# Patient Record
Sex: Male | Born: 1950 | Race: Black or African American | Hispanic: No | Marital: Married | Smoking: Former smoker
Health system: Southern US, Community
[De-identification: ages and names within clinical notes are randomized; demographics above are authoritative.]

## PROBLEM LIST (undated history)

## (undated) DIAGNOSIS — D649 Anemia, unspecified: Secondary | ICD-10-CM

## (undated) DIAGNOSIS — R9431 Abnormal electrocardiogram [ECG] [EKG]: Secondary | ICD-10-CM

## (undated) DIAGNOSIS — K5730 Diverticulosis of large intestine without perforation or abscess without bleeding: Secondary | ICD-10-CM

## (undated) DIAGNOSIS — M722 Plantar fascial fibromatosis: Secondary | ICD-10-CM

## (undated) DIAGNOSIS — F528 Other sexual dysfunction not due to a substance or known physiological condition: Secondary | ICD-10-CM

## (undated) DIAGNOSIS — J189 Pneumonia, unspecified organism: Secondary | ICD-10-CM

## (undated) DIAGNOSIS — J852 Abscess of lung without pneumonia: Secondary | ICD-10-CM

## (undated) DIAGNOSIS — J159 Unspecified bacterial pneumonia: Secondary | ICD-10-CM

## (undated) DIAGNOSIS — R972 Elevated prostate specific antigen [PSA]: Secondary | ICD-10-CM

## (undated) DIAGNOSIS — R079 Chest pain, unspecified: Secondary | ICD-10-CM

## (undated) DIAGNOSIS — I639 Cerebral infarction, unspecified: Secondary | ICD-10-CM

## (undated) DIAGNOSIS — N400 Enlarged prostate without lower urinary tract symptoms: Secondary | ICD-10-CM

## (undated) DIAGNOSIS — I2699 Other pulmonary embolism without acute cor pulmonale: Secondary | ICD-10-CM

## (undated) DIAGNOSIS — K649 Unspecified hemorrhoids: Secondary | ICD-10-CM

## (undated) DIAGNOSIS — J439 Emphysema, unspecified: Secondary | ICD-10-CM

## (undated) DIAGNOSIS — J984 Other disorders of lung: Secondary | ICD-10-CM

## (undated) DIAGNOSIS — I10 Essential (primary) hypertension: Secondary | ICD-10-CM

## (undated) DIAGNOSIS — R109 Unspecified abdominal pain: Secondary | ICD-10-CM

## (undated) DIAGNOSIS — I739 Peripheral vascular disease, unspecified: Secondary | ICD-10-CM

## (undated) DIAGNOSIS — R350 Frequency of micturition: Secondary | ICD-10-CM

## (undated) DIAGNOSIS — R252 Cramp and spasm: Secondary | ICD-10-CM

## (undated) DIAGNOSIS — I2510 Atherosclerotic heart disease of native coronary artery without angina pectoris: Secondary | ICD-10-CM

## (undated) DIAGNOSIS — E785 Hyperlipidemia, unspecified: Secondary | ICD-10-CM

## (undated) DIAGNOSIS — I82409 Acute embolism and thrombosis of unspecified deep veins of unspecified lower extremity: Secondary | ICD-10-CM

## (undated) HISTORY — DX: Atherosclerotic heart disease of native coronary artery without angina pectoris: I25.10

## (undated) HISTORY — DX: Abnormal electrocardiogram (ECG) (EKG): R94.31

## (undated) HISTORY — DX: Elevated prostate specific antigen (PSA): R97.20

## (undated) HISTORY — DX: Frequency of micturition: R35.0

## (undated) HISTORY — DX: Unspecified hemorrhoids: K64.9

## (undated) HISTORY — DX: Hyperlipidemia, unspecified: E78.5

## (undated) HISTORY — DX: Other disorders of lung: J98.4

## (undated) HISTORY — PX: INGUINAL HERNIA REPAIR: SUR1180

## (undated) HISTORY — DX: Peripheral vascular disease, unspecified: I73.9

## (undated) HISTORY — DX: Unspecified bacterial pneumonia: J15.9

## (undated) HISTORY — DX: Chest pain, unspecified: R07.9

## (undated) HISTORY — DX: Cerebral infarction, unspecified: I63.9

## (undated) HISTORY — DX: Benign prostatic hyperplasia without lower urinary tract symptoms: N40.0

## (undated) HISTORY — DX: Anemia, unspecified: D64.9

## (undated) HISTORY — DX: Other sexual dysfunction not due to a substance or known physiological condition: F52.8

## (undated) HISTORY — DX: Diverticulosis of large intestine without perforation or abscess without bleeding: K57.30

## (undated) HISTORY — PX: HEMORRHOID SURGERY: SHX153

## (undated) HISTORY — DX: Unspecified abdominal pain: R10.9

## (undated) HISTORY — DX: Essential (primary) hypertension: I10

## (undated) HISTORY — DX: Abscess of lung without pneumonia: J85.2

## (undated) HISTORY — DX: Cramp and spasm: R25.2

## (undated) HISTORY — PX: COLONOSCOPY: SHX174

## (undated) HISTORY — DX: Plantar fascial fibromatosis: M72.2

---

## 2000-02-10 ENCOUNTER — Ambulatory Visit (HOSPITAL_COMMUNITY): Admission: RE | Admit: 2000-02-10 | Discharge: 2000-02-10 | Payer: Self-pay | Admitting: *Deleted

## 2001-02-18 ENCOUNTER — Ambulatory Visit (HOSPITAL_COMMUNITY): Admission: RE | Admit: 2001-02-18 | Discharge: 2001-02-18 | Payer: Self-pay | Admitting: Internal Medicine

## 2001-02-18 ENCOUNTER — Encounter: Payer: Self-pay | Admitting: Internal Medicine

## 2002-10-06 ENCOUNTER — Encounter: Payer: Self-pay | Admitting: *Deleted

## 2002-10-06 ENCOUNTER — Ambulatory Visit (HOSPITAL_COMMUNITY): Admission: RE | Admit: 2002-10-06 | Discharge: 2002-10-06 | Payer: Self-pay | Admitting: *Deleted

## 2004-01-21 ENCOUNTER — Ambulatory Visit: Payer: Self-pay | Admitting: Cardiology

## 2004-02-05 ENCOUNTER — Ambulatory Visit: Payer: Self-pay | Admitting: Internal Medicine

## 2004-02-24 ENCOUNTER — Encounter
Admission: RE | Admit: 2004-02-24 | Discharge: 2004-05-24 | Payer: Self-pay | Admitting: Physical Medicine & Rehabilitation

## 2004-02-26 ENCOUNTER — Ambulatory Visit: Payer: Self-pay | Admitting: Physical Medicine & Rehabilitation

## 2004-03-21 ENCOUNTER — Emergency Department (HOSPITAL_COMMUNITY): Admission: EM | Admit: 2004-03-21 | Discharge: 2004-03-21 | Payer: Self-pay | Admitting: Emergency Medicine

## 2004-04-05 ENCOUNTER — Ambulatory Visit: Payer: Self-pay | Admitting: Internal Medicine

## 2004-05-03 ENCOUNTER — Ambulatory Visit: Payer: Self-pay | Admitting: Internal Medicine

## 2004-05-10 ENCOUNTER — Ambulatory Visit: Payer: Self-pay | Admitting: Internal Medicine

## 2004-05-31 ENCOUNTER — Ambulatory Visit: Payer: Self-pay | Admitting: Cardiology

## 2004-09-16 ENCOUNTER — Ambulatory Visit: Payer: Self-pay | Admitting: Internal Medicine

## 2004-10-18 ENCOUNTER — Ambulatory Visit: Payer: Self-pay | Admitting: Cardiology

## 2004-11-04 ENCOUNTER — Ambulatory Visit: Payer: Self-pay | Admitting: Gastroenterology

## 2004-11-16 ENCOUNTER — Ambulatory Visit: Payer: Self-pay | Admitting: Gastroenterology

## 2004-11-16 LAB — HM COLONOSCOPY

## 2004-11-23 ENCOUNTER — Ambulatory Visit: Payer: Self-pay | Admitting: Internal Medicine

## 2004-12-20 ENCOUNTER — Ambulatory Visit: Payer: Self-pay | Admitting: Internal Medicine

## 2005-04-19 ENCOUNTER — Ambulatory Visit: Payer: Self-pay | Admitting: Internal Medicine

## 2005-11-08 ENCOUNTER — Ambulatory Visit: Payer: Self-pay | Admitting: Internal Medicine

## 2005-11-14 ENCOUNTER — Ambulatory Visit: Payer: Self-pay | Admitting: Internal Medicine

## 2006-04-17 ENCOUNTER — Ambulatory Visit: Payer: Self-pay | Admitting: Internal Medicine

## 2006-10-23 ENCOUNTER — Encounter: Payer: Self-pay | Admitting: Internal Medicine

## 2006-10-23 DIAGNOSIS — I739 Peripheral vascular disease, unspecified: Secondary | ICD-10-CM

## 2006-10-23 DIAGNOSIS — J852 Abscess of lung without pneumonia: Secondary | ICD-10-CM | POA: Insufficient documentation

## 2006-10-23 DIAGNOSIS — K219 Gastro-esophageal reflux disease without esophagitis: Secondary | ICD-10-CM

## 2006-10-23 DIAGNOSIS — E785 Hyperlipidemia, unspecified: Secondary | ICD-10-CM

## 2006-10-23 DIAGNOSIS — F528 Other sexual dysfunction not due to a substance or known physiological condition: Secondary | ICD-10-CM

## 2006-10-23 DIAGNOSIS — I10 Essential (primary) hypertension: Secondary | ICD-10-CM | POA: Insufficient documentation

## 2006-10-23 DIAGNOSIS — K649 Unspecified hemorrhoids: Secondary | ICD-10-CM

## 2006-10-23 HISTORY — DX: Abscess of lung without pneumonia: J85.2

## 2006-10-23 HISTORY — DX: Unspecified hemorrhoids: K64.9

## 2006-10-23 HISTORY — DX: Hyperlipidemia, unspecified: E78.5

## 2006-10-23 HISTORY — DX: Essential (primary) hypertension: I10

## 2006-10-23 HISTORY — DX: Other sexual dysfunction not due to a substance or known physiological condition: F52.8

## 2006-10-23 HISTORY — DX: Peripheral vascular disease, unspecified: I73.9

## 2006-11-12 ENCOUNTER — Ambulatory Visit: Payer: Self-pay | Admitting: Internal Medicine

## 2006-11-12 LAB — CONVERTED CEMR LAB
ALT: 22 units/L (ref 0–53)
AST: 26 units/L (ref 0–37)
Albumin: 3.4 g/dL — ABNORMAL LOW (ref 3.5–5.2)
Alkaline Phosphatase: 41 units/L (ref 39–117)
BUN: 10 mg/dL (ref 6–23)
Bacteria, UA: NEGATIVE
Basophils Relative: 0.7 % (ref 0.0–1.0)
Chloride: 112 meq/L (ref 96–112)
Creatinine, Ser: 1 mg/dL (ref 0.4–1.5)
Crystals: NEGATIVE
Eosinophils Relative: 2.5 % (ref 0.0–5.0)
HCT: 40.5 % (ref 39.0–52.0)
HDL: 48.5 mg/dL (ref >39.0)
Hemoglobin: 13.9 g/dL (ref 13.0–17.0)
LDL Cholesterol: 121 mg/dL — ABNORMAL HIGH (ref 0–99)
Lymphocytes Relative: 31.6 % (ref 12.0–46.0)
Monocytes Absolute: 0.7 10*3/uL (ref 0.2–0.7)
Monocytes Relative: 9.2 % (ref 3.0–11.0)
Mucus, UA: NEGATIVE
Nitrite: NEGATIVE
RDW: 12.2 % (ref 11.5–14.6)
Specific Gravity, Urine: 1.015 (ref 1.000–1.03)
Total Bilirubin: 1.1 mg/dL (ref 0.3–1.2)
VLDL: 19 mg/dL (ref 0–40)
WBC, UA: NONE SEEN cells/hpf
WBC: 7.2 10*3/uL (ref 4.5–10.5)

## 2006-11-19 ENCOUNTER — Ambulatory Visit: Payer: Self-pay | Admitting: Internal Medicine

## 2007-03-07 ENCOUNTER — Ambulatory Visit: Payer: Self-pay | Admitting: Internal Medicine

## 2007-03-07 DIAGNOSIS — K5730 Diverticulosis of large intestine without perforation or abscess without bleeding: Secondary | ICD-10-CM | POA: Insufficient documentation

## 2007-03-07 HISTORY — DX: Diverticulosis of large intestine without perforation or abscess without bleeding: K57.30

## 2007-07-19 ENCOUNTER — Ambulatory Visit: Payer: Self-pay | Admitting: Internal Medicine

## 2007-07-19 DIAGNOSIS — R252 Cramp and spasm: Secondary | ICD-10-CM

## 2007-07-19 HISTORY — DX: Cramp and spasm: R25.2

## 2007-11-15 ENCOUNTER — Ambulatory Visit: Payer: Self-pay | Admitting: Internal Medicine

## 2007-11-20 ENCOUNTER — Ambulatory Visit: Payer: Self-pay | Admitting: Internal Medicine

## 2007-11-20 DIAGNOSIS — R972 Elevated prostate specific antigen [PSA]: Secondary | ICD-10-CM | POA: Insufficient documentation

## 2007-11-20 HISTORY — DX: Elevated prostate specific antigen (PSA): R97.20

## 2007-11-20 LAB — CONVERTED CEMR LAB
AST: 39 units/L — ABNORMAL HIGH (ref 0–37)
Alkaline Phosphatase: 41 units/L (ref 39–117)
Basophils Absolute: 0 10*3/uL (ref 0.0–0.1)
Bilirubin, Direct: 0.2 mg/dL (ref 0.0–0.3)
Chloride: 110 meq/L (ref 96–112)
Eosinophils Absolute: 0.2 10*3/uL (ref 0.0–0.7)
Eosinophils Relative: 2.3 % (ref 0.0–5.0)
GFR calc non Af Amer: 61 mL/min
HDL: 57.3 mg/dL (ref >39.0)
Hemoglobin, Urine: NEGATIVE
MCV: 101.1 fL — ABNORMAL HIGH (ref 78.0–100.0)
Mucus, UA: NEGATIVE
Neutrophils Relative %: 47.7 % (ref 43.0–77.0)
Nitrite: NEGATIVE
PSA: 2.94 ng/mL (ref 0.10–4.00)
Platelets: 175 10*3/uL (ref 150–400)
Potassium: 4.6 meq/L (ref 3.5–5.1)
TSH: 1.16 microintl units/mL (ref 0.35–5.50)
Total Bilirubin: 1 mg/dL (ref 0.3–1.2)
Total CHOL/HDL Ratio: 2.9
Urine Glucose: NEGATIVE mg/dL
Urobilinogen, UA: 0.2 (ref 0.0–1.0)
VLDL: 47 mg/dL — ABNORMAL HIGH (ref 0–40)
WBC: 7.1 10*3/uL (ref 4.5–10.5)

## 2007-11-28 ENCOUNTER — Ambulatory Visit: Payer: Self-pay | Admitting: Internal Medicine

## 2007-12-11 ENCOUNTER — Encounter: Payer: Self-pay | Admitting: Internal Medicine

## 2007-12-23 ENCOUNTER — Ambulatory Visit: Payer: Self-pay | Admitting: Internal Medicine

## 2007-12-24 ENCOUNTER — Encounter: Payer: Self-pay | Admitting: Internal Medicine

## 2007-12-27 ENCOUNTER — Ambulatory Visit: Payer: Self-pay | Admitting: Internal Medicine

## 2008-04-21 ENCOUNTER — Encounter: Payer: Self-pay | Admitting: Internal Medicine

## 2008-04-21 ENCOUNTER — Ambulatory Visit: Payer: Self-pay | Admitting: Internal Medicine

## 2008-04-27 ENCOUNTER — Ambulatory Visit: Payer: Self-pay | Admitting: Internal Medicine

## 2008-04-27 LAB — CONVERTED CEMR LAB
BUN: 14 mg/dL (ref 6–23)
Creatinine, Ser: 1.3 mg/dL (ref 0.4–1.5)

## 2008-04-29 ENCOUNTER — Ambulatory Visit: Payer: Self-pay | Admitting: Cardiology

## 2008-05-14 ENCOUNTER — Ambulatory Visit: Payer: Self-pay | Admitting: Pulmonary Disease

## 2008-05-14 DIAGNOSIS — R911 Solitary pulmonary nodule: Secondary | ICD-10-CM | POA: Insufficient documentation

## 2008-05-14 DIAGNOSIS — J984 Other disorders of lung: Secondary | ICD-10-CM

## 2008-05-14 HISTORY — DX: Other disorders of lung: J98.4

## 2008-06-05 ENCOUNTER — Ambulatory Visit: Payer: Self-pay | Admitting: Cardiology

## 2008-06-08 ENCOUNTER — Telehealth (INDEPENDENT_AMBULATORY_CARE_PROVIDER_SITE_OTHER): Payer: Self-pay | Admitting: *Deleted

## 2008-06-11 ENCOUNTER — Ambulatory Visit (HOSPITAL_COMMUNITY): Admission: RE | Admit: 2008-06-11 | Discharge: 2008-06-11 | Payer: Self-pay | Admitting: Pulmonary Disease

## 2008-06-12 ENCOUNTER — Ambulatory Visit: Payer: Self-pay | Admitting: Pulmonary Disease

## 2008-10-09 ENCOUNTER — Ambulatory Visit: Payer: Self-pay | Admitting: Cardiovascular Disease

## 2008-10-15 ENCOUNTER — Ambulatory Visit: Payer: Self-pay | Admitting: Pulmonary Disease

## 2008-10-28 ENCOUNTER — Ambulatory Visit: Payer: Self-pay | Admitting: Internal Medicine

## 2008-10-28 LAB — CONVERTED CEMR LAB
ALT: 19 units/L (ref 0–53)
BUN: 15 mg/dL (ref 6–23)
Basophils Relative: 0.3 % (ref 0.0–3.0)
Bilirubin Urine: NEGATIVE
Calcium: 8.8 mg/dL (ref 8.4–10.5)
Eosinophils Relative: 1.6 % (ref 0.0–5.0)
GFR calc non Af Amer: 89 mL/min (ref >60)
Glucose, Bld: 100 mg/dL — ABNORMAL HIGH (ref 70–99)
HCT: 39.5 % (ref 39.0–52.0)
Ketones, ur: NEGATIVE mg/dL
Leukocytes, UA: NEGATIVE
Lymphs Abs: 1.9 10*3/uL (ref 0.7–4.0)
MCV: 100.5 fL — ABNORMAL HIGH (ref 78.0–100.0)
Monocytes Absolute: 0.7 10*3/uL (ref 0.1–1.0)
Neutro Abs: 4 10*3/uL (ref 1.4–7.7)
PSA: 1.08 ng/mL (ref 0.10–4.00)
RBC: 3.93 M/uL — ABNORMAL LOW (ref 4.22–5.81)
Sodium: 139 meq/L (ref 135–145)
TSH: 1.64 microintl units/mL (ref 0.35–5.50)
Total Protein: 6.9 g/dL (ref 6.0–8.3)
Urobilinogen, UA: 1 (ref 0.0–1.0)
WBC: 6.7 10*3/uL (ref 4.5–10.5)
pH: 6 (ref 5.0–8.0)

## 2008-11-02 ENCOUNTER — Ambulatory Visit: Payer: Self-pay | Admitting: Internal Medicine

## 2008-11-02 DIAGNOSIS — R9431 Abnormal electrocardiogram [ECG] [EKG]: Secondary | ICD-10-CM

## 2008-11-02 DIAGNOSIS — M722 Plantar fascial fibromatosis: Secondary | ICD-10-CM | POA: Insufficient documentation

## 2008-11-02 HISTORY — DX: Abnormal electrocardiogram (ECG) (EKG): R94.31

## 2008-11-02 HISTORY — DX: Plantar fascial fibromatosis: M72.2

## 2008-11-13 ENCOUNTER — Encounter: Payer: Self-pay | Admitting: Internal Medicine

## 2008-11-13 ENCOUNTER — Ambulatory Visit: Payer: Self-pay

## 2008-11-19 ENCOUNTER — Telehealth: Payer: Self-pay | Admitting: Internal Medicine

## 2008-11-19 ENCOUNTER — Encounter: Payer: Self-pay | Admitting: Internal Medicine

## 2008-11-19 ENCOUNTER — Ambulatory Visit: Payer: Self-pay | Admitting: Internal Medicine

## 2009-04-11 ENCOUNTER — Encounter: Payer: Self-pay | Admitting: Internal Medicine

## 2009-04-19 ENCOUNTER — Encounter: Payer: Self-pay | Admitting: Pulmonary Disease

## 2009-04-19 ENCOUNTER — Telehealth: Payer: Self-pay | Admitting: Pulmonary Disease

## 2009-05-04 ENCOUNTER — Encounter: Admission: RE | Admit: 2009-05-04 | Discharge: 2009-05-04 | Payer: Self-pay | Admitting: Pulmonary Disease

## 2009-05-12 ENCOUNTER — Ambulatory Visit: Payer: Self-pay | Admitting: Pulmonary Disease

## 2009-05-12 DIAGNOSIS — J449 Chronic obstructive pulmonary disease, unspecified: Secondary | ICD-10-CM

## 2009-06-07 ENCOUNTER — Ambulatory Visit: Payer: Self-pay | Admitting: Internal Medicine

## 2009-11-02 ENCOUNTER — Ambulatory Visit: Payer: Self-pay | Admitting: Internal Medicine

## 2009-11-02 LAB — CONVERTED CEMR LAB
ALT: 15 units/L (ref 0–53)
Basophils Absolute: 0 10*3/uL (ref 0.0–0.1)
Bilirubin Urine: NEGATIVE
Bilirubin, Direct: 0.1 mg/dL (ref 0.0–0.3)
Calcium: 8.5 mg/dL (ref 8.4–10.5)
Chloride: 110 meq/L (ref 96–112)
Cholesterol: 165 mg/dL (ref 0–200)
Creatinine, Ser: 1.1 mg/dL (ref 0.4–1.5)
Eosinophils Absolute: 0.1 10*3/uL (ref 0.0–0.7)
GFR calc non Af Amer: 89.61 mL/min (ref >60)
HDL: 53.7 mg/dL (ref >39.00)
Hemoglobin: 13.3 g/dL (ref 13.0–17.0)
LDL Cholesterol: 83 mg/dL (ref 0–99)
Lymphocytes Relative: 38.7 % (ref 12.0–46.0)
MCHC: 34.2 g/dL (ref 30.0–36.0)
Monocytes Absolute: 0.6 10*3/uL (ref 0.1–1.0)
Neutro Abs: 2.9 10*3/uL (ref 1.4–7.7)
RDW: 13.2 % (ref 11.5–14.6)
Total Bilirubin: 0.7 mg/dL (ref 0.3–1.2)
Total Protein, Urine: NEGATIVE mg/dL
Triglycerides: 141 mg/dL (ref 0.0–149.0)
Urine Glucose: NEGATIVE mg/dL
VLDL: 28.2 mg/dL (ref 0.0–40.0)
pH: 6 (ref 5.0–8.0)

## 2009-11-08 ENCOUNTER — Encounter: Payer: Self-pay | Admitting: Internal Medicine

## 2009-11-08 ENCOUNTER — Ambulatory Visit: Payer: Self-pay | Admitting: Internal Medicine

## 2009-11-18 ENCOUNTER — Encounter: Payer: Self-pay | Admitting: Internal Medicine

## 2009-11-19 ENCOUNTER — Ambulatory Visit: Payer: Self-pay

## 2009-11-19 ENCOUNTER — Encounter: Payer: Self-pay | Admitting: Internal Medicine

## 2009-11-23 ENCOUNTER — Encounter: Payer: Self-pay | Admitting: Internal Medicine

## 2009-12-09 ENCOUNTER — Encounter: Payer: Self-pay | Admitting: Internal Medicine

## 2009-12-09 ENCOUNTER — Ambulatory Visit: Payer: Self-pay | Admitting: Vascular Surgery

## 2009-12-24 ENCOUNTER — Encounter: Payer: Self-pay | Admitting: Internal Medicine

## 2009-12-24 ENCOUNTER — Ambulatory Visit: Payer: Self-pay | Admitting: Internal Medicine

## 2009-12-24 DIAGNOSIS — R6883 Chills (without fever): Secondary | ICD-10-CM | POA: Insufficient documentation

## 2009-12-24 DIAGNOSIS — R079 Chest pain, unspecified: Secondary | ICD-10-CM

## 2009-12-24 DIAGNOSIS — R350 Frequency of micturition: Secondary | ICD-10-CM

## 2009-12-24 HISTORY — DX: Frequency of micturition: R35.0

## 2009-12-24 HISTORY — DX: Chest pain, unspecified: R07.9

## 2009-12-24 LAB — CONVERTED CEMR LAB
ALT: 17 units/L (ref 0–53)
AST: 24 units/L (ref 0–37)
Alkaline Phosphatase: 46 units/L (ref 39–117)
BUN: 20 mg/dL (ref 6–23)
Basophils Relative: 0.8 % (ref 0.0–3.0)
Bilirubin, Direct: 0.2 mg/dL (ref 0.0–0.3)
Chloride: 105 meq/L (ref 96–112)
Eosinophils Absolute: 0.1 10*3/uL (ref 0.0–0.7)
GFR calc non Af Amer: 90.53 mL/min (ref >60)
Ketones, ur: NEGATIVE mg/dL
Leukocytes, UA: NEGATIVE
Lymphocytes Relative: 19.4 % (ref 12.0–46.0)
MCHC: 34.7 g/dL (ref 30.0–36.0)
MCV: 98.7 fL (ref 78.0–100.0)
Monocytes Absolute: 1 10*3/uL (ref 0.1–1.0)
Neutrophils Relative %: 69 % (ref 43.0–77.0)
Platelets: 128 10*3/uL — ABNORMAL LOW (ref 150.0–400.0)
Potassium: 3.8 meq/L (ref 3.5–5.1)
RBC: 3.72 M/uL — ABNORMAL LOW (ref 4.22–5.81)
Sodium: 139 meq/L (ref 135–145)
Specific Gravity, Urine: 1.015 (ref 1.000–1.030)
Total Bilirubin: 0.7 mg/dL (ref 0.3–1.2)
Urine Glucose: NEGATIVE mg/dL
WBC: 9.8 10*3/uL (ref 4.5–10.5)
pH: 6 (ref 5.0–8.0)

## 2009-12-28 ENCOUNTER — Ambulatory Visit: Payer: Self-pay | Admitting: Internal Medicine

## 2009-12-28 ENCOUNTER — Telehealth: Payer: Self-pay | Admitting: Internal Medicine

## 2009-12-28 DIAGNOSIS — J159 Unspecified bacterial pneumonia: Secondary | ICD-10-CM | POA: Insufficient documentation

## 2009-12-28 HISTORY — DX: Unspecified bacterial pneumonia: J15.9

## 2010-01-03 ENCOUNTER — Ambulatory Visit: Payer: Self-pay | Admitting: Internal Medicine

## 2010-01-10 ENCOUNTER — Ambulatory Visit: Payer: Self-pay | Admitting: Internal Medicine

## 2010-01-17 ENCOUNTER — Encounter: Payer: Self-pay | Admitting: Internal Medicine

## 2010-01-17 ENCOUNTER — Ambulatory Visit: Payer: Self-pay | Admitting: Internal Medicine

## 2010-02-09 ENCOUNTER — Ambulatory Visit: Payer: Self-pay | Admitting: Pulmonary Disease

## 2010-02-25 ENCOUNTER — Ambulatory Visit: Admit: 2010-02-25 | Payer: Self-pay | Admitting: Pulmonary Disease

## 2010-02-25 ENCOUNTER — Encounter: Payer: Self-pay | Admitting: Internal Medicine

## 2010-02-25 ENCOUNTER — Ambulatory Visit
Admission: RE | Admit: 2010-02-25 | Discharge: 2010-02-25 | Payer: Self-pay | Source: Home / Self Care | Attending: Internal Medicine | Admitting: Internal Medicine

## 2010-02-25 ENCOUNTER — Other Ambulatory Visit: Payer: Self-pay | Admitting: Internal Medicine

## 2010-02-25 DIAGNOSIS — R109 Unspecified abdominal pain: Secondary | ICD-10-CM | POA: Insufficient documentation

## 2010-02-25 HISTORY — DX: Unspecified abdominal pain: R10.9

## 2010-02-25 LAB — URINALYSIS, ROUTINE W REFLEX MICROSCOPIC
Bilirubin Urine: NEGATIVE
Ketones, ur: NEGATIVE
Leukocytes, UA: NEGATIVE
Nitrite: NEGATIVE
Specific Gravity, Urine: 1.01 (ref 1.000–1.030)
Total Protein, Urine: NEGATIVE
Urine Glucose: NEGATIVE
Urobilinogen, UA: 0.2 (ref 0.0–1.0)
pH: 6.5 (ref 5.0–8.0)

## 2010-03-22 NOTE — Miscellaneous (Signed)
Summary: Orders Update  Clinical Lists Changes  Orders: Added new Test order of Arterial Duplex Lower Extremity (Arterial Duplex Low) - Signed 

## 2010-03-22 NOTE — Consult Note (Signed)
Summary: [facility]   Imported By: Lennie Odor 12/29/2009 12:02:15  _____________________________________________________________________  External Attachment:    Type:   Image     Comment:   External Document

## 2010-03-22 NOTE — Assessment & Plan Note (Signed)
Summary: LEG CRAMPS/NWS   #   Vital Signs:  Patient profile:   60 year old male Height:      76 inches Weight:      166.50 pounds BMI:     20.34 O2 Sat:      99 % on Room air Temp:     97.8 degrees F oral Pulse rate:   64 / minute BP sitting:   112 / 74  (left arm) Cuff size:   regular  Vitals Entered ByZella Ball Ewing (June 07, 2009 10:18 AM)  O2 Flow:  Room air CC: Cramps in both legs for 5 days/RE   Primary Care Provider:  Dr. Jonny Ruiz  CC:  Cramps in both legs for 5 days/RE.  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  Pt denies CP, sob, doe, wheezing, orthopnea, pnd, worsening LE edema, palps, dizziness or syncope   Pt denies new neuro symptoms such as headache, facial or extremity weakness  Had recent CT with coronary calcifications  but asympt, also asympt slight emphysema.   no recent new meds, and has been on the crestor for a long time.   Admits to some dietary noncomplaince on occasion with cholesterol.   Problems Prior to Update: 1)  C O P D  (ICD-496) 2)  Plantar Fasciitis, Left  (ICD-728.71) 3)  Abnormal Electrocardiogram  (ICD-794.31) 4)  Preventive Health Care  (ICD-V70.0) 5)  Pulmonary Nodule  (ICD-518.89) 6)  Swelling, Mass, or Lump in Chest  (ICD-786.6) 7)  Chest Pain  (ICD-786.50) 8)  Psa, Increased  (ICD-790.93) 9)  Preventive Health Care  (ICD-V70.0) 10)  Cramp of Limb  (ICD-729.82) 11)  Diverticulosis,  Colon  (ICD-562.10) 12)  Hemorrhoids  (ICD-455.6) 13)  Erectile Dysfunction  (ICD-302.72) 14)  Abscess, Lung  (ICD-513.0) 15)  Peripheral Vascular Disease  (ICD-443.9) 16)  Hypertension  (ICD-401.9) 17)  Hyperlipidemia  (ICD-272.4) 18)  Gerd  (ICD-530.81)  Medications Prior to Update: 1)  Zetia 10 Mg  Tabs (Ezetimibe) .Marland Kitchen.. 1 By Mouth Once Daily 2)  Lisinopril 20 Mg Tabs (Lisinopril) .... 2 By Mouth Q Am 3)  Ecotrin 325 Mg  Tbec (Aspirin) .Marland Kitchen.. 1 By Mouth Qd 4)  Crestor 40 Mg Tabs (Rosuvastatin Calcium) .... 1/2 Po Once Daily 5)  Viagra 100 Mg Tabs (Sildenafil Citrate) .Marland Kitchen.. 1 By Mouth Daily As Needed 6)  Amlodipine Besylate 10 Mg Tabs (Amlodipine Besylate) .Marland Kitchen.. 1 By Mouth Once Daily  Current Medications (verified): 1)  Zetia 10 Mg  Tabs (Ezetimibe) .Marland Kitchen.. 1 By Mouth Once Daily 2)  Lisinopril 20 Mg Tabs (Lisinopril) .... 2 By Mouth Q Am 3)  Ecotrin 325 Mg  Tbec (Aspirin) .Marland Kitchen.. 1 By Mouth Qd 4)  Crestor 40 Mg Tabs (Rosuvastatin Calcium) .... 1/2 Po Once Daily 5)  Viagra 100  Mg Tabs (Sildenafil Citrate) .Marland Kitchen.. 1 By Mouth Daily As Needed 6)  Amlodipine Besylate 10 Mg Tabs (Amlodipine Besylate) .Marland Kitchen.. 1 By Mouth Once Daily 7)  Flexeril 5 Mg Tabs (Cyclobenzaprine Hcl) .Marland Kitchen.. 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 hx of lung abscess cardiomyopathy secondary to HTN coronary calcifications by CT march 2011 diastolic dysfunction by echo sept 2010 Emphysema by CT /COPD  Past Surgical History: Reviewed history from 03/07/2007 and no changes required. Hemorrhoidectomy Inguinal  herniorrhaphy  Review of Systems       all otherwise negative per pt -    Physical Exam  General:  alert and well-developed., thin appearing for height Head:  normocephalic and atraumatic.   Eyes:  vision grossly intact, pupils equal, and pupils round.   Ears:  R ear normal and L ear normal.   Nose:  no external deformity and no nasal discharge.   Mouth:  no gingival abnormalities and pharynx pink and moist.   Neck:  supple and no masses.   Lungs:  normal respiratory effort and normal breath sounds.   Heart:  normal rate and regular rhythm.   Abdomen:  soft, non-tender, and normal bowel sounds.   Msk:  no joint tenderness and no joint swelling.   Pulses:  trace to 1+ bilat dorsalis pedis Extremities:  no edema, no erythema    Impression & Recommendations:  Problem # 1:  MUSCLE CRAMPS (ICD-729.82) most likely due to work exertion it seems, to try not to overexert, try b complex vitamin, consider change amlod to diltiazem, also for muscle relaxer as needed   Problem # 2:  PERIPHERAL VASCULAR DISEASE (ICD-443.9) with stable claudication; Continue all previous medications as before this visit   Problem # 3:  HYPERTENSION (ICD-401.9)  His updated medication list for this problem includes:    Lisinopril 20 Mg Tabs (Lisinopril) .Marland Kitchen... 2 by mouth q am    Amlodipine Besylate 10 Mg Tabs (Amlodipine besylate) .Marland Kitchen... 1 by mouth once daily  BP today: 112/74 Prior BP: 144/86 (05/12/2009)  Labs Reviewed: K+: 4.9 (10/28/2008) Creat: : 1.1 (10/28/2008)   Chol: 194 (10/28/2008)   HDL: 54.10 (10/28/2008)   LDL: 116 (10/28/2008)   TG: 118.0 (10/28/2008) stable overall by hx and exam, ok to continue meds/tx as is   Problem # 4:  HYPERLIPIDEMIA (ICD-272.4)  His updated medication list for this problem includes:    Zetia 10 Mg Tabs (Ezetimibe) .Marland Kitchen... 1 by mouth once daily    Crestor 40 Mg Tabs (Rosuvastatin calcium) .Marland Kitchen... 1/2 po once daily overall good  compliance with meds; to cont meds as  is for now, consider increase in light of the PVD and cor ca+2 but declines if there is any chance of making the cramps worse; to work on better diet for now;  goal ldl < 70  Complete Medication List: 1)  Zetia 10 Mg Tabs (Ezetimibe) .Marland Kitchen.. 1 by mouth once daily 2)  Lisinopril 20 Mg Tabs (Lisinopril) .... 2 by mouth q am 3)  Ecotrin 325 Mg Tbec (Aspirin) .Marland Kitchen.. 1 by mouth qd 4)  Crestor 40 Mg Tabs (Rosuvastatin calcium) .... 1/2 po once daily 5)  Viagra 100 Mg Tabs (Sildenafil  citrate) .Marland Kitchen.. 1 by mouth daily as needed 6)  Amlodipine Besylate 10 Mg Tabs (Amlodipine besylate) .Marland Kitchen.. 1 by mouth once daily 7)  Flexeril 5 Mg Tabs (Cyclobenzaprine hcl) .Marland Kitchen.. 1po three times a day as needed  Patient Instructions: 1)  start the b complex vitamin 2)  Please take all new medications as prescribed  - the muscle relaxer 3)  if the cramps persist, we can consider changing the amloidipine to diltiazem 4)  Continue all previous medications as before this visit  5)  please return if your leg pain with walking gets worse, for the leg circulation test 6)  please follow better lower cholesterol diet 7)  Please schedule a follow-up appointment in Sept 2011 with [facility] labs Prescriptions: FLEXERIL 5 MG TABS (CYCLOBENZAPRINE HCL) 1po three times a day as needed  #60 x 0   Entered and Authorized by:   Corwin Levins MD   Signed by:   Corwin Levins MD on 06/07/2009   Method used:   Print then Give to Patient   RxID:   [id number]

## 2010-03-22 NOTE — Assessment & Plan Note (Signed)
Summary: [facility] / # Natale Milch   Vital Signs:  Patient profile:   60 year old male Height:      76 inches Weight:      168.13 pounds BMI:     20.54 O2 Sat:      98 % on Room air Temp:     97.8 degrees F oral Pulse rate:   77 / minute BP sitting:   100 / 58  (left arm) Cuff size:   regular  Vitals Entered By: Zella Ball Ewing CMA Duncan Dull) (November 08, 2009 8:37 AM)  O2 Flow:  Room air  CC: Adult Physical/RE   Primary Care Provider:  Dr. Jonny Ruiz  CC:  Adult Physical/RE.  History of Present Illness: overall doing well;  Pt denies CP, worsening sob, doe, wheezing, orthopnea, pnd, worsening LE edema, palps, dizziness or syncope  Pt denies new neuro symptoms such as headache, facial or extremity weakness  .Pt denies polydipsia, polyuria.  Overall good compliance with meds, trying to follow low chol, DM diet, wt stable, little excercise however.  Has stable bilat leg aching iwth known PVD . - overall stable - still walking 300 yrds before has to sit 30sec to let the flow come back.  Last doppelrs approx 4 yrs - is willing to repeat.    Problems Prior to Update: 1)  Muscle Cramps  (ICD-729.82) 2)  COPD  (ICD-496) 3)  C O P D  (ICD-496) 4)  Plantar Fasciitis, Left  (ICD-728.71) 5)  Abnormal Electrocardiogram  (ICD-794.31) 6)  Preventive Health Care  (ICD-V70.0) 7)  Pulmonary Nodule  (ICD-518.89) 8)  Swelling, Mass, or Lump in Chest  (ICD-786.6) 9)  Chest Pain  (ICD-786.50) 10)  Psa, Increased  (ICD-790.93) 11)  Preventive Health Care  (ICD-V70.0) 12)  Cramp of Limb  (ICD-729.82) 13)  Diverticulosis, Colon  (ICD-562.10) 14)  Hemorrhoids  (ICD-455.6) 15)  Erectile Dysfunction  (ICD-302.72) 16)  Abscess, Lung  (ICD-513.0) 17)  Peripheral Vascular Disease  (ICD-443.9) 18)  Hypertension  (ICD-401.9) 19)  Hyperlipidemia  (ICD-272.4) 20)  Gerd  (ICD-530.81)  Medications Prior to Update: 1)  Zetia 10 Mg  Tabs (Ezetimibe) .Marland Kitchen.. 1 By Mouth Once Daily 2)  Lisinopril 20 Mg Tabs (Lisinopril) .... 2 By  Mouth Q Am 3)  Ecotrin 325 Mg  Tbec (Aspirin) .Marland Kitchen.. 1 By Mouth Qd 4)  Crestor 40 Mg Tabs (Rosuvastatin Calcium) .... 1/2 Po Once Daily 5)  Viagra 100 Mg Tabs (Sildenafil Citrate) .Marland Kitchen.. 1 By Mouth Daily As Needed 6)  Amlodipine Besylate 10 Mg Tabs (Amlodipine Besylate) .Marland Kitchen.. 1 By Mouth Once Daily 7)  Flexeril 5 Mg Tabs (Cyclobenzaprine Hcl) .Marland Kitchen.. 1po Three Times A Day As Needed  Current Medications (verified): 1)  Zetia 10 Mg  Tabs (Ezetimibe) .Marland Kitchen.. 1 By Mouth Once Daily 2)  Lisinopril 20 Mg Tabs (Lisinopril) .... 2 By Mouth Q Am 3)  Ecotrin 325 Mg  Tbec (Aspirin) .Marland Kitchen.. 1 By Mouth Qd 4)  Crestor 20 Mg Tabs (Rosuvastatin Calcium) .Marland Kitchen.. 1 By Mouth Once Daily 5)  Viagra 100 Mg Tabs (Sildenafil Citrate) .Marland Kitchen.. 1 By Mouth Daily As Needed 6)  Amlodipine Besylate 10 Mg Tabs (Amlodipine Besylate) .Marland Kitchen.. 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 hx of lung abscess cardiomyopathy secondary to HTN coronary calcifications by CT march 2011 diastolic dysfunction by echo sept 2010 Emphysema by CT /COPD  Family History: Reviewed history from 03/07/2007 and no changes required. sister with renal transplant  Social History: Reviewed history from 03/07/2007 and no changes required. 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         all otherwise negative per pt -    Physical Exam  General:  alert and well-developed.   Head:  normocephalic and atraumatic.   Eyes:  vision grossly intact, pupils equal, and pupils round.   Ears:  R ear normal and L ear normal.   Nose:  no external deformity and no nasal discharge.   Mouth:  no gingival abnormalities and pharynx pink and moist.   Neck:  supple and no masses.   Lungs:  normal respiratory effort, R decreased breath sounds, and L decreased breath sounds.   Heart:  normal rate and regular rhythm.   Abdomen:  soft, non-tender, and normal bowel sounds.   Msk:  no joint tenderness and no joint swelling.   Pulses:  trace dorsalis pedis bilat at best Extremities:  no edema, no erythema  Neurologic:  cranial nerves II-XII intact and strength normal in all extremities.   Skin:  color normal and no rashes.   Psych:  not anxious appearing and not depressed appearing.     Impression & Recommendations:  Problem # 1:  Preventive Health Care (ICD-V70.0) Overall doing well, age appropriate education and counseling updated and referral for appropriate preventive services done unless declined, immunizations up to date or declined, diet counseling done if overweight, urged to quit smoking if smokes , most recent labs reviewed and current ordered if appropriate, ecg reviewed or declined (interpretation per ECG scanned in the EMR if done); information regarding Medicare Prevention requirements given if appropriate; speciality referrals updated as appropriate ;  declines flu shot today  Problem # 2:  CLAUDICATION (ICD-443.9)  due for repeat LE dopplers  Orders: Radiology Referral (Radiology)  Problem # 3:  HYPERTENSION (ICD-401.9)  His updated medication list for this problem includes:    Lisinopril 20 Mg Tabs (Lisinopril) .Marland Kitchen... 2 by mouth q am    Amlodipine Besylate 10 Mg Tabs (Amlodipine besylate) .Marland Kitchen... 1 by mouth once daily  BP today: 100/58 Prior BP: 112/74 (06/07/2009)  Labs Reviewed: K+: 4.8 (11/02/2009) Creat: : 1.1 (11/02/2009)   Chol: 165 (11/02/2009)   HDL: 53.70 (11/02/2009)   LDL: 83 (11/02/2009)   TG: 141.0 (11/02/2009) stable overall by hx and exam, ok to continue meds/tx as is   Problem # 4:  HYPERLIPIDEMIA (ICD-272.4)  His updated medication list for this problem includes:    Zetia 10 Mg Tabs (Ezetimibe) .Marland Kitchen... 1 by mouth once daily    Crestor 20 Mg Tabs (Rosuvastatin calcium) .Marland Kitchen... 1 by mouth once daily  Labs Reviewed: SGOT: 18 (11/02/2009)   SGPT: 15 (11/02/2009)   HDL:53.70 (11/02/2009),  54.10 (10/28/2008)  LDL:83 (11/02/2009), 116 (25/95/6387)  Chol:165 (11/02/2009), 194 (10/28/2008)  Trig:141.0 (11/02/2009), 118.0 (10/28/2008) improved from previous with better diet and cont'd good med compliacne - Continue all previous medications as before this visit   Complete Medication List: 1)  Zetia 10 Mg Tabs (Ezetimibe) .Marland Kitchen.. 1 by mouth once daily 2)  Lisinopril 20 Mg Tabs (Lisinopril) .... 2 by mouth q am 3)  Ecotrin 325 Mg Tbec (Aspirin) .Marland Kitchen.. 1 by mouth qd 4)  Crestor 20 Mg Tabs (Rosuvastatin calcium) .Marland Kitchen.. 1 by mouth once daily 5)  Viagra 100 Mg Tabs (Sildenafil citrate) .Marland Kitchen.. 1 by mouth daily as needed 6)  Amlodipine Besylate 10 Mg Tabs (Amlodipine besylate) .Marland Kitchen.. 1 by mouth once daily  Patient Instructions: 1)  You will be contacted about the referral(s) to: leg dopplers  2)  please remember to followup with Dr Vassie Loll in march 2012 for the abnormal CT scan 3)  Continue all previous medications as before this visit  4)   Please schedule a follow-up appointment in 1 year or sooner if needed Prescriptions: AMLODIPINE BESYLATE 10 MG TABS (AMLODIPINE BESYLATE) 1 by mouth once daily  #90 x 3   Entered and Authorized by:   Corwin Levins MD   Signed by:   Corwin Levins MD on 11/08/2009   Method used:   Electronically to        [facility]  [address] [phone]* (retail)       [address]       [place], [place]  [id number]       Ph: [phone] or [phone]       Fax: [phone]   RxID:   [id number] VIAGRA 100 MG TABS (SILDENAFIL CITRATE) 1 by mouth daily as needed  #6 x 11   Entered and Authorized by:   Corwin Levins MD   Signed by:   Corwin Levins MD on 11/08/2009   Method used:   Electronically to        [facility] [phone]* (retail)       [address]       [place], [place]  [address]       Ph: [phone] or [phone]       Fax: [phone]   RxID:   [id number] CRESTOR 20 MG TABS (ROSUVASTATIN CALCIUM) 1 by mouth once daily  #90 x 3   Entered and Authorized by:   Corwin Levins MD   Signed by:   Corwin Levins MD on 11/08/2009   Method used:   [facility] to        [facility] [phone]* (retail)       [address]       [place], [place]  [address]       Ph: [phone] or [phone]       Fax: [phone]   RxID:   [id number] LISINOPRIL 20 MG TABS (LISINOPRIL) 2 by mouth q am  #180 x 3   Entered and Authorized by:   Corwin Levins MD   Signed by:   Corwin Levins MD on 11/08/2009   Method used:   [facility]ically to        [facility] [phone]* (retail)       [address]  [place], [place]  [address]       Ph: [phone] or [phone]       Fax: [phone]   RxID:   [id number] ZETIA 10 MG  TABS (EZETIMIBE) 1 by mouth once daily  #90 x 3   Entered and Authorized by:   Corwin Levins MD   Signed by:   Corwin Levins MD on  11/08/2009   Method used:   [facility] to        [facility] [phone]* (retail)       [address]       [place], [place]  [address]       Ph: [phone] or [phone]       Fax: [phone]   RxID:   [id number]

## 2010-03-22 NOTE — Letter (Signed)
Summary: [facility] Insurance Authorization Notification  [facility] Insurance Authorization Notification   Imported By: Roderic Ovens 05/19/2009 12:44:38  _____________________________________________________________________  External Attachment:    Type:   Image     Comment:   External Document

## 2010-03-22 NOTE — Progress Notes (Signed)
Summary: Rx re-sent  Phone Note Refill Request Message from:  Patient on December 28, 2009 1:15 PM  Refills Requested: Medication #1:  ZETIA 10 MG  TABS 1 by mouth once daily   Dosage confirmed as above?Dosage Confirmed   Supply Requested: 1 year  Medication #2:  AMLODIPINE BESYLATE 10 MG TABS 1 by mouth once daily   Dosage confirmed as above?Dosage Confirmed   Supply Requested: 1 year Pt called stating that pharmacy did not recieve RXs sent at [facility]   Method Requested: Electronic Initial call taken by: Margaret Pyle, CMA,  December 28, 2009 1:16 PM    Prescriptions: AMLODIPINE BESYLATE 10 MG TABS (AMLODIPINE BESYLATE) 1 by mouth once daily  #90 x 3   Entered by:   Margaret Pyle, CMA   Authorized by:   Corwin Levins MD   Signed by:   Margaret Pyle, CMA on 12/28/2009   Method used:   [facility]ronically to        [facility]  [address] [phone]* (retail)       [address]       [place], [place]  [id number]       Ph: [phone] or [phone]       Fax: [phone]   RxID:   [id number] ZETIA 10 MG  TABS (EZETIMIBE) 1 by mouth once daily  #90 x 3   Entered by:   Margaret Pyle, CMA   Authorized by:   Corwin Levins MD   Signed by:   Margaret Pyle, CMA on 12/28/2009   Method used:   [facility]ronically to        [facility] [phone]* (retail)       [address]       [place], [place]  [address]       Ph: [phone] or [phone]       Fax: [phone]   RxID:   [id number]

## 2010-03-22 NOTE — Assessment & Plan Note (Signed)
Summary: 1 wk f/u #/cd   Vital Signs:  Patient profile:   60 year old male Height:      76 inches Weight:      166.38 pounds BMI:     20.33 O2 Sat:      97 % on Room air Temp:     98 degrees F oral Pulse rate:   89 / minute BP sitting:   118 / 62  (left arm) Cuff size:   regular  Vitals Entered By: Zella Ball Ewing CMA Duncan Dull) (January 03, 2010 10:27 AM)  O2 Flow:  Room air CC: 1 week followup/RE   Primary Care Provider:  Dr. Jonny Ruiz  CC:  1 week followup/RE.  History of Present Illness: here to f/u recnet pna, finished antibx and overall much improved except for mild fatigue and mild non prod cough;  denies headache, ST, fever;  still has mild left lateral chest discomfort.  No fever, wt loss, night sweats, loss of appetite or other constitutional symptoms Pt denies  worsening sob, doe, wheezing, orthopnea, pnd, worsening LE edema, palps, dizziness or syncope Pt denies new neuro symptoms such as headache, facial or extremity weakness  Denies polydipsia, polyuria.  No new complaints.    Problems Prior to Update: 1)  Bacterial Pneumonia  (ICD-482.9) 2)  Frequency, Urinary  (ICD-788.41) 3)  Chest Pain  (ICD-786.50) 4)  Chills Without Fever  (ICD-780.64) 5)  Claudication  (ICD-443.9) 6)  Muscle Cramps  (ICD-729.82) 7)  COPD  (ICD-496) 8)  C O P D  (ICD-496) 9)  Plantar Fasciitis, Left  (ICD-728.71) 10)  Abnormal Electrocardiogram  (ICD-794.31) 11)  Preventive Health Care  (ICD-V70.0) 12)  Pulmonary Nodule  (ICD-518.89) 13)  Swelling, Mass, or Lump in Chest  (ICD-786.6) 14)  Chest Pain  (ICD-786.50) 15)  Psa, Increased  (ICD-790.93) 16)  Preventive Health Care  (ICD-V70.0) 17)  Cramp of Limb  (ICD-729.82) 18)  Diverticulosis, Colon  (ICD-562.10) 19)  Hemorrhoids  (ICD-455.6) 20)  Erectile Dysfunction  (ICD-302.72) 21)  Abscess, Lung  (ICD-513.0) 22)  Peripheral Vascular Disease  (ICD-443.9) 23)  Hypertension  (ICD-401.9) 24)  Hyperlipidemia  (ICD-272.4) 25)  Gerd   (ICD-530.81)  Medications Prior to Update: 1)  Zetia 10 Mg  Tabs (Ezetimibe) .Marland Kitchen.. 1 By Mouth Once Daily 2)  Lisinopril 20 Mg Tabs (Lisinopril) .... 2 By Mouth Q Am 3)  Ecotrin 325 Mg  Tbec (Aspirin) .Marland Kitchen.. 1 By Mouth Qd 4)  Crestor 20 Mg Tabs (Rosuvastatin Calcium) .Marland Kitchen.. 1 By Mouth Once Daily 5)  Viagra 100 Mg Tabs (Sildenafil Citrate) .Marland Kitchen.. 1 By Mouth Daily As Needed 6)  Amlodipine Besylate 10 Mg Tabs (Amlodipine Besylate) .Marland Kitchen.. 1 By Mouth Once Daily 7)  Levofloxacin 500 Mg Tabs (Levofloxacin) .Marland Kitchen.. 1po Once Daily  Current Medications (verified): 1)  Zetia 10 Mg  Tabs (Ezetimibe) .Marland Kitchen.. 1 By Mouth Once Daily 2)  Lisinopril 20 Mg Tabs (Lisinopril) .... 2 By Mouth Q Am 3)  Ecotrin 325 Mg  Tbec (Aspirin) .Marland Kitchen.. 1 By Mouth Qd 4)  Crestor 20 Mg Tabs (Rosuvastatin Calcium) .Marland Kitchen.. 1 By Mouth Once Daily 5)  Viagra 100 Mg Tabs (Sildenafil Citrate) .Marland Kitchen.. 1 By Mouth Daily As Needed 6)  Amlodipine Besylate 10 Mg Tabs (Amlodipine Besylate) .Marland Kitchen.. 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  , much less ill appearing Head:  normocephalic and atraumatic.   Eyes:  vision grossly  intact, pupils equal, and pupils round.   Ears:  R ear normal and L ear normal.   Nose:  no external deformity and no nasal discharge.   Mouth:  no gingival abnormalities and pharynx pink and moist.   Neck:  supple and no masses.   Lungs:  normal respiratory effort and normal breath sounds.   Heart:  regular rhythm and no murmur.   Extremities:  no edema, no erythema    Impression & Recommendations:  Problem # 1:  BACTERIAL PNEUMONIA (ICD-482.9)  The following medications were removed from the medication list:    Levofloxacin 500 Mg Tabs (Levofloxacin) .Marland Kitchen... 1po once daily  Orders: T-2 View CXR, Same Day (71020.5TC)  for delsym otc for cough, overall improved, for cxr f/u  Problem # 2:  COPD (ICD-496) o/w stable overall by hx and exam, ok to continue meds/tx as is   Problem # 3:  HYPERTENSION (ICD-401.9)  His updated medication list for this problem includes:    Lisinopril 20 Mg Tabs (Lisinopril) .Marland Kitchen... 2 by mouth q am    Amlodipine Besylate 10 Mg Tabs (Amlodipine besylate) .Marland Kitchen... 1 by mouth once daily  BP today: 118/62 Prior BP: 110/68 (12/24/2009)  Labs Reviewed: K+: 3.8 (12/24/2009) Creat: : 1.1 (12/24/2009)   Chol: 165 (11/02/2009)   HDL: 53.70 (11/02/2009)   LDL: 83 (11/02/2009)   TG: 141.0 (11/02/2009) stable overall by hx and exam, ok to continue meds/tx as is   Complete Medication List: 1)  Zetia 10 Mg Tabs (Ezetimibe) .Marland Kitchen.. 1 by mouth once daily 2)  Lisinopril 20 Mg Tabs (Lisinopril) .... 2 by mouth q am 3)  Ecotrin 325 Mg Tbec (Aspirin) .Marland Kitchen.. 1 by mouth qd 4)  Crestor 20 Mg Tabs (Rosuvastatin calcium) .Marland Kitchen.. 1 by mouth once daily 5)  Viagra 100 Mg Tabs (Sildenafil citrate) .Marland Kitchen.. 1 by mouth daily as needed 6)  Amlodipine Besylate 10 Mg Tabs (Amlodipine besylate) .Marland Kitchen.. 1 by mouth once daily  Patient Instructions: 1)  Continue all previous medications as before this visit 2)  Please go to Radiology in the basement level for your X-Ray today  3)  You can also use  Delsym OTC or it's generic for cough 4)  Please schedule a follow-up appointment as needed.   Orders Added: 1)  T-2 View CXR, Same Day [71020.5TC] 2)  Est. Patient Level IV [[address]]

## 2010-03-22 NOTE — Assessment & Plan Note (Signed)
Summary: DISCOMFORT IN UPPER BACK WITH DEEP BREATHES-LB   Vital Signs:  Patient profile:   60 year old male Height:      76 inches Weight:      168.50 pounds BMI:     20.58 O2 Sat:      98 % on Room air Temp:     97.2 degrees F oral Pulse rate:   75 / minute BP sitting:   110 / 68  (left arm) Cuff size:   regular  Vitals Entered By: Zella Ball Ewing CMA Duncan Dull) (December 24, 2009 2:42 PM)  O2 Flow:  Room air CC: Discomfort  on left side of back when breathing and coughing, urinating more frequently/RE   Primary Care Provider:  Dr. Jonny Ruiz  CC:  Discomfort  on left side of back when breathing and coughing and urinating more frequently/RE.  History of Present Illness: here with acute illness  - 3-4  days mild to mod grad worsening ? low grade fever , but also chills, general weakness and malaise, cough nonprod but also left lateral CP, midl  to mod, pleuritic, nonexeritional, assoc with feeling cold of and on, occas nausea, and sob/mild doe; no diaphoresis, dizziness, palp or syncope.  Pt denies other CP,  wheezing, orthopnea, pnd, worsening LE edema.  Trying to drink more fluids, urinating upt o 7 times at night , no hx of DM, denies dysuria, urgency, hematuria, flank pain, abd pain , change in bowels or blood.  Denies worsening depressive symptoms, suicidal ideation, or panic.  Has hx of lung abscess.    Preventive Screening-Counseling & Management      Drug Use:  no.    Problems Prior to Update: 1)  Frequency, Urinary  (ICD-788.41) 2)  Chest Pain  (ICD-786.50) 3)  Chills Without Fever  (ICD-780.64) 4)  Claudication  (ICD-443.9) 5)  Muscle Cramps  (ICD-729.82) 6)  COPD  (ICD-496) 7)  C O P D  (ICD-496) 8)  Plantar Fasciitis, Left  (ICD-728.71) 9)  Abnormal Electrocardiogram  (ICD-794.31) 10)  Preventive Health Care  (ICD-V70.0) 11)  Pulmonary Nodule  (ICD-518.89) 12)  Swelling, Mass, or Lump in Chest  (ICD-786.6) 13)  Chest Pain  (ICD-786.50) 14)  Psa, Increased   (ICD-790.93) 15)  Preventive Health Care  (ICD-V70.0) 16)  Cramp of Limb  (ICD-729.82) 17)  Diverticulosis, Colon  (ICD-562.10) 18)  Hemorrhoids  (ICD-455.6) 19)  Erectile Dysfunction  (ICD-302.72) 20)  Abscess, Lung  (ICD-513.0) 21)  Peripheral Vascular Disease  (ICD-443.9) 22)  Hypertension  (ICD-401.9) 23)  Hyperlipidemia  (ICD-272.4) 24)  Gerd  (ICD-530.81)  Medications Prior to Update: 1)  Zetia 10 Mg  Tabs (Ezetimibe) .Marland Kitchen.. 1 By Mouth Once Daily 2)  Lisinopril 20 Mg Tabs (Lisinopril) .... 2 By Mouth Q Am 3)  Ecotrin 325 Mg  Tbec (Aspirin) .Marland Kitchen.. 1 By Mouth Qd 4)  Crestor 20 Mg Tabs (Rosuvastatin Calcium) .Marland Kitchen.. 1 By Mouth Once Daily 5)  Viagra 100 Mg Tabs (Sildenafil Citrate) .Marland Kitchen.. 1 By Mouth Daily As Needed 6)  Amlodipine Besylate 10 Mg Tabs (Amlodipine Besylate) .Marland Kitchen.. 1 By Mouth Once Daily  Current Medications (verified): 1)  Zetia 10 Mg  Tabs (Ezetimibe) .Marland Kitchen.. 1 By Mouth Once Daily 2)  Lisinopril 20 Mg Tabs (Lisinopril) .... 2 By Mouth Q Am 3)  Ecotrin 325 Mg  Tbec (Aspirin) .Marland Kitchen.. 1 By Mouth Qd 4)  Crestor 20 Mg Tabs (Rosuvastatin Calcium) .Marland Kitchen.. 1 By Mouth Once Daily 5)  Viagra 100 Mg Tabs (Sildenafil Citrate) .Marland Kitchen.. 1 By  Mouth Daily As Needed 6)  Amlodipine Besylate 10 Mg Tabs (Amlodipine Besylate) .Marland Kitchen.. 1 By Mouth Once Daily 7)  Levofloxacin 500 Mg Tabs (Levofloxacin) .Marland Kitchen.. 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 at [facility] Drug use-no  Risk Factors: Alcohol Use: 2-3 (05/14/2008)  Risk Factors: Smoking Status: quit (05/14/2008)  Past Medical History: GERD Hyperlipidemia Hypertension Peripheral vascular disease Erectile Dysfunction Hemorrhoids Diverticulosis, colon cluster headache hx of lung abscess cardiomyopathy  secondary to HTN coronary calcifications by CT march 2011 diastolic dysfunction by echo sept 2010 Emphysema by CT /COPD  Social History: Former Smoker 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  , mild to mod ill appearing Head:  normocephalic and atraumatic.   Eyes:  vision grossly intact, pupils equal, and pupils round.   Ears:  R ear normal and L ear normal.   Nose:  no external deformity and no nasal discharge.   Mouth:  no gingival abnormalities and pharynx pink and moist.   Neck:  supple and no masses.   Lungs:  normal respiratory effort, R decreased breath sounds, and L decreased breath sounds.  m no wheeze but with few LLL rales Heart:  regular rhythm and no murmur.   Abdomen:  soft, non-tender, and normal bowel sounds.   Rectal:  deferred per pt Msk:  spine nontender, no paravertebral tender or swelling , no rash Extremities:  no edema, no erythema  Neurologic:  strength normal in all extremities.     Impression & Recommendations:  Problem # 1:  CHEST PAIN (ICD-786.50)  pleuritc, left lateral chest approx t8, level, spine nontender, no rash - for ecg, cxr - ? atypical PNA with underlying copd vs pleurisy/viral;  with his chills and urinary freq below will add empiric levaquin   Orders: EKG w/ Interpretation (93000) T-2 View CXR, Same Day (71020.5TC)  Problem # 2:  CHILLS WITHOUT FEVER (ICD-780.64)  feels warm on exam but afeb by exam, for blood cx, cbc  Orders: T-Culture, Blood Routine ([address]) TLB-CBC Platelet - w/Differential (85025-CBCD) TLB-BMP (Basic Metabolic Panel-BMET) (80048-METABOL) TLB-Hepatic/Liver Function Pnl (80076-HEPATIC)  Problem # 3:  FREQUENCY, URINARY (ICD-788.41)  ? clinical significance, but did go hourly last night (every time he awoke with the left chest  pain on turning over) and 5 times already today  - for urine studies, levaquin above and f/u blood and urine cx  Orders: T-Culture, Urine ([id number]) TLB-Udip w/ Micro (81001-URINE)  cbg in the office 98  Problem # 4:  HYPERTENSION (ICD-401.9)  His updated medication list for this problem includes:    Lisinopril 20 Mg Tabs (Lisinopril) .Marland Kitchen... 2 by mouth q am    Amlodipine Besylate 10 Mg Tabs (Amlodipine besylate) .Marland Kitchen... 1 by mouth once daily  BP today: 110/68 Prior BP: 100/58 (11/08/2009)  Labs Reviewed: K+: 4.8 (11/02/2009) Creat: : 1.1 (11/02/2009)   Chol: 165 (11/02/2009)   HDL: 53.70 (11/02/2009)   LDL: 83 (11/02/2009)   TG: 141.0 (11/02/2009) stable overall by hx and exam, ok to continue meds/tx as is   Complete Medication List: 1)  Zetia 10 Mg Tabs (Ezetimibe) .Marland KitchenMarland KitchenMarland Kitchen  1 by mouth once daily 2)  Lisinopril 20 Mg Tabs (Lisinopril) .... 2 by mouth q am 3)  Ecotrin 325 Mg Tbec (Aspirin) .Marland Kitchen.. 1 by mouth qd 4)  Crestor 20 Mg Tabs (Rosuvastatin calcium) .Marland Kitchen.. 1 by mouth once daily 5)  Viagra 100 Mg Tabs (Sildenafil citrate) .Marland Kitchen.. 1 by mouth daily as needed 6)  Amlodipine Besylate 10 Mg Tabs (Amlodipine besylate) .Marland Kitchen.. 1 by mouth once daily 7)  Levofloxacin 500 Mg Tabs (Levofloxacin) .Marland Kitchen.. 1po once daily  Patient Instructions: 1)  your blood sugar in the office is 98 today(normal) 2)  Please go to the Lab in the basement for your blood and/or urine tests today 3)  Please go to Radiology in the basement level for your X-Ray today  4)  Please call the number on the [facility] Card for results of your testing  5)  Please take all new medications as prescribed  - the levaquin antibiotic (generic) 6)  Continue all previous medications as before this visit  7)  Please schedule a follow-up appointment as needed. 8)  Your FMLA was filled out Prescriptions: LEVOFLOXACIN 500 MG TABS (LEVOFLOXACIN) 1po once daily  #10 x 0   Entered and Authorized by:   Corwin Levins MD   Signed by:   Corwin Levins MD  on 12/24/2009   Method used:   Print then Give to Patient   RxID:   [id number]    Orders Added: 1)  EKG w/ Interpretation [93000] 2)  T-Culture, Urine [[id number]] 3)  T-Culture, Blood Routine [87040-70240] 4)  TLB-Udip w/ Micro [81001-URINE] 5)  TLB-CBC Platelet - w/Differential [85025-CBCD] 6)  TLB-BMP (Basic Metabolic Panel-BMET) [80048-METABOL] 7)  TLB-Hepatic/Liver Function Pnl [80076-HEPATIC] 8)  T-2 View CXR, Same Day [71020.5TC] 9)  Est. Patient Level IV [41324]

## 2010-03-22 NOTE — Miscellaneous (Signed)
Summary: Orders Update   Clinical Lists Changes  Problems: Added new problem of NONSPC ABN FINDNG RAD&OTH EXAM OTH INTRTHOR ORGN (ICD-793.2) Orders: Added new Referral order of Pulmonary Referral (Pulmonary) - Signed

## 2010-03-22 NOTE — Miscellaneous (Signed)
Summary: Orders Update   Clinical Lists Changes  Problems: Added new problem of BACTERIAL PNEUMONIA (ICD-482.9) Orders: Added new Test order of T-2 View CXR, Same Day (71020.5TC) - Signed

## 2010-03-22 NOTE — Letter (Signed)
Summary: Out of Work  [facility]  [address] [place], [place] [address]   Phone: [phone]  Fax: [phone]    December 24, 2009   Employee:  CHRISTOS MIXSON    To Whom It May Concern:   For Medical reasons, please excuse the above named employee from work for the following dates:  Start:   Dec 24, 2009  End:   Dec 24, 2009  If you need additional information, please feel free to contact our office.         Sincerely,    Corwin Levins MD

## 2010-03-22 NOTE — Letter (Signed)
SummaryScience writer Pulmonary Care Appointment Letter  [facility]  [address]   [place], [place] [address]   Phone: [phone]  Fax: [phone]    04/19/2009 MRN: [id number]  TALBERT TREMBATH [address] [place], [place]  [address]  Dear Mr. LYFORD,   Our office is attempting to contact you about an appointment.  Please call our office at [phone] to schedule this appointment with Dr.________________.  Our registration staff is prepared to assist you with any questions you may have.    Thank you,   Nature conservation officer Pulmonary Division

## 2010-03-22 NOTE — Miscellaneous (Signed)
Summary: Orders Update   Clinical Lists Changes  Problems: Added new problem of OTHER DISEASES OF LUNG NOT ELSEWHERE CLASSIFIED (ZOX-096.04) - Signed Orders: Added new Referral order of Radiology Referral (Radiology) - Signed

## 2010-03-22 NOTE — Progress Notes (Signed)
Summary: CT Chest scheduled in Feb  Phone Note Outgoing Call   Call placed by: Bjorn Loser Call placed to: Dr Vassie Loll Summary of Call: Pt had a CT Chest without contast scheduled at [facility]. for 04/12/09. When I called to check if pre-cert was needed, I found out that with pt's insurance [facility] is considered out of network. This appt was cancelled and pt was going to check with his insurance and call me back to re-schedule this CT Chest. I advised pt that according to pt's insurance company that [facility] was in network and I could re-schedule CT Chest there. Pt declined and stated that he would call the insurance company himself and then return my call.  Pt has not returned my call and I have called pt several times and I leave a message on pt's answering machine every time. I have called pt on 04/13/09 left message, called again on 04/15/09 and left message and then again today 04/19/09 [facility] that we need to get this CT R/S. Just wanted to let you know. I will keep trying, but I'm not sure why he is not calling me back. Initial call taken by: Alfonso Ramus,  April 19, 2009 3:22 PM  Follow-up for Phone Call        send him a letter stating that it is his responsibility to fu Follow-up by: Comer Locket. Vassie Loll MD,  April 19, 2009 3:40 PM  Additional Follow-up for Phone Call Additional follow up Details #1::        Didn't have a letter in system, so I made one. I will have this scanned into emr so we will have a record of this. Do you want me to send certified mail or regular mail? Alfonso Ramus  April 19, 2009 4:29 PM  certified  Additional Follow-up by: Comer Locket. Vassie Loll MD,  April 19, 2009 5:48 PM    Additional Follow-up for Phone Call Additional follow up Details #2::    ok will send out today. Thanks Rhonda Cobb  April 20, 2009 10:23 AM

## 2010-03-22 NOTE — Assessment & Plan Note (Signed)
Summary: 6 MONTH F/U PULMONARY NODULE/RJC   Visit Type:  Follow-up Primary Provider/Referring Provider:  Dr. Jonny Ruiz  CC:  Pt here for 6 month follow up. Marland Kitchen  History of Present Illness: 60 y.o 48 PY ex- smoker, likely COPD with stable pulmonary nodules since 3/10 Developed rt chest pain & had CXR which showed RLL rounded density. CT chest clarified heterogenous consolidation in subpleurla RLL & irregular 10 mmnodule in post segment of RUL. No medaistinal Lns or effusions. He describes blood tinged sputum early mornings. he has a recent dental visit for cleaning his teeth. He describes occasional night sweats , no fevers. He is a heavy drinker & wife reports 2-3 mixed drinks/ day. He has passed out on one occasion (? reaction to viagra!), but denies drunken stupor.  4/23>> Reviewed CT & PET scans, hemoptysis has stopped, cough resolved, has stopped drinking & is very thankful. Smokes weed, no cigs. No fevers, night sweats, dyspnea  May 12, 2009 4:30 PM  Doing well, smokes weed, no cigs. Drinks, never had withdrawal symptoms. Weight OK. No dyspnea.  Current Medications (verified): 1)  Zetia 10 Mg  Tabs (Ezetimibe) .Marland Kitchen.. 1 By Mouth Once Daily 2)  Lisinopril 20 Mg Tabs (Lisinopril) .... 2 By Mouth Q Am 3)  Ecotrin 325 Mg  Tbec (Aspirin) .Marland Kitchen.. 1 By Mouth Qd 4)  Crestor 40 Mg Tabs (Rosuvastatin Calcium) .... 1/2 Po Once Daily 5)  Viagra 100 Mg Tabs (Sildenafil Citrate) .Marland Kitchen.. 1 By Mouth Daily As Needed 6)  Amlodipine Besylate 10 Mg Tabs (Amlodipine Besylate) .Marland Kitchen.. 1 By Mouth Once Daily  Allergies (verified): No Known Drug Allergies  Past History:  Past Medical History: Last updated: 03/07/2007 GERD Hyperlipidemia Hypertension Peripheral vascular disease Erectile Dysfunction Hemorrhoids Diverticulosis, colon cluster headache E.D. 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    Vital Signs:  Patient profile:   60 year old male Height:      76 inches Weight:      172 pounds O2 Sat:      99 % on Room air Temp:     98.5 degrees F oral Pulse rate:   66 / minute BP sitting:   144 / 86  (left arm) Cuff size:   regular  Vitals Entered By: Zackery Barefoot CMA (May 12, 2009 4:13 PM)  O2 Flow:  Room air CC: Pt here for 6 month follow up.  Comments Medications reviewed with patient Verified contact number and pharmacy with patient Zackery Barefoot CMA  May 12, 2009 4:15 PM    Physical Exam  Additional Exam:  Gen. Pleasant, in no distress, tall thin ENT - no lesions, no post nasal drip Neck: No JVD, no thyromegaly, no carotid bruits Lungs: no use of accessory muscles, no dullness to percussion, clear without rales or rhonchi , 2 cm mobile liomatous mass in rt medial scapular region Cardiovascular: Rhythm regular, heart sounds  normal, no murmurs or gallops, no peripheral edema Musculoskeletal: No deformities, no cyanosis or clubbing      CT of Chest  Procedure date:  05/04/2009  Findings:      IMPRESSION:   1.  The small irregular opacity noted previously in the right upper lobe is stable to slightly decreased in size, most consistent with a focus of scarring.  No new parenchymal abnormality is seen. Recommend continued follow-up with CT chest in 1 year in view of the patient's history of smoking. 2.  Bullous changes particularly in the apices right greater  than left. 3.  Faint coronary artery calcification.  Impression & Recommendations:  Problem # 1:  PULMONARY NODULE (ICD-518.89) stable since 3/10, in fact CT seems to represent scarring rather than malignancy. FU in 1 yr Orders: Est. Patient Level III ([address]) Radiology Referral (Radiology)  Problem # 2:  C O P D (ICD-496)  Pursue PFTs only if symptomatic.  Patient Instructions: 1)  Please schedule a follow-up appointment in 1 year. 2)  A Chest CT WITHOUT Contrast has been recommended. Your imaging study may require preauthorization.

## 2010-03-22 NOTE — Miscellaneous (Signed)
Summary: Orders Update  Clinical Lists Changes  Orders: Added new Referral order of Vascular Clinic (Vascular) - Signed 

## 2010-03-22 NOTE — Miscellaneous (Signed)
Summary: Orders Update   Clinical Lists Changes  Orders: Added new Test order of T-2 View CXR, Same Day (71020.5TC) - Signed  

## 2010-03-24 NOTE — Assessment & Plan Note (Signed)
Summary: cough/cd   Vital Signs:  Patient profile:   60 year old male Height:      76 inches Weight:      176.50 pounds BMI:     21.56 O2 Sat:      97 % on Room air Temp:     97.8 degrees F oral Pulse rate:   71 / minute BP sitting:   132 / 80  (left arm) Cuff size:   regular  Vitals Entered By: Zella Ball Ewing CMA Duncan Dull) (February 25, 2010 3:36 PM)  O2 Flow:  Room air CC: Left side pain/RE   Primary Care Provider:  Dr. Jonny Ruiz  CC:  Left side pain/RE.  History of Present Illness: here with c/o left flank pain worse x 3 days, dull with sharp flares, moderate but not severe, worse to move about, ? somewhat plerutiic and mild tender to touch, but no redness, swelling or erythema;  denies spinal pain, and No fever, wt loss, night sweats, loss of appetite or other constitutional symptoms  Pt denies  worsening cough, sob, doe, wheezing, orthopnea, pnd, worsening LE edema, palps, dizziness or syncope , though did have recent ?pna near the same area recently.  Pt denies new neuro symptoms such as headache, facial or extremity weakness  Pt denies polydipsia, polyuria.  Denies bowel or bladder changes, such as dysuria, freq, urgency or blood.  No rash or other sweling,  Alleve OTC helped last night for the pain.  Problems Prior to Update: 1)  Flank Pain, Left  (ICD-789.09) 2)  Bacterial Pneumonia  (ICD-482.9) 3)  Frequency, Urinary  (ICD-788.41) 4)  Chest Pain  (ICD-786.50) 5)  Chills Without Fever  (ICD-780.64) 6)  Claudication  (ICD-443.9) 7)  Muscle Cramps  (ICD-729.82) 8)  C O P D  (ICD-496) 9)  Plantar Fasciitis, Left  (ICD-728.71) 10)  Abnormal Electrocardiogram  (ICD-794.31) 11)  Preventive Health Care  (ICD-V70.0) 12)  Pulmonary Nodule  (ICD-518.89) 13)  Psa, Increased  (ICD-790.93) 14)  Preventive Health Care  (ICD-V70.0) 15)  Cramp of Limb  (ICD-729.82) 16)  Diverticulosis, Colon  (ICD-562.10) 17)  Hemorrhoids  (ICD-455.6) 18)  Erectile Dysfunction  (ICD-302.72) 19)  Abscess,  Lung  (ICD-513.0) 20)  Peripheral Vascular Disease  (ICD-443.9) 21)  Hypertension  (ICD-401.9) 22)  Hyperlipidemia  (ICD-272.4) 23)  Gerd  (ICD-530.81)  Medications Prior to Update: 1)  Zetia 10 Mg  Tabs (Ezetimibe) .Marland Kitchen.. 1 By Mouth Once Daily 2)  Lisinopril 20 Mg Tabs (Lisinopril) .... 2 By Mouth Q Am 3)  Ecotrin 325 Mg  Tbec (Aspirin) .Marland Kitchen.. 1 By Mouth Qd 4)  Crestor 20 Mg Tabs (Rosuvastatin Calcium) .Marland Kitchen.. 1 By Mouth Once Daily 5)  Viagra 100 Mg Tabs (Sildenafil Citrate) .Marland Kitchen.. 1 By Mouth Daily As Needed 6)  Amlodipine Besylate 10 Mg Tabs (Amlodipine Besylate) .Marland Kitchen.. 1 By Mouth Once Daily  Current Medications (verified): 1)  Zetia 10 Mg  Tabs (Ezetimibe) .Marland Kitchen.. 1 By Mouth Once Daily 2)  Lisinopril 20 Mg Tabs (Lisinopril) .... 2 By Mouth Q Am 3)  Ecotrin 325 Mg  Tbec (Aspirin) .Marland Kitchen.. 1 By Mouth Qd 4)  Crestor 20 Mg Tabs (Rosuvastatin Calcium) .Marland Kitchen.. 1 By Mouth Once Daily 5)  Viagra 100 Mg Tabs (Sildenafil Citrate) .Marland Kitchen.. 1 By Mouth Daily As Needed 6)  Amlodipine Besylate 10 Mg Tabs (Amlodipine Besylate) .Marland Kitchen.. 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   Eyes:  vision grossly intact, pupils equal, and pupils round.   Ears:  R ear normal and L ear normal.   Nose:  no external deformity and no nasal discharge.   Mouth:  no gingival abnormalities and pharynx pink and moist.   Neck:  supple and no masses.   Lungs:  normal respiratory effort and normal breath sounds.   Heart:  regular rhythm and no murmur.   Abdomen:  soft, non-tender, and normal bowel sounds.   Msk:  no joint tenderness and no joint swelling.  , but midl tender to left flank/side area  Extremities:  no edema, no erythema  Skin:  color normal and no rashes.   Psych:  not depressed appearing and slightly anxious.     Impression & Recommendations:  Problem # 1:  CHEST PAIN (ICD-786.50)  left lower back/flank pain - ? msk vs other, with hx of recent ? pna slow healing - for cxr, and UA today, for OTC alleve as needed   Orders: T-2 View CXR, Same Day (71020.5TC)  Problem # 2:  FLANK PAIN, LEFT (ICD-789.09)  His updated medication list for this problem includes:    Ecotrin 325 Mg Tbec (Aspirin) .Marland Kitchen... 1 by mouth qd  Orders: TLB-Udip w/ Micro (81001-URINE) as above - treat as above, f/u any worsening signs or symptoms   Problem # 3:  BACTERIAL PNEUMONIA (ICD-482.9) no evidence for recurrence other than symptoms above - Continue all previous medications as before this visit   Problem # 4:  HYPERTENSION (ICD-401.9)  His updated medication list for this problem includes:    Lisinopril 20 Mg Tabs (Lisinopril) .Marland Kitchen... 2 by mouth q am    Amlodipine Besylate 10 Mg Tabs (Amlodipine besylate) .Marland Kitchen... 1 by mouth once daily  BP today: 132/80 Prior BP: 140/90 (02/09/2010)  Labs Reviewed: K+: 3.8 (12/24/2009) Creat: : 1.1 (12/24/2009)   Chol: 165 (11/02/2009)   HDL: 53.70 (11/02/2009)   LDL: 83 (11/02/2009)   TG: 141.0 (11/02/2009) stable overall by hx and exam, ok to continue meds/tx as is   Complete Medication List: 1)  Zetia 10 Mg Tabs (Ezetimibe) .Marland Kitchen.. 1 by  mouth once daily 2)  Lisinopril 20 Mg Tabs (Lisinopril) .... 2 by mouth q am 3)  Ecotrin 325 Mg Tbec (Aspirin) .Marland Kitchen.. 1 by mouth qd 4)  Crestor 20 Mg Tabs (Rosuvastatin calcium) .Marland Kitchen.. 1 by mouth once daily 5)  Viagra 100 Mg Tabs (Sildenafil citrate) .Marland Kitchen.. 1 by mouth daily as needed 6)  Amlodipine Besylate 10 Mg Tabs (Amlodipine besylate) .Marland Kitchen.. 1 by mouth once daily  Patient Instructions: 1)  Please go to Radiology in the basement level for your X-Ray today  2)  Please go to the Lab in the basement for your blood and/or urine tests today  3)  Please call the number on the [facility] for  results of your testing  4)  Please schedule a follow-up appointment in Sept 2012 for CPX with labs 5)  Your FMLA form will be filled and you should be called to pick up next wk 6)  Continue all previous medications as before this visit    Orders Added: 1)  TLB-Udip w/ Micro [81001-URINE] 2)  T-2 View CXR, Same Day [71020.5TC] 3)  Est. Patient Level IV [60454]

## 2010-03-24 NOTE — Letter (Signed)
Summary: Out of Work  [facility]  [address] [place], [place] [address]   Phone: [phone]  Fax: [phone]    February 25, 2010   Employee:  DAUNTAE DERUSHA    To Whom It May Concern:   For Medical reasons, please excuse the above named employee from work for the following dates:  Start:   Feb 25, 2010  End:   Feb 25, 2010  If you need additional information, please feel free to contact our office.         Sincerely,    Corwin Levins MD

## 2010-03-24 NOTE — Letter (Signed)
Summary: [employer]  [address]   [place], [place] [address]   Phone: [phone]  Fax: [phone]    Today's Date: February 09, 2010  Name of Patient: Charles [name]  The above named patient had a medical visit today at:  3pm.  Please take this into consideration when reviewing the time away from work.    Special Instructions:  [ x ] None  [  ] To be off the remainder of today, returning to the normal work / school schedule tomorrow.  [  ] To be off until the next scheduled appointment on ______________________.  [  ] Other ________________________________________________________________ ________________________________________________________________________   Sincerely yours,   Zackery Barefoot CMA

## 2010-03-24 NOTE — Letter (Signed)
Summary: FMLA forms  FMLA forms   Imported By: Sherian Rein 03/03/2010 08:35:28  _____________________________________________________________________  External Attachment:    Type:   Image     Comment:   External Document

## 2010-03-24 NOTE — Assessment & Plan Note (Signed)
Summary: new LLL ct apparent can't r/o malignancy/jd   Visit Type:  Follow-up Primary Provider/Referring Provider:  Dr. Jonny Ruiz  CC:  Abnormal CT .  History of Present Illness: 60 y.o 48 PY ex- smoker, likely COPD with stable pulmonary nodules since 3/10 Presented 3/10 with rt chest pain & hemoptysis. CT chest clarified heterogenous consolidation in subpleurla RLL & irregular 10 mmnodule in post segment of RUL. FDG uptake was 1.7 on PET..This was clarified on serial CT, last march'11 to be scarring. He is a heavy drinker & wife reports 2-3 mixed drinks/ day. He has passed out on one occasion (? reaction to viagra!), but denies drunken stupor.  February 09, 2010 3:52 PM  Developed LLL pneumonia in nov,  CT repeated >> RUL nodule stable since 3/10, Subpleural consolidation in the left lower lobe is seen with mild volume loss.  When compared with CXRs  dating back to 12/24/2009, there is slight gradual decrease in prominence, without complete resolution. He is back to baseline, continues to drink, smokes weed, no hemoptysis this time.  Preventive Screening-Counseling & Management  Alcohol-Tobacco     Alcohol drinks/day: 3     Alcohol type: spirits     Smoking Status: quit     Year Quit: 1990  Allergies: No Known Drug Allergies  Past History:  Past Medical History: Last updated: 12/24/2009 GERD Hyperlipidemia Hypertension Peripheral vascular disease Erectile Dysfunction Hemorrhoids Diverticulosis, colon cluster headache hx of lung abscess cardiomyopathy secondary to HTN coronary calcifications by CT march 2011 diastolic dysfunction by echo sept 2010 Emphysema by CT /COPD  Social History: Last updated: 12/24/2009 Former Smoker 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    Vital Signs:  Patient profile:   60 year old male Height:      76 inches Weight:      175 pounds BMI:     21.38 O2 Sat:      98 % on Room air Temp:     98.3 degrees F oral Pulse rate:   68 / minute BP sitting:   140 / 90  (right arm) Cuff size:   regular  Vitals Entered By: Zackery Barefoot CMA (February 09, 2010 3:12 PM)  O2 Flow:  Room air CC: Abnormal CT  Comments Medications reviewed with patient Verified contact number and pharmacy with patient Zackery Barefoot CMA  February 09, 2010 3:12 PM    Physical Exam  Additional Exam:  Gen. Pleasant, in no distress, tall thin ENT - no lesions, no post nasal drip Neck: No JVD, no thyromegaly, no carotid bruits Lungs: no use of accessory muscles, no dullness to percussion, clear without rales or rhonchi , 2 cm mobile liomatous mass in rt medial scapular region Cardiovascular: Rhythm regular, heart sounds  normal, no murmurs or gallops, no peripheral edema Musculoskeletal: No deformities, no cyanosis or clubbing      Impression & Recommendations:  Problem # 1:  PULMONARY NODULE (ICD-518.89) Improvement on CT compared to CXR s/o infectious/ inflammatory process FU CT in 6 mnts should suffice. This will also complete 2 yr FU of  RUL nodule. Radiology Referral (Radiology) Est. Patient Level IV (86578)  Problem # 2:  C O P D (ICD-496)   Pursue PFTs only if symptomatic.  Orders: Est. Patient Level IV (46962)  Patient Instructions: 1)  Copy sent to: Dr Jonny Ruiz 2)  Please schedule a follow-up appointment in 6 months after CT 3)  A Chest CT WITHOUT Contrast has been  recommended. Your imaging study may require preauthorization.

## 2010-06-01 LAB — GLUCOSE, CAPILLARY: Glucose-Capillary: 94 mg/dL (ref 70–99)

## 2010-06-09 ENCOUNTER — Ambulatory Visit: Payer: Self-pay | Admitting: Vascular Surgery

## 2010-07-05 NOTE — Assessment & Plan Note (Signed)
OFFICE VISIT   SHEIKH, LEVERICH  DOB:  Jul 09, 1950                                       12/09/2009  [id number]#:[id number]   CONTINUATION:   MEDICATIONS:  Zetia 10 mg once a day, lisinopril 20 mg 2 in the morning,  aspirin 325 mg once a day, Crestor 20 mg once a day, amlodipine 10 mg  once a day, Viagra 100 mg p.r.n.   ALLERGIES:  He has no known drug allergies.   PHYSICAL EXAMINATION:  Blood pressure is 163/83 in the left arm, heart  rate 67 and regular, oxygen saturation is 97% on room air.  HEENT:  Extraocular movements intact.  Conjunctivae normal.  Mucous membranes  moist.  Neck:  2+ carotid pulses without bruit.  Chest:  Clear to  auscultation.  Cardiac:  Regular rate and rhythm without murmur.  Abdomen:  Soft, nontender, nondistended.  No masses.  Extremities:  He  has no lower extremity edema.  He has no major joint deformities.  Neurologic:  Symmetric upper extremity and lower extremity motor  strength which is 5/5.  Extremity pulse exam shows 2+ radial, 2+ femoral  pulses bilaterally.  She has absent popliteal and pedal pulses.  Skin:  Has no open ulcers or rashes.   He had bilateral ABIs performed today which were 0.65 on the right, 0.59  on the left.  He had a possible occlusion of the mid right SFA in the  distal left superficial femoral artery.   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  He currently does not have what he feels is lifestyle-  limiting claudication-type symptoms.  I informed today that he is at  very low risk of limb loss with the amount of perfusion that his legs  are currently receiving as long as he does not develop diabetes or  resume smoking long-term.  His risk of limb loss lifetime should be less  than 5%.  In light of this, he has opted to not undergo any procedures  for his lower extremities at this point.   I have offered him possible  arteriography, angioplasty and stenting at some point in the future if  he feels that his symptoms are more disabling.  Otherwise he will follow  up with [place] in 6 months' time for repeat ABIs and further evaluation.     Janetta Hora. Fields, MD   CEF/MEDQ  D:  12/09/2009  T:  12/10/2009  Job:  [id number]

## 2010-07-05 NOTE — Assessment & Plan Note (Signed)
OFFICE VISIT   Charles, [name]  DOB:  [birth year]                                       12/09/2009  EAVWU#:[id number]   CHIEF COMPLAINT:  Leg pain.   HISTORY OF PRESENT ILLNESS:  The patient is a 60 year old male referred  by Dr. Jonny Ruiz for evaluation of lower extremity claudication type  symptoms.  The patient states that he has had known blockages in his  legs for approximately 5 years.  He states he can walk about 100 yards  before he has to stop.  He states that he thinks his left leg is  slightly worse than the right.  He denies any rest pain.  He states,  however, that he does occasionally has some numbness and tingling in the  toes in the left foot.   Chronic medical problems include hypertension, elevated cholesterol.  These are both currently followed by Dr. Jonny Ruiz and controlled.  Other  atherosclerotic risk factors include prior history of smoking although  he quit 20 years ago.  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   PAST MEDICAL HISTORY:  Is as listed above.   PAST SURGICAL HISTORY:  Hemorrhoidectomy and inguinal hernia repair.   SOCIAL HISTORY:  He is a Environmental manager.  He is married.  He has  two children.  Smoking history is as listed above.  He drinks  approximately one case of beer per week.   FAMILY HISTORY:  He denies any members of his family with vascular  disease at age less than 23.   REVIEW OF SYSTEMS:  Full 12 point review of systems was performed with  the patient today, please see intake referral form for details regarding  this.   MEDICATIONS:  Zetia 10 mg once a day, lisinopril 20 mg 2 in the morning,  aspirin 325 mg once a day, Crestor 20 mg once a day, amlodipine 10 mg  once a day, Viagra 100 mg p.r.n.   ALLERGIES:  He has no known drug allergies.   PHYSICAL  EXAMINATION:  Blood pressure is 163/83 in the left arm, heart  rate 67 and regular, oxygen saturation is 97% on room air.  HEENT:  Extraocular movements intact.  Conjunctivae normal.  Mucous membranes  moist.  Neck:  2+ carotid pulses without bruit.  Chest:  Clear to  auscultation.  Cardiac:  Regular rate and rhythm without murmur.  Abdomen:  Soft, nontender, nondistended.  No masses.  Extremities:  He  has no lower extremity edema.  He has no major joint deformities.  Neurologic:  Symmetric upper extremity and lower extremity motor  strength which is 5/5.  Extremity pulse exam shows 2+ radial, 2+ femoral  pulses bilaterally.  She has absent popliteal and pedal pulses.  Skin:  Has no open ulcers or rashes.   He had bilateral ABIs performed today which were 0.65 on the right, 0.59  on the left.  He had a possible occlusion of the mid right SFA in the  distal left superficial femoral artery.   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  He currently  does not have what he feels is lifestyle-  limiting claudication-type symptoms.  I informed today that he is at  very low risk of limb loss with the amount of perfusion that his legs  are currently receiving as long as he does not develop diabetes or  resume smoking long-term.  His risk of limb loss lifetime should be less  than 5%.  In light of this, he has opted to not undergo any procedures  for his lower extremities at this point.  I have offered him possible  arteriography, angioplasty and stenting at some point in the future if  he feels that his symptoms are more disabling.  Otherwise he will follow  up with [place] in 6 months' time for repeat ABIs and further evaluation.     Janetta Hora. Fields, MD  Electronically Signed   CEF/MEDQ  D:  12/09/2009  T:  12/10/2009  Job:  [id number]   cc:   Corwin Levins, MD

## 2010-07-05 NOTE — Procedures (Signed)
LOWER EXTREMITY ARTERIAL DUPLEX   INDICATION:  Claudication.   HISTORY:  Diabetes:  No.  Cardiac:  No.  Hypertension:  Yes.  Smoking:  Previous.  Previous Surgery:   SINGLE LEVEL ARTERIAL EXAM                          RIGHT                LEFT  Brachial:               148                  146  Anterior tibial:        96                   86  Posterior tibial:       93                   87  Peroneal:  Ankle/Brachial Index:   0.65                 0.59   LOWER EXTREMITY ARTERIAL DUPLEX EXAM   DUPLEX:  1. Biphasic Doppler waveforms noted in the bilateral common femoral      and proximal profunda femoral arteries.  2. Monophasic waveforms noted throughout the bilateral superficial      femoral and popliteal arteries with possible occlusions noted at      the right mid superficial femoral artery, left proximal popliteal      artery, and left tibial trunk arteries.  There is also an increased      velocity of 212 cm/s noted in the left distal superficial femoral      artery.   IMPRESSION:  1. Possible total occlusions of bilateral lower extremity arteries, as      described above.  2. Moderately decreased bilateral ankle brachial indices.   ___________________________________________  Janetta Hora Fields, MD   CH/MEDQ  D:  12/10/2009  T:  12/10/2009  Job:  [id number]

## 2010-07-11 ENCOUNTER — Other Ambulatory Visit: Payer: Self-pay | Admitting: Pulmonary Disease

## 2010-07-11 DIAGNOSIS — R9389 Abnormal findings on diagnostic imaging of other specified body structures: Secondary | ICD-10-CM

## 2010-08-01 ENCOUNTER — Ambulatory Visit (INDEPENDENT_AMBULATORY_CARE_PROVIDER_SITE_OTHER)
Admission: RE | Admit: 2010-08-01 | Discharge: 2010-08-01 | Disposition: A | Discharge status: Home / Self Care | Payer: Self-pay | Source: Ambulatory Visit | Attending: Pulmonary Disease | Admitting: Pulmonary Disease

## 2010-08-01 DIAGNOSIS — R9389 Abnormal findings on diagnostic imaging of other specified body structures: Secondary | ICD-10-CM

## 2010-08-26 ENCOUNTER — Encounter: Payer: Self-pay | Admitting: Internal Medicine

## 2010-08-28 ENCOUNTER — Encounter: Payer: Self-pay | Admitting: Internal Medicine

## 2010-08-28 DIAGNOSIS — Z0000 Encounter for general adult medical examination without abnormal findings: Secondary | ICD-10-CM | POA: Insufficient documentation

## 2010-08-29 ENCOUNTER — Other Ambulatory Visit (INDEPENDENT_AMBULATORY_CARE_PROVIDER_SITE_OTHER): Payer: No Typology Code available for payment source

## 2010-08-29 ENCOUNTER — Encounter: Payer: Self-pay | Admitting: Internal Medicine

## 2010-08-29 ENCOUNTER — Ambulatory Visit (INDEPENDENT_AMBULATORY_CARE_PROVIDER_SITE_OTHER): Payer: No Typology Code available for payment source | Admitting: Internal Medicine

## 2010-08-29 VITALS — BP 94/62 | HR 96 | Temp 98.5°F | Ht 76.0 in | Wt 158.2 lb

## 2010-08-29 DIAGNOSIS — R351 Nocturia: Secondary | ICD-10-CM

## 2010-08-29 DIAGNOSIS — Z0000 Encounter for general adult medical examination without abnormal findings: Secondary | ICD-10-CM

## 2010-08-29 DIAGNOSIS — R252 Cramp and spasm: Secondary | ICD-10-CM

## 2010-08-29 DIAGNOSIS — I10 Essential (primary) hypertension: Secondary | ICD-10-CM

## 2010-08-29 LAB — URINALYSIS, ROUTINE W REFLEX MICROSCOPIC
Nitrite: NEGATIVE
Specific Gravity, Urine: 1.025 (ref 1.000–1.030)
Total Protein, Urine: NEGATIVE
Urine Glucose: NEGATIVE

## 2010-08-29 LAB — BASIC METABOLIC PANEL
BUN: 19 mg/dL (ref 6–23)
CO2: 24 mEq/L (ref 19–32)
Chloride: 100 mEq/L (ref 96–112)
Creatinine, Ser: 1.3 mg/dL (ref 0.4–1.5)
GFR: 75.59 mL/min (ref >60.00)

## 2010-08-29 LAB — MAGNESIUM: Magnesium: 2.4 mg/dL (ref 1.5–2.5)

## 2010-08-29 MED ORDER — TAMSULOSIN HCL 0.4 MG PO CAPS: 0.4000 mg | ORAL_CAPSULE | Freq: Every day | ORAL | Status: DC

## 2010-08-29 MED ORDER — EZETIMIBE 10 MG PO TABS: 10.0000 mg | ORAL_TABLET | Freq: Every day | ORAL | Status: DC

## 2010-08-29 NOTE — Assessment & Plan Note (Signed)
?   Etiology = to also check Mg today,  to f/u any worsening symptoms or concerns

## 2010-08-29 NOTE — Assessment & Plan Note (Signed)
Suspect due to BPH, but will check UA and glc, renal fxn, try to reduce "extra" po fluids during the day, and trial flomax;  to f/u any worsening symptoms or concerns, consider urology if not improved

## 2010-08-29 NOTE — Patient Instructions (Addendum)
Your prostate was enlarged on exam today Take all new medications as prescribed Continue all other medications as before Please go to LAB in the Basement for the blood and/or urine tests to be done today Please call the phone number [phone] (the [employer]Tree System) for results of testing in 2-3 days;  When calling, simply dial the number, and when prompted enter the MRN number above (the Medical Record Number) and the # key, then the message should start. Please try to reduce the amount of fluids you drink during the day, if you can do without it Please call in 1-2 wks if not improved, for urology referral Please return in 3 mo with Lab testing done 3-5 days before Your zetia was sent to the pharmacy You can also a B complex Multivitamin to see if this will help for the cramps

## 2010-08-29 NOTE — Progress Notes (Signed)
Subjective:    Patient ID: Charles [name], male    DOB: 01-08-1951, 60 y.o.   MRN: [id number]  HPI  Here to f/u - c/o nocturia 5-6 times per night, for about 2-3 wks, seems like large amounts each time. No difficulty with getting started, no sense of retention, but sometimes the flow is slower; has never seen urology in the past;  No pain or urgency or hematuria,  Pt denies fever, night sweats, loss of appetite, or other constitutional symptoms, but has lost about 10 lbs due to increased marijuana use in the past 2 mo.  Does drink a lot of fluids during the day.  LSAt PSA .68 in sept 2011.  No hx of BPH, or prostatitis.  Past Medical History  Diagnosis Date  . ABNORMAL ELECTROCARDIOGRAM 11/02/2008  . ABSCESS, LUNG 10/23/2006  . BACTERIAL PNEUMONIA 12/28/2009  . C O P D 05/12/2009  . CHEST PAIN 12/24/2009  . Cramp of limb 07/19/2007  . DIVERTICULOSIS, COLON 03/07/2007  . 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/23/2006  . FLANK PAIN, LEFT 02/25/2010  . FREQUENCY, URINARY 12/24/2009  . GERD 10/23/2006  . HEMORRHOIDS 10/23/2006  . HYPERLIPIDEMIA 10/23/2006  . HYPERTENSION 10/23/2006  . PLANTAR FASCIITIS, LEFT 11/02/2008  . PSA, INCREASED 11/20/2007  . PULMONARY NODULE 05/14/2008  . Unspecified Peripheral Vascular Disease 10/23/2006   Past Surgical History  Procedure Date  . Hernia repair   . Hemorrhoid surgery     reports that he has quit smoking. He does not have any smokeless tobacco history on file. He reports that he does not drink alcohol or use illicit drugs. family history includes Kidney disease in his sister. No Known Allergies Current Outpatient Prescriptions on File Prior to Visit  Medication Sig Dispense Refill  . amLODipine (NORVASC) 10 MG tablet Take 10 mg by mouth daily.        Marland Kitchen aspirin 325 MG EC tablet Take 325 mg by mouth daily.        Marland Kitchen ezetimibe (ZETIA) 10 MG tablet Take 10 mg by mouth daily.        Marland Kitchen lisinopril (PRINIVIL,ZESTRIL) 20 MG tablet Take 20 mg by mouth daily. Take 2 pills every morning         . rosuvastatin (CRESTOR) 20 MG tablet Take 20 mg by mouth daily.        . sildenafil (VIAGRA) 100 MG tablet Take 100 mg by mouth daily as needed.         Review of Systems Review of Systems  Constitutional: Negative for diaphoresis and unexpected weight change.  HENT: Negative for drooling and tinnitus.   Eyes: Negative for photophobia and visual disturbance.  Respiratory: Negative for choking and stridor.   Gastrointestinal: Negative for vomiting and blood in stool.  Genitourinary: Negative for hematuria and decreased urine volume.  Musculoskeletal: Negative for gait problem.  Skin: Negative for color change and wound.  Neurological: Negative for tremors and numbness.  Psychiatric/Behavioral: Negative for decreased concentration. The patient is not hyperactive.       Objective:   Physical Exam BP 94/62  Pulse 96  Temp(Src) 98.5 F (36.9 C) (Oral)  Ht 6\' 4"  (1.93 m)  Wt 158 lb 4 oz (71.782 kg)  BMI 19.26 kg/m2  SpO2 96% Physical Exam  VS noted Constitutional: Pt appears well-developed and well-nourished.  HENT: Head: Normocephalic.  Right Ear: External ear normal.  Left Ear: External ear normal.  Eyes: Conjunctivae and EOM are normal. Pupils are equal, round, and reactive to light.  Neck:  Normal range of motion. Neck supple.  Cardiovascular: Normal rate and regular rhythm.   Pulmonary/Chest: Effort normal and breath sounds normal.  Abd:  Soft, NT, non-distended, + BS Neurological: Pt is alert. No cranial nerve deficit.  Skin: Skin is warm. No erythema.  Psychiatric: Pt behavior is normal. Thought content normal.  DRE:  Prostate 1-2+, nontender, nonboggy, on mass, heme neg       Assessment & Plan:

## 2010-08-29 NOTE — Assessment & Plan Note (Signed)
stable overall by hx and exam, most recent data reviewed with pt, and pt to continue medical treatment as before ble  BP Readings from Last 3 Encounters:  08/29/10 94/62  02/25/10 132/80  02/09/10 140/90

## 2010-08-30 ENCOUNTER — Telehealth: Payer: Self-pay

## 2010-08-30 NOTE — Telephone Encounter (Signed)
Called patient; left message to call back.

## 2010-08-30 NOTE — Telephone Encounter (Signed)
Ok to ask pt to draw

## 2010-08-30 NOTE — Telephone Encounter (Signed)
Sent addon for [id number] this morning. They just called and said the patient will need to come in as can;t addon with what was drawn from yesterday.

## 2010-08-30 NOTE — Telephone Encounter (Signed)
Called patient informed of results and the need to return to the lab. The patient agreed to do so and scheduled the patient for labs 08/31/2010.

## 2010-08-31 ENCOUNTER — Other Ambulatory Visit (INDEPENDENT_AMBULATORY_CARE_PROVIDER_SITE_OTHER): Payer: No Typology Code available for payment source

## 2010-08-31 ENCOUNTER — Telehealth: Payer: Self-pay

## 2010-08-31 DIAGNOSIS — R7309 Other abnormal glucose: Secondary | ICD-10-CM

## 2010-08-31 LAB — HEMOGLOBIN A1C: Hgb A1c MFr Bld: 4.9 % (ref 4.6–6.5)

## 2010-08-31 MED ORDER — CYCLOBENZAPRINE HCL 5 MG PO TABS: 5.0000 mg | ORAL_TABLET | Freq: Three times a day (TID) | ORAL | Status: DC | PRN

## 2010-08-31 NOTE — Telephone Encounter (Signed)
Pt called stating that B complex is not helping with muscle cramps. Pt is requesting advisement from MD.

## 2010-08-31 NOTE — Progress Notes (Signed)
Quick Note:  Voice message left on [employer] system - lab is negative, normal or otherwise stable, pt to continue same tx ______ 

## 2010-08-31 NOTE — Telephone Encounter (Signed)
Ok to try flexeril prn - done per emr

## 2010-09-01 NOTE — Telephone Encounter (Signed)
Pt advised of Rx/pharmacy via VM. Told to call back with any further questions or concerns

## 2010-09-16 ENCOUNTER — Encounter: Payer: Self-pay | Admitting: Internal Medicine

## 2010-09-16 ENCOUNTER — Ambulatory Visit (INDEPENDENT_AMBULATORY_CARE_PROVIDER_SITE_OTHER): Payer: No Typology Code available for payment source | Admitting: Internal Medicine

## 2010-09-16 VITALS — BP 120/82 | HR 73 | Temp 98.0°F | Ht 76.0 in | Wt 168.5 lb

## 2010-09-16 DIAGNOSIS — J449 Chronic obstructive pulmonary disease, unspecified: Secondary | ICD-10-CM

## 2010-09-16 DIAGNOSIS — K4090 Unilateral inguinal hernia, without obstruction or gangrene, not specified as recurrent: Secondary | ICD-10-CM | POA: Insufficient documentation

## 2010-09-16 DIAGNOSIS — I10 Essential (primary) hypertension: Secondary | ICD-10-CM

## 2010-09-16 NOTE — Assessment & Plan Note (Signed)
stable overall by hx and exam, most recent data reviewed with pt, and pt to continue medical treatment as before  SpO2 Readings from Last 3 Encounters:  09/16/10 98%  08/29/10 96%  02/25/10 97%

## 2010-09-16 NOTE — Progress Notes (Signed)
Subjective:    Patient ID: Charles [name], male    DOB: 12/06/1950, 60 y.o.   MRN: [id number]  HPI  Here to f/u; overall doing ok except at work today suffered a new onset swelling and discomfort to the right inguinal area, similar to an episode of LIH in the past.  Pt denies chest pain, increased sob or doe, wheezing, orthopnea, PND, increased LE swelling, palpitations, dizziness or syncope.  Pt denies new neurological symptoms such as new headache, or facial or extremity weakness or numbness   Pt denies polydipsia.  No other abd pain, n/v, bowel or bladder change or trauma. Past Medical History  Diagnosis Date  . ABNORMAL ELECTROCARDIOGRAM 11/02/2008  . ABSCESS, LUNG 10/23/2006  . BACTERIAL PNEUMONIA 12/28/2009  . C O P D 05/12/2009  . CHEST PAIN 12/24/2009  . Cramp of limb 07/19/2007  . DIVERTICULOSIS, COLON 03/07/2007  . 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/23/2006  . FLANK PAIN, LEFT 02/25/2010  . FREQUENCY, URINARY 12/24/2009  . GERD 10/23/2006  . HEMORRHOIDS 10/23/2006  . HYPERLIPIDEMIA 10/23/2006  . HYPERTENSION 10/23/2006  . PLANTAR FASCIITIS, LEFT 11/02/2008  . PSA, INCREASED 11/20/2007  . PULMONARY NODULE 05/14/2008  . Unspecified Peripheral Vascular Disease 10/23/2006   Past Surgical History  Procedure Date  . Hernia repair   . Hemorrhoid surgery     reports that he has quit smoking. He does not have any smokeless tobacco history on file. He reports that he does not drink alcohol or use illicit drugs. family history includes Kidney disease in his sister. No Known Allergies Current Outpatient Prescriptions on File Prior to Visit  Medication Sig Dispense Refill  . amLODipine (NORVASC) 10 MG tablet Take 10 mg by mouth daily.        Marland Kitchen aspirin 325 MG EC tablet Take 325 mg by mouth daily.        Marland Kitchen ezetimibe (ZETIA) 10 MG tablet Take 1 tablet (10 mg total) by mouth daily.  90 tablet  3  . lisinopril (PRINIVIL,ZESTRIL) 20 MG tablet Take 20 mg by mouth daily. Take 2 pills every morning       . rosuvastatin  (CRESTOR) 20 MG tablet Take 20 mg by mouth daily.        . sildenafil (VIAGRA) 100 MG tablet Take 100 mg by mouth daily as needed.        . Tamsulosin HCl (FLOMAX) 0.4 MG CAPS Take 1 capsule (0.4 mg total) by mouth daily.  30 capsule  11  . cyclobenzaprine (FLEXERIL) 5 MG tablet Take 1 tablet (5 mg total) by mouth every 8 (eight) hours as needed for muscle spasms.  30 tablet  1   Review of Systems Review of Systems  Constitutional: Negative for diaphoresis and unexpected weight change.  HENT: Negative for drooling and tinnitus.   Eyes: Negative for photophobia and visual disturbance.  Respiratory: Negative for choking and stridor.   Gastrointestinal: Negative for vomiting and blood in stool.  Genitourinary: Negative for hematuria and decreased urine volume.       Objective:   Physical Exam BP 120/82  Pulse 73  Temp(Src) 98 F (36.7 C) (Oral)  Ht 6\' 4"  (1.93 m)  Wt 168 lb 8 oz (76.431 kg)  BMI 20.51 kg/m2  SpO2 98% Physical Exam  VS noted Constitutional: Pt appears well-developed and well-nourished.  HENT: Head: Normocephalic.  Right Ear: External ear normal.  Left Ear: External ear normal.  Eyes: Conjunctivae and EOM are normal. Pupils are equal, round, and reactive to light.  Neck: Normal range of motion. Neck supple.  Cardiovascular: Normal rate and regular rhythm.   Pulmonary/Chest: Effort normal and breath sounds normal.  Abd:  Soft, NT, non-distended, + BS Mod sized RIH noted, mild tender, reducible Neurological: Pt is alert. No cranial nerve deficit.  Skin: Skin is warm. No erythema.  Psychiatric: Pt behavior is normal. Thought content normal.         Assessment & Plan:   Right inguinal hernia New onset, will need out of work as far as push/pull/lift/bend (form filled out); out of work note given, for gen surgury referral,  to f/u any worsening symptoms or concerns, OK for surgury if needed   HYPERTENSION stable overall by hx and exam, most recent data reviewed  with pt, and pt to continue medical treatment as before  BP Readings from Last 3 Encounters:  09/16/10 120/82  08/29/10 94/62  02/25/10 132/80     C O P D stable overall by hx and exam, most recent data reviewed with pt, and pt to continue medical treatment as before  SpO2 Readings from Last 3 Encounters:  09/16/10 98%  08/29/10 96%  02/25/10 97%

## 2010-09-16 NOTE — Assessment & Plan Note (Signed)
stable overall by hx and exam, most recent data reviewed with pt, and pt to continue medical treatment as before  BP Readings from Last 3 Encounters:  09/16/10 120/82  08/29/10 94/62  02/25/10 132/80

## 2010-09-16 NOTE — Assessment & Plan Note (Addendum)
New onset, will need out of work as far as push/pull/lift/bend (form filled out); out of work note given, for gen surgury referral,  to f/u any worsening symptoms or concerns, OK for surgury if needed

## 2010-09-16 NOTE — Patient Instructions (Addendum)
The form was filled out today You are given the work note You will be contacted regarding the referral for: General Surgury

## 2010-09-27 ENCOUNTER — Encounter (INDEPENDENT_AMBULATORY_CARE_PROVIDER_SITE_OTHER): Payer: Self-pay | Admitting: Surgery

## 2010-09-27 ENCOUNTER — Ambulatory Visit (INDEPENDENT_AMBULATORY_CARE_PROVIDER_SITE_OTHER): Payer: No Typology Code available for payment source | Admitting: Surgery

## 2010-09-27 ENCOUNTER — Telehealth: Payer: Self-pay

## 2010-09-27 DIAGNOSIS — K4090 Unilateral inguinal hernia, without obstruction or gangrene, not specified as recurrent: Secondary | ICD-10-CM

## 2010-09-27 NOTE — Telephone Encounter (Signed)
Patient had appointment at [facility] today for his hernia. He informed they do not accept workman's comp. He will need a referral elsewhere. Patient would like a call back [phone] or [phone].

## 2010-09-27 NOTE — Telephone Encounter (Signed)
To forward to [facility]

## 2010-09-27 NOTE — Progress Notes (Signed)
Charles [name] is a 60 y.o. male.    Chief Complaint  Patient presents with  . Other    Eval of rih    HPI HPI This patient comes over from his primary care physician with a right inguinal hernia. The patient notes that he developed a bulge in the right inguinal area while moving some heavy materials at work. It felt very much like a left inguinal hernia that he had many years ago. He doesn't recall which certainly appeared it, but it was someone in our office. We don't have that chart currently available.  The patient continues to have discomfort in the inguinal area and the bulge remains out when he is up but does reduce when he lies down. He has some tenderness as well.  Past Medical History  Diagnosis Date  . ABNORMAL ELECTROCARDIOGRAM 11/02/2008  . ABSCESS, LUNG 10/23/2006  . BACTERIAL PNEUMONIA 12/28/2009  . C O P D 05/12/2009  . CHEST PAIN 12/24/2009  . Cramp of limb 07/19/2007  . DIVERTICULOSIS, COLON 03/07/2007  . 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/23/2006  . FLANK PAIN, LEFT 02/25/2010  . FREQUENCY, URINARY 12/24/2009  . GERD 10/23/2006  . HEMORRHOIDS 10/23/2006  . HYPERLIPIDEMIA 10/23/2006  . HYPERTENSION 10/23/2006  . PLANTAR FASCIITIS, LEFT 11/02/2008  . PSA, INCREASED 11/20/2007  . PULMONARY NODULE 05/14/2008  . Unspecified Peripheral Vascular Disease 10/23/2006    Past Surgical History  Procedure Date  . Hernia repair   . Hemorrhoid surgery     Family History  Problem Relation Age of Onset  . Kidney disease Sister     Renal transplant  . Heart disease Mother   . Heart disease Father     Social History History  Substance Use Topics  . Smoking status: Former Games developer  . Smokeless tobacco: Not on file  . Alcohol Use: No    No Known Allergies  Current Outpatient Prescriptions  Medication Sig Dispense Refill  . amLODipine (NORVASC) 10 MG tablet Take 10 mg by mouth daily.        Marland Kitchen aspirin 325 MG EC tablet Take 325 mg by mouth daily.        Marland Kitchen ezetimibe (ZETIA) 10 MG tablet Take 1  tablet (10 mg total) by mouth daily.  90 tablet  3  . lisinopril (PRINIVIL,ZESTRIL) 20 MG tablet Take 20 mg by mouth daily. Take 2 pills every morning       . rosuvastatin (CRESTOR) 20 MG tablet Take 20 mg by mouth daily.        . sildenafil (VIAGRA) 100 MG tablet Take 100 mg by mouth daily as needed.        . Tamsulosin HCl (FLOMAX) 0.4 MG CAPS Take 1 capsule (0.4 mg total) by mouth daily.  30 capsule  11    Review of Systems ROS His 15 point ROS form has been reviewed. Most of the pertinent positives are included in the past medical history above. Physical Exam Physical Exam  GENERAL: The patient is alert, oriented, and generally healthy-appearing, NAD. Mood and affect are normal.  HEENT: The head is normocephalic, the eyes nonicteric, the pupils were round regular and equal. EOMs are normal. Pharynx normal. Dentition good.  NECK: The neck is supple and there are no masses or thyromegaly.  LUNGS: Normal respirations and clear to auscultation.  HEART: Regular rhythm, with no murmurs rubs or gallops. Pulses are intact carotid, but I don't feel PT or DP. Mild varicosities are noted.  ABDOMEN: Soft, flat, and nontender. No masses  or organomegaly is noted. No hernias are noted. Bowel sounds are normal.  ZO:XWRUEA male penis and testes. He has a right inguinal hernia which is reducible. There is no left inguinal hernia.  EXTREMITIES: Good range of motion, no edema.  Blood pressure 128/82, pulse 80, temperature 98.3 F (36.8 C), temperature source Temporal, height 6\' 4"  (1.93 m), weight 179 lb 3.2 oz (81.285 kg).  Data reviewed: I have looked over the nodes sent over from his primary care physician as well as reviewed in detail his Epic chart.  Assessment/Plan Impression: Reducible right inguinal hernia  Plan: Repair as an outpatient with mesh. I discussed the surgery risks and complications including infection, recurrence et Karie Soda. I think all questions have been answered. He would  like to schedule surgery.  Emmry Hinsch J 09/27/2010, 12:15 PM

## 2010-09-27 NOTE — Patient Instructions (Signed)
Stop your aspirin 5 days before surgery. 

## 2010-09-29 ENCOUNTER — Telehealth: Payer: Self-pay

## 2010-09-29 NOTE — Telephone Encounter (Signed)
As I already have, I will refer again to [facility]

## 2010-09-29 NOTE — Telephone Encounter (Signed)
Pt called stating the Surgeon her was referred to does not take Worker's Comp. Pt is requesting a new referral to a participating provider.

## 2010-10-04 ENCOUNTER — Telehealth: Payer: Self-pay

## 2010-10-04 NOTE — Telephone Encounter (Signed)
Patient called and is requesting an extension on his work note until his situation gets resolved.

## 2010-10-04 NOTE — Telephone Encounter (Signed)
The patient saw a MD [facility] on Friday. They have not yet scheduled his surgury. The MD said he should check with PCP if thinks he should continue to be out of work as he was going to send him back until surgery scheduled.

## 2010-10-04 NOTE — Telephone Encounter (Signed)
What does the pt suggest as reasonable?  Does he have the inguinal hernia surgury scheduled?  He CAN work if he does not have to lift or bend

## 2010-10-04 NOTE — Telephone Encounter (Signed)
Ok for note to go back to work for "light duty" -  No lifting over 20 lbs, bending or squatting

## 2010-10-05 NOTE — Telephone Encounter (Signed)
Called the patient on his cell [phone] to inform letter is completed and ready for pickup. Left message to call back.

## 2010-10-05 NOTE — Telephone Encounter (Signed)
Called the patient informed letter ready. Patient informed he went to work this morning and they sent him home until his surgery. He does not need a letter.

## 2010-10-31 ENCOUNTER — Telehealth: Payer: Self-pay | Admitting: Pulmonary Disease

## 2010-10-31 NOTE — Telephone Encounter (Signed)
Pt is requesting ct results from 07/11/10. Pt states he was never called with these results. Please advise Dr. Vassie Loll. Thanks  Carver Fila, CMA

## 2010-10-31 NOTE — Telephone Encounter (Signed)
RUL nodule was unchanged from 2010 - stable x 2 years

## 2010-11-01 NOTE — Telephone Encounter (Signed)
[email or web address]  

## 2010-11-02 ENCOUNTER — Telehealth: Payer: Self-pay | Admitting: Pulmonary Disease

## 2010-11-02 NOTE — Telephone Encounter (Signed)
Pt returning call can be reached at [phone].Charles [name]

## 2010-11-02 NOTE — Telephone Encounter (Signed)
[email or web address]  

## 2010-11-02 NOTE — Telephone Encounter (Signed)
Error.Charles [name] ° °

## 2010-11-02 NOTE — Telephone Encounter (Signed)
I spoke with patient about results and he verbalized understanding and had no questions 

## 2010-11-05 ENCOUNTER — Other Ambulatory Visit: Payer: Self-pay | Admitting: Internal Medicine

## 2010-11-05 DIAGNOSIS — Z0000 Encounter for general adult medical examination without abnormal findings: Secondary | ICD-10-CM

## 2010-11-05 DIAGNOSIS — Z1289 Encounter for screening for malignant neoplasm of other sites: Secondary | ICD-10-CM

## 2010-11-07 ENCOUNTER — Other Ambulatory Visit: Payer: Self-pay

## 2010-11-07 ENCOUNTER — Telehealth: Payer: Self-pay

## 2010-11-07 DIAGNOSIS — Z0000 Encounter for general adult medical examination without abnormal findings: Secondary | ICD-10-CM

## 2010-11-07 DIAGNOSIS — Z1289 Encounter for screening for malignant neoplasm of other sites: Secondary | ICD-10-CM

## 2010-11-07 NOTE — Telephone Encounter (Signed)
Put order in for physical labs. 

## 2010-11-09 ENCOUNTER — Other Ambulatory Visit (INDEPENDENT_AMBULATORY_CARE_PROVIDER_SITE_OTHER): Payer: No Typology Code available for payment source

## 2010-11-09 ENCOUNTER — Other Ambulatory Visit: Payer: Self-pay | Admitting: Internal Medicine

## 2010-11-09 DIAGNOSIS — Z0000 Encounter for general adult medical examination without abnormal findings: Secondary | ICD-10-CM

## 2010-11-09 LAB — CBC WITH DIFFERENTIAL/PLATELET
Basophils Relative: 0.6 % (ref 0.0–3.0)
Eosinophils Relative: 2.5 % (ref 0.0–5.0)
MCV: 95.8 fl (ref 78.0–100.0)
Monocytes Absolute: 0.7 10*3/uL (ref 0.1–1.0)
Neutrophils Relative %: 49.5 % (ref 43.0–77.0)
RBC: 4.12 Mil/uL — ABNORMAL LOW (ref 4.22–5.81)
WBC: 6.8 10*3/uL (ref 4.5–10.5)

## 2010-11-09 LAB — URINALYSIS, ROUTINE W REFLEX MICROSCOPIC
Hgb urine dipstick: NEGATIVE
Ketones, ur: NEGATIVE
Urine Glucose: NEGATIVE
Urobilinogen, UA: 0.2 (ref 0.0–1.0)

## 2010-11-09 LAB — BASIC METABOLIC PANEL
BUN: 23 mg/dL (ref 6–23)
Calcium: 8.5 mg/dL (ref 8.4–10.5)
Creatinine, Ser: 1.3 mg/dL (ref 0.4–1.5)
GFR: 75.54 mL/min (ref >60.00)
Potassium: 4.6 mEq/L (ref 3.5–5.1)

## 2010-11-09 LAB — HEPATIC FUNCTION PANEL
ALT: 55 U/L — ABNORMAL HIGH (ref 0–53)
AST: 78 U/L — ABNORMAL HIGH (ref 0–37)
Alkaline Phosphatase: 44 U/L (ref 39–117)
Bilirubin, Direct: 0.1 mg/dL (ref 0.0–0.3)
Total Protein: 6.9 g/dL (ref 6.0–8.3)

## 2010-11-09 LAB — LIPID PANEL: Cholesterol: 201 mg/dL — ABNORMAL HIGH (ref 0–200)

## 2010-11-10 ENCOUNTER — Ambulatory Visit: Payer: No Typology Code available for payment source

## 2010-11-10 LAB — HEMOGLOBIN A1C: Hgb A1c MFr Bld: 4.7 % (ref 4.6–6.5)

## 2010-11-14 ENCOUNTER — Ambulatory Visit (INDEPENDENT_AMBULATORY_CARE_PROVIDER_SITE_OTHER): Payer: No Typology Code available for payment source | Admitting: Internal Medicine

## 2010-11-14 ENCOUNTER — Encounter: Payer: Self-pay | Admitting: Internal Medicine

## 2010-11-14 VITALS — BP 120/78 | HR 71 | Temp 97.7°F | Ht 76.0 in | Wt 180.0 lb

## 2010-11-14 DIAGNOSIS — R7989 Other specified abnormal findings of blood chemistry: Secondary | ICD-10-CM

## 2010-11-14 DIAGNOSIS — R945 Abnormal results of liver function studies: Secondary | ICD-10-CM

## 2010-11-14 DIAGNOSIS — R351 Nocturia: Secondary | ICD-10-CM

## 2010-11-14 DIAGNOSIS — Z0000 Encounter for general adult medical examination without abnormal findings: Secondary | ICD-10-CM

## 2010-11-14 LAB — HEPATITIS PANEL, ACUTE
HCV Ab: NEGATIVE
Hep A IgM: NEGATIVE
Hep B C IgM: NEGATIVE

## 2010-11-14 MED ORDER — SILDENAFIL CITRATE 100 MG PO TABS: 100.0000 mg | ORAL_TABLET | Freq: Every day | ORAL | Status: DC | PRN

## 2010-11-14 MED ORDER — EZETIMIBE 10 MG PO TABS: 10.0000 mg | ORAL_TABLET | Freq: Every day | ORAL | Status: DC

## 2010-11-14 MED ORDER — ROSUVASTATIN CALCIUM 20 MG PO TABS: 20.0000 mg | ORAL_TABLET | Freq: Every day | ORAL | Status: DC

## 2010-11-14 MED ORDER — TAMSULOSIN HCL 0.4 MG PO CAPS: 0.4000 mg | ORAL_CAPSULE | Freq: Every day | ORAL | Status: DC

## 2010-11-14 MED ORDER — LISINOPRIL 20 MG PO TABS: 20.0000 mg | ORAL_TABLET | Freq: Every day | ORAL | Status: DC

## 2010-11-14 MED ORDER — AMLODIPINE BESYLATE 10 MG PO TABS: 10.0000 mg | ORAL_TABLET | Freq: Every day | ORAL | Status: DC

## 2010-11-14 NOTE — Patient Instructions (Signed)
Please minimize your alcohol intake as discussed Please keep your appointments with your specialists as you have planned - the surgeon Continue all other medications as before Your refills were sent to [facility] Please call if you change your mind about the flu shot Please call if you would like the referral to urology if cutting back on fluids does not help Please return in 1 year for your yearly visit, or sooner if needed, with Lab testing done 3-5 days before

## 2010-11-14 NOTE — Progress Notes (Signed)
Subjective:    Patient ID: Charles [name], male    DOB: 1951-02-15, 60 y.o.   MRN: [id number]  HPI  Here for wellness and f/u;  Overall doing ok;  Pt denies CP, worsening SOB, DOE, wheezing, orthopnea, PND, worsening LE edema, palpitations, dizziness or syncope.  Pt denies neurological change such as new Headache, facial or extremity weakness.  Pt denies polydipsia, polyuria, or low sugar symptoms. Pt states overall good compliance with treatment and medications, good tolerability, and trying to follow lower cholesterol diet.  Pt denies worsening depressive symptoms, suicidal ideation or panic. No fever, wt loss, night sweats, loss of appetite, or other constitutional symptoms.  Pt states good ability with ADL's, low fall risk, home safety reviewed and adequate, no significant changes in hearing or vision, and occasionally active with exercise.  Admits to intermittent excess etoh use, which corresponds to recent elev lft's.   Does have signficant nocturia  - has to go about every 1-2 hrs, but does drink quite a bit of fluids and does not want to see urology at this time. 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 ABNORMAL ELECTROCARDIOGRAM 11/02/2008  . ABSCESS, LUNG 10/23/2006  . BACTERIAL PNEUMONIA 12/28/2009  . C O P D 05/12/2009  . CHEST PAIN 12/24/2009  . Cramp of limb 07/19/2007  . DIVERTICULOSIS, COLON 03/07/2007  . 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/23/2006  . FLANK PAIN, LEFT 02/25/2010  . FREQUENCY, URINARY 12/24/2009  . GERD 10/23/2006  . HEMORRHOIDS 10/23/2006  . HYPERLIPIDEMIA 10/23/2006  . HYPERTENSION 10/23/2006  . PLANTAR FASCIITIS, LEFT 11/02/2008  . PSA, INCREASED 11/20/2007  . PULMONARY NODULE 05/14/2008  . Unspecified Peripheral Vascular Disease 10/23/2006   Past Surgical History  Procedure Date  . Hernia repair   . Hemorrhoid  surgery     reports that he has quit smoking. He does not have any smokeless tobacco history on file. He reports that he does not drink alcohol or use illicit drugs. family history includes Heart disease in his father and mother and Kidney disease in his sister. No Known Allergies Current Outpatient Prescriptions on File Prior to Visit  Medication Sig Dispense Refill  . amLODipine (NORVASC) 10 MG tablet Take 10 mg by mouth daily.        Marland Kitchen aspirin 325 MG EC tablet Take 325 mg by mouth daily.        Marland Kitchen ezetimibe (ZETIA) 10 MG tablet Take 1 tablet (10 mg total) by mouth daily.  90 tablet  3  . lisinopril (PRINIVIL,ZESTRIL) 20 MG tablet Take 20 mg by mouth daily. Take 2 pills every morning       . rosuvastatin (CRESTOR) 20 MG tablet Take 20 mg by mouth daily.        . sildenafil (VIAGRA) 100 MG tablet Take 100 mg by mouth daily as needed.        . Tamsulosin HCl (FLOMAX) 0.4 MG CAPS Take 1 capsule (0.4 mg total) by mouth daily.  30 capsule  11   Review of Systems Review of Systems  Constitutional: Negative for diaphoresis, activity change, appetite change and unexpected weight change.  HENT: Negative for hearing loss, ear pain, facial swelling, mouth sores and neck stiffness.   Eyes: Negative for pain, redness and visual disturbance.  Respiratory: Negative for shortness of breath  and wheezing.   Cardiovascular: Negative for chest pain and palpitations.  Gastrointestinal: Negative for diarrhea, blood in stool, abdominal distention and rectal pain.  Genitourinary: Negative for hematuria, flank pain and decreased urine volume.  Musculoskeletal: Negative for myalgias and joint swelling.  Skin: Negative for color change and wound.  Neurological: Negative for syncope and numbness.  Hematological: Negative for adenopathy.  Psychiatric/Behavioral: Negative for hallucinations, self-injury, decreased concentration and agitation.      Objective:   Physical Exam BP 120/78  Pulse 71  Temp(Src) 97.7  F (36.5 C) (Oral)  Ht 6\' 4"  (1.93 m)  Wt 180 lb (81.647 kg)  BMI 21.91 kg/m2  SpO2 96% Physical Exam  VS noted Constitutional: Pt is oriented to person, place, and time. Appears well-developed and well-nourished.  HENT:  Head: Normocephalic and atraumatic.  Right Ear: External ear normal.  Left Ear: External ear normal.  Nose: Nose normal.  Mouth/Throat: Oropharynx is clear and moist.  Eyes: Conjunctivae and EOM are normal. Pupils are equal, round, and reactive to light.  Neck: Normal range of motion. Neck supple. No JVD present. No tracheal deviation present.  Cardiovascular: Normal rate, regular rhythm, normal heart sounds and intact distal pulses.   Pulmonary/Chest: Effort normal and breath sounds normal.  Abdominal: Soft. Bowel sounds are normal. There is no tenderness.  Musculoskeletal: Normal range of motion. Exhibits no edema.  Lymphadenopathy:  Has no cervical adenopathy.  Neurological: Pt is alert and oriented to person, place, and time. Pt has normal reflexes. No cranial nerve deficit.  Skin: Skin is warm and dry. No rash noted.  Psychiatric:  Has  normal mood and affect. Behavior is normal.     Assessment & Plan:

## 2010-11-14 NOTE — Assessment & Plan Note (Signed)
New onset, asympt, afeb, most likely related to recent increased ETOH use,  To simply follow for now

## 2010-11-14 NOTE — Assessment & Plan Note (Signed)
Signficant, but at least in part due to incresae po fluids and ETOH, a1c normal, stable function o/w with flomax, declines urology referral at this time

## 2010-11-14 NOTE — Assessment & Plan Note (Signed)

## 2010-11-22 ENCOUNTER — Telehealth: Payer: Self-pay

## 2010-11-22 DIAGNOSIS — N400 Enlarged prostate without lower urinary tract symptoms: Secondary | ICD-10-CM | POA: Insufficient documentation

## 2010-11-22 HISTORY — DX: Benign prostatic hyperplasia without lower urinary tract symptoms: N40.0

## 2010-11-22 NOTE — Telephone Encounter (Signed)
Patient called requesting the referral to the urologist mentioned at his last OV.

## 2010-11-22 NOTE — Telephone Encounter (Signed)
Done per emr 

## 2010-12-02 ENCOUNTER — Telehealth: Payer: Self-pay

## 2010-12-02 NOTE — Telephone Encounter (Signed)
Patient called to request if referral to urologist had been made. Called the patient back informed he is scheduled at [facility] With Dr. Retta Diones 01/02/11 at 11:30 AM, patient informed of appointment.

## 2010-12-05 ENCOUNTER — Ambulatory Visit: Payer: No Typology Code available for payment source | Admitting: Internal Medicine

## 2010-12-14 ENCOUNTER — Telehealth: Payer: Self-pay

## 2010-12-14 MED ORDER — LISINOPRIL 20 MG PO TABS: 20.0000 mg | ORAL_TABLET | Freq: Every day | ORAL | Status: DC

## 2010-12-14 NOTE — Telephone Encounter (Signed)
Pharmacy called requesting clarification of Lisinopril Rx, 1 tab qd or 2 tabs?

## 2010-12-14 NOTE — Telephone Encounter (Signed)
rx corrected and sent per emr

## 2011-01-11 ENCOUNTER — Telehealth: Payer: Self-pay

## 2011-01-11 NOTE — Telephone Encounter (Signed)
[employer] called to inquire dates the patient was seen by [facility] in July 2012 (09/16/2010) and to confirm the dates Dr. Jonny Ruiz took this patient out of work (09/16/2010 through 10/03/2010). Also requested ICD# For the reason out of work (ICD#550.90). Spoke to [employer] at [employer].  Patients case Number is [id number].

## 2011-11-10 ENCOUNTER — Other Ambulatory Visit (INDEPENDENT_AMBULATORY_CARE_PROVIDER_SITE_OTHER): Payer: No Typology Code available for payment source

## 2011-11-10 DIAGNOSIS — Z0000 Encounter for general adult medical examination without abnormal findings: Secondary | ICD-10-CM

## 2011-11-10 LAB — CBC WITH DIFFERENTIAL/PLATELET
Eosinophils Relative: 3.1 % (ref 0.0–5.0)
HCT: 38.9 % — ABNORMAL LOW (ref 39.0–52.0)
Hemoglobin: 12.9 g/dL — ABNORMAL LOW (ref 13.0–17.0)
Lymphs Abs: 1.6 10*3/uL (ref 0.7–4.0)
MCV: 96.6 fl (ref 78.0–100.0)
Monocytes Absolute: 0.5 10*3/uL (ref 0.1–1.0)
Monocytes Relative: 9.9 % (ref 3.0–12.0)
Neutro Abs: 2.7 10*3/uL (ref 1.4–7.7)
Platelets: 142 10*3/uL — ABNORMAL LOW (ref 150.0–400.0)
RDW: 13.7 % (ref 11.5–14.6)

## 2011-11-10 LAB — URINALYSIS, ROUTINE W REFLEX MICROSCOPIC
Bilirubin Urine: NEGATIVE
Ketones, ur: NEGATIVE
Leukocytes, UA: NEGATIVE
Nitrite: NEGATIVE
pH: 6 (ref 5.0–8.0)

## 2011-11-10 LAB — LIPID PANEL
Cholesterol: 193 mg/dL (ref 0–200)
HDL: 65.3 mg/dL (ref >39.00)
Triglycerides: 164 mg/dL — ABNORMAL HIGH (ref 0.0–149.0)
VLDL: 32.8 mg/dL (ref 0.0–40.0)

## 2011-11-10 LAB — BASIC METABOLIC PANEL
Calcium: 8.8 mg/dL (ref 8.4–10.5)
GFR: 77.4 mL/min (ref >60.00)
Potassium: 4.5 mEq/L (ref 3.5–5.1)
Sodium: 138 mEq/L (ref 135–145)

## 2011-11-10 LAB — HEPATIC FUNCTION PANEL
ALT: 22 U/L (ref 0–53)
AST: 30 U/L (ref 0–37)
Albumin: 3.5 g/dL (ref 3.5–5.2)

## 2011-11-10 LAB — PSA: PSA: 3.78 ng/mL (ref 0.10–4.00)

## 2011-11-10 LAB — TSH: TSH: 1.79 u[IU]/mL (ref 0.35–5.50)

## 2011-11-15 ENCOUNTER — Encounter: Payer: Self-pay | Admitting: Internal Medicine

## 2011-11-15 ENCOUNTER — Ambulatory Visit (INDEPENDENT_AMBULATORY_CARE_PROVIDER_SITE_OTHER): Payer: No Typology Code available for payment source | Admitting: Internal Medicine

## 2011-11-15 VITALS — BP 130/80 | HR 67 | Temp 97.0°F | Ht 76.0 in | Wt 174.4 lb

## 2011-11-15 DIAGNOSIS — R351 Nocturia: Secondary | ICD-10-CM

## 2011-11-15 DIAGNOSIS — R972 Elevated prostate specific antigen [PSA]: Secondary | ICD-10-CM

## 2011-11-15 DIAGNOSIS — R7302 Impaired glucose tolerance (oral): Secondary | ICD-10-CM

## 2011-11-15 DIAGNOSIS — R350 Frequency of micturition: Secondary | ICD-10-CM

## 2011-11-15 DIAGNOSIS — Z0000 Encounter for general adult medical examination without abnormal findings: Secondary | ICD-10-CM

## 2011-11-15 DIAGNOSIS — R7309 Other abnormal glucose: Secondary | ICD-10-CM

## 2011-11-15 MED ORDER — AMLODIPINE BESYLATE 10 MG PO TABS: 10.0000 mg | ORAL_TABLET | Freq: Every day | ORAL | Status: DC

## 2011-11-15 MED ORDER — SILDENAFIL CITRATE 100 MG PO TABS: 100.0000 mg | ORAL_TABLET | Freq: Every day | ORAL | Status: DC | PRN

## 2011-11-15 MED ORDER — LISINOPRIL 20 MG PO TABS: 20.0000 mg | ORAL_TABLET | Freq: Every day | ORAL | Status: DC

## 2011-11-15 MED ORDER — EZETIMIBE 10 MG PO TABS: 10.0000 mg | ORAL_TABLET | Freq: Every day | ORAL | Status: DC

## 2011-11-15 MED ORDER — SOLIFENACIN SUCCINATE 5 MG PO TABS: 10.0000 mg | ORAL_TABLET | Freq: Every day | ORAL | Status: DC

## 2011-11-15 MED ORDER — TAMSULOSIN HCL 0.4 MG PO CAPS: 0.4000 mg | ORAL_CAPSULE | Freq: Every day | ORAL | Status: DC

## 2011-11-15 MED ORDER — ROSUVASTATIN CALCIUM 20 MG PO TABS: 20.0000 mg | ORAL_TABLET | Freq: Every day | ORAL | Status: DC

## 2011-11-15 NOTE — Patient Instructions (Addendum)
You will be contacted regarding the referral for: urology; please take your lab information from today with you Take all new medications as prescribed - the vesicare for the bladder Continue all other medications as before Your refill were all done today Please stop caffeine, and consider stopping alcohol as this can affect the bladder and prostate functions as well You are otherwise up to date with prevention Please return in 1 year for your yearly visit, or sooner if needed, with Lab testing done 3-5 days before

## 2011-11-15 NOTE — Assessment & Plan Note (Signed)

## 2011-11-18 ENCOUNTER — Encounter: Payer: Self-pay | Admitting: Internal Medicine

## 2011-11-18 NOTE — Assessment & Plan Note (Signed)
To cont flonase, add vesicare 5 qd trial, stop caffeine, for urology referral,  to f/u any worsening symptoms or concerns

## 2011-11-18 NOTE — Progress Notes (Signed)
Subjective:    Patient ID: Charles [name], male    DOB: 10/06/1950, 61 y.o.   MRN: [id number]  HPI  Here for wellness and f/u;  Overall doing ok;  Pt denies CP, worsening SOB, DOE, wheezing, orthopnea, PND, worsening LE edema, palpitations, dizziness or syncope.  Pt denies neurological change such as new Headache, facial or extremity weakness.  Pt denies polydipsia, polyuria, or low sugar symptoms. Pt states overall good compliance with treatment and medications, good tolerability, and trying to follow lower cholesterol diet.  Pt denies worsening depressive symptoms, suicidal ideation or panic. No fever, wt loss, night sweats, loss of appetite, or other constitutional symptoms.  Pt states good ability with ADL's, low fall risk, home safety reviewed and adequate, no significant changes in hearing or vision, and occasionally active with exercise.  Has had increased urinary freq and hx of elev psa despite flomax use. Past Medical History  Diagnosis Date  . ABNORMAL ELECTROCARDIOGRAM 11/02/2008  . ABSCESS, LUNG 10/23/2006  . BACTERIAL PNEUMONIA 12/28/2009  . C O P D 05/12/2009  . CHEST PAIN 12/24/2009  . Cramp of limb 07/19/2007  . DIVERTICULOSIS, COLON 03/07/2007  . 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/23/2006  . FLANK PAIN, LEFT 02/25/2010  . FREQUENCY, URINARY 12/24/2009  . GERD 10/23/2006  . HEMORRHOIDS 10/23/2006  . HYPERLIPIDEMIA 10/23/2006  . HYPERTENSION 10/23/2006  . PLANTAR FASCIITIS, LEFT 11/02/2008  . PSA, INCREASED 11/20/2007  . PULMONARY NODULE 05/14/2008  . Unspecified Peripheral Vascular Disease 10/23/2006  . BPH (benign prostatic hyperplasia) 11/22/2010   Past Surgical History  Procedure Date  . Hernia repair   . Hemorrhoid surgery     reports that he has quit smoking. He does not have any smokeless tobacco history on file. He reports that he does not drink alcohol or use illicit drugs. family history includes Heart disease in his father and mother and Kidney disease in his sister. No Known Allergies Current  Outpatient Prescriptions on File Prior to Visit  Medication Sig Dispense Refill  . amLODipine (NORVASC) 10 MG tablet Take 1 tablet (10 mg total) by mouth daily.  90 tablet  3  . aspirin 325 MG EC tablet Take 325 mg by mouth daily.        Marland Kitchen ezetimibe (ZETIA) 10 MG tablet Take 1 tablet (10 mg total) by mouth daily.  90 tablet  3  . lisinopril (PRINIVIL,ZESTRIL) 20 MG tablet Take 1 tablet (20 mg total) by mouth daily.  90 tablet  3  . rosuvastatin (CRESTOR) 20 MG tablet Take 1 tablet (20 mg total) by mouth daily.  90 tablet  3  . sildenafil (VIAGRA) 100 MG tablet Take 1 tablet (100 mg total) by mouth daily as needed.  10 tablet  5  . solifenacin (VESICARE) 5 MG tablet Take 2 tablets (10 mg total) by mouth daily.  30 tablet  11   Review of Systems Review of Systems  Constitutional: Negative for diaphoresis, activity change, appetite change and unexpected weight change.  HENT: Negative for hearing loss, ear pain, facial swelling, mouth sores and neck stiffness.   Eyes: Negative for pain, redness and visual disturbance.  Respiratory: Negative for shortness of breath and wheezing.   Cardiovascular: Negative for chest pain and palpitations.  Gastrointestinal: Negative for diarrhea, blood in stool, abdominal distention and rectal pain.  Genitourinary: Negative for hematuria, flank pain and decreased urine volume.  Musculoskeletal: Negative for myalgias and joint swelling.  Skin: Negative for color change and wound.  Neurological: Negative for syncope and numbness.  Hematological: Negative for adenopathy.  Psychiatric/Behavioral: Negative for hallucinations, self-injury, decreased concentration and agitation.      Objective:   Physical Exam BP 130/80  Pulse 67  Temp 97 F (36.1 C) (Oral)  Ht 6\' 4"  (1.93 m)  Wt 174 lb 6 oz (79.096 kg)  BMI 21.23 kg/m2  SpO2 97% Physical Exam  VS noted Constitutional: Pt is oriented to person, place, and time. Appears well-developed and well-nourished.    HENT:  Head: Normocephalic and atraumatic.  Right Ear: External ear normal.  Left Ear: External ear normal.  Nose: Nose normal.  Mouth/Throat: Oropharynx is clear and moist.  Eyes: Conjunctivae and EOM are normal. Pupils are equal, round, and reactive to light.  Neck: Normal range of motion. Neck supple. No JVD present. No tracheal deviation present.  Cardiovascular: Normal rate, regular rhythm, normal heart sounds and intact distal pulses.   Pulmonary/Chest: Effort normal and breath sounds normal.  Abdominal: Soft. Bowel sounds are normal. There is no tenderness.  Musculoskeletal: Normal range of motion. Exhibits no edema.  Lymphadenopathy:  Has no cervical adenopathy.  Neurological: Pt is alert and oriented to person, place, and time. Pt has normal reflexes. No cranial nerve deficit.  Skin: Skin is warm and dry. No rash noted.  Psychiatric:  Has  normal mood and affect. Behavior is normal.     Assessment & Plan:

## 2012-01-23 ENCOUNTER — Other Ambulatory Visit: Payer: Self-pay | Admitting: Internal Medicine

## 2012-03-20 ENCOUNTER — Telehealth: Payer: Self-pay | Admitting: *Deleted

## 2012-03-20 NOTE — Telephone Encounter (Signed)
Would be best for visit, since some of the answers are best filled in after consulting with pt

## 2012-03-20 NOTE — Telephone Encounter (Signed)
Pt needs FMLA forms filled out-he needs to know if he needs an appointment for if he can bring forms in.

## 2012-03-20 NOTE — Telephone Encounter (Signed)
LMOM to call for an appt. °

## 2012-03-21 NOTE — Telephone Encounter (Signed)
Appt on Mar 26, 2012.

## 2012-03-26 ENCOUNTER — Ambulatory Visit (INDEPENDENT_AMBULATORY_CARE_PROVIDER_SITE_OTHER): Payer: No Typology Code available for payment source | Admitting: Internal Medicine

## 2012-03-26 ENCOUNTER — Encounter: Payer: Self-pay | Admitting: Internal Medicine

## 2012-03-26 VITALS — BP 112/80 | HR 80 | Temp 97.5°F | Ht 76.0 in | Wt 181.0 lb

## 2012-03-26 DIAGNOSIS — I739 Peripheral vascular disease, unspecified: Secondary | ICD-10-CM

## 2012-03-26 DIAGNOSIS — J449 Chronic obstructive pulmonary disease, unspecified: Secondary | ICD-10-CM

## 2012-03-26 DIAGNOSIS — I10 Essential (primary) hypertension: Secondary | ICD-10-CM

## 2012-03-26 NOTE — Assessment & Plan Note (Signed)
Clinically stable but has been many yrs since last eval, last saw Dr Downey/card years ago; ok to refer to Dr Leron Croak

## 2012-03-26 NOTE — Patient Instructions (Addendum)
Please continue all other medications as before, and refills have been done if requested. You will be contacted regarding the referral for: Dr Cooper/cardiology Your form will be filled out, to be picked up later this week Thank you for enrolling in MyChart. Please follow the instructions below to securely access your online medical record. MyChart allows you to send messages to your doctor, view your test results, renew your prescriptions, schedule appointments, and more. To Log into My Chart online, please go by [employer] or [employer] to [email or web address], or download the MyChart App from the [employer] of [employer] .  Your Username is: [email or web address] (pass coward);  Car: wizard Please send a Engineer, water on Mychart later today. Please return in Sept 26, or sooner if needed, with Lab testing done 3-5 days before

## 2012-03-31 ENCOUNTER — Encounter: Payer: Self-pay | Admitting: Internal Medicine

## 2012-03-31 NOTE — Assessment & Plan Note (Signed)
stable overall by history and exam, recent data reviewed with pt, and pt to continue medical treatment as before,  to f/u any worsening symptoms or concerns BP Readings from Last 3 Encounters:  03/26/12 112/80  11/15/11 130/80  11/14/10 120/78

## 2012-03-31 NOTE — Progress Notes (Signed)
Subjective:    Patient ID: Charles [name], male    DOB: 1950/11/20, 62 y.o.   MRN: [id number]  HPI  Here to f/u, needs FMLA form filled out, similar to previous related to his chronic co-morbids, including PVD for which he has ongoing symptoms with ambulation, has not been f/u'd on regular basis.  Also menitons a persistent new right thumb numbness for several months without pain, weakness, no trauma, swelling and no worsening joint symptoms.  Pt denies chest pain, increased sob or doe, wheezing, orthopnea, PND, increased LE swelling, palpitations, dizziness or syncope.   Pt denies polydipsia, polyuria, .  Pt states overall good compliance with meds, trying to follow lower cholesterol diet (but not always successful), wt overall stable but little exercise however.     Past Medical History  Diagnosis Date  . ABNORMAL ELECTROCARDIOGRAM 11/02/2008  . ABSCESS, LUNG 10/23/2006  . BACTERIAL PNEUMONIA 12/28/2009  . C O P D 05/12/2009  . CHEST PAIN 12/24/2009  . Cramp of limb 07/19/2007  . DIVERTICULOSIS, COLON 03/07/2007  . 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/23/2006  . FLANK PAIN, LEFT 02/25/2010  . FREQUENCY, URINARY 12/24/2009  . GERD 10/23/2006  . HEMORRHOIDS 10/23/2006  . HYPERLIPIDEMIA 10/23/2006  . HYPERTENSION 10/23/2006  . PLANTAR FASCIITIS, LEFT 11/02/2008  . PSA, INCREASED 11/20/2007  . PULMONARY NODULE 05/14/2008  . Unspecified Peripheral Vascular Disease 10/23/2006  . BPH (benign prostatic hyperplasia) 11/22/2010   Past Surgical History  Procedure Laterality Date  . Hernia repair    . Hemorrhoid surgery      reports that he has quit smoking. He does not have any smokeless tobacco history on file. He reports that he does not drink alcohol or use illicit drugs. family history includes Heart disease in his father and mother and Kidney disease in his sister. No Known Allergies Current Outpatient Prescriptions on File Prior to Visit  Medication Sig Dispense Refill  . amLODipine (NORVASC) 10 MG tablet Take 1 tablet  (10 mg total) by mouth daily.  90 tablet  3  . aspirin 325 MG EC tablet Take 325 mg by mouth daily.        Marland Kitchen ezetimibe (ZETIA) 10 MG tablet Take 1 tablet (10 mg total) by mouth daily.  90 tablet  3  . lisinopril (PRINIVIL,ZESTRIL) 20 MG tablet Take 1 tablet (20 mg total) by mouth daily.  90 tablet  3  . rosuvastatin (CRESTOR) 20 MG tablet Take 1 tablet (20 mg total) by mouth daily.  90 tablet  3  . sildenafil (VIAGRA) 100 MG tablet Take 1 tablet (100 mg total) by mouth daily as needed.  10 tablet  5   No current facility-administered medications on file prior to visit.    Review of Systems  Constitutional: Negative for unexpected weight change, or unusual diaphoresis  HENT: Negative for tinnitus.   Eyes: Negative for photophobia and visual disturbance.  Respiratory: Negative for choking and stridor.   Gastrointestinal: Negative for vomiting and blood in stool.  Genitourinary: Negative for hematuria and decreased urine volume.  Musculoskeletal: Negative for acute joint swelling Skin: Negative for color change and wound.  Neurological: Negative for tremors and numbness other than noted  Psychiatric/Behavioral: Negative for decreased concentration or  hyperactivity.       Objective:   Physical Exam BP 112/80  Pulse 80  Temp(Src) 97.5 F (36.4 C) (Oral)  Ht 6\' 4"  (1.93 m)  Wt 181 lb (82.101 kg)  BMI 22.04 kg/m2  SpO2 97% VS noted, not ill appearing Constitutional:  Pt appears well-developed and well-nourished.  HENT: Head: NCAT.  Right Ear: External ear normal.  Left Ear: External ear normal.  Eyes: Conjunctivae and EOM are normal. Pupils are equal, round, and reactive to light.  Neck: Normal range of motion. Neck supple.  Cardiovascular: Normal rate and regular rhythm.   Pulmonary/Chest: Effort normal and breath sounds normal.  Abd:  Soft, NT, non-distended, + BS Neurological: Pt is alert. Not confused  Skin: Skin is warm. No erythema. Dorsalis pedis trace to 1+ bilat   Psychiatric: Pt behavior is normal. Thought content normal.     Assessment & Plan:

## 2012-03-31 NOTE — Assessment & Plan Note (Signed)
stable overall by history and exam, recent data reviewed with pt, and pt to continue medical treatment as before,  to f/u any worsening symptoms or concerns SpO2 Readings from Last 3 Encounters:  03/26/12 97%  11/15/11 97%  11/14/10 96%

## 2012-04-03 ENCOUNTER — Ambulatory Visit: Payer: No Typology Code available for payment source | Admitting: Cardiovascular Disease

## 2012-04-06 ENCOUNTER — Other Ambulatory Visit: Payer: Self-pay

## 2012-04-24 ENCOUNTER — Ambulatory Visit: Payer: No Typology Code available for payment source | Admitting: Cardiovascular Disease

## 2012-06-07 ENCOUNTER — Ambulatory Visit (INDEPENDENT_AMBULATORY_CARE_PROVIDER_SITE_OTHER): Payer: No Typology Code available for payment source | Admitting: Internal Medicine

## 2012-06-07 ENCOUNTER — Encounter: Payer: Self-pay | Admitting: Internal Medicine

## 2012-06-07 VITALS — BP 112/72 | HR 73 | Temp 98.3°F | Ht 76.0 in | Wt 174.2 lb

## 2012-06-07 DIAGNOSIS — R7302 Impaired glucose tolerance (oral): Secondary | ICD-10-CM

## 2012-06-07 DIAGNOSIS — R7309 Other abnormal glucose: Secondary | ICD-10-CM

## 2012-06-07 DIAGNOSIS — J449 Chronic obstructive pulmonary disease, unspecified: Secondary | ICD-10-CM

## 2012-06-07 DIAGNOSIS — G44009 Cluster headache syndrome, unspecified, not intractable: Secondary | ICD-10-CM | POA: Insufficient documentation

## 2012-06-07 DIAGNOSIS — I10 Essential (primary) hypertension: Secondary | ICD-10-CM

## 2012-06-07 MED ORDER — SUMATRIPTAN SUCCINATE 100 MG PO TABS: 100.0000 mg | ORAL_TABLET | ORAL | Status: DC | PRN

## 2012-06-07 MED ORDER — NAPROXEN 500 MG PO TABS: 500.0000 mg | ORAL_TABLET | Freq: Two times a day (BID) | ORAL | Status: DC

## 2012-06-07 NOTE — Assessment & Plan Note (Signed)
Mod to severe, no pain at the present, given information, and rx for imitrex/naproxen for prn use, to avoid ETOH

## 2012-06-07 NOTE — Patient Instructions (Addendum)
Please take all new medication as prescribed You are given the information about cluster headaches today Please avoid alcohol if it tends to bring on the pain Please continue all other medications as before Thank you for enrolling in MyChart. Please follow the instructions below to securely access your online medical record. MyChart allows you to send messages to your doctor, view your test results, renew your prescriptions, schedule appointments, and more.

## 2012-06-07 NOTE — Assessment & Plan Note (Signed)
stable overall by history and exam, recent data reviewed with pt, and pt to continue medical treatment as before,  to f/u any worsening symptoms or concerns BP Readings from Last 3 Encounters:  06/07/12 112/72  03/26/12 112/80  11/15/11 130/80

## 2012-06-07 NOTE — Assessment & Plan Note (Signed)
stable overall by history and exam, recent data reviewed with pt, and pt to continue medical treatment as before,  to f/u any worsening symptoms or concerns Lab Results  Component Value Date   HGBA1C 4.8 11/10/2011

## 2012-06-07 NOTE — Assessment & Plan Note (Signed)
stable overall by history and exam, recent data reviewed with pt, and pt to continue medical treatment as before,  to f/u any worsening symptoms or concerns SpO2 Readings from Last 3 Encounters:  06/07/12 97%  03/26/12 97%  11/15/11 97%

## 2012-06-07 NOTE — Progress Notes (Signed)
Subjective:    Patient ID: Charles [name], male    DOB: [birth year], 62 y.o.   MRN: [id number]  HPI  Here with 1 wk intermittent mod to severe recurrent left sided HA, sharp and dull with sweating and eye tearing on the left only.  No vision change, other HA and Pt denies new neurological symptoms such as facial or extremity weakness or numbness.  Seemed to start after eating Pig feet and chitlins, and viagra, and ETOH last Friday evening, and recurred quickly once again with ETOH trial again 2 days ago.  Pt denies chest pain, increased sob or doe, wheezing, orthopnea, PND, increased LE swelling, palpitations, dizziness or syncope.   Pt denies polydipsia, polyuria,   Past Medical History  Diagnosis Date  . ABNORMAL ELECTROCARDIOGRAM 11/02/2008  . ABSCESS, LUNG 10/23/2006  . BACTERIAL PNEUMONIA 12/28/2009  . C O P D 05/12/2009  . CHEST PAIN 12/24/2009  . Cramp of limb 07/19/2007  . DIVERTICULOSIS, COLON 03/07/2007  . 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/23/2006  . FLANK PAIN, LEFT 02/25/2010  . FREQUENCY, URINARY 12/24/2009  . GERD 10/23/2006  . HEMORRHOIDS 10/23/2006  . HYPERLIPIDEMIA 10/23/2006  . HYPERTENSION 10/23/2006  . PLANTAR FASCIITIS, LEFT 11/02/2008  . PSA, INCREASED 11/20/2007  . PULMONARY NODULE 05/14/2008  . Unspecified Peripheral Vascular Disease 10/23/2006  . BPH (benign prostatic hyperplasia) 11/22/2010   Past Surgical History  Procedure Laterality Date  . Hernia repair    . Hemorrhoid surgery      reports that he has quit smoking. He does not have any smokeless tobacco history on file. He reports that he does not drink alcohol or use illicit drugs. family history includes Heart disease in his father and mother and Kidney disease in his sister. No Known Allergies Current Outpatient Prescriptions on File Prior to Visit  Medication Sig Dispense Refill  . amLODipine (NORVASC) 10 MG tablet Take 1 tablet (10 mg total) by mouth daily.  90 tablet  3  . aspirin 325 MG EC tablet Take 325 mg by mouth daily.         Marland Kitchen ezetimibe (ZETIA) 10 MG tablet Take 1 tablet (10 mg total) by mouth daily.  90 tablet  3  . lisinopril (PRINIVIL,ZESTRIL) 20 MG tablet Take 1 tablet (20 mg total) by mouth daily.  90 tablet  3  . rosuvastatin (CRESTOR) 20 MG tablet Take 1 tablet (20 mg total) by mouth daily.  90 tablet  3  . sildenafil (VIAGRA) 100 MG tablet Take 1 tablet (100 mg total) by mouth daily as needed.  10 tablet  5   No current facility-administered medications on file prior to visit.   Review of Systems  Constitutional: Negative for unexpected weight change, or unusual diaphoresis  HENT: Negative for tinnitus.   Eyes: Negative for photophobia and visual disturbance.  Respiratory: Negative for choking and stridor.   Gastrointestinal: Negative for vomiting and blood in stool.  Genitourinary: Negative for hematuria and decreased urine volume.  Musculoskeletal: Negative for acute joint swelling Skin: Negative for color change and wound.  Neurological: Negative for tremors and numbness other than noted  Psychiatric/Behavioral: Negative for decreased concentration or  hyperactivity.       Objective:   Physical Exam BP 112/72  Pulse 73  Temp(Src) 98.3 F (36.8 C) (Oral)  Ht 6\' 4"  (1.93 m)  Wt 174 lb 4 oz (79.039 kg)  BMI 21.22 kg/m2  SpO2 97% VS noted,  Constitutional: Pt appears well-developed and well-nourished.  HENT: Head: NCAT.  Right Ear:  External ear normal.  Left Ear: External ear normal.  Eyes: Conjunctivae and EOM are normal. Pupils are equal, round, and reactive to light.  Neck: Normal range of motion. Neck supple.  Cardiovascular: Normal rate and regular rhythm.   Pulmonary/Chest: Effort normal and breath sounds normal.  Abd:  Soft, NT, non-distended, + BS Neurological: Pt is alert. Not confused , cn 2-12 intact, motor/dtr/sens/gait intact Skin: Skin is warm. No erythema.  Psychiatric: Pt behavior is normal. Thought content normal.     Assessment & Plan:

## 2012-10-11 ENCOUNTER — Encounter: Payer: Self-pay | Admitting: Internal Medicine

## 2012-10-11 ENCOUNTER — Ambulatory Visit (INDEPENDENT_AMBULATORY_CARE_PROVIDER_SITE_OTHER): Payer: No Typology Code available for payment source | Admitting: Internal Medicine

## 2012-10-11 VITALS — BP 132/80 | HR 62 | Temp 98.4°F | Ht 76.0 in | Wt 182.2 lb

## 2012-10-11 DIAGNOSIS — H1131 Conjunctival hemorrhage, right eye: Secondary | ICD-10-CM

## 2012-10-11 DIAGNOSIS — I10 Essential (primary) hypertension: Secondary | ICD-10-CM

## 2012-10-11 DIAGNOSIS — H1130 Conjunctival hemorrhage, unspecified eye: Secondary | ICD-10-CM

## 2012-10-11 NOTE — Patient Instructions (Signed)
You have a subconjunctival hemorrhage of the right eye.   This is like a "bruise" of the white part of the eye, and will heal, and normally does not require any specific treatment.    Please continue to monitor your blood pressure closely . Please continue all other medications as before, including the aspirin  Please remember to sign up for My Chart if you have not done so, as this will be important to you in the future with finding out test results, communicating by private email, and scheduling acute appointments online when needed.  You are given the work note today

## 2012-10-11 NOTE — Progress Notes (Signed)
Subjective:    Patient ID: Charles [name], male    DOB: 06-23-1950, 62 y.o.   MRN: [id number]  HPI  Here with acute onset blood to white part of the right eye x 2 days, no obvious cause such as trauma, and BP at home has been similar to today.  Pt without pain, blurred vision, and left eye no change.  No HA, fever, sinus, ear or ST.  Pt denies chest pain, increased sob or doe, wheezing, orthopnea, PND, increased LE swelling, palpitations, dizziness or syncope. Past Medical History  Diagnosis Date  . ABNORMAL ELECTROCARDIOGRAM 11/02/2008  . ABSCESS, LUNG 10/23/2006  . BACTERIAL PNEUMONIA 12/28/2009  . C O P D 05/12/2009  . CHEST PAIN 12/24/2009  . Cramp of limb 07/19/2007  . DIVERTICULOSIS, COLON 03/07/2007  . 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/23/2006  . FLANK PAIN, LEFT 02/25/2010  . FREQUENCY, URINARY 12/24/2009  . GERD 10/23/2006  . HEMORRHOIDS 10/23/2006  . HYPERLIPIDEMIA 10/23/2006  . HYPERTENSION 10/23/2006  . PLANTAR FASCIITIS, LEFT 11/02/2008  . PSA, INCREASED 11/20/2007  . PULMONARY NODULE 05/14/2008  . Unspecified Peripheral Vascular Disease 10/23/2006  . BPH (benign prostatic hyperplasia) 11/22/2010   Past Surgical History  Procedure Laterality Date  . Hernia repair    . Hemorrhoid surgery      reports that he has quit smoking. He does not have any smokeless tobacco history on file. He reports that he does not drink alcohol or use illicit drugs. family history includes Heart disease in his father and mother; Kidney disease in his sister. No Known Allergies Current Outpatient Prescriptions on File Prior to Visit  Medication Sig Dispense Refill  . amLODipine (NORVASC) 10 MG tablet Take 1 tablet (10 mg total) by mouth daily.  90 tablet  3  . aspirin 325 MG EC tablet Take 325 mg by mouth daily.        Marland Kitchen ezetimibe (ZETIA) 10 MG tablet Take 1 tablet (10 mg total) by mouth daily.  90 tablet  3  . lisinopril (PRINIVIL,ZESTRIL) 20 MG tablet Take 1 tablet (20 mg total) by mouth daily.  90 tablet  3  . naproxen  (NAPROSYN) 500 MG tablet Take 1 tablet (500 mg total) by mouth 2 (two) times daily with a meal. As needed for pain  60 tablet  2  . rosuvastatin (CRESTOR) 20 MG tablet Take 1 tablet (20 mg total) by mouth daily.  90 tablet  3  . sildenafil (VIAGRA) 100 MG tablet Take 1 tablet (100 mg total) by mouth daily as needed.  10 tablet  5  . SUMAtriptan (IMITREX) 100 MG tablet Take 1 tablet (100 mg total) by mouth every 2 (two) hours as needed for migraine.  10 tablet  5   No current facility-administered medications on file prior to visit.   Review of Systems All otherwise neg per pt     Objective:   Physical Exam BP 132/80  Pulse 62  Temp(Src) 98.4 F (36.9 C) (Oral)  Ht 6\' 4"  (1.93 m)  Wt 182 lb 4 oz (82.668 kg)  BMI 22.19 kg/m2  SpO2 97% VS noted,  Constitutional: Pt appears well-developed and well-nourished.  HENT: Head: NCAT.  Right Ear: External ear normal.  Left Ear: External ear normal.  Eyes: Conjunctivae and EOM are normal except for small red subconjunct hemmorrhage medially, Pupils are equal, round, and reactive to light. , visual fields intact Neck: Normal range of motion. Neck supple.  Cardiovascular: Normal rate and regular rhythm.   Pulmonary/Chest: Effort normal  and breath sounds normal.  Neurological: Pt is alert. Not confused  Skin: Skin is warm. No erythema.  Psychiatric: Pt behavior is normal. Thought content normal.      Assessment & Plan:

## 2012-10-12 NOTE — Assessment & Plan Note (Signed)
stable overall by history and exam, recent data reviewed with pt, and pt to continue medical treatment as before,  to f/u any worsening symptoms or concerns BP Readings from Last 3 Encounters:  10/11/12 132/80  06/07/12 112/72  03/26/12 112/80

## 2012-10-12 NOTE — Assessment & Plan Note (Signed)
Mild, benign, BP controlled, to cont all meds including asa 325 daily,  to f/u any worsening symptoms or concerns

## 2012-11-05 ENCOUNTER — Ambulatory Visit (INDEPENDENT_AMBULATORY_CARE_PROVIDER_SITE_OTHER): Payer: No Typology Code available for payment source

## 2012-11-05 DIAGNOSIS — Z0000 Encounter for general adult medical examination without abnormal findings: Secondary | ICD-10-CM

## 2012-11-05 DIAGNOSIS — R7302 Impaired glucose tolerance (oral): Secondary | ICD-10-CM

## 2012-11-05 DIAGNOSIS — R7309 Other abnormal glucose: Secondary | ICD-10-CM

## 2012-11-05 LAB — CBC WITH DIFFERENTIAL/PLATELET
Basophils Absolute: 0.1 10*3/uL (ref 0.0–0.1)
Eosinophils Absolute: 0.2 10*3/uL (ref 0.0–0.7)
Lymphocytes Relative: 39.3 % (ref 12.0–46.0)
MCHC: 33.9 g/dL (ref 30.0–36.0)
MCV: 95.1 fl (ref 78.0–100.0)
Monocytes Absolute: 0.8 10*3/uL (ref 0.1–1.0)
Neutrophils Relative %: 45 % (ref 43.0–77.0)
RDW: 12.9 % (ref 11.5–14.6)

## 2012-11-05 LAB — BASIC METABOLIC PANEL
CO2: 27 mEq/L (ref 19–32)
Chloride: 104 mEq/L (ref 96–112)
Potassium: 4.4 mEq/L (ref 3.5–5.1)
Sodium: 137 mEq/L (ref 135–145)

## 2012-11-05 LAB — URINALYSIS, ROUTINE W REFLEX MICROSCOPIC
Bilirubin Urine: NEGATIVE
Nitrite: NEGATIVE
Total Protein, Urine: NEGATIVE
Urine Glucose: NEGATIVE
Urobilinogen, UA: 0.2 (ref 0.0–1.0)
WBC, UA: NONE SEEN (ref 0–?)

## 2012-11-05 LAB — HEPATIC FUNCTION PANEL
ALT: 15 U/L (ref 0–53)
AST: 18 U/L (ref 0–37)
Alkaline Phosphatase: 45 U/L (ref 39–117)
Bilirubin, Direct: 0.1 mg/dL (ref 0.0–0.3)
Total Bilirubin: 0.6 mg/dL (ref 0.3–1.2)
Total Protein: 7 g/dL (ref 6.0–8.3)

## 2012-11-05 LAB — LIPID PANEL
Cholesterol: 222 mg/dL — ABNORMAL HIGH (ref 0–200)
Total CHOL/HDL Ratio: 4

## 2012-11-06 LAB — LDL CHOLESTEROL, DIRECT: Direct LDL: 140.3 mg/dL

## 2012-11-15 ENCOUNTER — Encounter: Payer: Self-pay | Admitting: Internal Medicine

## 2012-11-15 ENCOUNTER — Ambulatory Visit (INDEPENDENT_AMBULATORY_CARE_PROVIDER_SITE_OTHER): Payer: No Typology Code available for payment source | Admitting: Internal Medicine

## 2012-11-15 VITALS — BP 142/82 | HR 76 | Temp 99.9°F | Ht 76.0 in | Wt 181.0 lb

## 2012-11-15 DIAGNOSIS — E785 Hyperlipidemia, unspecified: Secondary | ICD-10-CM

## 2012-11-15 DIAGNOSIS — R7309 Other abnormal glucose: Secondary | ICD-10-CM

## 2012-11-15 DIAGNOSIS — F1010 Alcohol abuse, uncomplicated: Secondary | ICD-10-CM

## 2012-11-15 DIAGNOSIS — Z7289 Other problems related to lifestyle: Secondary | ICD-10-CM

## 2012-11-15 DIAGNOSIS — Z0000 Encounter for general adult medical examination without abnormal findings: Secondary | ICD-10-CM

## 2012-11-15 DIAGNOSIS — R7302 Impaired glucose tolerance (oral): Secondary | ICD-10-CM

## 2012-11-15 DIAGNOSIS — L0231 Cutaneous abscess of buttock: Secondary | ICD-10-CM | POA: Insufficient documentation

## 2012-11-15 DIAGNOSIS — L03317 Cellulitis of buttock: Secondary | ICD-10-CM

## 2012-11-15 MED ORDER — DOXYCYCLINE HYCLATE 100 MG PO TABS: 100.0000 mg | ORAL_TABLET | Freq: Two times a day (BID) | ORAL | Status: DC

## 2012-11-15 NOTE — Patient Instructions (Addendum)
Please continue all other medications as before, including the crestor at the 20 mg Please continue your efforts at being more active, low cholesterol diet, and weight control. Please return in 73mo for LAB only (the cholesterol) Plesae return if you change your mind about the flu shot, and the shingles shot. Please take all new medication as prescribed - the antibiotic Please continue all other medications as before, and refills have been done if requested. Please have the pharmacy call with any other refills you may need. Please continue your efforts at being more active, low cholesterol diet, and weight control. You are otherwise up to date with prevention measures today. You are given the work note today  Please return in 1 year for your yearly visit, or sooner if needed, with Lab testing done 3-5 days before

## 2012-11-15 NOTE — Assessment & Plan Note (Signed)

## 2012-11-15 NOTE — Assessment & Plan Note (Signed)
Large incr in ldl, but plans to work on diet, declines incr crestor, cont med as is, f/u lab 3 mo

## 2012-11-15 NOTE — Progress Notes (Signed)
Subjective:    Patient ID: Charles [name], male    DOB: 05/29/1950, 62 y.o.   MRN: [id number]  HPI  Here for wellness and f/u;  Overall doing ok;  Pt denies CP, worsening SOB, DOE, wheezing, orthopnea, PND, worsening LE edema, palpitations, dizziness or syncope.  Pt denies neurological change such as new headache, facial or extremity weakness.  Pt denies polydipsia, polyuria, or low sugar symptoms. Pt states overall good compliance with treatment and medications, good tolerability, and has been trying to follow lower cholesterol diet.  Pt denies worsening depressive symptoms, suicidal ideation or panic. No fever, night sweats, wt loss, loss of appetite, or other constitutional symptoms.  Pt states good ability with ADL's, has low fall risk, home safety reviewed and adequate, no other significant changes in hearing or vision, and only occasionally active with exercise.  Bp at home has been < 140/90.  Admits to some higher chol diet. Declines immun today.  Has incidental small abscess to right mid natal cleft area, mild tender, no drainage, x 2-3 days Past Medical History  Diagnosis Date  . ABNORMAL ELECTROCARDIOGRAM 11/02/2008  . ABSCESS, LUNG 10/23/2006  . BACTERIAL PNEUMONIA 12/28/2009  . C O P D 05/12/2009  . CHEST PAIN 12/24/2009  . Cramp of limb 07/19/2007  . DIVERTICULOSIS, COLON 03/07/2007  . 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/23/2006  . FLANK PAIN, LEFT 02/25/2010  . FREQUENCY, URINARY 12/24/2009  . GERD 10/23/2006  . HEMORRHOIDS 10/23/2006  . HYPERLIPIDEMIA 10/23/2006  . HYPERTENSION 10/23/2006  . PLANTAR FASCIITIS, LEFT 11/02/2008  . PSA, INCREASED 11/20/2007  . PULMONARY NODULE 05/14/2008  . Unspecified Peripheral Vascular Disease 10/23/2006  . BPH (benign prostatic hyperplasia) 11/22/2010   Past Surgical History  Procedure Laterality Date  . Hernia repair    . Hemorrhoid surgery      reports that he has quit smoking. He does not have any smokeless tobacco history on file. He reports that he does not drink  alcohol or use illicit drugs. family history includes Heart disease in his father and mother; Kidney disease in his sister. No Known Allergies Current Outpatient Prescriptions on File Prior to Visit  Medication Sig Dispense Refill  . amLODipine (NORVASC) 10 MG tablet Take 1 tablet (10 mg total) by mouth daily.  90 tablet  3  . aspirin 325 MG EC tablet Take 325 mg by mouth daily.        Marland Kitchen ezetimibe (ZETIA) 10 MG tablet Take 1 tablet (10 mg total) by mouth daily.  90 tablet  3  . lisinopril (PRINIVIL,ZESTRIL) 20 MG tablet Take 1 tablet (20 mg total) by mouth daily.  90 tablet  3  . rosuvastatin (CRESTOR) 20 MG tablet Take 1 tablet (20 mg total) by mouth daily.  90 tablet  3  . sildenafil (VIAGRA) 100 MG tablet Take 1 tablet (100 mg total) by mouth daily as needed.  10 tablet  5  . SUMAtriptan (IMITREX) 100 MG tablet Take 1 tablet (100 mg total) by mouth every 2 (two) hours as needed for migraine.  10 tablet  5   No current facility-administered medications on file prior to visit.    Review of Systems Constitutional: Negative for diaphoresis, activity change, appetite change or unexpected weight change.  HENT: Negative for hearing loss, ear pain, facial swelling, mouth sores and neck stiffness.   Eyes: Negative for pain, redness and visual disturbance.  Respiratory: Negative for shortness of breath and wheezing.   Cardiovascular: Negative for chest pain and palpitations.  Gastrointestinal: Negative  for diarrhea, blood in stool, abdominal distention or other pain Genitourinary: Negative for hematuria, flank pain or change in urine volume.  Musculoskeletal: Negative for myalgias and joint swelling.  Skin: Negative for color change and wound.  Neurological: Negative for syncope and numbness. other than noted Hematological: Negative for adenopathy.  Psychiatric/Behavioral: Negative for hallucinations, self-injury, decreased concentration and agitation.      Objective:   Physical Exam BP  142/82  Pulse 76  Temp(Src) 99.9 F (37.7 C) (Oral)  Ht 6\' 4"  (1.93 m)  Wt 181 lb (82.101 kg)  BMI 22.04 kg/m2  SpO2 97% VS noted,  Constitutional: Pt appears well-developed and well-nourished.  HENT: Head: NCAT.  Right Ear: External ear normal.  Left Ear: External ear normal.  Eyes: Conjunctivae and EOM are normal. Pupils are equal, round, and reactive to light.  Neck: Normal range of motion. Neck supple.  Cardiovascular: Normal rate and regular rhythm.   Pulmonary/Chest: Effort normal and breath sounds normal.  Abd:  Soft, NT, non-distended, + BS Neurological: Pt is alert. Not confused  Skin: Skin is warm. No erythema.  Psychiatric: Pt behavior is normal. Thought content normal.  Right natal cleft with 10 mm area mild tender, red/swelling without drainaige  Drinks 1/4 pint vodka per day    Assessment & Plan:

## 2012-11-17 DIAGNOSIS — Z7289 Other problems related to lifestyle: Secondary | ICD-10-CM | POA: Insufficient documentation

## 2012-11-17 NOTE — Assessment & Plan Note (Signed)
Now at 1/4 pint vodka per day, urged moderation, or stopping if possible

## 2012-11-17 NOTE — Assessment & Plan Note (Signed)
Mild to mod, for antibx course,  to f/u any worsening symptoms or concerns 

## 2012-11-17 NOTE — Assessment & Plan Note (Signed)
Asympt, for a1c next labs,  to f/u any worsening symptoms or concerns

## 2012-12-05 ENCOUNTER — Other Ambulatory Visit: Payer: Self-pay | Admitting: Internal Medicine

## 2012-12-10 ENCOUNTER — Other Ambulatory Visit: Payer: Self-pay | Admitting: Internal Medicine

## 2012-12-18 ENCOUNTER — Ambulatory Visit (INDEPENDENT_AMBULATORY_CARE_PROVIDER_SITE_OTHER): Payer: No Typology Code available for payment source | Admitting: Internal Medicine

## 2012-12-18 ENCOUNTER — Encounter: Payer: Self-pay | Admitting: Internal Medicine

## 2012-12-18 VITALS — BP 140/82 | HR 75 | Temp 98.4°F | Ht 76.0 in | Wt 183.0 lb

## 2012-12-18 DIAGNOSIS — K6289 Other specified diseases of anus and rectum: Secondary | ICD-10-CM | POA: Insufficient documentation

## 2012-12-18 MED ORDER — HYDROCORTISONE ACETATE 25 MG RE SUPP: 25.0000 mg | Freq: Two times a day (BID) | RECTAL | Status: DC

## 2012-12-18 NOTE — Progress Notes (Signed)
Subjective:    Patient ID: Charles [name], male    DOB: 02/07/1951, 62 y.o.   MRN: [id number]  HPI  Here to f/u with 1 wk c/o anal strange feeling, mild discomfort at worse and ? Spasm like quality with cough or sneeze, some worse to sit as well, has hx of hemorrhoid surgury x 2 several yrs ago, no blood, constipation or other bowel change;  No fever, swelling, Recent infection resolved with antibx.   Past Medical History  Diagnosis Date  . ABNORMAL ELECTROCARDIOGRAM 11/02/2008  . ABSCESS, LUNG 10/23/2006  . BACTERIAL PNEUMONIA 12/28/2009  . C O P D 05/12/2009  . CHEST PAIN 12/24/2009  . Cramp of limb 07/19/2007  . DIVERTICULOSIS, COLON 03/07/2007  . 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/23/2006  . FLANK PAIN, LEFT 02/25/2010  . FREQUENCY, URINARY 12/24/2009  . GERD 10/23/2006  . HEMORRHOIDS 10/23/2006  . HYPERLIPIDEMIA 10/23/2006  . HYPERTENSION 10/23/2006  . PLANTAR FASCIITIS, LEFT 11/02/2008  . PSA, INCREASED 11/20/2007  . PULMONARY NODULE 05/14/2008  . Unspecified Peripheral Vascular Disease 10/23/2006  . BPH (benign prostatic hyperplasia) 11/22/2010   Past Surgical History  Procedure Laterality Date  . Hernia repair    . Hemorrhoid surgery      reports that he has quit smoking. He does not have any smokeless tobacco history on file. He reports that he does not drink alcohol or use illicit drugs. family history includes Heart disease in his father and mother; Kidney disease in his sister. No Known Allergies Current Outpatient Prescriptions on File Prior to Visit  Medication Sig Dispense Refill  . amLODipine (NORVASC) 10 MG tablet TAKE 1 TABLET (10 MG TOTAL) BY MOUTH DAILY.  90 tablet  3  . aspirin 325 MG EC tablet Take 325 mg by mouth daily.        . CRESTOR 20 MG tablet TAKE 1 TABLET (20 MG TOTAL) BY MOUTH DAILY.  90 tablet  3  . lisinopril (PRINIVIL,ZESTRIL) 20 MG tablet TAKE 1 TABLET (20 MG TOTAL) BY MOUTH DAILY.  90 tablet  3  . SUMAtriptan (IMITREX) 100 MG tablet Take 1 tablet (100 mg total) by mouth every  2 (two) hours as needed for migraine.  10 tablet  5  . VIAGRA 100 MG tablet TAKE 1 TABLET (100 MG TOTAL) BY MOUTH DAILY AS NEEDED.  10 tablet  5  . ZETIA 10 MG tablet TAKE 1 TABLET (10 MG TOTAL) BY MOUTH DAILY.  90 tablet  3   No current facility-administered medications on file prior to visit.   Review of Systems All otherwise neg per pt     Objective:   Physical Exam BP 140/82  Pulse 75  Temp(Src) 98.4 F (36.9 C) (Oral)  Ht 6\' 4"  (1.93 m)  Wt 183 lb (83.008 kg)  BMI 22.28 kg/m2  SpO2 97% VS noted,  Constitutional: Pt appears well-developed and well-nourished.  HENT: Head: NCAT.  Right Ear: External ear normal.  Left Ear: External ear normal.  Eyes: Conjunctivae and EOM are normal. Pupils are equal, round, and reactive to light.  Neck: Normal range of motion. Neck supple.  Cardiovascular: Normal rate and regular rhythm.   Pulmonary/Chest: Effort normal and breath sounds normal.  Abd:  Soft, NT, non-distended, + BS Neurological: Pt is alert. Not confused  Skin: Skin is warm. No erythema.  Psychiatric: Pt behavior is normal. Thought content normal.  DRE: normal tone, prostate normal size/no nodule, exam essentially nontender, has several hemorrhoidal tags, heme neg    Assessment & Plan:

## 2012-12-18 NOTE — Assessment & Plan Note (Signed)
Visual exam and DRE seems benign, suspect ? Very mild proctitis or recurrent int hemorrhoid - for anusol HC supp bid prn,  to f/u any worsening symptoms or concerns

## 2012-12-18 NOTE — Patient Instructions (Signed)
Please take all new medication as prescribed Please continue all other medications as before, and refills have been done if requested. Please have the pharmacy call with any other refills you may need.  Please remember to sign up for My Chart if you have not done so, as this will be important to you in the future with finding out test results, communicating by private email, and scheduling acute appointments online when needed.   

## 2013-02-18 ENCOUNTER — Other Ambulatory Visit: Payer: Self-pay | Admitting: Internal Medicine

## 2013-03-31 ENCOUNTER — Other Ambulatory Visit: Payer: Self-pay | Admitting: Internal Medicine

## 2013-05-06 ENCOUNTER — Other Ambulatory Visit: Payer: Self-pay | Admitting: Internal Medicine

## 2013-07-25 ENCOUNTER — Ambulatory Visit (INDEPENDENT_AMBULATORY_CARE_PROVIDER_SITE_OTHER): Payer: No Typology Code available for payment source | Admitting: Internal Medicine

## 2013-07-25 ENCOUNTER — Encounter: Payer: Self-pay | Admitting: Internal Medicine

## 2013-07-25 VITALS — BP 118/72 | HR 77 | Temp 98.1°F | Ht 76.0 in | Wt 179.2 lb

## 2013-07-25 DIAGNOSIS — L989 Disorder of the skin and subcutaneous tissue, unspecified: Secondary | ICD-10-CM

## 2013-07-25 DIAGNOSIS — R7309 Other abnormal glucose: Secondary | ICD-10-CM

## 2013-07-25 DIAGNOSIS — Z7289 Other problems related to lifestyle: Secondary | ICD-10-CM

## 2013-07-25 DIAGNOSIS — F1090 Alcohol use, unspecified, uncomplicated: Secondary | ICD-10-CM

## 2013-07-25 DIAGNOSIS — F1010 Alcohol abuse, uncomplicated: Secondary | ICD-10-CM

## 2013-07-25 DIAGNOSIS — Z0000 Encounter for general adult medical examination without abnormal findings: Secondary | ICD-10-CM

## 2013-07-25 DIAGNOSIS — R7302 Impaired glucose tolerance (oral): Secondary | ICD-10-CM

## 2013-07-25 DIAGNOSIS — I10 Essential (primary) hypertension: Secondary | ICD-10-CM

## 2013-07-25 DIAGNOSIS — M25571 Pain in right ankle and joints of right foot: Secondary | ICD-10-CM | POA: Insufficient documentation

## 2013-07-25 NOTE — Assessment & Plan Note (Signed)
Abstinent per pt,  to f/u any worsening symptoms or concerns

## 2013-07-25 NOTE — Progress Notes (Signed)
Pre visit review using our clinic review tool, if applicable. No additional management support is needed unless otherwise documented below in the visit note. 

## 2013-07-25 NOTE — Assessment & Plan Note (Signed)
stable overall by history and exam, recent data reviewed with pt, and pt to continue medical treatment as before,  to f/u any worsening symptoms or concerns ble BP Readings from Last 3 Encounters:  07/25/13 118/72  12/18/12 140/82  11/15/12 142/82

## 2013-07-25 NOTE — Patient Instructions (Addendum)
Please continue all other medications as before, and refills have been done if requested.  Please have the pharmacy call with any other refills you may need.  Please continue your efforts at being more active, low cholesterol diet, and weight control.  Please keep your appointments with your specialists as you may have planned  OK to continue to monitor the sore on your ankle, as it should eventually heal in the next few weeks  You are given the work note today  Please return in 3 months, or sooner if needed, with Lab testing done 3-5 days before

## 2013-07-25 NOTE — Progress Notes (Signed)
Subjective:    Patient ID: Charles [name], male    DOB: 1950-10-28, 63 y.o.   MRN: [id number]  HPI  Here to f/u skin ulceration directly over the right ankle medial malleolus, present x 2 wks, likely happened with a minor injury at the [place] pushing and pulling things, concerned b/c not completely healed.  No fever, red streaks, redness, pain, swelling or drainage. Scabbed but seems to be taking a long time to heal, has known PVD. Also States stopped ETOH > 1 mo.   Pt denies fever, wt loss, night sweats, loss of appetite, or other constitutional symptoms Denies worsening depressive symptoms, suicidal ideation, or panic; has ongoing anxiety, not increased recently.  Past Medical History  Diagnosis Date  . ABNORMAL ELECTROCARDIOGRAM 11/02/2008  . ABSCESS, LUNG 10/23/2006  . BACTERIAL PNEUMONIA 12/28/2009  . C O P D 05/12/2009  . CHEST PAIN 12/24/2009  . Cramp of limb 07/19/2007  . DIVERTICULOSIS, COLON 03/07/2007  . 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/23/2006  . FLANK PAIN, LEFT 02/25/2010  . FREQUENCY, URINARY 12/24/2009  . GERD 10/23/2006  . HEMORRHOIDS 10/23/2006  . HYPERLIPIDEMIA 10/23/2006  . HYPERTENSION 10/23/2006  . PLANTAR FASCIITIS, LEFT 11/02/2008  . PSA, INCREASED 11/20/2007  . PULMONARY NODULE 05/14/2008  . Unspecified Peripheral Vascular Disease 10/23/2006  . BPH (benign prostatic hyperplasia) 11/22/2010   Past Surgical History  Procedure Laterality Date  . Hernia repair    . Hemorrhoid surgery      reports that he has quit smoking. He does not have any smokeless tobacco history on file. He reports that he does not drink alcohol or use illicit drugs. family history includes Heart disease in his father and mother; Kidney disease in his sister. No Known Allergies Current Outpatient Prescriptions on File Prior to Visit  Medication Sig Dispense Refill  . amLODipine (NORVASC) 10 MG tablet TAKE 1 TABLET (10 MG TOTAL) BY MOUTH DAILY.  90 tablet  3  . amLODipine (NORVASC) 10 MG tablet TAKE 1 TABLET (10  MG TOTAL) BY MOUTH DAILY.  90 tablet  3  . aspirin 325 MG EC tablet Take 325 mg by mouth daily.        . CRESTOR 20 MG tablet TAKE 1 TABLET (20 MG TOTAL) BY MOUTH DAILY.  90 tablet  3  . CRESTOR 20 MG tablet TAKE 1 TABLET (20 MG TOTAL) BY MOUTH DAILY.  90 tablet  2  . lisinopril (PRINIVIL,ZESTRIL) 20 MG tablet TAKE 1 TABLET (20 MG TOTAL) BY MOUTH DAILY.  90 tablet  3  . VIAGRA 100 MG tablet TAKE 1 TABLET (100 MG TOTAL) BY MOUTH DAILY AS NEEDED.  10 tablet  5  . ZETIA 10 MG tablet TAKE 1 TABLET (10 MG TOTAL) BY MOUTH DAILY.  90 tablet  3   No current facility-administered medications on file prior to visit.    Review of Systems  Constitutional: Negative for unusual diaphoresis or other sweats  HENT: Negative for ringing in ear Eyes: Negative for double vision or worsening visual disturbance.  Respiratory: Negative for choking and stridor.   Gastrointestinal: Negative for vomiting or other signifcant bowel change Genitourinary: Negative for hematuria or decreased urine volume.  Musculoskeletal: Negative for other MSK pain or swelling Skin: Negative for color change and worsening wound.  Neurological: Negative for tremors and numbness other than noted  Psychiatric/Behavioral: Negative for decreased concentration or agitation other than above       Objective:   Physical Exam BP 118/72  Pulse 77  Temp(Src)  98.1 F (36.7 C) (Oral)  Ht 6\' 4"  (1.93 m)  Wt 179 lb 4 oz (81.307 kg)  BMI 21.83 kg/m2  SpO2 97% VS noted,  Constitutional: Pt appears well-developed, well-nourished.  HENT: Head: NCAT.  Right Ear: External ear normal.  Left Ear: External ear normal.  Eyes: . Pupils are equal, round, and reactive to light. Conjunctivae and EOM are normal Neck: Normal range of motion. Neck supple.  Cardiovascular: Normal rate and regular rhythm.   Pulmonary/Chest: Effort normal and breath sounds normal.  Abd:  Soft, NT, ND, + BS Neurological: Pt is alert. Not confused , motor grossly  intact Skin: right medial malleolar area with approx 15 mm scabbed shallow ulcer without significant tender, red, swelling, red streaks or other abnormal Psychiatric: Pt behavior is normal. No agitation. mild nervous    Assessment & Plan:

## 2013-07-25 NOTE — Assessment & Plan Note (Signed)
stable overall by history and exam, recent data reviewed with pt, and pt to continue medical treatment as before,  to f/u any worsening symptoms or concerns Lab Results  Component Value Date   HGBA1C 4.5* 11/05/2012

## 2013-07-25 NOTE — Assessment & Plan Note (Signed)
Directly over the right medial malleolar area, non inflamed, scabbed, no evidence for infection or abscess, ok to cont to follow, should heal further in 2-3 wks

## 2013-07-28 ENCOUNTER — Telehealth: Payer: Self-pay | Admitting: Internal Medicine

## 2013-07-28 NOTE — Telephone Encounter (Signed)
Relevant patient education assigned to patient using Emmi. ° °

## 2013-09-17 ENCOUNTER — Encounter: Payer: Self-pay | Admitting: Family Medicine

## 2013-09-17 ENCOUNTER — Encounter: Payer: Self-pay | Admitting: *Deleted

## 2013-09-17 ENCOUNTER — Ambulatory Visit (INDEPENDENT_AMBULATORY_CARE_PROVIDER_SITE_OTHER): Payer: No Typology Code available for payment source | Admitting: Family Medicine

## 2013-09-17 ENCOUNTER — Other Ambulatory Visit (INDEPENDENT_AMBULATORY_CARE_PROVIDER_SITE_OTHER): Payer: No Typology Code available for payment source

## 2013-09-17 VITALS — BP 142/88 | HR 82 | Ht 76.0 in | Wt 168.0 lb

## 2013-09-17 DIAGNOSIS — S46001A Unspecified injury of muscle(s) and tendon(s) of the rotator cuff of right shoulder, initial encounter: Secondary | ICD-10-CM

## 2013-09-17 DIAGNOSIS — S46009A Unspecified injury of muscle(s) and tendon(s) of the rotator cuff of unspecified shoulder, initial encounter: Secondary | ICD-10-CM | POA: Insufficient documentation

## 2013-09-17 DIAGNOSIS — M25519 Pain in unspecified shoulder: Secondary | ICD-10-CM

## 2013-09-17 DIAGNOSIS — M25511 Pain in right shoulder: Secondary | ICD-10-CM

## 2013-09-17 DIAGNOSIS — S4980XA Other specified injuries of shoulder and upper arm, unspecified arm, initial encounter: Secondary | ICD-10-CM

## 2013-09-17 DIAGNOSIS — S46909A Unspecified injury of unspecified muscle, fascia and tendon at shoulder and upper arm level, unspecified arm, initial encounter: Secondary | ICD-10-CM

## 2013-09-17 MED ORDER — MELOXICAM 15 MG PO TABS: 15.0000 mg | ORAL_TABLET | Freq: Every day | ORAL | Status: DC

## 2013-09-17 NOTE — Patient Instructions (Addendum)
Good to meet you Ice 20 minutes 2 times daily. Usually after activity and before bed. Exercises 3 times a week.  Meloxicam daily for 10 day sthen as needed.  See you in 1 week.

## 2013-09-17 NOTE — Progress Notes (Signed)
[name] [address] [place], [place] [address] Phone: [phone] Subjective:    I'm seeing this patient by the request  of:  Cathlean Cower, MD   CC: Right shoulder pain  [id number] Charles [name] is a 63 y.o. male coming in with complaint of right shoulder pain for one day duration. Patient was trying to swat a hornet. And he felt a severe pain in his shoulder almost immediately. Patient states he was able to sleep but then this morning he did have significant stiffness and pain with certain motions such as reaching above his head. Patient describes the pain as more of a dull aching sensation in with the severity of 7/10. Patient has not tried any home modalities at this time. No history of previous shoulder injuries. Patient denies any neck pain. 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      Past medical history, social, surgical and family history all reviewed in electronic medical record.   Review of Systems: No headache, visual changes, nausea, vomiting, diarrhea, constipation, dizziness, abdominal pain, skin rash, fevers, chills, night sweats, weight loss, swollen lymph nodes, body aches, joint swelling, muscle aches, chest pain, shortness of breath, mood changes.   Objective Blood pressure 142/88, pulse 82, height 6\' 4"  (1.93 m), weight 168 lb (76.204 kg), SpO2 98.00%.  General: No apparent distress alert and oriented x3 mood and affect normal, dressed appropriately.  HEENT: Pupils equal, extraocular movements intact  Respiratory: Patient's speak in full sentences and does not appear short of breath  Cardiovascular: No lower extremity edema, non tender, no erythema  Skin: Warm dry intact with no signs of infection or rash on extremities or on axial skeleton.  Abdomen: Soft nontender  Neuro: Cranial nerves II through XII are intact, neurovascularly intact in all  extremities with 2+ DTRs and 2+ pulses.  Lymph: No lymphadenopathy of posterior or anterior cervical chain or axillae bilaterally.  Gait normal with good balance and coordination.  MSK:  Non tender with full range of motion and good stability and symmetric strength and tone of shoulders, elbows, wrist, hip, knee and ankles bilaterally.  Shoulder: Right Inspection reveals no abnormalities, atrophy or asymmetry. Palpation is normal with no tenderness over AC joint or bicipital groove. ROM is full in all planes passively. Rotator cuff strength 4-5 compared to 5 out of 5 the contralateral side signs of impingement with positive Neer and Hawkin's tests, but negative empty can sign. Speeds and Yergason's tests normal. No labral pathology noted with negative Obrien's, negative clunk and good stability. Normal scapular function observed. No painful arc and no drop arm sign. No apprehension sign Contralateral shoulder unremarkable  MSK US performed of: Right This study was ordered, performed, and interpreted by Charlann Boxer D.O.  Shoulder:   Supraspinatus:  Nonspecific hypoechoic changes but increasing Doppler flow. Infraspinatus:  Appears normal on long and transverse views. Significant increase in Doppler flow Subscapularis:  Appears normal on long and transverse views. Positive bursa Teres Minor:  Appears normal on long and transverse views. AC joint:  Capsule undistended, no geyser sign. Glenohumeral Joint:  Appears normal without effusion. Glenoid Labrum:  Intact without visualized tears. Biceps Tendon:  Appears normal on long and transverse views, no fraying of tendon, tendon located in intertubercular groove, no subluxation with shoulder internal or external rotation.  Impression: Nonspecific hypoechoic changes with increasing up to a rotator  cuff but no true tear appreciated.      Impression and Recommendations:     This case required medical decision making of moderate  complexity.

## 2013-09-17 NOTE — Assessment & Plan Note (Signed)
I believe the patient probably has more of a rotator cuff strain. There is nonspecific swelling within the shoulder but there is no true tear of the rotator cuff or bursitis noted. We discussed anti-inflammatories for 10 days and prescription was given, icing protocol, and home exercise program to increase range of motion. Patient will be out of work until Saturday and then start with light duty until I see him again in one week. If patient is doing better rule release him to full duty but if he has worsening pain I need to consider intra-articular injection.

## 2013-09-24 ENCOUNTER — Ambulatory Visit (INDEPENDENT_AMBULATORY_CARE_PROVIDER_SITE_OTHER): Payer: No Typology Code available for payment source | Admitting: Family Medicine

## 2013-09-24 ENCOUNTER — Encounter: Payer: Self-pay | Admitting: *Deleted

## 2013-09-24 ENCOUNTER — Encounter: Payer: Self-pay | Admitting: Family Medicine

## 2013-09-24 VITALS — BP 151/87 | HR 76 | Ht 76.0 in | Wt 170.0 lb

## 2013-09-24 DIAGNOSIS — S46909A Unspecified injury of unspecified muscle, fascia and tendon at shoulder and upper arm level, unspecified arm, initial encounter: Secondary | ICD-10-CM

## 2013-09-24 DIAGNOSIS — Z5189 Encounter for other specified aftercare: Secondary | ICD-10-CM

## 2013-09-24 DIAGNOSIS — S4980XA Other specified injuries of shoulder and upper arm, unspecified arm, initial encounter: Secondary | ICD-10-CM

## 2013-09-24 DIAGNOSIS — S46001D Unspecified injury of muscle(s) and tendon(s) of the rotator cuff of right shoulder, subsequent encounter: Secondary | ICD-10-CM

## 2013-09-24 NOTE — Patient Instructions (Addendum)
Good to see you! Good that right shoulder is better Continue the ice Try the pennsaid twice daily until gone Can continue the meloxicam if needed Light duty at work for 1 week then regular duty If not perfect come back in 2 weeks.

## 2013-09-24 NOTE — Progress Notes (Signed)
  [name] [address], [place] [address] Phone: [phone] Subjective:    I'm seeing this patient by the request  of:  Cathlean Cower, MD   CC: Right shoulder pain  PNT:[id number] FIDENCIO DUDDY is a 63 y.o. male coming in with complaint of right shoulder pain for over one week. Patient is following up for what likely was a rotator cuff strain. Patient's ultrasound did not show any true tear. Patient has been out of work for the last week. Patient has been doing the icing as well as the medications. Patient states that it is approximately 60% better than patient denies any radiation of the arm. Patient states that he has some pain with abduction but overall is able to do daily activities. Patient is wondering if he can return to work. No nighttime awakening.      Past medical history, social, surgical and family history all reviewed in electronic medical record.   Review of Systems: No headache, visual changes, nausea, vomiting, diarrhea, constipation, dizziness, abdominal pain, skin rash, fevers, chills, night sweats, weight loss, swollen lymph nodes, body aches, joint swelling, muscle aches, chest pain, shortness of breath, mood changes.   Objective Blood pressure 151/87, pulse 76, height 6\' 4"  (1.93 m), weight 170 lb (77.111 kg).  General: No apparent distress alert and oriented x3 mood and affect normal, dressed appropriately.  HEENT: Pupils equal, extraocular movements intact  Respiratory: Patient's speak in full sentences and does not appear short of breath  Cardiovascular: No lower extremity edema, non tender, no erythema  Skin: Warm dry intact with no signs of infection or rash on extremities or on axial skeleton.  Abdomen: Soft nontender  Neuro: Cranial nerves II through XII are intact, neurovascularly intact in all extremities with 2+ DTRs and 2+ pulses.  Lymph: No lymphadenopathy of posterior or anterior cervical chain or axillae  bilaterally.  Gait normal with good balance and coordination.  MSK:  Non tender with full range of motion and good stability and symmetric strength and tone of shoulders, elbows, wrist, hip, knee and ankles bilaterally.  Shoulder: Right Inspection reveals no abnormalities, atrophy or asymmetry. Palpation is normal with no tenderness over AC joint or bicipital groove. ROM is full in all planes passively. Rotator cuff strength is 5 out of 5 signs of impingement with positive Neer and Hawkin's tests, but negative empty can sign. No significant change from previous exam Speeds and Yergason's tests normal. No labral pathology noted with negative Obrien's, negative clunk and good stability. Normal scapular function observed. No painful arc and no drop arm sign. No apprehension sign Contralateral shoulder unremarkable    Impression and Recommendations:     This case required medical decision making of moderate complexity.

## 2013-09-24 NOTE — Assessment & Plan Note (Signed)
Patient is doing very well with conservative therapy. We will have patient return to work on light duty for the next week. We discussed continuing the icing regimen and was given a topical anti-inflammatory to try slowly. This will only last for approximately 1 week. Discussed stopping the meloxicam if he does not think this is beneficial. Patient did well come back in 2 weeks if he is not doing much better. At that time we will re ultrasound to make sure that everything looks normal.

## 2013-10-02 ENCOUNTER — Encounter: Payer: Self-pay | Admitting: *Deleted

## 2013-10-02 ENCOUNTER — Other Ambulatory Visit (INDEPENDENT_AMBULATORY_CARE_PROVIDER_SITE_OTHER): Payer: No Typology Code available for payment source

## 2013-10-02 ENCOUNTER — Encounter: Payer: Self-pay | Admitting: Family Medicine

## 2013-10-02 ENCOUNTER — Ambulatory Visit (INDEPENDENT_AMBULATORY_CARE_PROVIDER_SITE_OTHER)
Admission: RE | Admit: 2013-10-02 | Discharge: 2013-10-02 | Disposition: A | Discharge status: Home / Self Care | Payer: No Typology Code available for payment source | Source: Ambulatory Visit | Attending: Family Medicine | Admitting: Family Medicine

## 2013-10-02 ENCOUNTER — Ambulatory Visit (INDEPENDENT_AMBULATORY_CARE_PROVIDER_SITE_OTHER): Payer: No Typology Code available for payment source | Admitting: Family Medicine

## 2013-10-02 VITALS — BP 140/84 | HR 80 | Ht 76.0 in | Wt 169.0 lb

## 2013-10-02 DIAGNOSIS — M25511 Pain in right shoulder: Secondary | ICD-10-CM

## 2013-10-02 DIAGNOSIS — S46001D Unspecified injury of muscle(s) and tendon(s) of the rotator cuff of right shoulder, subsequent encounter: Secondary | ICD-10-CM

## 2013-10-02 DIAGNOSIS — M25519 Pain in unspecified shoulder: Secondary | ICD-10-CM

## 2013-10-02 DIAGNOSIS — M629 Disorder of muscle, unspecified: Secondary | ICD-10-CM

## 2013-10-02 DIAGNOSIS — M2420 Disorder of ligament, unspecified site: Secondary | ICD-10-CM

## 2013-10-02 DIAGNOSIS — M6289 Other specified disorders of muscle: Secondary | ICD-10-CM | POA: Insufficient documentation

## 2013-10-02 DIAGNOSIS — Z5189 Encounter for other specified aftercare: Secondary | ICD-10-CM

## 2013-10-02 DIAGNOSIS — S46909A Unspecified injury of unspecified muscle, fascia and tendon at shoulder and upper arm level, unspecified arm, initial encounter: Secondary | ICD-10-CM

## 2013-10-02 DIAGNOSIS — S4980XA Other specified injuries of shoulder and upper arm, unspecified arm, initial encounter: Secondary | ICD-10-CM

## 2013-10-02 NOTE — Assessment & Plan Note (Addendum)
Deeply the patient is having increased subacromial bursitis. Patient did respond well to the injection with increasing range of motion and decreased pain. I'm hoping patient does well. Patient is going to start exercises again in 48 hours. We discussed an icing protocol. Patient will call us in 72 hours and is continuing to have pain I would consider further imaging. X-rays were ordered today. Patient continues to have difficulty we need to consider the possibility of a labral tear and that is why further imaging may be necessary. If MRI is ordered that he would have to come back and see me again 1-2 days after the MRI for further discussion on treatment. Patient is out of work another week.  Spent greater than 25 minutes with patient face-to-face and had greater than 50% of counseling including as described above in assessment and plan.

## 2013-10-02 NOTE — Patient Instructions (Addendum)
Good to see you Injection in shoulder today.  Xrays downstairs today.  No exercises today or tomorrow but then start 3 times a week.  Ice is your friend Out of work for 1 week.  Call me  onday if pain is stil lbad then we will do MRI and I would want to see you again 1-2 days after mri to discuss finding.

## 2013-10-02 NOTE — Progress Notes (Signed)
[name] [address] [place], [place] [address] Phone: [phone] Subjective:    CC: Right shoulder pain  UXN:[id number] PRINCESTON BLIZZARD is a 63 y.o. male coming in with complaint of right shoulder pain  Follow up.  Patient was seen previously and did have more of a rotator cuff strain. Patient seem to be improving but now is worsening again. Patient states that he was pushing his lawnmower and hit a root causing him to stop and had pain immediately. Patient states the pain is still radiating somewhat down his arm. Patient states that he has noticed some increased weakness. Patient has had to call out of work secondary to the pain.      Past medical history, social, surgical and family history all reviewed in electronic medical record.   Review of Systems: No headache, visual changes, nausea, vomiting, diarrhea, constipation, dizziness, abdominal pain, skin rash, fevers, chills, night sweats, weight loss, swollen lymph nodes, body aches, joint swelling, muscle aches, chest pain, shortness of breath, mood changes.   Objective Blood pressure 140/84, pulse 80, height 6\' 4"  (1.93 m), weight 169 lb (76.658 kg), SpO2 98.00%.  General: No apparent distress alert and oriented x3 mood and affect normal, dressed appropriately. Mild mask facies.  HEENT: Pupils equal, extraocular movements intact  Respiratory: Patient's speak in full sentences and does not appear short of breath  Cardiovascular: No lower extremity edema, non tender, no erythema  Skin: Warm dry intact with no signs of infection or rash on extremities or on axial skeleton.  Abdomen: Soft nontender  Neuro: Cranial nerves II through XII are intact, neurovascularly intact in all extremities with 2+ DTRs and 2+ pulses.  Lymph: No lymphadenopathy of posterior or anterior cervical chain or axillae bilaterally.  Gait normal with good balance and coordination.  MSK:  Non tender with full range of motion and  good stability and symmetric strength and tone of  elbows, wrist, hip, knee and ankles bilaterally. Patient does have mild cogwheeling of multiple joint Shoulder: Right Inspection reveals no abnormalities, atrophy or asymmetry. Palpation is normal with no tenderness over AC joint or bicipital groove. ROM is full in all planes passively but pain with greater then 50 degrees of forward flexion. . Rotator cuff strength is 5 out of 5 signs of impingement with positive Neer and Hawkin's tests, but negative empty can sign. No significant change from previous exam Speeds and Yergason's tests normal. No labral pathology noted with negative Obrien's, negative clunk and good stability. Normal scapular function observed. No painful arc and no drop arm sign. No apprehension sign Contralateral shoulder unremarkable  Procedure: Real-time Ultrasound Guided Injection of right glenohumeral joint Device: GE Logiq E  Ultrasound guided injection is preferred based studies that show increased duration, increased effect, greater accuracy, decreased procedural pain, increased response rate with ultrasound guided versus blind injection.  Verbal informed consent obtained.  Time-out conducted.  Noted no overlying erythema, induration, or other signs of local infection.  Skin prepped in a sterile fashion.  Local anesthesia: Topical Ethyl chloride.  With sterile technique and under real time ultrasound guidance:  Joint visualized.  23g 1  inch needle inserted posterior approach. Pictures taken for needle placement. Patient did have injection of 2 cc of 1% lidocaine, 2 cc of 0.5% Marcaine, and 1.0 cc of Kenalog 40 mg/dL. Completed without difficulty  Pain immediately resolved suggesting accurate placement of the medication.  Advised to call if fevers/chills, erythema, induration, drainage, or persistent bleeding.  Images permanently stored and available for review in the ultrasound unit.  Impression: Technically  successful ultrasound guided injection.    Impression and Recommendations:     This case required medical decision making of moderate complexity.

## 2013-10-02 NOTE — Assessment & Plan Note (Signed)
Patient does have some muscle tightness at baseline. This could be secondary to anxiety but we may need to consider further workup for neurologic conditions such as Parkinson's or Huntington's if he continues. Will discuss further with primary care provider.

## 2013-10-30 ENCOUNTER — Ambulatory Visit (INDEPENDENT_AMBULATORY_CARE_PROVIDER_SITE_OTHER): Payer: No Typology Code available for payment source | Admitting: Internal Medicine

## 2013-10-30 ENCOUNTER — Encounter: Payer: Self-pay | Admitting: Internal Medicine

## 2013-10-30 VITALS — BP 150/80 | HR 90 | Temp 98.9°F | Wt 170.1 lb

## 2013-10-30 DIAGNOSIS — R7302 Impaired glucose tolerance (oral): Secondary | ICD-10-CM

## 2013-10-30 DIAGNOSIS — I10 Essential (primary) hypertension: Secondary | ICD-10-CM

## 2013-10-30 DIAGNOSIS — IMO0001 Reserved for inherently not codable concepts without codable children: Secondary | ICD-10-CM

## 2013-10-30 DIAGNOSIS — R7309 Other abnormal glucose: Secondary | ICD-10-CM

## 2013-10-30 DIAGNOSIS — S46001S Unspecified injury of muscle(s) and tendon(s) of the rotator cuff of right shoulder, sequela: Secondary | ICD-10-CM

## 2013-10-30 MED ORDER — TRAMADOL HCL 50 MG PO TABS: 50.0000 mg | ORAL_TABLET | Freq: Three times a day (TID) | ORAL | Status: DC | PRN

## 2013-10-30 MED ORDER — TRAMADOL HCL 50 MG PO TABS: 50.0000 mg | ORAL_TABLET | Freq: Four times a day (QID) | ORAL | Status: DC | PRN

## 2013-10-30 NOTE — Patient Instructions (Signed)
Please take all new medication as prescribed - the pain medication  You will be contacted regarding the referral for: MRI shoulder - right  Please continue all other medications as before, and refills have been done if requested.  Please have the pharmacy call with any other refills you may need.  Please keep your appointments with your specialists as you may have planned

## 2013-10-30 NOTE — Progress Notes (Signed)
Subjective:    Patient ID: Charles [name], male    DOB: 14-Jun-1950, 63 y.o.   MRN: [id number]  HPIHere to f/u with c/o persistent right shoulder pain, not signficantly better with coritsone inj per Dr Tamala Julian, pain about 6/10, worse some days, intermittent but worse with movement, better with rest, worse to abduct and/or forward elevate.  Pt denies chest pain, increased sob or doe, wheezing, orthopnea, PND, increased LE swelling, palpitations, dizziness or syncope.  Pt denies new neurological symptoms such as new headache, or facial or extremity weakness or numbness ,. Sugars have been slightly elevated mid 100/s with steroid shot.   Past Medical History  Diagnosis Date  . ABNORMAL ELECTROCARDIOGRAM 11/02/2008  . ABSCESS, LUNG 10/23/2006  . BACTERIAL PNEUMONIA 12/28/2009  . C O P D 05/12/2009  . CHEST PAIN 12/24/2009  . Cramp of limb 07/19/2007  . DIVERTICULOSIS, COLON 03/07/2007  . 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/23/2006  . FLANK PAIN, LEFT 02/25/2010  . FREQUENCY, URINARY 12/24/2009  . GERD 10/23/2006  . HEMORRHOIDS 10/23/2006  . HYPERLIPIDEMIA 10/23/2006  . HYPERTENSION 10/23/2006  . PLANTAR FASCIITIS, LEFT 11/02/2008  . PSA, INCREASED 11/20/2007  . PULMONARY NODULE 05/14/2008  . Unspecified Peripheral Vascular Disease 10/23/2006  . BPH (benign prostatic hyperplasia) 11/22/2010   Past Surgical History  Procedure Laterality Date  . Hernia repair    . Hemorrhoid surgery      reports that he has quit smoking. He does not have any smokeless tobacco history on file. He reports that he does not drink alcohol or use illicit drugs. family history includes Heart disease in his father and mother; Kidney disease in his sister. No Known Allergies Current Outpatient Prescriptions on File Prior to Visit  Medication Sig Dispense Refill  . amLODipine (NORVASC) 10 MG tablet TAKE 1 TABLET (10 MG TOTAL) BY MOUTH DAILY.  90 tablet  3  . amLODipine (NORVASC) 10 MG tablet TAKE 1 TABLET (10 MG TOTAL) BY MOUTH DAILY.  90 tablet  3    . aspirin 325 MG EC tablet Take 325 mg by mouth daily.        . CRESTOR 20 MG tablet TAKE 1 TABLET (20 MG TOTAL) BY MOUTH DAILY.  90 tablet  3  . CRESTOR 20 MG tablet TAKE 1 TABLET (20 MG TOTAL) BY MOUTH DAILY.  90 tablet  2  . lisinopril (PRINIVIL,ZESTRIL) 20 MG tablet TAKE 1 TABLET (20 MG TOTAL) BY MOUTH DAILY.  90 tablet  3  . meloxicam (MOBIC) 15 MG tablet Take 1 tablet (15 mg total) by mouth daily.  30 tablet  0  . VIAGRA 100 MG tablet TAKE 1 TABLET (100 MG TOTAL) BY MOUTH DAILY AS NEEDED.  10 tablet  5  . ZETIA 10 MG tablet TAKE 1 TABLET (10 MG TOTAL) BY MOUTH DAILY.  90 tablet  3   No current facility-administered medications on file prior to visit.   Review of Systems  Constitutional: Negative for unusual diaphoresis or other sweats  HENT: Negative for ringing in ear Eyes: Negative for double vision or worsening visual disturbance.  Respiratory: Negative for choking and stridor.   Gastrointestinal: Negative for vomiting or other signifcant bowel change Genitourinary: Negative for hematuria or decreased urine volume.  Musculoskeletal: Negative for other MSK pain or swelling Skin: Negative for color change and worsening wound.  Neurological: Negative for tremors and numbness other than noted  Psychiatric/Behavioral: Negative for decreased concentration or agitation other than above  \     Objective:  Physical Exam BP 150/80  Pulse 90  Temp(Src) 98.9 F (37.2 C) (Oral)  Wt 170 lb 2 oz (77.168 kg)  SpO2 95% VS noted,  Constitutional: Pt appears well-developed, well-nourished.  HENT: Head: NCAT.  Right Ear: External ear normal.  Left Ear: External ear normal.  Eyes: . Pupils are equal, round, and reactive to light. Conjunctivae and EOM are normal Neck: Normal range of motion. Neck supple.  Cardiovascular: Normal rate and regular rhythm.   Pulmonary/Chest: Effort normal and breath sounds normal.  Abd:  Soft, NT, ND, + BS Neurological: Pt is alert. Not confused , motor  grossly intact Skin: Skin is warm. No rash Psychiatric: Pt behavior is normal. No agitation.  Right shoulder with diffuse mild tender, worse pain to forward elev/abduct passive, unable past 90 degrees    Assessment & Plan:

## 2013-10-30 NOTE — Progress Notes (Signed)
Pre visit review using our clinic review tool, if applicable. No additional management support is needed unless otherwise documented below in the visit note. 

## 2013-11-02 NOTE — Assessment & Plan Note (Signed)
Mild elev today, likely reactive, stable overall by history and exam, recent data reviewed with pt, and pt to continue medical treatment as before,  to f/u any worsening symptoms or concerns BP Readings from Last 3 Encounters:  10/30/13 150/80  10/02/13 140/84  09/24/13 151/87

## 2013-11-02 NOTE — Assessment & Plan Note (Signed)
stable overall by history and exam, recent data reviewed with pt, and pt to continue medical treatment as before,  to f/u any worsening symptoms or concerns Lab Results  Component Value Date   HGBA1C 4.5* 11/05/2012

## 2013-11-02 NOTE — Assessment & Plan Note (Addendum)
With persistent worsening pain subjective, abnormal exam for MRI shoulder,may need refer ortho as well pending MRI results

## 2013-11-11 ENCOUNTER — Other Ambulatory Visit: Payer: Self-pay | Admitting: Internal Medicine

## 2013-11-11 ENCOUNTER — Ambulatory Visit
Admission: RE | Admit: 2013-11-11 | Discharge: 2013-11-11 | Disposition: A | Discharge status: Home / Self Care | Payer: No Typology Code available for payment source | Source: Ambulatory Visit | Attending: Internal Medicine | Admitting: Internal Medicine

## 2013-11-11 DIAGNOSIS — S46001S Unspecified injury of muscle(s) and tendon(s) of the rotator cuff of right shoulder, sequela: Secondary | ICD-10-CM

## 2013-11-11 DIAGNOSIS — S43421S Sprain of right rotator cuff capsule, sequela: Secondary | ICD-10-CM

## 2013-11-15 ENCOUNTER — Encounter: Payer: Self-pay | Admitting: Family Medicine

## 2013-11-15 ENCOUNTER — Ambulatory Visit (INDEPENDENT_AMBULATORY_CARE_PROVIDER_SITE_OTHER): Payer: No Typology Code available for payment source | Admitting: Family Medicine

## 2013-11-15 ENCOUNTER — Ambulatory Visit: Admitting: Family Medicine

## 2013-11-15 VITALS — BP 140/80 | Temp 98.2°F | Wt 167.0 lb

## 2013-11-15 DIAGNOSIS — H6593 Unspecified nonsuppurative otitis media, bilateral: Secondary | ICD-10-CM

## 2013-11-15 DIAGNOSIS — H6590 Unspecified nonsuppurative otitis media, unspecified ear: Secondary | ICD-10-CM | POA: Insufficient documentation

## 2013-11-15 MED ORDER — FLUTICASONE PROPIONATE 50 MCG/ACT NA SUSP: 2.0000 | Freq: Every day | NASAL | Status: DC

## 2013-11-15 NOTE — Patient Instructions (Signed)
I think you have sinus inflammation leading to fluid buildup in ears. Treat with nasal saline irrigation (neil med sinus rinse or similar) throughout the day, and start nasal steroid (flonase sent to pharmacy.) If nose bleeds, back off nasal steroid. Let us know if not improving with this treatment plan.

## 2013-11-15 NOTE — Progress Notes (Signed)
BP 140/80  Temp(Src) 98.2 F (36.8 C)  Wt 167 lb (75.751 kg)   CC: sinus pressure  Subjective:    Patient ID: Charles [name], male    DOB: [birth year], 63 y.o.   MRN: [id number]  HPI: Charles [name] is a 63 y.o. male presenting on 11/15/2013 for sinus issues   2 wk h/o mild pressure and significant muffled hearing R>L. No drainage from ears. No recent swimming. Some rhinorrhea.  No significant sinus congestion, fevers/chills, earache, PNdrainage, ST, tooth pain, coughing.  No h/o asthma or allergies. No smokers at home. Recently started on tramadol.  Has tried pseudophed  Relevant past medical, surgical, family and social history reviewed and updated as indicated.  Allergies and medications reviewed and updated. Current Outpatient Prescriptions on File Prior to Visit  Medication Sig  . amLODipine (NORVASC) 10 MG tablet TAKE 1 TABLET (10 MG TOTAL) BY MOUTH DAILY.  Marland Kitchen aspirin 325 MG EC tablet Take 325 mg by mouth daily.    . CRESTOR 20 MG tablet TAKE 1 TABLET (20 MG TOTAL) BY MOUTH DAILY.  Marland Kitchen CRESTOR 20 MG tablet TAKE 1 TABLET (20 MG TOTAL) BY MOUTH DAILY.  Marland Kitchen lisinopril (PRINIVIL,ZESTRIL) 20 MG tablet TAKE 1 TABLET (20 MG TOTAL) BY MOUTH DAILY.  . meloxicam (MOBIC) 15 MG tablet Take 1 tablet (15 mg total) by mouth daily.  . traMADol (ULTRAM) 50 MG tablet Take 1 tablet (50 mg total) by mouth every 6 (six) hours as needed.  Marland Kitchen ZETIA 10 MG tablet TAKE 1 TABLET (10 MG TOTAL) BY MOUTH DAILY.  Marland Kitchen VIAGRA 100 MG tablet TAKE 1 TABLET (100 MG TOTAL) BY MOUTH DAILY AS NEEDED.   No current facility-administered medications on file prior to visit.    Review of Systems Per HPI unless specifically indicated above    Objective:    BP 140/80  Temp(Src) 98.2 F (36.8 C)  Wt 167 lb (75.751 kg)  Physical Exam  Nursing note and vitals reviewed. Constitutional: He appears well-developed and well-nourished. No distress.  HENT:  Head: Normocephalic and atraumatic.  Right Ear: Hearing,  tympanic membrane, external ear and ear canal normal.  Left Ear: Hearing, tympanic membrane, external ear and ear canal normal.  Nose: Mucosal edema (erythematous) present. No rhinorrhea. Right sinus exhibits no maxillary sinus tenderness and no frontal sinus tenderness. Left sinus exhibits no maxillary sinus tenderness and no frontal sinus tenderness.  Mouth/Throat: Uvula is midline, oropharynx is clear and moist and mucous membranes are normal. No oropharyngeal exudate, posterior oropharyngeal edema, posterior oropharyngeal erythema or tonsillar abscesses.  TMs pearly grey but diminished light reflex, fluid behind TMs present bilaterally  Eyes: Conjunctivae and EOM are normal. Pupils are equal, round, and reactive to light. No scleral icterus.  Neck: Normal range of motion. Neck supple.  Cardiovascular: Normal rate, regular rhythm, normal heart sounds and intact distal pulses.   No murmur heard. Pulmonary/Chest: Effort normal and breath sounds normal. No respiratory distress. He has no wheezes. He has no rales.  Lymphadenopathy:    He has no cervical adenopathy.  Skin: Skin is warm and dry. No rash noted.       Assessment & Plan:   Problem List Items Addressed This Visit   Serous otitis media - Primary     Anticipate serous otitis from ETD. No evidence of infection today. Treat with nasal saline and INS. Update if not improving as expected. Pt agrees with plan.        Follow up plan: Return if  symptoms worsen or fail to improve.

## 2013-11-15 NOTE — Assessment & Plan Note (Signed)
Anticipate serous otitis from ETD. No evidence of infection today. Treat with nasal saline and INS. Update if not improving as expected. Pt agrees with plan.

## 2013-11-18 ENCOUNTER — Other Ambulatory Visit (INDEPENDENT_AMBULATORY_CARE_PROVIDER_SITE_OTHER): Payer: No Typology Code available for payment source

## 2013-11-18 DIAGNOSIS — Z0000 Encounter for general adult medical examination without abnormal findings: Secondary | ICD-10-CM

## 2013-11-18 DIAGNOSIS — R7309 Other abnormal glucose: Secondary | ICD-10-CM

## 2013-11-18 DIAGNOSIS — R7302 Impaired glucose tolerance (oral): Secondary | ICD-10-CM

## 2013-11-18 DIAGNOSIS — R748 Abnormal levels of other serum enzymes: Secondary | ICD-10-CM

## 2013-11-18 LAB — URINALYSIS, ROUTINE W REFLEX MICROSCOPIC
Bilirubin Urine: NEGATIVE
Leukocytes, UA: NEGATIVE
NITRITE: NEGATIVE
SPECIFIC GRAVITY, URINE: 1.015 (ref 1.000–1.030)
TOTAL PROTEIN, URINE-UPE24: NEGATIVE
Urine Glucose: NEGATIVE
Urobilinogen, UA: 0.2 (ref 0.0–1.0)
pH: 5.5 (ref 5.0–8.0)

## 2013-11-18 LAB — HEMOGLOBIN A1C: Hgb A1c MFr Bld: 4.3 % — ABNORMAL LOW (ref 4.6–6.5)

## 2013-11-18 LAB — CBC WITH DIFFERENTIAL/PLATELET
BASOS ABS: 0 10*3/uL (ref 0.0–0.1)
Basophils Relative: 0.4 % (ref 0.0–3.0)
Eosinophils Absolute: 0.1 10*3/uL (ref 0.0–0.7)
Eosinophils Relative: 1 % (ref 0.0–5.0)
HEMATOCRIT: 42.4 % (ref 39.0–52.0)
HEMOGLOBIN: 14.5 g/dL (ref 13.0–17.0)
LYMPHS ABS: 2.9 10*3/uL (ref 0.7–4.0)
Lymphocytes Relative: 28.1 % (ref 12.0–46.0)
MCHC: 34.3 g/dL (ref 30.0–36.0)
MCV: 100.9 fl — ABNORMAL HIGH (ref 78.0–100.0)
MONOS PCT: 6.5 % (ref 3.0–12.0)
Monocytes Absolute: 0.7 10*3/uL (ref 0.1–1.0)
Neutro Abs: 6.7 10*3/uL (ref 1.4–7.7)
Neutrophils Relative %: 64 % (ref 43.0–77.0)
PLATELETS: 182 10*3/uL (ref 150.0–400.0)
RBC: 4.2 Mil/uL — ABNORMAL LOW (ref 4.22–5.81)
RDW: 13.6 % (ref 11.5–15.5)
WBC: 10.5 10*3/uL (ref 4.0–10.5)

## 2013-11-18 LAB — BASIC METABOLIC PANEL
BUN: 19 mg/dL (ref 6–23)
CO2: 24 mEq/L (ref 19–32)
Calcium: 8.8 mg/dL (ref 8.4–10.5)
Chloride: 107 mEq/L (ref 96–112)
Creatinine, Ser: 1.2 mg/dL (ref 0.4–1.5)
GFR: 80.65 mL/min (ref >60.00)
GLUCOSE: 71 mg/dL (ref 70–99)
POTASSIUM: 4.5 meq/L (ref 3.5–5.1)
SODIUM: 140 meq/L (ref 135–145)

## 2013-11-18 LAB — HEPATIC FUNCTION PANEL
ALBUMIN: 3.5 g/dL (ref 3.5–5.2)
ALK PHOS: 47 U/L (ref 39–117)
ALT: 30 U/L (ref 0–53)
AST: 39 U/L — AB (ref 0–37)
Bilirubin, Direct: 0.2 mg/dL (ref 0.0–0.3)
Total Bilirubin: 0.9 mg/dL (ref 0.2–1.2)
Total Protein: 6.8 g/dL (ref 6.0–8.3)

## 2013-11-18 LAB — TSH: TSH: 0.99 u[IU]/mL (ref 0.35–4.50)

## 2013-11-18 LAB — LIPID PANEL
Cholesterol: 194 mg/dL (ref 0–200)
HDL: 60 mg/dL (ref >39.00)
NONHDL: 134
Total CHOL/HDL Ratio: 3
Triglycerides: 348 mg/dL — ABNORMAL HIGH (ref 0.0–149.0)
VLDL: 69.6 mg/dL — AB (ref 0.0–40.0)

## 2013-11-18 LAB — PSA: PSA: 2.22 ng/mL (ref 0.10–4.00)

## 2013-11-18 LAB — LDL CHOLESTEROL, DIRECT: Direct LDL: 80.8 mg/dL

## 2013-11-20 ENCOUNTER — Encounter: Payer: Self-pay | Admitting: Internal Medicine

## 2013-11-20 ENCOUNTER — Ambulatory Visit (INDEPENDENT_AMBULATORY_CARE_PROVIDER_SITE_OTHER): Payer: No Typology Code available for payment source | Admitting: Internal Medicine

## 2013-11-20 VITALS — BP 108/62 | HR 84 | Temp 98.0°F | Ht 76.0 in | Wt 165.0 lb

## 2013-11-20 DIAGNOSIS — R972 Elevated prostate specific antigen [PSA]: Secondary | ICD-10-CM

## 2013-11-20 DIAGNOSIS — I10 Essential (primary) hypertension: Secondary | ICD-10-CM

## 2013-11-20 DIAGNOSIS — H9191 Unspecified hearing loss, right ear: Secondary | ICD-10-CM

## 2013-11-20 DIAGNOSIS — H6983 Other specified disorders of Eustachian tube, bilateral: Secondary | ICD-10-CM

## 2013-11-20 DIAGNOSIS — H6980 Other specified disorders of Eustachian tube, unspecified ear: Secondary | ICD-10-CM | POA: Insufficient documentation

## 2013-11-20 DIAGNOSIS — Z0000 Encounter for general adult medical examination without abnormal findings: Secondary | ICD-10-CM

## 2013-11-20 HISTORY — PX: SHOULDER ARTHROSCOPY W/ ROTATOR CUFF REPAIR: SHX2400

## 2013-11-20 MED ORDER — CIPROFLOXACIN HCL 250 MG PO TABS: 250.0000 mg | ORAL_TABLET | Freq: Two times a day (BID) | ORAL | Status: DC

## 2013-11-20 NOTE — Assessment & Plan Note (Signed)
Ok for [employer] prn

## 2013-11-20 NOTE — Assessment & Plan Note (Signed)
Suspect some underlying prostatitis with his nocturia returning, psa had improved with antibx per urology 2 yrs ago, will try this again, with f/u psa as well

## 2013-11-20 NOTE — Progress Notes (Signed)
Pre visit review using our clinic review tool, if applicable. No additional management support is needed unless otherwise documented below in the visit note. 

## 2013-11-20 NOTE — Patient Instructions (Signed)
Please take all new medication as prescribed - the antibiotic for 4 weeks  Your right ear was irrigated today  You have elected to sign the pain contract today  You can also take Mucinex (or it's generic off brand) for congestion and ear symptoms, and tylenol as needed for pain.  Please continue all other medications as before, including the flonase  Please have the pharmacy call with any other refills you may need.  Please continue your efforts at being more active, low cholesterol diet, and weight control.  You are otherwise up to date with prevention measures today.  Please keep your appointments with your specialists as you may have planned - orthopedic for the right shoulder  Please return in 6 months, or sooner if needed, with Lab testing done 3-5 days before - just the PSA next time

## 2013-11-20 NOTE — Progress Notes (Signed)
Subjective:    Patient ID: Charles [name], male    DOB: 01-25-1951, 63 y.o.   MRN: [id number]  HPI Here for wellness and f/u;  Overall doing ok;  Pt denies CP, worsening SOB, DOE, wheezing, orthopnea, PND, worsening LE edema, palpitations, dizziness or syncope.  Pt denies neurological change such as new headache, facial or extremity weakness.  Pt denies polydipsia, polyuria, or low sugar symptoms. Pt states overall good compliance with treatment and medications, good tolerability, and has been trying to follow lower cholesterol diet.  Pt denies worsening depressive symptoms, suicidal ideation or panic. No fever, night sweats, wt loss, loss of appetite, or other constitutional symptoms.  Pt states good ability with ADL's, has low fall risk, home safety reviewed and adequate, no other significant changes in hearing or vision, and only occasionally active with exercise.      Declines flu shot today.  Has appt with ortho next wk for right shoulder rot cuff tear.  Still has some eustachian valve type right and left ear symptoms, despite the flonase for several days, and prn sudafed.  BP improved today, after recent right shoulder pain and elev BP. Denies urinary symptoms such as dysuria, frequency, urgency, flank pain, hematuria or n/v, fever, chills,. But does have nocuturia recent 4-5times per night. Past Medical History  Diagnosis Date  . ABNORMAL ELECTROCARDIOGRAM 11/02/2008  . ABSCESS, LUNG 10/23/2006  . BACTERIAL PNEUMONIA 12/28/2009  . C O P D 05/12/2009  . CHEST PAIN 12/24/2009  . Cramp of limb 07/19/2007  . DIVERTICULOSIS, COLON 03/07/2007  . 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/23/2006  . FLANK PAIN, LEFT 02/25/2010  . FREQUENCY, URINARY 12/24/2009  . GERD 10/23/2006  . HEMORRHOIDS 10/23/2006  . HYPERLIPIDEMIA 10/23/2006  . HYPERTENSION 10/23/2006  . PLANTAR FASCIITIS, LEFT 11/02/2008  . PSA, INCREASED 11/20/2007  . PULMONARY NODULE 05/14/2008  . Unspecified Peripheral Vascular Disease 10/23/2006  . BPH (benign  prostatic hyperplasia) 11/22/2010   Past Surgical History  Procedure Laterality Date  . Hernia repair    . Hemorrhoid surgery      reports that he has quit smoking. He does not have any smokeless tobacco history on file. He reports that he does not drink alcohol or use illicit drugs. family history includes Heart disease in his father and mother; Kidney disease in his sister. No Known Allergies Current Outpatient Prescriptions on File Prior to Visit  Medication Sig Dispense Refill  . amLODipine (NORVASC) 10 MG tablet TAKE 1 TABLET (10 MG TOTAL) BY MOUTH DAILY.  90 tablet  3  . aspirin 325 MG EC tablet Take 325 mg by mouth daily.        . CRESTOR 20 MG tablet TAKE 1 TABLET (20 MG TOTAL) BY MOUTH DAILY.  90 tablet  3  . CRESTOR 20 MG tablet TAKE 1 TABLET (20 MG TOTAL) BY MOUTH DAILY.  90 tablet  2  . fluticasone (FLONASE) 50 MCG/ACT nasal spray Place 2 sprays into both nostrils daily.  16 g  3  . lisinopril (PRINIVIL,ZESTRIL) 20 MG tablet TAKE 1 TABLET (20 MG TOTAL) BY MOUTH DAILY.  90 tablet  3  . meloxicam (MOBIC) 15 MG tablet Take 1 tablet (15 mg total) by mouth daily.  30 tablet  0  . traMADol (ULTRAM) 50 MG tablet Take 1 tablet (50 mg total) by mouth every 6 (six) hours as needed.  60 tablet  0  . VIAGRA 100 MG tablet TAKE 1 TABLET (100 MG TOTAL) BY MOUTH DAILY AS NEEDED.  10  tablet  5  . ZETIA 10 MG tablet TAKE 1 TABLET (10 MG TOTAL) BY MOUTH DAILY.  90 tablet  3   No current facility-administered medications on file prior to visit.    Review of Systems Constitutional: Negative for increased diaphoresis, other activity, appetite or other siginficant weight change  HENT: Negative for worsening hearing loss, ear pain, facial swelling, mouth sores and neck stiffness.   Eyes: Negative for other worsening pain, redness or visual disturbance.  Respiratory: Negative for shortness of breath and wheezing.   Cardiovascular: Negative for chest pain and palpitations.  Gastrointestinal:  Negative for diarrhea, blood in stool, abdominal distention or other pain Genitourinary: Negative for hematuria, flank pain or change in urine volume.  Musculoskeletal: Negative for myalgias or other joint complaints.  Skin: Negative for color change and wound.  Neurological: Negative for syncope and numbness. other than noted Hematological: Negative for adenopathy. or other swelling Psychiatric/Behavioral: Negative for hallucinations, self-injury, decreased concentration or other worsening agitation.      Objective:   Physical Exam BP 108/62  Pulse 84  Temp(Src) 98 F (36.7 C) (Oral)  Ht 6\' 4"  (1.93 m)  Wt 165 lb (74.844 kg)  BMI 20.09 kg/m2  SpO2 98% VS noted,  Constitutional: Pt is oriented to person, place, and time. Appears well-developed and well-nourished.  Head: Normocephalic and atraumatic.  Right Ear: External ear normal.  Left Ear: External ear normal.  Nose: Nose normal.  Mouth/Throat: Oropharynx is clear and moist.  Eyes: Conjunctivae and EOM are normal. Pupils are equal, round, and reactive to light.  Neck: Normal range of motion. Neck supple. No JVD present. No tracheal deviation present.  Cardiovascular: Normal rate, regular rhythm, normal heart sounds and intact distal pulses.   Pulmonary/Chest: Effort normal and breath sounds without rales or wheezing  Abdominal: Soft. Bowel sounds are normal. NT. No HSM  Musculoskeletal: Normal range of motion. Exhibits no edema.  Lymphadenopathy:  Has no cervical adenopathy.  Neurological: Pt is alert and oriented to person, place, and time. Pt has normal reflexes. No cranial nerve deficit. Motor grossly intact Skin: Skin is warm and dry. No rash noted.  Psychiatric:  Has normal mood and affect. Behavior is normal.     Assessment & Plan:   BP Readings from Last 3 Encounters:  11/20/13 108/62  11/15/13 140/80  10/30/13 150/80

## 2013-11-20 NOTE — Assessment & Plan Note (Addendum)

## 2013-11-20 NOTE — Assessment & Plan Note (Signed)
Improved with improved shoulder pain recently, to also see ortho as planned

## 2013-11-20 NOTE — Assessment & Plan Note (Signed)
Improved with right ear irrigation today

## 2013-12-09 ENCOUNTER — Telehealth: Payer: Self-pay | Admitting: Internal Medicine

## 2013-12-09 NOTE — Telephone Encounter (Signed)
Received 2 pages from [place], sent to Dr. Jenny Reichmann. 12/09/13/ss.

## 2013-12-12 ENCOUNTER — Other Ambulatory Visit: Payer: Self-pay | Admitting: Internal Medicine

## 2013-12-17 ENCOUNTER — Other Ambulatory Visit: Payer: Self-pay | Admitting: Internal Medicine

## 2013-12-23 ENCOUNTER — Telehealth: Payer: Self-pay | Admitting: Internal Medicine

## 2013-12-23 NOTE — Telephone Encounter (Signed)
Received 2 pages from [place], sent to Dr. Jenny Reichmann. 12/23/13/ss

## 2013-12-24 ENCOUNTER — Ambulatory Visit (INDEPENDENT_AMBULATORY_CARE_PROVIDER_SITE_OTHER): Payer: No Typology Code available for payment source | Admitting: Internal Medicine

## 2013-12-24 ENCOUNTER — Encounter: Payer: Self-pay | Admitting: Internal Medicine

## 2013-12-24 VITALS — BP 106/70 | HR 90 | Temp 98.6°F | Ht 76.0 in | Wt 159.0 lb

## 2013-12-24 DIAGNOSIS — J309 Allergic rhinitis, unspecified: Secondary | ICD-10-CM | POA: Insufficient documentation

## 2013-12-24 DIAGNOSIS — R0789 Other chest pain: Secondary | ICD-10-CM

## 2013-12-24 DIAGNOSIS — J3089 Other allergic rhinitis: Secondary | ICD-10-CM

## 2013-12-24 DIAGNOSIS — R05 Cough: Secondary | ICD-10-CM

## 2013-12-24 DIAGNOSIS — R059 Cough, unspecified: Secondary | ICD-10-CM | POA: Insufficient documentation

## 2013-12-24 MED ORDER — LEVOFLOXACIN 250 MG PO TABS: 250.0000 mg | ORAL_TABLET | Freq: Every day | ORAL | Status: DC

## 2013-12-24 MED ORDER — METHYLPREDNISOLONE ACETATE 80 MG/ML IJ SUSP
80.0000 mg | Freq: Once | INTRAMUSCULAR | Status: AC
  Administered 2013-12-24: 80 mg via INTRAMUSCULAR

## 2013-12-24 NOTE — Progress Notes (Signed)
Subjective:    Patient ID: Charles [name], male    DOB: [birth year], 63 y.o.   MRN: [id number]  HPI  Here with acute onset mild to mod Cough x 1 wk, scant prod, ? colored, no fever., nothing seems to make better or worse., but is assoc with pleuritc left far lateral CP under the arm as well  Does have several wks ongoing nasal allergy symptoms with clearish congestion, itch and sneezing, without fever, pain,swelling or wheezing. Past Medical History  Diagnosis Date  . ABNORMAL ELECTROCARDIOGRAM 11/02/2008  . ABSCESS, LUNG 10/23/2006  . BACTERIAL PNEUMONIA 12/28/2009  . C O P D 05/12/2009  . CHEST PAIN 12/24/2009  . Cramp of limb 07/19/2007  . DIVERTICULOSIS, COLON 03/07/2007  . 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/23/2006  . FLANK PAIN, LEFT 02/25/2010  . FREQUENCY, URINARY 12/24/2009  . GERD 10/23/2006  . HEMORRHOIDS 10/23/2006  . HYPERLIPIDEMIA 10/23/2006  . HYPERTENSION 10/23/2006  . PLANTAR FASCIITIS, LEFT 11/02/2008  . PSA, INCREASED 11/20/2007  . PULMONARY NODULE 05/14/2008  . Unspecified Peripheral Vascular Disease 10/23/2006  . BPH (benign prostatic hyperplasia) 11/22/2010   Past Surgical History  Procedure Laterality Date  . Hernia repair    . Hemorrhoid surgery      reports that he has quit smoking. He does not have any smokeless tobacco history on file. He reports that he does not drink alcohol or use illicit drugs. family history includes Heart disease in his father and mother; Kidney disease in his sister. No Known Allergies Current Outpatient Prescriptions on File Prior to Visit  Medication Sig Dispense Refill  . amLODipine (NORVASC) 10 MG tablet TAKE 1 TABLET (10 MG TOTAL) BY MOUTH DAILY. 90 tablet 3  . aspirin 325 MG EC tablet Take 325 mg by mouth daily.      . CRESTOR 20 MG tablet TAKE 1 TABLET (20 MG TOTAL) BY MOUTH DAILY. 90 tablet 3  . CRESTOR 20 MG tablet TAKE 1 TABLET (20 MG TOTAL) BY MOUTH DAILY. 90 tablet 2  . fluticasone (FLONASE) 50 MCG/ACT nasal spray Place 2 sprays into both nostrils  daily. 16 g 3  . lisinopril (PRINIVIL,ZESTRIL) 20 MG tablet TAKE 1 TABLET (20 MG TOTAL) BY MOUTH DAILY. 90 tablet 3  . meloxicam (MOBIC) 15 MG tablet Take 1 tablet (15 mg total) by mouth daily. 30 tablet 0  . traMADol (ULTRAM) 50 MG tablet Take 1 tablet (50 mg total) by mouth every 6 (six) hours as needed. 60 tablet 0  . VIAGRA 100 MG tablet TAKE 1 TABLET (100 MG TOTAL) BY MOUTH DAILY AS NEEDED. 10 tablet 5  . ZETIA 10 MG tablet TAKE 1 TABLET (10 MG TOTAL) BY MOUTH DAILY. 90 tablet 3   No current facility-administered medications on file prior to visit.   Review of Systems  Constitutional: Negative for unusual diaphoresis or other sweats  HENT: Negative for ringing in ear Eyes: Negative for double vision or worsening visual disturbance.  Respiratory: Negative for choking and stridor.   Gastrointestinal: Negative for vomiting or other signifcant bowel change Genitourinary: Negative for hematuria or decreased urine volume.  Musculoskeletal: Negative for other MSK pain or swelling Skin: Negative for color change and worsening wound.  Neurological: Negative for tremors and numbness other than noted  Psychiatric/Behavioral: Negative for decreased concentration or agitation other than above       Objective:   Physical Exam BP 106/70 mmHg  Pulse 90  Temp(Src) 98.6 F (37 C) (Oral)  Ht 6\' 4"  (1.93 m)  Wt  159 lb (72.122 kg)  BMI 19.36 kg/m2  SpO2 96% VS noted, mild ill Constitutional: Pt appears well-developed, well-nourished.  HENT: Head: NCAT.  Right Ear: External ear normal.  Left Ear: External ear normal.  Eyes: . Pupils are equal, round, and reactive to light. Conjunctivae and EOM are normal Bilat tm's with mild erythema.  Max sinus areas non tender.  Pharynx with mild erythema, no exudate Neck: Normal range of motion. Neck supple.  Cardiovascular: Normal rate and regular rhythm.   Pulmonary/Chest: Effort normal and breath sounds mild decreased.  + tender left lateral chest wall  mid axillary line about T5 Neurological: Pt is alert. Not confused , motor grossly intact Skin: Skin is warm. No rash Psychiatric: Pt behavior is normal. No agitation.     Assessment & Plan:

## 2013-12-24 NOTE — Progress Notes (Signed)
Pre visit review using our clinic review tool, if applicable. No additional management support is needed unless otherwise documented below in the visit note. 

## 2013-12-24 NOTE — Patient Instructions (Signed)
You had the steroid shot today  Please take all new medication as prescribed - the antibiotic  Please go to the XRAY Department in the Basement (go straight as you get off the elevator) for the x-ray testing at your convenience   Please continue all other medications as before, and refills have been done if requested.  Please have the pharmacy call with any other refills you may need.  Please keep your appointments with your specialists as you may have planned

## 2013-12-25 ENCOUNTER — Telehealth: Payer: Self-pay | Admitting: Internal Medicine

## 2013-12-25 ENCOUNTER — Ambulatory Visit (INDEPENDENT_AMBULATORY_CARE_PROVIDER_SITE_OTHER)
Admission: RE | Admit: 2013-12-25 | Discharge: 2013-12-25 | Disposition: A | Discharge status: Home / Self Care | Payer: No Typology Code available for payment source | Source: Ambulatory Visit | Attending: Internal Medicine | Admitting: Internal Medicine

## 2013-12-25 DIAGNOSIS — R0789 Other chest pain: Secondary | ICD-10-CM

## 2013-12-25 DIAGNOSIS — R05 Cough: Secondary | ICD-10-CM

## 2013-12-25 DIAGNOSIS — J189 Pneumonia, unspecified organism: Secondary | ICD-10-CM

## 2013-12-25 DIAGNOSIS — R059 Cough, unspecified: Secondary | ICD-10-CM

## 2013-12-25 NOTE — Telephone Encounter (Signed)
To robin - ok to call pt, but I have actually already tx him with levaquin as of yesterday, so just make sure he will be taking it  I have ordered cxr

## 2013-12-25 NOTE — Telephone Encounter (Signed)
Patient informed. 

## 2013-12-28 NOTE — Assessment & Plan Note (Signed)
I suspect MSK pain, doubt cardiac, for cxr - r/o pna

## 2013-12-28 NOTE — Assessment & Plan Note (Signed)
/  Mild to mod, for predpac asd,  to f/u any worsening symptoms or concerns 

## 2013-12-28 NOTE — Assessment & Plan Note (Signed)
C/w bronchitis, Mild to mod, for antibx course,  to f/u any worsening symptoms or concerns

## 2014-01-01 ENCOUNTER — Ambulatory Visit (INDEPENDENT_AMBULATORY_CARE_PROVIDER_SITE_OTHER)
Admission: RE | Admit: 2014-01-01 | Discharge: 2014-01-01 | Disposition: A | Discharge status: Home / Self Care | Payer: No Typology Code available for payment source | Source: Ambulatory Visit | Attending: Internal Medicine | Admitting: Internal Medicine

## 2014-01-01 DIAGNOSIS — J189 Pneumonia, unspecified organism: Secondary | ICD-10-CM

## 2014-01-02 ENCOUNTER — Telehealth: Payer: Self-pay | Admitting: Internal Medicine

## 2014-01-02 NOTE — Telephone Encounter (Signed)
OK to hold of on further antibx if not having fever, or worsening pain, cough, sob; will see at next OV

## 2014-01-02 NOTE — Telephone Encounter (Signed)
-----   Message from [place] sent at 01/02/2014 11:11 AM EST ----- The patient has one more antibiotic to take and will be done, he would like PCP to send in a refill if possible.

## 2014-01-02 NOTE — Telephone Encounter (Signed)
Patient informed. 

## 2014-01-11 ENCOUNTER — Other Ambulatory Visit: Payer: Self-pay | Admitting: Internal Medicine

## 2014-01-14 ENCOUNTER — Ambulatory Visit: Payer: No Typology Code available for payment source | Admitting: Internal Medicine

## 2014-01-20 DIAGNOSIS — I82409 Acute embolism and thrombosis of unspecified deep veins of unspecified lower extremity: Secondary | ICD-10-CM

## 2014-01-20 HISTORY — DX: Acute embolism and thrombosis of unspecified deep veins of unspecified lower extremity: I82.409

## 2014-01-26 ENCOUNTER — Other Ambulatory Visit: Payer: Self-pay | Admitting: Internal Medicine

## 2014-01-28 ENCOUNTER — Ambulatory Visit (INDEPENDENT_AMBULATORY_CARE_PROVIDER_SITE_OTHER): Payer: No Typology Code available for payment source | Admitting: Internal Medicine

## 2014-01-28 ENCOUNTER — Encounter: Payer: Self-pay | Admitting: Internal Medicine

## 2014-01-28 VITALS — BP 140/92 | HR 107 | Temp 97.9°F | Ht 76.0 in | Wt 163.0 lb

## 2014-01-28 DIAGNOSIS — R05 Cough: Secondary | ICD-10-CM

## 2014-01-28 DIAGNOSIS — R059 Cough, unspecified: Secondary | ICD-10-CM

## 2014-01-28 DIAGNOSIS — Z7289 Other problems related to lifestyle: Secondary | ICD-10-CM

## 2014-01-28 DIAGNOSIS — F1090 Alcohol use, unspecified, uncomplicated: Secondary | ICD-10-CM

## 2014-01-28 DIAGNOSIS — F1010 Alcohol abuse, uncomplicated: Secondary | ICD-10-CM

## 2014-01-28 DIAGNOSIS — J449 Chronic obstructive pulmonary disease, unspecified: Secondary | ICD-10-CM

## 2014-01-28 DIAGNOSIS — I10 Essential (primary) hypertension: Secondary | ICD-10-CM

## 2014-01-28 MED ORDER — AMOXICILLIN-POT CLAVULANATE 875-125 MG PO TABS: 1.0000 | ORAL_TABLET | Freq: Two times a day (BID) | ORAL | Status: DC

## 2014-01-28 NOTE — Patient Instructions (Signed)
Please take all new medication as prescribed - the antibiotic  Please continue all other medications as before, and refills have been done if requested.  Please have the pharmacy call with any other refills you may need.  Please keep your appointments with your specialists as you may have planned  Please go to the XRAY Department in the Basement (go straight as you get off the elevator) for the x-ray testing tomorrow  You will be contacted by phone if any changes need to be made immediately.  Otherwise, you will receive a letter about your results with an explanation, but please check with MyChart first.

## 2014-01-28 NOTE — Progress Notes (Signed)
Subjective:    Patient ID: Charles [name], male    DOB: Sep 28, 1950, 63 y.o.   MRN: [id number]  HPI  Here to f/u, was improved, but never stopped his cough, which is now worsening again , slightly prod ? Greenish, with mild sob, no DOE and Pt denies chest pain, wheezing, orthopnea, PND, increased LE swelling, palpitations, dizziness or syncope. Has hx of recent Left upper lobe pna. No hemoptysis, no high fever, chills. Drinks ETOH occas still per pt Past Medical History  Diagnosis Date  . ABNORMAL ELECTROCARDIOGRAM 11/02/2008  . ABSCESS, LUNG 10/23/2006  . BACTERIAL PNEUMONIA 12/28/2009  . C O P D 05/12/2009  . CHEST PAIN 12/24/2009  . Cramp of limb 07/19/2007  . DIVERTICULOSIS, COLON 03/07/2007  . 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/23/2006  . FLANK PAIN, LEFT 02/25/2010  . FREQUENCY, URINARY 12/24/2009  . GERD 10/23/2006  . HEMORRHOIDS 10/23/2006  . HYPERLIPIDEMIA 10/23/2006  . HYPERTENSION 10/23/2006  . PLANTAR FASCIITIS, LEFT 11/02/2008  . PSA, INCREASED 11/20/2007  . PULMONARY NODULE 05/14/2008  . Unspecified Peripheral Vascular Disease 10/23/2006  . BPH (benign prostatic hyperplasia) 11/22/2010   Past Surgical History  Procedure Laterality Date  . Hernia repair    . Hemorrhoid surgery      reports that he has quit smoking. He does not have any smokeless tobacco history on file. He reports that he does not drink alcohol or use illicit drugs. family history includes Heart disease in his father and mother; Kidney disease in his sister. No Known Allergies Current Outpatient Prescriptions on File Prior to Visit  Medication Sig Dispense Refill  . amLODipine (NORVASC) 10 MG tablet TAKE 1 TABLET (10 MG TOTAL) BY MOUTH DAILY. 90 tablet 3  . aspirin 325 MG EC tablet Take 325 mg by mouth daily.      . CRESTOR 20 MG tablet TAKE 1 TABLET (20 MG TOTAL) BY MOUTH DAILY. 90 tablet 3  . fluticasone (FLONASE) 50 MCG/ACT nasal spray Place 2 sprays into both nostrils daily. 16 g 3  . lisinopril (PRINIVIL,ZESTRIL) 20 MG tablet  TAKE 1 TABLET (20 MG TOTAL) BY MOUTH DAILY. 90 tablet 3  . meloxicam (MOBIC) 15 MG tablet Take 1 tablet (15 mg total) by mouth daily. 30 tablet 0  . traMADol (ULTRAM) 50 MG tablet Take 1 tablet (50 mg total) by mouth every 6 (six) hours as needed. 60 tablet 0  . VIAGRA 100 MG tablet TAKE 1 TABLET (100 MG TOTAL) BY MOUTH DAILY AS NEEDED. 10 tablet 3  . ZETIA 10 MG tablet TAKE 1 TABLET (10 MG TOTAL) BY MOUTH DAILY. 90 tablet 3   No current facility-administered medications on file prior to visit.   Review of Systems  Constitutional: Negative for unusual diaphoresis or other sweats  HENT: Negative for ringing in ear Eyes: Negative for double vision or worsening visual disturbance.  Respiratory: Negative for choking and stridor.   Gastrointestinal: Negative for vomiting or other signifcant bowel change Genitourinary: Negative for hematuria or decreased urine volume.  Musculoskeletal: Negative for other MSK pain or swelling Skin: Negative for color change and worsening wound.  Neurological: Negative for tremors and numbness other than noted  Psychiatric/Behavioral: Negative for decreased concentration or agitation other than above       Objective:   Physical Exam BP 140/92 mmHg  Pulse 107  Temp(Src) 97.9 F (36.6 C) (Oral)  Ht 6\' 4"  (1.93 m)  Wt 163 lb (73.936 kg)  BMI 19.85 kg/m2  SpO2 98% VS noted, mild ill Constitutional:  Pt appears well-developed, well-nourished.  HENT: Head: NCAT.  Right Ear: External ear normal.  Left Ear: External ear normal.  Eyes: . Pupils are equal, round, and reactive to light. Conjunctivae and EOM are normal Neck: Normal range of motion. Neck supple.  Cardiovascular: Normal rate and regular rhythm.   Pulmonary/Chest: Effort normal and breath sounds decreased bilat  Neurological: Pt is alert. Not confused , motor grossly intact Skin: Skin is warm. No rash Psychiatric: Pt behavior is normal. No agitation.     Assessment & Plan:

## 2014-01-28 NOTE — Progress Notes (Signed)
Pre visit review using our clinic review tool, if applicable. No additional management support is needed unless otherwise documented below in the visit note. 

## 2014-01-29 ENCOUNTER — Ambulatory Visit (INDEPENDENT_AMBULATORY_CARE_PROVIDER_SITE_OTHER)
Admission: RE | Admit: 2014-01-29 | Discharge: 2014-01-29 | Disposition: A | Discharge status: Home / Self Care | Payer: No Typology Code available for payment source | Source: Ambulatory Visit | Attending: Internal Medicine | Admitting: Internal Medicine

## 2014-01-29 ENCOUNTER — Other Ambulatory Visit: Payer: Self-pay | Admitting: Internal Medicine

## 2014-01-29 DIAGNOSIS — R9389 Abnormal findings on diagnostic imaging of other specified body structures: Secondary | ICD-10-CM

## 2014-01-29 DIAGNOSIS — R911 Solitary pulmonary nodule: Secondary | ICD-10-CM

## 2014-01-29 DIAGNOSIS — R05 Cough: Secondary | ICD-10-CM

## 2014-01-29 DIAGNOSIS — J189 Pneumonia, unspecified organism: Secondary | ICD-10-CM

## 2014-02-01 NOTE — Assessment & Plan Note (Signed)
Urged to abstain 

## 2014-02-01 NOTE — Assessment & Plan Note (Signed)
stable overall by history and exam, recent data reviewed with pt, and pt to continue medical treatment as before,  to f/u any worsening symptoms or concerns SpO2 Readings from Last 3 Encounters:  01/28/14 98%  12/24/13 96%  11/20/13 98%

## 2014-02-01 NOTE — Assessment & Plan Note (Signed)
Suspect recurrent pna - aspirate, for cxr, empiric antibx, urged to quit ETOH completely, consider CT chest if recurred so quickly r/o underlying mass or other

## 2014-02-01 NOTE — Assessment & Plan Note (Signed)
stable overall by history and exam, recent data reviewed with pt, and pt to continue medical treatment as before,  to f/u any worsening symptoms or concerns BP Readings from Last 3 Encounters:  01/28/14 140/92  12/24/13 106/70  11/20/13 108/62

## 2014-02-11 ENCOUNTER — Inpatient Hospital Stay: Admission: RE | Admit: 2014-02-11 | Payer: No Typology Code available for payment source | Source: Ambulatory Visit

## 2014-02-16 ENCOUNTER — Other Ambulatory Visit (INDEPENDENT_AMBULATORY_CARE_PROVIDER_SITE_OTHER): Payer: No Typology Code available for payment source

## 2014-02-16 DIAGNOSIS — R938 Abnormal findings on diagnostic imaging of other specified body structures: Secondary | ICD-10-CM

## 2014-02-16 DIAGNOSIS — R9389 Abnormal findings on diagnostic imaging of other specified body structures: Secondary | ICD-10-CM

## 2014-02-16 LAB — BASIC METABOLIC PANEL
BUN: 19 mg/dL (ref 6–23)
CALCIUM: 8.3 mg/dL — AB (ref 8.4–10.5)
CO2: 25 meq/L (ref 19–32)
CREATININE: 1 mg/dL (ref 0.4–1.5)
Chloride: 107 mEq/L (ref 96–112)
GFR: 98.68 mL/min (ref >60.00)
GLUCOSE: 124 mg/dL — AB (ref 70–99)
Potassium: 4.5 mEq/L (ref 3.5–5.1)
Sodium: 141 mEq/L (ref 135–145)

## 2014-02-18 ENCOUNTER — Ambulatory Visit (INDEPENDENT_AMBULATORY_CARE_PROVIDER_SITE_OTHER): Payer: No Typology Code available for payment source | Admitting: Internal Medicine

## 2014-02-18 ENCOUNTER — Telehealth: Payer: Self-pay

## 2014-02-18 ENCOUNTER — Ambulatory Visit (INDEPENDENT_AMBULATORY_CARE_PROVIDER_SITE_OTHER)
Admission: RE | Admit: 2014-02-18 | Discharge: 2014-02-18 | Disposition: A | Discharge status: Home / Self Care | Payer: No Typology Code available for payment source | Source: Ambulatory Visit | Attending: Internal Medicine | Admitting: Internal Medicine

## 2014-02-18 ENCOUNTER — Encounter: Payer: Self-pay | Admitting: Internal Medicine

## 2014-02-18 VITALS — BP 132/82 | HR 116 | Temp 98.0°F | Ht 76.0 in | Wt 160.5 lb

## 2014-02-18 DIAGNOSIS — R938 Abnormal findings on diagnostic imaging of other specified body structures: Secondary | ICD-10-CM

## 2014-02-18 DIAGNOSIS — R918 Other nonspecific abnormal finding of lung field: Secondary | ICD-10-CM | POA: Insufficient documentation

## 2014-02-18 DIAGNOSIS — I2699 Other pulmonary embolism without acute cor pulmonale: Secondary | ICD-10-CM | POA: Insufficient documentation

## 2014-02-18 DIAGNOSIS — R9389 Abnormal findings on diagnostic imaging of other specified body structures: Secondary | ICD-10-CM

## 2014-02-18 DIAGNOSIS — J449 Chronic obstructive pulmonary disease, unspecified: Secondary | ICD-10-CM

## 2014-02-18 MED ORDER — RIVAROXABAN 20 MG PO TABS: 20.0000 mg | ORAL_TABLET | Freq: Every day | ORAL | Status: DC

## 2014-02-18 MED ORDER — IOHEXOL 300 MG/ML  SOLN
80.0000 mL | Freq: Once | INTRAMUSCULAR | Status: AC | PRN
  Administered 2014-02-18: 80 mL via INTRAVENOUS

## 2014-02-18 MED ORDER — RIVAROXABAN (XARELTO) VTE STARTER PACK (15 & 20 MG): ORAL_TABLET | ORAL | Status: DC

## 2014-02-18 NOTE — Patient Instructions (Signed)
Please take all new medication as prescribed - the xarelto  Please continue all other medications as before  Please have the pharmacy call with any other refills you may need.  Please keep your appointments with your specialists as you may have planned, except I will need to contact Dr Lake Bells to see if can see you sooner than Jan 11  You will be contacted regarding the referral for: Legs (both) venous dopplers (to see [facility] now)

## 2014-02-18 NOTE — Progress Notes (Signed)
Pre visit review using our clinic review tool, if applicable. No additional management support is needed unless otherwise documented below in the visit note. 

## 2014-02-18 NOTE — Telephone Encounter (Signed)
Please review todays CT Chest results in the absence of Dr. Jenny Reichmann. Thank you.

## 2014-02-18 NOTE — Progress Notes (Signed)
Subjective:    Patient ID: Charles [name], male    DOB: 08-Jan-1951, 63 y.o.   MRN: [id number]  HPI  Here to f/u with wife, Pt denies chest pain, increased sob or doe, wheezing, orthopnea, PND, increased LE swelling, palpitations, dizziness or syncope, has overall improved with recent repeat course antibx.  Recent CT abnormal, with 3 spiculated nodules left lung, also incidental acute PE noted on right; now on xarelto since yesterday, started with samples here, needs rx. Has appt jan 11 with pulm/dr mcquaid Past Medical History  Diagnosis Date  . ABNORMAL ELECTROCARDIOGRAM 11/02/2008  . ABSCESS, LUNG 10/23/2006  . BACTERIAL PNEUMONIA 12/28/2009  . C O P D 05/12/2009  . CHEST PAIN 12/24/2009  . Cramp of limb 07/19/2007  . DIVERTICULOSIS, COLON 03/07/2007  . 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/23/2006  . FLANK PAIN, LEFT 02/25/2010  . FREQUENCY, URINARY 12/24/2009  . GERD 10/23/2006  . HEMORRHOIDS 10/23/2006  . HYPERLIPIDEMIA 10/23/2006  . HYPERTENSION 10/23/2006  . PLANTAR FASCIITIS, LEFT 11/02/2008  . PSA, INCREASED 11/20/2007  . PULMONARY NODULE 05/14/2008  . Unspecified Peripheral Vascular Disease 10/23/2006  . BPH (benign prostatic hyperplasia) 11/22/2010   Past Surgical History  Procedure Laterality Date  . Hernia repair    . Hemorrhoid surgery      reports that he has quit smoking. His smoking use included Cigarettes. He has a 15 pack-year smoking history. He has never used smokeless tobacco. He reports that he uses illicit drugs (Marijuana). He reports that he does not drink alcohol. family history includes Heart disease in his father and mother; Kidney disease in his sister. No Known Allergies Current Outpatient Prescriptions on File Prior to Visit  Medication Sig Dispense Refill  . amLODipine (NORVASC) 10 MG tablet TAKE 1 TABLET (10 MG TOTAL) BY MOUTH DAILY. 90 tablet 3  . aspirin 325 MG EC tablet Take 325 mg by mouth daily.      . CRESTOR 20 MG tablet TAKE 1 TABLET (20 MG TOTAL) BY MOUTH DAILY. 90 tablet 3   . fluticasone (FLONASE) 50 MCG/ACT nasal spray Place 2 sprays into both nostrils daily. (Patient not taking: Reported on 02/23/2014) 16 g 3  . lisinopril (PRINIVIL,ZESTRIL) 20 MG tablet TAKE 1 TABLET (20 MG TOTAL) BY MOUTH DAILY. 90 tablet 3  . meloxicam (MOBIC) 15 MG tablet Take 1 tablet (15 mg total) by mouth daily. (Patient not taking: Reported on 02/23/2014) 30 tablet 0  . traMADol (ULTRAM) 50 MG tablet Take 1 tablet (50 mg total) by mouth every 6 (six) hours as needed. (Patient not taking: Reported on 02/23/2014) 60 tablet 0  . VIAGRA 100 MG tablet TAKE 1 TABLET (100 MG TOTAL) BY MOUTH DAILY AS NEEDED. 10 tablet 3  . ZETIA 10 MG tablet TAKE 1 TABLET (10 MG TOTAL) BY MOUTH DAILY. 90 tablet 3   No current facility-administered medications on file prior to visit.    Review of Systems  Constitutional: Negative for unusual diaphoresis or other sweats  HENT: Negative for ringing in ear Eyes: Negative for double vision or worsening visual disturbance.  Respiratory: Negative for choking and stridor.   Gastrointestinal: Negative for vomiting or other signifcant bowel change Genitourinary: Negative for hematuria or decreased urine volume.  Musculoskeletal: Negative for other MSK pain or swelling Skin: Negative for color change and worsening wound.  Neurological: Negative for tremors and numbness other than noted  Psychiatric/Behavioral: Negative for decreased concentration or agitation other than above       Objective:   Physical Exam  BP 132/82 mmHg  Pulse 116  Temp(Src) 98 F (36.7 C) (Oral)  Ht 6\' 4"  (1.93 m)  Wt 160 lb 8 oz (72.802 kg)  BMI 19.54 kg/m2  SpO2 98% VS noted,  Constitutional: Pt appears well-developed, well-nourished.  HENT: Head: NCAT.  Right Ear: External ear normal.  Left Ear: External ear normal.  Eyes: . Pupils are equal, round, and reactive to light. Conjunctivae and EOM are normal Neck: Normal range of motion. Neck supple.  Cardiovascular: Normal rate and regular  rhythm.   Pulmonary/Chest: Effort normal and breath sounds without rales or wheezing.  Abd:  Soft, NT, ND, + BS Neurological: Pt is alert. Not confused , motor grossly intact Skin: Skin is warm. No rash Psychiatric: Pt behavior is normal. No agitation.     Assessment & Plan:

## 2014-02-18 NOTE — Telephone Encounter (Signed)
Called the patient informed to come to office asap to pickup Xarelto and start immediately 1 pill twice a day and instructions to stop Meloxicam.  Patient agreed to come by the office asap to pickup xarelto and patient has been scheduled at 1 today 02/18/14 with Dr. Jenny Reichmann.  Will forward note to PCP Dr. Jenny Reichmann.

## 2014-02-18 NOTE — Telephone Encounter (Signed)
   Pick up samples of Xarelto 15 mg to be started immediately 1 pill twice a day. Stop the meloxicam. See Dr Jenny Reichmann today or tomorrow. Lot [id number]; expiration date 06/17.

## 2014-02-19 ENCOUNTER — Ambulatory Visit (HOSPITAL_COMMUNITY): Payer: No Typology Code available for payment source | Attending: Cardiovascular Disease | Admitting: *Deleted

## 2014-02-19 ENCOUNTER — Telehealth: Payer: Self-pay

## 2014-02-19 DIAGNOSIS — I2699 Other pulmonary embolism without acute cor pulmonale: Secondary | ICD-10-CM

## 2014-02-19 DIAGNOSIS — I82402 Acute embolism and thrombosis of unspecified deep veins of left lower extremity: Secondary | ICD-10-CM | POA: Diagnosis present

## 2014-02-19 NOTE — Progress Notes (Signed)
Lower Extremity Venous Duplex Exam complete, positive for DVT from the left CFV to the popliteal vein. Dr. Jenny Reichmann notified.

## 2014-02-19 NOTE — Telephone Encounter (Signed)
Spoke with pt to move up appt from 03/02/14 with Dr. Lake Bells at Dr. Annamaria Boots and Dr. Gwynn Burly request.  Pt has been rescheduled to 02/23/2014 @2 :15 with Dr. Lamonte Sakai.  Pt aware to arrive 15 minutes early for new patient paperwork.  Nothing further needed.  Forwarding simply fyi.

## 2014-02-20 DIAGNOSIS — I2699 Other pulmonary embolism without acute cor pulmonale: Secondary | ICD-10-CM

## 2014-02-20 HISTORY — DX: Other pulmonary embolism without acute cor pulmonale: I26.99

## 2014-02-23 ENCOUNTER — Ambulatory Visit (INDEPENDENT_AMBULATORY_CARE_PROVIDER_SITE_OTHER): Payer: No Typology Code available for payment source | Admitting: Emergency Medicine

## 2014-02-23 ENCOUNTER — Encounter: Payer: Self-pay | Admitting: *Deleted

## 2014-02-23 ENCOUNTER — Encounter: Payer: Self-pay | Admitting: Emergency Medicine

## 2014-02-23 VITALS — BP 114/78 | HR 93 | Temp 98.2°F | Ht 76.0 in | Wt 161.0 lb

## 2014-02-23 DIAGNOSIS — R911 Solitary pulmonary nodule: Secondary | ICD-10-CM

## 2014-02-23 DIAGNOSIS — I2699 Other pulmonary embolism without acute cor pulmonale: Secondary | ICD-10-CM

## 2014-02-23 DIAGNOSIS — R918 Other nonspecific abnormal finding of lung field: Secondary | ICD-10-CM | POA: Diagnosis not present

## 2014-02-23 NOTE — Patient Instructions (Addendum)
Please continue your xarelto as you have been taking it  We will arrange for you to be admitted to the hospital in approximately 2 weeks so that you can be covered with medication to protect from another blood clot and safely undergo biopsy of your lung nodule.  We will contact you with the scheduling details about the procedure.  Follow with Dr Lamonte Sakai in 1 month

## 2014-02-23 NOTE — Assessment & Plan Note (Signed)
Newly identified left lower lobe spiculated nodule with 2 surrounding nodules. These were not seen on his previous CT scan from 2012. His old right upper lobe nodule has been stable and is now less well appreciated. Given the entire clinical picture with recent pulmonary embolism this is very concerning for malignancy. Discussed with him the options for biopsy including a needle biopsy versus bronchoscopy. I believe that a navigational bronchoscopy would be highest yield and would avoid the peripheral surrounding emphysema that puts him at higher risk for pneumothorax. His anticoagulation will need to be bridged - the other option would be to wait for 3 months before doing his procedure when his anticoagulation could be stopped for safely.. We both agree that we would like to proceed with a possible tissue diagnosis sooner than that. I will admit him to the hospital for heparin bridging after he's been on Xarelto for approximately 3 weeks

## 2014-02-23 NOTE — Assessment & Plan Note (Signed)
Continue Xarelto for another 2 weeks and then I will admit him for heparin bridging to facilitate his procedure

## 2014-02-23 NOTE — Progress Notes (Signed)
Subjective:    Patient ID: Charles [name], male    DOB: 07-06-1950, 64 y.o.   MRN: [id number]  HPI 64 year old former cigarette smoker, THC smoker, PVD, suspected COPD. He began to have some L CP,  Minimal cough in October. CXR 11/5, 12/10 showed right upper lobe infiltrate. He was treated with antibiotics with some resolution of his symptoms. A CT chest 12/30 showed R sided PE and a L LL spiculated nodule with 2 other smaller associated nodules concerning for malignancy. LE dopplers show LLE femoral DVT. He is referred for evaluation of his abnormal CT scan.    Review of Systems  Constitutional: Positive for appetite change and unexpected weight change. Negative for fever.  HENT: Positive for congestion and ear pain. Negative for dental problem, nosebleeds, postnasal drip, rhinorrhea, sinus pressure, sneezing, sore throat and trouble swallowing.   Eyes: Negative for redness and itching.  Respiratory: Positive for cough and shortness of breath. Negative for chest tightness and wheezing.   Cardiovascular: Negative for palpitations and leg swelling.  Gastrointestinal: Negative for nausea and vomiting.  Genitourinary: Negative for dysuria.  Musculoskeletal: Negative for joint swelling.  Skin: Negative for rash.  Neurological: Negative for headaches.  Hematological: Does not bruise/bleed easily.  Psychiatric/Behavioral: Negative for dysphoric mood. The patient is not nervous/anxious.     Past Medical History  Diagnosis Date  . ABNORMAL ELECTROCARDIOGRAM 11/02/2008  . ABSCESS, LUNG 10/23/2006  . BACTERIAL PNEUMONIA 12/28/2009  . C O P D 05/12/2009  . CHEST PAIN 12/24/2009  . Cramp of limb 07/19/2007  . DIVERTICULOSIS, COLON 03/07/2007  . 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/23/2006  . FLANK PAIN, LEFT 02/25/2010  . FREQUENCY, URINARY 12/24/2009  . GERD 10/23/2006  . HEMORRHOIDS 10/23/2006  . HYPERLIPIDEMIA 10/23/2006  . HYPERTENSION 10/23/2006  . PLANTAR FASCIITIS, LEFT 11/02/2008  . PSA, INCREASED 11/20/2007  .  PULMONARY NODULE 05/14/2008  . Unspecified Peripheral Vascular Disease 10/23/2006  . BPH (benign prostatic hyperplasia) 11/22/2010     Family History  Problem Relation Age of Onset  . Kidney disease Sister     Renal transplant  . Heart disease Mother   . Heart disease Father      History   Social History  . Marital Status: Married    Spouse Name: N/A    Number of Children: N/A  . Years of Education: N/A   Occupational History  . Not on file.   Social History Main Topics  . Smoking status: Former Smoker -- 1.00 packs/day for 15 years    Types: Cigarettes  . Smokeless tobacco: Never Used  . Alcohol Use: No  . Drug Use: Yes    Special: Marijuana  . Sexual Activity: Not on file   Other Topics Concern  . Not on file   Social History Narrative     No Known Allergies   Outpatient Prescriptions Prior to Visit  Medication Sig Dispense Refill  . amLODipine (NORVASC) 10 MG tablet TAKE 1 TABLET (10 MG TOTAL) BY MOUTH DAILY. 90 tablet 3  . aspirin 325 MG EC tablet Take 325 mg by mouth daily.      . CRESTOR 20 MG tablet TAKE 1 TABLET (20 MG TOTAL) BY MOUTH DAILY. 90 tablet 3  . lisinopril (PRINIVIL,ZESTRIL) 20 MG tablet TAKE 1 TABLET (20 MG TOTAL) BY MOUTH DAILY. 90 tablet 3  . Rivaroxaban (XARELTO STARTER PACK) 15 & 20 MG TBPK Take as directed on package: Start with one 15mg  tablet by mouth twice a day with food. On Day 22,  switch to one 20mg  tablet once a day with food. 51 each 0  . rivaroxaban (XARELTO) 20 MG TABS tablet Take 1 tablet (20 mg total) by mouth daily with supper. To begin after finish xarelto starter pack 30 tablet 5  . VIAGRA 100 MG tablet TAKE 1 TABLET (100 MG TOTAL) BY MOUTH DAILY AS NEEDED. 10 tablet 3  . ZETIA 10 MG tablet TAKE 1 TABLET (10 MG TOTAL) BY MOUTH DAILY. 90 tablet 3  . fluticasone (FLONASE) 50 MCG/ACT nasal spray Place 2 sprays into both nostrils daily. (Patient not taking: Reported on 02/23/2014) 16 g 3  . meloxicam (MOBIC) 15 MG tablet Take 1 tablet  (15 mg total) by mouth daily. (Patient not taking: Reported on 02/23/2014) 30 tablet 0  . traMADol (ULTRAM) 50 MG tablet Take 1 tablet (50 mg total) by mouth every 6 (six) hours as needed. (Patient not taking: Reported on 02/23/2014) 60 tablet 0   No facility-administered medications prior to visit.         Objective:   Physical Exam Filed Vitals:   02/23/14 1431 02/23/14 1432  BP:  114/78  Pulse:  93  Temp: 98.2 F (36.8 C)   TempSrc: Oral   Height: 6\' 4"  (1.93 m)   Weight: 161 lb (73.029 kg)   SpO2:  99%   Gen: Pleasant, thin, in no distress,  normal affect  ENT: No lesions,  mouth clear,  oropharynx clear, no postnasal drip  Neck: No JVD, no TMG, no carotid bruits  Lungs: No use of accessory muscles, clear without rales or rhonchi  Cardiovascular: RRR, heart sounds normal, no murmur or gallops, no peripheral edema  Musculoskeletal: No deformities, no cyanosis or clubbing  Neuro: alert, non focal  Skin: Warm, no lesions or rashes      Assessment & Plan:  Multiple nodules of lung Newly identified left lower lobe spiculated nodule with 2 surrounding nodules. These were not seen on his previous CT scan from 2012. His old right upper lobe nodule has been stable and is now less well appreciated. Given the entire clinical picture with recent pulmonary embolism this is very concerning for malignancy. Discussed with him the options for biopsy including a needle biopsy versus bronchoscopy. I believe that a navigational bronchoscopy would be highest yield and would avoid the peripheral surrounding emphysema that puts him at higher risk for pneumothorax. His anticoagulation will need to be bridged - the other option would be to wait for 3 months before doing his procedure when his anticoagulation could be stopped for safely.. We both agree that we would like to proceed with a possible tissue diagnosis sooner than that. I will admit him to the hospital for heparin bridging after he's been  on Xarelto for approximately 3 weeks  Acute pulmonary embolism Continue Xarelto for another 2 weeks and then I will admit him for heparin bridging to facilitate his procedure

## 2014-02-24 NOTE — Assessment & Plan Note (Signed)
stable overall by history and exam, recent data reviewed with pt, and pt to continue medical treatment as before,  to f/u any worsening symptoms or concerns SpO2 Readings from Last 3 Encounters:  02/23/14 99%  02/18/14 98%  01/28/14 98%

## 2014-02-24 NOTE — Assessment & Plan Note (Signed)
To cont xarelto for 6 mo course, consider long term in light of malignancy, will need holding and bridge lovenox for any pulm procedure such as biopsy

## 2014-02-24 NOTE — Assessment & Plan Note (Signed)
3 spiculated left  Nodules, possible malignanc, for pulm fu sooner than jan 11 if able to arrange,  to f/u any worsening symptoms or concerns

## 2014-03-02 ENCOUNTER — Institutional Professional Consult (permissible substitution): Payer: No Typology Code available for payment source | Admitting: Pulmonary Disease

## 2014-03-05 ENCOUNTER — Inpatient Hospital Stay (HOSPITAL_COMMUNITY)
Admission: RE | Admit: 2014-03-05 | Discharge: 2014-03-05 | Disposition: A | Discharge status: Home / Self Care | Payer: No Typology Code available for payment source | Source: Ambulatory Visit

## 2014-03-06 ENCOUNTER — Telehealth: Payer: Self-pay | Admitting: Emergency Medicine

## 2014-03-06 NOTE — Telephone Encounter (Signed)
He should be going to preadmit for  His instructions i don't know who would have called him Joellen Jersey

## 2014-03-06 NOTE — Telephone Encounter (Signed)
LMTCB

## 2014-03-06 NOTE — Telephone Encounter (Signed)
Called spoke with pt. He is scheduled for bronch on 03/11/14. Pt reports he was suppose to go to pre-admit yesterday but was called by [facility] and said he didn't have to come in. PCC's do you know if pt is suppose to still do pre-admit? thanks

## 2014-03-09 ENCOUNTER — Encounter (HOSPITAL_COMMUNITY): Payer: Self-pay | Admitting: General Practice

## 2014-03-09 ENCOUNTER — Inpatient Hospital Stay (HOSPITAL_COMMUNITY)
Admission: AD | Admit: 2014-03-09 | Discharge: 2014-03-12 | DRG: 168 | Disposition: A | Discharge status: Home / Self Care | Payer: 59 | Source: Ambulatory Visit | Attending: Emergency Medicine | Admitting: Emergency Medicine

## 2014-03-09 DIAGNOSIS — K219 Gastro-esophageal reflux disease without esophagitis: Secondary | ICD-10-CM | POA: Diagnosis present

## 2014-03-09 DIAGNOSIS — Z7901 Long term (current) use of anticoagulants: Secondary | ICD-10-CM

## 2014-03-09 DIAGNOSIS — R911 Solitary pulmonary nodule: Secondary | ICD-10-CM

## 2014-03-09 DIAGNOSIS — I739 Peripheral vascular disease, unspecified: Secondary | ICD-10-CM | POA: Diagnosis present

## 2014-03-09 DIAGNOSIS — Z87891 Personal history of nicotine dependence: Secondary | ICD-10-CM

## 2014-03-09 DIAGNOSIS — I10 Essential (primary) hypertension: Secondary | ICD-10-CM | POA: Diagnosis present

## 2014-03-09 DIAGNOSIS — Z9889 Other specified postprocedural states: Secondary | ICD-10-CM

## 2014-03-09 DIAGNOSIS — I2699 Other pulmonary embolism without acute cor pulmonale: Secondary | ICD-10-CM | POA: Diagnosis present

## 2014-03-09 DIAGNOSIS — E785 Hyperlipidemia, unspecified: Secondary | ICD-10-CM | POA: Diagnosis present

## 2014-03-09 DIAGNOSIS — Z79899 Other long term (current) drug therapy: Secondary | ICD-10-CM

## 2014-03-09 DIAGNOSIS — Z7982 Long term (current) use of aspirin: Secondary | ICD-10-CM

## 2014-03-09 DIAGNOSIS — Z86711 Personal history of pulmonary embolism: Secondary | ICD-10-CM

## 2014-03-09 DIAGNOSIS — R918 Other nonspecific abnormal finding of lung field: Secondary | ICD-10-CM | POA: Diagnosis present

## 2014-03-09 DIAGNOSIS — Z419 Encounter for procedure for purposes other than remedying health state, unspecified: Secondary | ICD-10-CM

## 2014-03-09 HISTORY — DX: Other pulmonary embolism without acute cor pulmonale: I26.99

## 2014-03-09 HISTORY — DX: Emphysema, unspecified: J43.9

## 2014-03-09 HISTORY — DX: Pneumonia, unspecified organism: J18.9

## 2014-03-09 HISTORY — DX: Acute embolism and thrombosis of unspecified deep veins of unspecified lower extremity: I82.409

## 2014-03-09 LAB — COMPREHENSIVE METABOLIC PANEL
ALT: 61 U/L — ABNORMAL HIGH (ref 0–53)
ANION GAP: 11 (ref 5–15)
AST: 88 U/L — ABNORMAL HIGH (ref 0–37)
Albumin: 3.4 g/dL — ABNORMAL LOW (ref 3.5–5.2)
Alkaline Phosphatase: 53 U/L (ref 39–117)
BUN: 18 mg/dL (ref 6–23)
CALCIUM: 8.7 mg/dL (ref 8.4–10.5)
CHLORIDE: 105 meq/L (ref 96–112)
CO2: 23 mmol/L (ref 19–32)
CREATININE: 1.41 mg/dL — AB (ref 0.50–1.35)
GFR calc Af Amer: 61 mL/min — ABNORMAL LOW (ref >90)
GFR, EST NON AFRICAN AMERICAN: 52 mL/min — AB (ref >90)
GLUCOSE: 99 mg/dL (ref 70–99)
Potassium: 4.3 mmol/L (ref 3.5–5.1)
SODIUM: 139 mmol/L (ref 135–145)
Total Bilirubin: 0.9 mg/dL (ref 0.3–1.2)
Total Protein: 7.1 g/dL (ref 6.0–8.3)

## 2014-03-09 LAB — CBC WITH DIFFERENTIAL/PLATELET
Basophils Absolute: 0 10*3/uL (ref 0.0–0.1)
Basophils Relative: 0 % (ref 0–1)
EOS ABS: 0 10*3/uL (ref 0.0–0.7)
Eosinophils Relative: 0 % (ref 0–5)
HEMATOCRIT: 39.3 % (ref 39.0–52.0)
HEMOGLOBIN: 13.8 g/dL (ref 13.0–17.0)
LYMPHS PCT: 28 % (ref 12–46)
Lymphs Abs: 2 10*3/uL (ref 0.7–4.0)
MCH: 33.6 pg (ref 26.0–34.0)
MCHC: 35.1 g/dL (ref 30.0–36.0)
MCV: 95.6 fL (ref 78.0–100.0)
Monocytes Absolute: 0.7 10*3/uL (ref 0.1–1.0)
Monocytes Relative: 10 % (ref 3–12)
Neutro Abs: 4.5 10*3/uL (ref 1.7–7.7)
Neutrophils Relative %: 62 % (ref 43–77)
Platelets: 179 10*3/uL (ref 150–400)
RBC: 4.11 MIL/uL — ABNORMAL LOW (ref 4.22–5.81)
RDW: 13.9 % (ref 11.5–15.5)
WBC: 7.2 10*3/uL (ref 4.0–10.5)

## 2014-03-09 LAB — PROTIME-INR
INR: 2.62 — ABNORMAL HIGH (ref 0.00–1.49)
PROTHROMBIN TIME: 28.3 s — AB (ref 11.6–15.2)

## 2014-03-09 LAB — MAGNESIUM: MAGNESIUM: 2.1 mg/dL (ref 1.5–2.5)

## 2014-03-09 LAB — PHOSPHORUS: PHOSPHORUS: 3.8 mg/dL (ref 2.3–4.6)

## 2014-03-09 LAB — APTT: APTT: 34 s (ref 24–37)

## 2014-03-09 MED ORDER — HEPARIN (PORCINE) IN NACL 100-0.45 UNIT/ML-% IJ SOLN
1050.0000 [IU]/h | INTRAMUSCULAR | Status: AC
  Administered 2014-03-09: 1000 [IU]/h via INTRAVENOUS
  Administered 2014-03-10: 1150 [IU]/h via INTRAVENOUS
  Filled 2014-03-09 (×3): qty 250

## 2014-03-09 MED ORDER — ENSURE COMPLETE PO LIQD
237.0000 mL | Freq: Two times a day (BID) | ORAL | Status: DC
  Administered 2014-03-10 – 2014-03-12 (×4): 237 mL via ORAL

## 2014-03-09 MED ORDER — SODIUM CHLORIDE 0.9 % IV SOLN
INTRAVENOUS | Status: DC
  Administered 2014-03-09: 18:00:00 via INTRAVENOUS

## 2014-03-09 MED ORDER — LISINOPRIL 20 MG PO TABS
20.0000 mg | ORAL_TABLET | Freq: Every day | ORAL | Status: DC
  Administered 2014-03-10 – 2014-03-12 (×3): 20 mg via ORAL
  Filled 2014-03-09 (×4): qty 1

## 2014-03-09 MED ORDER — SODIUM CHLORIDE 0.9 % IV SOLN: 250.0000 mL | INTRAVENOUS | Status: DC | PRN

## 2014-03-09 MED ORDER — FLUTICASONE PROPIONATE 50 MCG/ACT NA SUSP
2.0000 | Freq: Every day | NASAL | Status: DC
Start: 2014-03-09 — End: 2014-03-12
  Administered 2014-03-12: 2 via NASAL
  Filled 2014-03-09 (×2): qty 16

## 2014-03-09 MED ORDER — AMLODIPINE BESYLATE 10 MG PO TABS
10.0000 mg | ORAL_TABLET | Freq: Every day | ORAL | Status: DC
  Administered 2014-03-10 – 2014-03-12 (×3): 10 mg via ORAL
  Filled 2014-03-09 (×4): qty 1

## 2014-03-09 MED ORDER — EZETIMIBE 10 MG PO TABS
10.0000 mg | ORAL_TABLET | Freq: Every day | ORAL | Status: DC
  Administered 2014-03-10 – 2014-03-12 (×3): 10 mg via ORAL
  Filled 2014-03-09 (×4): qty 1

## 2014-03-09 MED ORDER — ROSUVASTATIN CALCIUM 20 MG PO TABS
20.0000 mg | ORAL_TABLET | Freq: Every day | ORAL | Status: DC
  Administered 2014-03-10 – 2014-03-12 (×3): 20 mg via ORAL
  Filled 2014-03-09 (×4): qty 1

## 2014-03-09 MED ORDER — TRAMADOL HCL 50 MG PO TABS
50.0000 mg | ORAL_TABLET | Freq: Four times a day (QID) | ORAL | Status: DC | PRN
  Administered 2014-03-09 – 2014-03-11 (×5): 50 mg via ORAL
  Filled 2014-03-09 (×5): qty 1

## 2014-03-09 NOTE — Telephone Encounter (Signed)
Mr Marschall will be admitted - that's why he didn;t have to go to the pre-admit appointment. I have him a bed on [facility] to be admitted 1/18. I called and left hima message this am - will call him back in a bit to confirm with him.

## 2014-03-09 NOTE — Telephone Encounter (Signed)
i have spoken Charles [name] Charles [name] and he has been told to go to [facility] sometime today to admitting to be admitted Charles [name]

## 2014-03-09 NOTE — H&P (Signed)
Name: Charles [name] MRN: [id number] DOB: 1950/12/29    ADMISSION DATE:  03/09/2014 CONSULTATION DATE: 1/18   CHIEF COMPLAINT:  Pulmonary Nodule  BRIEF PATIENT DESCRIPTION:  64 year old former cigarette smoker, THC smoker, PVD, suspected COPD, and recent CT chest 12/30 showed R sided PE and a LLL spiculated nodule with 2 other smaller associated nodules concerning for malignancy. LE dopplers show LLE femoral DVT. He was started on Xarelto and then sent to our office given concern for malignancy. He was seen by Dr Lamonte Sakai who evaluated his films. He is now being admitted for EMB for tissue bx.     SIGNIFICANT EVENTS    STUDIES:  CT chest 12/20: 1. Acute RIGHT middle and RIGHT lower lobe pulmonary embolus with lateral RIGHT middle lobe pulmonary infarct.2. Three spiculated LEFT lung pulmonary nodules with the largest in the superior segment of the LEFT lower lobe compatible with bronchogenic carcinoma. 3. Centrilobular and paraseptal emphysema.   HISTORY OF PRESENT ILLNESS:   64 year old former cigarette smoker, THC smoker, PVD, suspected COPD. He began to have some L CP, Minimal cough in October. CXR 11/5, 12/10 showed right upper lobe infiltrate. He was treated with antibiotics with some resolution of his symptoms. A CT chest 12/30 showed R sided PE and a LLL spiculated nodule with 2 other smaller associated nodules concerning for malignancy. LE dopplers show LLE femoral DVT. He was started on Xarelto and then sent to our office given concern for malignancy. He was seen by Dr Lamonte Sakai who evaluated his films. He is now being admitted for EMB for tissue bx.   PAST MEDICAL HISTORY :   has a past medical history of ABNORMAL ELECTROCARDIOGRAM (11/02/2008); ABSCESS, LUNG (10/23/2006); BACTERIAL PNEUMONIA (12/28/2009); C O P D (05/12/2009); CHEST PAIN (12/24/2009); Cramp of limb (07/19/2007); DIVERTICULOSIS, COLON (03/07/2007); ERECTILE DYSFUNCTION (10/23/2006); FLANK PAIN, LEFT (02/25/2010); FREQUENCY,  URINARY (12/24/2009); GERD (10/23/2006); HEMORRHOIDS (10/23/2006); HYPERLIPIDEMIA (10/23/2006); HYPERTENSION (10/23/2006); PLANTAR FASCIITIS, LEFT (11/02/2008); PSA, INCREASED (11/20/2007); PULMONARY NODULE (05/14/2008); Unspecified Peripheral Vascular Disease (10/23/2006); and BPH (benign prostatic hyperplasia) (11/22/2010).  has past surgical history that includes Hernia repair and Hemorrhoid surgery. Prior to Admission medications   Medication Sig Start Date End Date Taking? Authorizing Provider  amLODipine (NORVASC) 10 MG tablet TAKE 1 TABLET (10 MG TOTAL) BY MOUTH DAILY. 12/10/12   Biagio Borg, MD  aspirin 325 MG EC tablet Take 325 mg by mouth daily.      Historical Provider, MD  CRESTOR 20 MG tablet TAKE 1 TABLET (20 MG TOTAL) BY MOUTH DAILY. 12/10/12   Biagio Borg, MD  fluticasone (FLONASE) 50 MCG/ACT nasal spray Place 2 sprays into both nostrils daily. Patient not taking: Reported on 02/23/2014 11/15/13   Ria Bush, MD  lisinopril (PRINIVIL,ZESTRIL) 20 MG tablet TAKE 1 TABLET (20 MG TOTAL) BY MOUTH DAILY. 12/12/13   Biagio Borg, MD  meloxicam (MOBIC) 15 MG tablet Take 1 tablet (15 mg total) by mouth daily. Patient not taking: Reported on 02/23/2014 09/17/13   Lyndal Pulley, DO  Rivaroxaban (XARELTO STARTER PACK) 15 & 20 MG TBPK Take as directed on package: Start with one 15mg  tablet by mouth twice a day with food. On Day 22, switch to one 20mg  tablet once a day with food. 02/18/14   Biagio Borg, MD  rivaroxaban (XARELTO) 20 MG TABS tablet Take 1 tablet (20 mg total) by mouth daily with supper. To begin after finish xarelto starter pack 02/18/14   Biagio Borg, MD  traMADol Veatrice Bourbon) 50 MG  tablet Take 1 tablet (50 mg total) by mouth every 6 (six) hours as needed. Patient not taking: Reported on 02/23/2014 10/30/13   Biagio Borg, MD  VIAGRA 100 MG tablet TAKE 1 TABLET (100 MG TOTAL) BY MOUTH DAILY AS NEEDED. 01/12/14   Biagio Borg, MD  ZETIA 10 MG tablet TAKE 1 TABLET (10 MG TOTAL) BY MOUTH DAILY.  12/17/13   Biagio Borg, MD   No Known Allergies  FAMILY HISTORY:  family history includes Heart disease in his father and mother; Kidney disease in his sister. SOCIAL HISTORY:  reports that he has quit smoking. His smoking use included Cigarettes. He has a 15 pack-year smoking history. He has never used smokeless tobacco. He reports that he uses illicit drugs (Marijuana). He reports that he does not drink alcohol.  REVIEW OF SYSTEMS:   Constitutional: Negative for fever, chills, weight loss, malaise/fatigue and diaphoresis.  HENT: Negative for hearing loss, ear pain, nosebleeds, congestion, sore throat, neck pain, tinnitus and ear discharge.   Eyes: Negative for blurred vision, double vision, photophobia, pain, discharge and redness.  Respiratory: Negative for cough, hemoptysis, sputum production, shortness of breath, wheezing and stridor.   Cardiovascular: Negative for chest pain, palpitations, orthopnea, claudication, leg swelling and PND.  Gastrointestinal: Negative for heartburn, nausea, vomiting, abdominal pain, diarrhea, constipation, blood in stool and melena.  Genitourinary: Negative for dysuria, urgency, frequency, hematuria and flank pain.  Musculoskeletal: Negative for myalgias, back pain, joint pain and falls.  Skin: Negative for itching and rash.  Neurological: Negative for dizziness, tingling, tremors, sensory change, speech change, focal weakness, seizures, loss of consciousness, weakness and headaches.  Endo/Heme/Allergies: Negative for environmental allergies and polydipsia. Does not bruise/bleed easily.  SUBJECTIVE:  No distress  VITAL SIGNS: Temp:  [99.2 F (37.3 C)] 99.2 F (37.3 C) (01/18 1514) Pulse Rate:  [98] 98 (01/18 1514) Resp:  [18] 18 (01/18 1514) BP: (145)/(83) 145/83 mmHg (01/18 1514) SpO2:  [100 %] 100 % (01/18 1514) Weight:  [72.576 kg (160 lb)] 72.576 kg (160 lb) (01/18 1514)  PHYSICAL EXAMINATION: General:  64 year old male, in no acute distress.   Neuro:  Awake, oriented, no focal def  HEENT:  [place], no JVD, poor dentition  Cardiovascular:  rrr Lungs:  CTA  Abdomen:  Soft, non-tender  Musculoskeletal:  Intact  Skin:  Intact   No results for input(s): NA, K, CL, CO2, BUN, CREATININE, GLUCOSE in the last 168 hours. No results for input(s): HGB, HCT, WBC, PLT in the last 168 hours. No results found.  ASSESSMENT / PLAN:  Left Spiculated Lung nodules w/ largest in superior seg of LLL.  Plan For EMB 1/20 NPO after midnight 1/20  Recent PE/DVT. On xarelto since 12/30 Plan D/c xarelto. Initiate heparin bridge per pharmacy   HTN Plan  cont home rx   Erick Colace ACNP-BC [place] Pager # [phone] OR # [phone] if no answer  03/09/2014, 3:50 PM  Patient seen an examined.  Will admit to floor, start heparin drip, stop oral anticoag, plan EMB on 1/20.  Resume home medications.  Patient appears well.  Patient seen and examined, agree with above note.  I dictated the care and orders written for this patient under my direction.  Rush Farmer, MD [phone]

## 2014-03-09 NOTE — Telephone Encounter (Signed)
I spoke with Mr Bir. He is planning to go to [facility] around 14:00 1/18 to be admitted. Will need to hold xarelto, cover with heparin gtt in prep for procedure on 1/20.

## 2014-03-09 NOTE — Progress Notes (Signed)
ANTICOAGULATION CONSULT NOTE - Initial Consult  Pharmacy Consult for heparin Indication: pulmonary embolus  No Known Allergies  Patient Measurements: Height: 6\' 4"  (193 cm) Weight: 160 lb (72.576 kg) IBW/kg (Calculated) : 86.8 Heparin Dosing Weight: 72.6kg  Vital Signs: Temp: 99.2 F (37.3 C) (01/18 1514) Temp Source: Oral (01/18 1514) BP: 145/83 mmHg (01/18 1514) Pulse Rate: 98 (01/18 1514)  Labs: No results for input(s): HGB, HCT, PLT, APTT, LABPROT, INR, HEPARINUNFRC, CREATININE, CKTOTAL, CKMB, TROPONINI in the last 72 hours.  CrCl cannot be calculated (Patient has no serum creatinine result on file.).   Medical History: Past Medical History  Diagnosis Date  . ABNORMAL ELECTROCARDIOGRAM 11/02/2008  . ABSCESS, LUNG 10/23/2006  . BACTERIAL PNEUMONIA 12/28/2009  . C O P D 05/12/2009  . CHEST PAIN 12/24/2009  . Cramp of limb 07/19/2007  . DIVERTICULOSIS, COLON 03/07/2007  . 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/23/2006  . FLANK PAIN, LEFT 02/25/2010  . FREQUENCY, URINARY 12/24/2009  . GERD 10/23/2006  . HEMORRHOIDS 10/23/2006  . HYPERLIPIDEMIA 10/23/2006  . HYPERTENSION 10/23/2006  . PLANTAR FASCIITIS, LEFT 11/02/2008  . PSA, INCREASED 11/20/2007  . PULMONARY NODULE 05/14/2008  . Unspecified Peripheral Vascular Disease 10/23/2006  . BPH (benign prostatic hyperplasia) 11/22/2010    Medications:  Prescriptions prior to admission  Medication Sig Dispense Refill Last Dose  . amLODipine (NORVASC) 10 MG tablet TAKE 1 TABLET (10 MG TOTAL) BY MOUTH DAILY. 90 tablet 3 Taking  . aspirin 325 MG EC tablet Take 325 mg by mouth daily.     Taking  . CRESTOR 20 MG tablet TAKE 1 TABLET (20 MG TOTAL) BY MOUTH DAILY. 90 tablet 3 Taking  . fluticasone (FLONASE) 50 MCG/ACT nasal spray Place 2 sprays into both nostrils daily. (Patient not taking: Reported on 02/23/2014) 16 g 3 Not Taking  . lisinopril (PRINIVIL,ZESTRIL) 20 MG tablet TAKE 1 TABLET (20 MG TOTAL) BY MOUTH DAILY. 90 tablet 3 Taking  . meloxicam (MOBIC) 15 MG  tablet Take 1 tablet (15 mg total) by mouth daily. (Patient not taking: Reported on 02/23/2014) 30 tablet 0 Not Taking  . Rivaroxaban (XARELTO STARTER PACK) 15 & 20 MG TBPK Take as directed on package: Start with one 15mg  tablet by mouth twice a day with food. On Day 22, switch to one 20mg  tablet once a day with food. 51 each 0 Taking  . rivaroxaban (XARELTO) 20 MG TABS tablet Take 1 tablet (20 mg total) by mouth daily with supper. To begin after finish xarelto starter pack 30 tablet 5 Taking  . traMADol (ULTRAM) 50 MG tablet Take 1 tablet (50 mg total) by mouth every 6 (six) hours as needed. (Patient not taking: Reported on 02/23/2014) 60 tablet 0 Not Taking  . VIAGRA 100 MG tablet TAKE 1 TABLET (100 MG TOTAL) BY MOUTH DAILY AS NEEDED. 10 tablet 3 Taking  . ZETIA 10 MG tablet TAKE 1 TABLET (10 MG TOTAL) BY MOUTH DAILY. 90 tablet 3 Taking    Assessment: 64 year old man admitted for scheduled lung biopsy on 1/20 to continue on anticoagulation for history of pulmonary embolism which was diagnosed on 02/18/14.  He has been on Xarelto 15mg  BID since then (was supposed to transition to 20mg  daily on 03/11/14).  The plan is to stop Xarelto (last dose this morning) and bridge with IV heparin until his procedure on 1/20.  Goal of Therapy:  aPTT 66-102 seconds Monitor platelets by anticoagulation protocol: Yes   Plan:  Start heparin infusion at 10pm at 1000 units/hr Check PTT  6 hours later - adjust per goal (will not check heparin levels here because heparin level will be elevated due to Xarelto) Daily CBC while on heparin  Candie Mile 03/09/2014,4:16 PM

## 2014-03-09 NOTE — Progress Notes (Addendum)
Called Dr Baltazar Apo office at [facility]. Made aware of patient admission to [id number].Marland Kitchen

## 2014-03-10 DIAGNOSIS — R911 Solitary pulmonary nodule: Secondary | ICD-10-CM

## 2014-03-10 DIAGNOSIS — Z7901 Long term (current) use of anticoagulants: Secondary | ICD-10-CM

## 2014-03-10 LAB — CBC
HCT: 39.5 % (ref 39.0–52.0)
Hemoglobin: 13.8 g/dL (ref 13.0–17.0)
MCH: 33.3 pg (ref 26.0–34.0)
MCHC: 34.9 g/dL (ref 30.0–36.0)
MCV: 95.4 fL (ref 78.0–100.0)
Platelets: 166 10*3/uL (ref 150–400)
RBC: 4.14 MIL/uL — ABNORMAL LOW (ref 4.22–5.81)
RDW: 13.6 % (ref 11.5–15.5)
WBC: 7.4 10*3/uL (ref 4.0–10.5)

## 2014-03-10 LAB — APTT
aPTT: 60 seconds — ABNORMAL HIGH (ref 24–37)
aPTT: 108 seconds — ABNORMAL HIGH (ref 24–37)
aPTT: 106 seconds — ABNORMAL HIGH (ref 24–37)

## 2014-03-10 LAB — HEPARIN LEVEL (UNFRACTIONATED): Heparin Unfractionated: 1.14 IU/mL — ABNORMAL HIGH (ref 0.30–0.70)

## 2014-03-10 NOTE — Progress Notes (Signed)
INITIAL NUTRITION ASSESSMENT  DOCUMENTATION CODES Per approved criteria  -Not Applicable   INTERVENTION: - Ensure Complete po BID, each supplement provides 350 kcal and 13 grams of protein  NUTRITION DIAGNOSIS: Inadequate oral intake related to loss of appetite as evidenced by poor po.   Goal: Pt to meet >/= 90% of their estimated nutrition needs   Monitor:  Weight trend, po intake, acceptance of supplements, labs  Reason for Assessment: MST  64 y.o. male  Admitting Dx: <principal problem not specified>  ASSESSMENT: 64 year old former cigarette smoker, THC smoker, PVD, suspected COPD, and recent CT chest 12/30 showed R sided PE and a LLL spiculated nodule with 2 other smaller associated nodules concerning for malignancy. LE dopplers show LLE femoral DVT. He was started on Xarelto and then sent to our office given concern for malignancy. He was seen by Dr Lamonte Sakai who evaluated his films. He is now being admitted for EMB for tissue bx.   - Pt reports weight los of about 10 lbs over the past several months. Pt with poor appetite. Ate ~30% of lunch today.  - Pt to have lung nodule biopsy tomorrow.  Labs: Na and K WNL Albumin low AST and ALT elevated  Height: Ht Readings from Last 1 Encounters:  03/09/14 6\' 4"  (1.93 m)    Weight: Wt Readings from Last 1 Encounters:  03/10/14 158 lb 12.8 oz (72.031 kg)    Ideal Body Weight: 86.8 kg  % Ideal Body Weight: 83%  Wt Readings from Last 10 Encounters:  03/10/14 158 lb 12.8 oz (72.031 kg)  02/23/14 161 lb (73.029 kg)  02/18/14 160 lb 8 oz (72.802 kg)  01/28/14 163 lb (73.936 kg)  12/24/13 159 lb (72.122 kg)  11/20/13 165 lb (74.844 kg)  11/15/13 167 lb (75.751 kg)  10/30/13 170 lb 2 oz (77.168 kg)  10/02/13 169 lb (76.658 kg)  09/24/13 170 lb (77.111 kg)    Usual Body Weight: 165-170 lbs  % Usual Body Weight: 96%  BMI:  Body mass index is 19.34 kg/(m^2).  Estimated Nutritional Needs: Kcal: 1800-2000 Protein:  90-100 g Fluid: 1.8-2.0 L/day  Skin: Intact  Diet Order: Diet regular  EDUCATION NEEDS: -Education needs addressed  No intake or output data in the 24 hours ending 03/10/14 1519  Last BM: prior to admission   Labs:   Recent Labs Lab 03/09/14 1700  NA 139  K 4.3  CL 105  CO2 23  BUN 18  CREATININE 1.41*  CALCIUM 8.7  MG 2.1  PHOS 3.8  GLUCOSE 99    CBG (last 3)  No results for input(s): GLUCAP in the last 72 hours.  Scheduled Meds: . amLODipine  10 mg Oral Daily  . ezetimibe  10 mg Oral Daily  . feeding supplement (ENSURE COMPLETE)  237 mL Oral BID BM  . fluticasone  2 spray Each Nare Daily  . lisinopril  20 mg Oral Daily  . rosuvastatin  20 mg Oral Daily    Continuous Infusions: . sodium chloride 10 mL/hr at 03/09/14 1748  . heparin 1,150 Units/hr (03/10/14 1517)    Past Medical History  Diagnosis Date  . ABNORMAL ELECTROCARDIOGRAM 11/02/2008  . ABSCESS, LUNG 10/23/2006  . CHEST PAIN 12/24/2009  . Cramp of limb 07/19/2007  . DIVERTICULOSIS, COLON 03/07/2007  . 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/23/2006  . FLANK PAIN, LEFT 02/25/2010  . FREQUENCY, URINARY 12/24/2009  . HEMORRHOIDS 10/23/2006  . HYPERLIPIDEMIA 10/23/2006  . HYPERTENSION 10/23/2006  . PLANTAR FASCIITIS, LEFT 11/02/2008  . PSA,  INCREASED 11/20/2007  . PULMONARY NODULE 05/14/2008  . Unspecified Peripheral Vascular Disease 10/23/2006  . BPH (benign prostatic hyperplasia) 11/22/2010  . DVT (deep venous thrombosis) 01/2014    LLE  . PE (pulmonary embolism) 02/2014  . Emphysema of lung   . BACTERIAL PNEUMONIA 12/28/2009  . Pneumonia 12/2013 X 2    Past Surgical History  Procedure Laterality Date  . Hemorrhoid surgery  ?1990's  . Inguinal hernia repair Bilateral ?2000's  . Shoulder arthroscopy w/ rotator cuff repair Right 11/2013    Laurette Schimke MS, RD, LDN

## 2014-03-10 NOTE — Progress Notes (Signed)
ANTICOAGULATION CONSULT NOTE - Follow Up Consult  Pharmacy Consult for heparin Indication: pulmonary embolus   Labs:  Recent Labs  03/09/14 1700 03/10/14 0412  HGB 13.8 13.8  HCT 39.3 39.5  PLT 179 166  APTT 34 60*  LABPROT 28.3*  --   INR 2.62*  --   CREATININE 1.41*  --     Assessment: 63yo male subtherapeutic on heparin with initial dosing while Xarelto on hold.  Goal of Therapy:  aPTT 66-102 seconds   Plan:  Will increase heparin gtt by 2-3 units/kg/hr to 1200 units/hr and check PTT in 6hr.  Wynona Neat, PharmD, BCPS  03/10/2014,6:02 AM

## 2014-03-10 NOTE — Progress Notes (Signed)
ANTICOAGULATION CONSULT NOTE - Follow Up Consult  Pharmacy Consult for heparin Indication: pulmonary embolus  No Known Allergies  Patient Measurements: Height: 6\' 4"  (193 cm) Weight: 158 lb 12.8 oz (72.031 kg) IBW/kg (Calculated) : 86.8 Heparin Dosing Weight:   Vital Signs: Temp: 98.5 F (36.9 C) (01/19 1505) Temp Source: Oral (01/19 1505) BP: 118/80 mmHg (01/19 1505) Pulse Rate: 78 (01/19 1505)  Labs:  Recent Labs  03/09/14 1700 03/10/14 0412 03/10/14 1211  HGB 13.8 13.8  --   HCT 39.3 39.5  --   PLT 179 166  --   APTT 34 60* 108*  LABPROT 28.3*  --   --   INR 2.62*  --   --   CREATININE 1.41*  --   --     Estimated Creatinine Clearance: 56 mL/min (by C-G formula based on Cr of 1.41).   Medications:  Scheduled:  . amLODipine  10 mg Oral Daily  . ezetimibe  10 mg Oral Daily  . feeding supplement (ENSURE COMPLETE)  237 mL Oral BID BM  . fluticasone  2 spray Each Nare Daily  . lisinopril  20 mg Oral Daily  . rosuvastatin  20 mg Oral Daily   Infusions:  . sodium chloride 10 mL/hr at 03/09/14 1748  . heparin 1,150 Units/hr (03/10/14 1820)    Assessment: 64 yo male with PE is currently on slightly supratherapeutic heparin.  Heparin level is 1.14 and aPTT is 106.  Goal of Therapy:  aPTT 66-102 seconds  Monitor platelets by anticoagulation protocol: Yes   Plan:  - reduce heparin to 1050 units/hr. Will not recheck aPTT at this time since infusion will be stopped at 0200 in anticipation of surgery tomorrow as requested by the medical team - f/u post surgery to see plan on anticoagulation  Rishika Mccollom, Tsz-Yin 03/10/2014,6:45 PM

## 2014-03-10 NOTE — Progress Notes (Signed)
   Name: Charles [name] MRN: [id number] DOB: 1950/09/02    ADMISSION DATE:  03/09/2014 CONSULTATION DATE: 1/18   CHIEF COMPLAINT:  Pulmonary Nodule  BRIEF PATIENT DESCRIPTION:  64 year old former cigarette smoker, THC smoker, PVD, suspected COPD, and recent CT chest 12/30 showed R sided PE and a LLL spiculated nodule with 2 other smaller associated nodules concerning for malignancy. LE dopplers show LLE femoral DVT. He was started on Xarelto and then sent to our office given concern for malignancy. He was seen by Dr Lamonte Sakai who evaluated his films. He is now being admitted for EMB for tissue bx.     SIGNIFICANT EVENTS    STUDIES:  CT chest 12/20: 1. Acute RIGHT middle and RIGHT lower lobe pulmonary embolus with lateral RIGHT middle lobe pulmonary infarct.2. Three spiculated LEFT lung pulmonary nodules with the largest in the superior segment of the LEFT lower lobe compatible with bronchogenic carcinoma. 3. Centrilobular and paraseptal emphysema.   SUBJECTIVE:  No distress; no issues over night  VITAL SIGNS: Temp:  [97.7 F (36.5 C)-99.3 F (37.4 C)] 97.7 F (36.5 C) (01/19 0554) Pulse Rate:  [78-101] 101 (01/19 0554) Resp:  [17-18] 17 (01/19 0554) BP: (145-153)/(83-88) 152/88 mmHg (01/19 0554) SpO2:  [100 %] 100 % (01/19 0554) Weight:  [72.031 kg (158 lb 12.8 oz)-72.576 kg (160 lb)] 72.031 kg (158 lb 12.8 oz) (01/19 0500)  PHYSICAL EXAMINATION: General:  63 year old male, in no acute distress.  Neuro:  Awake, oriented, no focal def  HEENT:  [place], no JVD, poor dentition  Cardiovascular:  rrr Lungs:  CTA;  No change  Abdomen:  Soft, non-tender  Musculoskeletal:  Intact  Skin:  Intact    Recent Labs Lab 03/09/14 1700  NA 139  K 4.3  CL 105  CO2 23  BUN 18  CREATININE 1.41*  GLUCOSE 99    Recent Labs Lab 03/09/14 1700 03/10/14 0412  HGB 13.8 13.8  HCT 39.3 39.5  WBC 7.2 7.4  PLT 179 166   No results found.  ASSESSMENT / PLAN:  Left Spiculated Lung  nodules w/ largest in superior seg of LLL.  Plan For EMB 1/20 NPO after midnight 1/20 (tonight)   Recent PE/DVT. On xarelto since 12/30 Plan D/c xarelto. Initiate heparin bridge per pharmacy   HTN Plan  cont home rx   Charles [name] ACNP-BC [place] Pager # [phone] OR # [phone] if no answer  Reviewed above.  He is set up for bronch on 03/11/14 with Dr. Lamonte Sakai.  Chesley Mires, MD [facility] Pulmonary/Critical Care 03/10/2014, 11:51 AM Pager:  [phone] After 3pm call: [phone]

## 2014-03-10 NOTE — Progress Notes (Signed)
ANTICOAGULATION CONSULT NOTE - Follow Up Consult  Pharmacy Consult for heparin Indication: pulmonary embolus   Labs:  Recent Labs  03/09/14 1700 03/10/14 0412 03/10/14 1211  HGB 13.8 13.8  --   HCT 39.3 39.5  --   PLT 179 166  --   APTT 34 60* 108*  LABPROT 28.3*  --   --   INR 2.62*  --   --   CREATININE 1.41*  --   --     Assessment: 63yo male subtherapeutic on heparin with initial dosing while Xarelto on hold. APTT this afternoon is now mildly supra-therapeutic at 108. RN reports no s/s of bleeding.   Goal of Therapy:  aPTT 66-102 seconds   Plan:  -Will decrease heparin infusion to 1150 units/hr and check PTT and Anti-Xa level in 6hr. -May d/c aPTT if heparin level co-relates.  -Monitor daily CBC and s/s of bleeding     Albertina Parr, PharmD., BCPS Clinical Pharmacist Pager [phone]

## 2014-03-11 ENCOUNTER — Inpatient Hospital Stay (HOSPITAL_COMMUNITY): Payer: 59

## 2014-03-11 ENCOUNTER — Encounter (HOSPITAL_COMMUNITY)
Admission: AD | Disposition: A | Discharge status: Home / Self Care | Payer: Self-pay | Source: Ambulatory Visit | Attending: Emergency Medicine

## 2014-03-11 ENCOUNTER — Inpatient Hospital Stay (HOSPITAL_COMMUNITY): Payer: 59 | Admitting: Anesthesiology

## 2014-03-11 DIAGNOSIS — I10 Essential (primary) hypertension: Secondary | ICD-10-CM

## 2014-03-11 HISTORY — PX: VIDEO BRONCHOSCOPY WITH ENDOBRONCHIAL NAVIGATION: SHX6175

## 2014-03-11 LAB — APTT
APTT: 35 s (ref 24–37)
APTT: 56 s — AB (ref 24–37)

## 2014-03-11 LAB — CBC
HCT: 37.4 % — ABNORMAL LOW (ref 39.0–52.0)
Hemoglobin: 13 g/dL (ref 13.0–17.0)
MCH: 32.7 pg (ref 26.0–34.0)
MCHC: 34.8 g/dL (ref 30.0–36.0)
MCV: 94 fL (ref 78.0–100.0)
PLATELETS: 145 10*3/uL — AB (ref 150–400)
RBC: 3.98 MIL/uL — AB (ref 4.22–5.81)
RDW: 13.7 % (ref 11.5–15.5)
WBC: 6.4 10*3/uL (ref 4.0–10.5)

## 2014-03-11 LAB — SURGICAL PCR SCREEN
MRSA, PCR: NEGATIVE
Staphylococcus aureus: NEGATIVE

## 2014-03-11 LAB — HEPARIN LEVEL (UNFRACTIONATED): Heparin Unfractionated: 0.29 IU/mL — ABNORMAL LOW (ref 0.30–0.70)

## 2014-03-11 SURGERY — VIDEO BRONCHOSCOPY WITH ENDOBRONCHIAL NAVIGATION
Anesthesia: General | Site: Bronchus

## 2014-03-11 MED ORDER — 0.9 % SODIUM CHLORIDE (POUR BTL) OPTIME
TOPICAL | Status: DC | PRN
  Administered 2014-03-11: 1000 mL

## 2014-03-11 MED ORDER — MIDAZOLAM HCL 5 MG/5ML IJ SOLN
INTRAMUSCULAR | Status: DC | PRN
  Administered 2014-03-11: 2 mg via INTRAVENOUS

## 2014-03-11 MED ORDER — HEPARIN (PORCINE) IN NACL 100-0.45 UNIT/ML-% IJ SOLN
1200.0000 [IU]/h | INTRAMUSCULAR | Status: AC
  Administered 2014-03-11: 1050 [IU]/h via INTRAVENOUS
  Filled 2014-03-11 (×3): qty 250

## 2014-03-11 MED ORDER — ROCURONIUM BROMIDE 100 MG/10ML IV SOLN
INTRAVENOUS | Status: DC | PRN
Start: 2014-03-11 — End: 2014-03-11
  Administered 2014-03-11: 50 mg via INTRAVENOUS

## 2014-03-11 MED ORDER — ONDANSETRON HCL 4 MG/2ML IJ SOLN
INTRAMUSCULAR | Status: DC | PRN
  Administered 2014-03-11: 4 mg via INTRAVENOUS

## 2014-03-11 MED ORDER — EPINEPHRINE HCL 1 MG/ML IJ SOLN
INTRAMUSCULAR | Status: AC
  Filled 2014-03-11: qty 1

## 2014-03-11 MED ORDER — PROPOFOL 10 MG/ML IV BOLUS
INTRAVENOUS | Status: AC
  Filled 2014-03-11: qty 20

## 2014-03-11 MED ORDER — LIDOCAINE HCL (CARDIAC) 20 MG/ML IV SOLN
INTRAVENOUS | Status: DC | PRN
  Administered 2014-03-11: 50 mg via INTRAVENOUS

## 2014-03-11 MED ORDER — PHENYLEPHRINE HCL 10 MG/ML IJ SOLN
INTRAMUSCULAR | Status: DC | PRN
  Administered 2014-03-11 (×3): 80 ug via INTRAVENOUS
  Administered 2014-03-11: 40 ug via INTRAVENOUS
  Administered 2014-03-11: 120 ug via INTRAVENOUS

## 2014-03-11 MED ORDER — LACTATED RINGERS IV SOLN
INTRAVENOUS | Status: DC | PRN
  Administered 2014-03-11 (×2): via INTRAVENOUS

## 2014-03-11 MED ORDER — HEPARIN (PORCINE) IN NACL 100-0.45 UNIT/ML-% IJ SOLN
1050.0000 [IU]/h | INTRAMUSCULAR | Status: DC
  Filled 2014-03-11: qty 250

## 2014-03-11 MED ORDER — EPHEDRINE SULFATE 50 MG/ML IJ SOLN
INTRAMUSCULAR | Status: DC | PRN
  Administered 2014-03-11 (×3): 10 mg via INTRAVENOUS

## 2014-03-11 MED ORDER — FENTANYL CITRATE 0.05 MG/ML IJ SOLN
INTRAMUSCULAR | Status: AC
  Filled 2014-03-11: qty 5

## 2014-03-11 MED ORDER — MIDAZOLAM HCL 2 MG/2ML IJ SOLN
INTRAMUSCULAR | Status: AC
  Filled 2014-03-11: qty 2

## 2014-03-11 MED ORDER — HYDROMORPHONE HCL 1 MG/ML IJ SOLN: 0.2500 mg | INTRAMUSCULAR | Status: DC | PRN

## 2014-03-11 MED ORDER — FENTANYL CITRATE 0.05 MG/ML IJ SOLN
INTRAMUSCULAR | Status: DC | PRN
  Administered 2014-03-11 (×2): 100 ug via INTRAVENOUS
  Administered 2014-03-11: 50 ug via INTRAVENOUS

## 2014-03-11 MED ORDER — NEOSTIGMINE METHYLSULFATE 10 MG/10ML IV SOLN
INTRAVENOUS | Status: DC | PRN
  Administered 2014-03-11: 3 mg via INTRAVENOUS

## 2014-03-11 MED ORDER — GLYCOPYRROLATE 0.2 MG/ML IJ SOLN
INTRAMUSCULAR | Status: DC | PRN
  Administered 2014-03-11: .4 mg via INTRAVENOUS

## 2014-03-11 MED ORDER — MEPERIDINE HCL 25 MG/ML IJ SOLN: 6.2500 mg | INTRAMUSCULAR | Status: DC | PRN

## 2014-03-11 MED ORDER — PROPOFOL 10 MG/ML IV BOLUS
INTRAVENOUS | Status: DC | PRN
Start: 2014-03-11 — End: 2014-03-11
  Administered 2014-03-11: 150 mg via INTRAVENOUS

## 2014-03-11 MED ORDER — ONDANSETRON HCL 4 MG/2ML IJ SOLN: 4.0000 mg | Freq: Once | INTRAMUSCULAR | Status: DC | PRN

## 2014-03-11 MED ORDER — LIDOCAINE HCL 4 % MT SOLN
OROMUCOSAL | Status: DC | PRN
  Administered 2014-03-11: 4 mL via TOPICAL

## 2014-03-11 SURGICAL SUPPLY — 33 items
BRUSH CYTOL CELLEBRITY 1.5X140 (MISCELLANEOUS) ×3 IMPLANT
BRUSH SUPERTRAX BIOPSY (INSTRUMENTS) IMPLANT
BRUSH SUPERTRAX NDL-TIP CYTO (INSTRUMENTS) IMPLANT
CANISTER SUCTION 2500CC (MISCELLANEOUS) ×3 IMPLANT
CHANNEL WORK EXTEND EDGE 180 (KITS) IMPLANT
CHANNEL WORK EXTEND EDGE 45 (KITS) IMPLANT
CHANNEL WORK EXTEND EDGE 90 (KITS) IMPLANT
CONT SPEC 4OZ CLIKSEAL STRL BL (MISCELLANEOUS) ×3 IMPLANT
COVER TABLE BACK 60X90 (DRAPES) ×3 IMPLANT
FILTER STRAW FLUID ASPIR (MISCELLANEOUS) IMPLANT
FORCEPS BIOP SUPERTRX PREMAR (INSTRUMENTS) IMPLANT
GAUZE SPONGE 4X4 12PLY STRL (GAUZE/BANDAGES/DRESSINGS) ×3 IMPLANT
GLOVE BIO SURGEON STRL SZ7.5 (GLOVE) ×6 IMPLANT
KIT CLEAN ENDO COMPLIANCE (KITS) ×3 IMPLANT
KIT LOCATABLE GUIDE (CANNULA) IMPLANT
KIT MARKER FIDUCIAL DELIVERY (KITS) IMPLANT
KIT PROCEDURE EDGE 180 (KITS) IMPLANT
KIT PROCEDURE EDGE 45 (KITS) IMPLANT
KIT PROCEDURE EDGE 90 (KITS) IMPLANT
KIT ROOM TURNOVER OR (KITS) ×3 IMPLANT
MARKER SKIN DUAL TIP RULER LAB (MISCELLANEOUS) ×3 IMPLANT
NEEDLE SUPERTRX PREMARK BIOPSY (NEEDLE) IMPLANT
NS IRRIG 1000ML POUR BTL (IV SOLUTION) ×3 IMPLANT
OIL SILICONE PENTAX (PARTS (SERVICE/REPAIRS)) ×3 IMPLANT
PAD ARMBOARD 7.5X6 YLW CONV (MISCELLANEOUS) ×6 IMPLANT
PATCHES PATIENT (LABEL) ×3 IMPLANT
SYR 20CC LL (SYRINGE) ×3 IMPLANT
SYR 20ML ECCENTRIC (SYRINGE) ×3 IMPLANT
SYR 50ML SLIP (SYRINGE) ×3 IMPLANT
TOWEL OR 17X24 6PK STRL BLUE (TOWEL DISPOSABLE) ×3 IMPLANT
TRAP SPECIMEN MUCOUS 40CC (MISCELLANEOUS) IMPLANT
TUBE CONNECTING 20'X1/4 (TUBING) ×1
TUBE CONNECTING 20X1/4 (TUBING) ×2 IMPLANT

## 2014-03-11 NOTE — Op Note (Signed)
Video Bronchoscopy with Electromagnetic Navigation Procedure Note  Date of Operation: 03/11/2014  Pre-op Diagnosis: LLL nodules  Post-op Diagnosis: same  Surgeon: Baltazar Apo  Assistants: none  Anesthesia: General endotracheal anesthesia  Operation: Flexible video fiberoptic bronchoscopy with electromagnetic navigation and biopsies.  Estimated Blood Loss: Minimal  Complications: none apparent  Indications and History: Charles [name] is a 64 y.o. male with hx PE and LLL nodules. He was admitted to adjust/ hold anticoagulation and to perform bronchoscopy with biopsies.  The risks, benefits, complications, treatment options and expected outcomes were discussed with the patient.  The possibilities of pneumothorax, pneumonia, reaction to medication, pulmonary aspiration, perforation of a viscus, bleeding, failure to diagnose a condition and creating a complication requiring transfusion or operation were discussed with the patient who freely signed the consent.    Description of Procedure: The patient was seen in the Preoperative Area, was examined and was deemed appropriate to proceed.  The patient was taken to OR 10, identified as [name] and the procedure verified as Flexible Video Fiberoptic Bronchoscopy.  A Time Out was held and the above information confirmed.   Prior to the date of the procedure a high-resolution CT scan of the chest was performed. Utilizing [place] a virtual tracheobronchial tree was generated to allow the creation of distinct navigation pathways to the patient's LLL parenchymal abnormalities. After being taken to the operating room general anesthesia was initiated and the patient  was orally intubated. The video fiberoptic bronchoscope was introduced via the endotracheal tube and a general inspection was performed which showed normal airways throughout. The extendable working channel and locator guide were introduced into the bronchoscope. The  distinct navigation pathways prepared prior to this procedure were then utilized to navigate to within 0.5 - 1.0cm  of patient's LLL nodule identified on CT scan. The extendable working channel was secured into place and the locator guide was withdrawn. Under fluoroscopic guidance transbronchial needle brushings, transbronchial Wang needle biopsies, and transbronchial forceps biopsies were performed to be sent for cytology and pathology. A bronchioalveolar lavage was performed in the LLL and sent for cytology. At the end of the procedure a general airway inspection was performed and there was no evidence of active bleeding. The bronchoscope was removed.  The patient tolerated the procedure well. There was no significant blood loss and there were no obvious complications. A post-procedural chest x-ray is pending.  Samples: 1. Transbronchial needle brushings from LLL 2. Transbronchial Wang needle biopsies from LLL 3. Transbronchial forceps biopsies from LLL 4. Bronchoalveolar lavage from LLL  Plans:  A CXR will be reviewed and the patient will be restarted on heparin 1/20 pm. We will plan to restart his xarelto and discharge the patient on 1/21. We will review the cytology, pathology results with the patient when they become available. Outpatient followup will be with Dr Lamonte Sakai.    Baltazar Apo, MD, PhD 03/11/2014, 10:07 AM [place] [facility] [phone] or if no answer [phone]

## 2014-03-11 NOTE — Progress Notes (Signed)
ANTICOAGULATION CONSULT NOTE - Follow Up Consult  Pharmacy Consult for Heparin  Indication: pulmonary embolus  No Known Allergies  Patient Measurements: Height: 6\' 4"  (193 cm) Weight: 152 lb 3.2 oz (69.037 kg) IBW/kg (Calculated) : 86.8   Vital Signs: Temp: 98 F (36.7 C) (01/20 2214) Temp Source: Oral (01/20 2214) BP: 125/72 mmHg (01/20 2214) Pulse Rate: 66 (01/20 2214)  Labs:  Recent Labs  03/09/14 1700 03/10/14 0412  03/10/14 1853 03/11/14 0433 03/11/14 2209  HGB 13.8 13.8  --   --  13.0  --   HCT 39.3 39.5  --   --  37.4*  --   PLT 179 166  --   --  145*  --   APTT 34 60*  < > 106* 35 56*  LABPROT 28.3*  --   --   --   --   --   INR 2.62*  --   --   --   --   --   HEPARINUNFRC  --   --   --  1.14*  --  0.29*  CREATININE 1.41*  --   --   --   --   --   < > = values in this interval not displayed.  Estimated Creatinine Clearance: 53.7 mL/min (by C-G formula based on Cr of 1.41).   Assessment: 56 YOM with hx of PE and LLL nodules.  Patient was on Xarelto 15 mg BID since 12/30 and was supposed to transition to 20 mg daily on 03/11/14. Xarelto held for bronchoscopy with biopsies which were performed this afternoon. Plan to resume Xarelto on discharge tomorrow morning.   aPTT 56 sec and heparin level 0.29 (subtherapeutic) (appears that Xarelto affect on heparin level has worn off as heparin level and aPTT are correlating; therefore will use heparin levels for any further monitoring)  No bleeding noted.  Goal of Therapy:  Heparin level 0.3-0.7 units/ml  Monitor platelets by anticoagulation protocol: Yes   Plan:  -Increase heparin infusion to 1200 units/hr -F/u a.m. HL  -Plan to resume Xarelto 20 mg daily 1/21 a.m. and stop heparin at that point  -Monitor CBC and s/s of bleeding   Sherlon Handing, PharmD, BCPS Clinical pharmacist, pager [phone] 03/11/2014 10:48 PM

## 2014-03-11 NOTE — Progress Notes (Signed)
Report called to Anesthesia [phone]

## 2014-03-11 NOTE — Transfer of Care (Signed)
Immediate Anesthesia Transfer of Care Note  Patient: Charles [name]  Procedure(s) Performed: Procedure(s): VIDEO BRONCHOSCOPY WITH ENDOBRONCHIAL NAVIGATION (N/A)  Patient Location: PACU  Anesthesia Type:General  Level of Consciousness: awake, alert  and oriented  Airway & Oxygen Therapy: Patient Spontanous Breathing and Patient connected to nasal cannula oxygen  Post-op Assessment: Report given to PACU RN and Post -op Vital signs reviewed and stable  Post vital signs: Reviewed and stable  Complications: No apparent anesthesia complications

## 2014-03-11 NOTE — H&P (Signed)
  Name:Charles [name] KDX:[id number] DOB:1951-01-14   ADMISSION DATE: 03/09/2014 CONSULTATION DATE: 1/18   CHIEF COMPLAINT: Pulmonary Nodule  BRIEF PATIENT DESCRIPTION:  64 year old former cigarette smoker, THC smoker, PVD, suspected COPD, and recent CT chest 12/30 showed R sided PE and a LLL spiculated nodule with 2 other smaller associated nodules concerning for malignancy. LE dopplers show LLE femoral DVT. He was started on Xarelto and then sent to our office given concern for malignancy. He was seen by Dr Lamonte Sakai who evaluated his films. He is now being admitted for ENB for tissue bx.   SIGNIFICANT EVENTS   STUDIES:  CT chest 12/20: 1. Acute RIGHT middle and RIGHT lower lobe pulmonary embolus with lateral RIGHT middle lobe pulmonary infarct.2. Three spiculated LEFT lung pulmonary nodules with the largest in the superior segment of the LEFT lower lobe compatible with bronchogenic carcinoma. 3. Centrilobular and paraseptal emphysema.  SUBJECTIVE:  Heparin stopped at 0200 this am No new issues, no dyspnea, CP or bleeding   Filed Vitals:   03/10/14 0554 03/10/14 1505 03/10/14 2128 03/11/14 0635  BP: 152/88 118/80 135/87 128/97  Pulse: 101 78 73 85  Temp: 97.7 F (36.5 C) 98.5 F (36.9 C) 98.2 F (36.8 C) 97.9 F (36.6 C)  TempSrc: Oral Oral Oral Oral  Resp: 17 18 18 18   Height:      Weight:    69.037 kg (152 lb 3.2 oz)  SpO2: 100% 100% 100% 100%     PHYSICAL EXAMINATION: General: 64 year old male, in no acute distress.  Neuro: Awake, oriented, no focal def  HEENT: [place], no JVD, poor dentition  Cardiovascular: rrr Lungs: CTA Abdomen: Soft, non-tender  Musculoskeletal: Intact  Skin: Intact    Recent Labs Lab 03/09/14 1700 03/10/14 0412 03/11/14 0433  HGB 13.8 13.8 13.0  HCT 39.3 39.5 37.4*  WBC 7.2 7.4 6.4  PLT 179 166 145*    Recent Labs Lab 03/09/14 1700  NA 139  K 4.3  CL 105  CO2 23  GLUCOSE 99  BUN 18  CREATININE  1.41*  CALCIUM 8.7  MG 2.1  PHOS 3.8    Recent Labs Lab 03/09/14 1700  INR 2.62*      ASSESSMENT / PLAN:  Left Spiculated Lung nodules w/ largest in superior seg of LLL.  Plan For ENB and bx's today 1/20 NPO after midnight 1/20 at 0200  Recent PE/DVT. On xarelto since 12/30 Plan Xarelto on hold.  Restart heparin approx 6 hours after the procedure Keep him here o/n to insure no bleeding and then restart xarelto in the am 1/21  HTN Plan  cont home rx   Baltazar Apo, MD, PhD 03/11/2014, 8:19 AM [place] [facility] [phone] or if no answer [phone]

## 2014-03-11 NOTE — Anesthesia Postprocedure Evaluation (Signed)
Anesthesia Post Note  Patient: Charles [name]  Procedure(s) Performed: Procedure(s) (LRB): VIDEO BRONCHOSCOPY WITH ENDOBRONCHIAL NAVIGATION (N/A)  Anesthesia type: general  Patient location: PACU  Post pain: Pain level controlled  Post assessment: Patient's Cardiovascular Status Stable  Last Vitals:  Filed Vitals:   03/11/14 1006  BP: 126/82  Pulse: 91  Temp: 36.4 C  Resp: 23    Post vital signs: Reviewed and stable  Level of consciousness: sedated  Complications: No apparent anesthesia complications

## 2014-03-11 NOTE — Progress Notes (Signed)
ANTICOAGULATION CONSULT NOTE - Follow Up Consult  Pharmacy Consult for Heparin  Indication: pulmonary embolus  No Known Allergies  Patient Measurements: Height: 6\' 4"  (193 cm) Weight: 152 lb 3.2 oz (69.037 kg) IBW/kg (Calculated) : 86.8   Vital Signs: Temp: 97.8 F (36.6 C) (01/20 1126) Temp Source: Oral (01/20 1126) BP: 118/69 mmHg (01/20 1126) Pulse Rate: 61 (01/20 1126)  Labs:  Recent Labs  03/09/14 1700 03/10/14 0412 03/10/14 1211 03/10/14 1853 03/11/14 0433  HGB 13.8 13.8  --   --  13.0  HCT 39.3 39.5  --   --  37.4*  PLT 179 166  --   --  145*  APTT 34 60* 108* 106* 35  LABPROT 28.3*  --   --   --   --   INR 2.62*  --   --   --   --   HEPARINUNFRC  --   --   --  1.14*  --   CREATININE 1.41*  --   --   --   --     Estimated Creatinine Clearance: 53.7 mL/min (by C-G formula based on Cr of 1.41).   Medical History: Past Medical History  Diagnosis Date  . ABNORMAL ELECTROCARDIOGRAM 11/02/2008  . ABSCESS, LUNG 10/23/2006  . CHEST PAIN 12/24/2009  . Cramp of limb 07/19/2007  . DIVERTICULOSIS, COLON 03/07/2007  . 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/23/2006  . FLANK PAIN, LEFT 02/25/2010  . FREQUENCY, URINARY 12/24/2009  . HEMORRHOIDS 10/23/2006  . HYPERLIPIDEMIA 10/23/2006  . HYPERTENSION 10/23/2006  . PLANTAR FASCIITIS, LEFT 11/02/2008  . PSA, INCREASED 11/20/2007  . PULMONARY NODULE 05/14/2008  . Unspecified Peripheral Vascular Disease 10/23/2006  . BPH (benign prostatic hyperplasia) 11/22/2010  . DVT (deep venous thrombosis) 01/2014    LLE  . PE (pulmonary embolism) 02/2014  . Emphysema of lung   . BACTERIAL PNEUMONIA 12/28/2009  . Pneumonia 12/2013 X 2    Medications:  Prescriptions prior to admission  Medication Sig Dispense Refill Last Dose  . amLODipine (NORVASC) 10 MG tablet TAKE 1 TABLET (10 MG TOTAL) BY MOUTH DAILY. 90 tablet 3 03/09/2014 at Unknown time  . aspirin 325 MG EC tablet Take 325 mg by mouth daily.     03/09/2014 at Unknown time  . CRESTOR 20 MG tablet TAKE 1  TABLET (20 MG TOTAL) BY MOUTH DAILY. 90 tablet 3 03/09/2014 at Unknown time  . lisinopril (PRINIVIL,ZESTRIL) 20 MG tablet TAKE 1 TABLET (20 MG TOTAL) BY MOUTH DAILY. 90 tablet 3 03/09/2014 at Unknown time  . Rivaroxaban (XARELTO STARTER PACK) 15 & 20 MG TBPK Take as directed on package: Start with one 15mg  tablet by mouth twice a day with food. On Day 22, switch to one 20mg  tablet once a day with food. 51 each 0 03/09/2014 at Unknown time  . VIAGRA 100 MG tablet TAKE 1 TABLET (100 MG TOTAL) BY MOUTH DAILY AS NEEDED. 10 tablet 3 Past Week at Unknown time  . ZETIA 10 MG tablet TAKE 1 TABLET (10 MG TOTAL) BY MOUTH DAILY. 90 tablet 3 03/09/2014 at Unknown time  . fluticasone (FLONASE) 50 MCG/ACT nasal spray Place 2 sprays into both nostrils daily. (Patient not taking: Reported on 02/23/2014) 16 g 3 Not Taking  . meloxicam (MOBIC) 15 MG tablet Take 1 tablet (15 mg total) by mouth daily. (Patient not taking: Reported on 02/23/2014) 30 tablet 0 Not Taking  . rivaroxaban (XARELTO) 20 MG TABS tablet Take 1 tablet (20 mg total) by mouth daily with supper. To begin  after finish xarelto starter pack (Patient taking differently: Take 20 mg by mouth daily with supper. To begin after finish xarelto starter pack - on 03/11/14) 30 tablet 5 Taking  . traMADol (ULTRAM) 50 MG tablet Take 1 tablet (50 mg total) by mouth every 6 (six) hours as needed. (Patient not taking: Reported on 02/23/2014) 60 tablet 0 Not Taking    Assessment: 55 YOM with hx of PE and LLL nodules. He was admitted to hold Xarelto to perform bronchoscopy with biopsies which were performed this afternoon. Pharmacy consulted to start heparin today at 1600 with plan to resume Xarelto on discharge tomorrow morning. Patient was on Xarelto 15 mg BID since 12/30 and was supposed to transition to 20 mg daily on 03/11/14.   Goal of Therapy:  Heparin level 0.3-0.7 units/ml  APTT 66-102  Monitor platelets by anticoagulation protocol: Yes   Plan:  -Resume heparin  infusion at earlier rate of 1050 units/hr at 4 PM. No bolus  -F/u 6 hr HL and aPTT. If levels correlate, may d/c aPTT and monitor on HL alone  -Resume Xarelto 20 mg daily tomorrow morning and stop heparin at that point  -Monitor CBC and s/s of bleeding   Albertina Parr, PharmD., BCPS Clinical Pharmacist Pager [phone]

## 2014-03-11 NOTE — Anesthesia Preprocedure Evaluation (Signed)
Anesthesia Evaluation  Patient identified by MRN, date of birth, ID band Patient awake    Reviewed: Allergy & Precautions, NPO status , Patient's Chart, lab work & pertinent test results  Airway Mallampati: I  TM Distance: >3 FB Neck ROM: Full    Dental   Pulmonary former smoker,          Cardiovascular hypertension, Pt. on medications     Neuro/Psych    GI/Hepatic   Endo/Other    Renal/GU      Musculoskeletal   Abdominal   Peds  Hematology   Anesthesia Other Findings   Reproductive/Obstetrics                             Anesthesia Physical Anesthesia Plan  ASA: III  Anesthesia Plan: General   Post-op Pain Management:    Induction: Intravenous  Airway Management Planned: Oral ETT  Additional Equipment:   Intra-op Plan:   Post-operative Plan: Extubation in OR  Informed Consent: I have reviewed the patients History and Physical, chart, labs and discussed the procedure including the risks, benefits and alternatives for the proposed anesthesia with the patient or authorized representative who has indicated his/her understanding and acceptance.     Plan Discussed with: CRNA and Surgeon  Anesthesia Plan Comments:         Anesthesia Quick Evaluation

## 2014-03-12 ENCOUNTER — Encounter (HOSPITAL_COMMUNITY): Payer: Self-pay | Admitting: Emergency Medicine

## 2014-03-12 ENCOUNTER — Inpatient Hospital Stay (HOSPITAL_COMMUNITY): Admission: AD | Admit: 2014-03-12 | Payer: 59 | Source: Intra-hospital | Admitting: Physical Medicine & Rehabilitation

## 2014-03-12 DIAGNOSIS — I2699 Other pulmonary embolism without acute cor pulmonale: Secondary | ICD-10-CM

## 2014-03-12 LAB — HEPARIN LEVEL (UNFRACTIONATED): Heparin Unfractionated: 0.64 IU/mL (ref 0.30–0.70)

## 2014-03-12 LAB — CBC
HEMATOCRIT: 33.8 % — AB (ref 39.0–52.0)
Hemoglobin: 11.6 g/dL — ABNORMAL LOW (ref 13.0–17.0)
MCH: 32.9 pg (ref 26.0–34.0)
MCHC: 34.3 g/dL (ref 30.0–36.0)
MCV: 95.8 fL (ref 78.0–100.0)
Platelets: 121 10*3/uL — ABNORMAL LOW (ref 150–400)
RBC: 3.53 MIL/uL — ABNORMAL LOW (ref 4.22–5.81)
RDW: 13.9 % (ref 11.5–15.5)
WBC: 7.6 10*3/uL (ref 4.0–10.5)

## 2014-03-12 MED ORDER — RIVAROXABAN 20 MG PO TABS
20.0000 mg | ORAL_TABLET | Freq: Every day | ORAL | Status: DC
Start: 2014-03-12 — End: 2014-09-15

## 2014-03-12 MED ORDER — RIVAROXABAN 20 MG PO TABS
20.0000 mg | ORAL_TABLET | Freq: Every day | ORAL | Status: DC
  Administered 2014-03-12: 20 mg via ORAL
  Filled 2014-03-12: qty 1

## 2014-03-12 NOTE — Discharge Summary (Signed)
Physician Discharge Summary       Patient ID: Charles [name] MRN: [id number] DOB/AGE: [birth year] 64 y.o.  Admit date: 03/09/2014 Discharge date: 03/12/2014  Discharge Diagnoses:   Left Spiculated lung nodules w/ largest in superior segment of left lower lobe Recent PE/DVT  HTN Detailed Hospital Course:   64 year old former cigarette smoker, THC smoker, PVD, suspected COPD, and recent CT chest 12/30 showed R sided PE and a LLL spiculated nodule with 2 other smaller associated nodules concerning for malignancy. LE dopplers show LLE femoral DVT. He was started on Xarelto and then sent to our office given concern for malignancy. He was seen by Dr Lamonte Sakai who evaluated his films. He was admitted for ENB for tissue bx which was completed on 1/20. Initial pathology is negative however brushings and cytology are pending. He was monitored overnight for evidence of bleeding. Felt ready for d/c as of 1/21 with the plan as outlined below.     Discharge Plan by active problems  Left Spiculated Lung nodules w/ largest in superior seg of LLL.  Plan F/u results w/ Treshaun Carrico Has f/u w/ Parrett next week   Recent PE/DVT. On xarelto since 12/30 Plan Resume Xarelto at 20mg /d  HTN Plan  cont home rx     Significant Hospital tests/ studies   120: underwent 1. Transbronchial needle brushings from LLL 2. Transbronchial Wang needle biopsies from LLL 3. Transbronchial forceps biopsies from LLL 4. Bronchoalveolar lavage from LLL  Discharge Exam: BP 121/70 mmHg  Pulse 87  Temp(Src) 98.3 F (36.8 C) (Oral)  Resp 18  Ht 6\' 4"  (1.93 m)  Wt 72.167 kg (159 lb 1.6 oz)  BMI 19.37 kg/m2  SpO2 100% Room air   General: 64 year old male, in no acute distress.  Neuro: Awake, oriented, no focal def  HEENT: NCAT  Cardiovascular: rrr Lungs: CTA Abdomen: Soft, non-tender  Musculoskeletal: Intact  Skin: Intact   Labs at discharge Lab Results  Component Value Date   CREATININE 1.41*  03/09/2014   BUN 18 03/09/2014   NA 139 03/09/2014   K 4.3 03/09/2014   CL 105 03/09/2014   CO2 23 03/09/2014   Lab Results  Component Value Date   WBC 7.6 03/12/2014   HGB 11.6* 03/12/2014   HCT 33.8* 03/12/2014   MCV 95.8 03/12/2014   PLT 121* 03/12/2014   Lab Results  Component Value Date   ALT 61* 03/09/2014   AST 88* 03/09/2014   ALKPHOS 53 03/09/2014   BILITOT 0.9 03/09/2014   Lab Results  Component Value Date   INR 2.62* 03/09/2014    Current radiology studies Dg Chest Port 1 View  03/11/2014   CLINICAL DATA:  64 year old male status post left lower lobe biopsies and bronchoscopy. Initial encounter.  EXAM: PORTABLE CHEST - 1 VIEW  COMPARISON:  Chest CT 02/18/2014 and earlier.  FINDINGS: Portable AP semi upright view at 1025 hrs. Stable lung volumes. No pneumothorax or pleural effusion. Mildly increased left perihilar streaky opacity. Stable cardiac size and mediastinal contours. Visualized tracheal air column is within normal limits. No new confluent pulmonary opacity.  IMPRESSION: No pneumothorax or adverse features identified status post left lung bronchoscopy and biopsy.   Electronically Signed   By: Lars Pinks M.D.   On: 03/11/2014 10:47   Dg C-arm Bronchoscopy  03/11/2014   CLINICAL DATA:    C-ARM BRONCHOSCOPY  Fluoroscopy was utilized by the requesting physician.  No radiographic  interpretation.     Disposition:  Final discharge disposition  not confirmed      Discharge Instructions    Diet - low sodium heart healthy    Complete by:  As directed      Increase activity slowly    Complete by:  As directed             Medication List    TAKE these medications        amLODipine 10 MG tablet  Commonly known as:  NORVASC  TAKE 1 TABLET (10 MG TOTAL) BY MOUTH DAILY.     aspirin 325 MG EC tablet  Take 325 mg by mouth daily.     CRESTOR 20 MG tablet  Generic drug:  rosuvastatin  TAKE 1 TABLET (20 MG TOTAL) BY MOUTH DAILY.     fluticasone 50 MCG/ACT  nasal spray  Commonly known as:  FLONASE  Place 2 sprays into both nostrils daily.     lisinopril 20 MG tablet  Commonly known as:  PRINIVIL,ZESTRIL  TAKE 1 TABLET (20 MG TOTAL) BY MOUTH DAILY.     meloxicam 15 MG tablet  Commonly known as:  MOBIC  Take 1 tablet (15 mg total) by mouth daily.     rivaroxaban 20 MG Tabs tablet  Commonly known as:  XARELTO  Take 1 tablet (20 mg total) by mouth daily.     traMADol 50 MG tablet  Commonly known as:  ULTRAM  Take 1 tablet (50 mg total) by mouth every 6 (six) hours as needed.     VIAGRA 100 MG tablet  Generic drug:  sildenafil  TAKE 1 TABLET (100 MG TOTAL) BY MOUTH DAILY AS NEEDED.     ZETIA 10 MG tablet  Generic drug:  ezetimibe  TAKE 1 TABLET (10 MG TOTAL) BY MOUTH DAILY.       Follow-up Information    Follow up with PARRETT,TAMMY, NP On 03/19/2014.   Specialty:  Nurse Practitioner   Why:  4pm   Contact information:   [address] [address] [phone]       Discharged Condition: good  Physician Statement:   The Patient was personally examined, the discharge assessment and plan has been personally reviewed and I agree with ACNP Babcock's assessment and plan. > 30 minutes of time have been dedicated to discharge assessment, planning and discharge instructions.   SignedMarni Griffon 03/12/2014, 11:09 AM  Baltazar Apo, MD, PhD 03/12/2014, 2:28 PM [facility] [phone] or if no answer [phone]

## 2014-03-12 NOTE — Progress Notes (Signed)
Pt heparin drip stopped as ordered. Francis Gaines Diavian Furgason RN.

## 2014-03-12 NOTE — Progress Notes (Addendum)
[facility] for heparin to xarelto Indication: pulmonary embolus  No Known Allergies  Patient Measurements: Height: 6\' 4"  (193 cm) Weight: 159 lb 1.6 oz (72.167 kg) IBW/kg (Calculated) : 86.8 Heparin Dosing Weight: 72.6 kg  Vital Signs: Temp: 97.7 F (36.5 C) (01/21 0535) Temp Source: Oral (01/21 0535) BP: 115/72 mmHg (01/21 0535) Pulse Rate: 99 (01/21 0535)  Labs:  Recent Labs  03/09/14 1700 03/10/14 0412  03/10/14 1853 03/11/14 0433 03/11/14 2209 03/12/14 0500 03/12/14 0701  HGB 13.8 13.8  --   --  13.0  --  11.6*  --   HCT 39.3 39.5  --   --  37.4*  --  33.8*  --   PLT 179 166  --   --  145*  --  121*  --   APTT 34 60*  < > 106* 35 56*  --   --   LABPROT 28.3*  --   --   --   --   --   --   --   INR 2.62*  --   --   --   --   --   --   --   HEPARINUNFRC  --   --   --  1.14*  --  0.29*  --  0.64  CREATININE 1.41*  --   --   --   --   --   --   --   < > = values in this interval not displayed.  Estimated Creatinine Clearance: 56.2 mL/min (by C-G formula based on Cr of 1.41).   Medical History: Past Medical History  Diagnosis Date  . ABNORMAL ELECTROCARDIOGRAM 11/02/2008  . ABSCESS, LUNG 10/23/2006  . CHEST PAIN 12/24/2009  . Cramp of limb 07/19/2007  . DIVERTICULOSIS, COLON 03/07/2007  . 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/23/2006  . FLANK PAIN, LEFT 02/25/2010  . FREQUENCY, URINARY 12/24/2009  . HEMORRHOIDS 10/23/2006  . HYPERLIPIDEMIA 10/23/2006  . HYPERTENSION 10/23/2006  . PLANTAR FASCIITIS, LEFT 11/02/2008  . PSA, INCREASED 11/20/2007  . PULMONARY NODULE 05/14/2008  . Unspecified Peripheral Vascular Disease 10/23/2006  . BPH (benign prostatic hyperplasia) 11/22/2010  . DVT (deep venous thrombosis) 01/2014    LLE  . PE (pulmonary embolism) 02/2014  . Emphysema of lung   . BACTERIAL PNEUMONIA 12/28/2009  . Pneumonia 12/2013 X 2    Medications:  See EMR  Assessment: 64 yo male s/p lung biopsy yesterday. Pt is on Xarelto PTA for PE in Dec  2015. Pt was taken off Xarelto and bridged with heparin in anticipation of lung biopsy.  Xarelto will be resumed this morning.  Pt was supposed to switch from 15 mg po BID to 20 mg po daily on 1/20 - will therefore start 20 mg daily today. Hgb 11.6, plts 121. Heparin level was therapeutic this morning.   Goal of Therapy:  Heparin level 0.3-0.7 units/ml Monitor platelets by anticoagulation protocol: Yes   Plan:  -Stop heparin and give Xarelto at the same time -Xarelto 20 mg po daily -Monitor for s/sx bleeding   Hughes Better, PharmD, BCPS Clinical Pharmacist Pager: [phone] 03/12/2014 9:41 AM

## 2014-03-12 NOTE — Progress Notes (Signed)
Pt discharge education and instructions completed with pt and wife at bedside. All voices understanding and denies any questions. Pt IV removed; pt discharge home with wife to transport him home. Pt transported off unit via wheelchair with spouse and belongings to the side. Francis Gaines Aruna Nestler RN.

## 2014-03-16 ENCOUNTER — Telehealth: Payer: Self-pay | Admitting: Emergency Medicine

## 2014-03-16 NOTE — Telephone Encounter (Signed)
Bx's and cytology are non-diagnostic. One cytology did show atypical cells but no other findings. The percepta test has been sent and is pending. I explained the resu,lts to him, also the possibility of a false negative. He has f/u with T Parrett 03/30/14, percepta should be back by then. He will likely need repeat bx given our suspicion. Will discuss with oncology, TCTS and decide best course

## 2014-03-19 ENCOUNTER — Ambulatory Visit (INDEPENDENT_AMBULATORY_CARE_PROVIDER_SITE_OTHER): Payer: 59 | Admitting: Adult Health

## 2014-03-19 ENCOUNTER — Encounter: Payer: Self-pay | Admitting: Adult Health

## 2014-03-19 VITALS — BP 132/74 | HR 101 | Temp 98.0°F | Ht 76.0 in | Wt 166.0 lb

## 2014-03-19 DIAGNOSIS — R911 Solitary pulmonary nodule: Secondary | ICD-10-CM

## 2014-03-19 DIAGNOSIS — I2699 Other pulmonary embolism without acute cor pulmonale: Secondary | ICD-10-CM

## 2014-03-19 NOTE — Patient Instructions (Signed)
Delsym 2 tsp Twice daily  As needed cough .  follow up Dr. Lamonte Sakai  As planned on 03/30/14 as planned  Please contact office for sooner follow up if symptoms do not improve or worsen or seek emergency care

## 2014-03-30 ENCOUNTER — Ambulatory Visit (INDEPENDENT_AMBULATORY_CARE_PROVIDER_SITE_OTHER): Payer: 59 | Admitting: Emergency Medicine

## 2014-03-30 ENCOUNTER — Encounter: Payer: Self-pay | Admitting: Emergency Medicine

## 2014-03-30 ENCOUNTER — Encounter: Payer: Self-pay | Admitting: *Deleted

## 2014-03-30 VITALS — BP 124/74 | HR 79 | Ht 76.0 in | Wt 159.0 lb

## 2014-03-30 DIAGNOSIS — R911 Solitary pulmonary nodule: Secondary | ICD-10-CM | POA: Diagnosis not present

## 2014-03-30 NOTE — Assessment & Plan Note (Signed)
LLL nodule that was bx negative Given suspicion I have recommended that he have a needle bx, will arrange this.  Will need to hold xarelto 2 days before procedure,

## 2014-03-30 NOTE — Progress Notes (Signed)
Subjective:    Patient ID: Charles [name], male    DOB: [birth year], 64 y.o.   MRN: [id number]  HPI 64 year old former cigarette smoker, THC smoker, PVD, suspected COPD. He began to have some L CP,  Minimal cough in October. CXR 11/5, 12/10 showed right upper lobe infiltrate. He was treated with antibiotics with some resolution of his symptoms. A CT chest 12/30 showed R sided PE and a L LL spiculated nodule with 2 other smaller associated nodules concerning for malignancy. LE dopplers show LLE femoral DVT. He is referred for evaluation of his abnormal CT scan.   ROV 03/30/14 -- follows up for discussion of his bx results. There were atypical cells present on a single sample, all else negative. We discussed today the pros cons of repeat bx.    Review of Systems  Constitutional: Positive for appetite change and unexpected weight change. Negative for fever.  HENT: Positive for congestion and ear pain. Negative for dental problem, nosebleeds, postnasal drip, rhinorrhea, sinus pressure, sneezing, sore throat and trouble swallowing.   Eyes: Negative for redness and itching.  Respiratory: Positive for cough and shortness of breath. Negative for chest tightness and wheezing.   Cardiovascular: Negative for palpitations and leg swelling.  Gastrointestinal: Negative for nausea and vomiting.  Genitourinary: Negative for dysuria.  Musculoskeletal: Negative for joint swelling.  Skin: Negative for rash.  Neurological: Negative for headaches.  Hematological: Does not bruise/bleed easily.  Psychiatric/Behavioral: Negative for dysphoric mood. The patient is not nervous/anxious.     Past Medical History  Diagnosis Date  . ABNORMAL ELECTROCARDIOGRAM 11/02/2008  . ABSCESS, LUNG 10/23/2006  . CHEST PAIN 12/24/2009  . Cramp of limb 07/19/2007  . DIVERTICULOSIS, COLON 03/07/2007  . 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/23/2006  . FLANK PAIN, LEFT 02/25/2010  . FREQUENCY, URINARY 12/24/2009  . HEMORRHOIDS 10/23/2006  .  HYPERLIPIDEMIA 10/23/2006  . HYPERTENSION 10/23/2006  . PLANTAR FASCIITIS, LEFT 11/02/2008  . PSA, INCREASED 11/20/2007  . PULMONARY NODULE 05/14/2008  . Unspecified Peripheral Vascular Disease 10/23/2006  . BPH (benign prostatic hyperplasia) 11/22/2010  . DVT (deep venous thrombosis) 01/2014    LLE  . PE (pulmonary embolism) 02/2014  . Emphysema of lung   . BACTERIAL PNEUMONIA 12/28/2009  . Pneumonia 12/2013 X 2     Family History  Problem Relation Age of Onset  . Kidney disease Sister     Renal transplant  . Heart disease Mother   . Heart disease Father      History   Social History  . Marital Status: Married    Spouse Name: N/A    Number of Children: N/A  . Years of Education: N/A   Occupational History  . Not on file.   Social History Main Topics  . Smoking status: Former Smoker -- 1.00 packs/day for 15 years    Types: Cigarettes  . Smokeless tobacco: Never Used     Comment: "quit smoking cigarettes in the 1990's"  . Alcohol Use: 2.4 oz/week    4 Shots of liquor, 0 Not specified per week     Comment: 03/09/2014 "3-4 mixed drinks 3-4 times/wk"  . Drug Use: Yes    Special: Marijuana     Comment: 03/09/2014 "smoke marijuana q couple days"  . Sexual Activity: Yes   Other Topics Concern  . Not on file   Social History Narrative     No Known Allergies   Outpatient Prescriptions Prior to Visit  Medication Sig Dispense Refill  . amLODipine (NORVASC) 10 MG tablet TAKE  1 TABLET (10 MG TOTAL) BY MOUTH DAILY. 90 tablet 3  . aspirin 325 MG EC tablet Take 325 mg by mouth daily.      . CRESTOR 20 MG tablet TAKE 1 TABLET (20 MG TOTAL) BY MOUTH DAILY. 90 tablet 3  . lisinopril (PRINIVIL,ZESTRIL) 20 MG tablet TAKE 1 TABLET (20 MG TOTAL) BY MOUTH DAILY. 90 tablet 3  . rivaroxaban (XARELTO) 20 MG TABS tablet Take 1 tablet (20 mg total) by mouth daily. 30 tablet   . VIAGRA 100 MG tablet TAKE 1 TABLET (100 MG TOTAL) BY MOUTH DAILY AS NEEDED. 10 tablet 3  . ZETIA 10 MG tablet TAKE 1  TABLET (10 MG TOTAL) BY MOUTH DAILY. 90 tablet 3   No facility-administered medications prior to visit.         Objective:   Physical Exam Filed Vitals:   03/30/14 1348  BP: 124/74  Pulse: 79  Height: 6\' 4"  (1.93 m)  Weight: 159 lb (72.122 kg)  SpO2: 98%   Gen: Pleasant, thin, in no distress,  normal affect  ENT: No lesions,  mouth clear,  oropharynx clear, no postnasal drip  Neck: No JVD, no TMG, no carotid bruits  Lungs: No use of accessory muscles, clear without rales or rhonchi  Cardiovascular: RRR, heart sounds normal, no murmur or gallops, no peripheral edema  Musculoskeletal: No deformities, no cyanosis or clubbing  Neuro: alert, non focal  Skin: Warm, no lesions or rashes      Assessment & Plan:  Solitary pulmonary nodule LLL nodule that was bx negative Given suspicion I have recommended that he have a needle bx, will arrange this.  Will need to hold xarelto 2 days before procedure,

## 2014-03-30 NOTE — Patient Instructions (Signed)
We will arrange to perform a needle biopsy of your Left lung nodule.  We will stop your Xarelto 2 days before your lung biopsy.  Follow with Dr Lamonte Sakai in 2 months or sooner if you have any problems.

## 2014-03-31 ENCOUNTER — Other Ambulatory Visit: Payer: Self-pay | Admitting: Emergency Medicine

## 2014-03-31 DIAGNOSIS — R911 Solitary pulmonary nodule: Secondary | ICD-10-CM

## 2014-04-01 ENCOUNTER — Ambulatory Visit (INDEPENDENT_AMBULATORY_CARE_PROVIDER_SITE_OTHER)
Admission: RE | Admit: 2014-04-01 | Discharge: 2014-04-01 | Disposition: A | Discharge status: Home / Self Care | Payer: 59 | Source: Ambulatory Visit | Attending: Emergency Medicine | Admitting: Emergency Medicine

## 2014-04-01 ENCOUNTER — Inpatient Hospital Stay: Admission: RE | Admit: 2014-04-01 | Payer: 59 | Source: Ambulatory Visit

## 2014-04-01 DIAGNOSIS — R911 Solitary pulmonary nodule: Secondary | ICD-10-CM

## 2014-04-03 NOTE — Assessment & Plan Note (Signed)
Cont on Xarelto  

## 2014-04-03 NOTE — Assessment & Plan Note (Signed)
CXR 11/5, 12/10 showed right upper lobe infiltrate. He was treated with antibiotics with some resolution of his symptoms. A CT chest 12/30 showed R sided PE and a L LL spiculated nodule with 2 other smaller associated nodules concerning for malignancy. LE dopplers show LLE femoral DVT Recent ENB with nondiagnostic path/cytology  He will need to consider repeat bx and will dicuss on return ov with Dr. Lamonte Sakai  Next week as pt is former smoker.

## 2014-04-03 NOTE — Progress Notes (Signed)
   Subjective:    Patient ID: Charles [name], male    DOB: Apr 02, 1950, 64 y.o.   MRN: [id number]  HPI 64 year old former cigarette smoker, THC smoker, PVD, suspected COPD. CXR 11/5, 12/10 showed right upper lobe infiltrate. He was treated with antibiotics with some resolution of his symptoms. A CT chest 12/30 showed R sided PE and a L LL spiculated nodule with 2 other smaller associated nodules concerning for malignancy. LE dopplers show LLE femoral DVT  03/19/14 [facility] follow up  Patient returns for a post hospital follow-up. He was admitted January 18 of January 21 for ENB for tissue bx for left lung nodules.  Cytology/Pathology was nondiagnostic with only atypical cells noted. . We discussed these results.   has had no trouble since discharge. No hemopytsis , chest pian or fever    Review of Systems .Constitutional:   No  weight loss, night sweats,  Fevers, chills,  +fatigue, or  lassitude.  HEENT:   No headaches,  Difficulty swallowing,  Tooth/dental problems, or  Sore throat,                No sneezing, itching, ear ache, nasal congestion, post nasal drip,   CV:  No chest pain,  Orthopnea, PND, swelling in lower extremities, anasarca, dizziness, palpitations, syncope.   GI  No heartburn, indigestion, abdominal pain, nausea, vomiting, diarrhea, change in bowel habits, loss of appetite, bloody stools.   Resp:   No chest wall deformity  Skin: no rash or lesions.  GU: no dysuria, change in color of urine, no urgency or frequency.  No flank pain, no hematuria   MS:  No joint pain or swelling.  No decreased range of motion.  No back pain.  Psych:  No change in mood or affect. No depression or anxiety.  No memory loss.         Objective:   Physical Exam GEN: A/Ox3; pleasant , NAD  HEENT:  [place]/AT,  EACs-clear, TMs-wnl, NOSE-clear, THROAT-clear, no lesions, no postnasal drip or exudate noted.   NECK:  Supple w/ fair ROM; no JVD; normal carotid impulses w/o bruits; no  thyromegaly or nodules palpated; no lymphadenopathy.  RESP  Clear  P & A; w/o, wheezes/ rales/ or rhonchi.no accessory muscle use, no dullness to percussion  CARD:  RRR, no m/r/g  , no peripheral edema, pulses intact, no cyanosis or clubbing.  GI:   Soft & nt; nml bowel sounds; no organomegaly or masses detected.  Musco: Warm bil, no deformities or joint swelling noted.   Neuro: alert, no focal deficits noted.    Skin: Warm, no lesions or rashes         Assessment & Plan:

## 2014-04-20 ENCOUNTER — Encounter (HOSPITAL_COMMUNITY): Payer: Self-pay

## 2014-04-20 ENCOUNTER — Emergency Department (HOSPITAL_COMMUNITY): Payer: No Typology Code available for payment source

## 2014-04-20 ENCOUNTER — Emergency Department (HOSPITAL_COMMUNITY)
Admission: EM | Admit: 2014-04-20 | Discharge: 2014-04-20 | Disposition: A | Discharge status: Home / Self Care | Payer: No Typology Code available for payment source | Attending: Emergency Medicine | Admitting: Emergency Medicine

## 2014-04-20 DIAGNOSIS — Z86718 Personal history of other venous thrombosis and embolism: Secondary | ICD-10-CM | POA: Diagnosis not present

## 2014-04-20 DIAGNOSIS — J439 Emphysema, unspecified: Secondary | ICD-10-CM | POA: Diagnosis not present

## 2014-04-20 DIAGNOSIS — Z87891 Personal history of nicotine dependence: Secondary | ICD-10-CM | POA: Diagnosis not present

## 2014-04-20 DIAGNOSIS — Y9289 Other specified places as the place of occurrence of the external cause: Secondary | ICD-10-CM | POA: Diagnosis not present

## 2014-04-20 DIAGNOSIS — E785 Hyperlipidemia, unspecified: Secondary | ICD-10-CM | POA: Insufficient documentation

## 2014-04-20 DIAGNOSIS — Z7982 Long term (current) use of aspirin: Secondary | ICD-10-CM | POA: Insufficient documentation

## 2014-04-20 DIAGNOSIS — I10 Essential (primary) hypertension: Secondary | ICD-10-CM | POA: Insufficient documentation

## 2014-04-20 DIAGNOSIS — Y998 Other external cause status: Secondary | ICD-10-CM | POA: Insufficient documentation

## 2014-04-20 DIAGNOSIS — H1131 Conjunctival hemorrhage, right eye: Secondary | ICD-10-CM | POA: Insufficient documentation

## 2014-04-20 DIAGNOSIS — N529 Male erectile dysfunction, unspecified: Secondary | ICD-10-CM | POA: Diagnosis not present

## 2014-04-20 DIAGNOSIS — S0990XA Unspecified injury of head, initial encounter: Secondary | ICD-10-CM | POA: Diagnosis present

## 2014-04-20 DIAGNOSIS — Y9389 Activity, other specified: Secondary | ICD-10-CM | POA: Insufficient documentation

## 2014-04-20 DIAGNOSIS — Z7901 Long term (current) use of anticoagulants: Secondary | ICD-10-CM | POA: Diagnosis not present

## 2014-04-20 DIAGNOSIS — Z8719 Personal history of other diseases of the digestive system: Secondary | ICD-10-CM | POA: Diagnosis not present

## 2014-04-20 DIAGNOSIS — W01198A Fall on same level from slipping, tripping and stumbling with subsequent striking against other object, initial encounter: Secondary | ICD-10-CM | POA: Insufficient documentation

## 2014-04-20 DIAGNOSIS — Z86711 Personal history of pulmonary embolism: Secondary | ICD-10-CM | POA: Diagnosis not present

## 2014-04-20 DIAGNOSIS — Z8739 Personal history of other diseases of the musculoskeletal system and connective tissue: Secondary | ICD-10-CM | POA: Insufficient documentation

## 2014-04-20 DIAGNOSIS — S06330A Contusion and laceration of cerebrum, unspecified, without loss of consciousness, initial encounter: Secondary | ICD-10-CM | POA: Insufficient documentation

## 2014-04-20 DIAGNOSIS — S06310A Contusion and laceration of right cerebrum without loss of consciousness, initial encounter: Secondary | ICD-10-CM

## 2014-04-20 DIAGNOSIS — Z79899 Other long term (current) drug therapy: Secondary | ICD-10-CM | POA: Diagnosis not present

## 2014-04-20 DIAGNOSIS — Z8701 Personal history of pneumonia (recurrent): Secondary | ICD-10-CM | POA: Diagnosis not present

## 2014-04-20 DIAGNOSIS — F22 Delusional disorders: Secondary | ICD-10-CM

## 2014-04-20 MED ORDER — GADOBENATE DIMEGLUMINE 529 MG/ML IV SOLN
15.0000 mL | Freq: Once | INTRAVENOUS | Status: AC | PRN
  Administered 2014-04-20: 16 mL via INTRAVENOUS

## 2014-04-20 NOTE — Discharge Instructions (Signed)
As discussed with the report that she follow-up with your primary care physician via telephone.  Please be sure to arrange, with him, CT imaging in 48 hours to ensure that your bruise has not increased in size.  Return here for concerning changes in your condition.

## 2014-04-20 NOTE — ED Provider Notes (Signed)
CSN: [id number]     Arrival date & time 04/20/14  1748 History   First MD Initiated Contact with Patient 04/20/14 1752     Chief Complaint  Patient presents with  . Fall    HPI  Patient on Coumadin presents after minor trauma to the face. Patient states that he did not see a doorframe, accidentally struck his face against this. This happened just prior to arrival. No loss of consciousness, no subsequent confusion, disorientation, visual changes. No asymmetric weakness, speech difficulty. Patient was well prior to the event. Patient is on Coumadin due to history of pulmonary edema.  Past Medical History  Diagnosis Date  . ABNORMAL ELECTROCARDIOGRAM 11/02/2008  . ABSCESS, LUNG 10/23/2006  . CHEST PAIN 12/24/2009  . Cramp of limb 07/19/2007  . DIVERTICULOSIS, COLON 03/07/2007  . 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/23/2006  . FLANK PAIN, LEFT 02/25/2010  . FREQUENCY, URINARY 12/24/2009  . HEMORRHOIDS 10/23/2006  . HYPERLIPIDEMIA 10/23/2006  . HYPERTENSION 10/23/2006  . PLANTAR FASCIITIS, LEFT 11/02/2008  . PSA, INCREASED 11/20/2007  . PULMONARY NODULE 05/14/2008  . Unspecified Peripheral Vascular Disease 10/23/2006  . BPH (benign prostatic hyperplasia) 11/22/2010  . DVT (deep venous thrombosis) 01/2014    LLE  . PE (pulmonary embolism) 02/2014  . Emphysema of lung   . BACTERIAL PNEUMONIA 12/28/2009  . Pneumonia 12/2013 X 2   Past Surgical History  Procedure Laterality Date  . Hemorrhoid surgery  ?1990's  . Inguinal hernia repair Bilateral ?2000's  . Shoulder arthroscopy w/ rotator cuff repair Right 11/2013  . Video bronchoscopy with endobronchial navigation N/A 03/11/2014    Procedure: VIDEO BRONCHOSCOPY WITH ENDOBRONCHIAL NAVIGATION;  Surgeon: Collene Gobble, MD;  Location: [facility] OR;  Service: Thoracic;  Laterality: N/A;   Family History  Problem Relation Age of Onset  . Kidney disease Sister     Renal transplant  . Heart disease Mother   . Heart disease Father    History  Substance Use Topics  .  Smoking status: Former Smoker -- 1.00 packs/day for 15 years    Types: Cigarettes  . Smokeless tobacco: Never Used     Comment: "quit smoking cigarettes in the 1990's"  . Alcohol Use: 2.4 oz/week    4 Shots of liquor, 0 Standard drinks or equivalent per week     Comment: 03/09/2014 "3-4 mixed drinks 3-4 times/wk"    Review of Systems  Constitutional:       Per HPI, otherwise negative  HENT:       Per HPI, otherwise negative  Respiratory:       Per HPI, otherwise negative  Cardiovascular:       Per HPI, otherwise negative  Gastrointestinal: Negative for nausea and vomiting.  Genitourinary:       Neg aside from HPI   Musculoskeletal: Negative for gait problem.  Skin: Positive for wound.  Allergic/Immunologic: Negative for immunocompromised state.  Neurological: Negative for dizziness, syncope, facial asymmetry, weakness, numbness and headaches.  Hematological: Bruises/bleeds easily.      Allergies  Review of patient's allergies indicates no known allergies.  Home Medications   Prior to Admission medications   Medication Sig Start Date End Date Taking? Authorizing Provider  amLODipine (NORVASC) 10 MG tablet TAKE 1 TABLET (10 MG TOTAL) BY MOUTH DAILY. 12/10/12   Biagio Borg, MD  aspirin 325 MG EC tablet Take 325 mg by mouth daily.      Historical Provider, MD  CRESTOR 20 MG tablet TAKE 1 TABLET (20 MG TOTAL) BY MOUTH DAILY.  12/10/12   Biagio Borg, MD  lisinopril (PRINIVIL,ZESTRIL) 20 MG tablet TAKE 1 TABLET (20 MG TOTAL) BY MOUTH DAILY. 12/12/13   Biagio Borg, MD  rivaroxaban (XARELTO) 20 MG TABS tablet Take 1 tablet (20 mg total) by mouth daily. 03/12/14   Erick Colace, NP  VIAGRA 100 MG tablet TAKE 1 TABLET (100 MG TOTAL) BY MOUTH DAILY AS NEEDED. 01/12/14   Biagio Borg, MD  ZETIA 10 MG tablet TAKE 1 TABLET (10 MG TOTAL) BY MOUTH DAILY. 12/17/13   Biagio Borg, MD   BP 143/94 mmHg  Pulse 81  Temp(Src) 98.3 F (36.8 C) (Oral)  Resp 22  SpO2 99% Physical Exam    Constitutional: He is oriented to person, place, and time. He appears well-developed. No distress.  HENT:  Head: Normocephalic and atraumatic.    Eyes: Conjunctivae are normal. Pupils are equal, round, and reactive to light. Left conjunctiva is not injected. Left conjunctiva has no hemorrhage. Right eye exhibits normal extraocular motion and no nystagmus. Left eye exhibits normal extraocular motion and no nystagmus.    Neck: Full passive range of motion without pain. Neck supple. No spinous process tenderness and no muscular tenderness present. No rigidity. No edema, no erythema and normal range of motion present.  Cardiovascular: Normal rate and regular rhythm.   Pulmonary/Chest: Effort normal. No stridor. No respiratory distress.  Abdominal: He exhibits no distension.  Musculoskeletal: He exhibits no edema.  Neurological: He is alert and oriented to person, place, and time.  Skin: Skin is warm and dry.  Psychiatric: He has a normal mood and affect.  Nursing note and vitals reviewed.   ED Course  Procedures (including critical care time) Labs Review Labs Reviewed - No data to display  Imaging Review Ct Head Wo Contrast  04/20/2014   CLINICAL DATA:  Left periorbital facial trauma and hematoma  EXAM: CT HEAD WITHOUT CONTRAST  CT MAXILLOFACIAL WITHOUT CONTRAST  TECHNIQUE: Multidetector CT imaging of the head and maxillofacial structures were performed using the standard protocol without intravenous contrast. Multiplanar CT image reconstructions of the maxillofacial structures were also generated.  COMPARISON:  None.  FINDINGS: CT HEAD FINDINGS  Right cerebellar encephalomalacia is identified image 6. Mild cortical volume loss noted with proportional ventricular prominence. Areas of periventricular white matter hypodensity are most compatible with small vessel ischemic change. No acute infarct is identified. Within the right frontal lobe at the level of the gray/white matter junction, is a  hyperdense 0.8 cm mass lesion image 24. No evidence for intra or extra-axial fluid/hemorrhage otherwise. There is a large left periorbital soft tissue hematoma. The retrobulbar fat is clean.  CT MAXILLOFACIAL FINDINGS  Partial opacification of the mastoid air cells is noted. Mild left frontal and ethmoid sinus mucoperiosteal thickening is noted. Zygomatic arches are unremarkable. No mandibular fracture. No orbital fracture is identified. The patient is partly edentulous.  IMPRESSION: 0.8 cm hyperdense right frontal lobe masslike lesion. Most common differential considerations include intraparenchymal hemorrhage in the setting of trauma, metastasis, and less likely primary brain malignancy. Brain MRI with contrast recommended for further differentiation.  Large left periorbital soft tissue hematoma without evidence for facial bone fracture.  These results were called by telephone at the time of interpretation on 04/20/2014 at 8:25 pm to Dr. Carmin Muskrat , who verbally acknowledged these results.   Electronically Signed   By: Conchita Paris M.D.   On: 04/20/2014 20:29   Mr Jeri Cos IH Contrast  04/20/2014   EXAM:  MRI HEAD WITHOUT AND WITH CONTRAST  TECHNIQUE: Multiplanar, multiecho pulse sequences of the brain and surrounding structures were obtained without and with intravenous contrast.  CONTRAST:  9mL MULTIHANCE GADOBENATE DIMEGLUMINE 529 MG/ML IV SOLN  COMPARISON:  Prior CT performed earlier on the same day.  FINDINGS: Diffuse prominence of the CSF containing spaces is compatible with generalized cerebral atrophy. Scattered and patchy T2/FLAIR hyperintensity within the periventricular deep white matter both cerebral hemispheres most consistent with chronic small vessel ischemic disease, fairly mild. Remote infarct present within the right cerebellar hemisphere.  Previously identified hyperdense lesion within the anterior right frontal lobe demonstrates heterogeneous T2 and FLAIR signal intensity with  blooming artifact on gradient echo sequence. There is a faint waist of enhancement within this lesion on postcontrast sequence, favored to reflect enhancement related to blood brain barrier breakdown. This lesion measures 11 x 12 x 13 mm. Finding is favored to reflect a small parenchymal contusion related to recent trauma.  No other focal intracranial lesions identified. Mild diffuse pachymeningeal enhancement noted, nonspecific, and may be related to prior intervention. No other evidence for CNS hypotension.  No acute intracranial infarct. Gray-white matter differentiation maintained.  No midline shift or mass effect. Ventricles normal in size without evidence of hydrocephalus. No extra-axial fluid collection.  Left frontal scalp hematoma again noted. No acute abnormality seen about the orbits.  Scattered mucoperiosteal thickening present within the ethmoidal air cells and left frontal sinus. Scattered fluid signal intensity present within the mastoid air cells bilaterally.  Craniocervical junction within normal limits. Scattered degenerative changes noted within the visualized upper cervical spine.  IMPRESSION: 1. 11 x 12 x 13 mm right frontal lobe lesion, which corresponds to previously seen hyperdensity on prior CT. This is favored to reflect a small parenchymal contusion. Short interval follow-up CT to ensure interval resolution recommended. 2. Left frontal scalp hematoma. 3. No other acute intracranial process. 4. Remote right cerebellar infarct. 5. Atrophy with mild chronic microvascular ischemic disease.   Electronically Signed   By: Jeannine Boga M.D.   On: 04/20/2014 22:55   Ct Maxillofacial Wo Cm  04/20/2014   CLINICAL DATA:  Left periorbital facial trauma and hematoma  EXAM: CT HEAD WITHOUT CONTRAST  CT MAXILLOFACIAL WITHOUT CONTRAST  TECHNIQUE: Multidetector CT imaging of the head and maxillofacial structures were performed using the standard protocol without intravenous contrast. Multiplanar  CT image reconstructions of the maxillofacial structures were also generated.  COMPARISON:  None.  FINDINGS: CT HEAD FINDINGS  Right cerebellar encephalomalacia is identified image 6. Mild cortical volume loss noted with proportional ventricular prominence. Areas of periventricular white matter hypodensity are most compatible with small vessel ischemic change. No acute infarct is identified. Within the right frontal lobe at the level of the gray/white matter junction, is a hyperdense 0.8 cm mass lesion image 24. No evidence for intra or extra-axial fluid/hemorrhage otherwise. There is a large left periorbital soft tissue hematoma. The retrobulbar fat is clean.  CT MAXILLOFACIAL FINDINGS  Partial opacification of the mastoid air cells is noted. Mild left frontal and ethmoid sinus mucoperiosteal thickening is noted. Zygomatic arches are unremarkable. No mandibular fracture. No orbital fracture is identified. The patient is partly edentulous.  IMPRESSION: 0.8 cm hyperdense right frontal lobe masslike lesion. Most common differential considerations include intraparenchymal hemorrhage in the setting of trauma, metastasis, and less likely primary brain malignancy. Brain MRI with contrast recommended for further differentiation.  Large left periorbital soft tissue hematoma without evidence for facial bone fracture.  These results were  called by telephone at the time of interpretation on 04/20/2014 at 8:25 pm to Dr. Carmin Muskrat , who verbally acknowledged these results.   Electronically Signed   By: Conchita Paris M.D.   On: 04/20/2014 20:29   I discussed patient's case with our radiologist. Recommendation for MRI, this was ordered.  On repeat exam the patient is in no distress.  11:11 PM Patient aware of all results. Patient is a primary care physician, with a good relationship, and will schedule repeat CT scan in 48 hours.  MDM   This patient on anticoagulation presents after mechanical trauma. Patient is  awake, alert, and 8 hours of monitoring here has no decompensation, no evidence for distress. Though the patient is on anticoagulation, there is no evidence for ongoing intracranial bleed. Patient discharged in stable condition to follow-up with primary care, for repeat imaging within 48 hours.   Carmin Muskrat, MD 04/20/14 [phone]

## 2014-04-20 NOTE — ED Notes (Signed)
Pt reports being at home and was walking down a hallway and ran into the doorframe.  Hematoma to left eye.  Pt is currently on Xarelto.  -LOC.

## 2014-04-20 NOTE — ED Notes (Signed)
Called CT about delay in CT scan.  Stated pt was next to get picked up.  Ice pack given to pt due to increased swelling to left eye.

## 2014-04-23 ENCOUNTER — Encounter: Payer: Self-pay | Admitting: Internal Medicine

## 2014-04-23 ENCOUNTER — Ambulatory Visit (INDEPENDENT_AMBULATORY_CARE_PROVIDER_SITE_OTHER): Payer: No Typology Code available for payment source | Admitting: Internal Medicine

## 2014-04-23 ENCOUNTER — Telehealth: Payer: Self-pay

## 2014-04-23 VITALS — BP 122/84 | HR 96 | Temp 97.4°F | Resp 18 | Ht 76.0 in | Wt 155.0 lb

## 2014-04-23 DIAGNOSIS — S06311D Contusion and laceration of right cerebrum with loss of consciousness of 30 minutes or less, subsequent encounter: Secondary | ICD-10-CM | POA: Diagnosis not present

## 2014-04-23 DIAGNOSIS — F1010 Alcohol abuse, uncomplicated: Secondary | ICD-10-CM

## 2014-04-23 DIAGNOSIS — I10 Essential (primary) hypertension: Secondary | ICD-10-CM | POA: Diagnosis not present

## 2014-04-23 DIAGNOSIS — F1090 Alcohol use, unspecified, uncomplicated: Secondary | ICD-10-CM

## 2014-04-23 DIAGNOSIS — Z7289 Other problems related to lifestyle: Secondary | ICD-10-CM

## 2014-04-23 DIAGNOSIS — S062X9A Diffuse traumatic brain injury with loss of consciousness of unspecified duration, initial encounter: Secondary | ICD-10-CM | POA: Insufficient documentation

## 2014-04-23 DIAGNOSIS — S062XAA Diffuse traumatic brain injury with loss of consciousness status unknown, initial encounter: Secondary | ICD-10-CM | POA: Insufficient documentation

## 2014-04-23 MED ORDER — ACETAMINOPHEN-CODEINE 300-30 MG PO TABS: 1.0000 | ORAL_TABLET | Freq: Four times a day (QID) | ORAL | Status: DC | PRN

## 2014-04-23 NOTE — Telephone Encounter (Signed)
Left message to call back regarding his visit this afternoon. Dr. Jenny Reichmann asked me to call the patient to warn that we do not put stitches in here and that he needs to go to the ER or a local urgent care for that.

## 2014-04-23 NOTE — Assessment & Plan Note (Signed)
stable overall by history and exam, recent data reviewed with pt, and pt to continue medical treatment as before,  to f/u any worsening symptoms or concerns BP Readings from Last 3 Encounters:  04/23/14 122/84  04/20/14 133/81  03/30/14 124/74

## 2014-04-23 NOTE — Patient Instructions (Addendum)
Please take all new medication as prescribed  Please continue all other medications as before, and refills have been done if requested.  Please have the pharmacy call with any other refills you may need.  Please continue your efforts at being more active, low cholesterol diet, and weight control.  You are otherwise up to date with prevention measures today.  Please keep your appointments with your specialists as you may have planned  You will be contacted regarding the referral for: CT scan of brain follow up test

## 2014-04-23 NOTE — Progress Notes (Signed)
Pre visit review using our clinic review tool, if applicable. No additional management support is needed unless otherwise documented below in the visit note. 

## 2014-04-23 NOTE — Assessment & Plan Note (Signed)
Urged to quit 

## 2014-04-23 NOTE — Progress Notes (Signed)
Subjective:    Patient ID: Charles [name], male    DOB: 12-26-1950, 64 y.o.   MRN: [id number]  HPI  Here to f/u, unfort fell to mostly left face to the floor with a few minutes? Lost consiousness per wife.  Had been drinking ETOH earlier in the day.  Seen in ER, CT then MRI head done confirming right brain contusion. Pt with ongoing pain/HA, as well as mod to severe left periorbital swelling.  Much brusing about the eyes.  Also new right red bleeding to the white part of the right eye noted today.  No new vision change, fever, cough and Pt denies chest pain, increased sob or doe, wheezing, orthopnea, PND, increased LE swelling, palpitations, dizziness or syncope.  Pt denies new neurological symptoms such as new headache, or facial or extremity weakness or numbness  Remains on xarelto Past Medical History  Diagnosis Date  . ABNORMAL ELECTROCARDIOGRAM 11/02/2008  . ABSCESS, LUNG 10/23/2006  . CHEST PAIN 12/24/2009  . Cramp of limb 07/19/2007  . DIVERTICULOSIS, COLON 03/07/2007  . 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/23/2006  . FLANK PAIN, LEFT 02/25/2010  . FREQUENCY, URINARY 12/24/2009  . HEMORRHOIDS 10/23/2006  . HYPERLIPIDEMIA 10/23/2006  . HYPERTENSION 10/23/2006  . PLANTAR FASCIITIS, LEFT 11/02/2008  . PSA, INCREASED 11/20/2007  . PULMONARY NODULE 05/14/2008  . Unspecified Peripheral Vascular Disease 10/23/2006  . BPH (benign prostatic hyperplasia) 11/22/2010  . DVT (deep venous thrombosis) 01/2014    LLE  . PE (pulmonary embolism) 02/2014  . Emphysema of lung   . BACTERIAL PNEUMONIA 12/28/2009  . Pneumonia 12/2013 X 2   Past Surgical History  Procedure Laterality Date  . Hemorrhoid surgery  ?1990's  . Inguinal hernia repair Bilateral ?2000's  . Shoulder arthroscopy w/ rotator cuff repair Right 11/2013  . Video bronchoscopy with endobronchial navigation N/A 03/11/2014    Procedure: VIDEO BRONCHOSCOPY WITH ENDOBRONCHIAL NAVIGATION;  Surgeon: Collene Gobble, MD;  Location: [facility];  Service: Thoracic;  Laterality:  N/A;    reports that he has quit smoking. His smoking use included Cigarettes. He has a 15 pack-year smoking history. He has never used smokeless tobacco. He reports that he drinks about 2.4 oz of alcohol per week. He reports that he uses illicit drugs (Marijuana). family history includes Heart disease in his father and mother; Kidney disease in his sister. No Known Allergies Current Outpatient Prescriptions on File Prior to Visit  Medication Sig Dispense Refill  . amLODipine (NORVASC) 10 MG tablet TAKE 1 TABLET (10 MG TOTAL) BY MOUTH DAILY. 90 tablet 3  . aspirin 325 MG EC tablet Take 325 mg by mouth daily.      . CRESTOR 20 MG tablet TAKE 1 TABLET (20 MG TOTAL) BY MOUTH DAILY. 90 tablet 3  . lisinopril (PRINIVIL,ZESTRIL) 20 MG tablet TAKE 1 TABLET (20 MG TOTAL) BY MOUTH DAILY. 90 tablet 3  . rivaroxaban (XARELTO) 20 MG TABS tablet Take 1 tablet (20 mg total) by mouth daily. 30 tablet   . VIAGRA 100 MG tablet TAKE 1 TABLET (100 MG TOTAL) BY MOUTH DAILY AS NEEDED. 10 tablet 3  . ZETIA 10 MG tablet TAKE 1 TABLET (10 MG TOTAL) BY MOUTH DAILY. 90 tablet 3   No current facility-administered medications on file prior to visit.   Review of Systems  Constitutional: Negative for unusual diaphoresis or other sweats  HENT: Negative for ringing in ear Eyes: Negative for double vision or worsening visual disturbance.  Respiratory: Negative for choking and stridor.   Gastrointestinal:  Negative for vomiting or other signifcant bowel change Genitourinary: Negative for hematuria or decreased urine volume.  Musculoskeletal: Negative for other MSK pain or swelling Skin: Negative for color change and worsening wound.  Neurological: Negative for tremors and numbness other than noted  Psychiatric/Behavioral: Negative for decreased concentration or agitation other than above       Objective:   Physical Exam BP 122/84 mmHg  Pulse 96  Temp(Src) 97.4 F (36.3 C) (Oral)  Resp 18  Ht 6\' 4"  (1.93 m)  Wt  155 lb 0.6 oz (70.326 kg)  BMI 18.88 kg/m2  SpO2 97% VS noted,  Constitutional: Pt appears well-developed, well-nourished.  HENT: Head: NCAT.  Right Ear: External ear normal.  Left Ear: External ear normal.  Eyes: . Pupils are equal, round, and reactive to light. Conjunctivae and EOM are normal except for right lateral subconjunctival hemorrhoage; severe soft tissue contusion swelling left periorbital, and mild subortibal  as well Neck: Normal range of motion. Neck supple.  Cardiovascular: Normal rate and regular rhythm.   Pulmonary/Chest: Effort normal and breath sounds without rales or wheezing.  Neurological: Pt is alert. Not confused , motor intact, gait somewhat unsteady, walks with walker Skin: Skin is warm. No rash Psychiatric: Pt behavior is normal. No agitation.     Assessment & Plan:

## 2014-04-23 NOTE — Assessment & Plan Note (Signed)
Neuro stable, for CT head f/u r/o hemorrhoage,  to f/u any worsening symptoms or concerns

## 2014-04-24 ENCOUNTER — Ambulatory Visit: Payer: No Typology Code available for payment source | Admitting: Internal Medicine

## 2014-05-05 ENCOUNTER — Inpatient Hospital Stay: Admission: RE | Admit: 2014-05-05 | Payer: No Typology Code available for payment source | Source: Ambulatory Visit

## 2014-05-13 ENCOUNTER — Ambulatory Visit (INDEPENDENT_AMBULATORY_CARE_PROVIDER_SITE_OTHER)
Admission: RE | Admit: 2014-05-13 | Discharge: 2014-05-13 | Disposition: A | Discharge status: Home / Self Care | Payer: No Typology Code available for payment source | Source: Ambulatory Visit | Attending: Internal Medicine | Admitting: Internal Medicine

## 2014-05-13 DIAGNOSIS — S06311D Contusion and laceration of right cerebrum with loss of consciousness of 30 minutes or less, subsequent encounter: Secondary | ICD-10-CM | POA: Diagnosis not present

## 2014-05-26 ENCOUNTER — Encounter: Payer: Self-pay | Admitting: Internal Medicine

## 2014-05-26 ENCOUNTER — Ambulatory Visit (INDEPENDENT_AMBULATORY_CARE_PROVIDER_SITE_OTHER): Payer: No Typology Code available for payment source | Admitting: Internal Medicine

## 2014-05-26 VITALS — BP 130/70 | HR 103 | Temp 97.9°F | Resp 18 | Ht 76.0 in | Wt 170.0 lb

## 2014-05-26 DIAGNOSIS — I2699 Other pulmonary embolism without acute cor pulmonale: Secondary | ICD-10-CM | POA: Diagnosis not present

## 2014-05-26 DIAGNOSIS — Z7289 Other problems related to lifestyle: Secondary | ICD-10-CM

## 2014-05-26 DIAGNOSIS — D649 Anemia, unspecified: Secondary | ICD-10-CM

## 2014-05-26 DIAGNOSIS — R972 Elevated prostate specific antigen [PSA]: Secondary | ICD-10-CM

## 2014-05-26 DIAGNOSIS — F1010 Alcohol abuse, uncomplicated: Secondary | ICD-10-CM

## 2014-05-26 DIAGNOSIS — I639 Cerebral infarction, unspecified: Secondary | ICD-10-CM | POA: Insufficient documentation

## 2014-05-26 DIAGNOSIS — Z0000 Encounter for general adult medical examination without abnormal findings: Secondary | ICD-10-CM

## 2014-05-26 DIAGNOSIS — Z0189 Encounter for other specified special examinations: Secondary | ICD-10-CM

## 2014-05-26 DIAGNOSIS — I10 Essential (primary) hypertension: Secondary | ICD-10-CM

## 2014-05-26 DIAGNOSIS — F1090 Alcohol use, unspecified, uncomplicated: Secondary | ICD-10-CM

## 2014-05-26 HISTORY — DX: Cerebral infarction, unspecified: I63.9

## 2014-05-26 HISTORY — DX: Anemia, unspecified: D64.9

## 2014-05-26 MED ORDER — TADALAFIL 20 MG PO TABS
20.0000 mg | ORAL_TABLET | Freq: Every day | ORAL | Status: DC | PRN
Start: 2014-05-26 — End: 2015-10-07

## 2014-05-26 NOTE — Assessment & Plan Note (Signed)
With DVT, for total 6 mo xarleto, then change back to asa 325 qd

## 2014-05-26 NOTE — Assessment & Plan Note (Signed)
stable overall by history and exam, recent data reviewed with pt, and pt to continue medical treatment as before,  to f/u any worsening symptoms or concerns BP Readings from Last 3 Encounters:  05/26/14 130/70  04/23/14 122/84  04/20/14 133/81

## 2014-05-26 NOTE — Assessment & Plan Note (Signed)
Has up and down psa, ok for f/u today

## 2014-05-26 NOTE — Patient Instructions (Addendum)
OK to stop the Aspirin when on the xarelto  Please continue to not drink ETOH  OK for change of viagra to cialis  Please continue all other medications as before, and refills have been done if requested.  Please have the pharmacy call with any other refills you may need.  Please continue your efforts at being more active, low cholesterol diet, and weight control.  Please keep your appointments with your specialists as you may have planned  Please go to the LAB in the Basement (turn left off the elevator) for the tests to be done today  You will be contacted by phone if any changes need to be made immediately.  Otherwise, you will receive a letter about your results with an explanation, but please check with MyChart first.  Please remember to sign up for MyChart if you have not done so, as this will be important to you in the future with finding out test results, communicating by private email, and scheduling acute appointments online when needed.  Please return in 6 months, or sooner if needed, with Lab testing done 3-5 days before

## 2014-05-26 NOTE — Progress Notes (Signed)
Subjective:    Patient ID: Charles [name], male    DOB: 07/28/1950, 64 y.o.   MRN: [id number]  HPI  Here to f/u; overall doing ok,  Pt denies chest pain, increasing sob or doe, wheezing, orthopnea, PND, increased LE swelling, palpitations, dizziness or syncope.  Pt denies new neurological symptoms such as new headache, or facial or extremity weakness or numbness.  Pt denies polydipsia, polyuria, or low sugar episode.   Pt denies new neurological symptoms such as new headache, or facial or extremity weakness or numbness.   Pt states overall good compliance with meds, mostly trying to follow appropriate diet, with wt overall stable,  but little exercise however.  Denies urinary symptoms such as dysuria, frequency, urgency, flank pain, hematuria or n/v, fever, chills.  No overt bleeding on xarelto. Last hgb > 11, baseline about 14.  Still on asa, Did stop ETOH completely feb 2016.  CT fu Mar 2016 c/w healed right frontal brain contusion, and old right cerebellar infarct. Asks for cialis instead of viagra due to insurance coverage Recent lung nodule biopsy neg for malignancy Past Medical History  Diagnosis Date  . ABNORMAL ELECTROCARDIOGRAM 11/02/2008  . ABSCESS, LUNG 10/23/2006  . CHEST PAIN 12/24/2009  . Cramp of limb 07/19/2007  . DIVERTICULOSIS, COLON 03/07/2007  . 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/23/2006  . FLANK PAIN, LEFT 02/25/2010  . FREQUENCY, URINARY 12/24/2009  . HEMORRHOIDS 10/23/2006  . HYPERLIPIDEMIA 10/23/2006  . HYPERTENSION 10/23/2006  . PLANTAR FASCIITIS, LEFT 11/02/2008  . PSA, INCREASED 11/20/2007  . PULMONARY NODULE 05/14/2008  . Unspecified Peripheral Vascular Disease 10/23/2006  . BPH (benign prostatic hyperplasia) 11/22/2010  . DVT (deep venous thrombosis) 01/2014    LLE  . PE (pulmonary embolism) 02/2014  . Emphysema of lung   . BACTERIAL PNEUMONIA 12/28/2009  . Pneumonia 12/2013 X 2   Past Surgical History  Procedure Laterality Date  . Hemorrhoid surgery  ?1990's  . Inguinal hernia repair  Bilateral ?2000's  . Shoulder arthroscopy w/ rotator cuff repair Right 11/2013  . Video bronchoscopy with endobronchial navigation N/A 03/11/2014    Procedure: VIDEO BRONCHOSCOPY WITH ENDOBRONCHIAL NAVIGATION;  Surgeon: Collene Gobble, MD;  Location: [facility];  Service: Thoracic;  Laterality: N/A;    reports that he has quit smoking. His smoking use included Cigarettes. He has a 15 pack-year smoking history. He has never used smokeless tobacco. He reports that he drinks about 2.4 oz of alcohol per week. He reports that he uses illicit drugs (Marijuana). family history includes Heart disease in his father and mother; Kidney disease in his sister. No Known Allergies Current Outpatient Prescriptions on File Prior to Visit  Medication Sig Dispense Refill  . amLODipine (NORVASC) 10 MG tablet TAKE 1 TABLET (10 MG TOTAL) BY MOUTH DAILY. 90 tablet 3  . aspirin 325 MG EC tablet Take 325 mg by mouth daily.      . CRESTOR 20 MG tablet TAKE 1 TABLET (20 MG TOTAL) BY MOUTH DAILY. 90 tablet 3  . lisinopril (PRINIVIL,ZESTRIL) 20 MG tablet TAKE 1 TABLET (20 MG TOTAL) BY MOUTH DAILY. 90 tablet 3  . rivaroxaban (XARELTO) 20 MG TABS tablet Take 1 tablet (20 mg total) by mouth daily. 30 tablet   . VIAGRA 100 MG tablet TAKE 1 TABLET (100 MG TOTAL) BY MOUTH DAILY AS NEEDED. 10 tablet 3  . ZETIA 10 MG tablet TAKE 1 TABLET (10 MG TOTAL) BY MOUTH DAILY. 90 tablet 3   No current facility-administered medications on file prior to  visit.   Review of Systems  Constitutional: Negative for unusual diaphoresis or night sweats HENT: Negative for ringing in ear or discharge Eyes: Negative for double vision or worsening visual disturbance.  Respiratory: Negative for choking and stridor.   Gastrointestinal: Negative for vomiting or other signifcant bowel change Genitourinary: Negative for hematuria or change in urine volume.  Musculoskeletal: Negative for other MSK pain or swelling Skin: Negative for color change and worsening  wound.  Neurological: Negative for tremors and numbness other than noted  Psychiatric/Behavioral: Negative for decreased concentration or agitation other than above       Objective:   Physical Exam BP 130/70 mmHg  Pulse 103  Temp(Src) 97.9 F (36.6 C) (Oral)  Resp 18  Ht 6\' 4"  (1.93 m)  Wt 170 lb (77.111 kg)  BMI 20.70 kg/m2  SpO2 97% VS noted,  Constitutional: Pt appears in no significant distress HENT: Head: NCAT.  Right Ear: External ear normal.  Left Ear: External ear normal.  Eyes: . Pupils are equal, round, and reactive to light. Conjunctivae and EOM are normal Neck: Normal range of motion. Neck supple.  Cardiovascular: Normal rate and regular rhythm.   Pulmonary/Chest: Effort normal and breath sounds without rales or wheezing.  Neurological: Pt is alert. Not confused , motor grossly intact Skin: Skin is warm. No rash, no LE edema Psychiatric: Pt behavior is normal. No agitation.     Assessment & Plan:

## 2014-05-26 NOTE — Assessment & Plan Note (Signed)
For f/u , should be improved,  Lab Results  Component Value Date   WBC 7.6 03/12/2014   HGB 11.6* 03/12/2014   HCT 33.8* 03/12/2014   MCV 95.8 03/12/2014   PLT 121* 03/12/2014

## 2014-05-26 NOTE — Progress Notes (Signed)
Pre visit review using our clinic review tool, if applicable. No additional management support is needed unless otherwise documented below in the visit note. 

## 2014-05-26 NOTE — Assessment & Plan Note (Signed)
Stopped since last visit, to cont abstain

## 2014-07-02 ENCOUNTER — Other Ambulatory Visit: Payer: Self-pay | Admitting: Internal Medicine

## 2014-09-15 ENCOUNTER — Other Ambulatory Visit: Payer: Self-pay | Admitting: Internal Medicine

## 2014-09-17 ENCOUNTER — Encounter: Payer: Self-pay | Admitting: Gastroenterology

## 2014-09-30 ENCOUNTER — Ambulatory Visit: Payer: No Typology Code available for payment source | Admitting: Emergency Medicine

## 2014-10-02 ENCOUNTER — Encounter: Payer: Self-pay | Admitting: Emergency Medicine

## 2014-10-02 ENCOUNTER — Ambulatory Visit (INDEPENDENT_AMBULATORY_CARE_PROVIDER_SITE_OTHER): Payer: No Typology Code available for payment source | Admitting: Emergency Medicine

## 2014-10-02 VITALS — BP 124/72 | HR 69 | Ht 76.0 in | Wt 175.0 lb

## 2014-10-02 DIAGNOSIS — I2699 Other pulmonary embolism without acute cor pulmonale: Secondary | ICD-10-CM

## 2014-10-02 DIAGNOSIS — R911 Solitary pulmonary nodule: Secondary | ICD-10-CM

## 2014-10-02 DIAGNOSIS — J449 Chronic obstructive pulmonary disease, unspecified: Secondary | ICD-10-CM | POA: Diagnosis not present

## 2014-10-02 NOTE — Assessment & Plan Note (Signed)
Does not appear to be an active issue at this time but he will need pulmonary function testing to define his degree of obstruction. He has stopped smoking

## 2014-10-02 NOTE — Assessment & Plan Note (Signed)
He has completed 9 months of anticoagulation. I would like for him to finish 12 months and then we will consider stopping and assessing him for hypercoagulable state

## 2014-10-02 NOTE — Progress Notes (Signed)
Subjective:    Patient ID: Charles [name], male    DOB: 27-Nov-1950, 64 y.o.   MRN: [id number]  HPI 64 year old former cigarette smoker, THC smoker, PVD, suspected COPD. He began to have some L CP,  Minimal cough in October. CXR 11/5, 12/10 showed right upper lobe infiltrate. He was treated with antibiotics with some resolution of his symptoms. A CT chest 12/30 showed R sided PE and a L LL spiculated nodule with 2 other smaller associated nodules concerning for malignancy. LE dopplers show LLE femoral DVT. He is referred for evaluation of his abnormal CT scan.   ROV 03/30/14 -- follows up for discussion of his bx results. There were atypical cells present on a single sample, all else negative. We discussed today the pros cons of repeat bx.   ROV 10/02/14 -- follow-up visit for history of tobacco use and suspected COPD, pulmonary embolism diagnosed in 02/18/14 for which he has been treated with Xarelto. He also has left lung nodules including a left lower lobe spiculated nodule for which he underwent bronchoscopy and biopsy in January 2016. The biopsies were negative for malignancy. He has been doing well. Has gained about 15 lbs. He is not having dyspnea, no cough, no hemoptysis. No evidence of bleeding on xarelto.    Review of Systems  Constitutional: Negative for fever, appetite change and unexpected weight change.  HENT: Negative for congestion, dental problem, ear pain, nosebleeds, postnasal drip, rhinorrhea, sinus pressure, sneezing, sore throat and trouble swallowing.   Eyes: Negative for redness and itching.  Respiratory: Negative for cough, chest tightness, shortness of breath and wheezing.   Cardiovascular: Negative for palpitations and leg swelling.  Gastrointestinal: Negative for nausea and vomiting.  Genitourinary: Negative for dysuria.  Musculoskeletal: Negative for joint swelling.  Skin: Negative for rash.  Neurological: Negative for headaches.  Hematological: Does not bruise/bleed  easily.  Psychiatric/Behavioral: Negative for dysphoric mood. The patient is not nervous/anxious.     Past Medical History  Diagnosis Date  . ABNORMAL ELECTROCARDIOGRAM 11/02/2008  . ABSCESS, LUNG 10/23/2006  . CHEST PAIN 12/24/2009  . Cramp of limb 07/19/2007  . DIVERTICULOSIS, COLON 03/07/2007  . 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/23/2006  . FLANK PAIN, LEFT 02/25/2010  . FREQUENCY, URINARY 12/24/2009  . HEMORRHOIDS 10/23/2006  . HYPERLIPIDEMIA 10/23/2006  . HYPERTENSION 10/23/2006  . PLANTAR FASCIITIS, LEFT 11/02/2008  . PSA, INCREASED 11/20/2007  . PULMONARY NODULE 05/14/2008  . Unspecified Peripheral Vascular Disease 10/23/2006  . BPH (benign prostatic hyperplasia) 11/22/2010  . DVT (deep venous thrombosis) 01/2014    LLE  . PE (pulmonary embolism) 02/2014  . Emphysema of lung   . BACTERIAL PNEUMONIA 12/28/2009  . Pneumonia 12/2013 X 2  . Anemia 05/26/2014  . 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 Kidney disease Sister     Renal transplant  . Heart disease Mother   . Heart disease Father      Social History   Social History  . Marital Status: Married    Spouse Name: N/A  . Number of Children: N/A  . Years of Education: N/A   Occupational History  . Not on file.   Social History Main Topics  . Smoking status: Former Smoker -- 1.00 packs/day for 15 years    Types: Cigarettes  . Smokeless tobacco: Never Used     Comment: "quit smoking cigarettes in the 1990's"  . Alcohol Use: 2.4 oz/week    4 Shots of liquor, 0  Standard drinks or equivalent per week     Comment: 03/09/2014 "3-4 mixed drinks 3-4 times/wk"  . Drug Use: Yes    Special: Marijuana     Comment: 03/09/2014 "smoke marijuana q couple days"  . Sexual Activity: Yes   Other Topics Concern  . Not on file   Social History Narrative     No Known Allergies   Outpatient Prescriptions Prior to Visit  Medication Sig Dispense Refill  . amLODipine (NORVASC) 10 MG tablet TAKE 1 TABLET (10 MG  TOTAL) BY MOUTH DAILY. 90 tablet 3  . amLODipine (NORVASC) 10 MG tablet TAKE 1 TABLET (10 MG TOTAL) BY MOUTH DAILY. 90 tablet 3  . CRESTOR 20 MG tablet TAKE 1 TABLET (20 MG TOTAL) BY MOUTH DAILY. 90 tablet 3  . lisinopril (PRINIVIL,ZESTRIL) 20 MG tablet TAKE 1 TABLET (20 MG TOTAL) BY MOUTH DAILY. 90 tablet 3  . tadalafil (CIALIS) 20 MG tablet Take 1 tablet (20 mg total) by mouth daily as needed for erectile dysfunction. 10 tablet 11  . VIAGRA 100 MG tablet TAKE 1 TABLET (100 MG TOTAL) BY MOUTH DAILY AS NEEDED. 10 tablet 3  . XARELTO 20 MG TABS tablet TAKE 1 TABLET BY MOUTH EVERY DAY WITH SUPPER 30 tablet 2  . ZETIA 10 MG tablet TAKE 1 TABLET (10 MG TOTAL) BY MOUTH DAILY. 90 tablet 3   No facility-administered medications prior to visit.         Objective:   Physical Exam Filed Vitals:   10/02/14 0931  BP: 124/72  Pulse: 69  Height: 6\' 4"  (1.93 m)  Weight: 175 lb (79.379 kg)  SpO2: 98%   Gen: Pleasant, thin, in no distress,  normal affect  ENT: No lesions,  mouth clear,  oropharynx clear, no postnasal drip  Neck: No JVD, no TMG, no carotid bruits  Lungs: No use of accessory muscles, clear without rales or rhonchi  Cardiovascular: RRR, heart sounds normal, no murmur or gallops, no peripheral edema  Musculoskeletal: No deformities, no cyanosis or clubbing  Neuro: alert, non focal  Skin: Warm, no lesions or rashes      Assessment & Plan:  COPD (chronic obstructive pulmonary disease) Does not appear to be an active issue at this time but he will need pulmonary function testing to define his degree of obstruction. He has stopped smoking  Acute pulmonary embolism He has completed 9 months of anticoagulation. I would like for him to finish 12 months and then we will consider stopping and assessing him for hypercoagulable state  Solitary pulmonary nodule Left lower lobe nodule that was biopsy negative in January. I will repeat his CT scan of the chest now to look for  interval change. We discussed the fact that he may still require repeat biopsy.

## 2014-10-02 NOTE — Patient Instructions (Signed)
We will repeat your CT scan of the chest Your disability paperwork was completed today Please continue Xarelto as you are taking it Follow with Dr Lamonte Sakai Next available after your CT scan of the chest

## 2014-10-02 NOTE — Assessment & Plan Note (Signed)
Left lower lobe nodule that was biopsy negative in January. I will repeat his CT scan of the chest now to look for interval change. We discussed the fact that he may still require repeat biopsy.

## 2014-10-08 ENCOUNTER — Other Ambulatory Visit: Payer: No Typology Code available for payment source

## 2014-10-19 ENCOUNTER — Other Ambulatory Visit (INDEPENDENT_AMBULATORY_CARE_PROVIDER_SITE_OTHER): Payer: No Typology Code available for payment source

## 2014-10-19 DIAGNOSIS — R911 Solitary pulmonary nodule: Secondary | ICD-10-CM | POA: Diagnosis not present

## 2014-10-19 LAB — BASIC METABOLIC PANEL
BUN: 12 mg/dL (ref 6–23)
CHLORIDE: 107 meq/L (ref 96–112)
CO2: 24 meq/L (ref 19–32)
Calcium: 9.1 mg/dL (ref 8.4–10.5)
Creatinine, Ser: 1.16 mg/dL (ref 0.40–1.50)
GFR: 82.01 mL/min (ref >60.00)
Glucose, Bld: 152 mg/dL — ABNORMAL HIGH (ref 70–99)
Potassium: 4.1 mEq/L (ref 3.5–5.1)
Sodium: 139 mEq/L (ref 135–145)

## 2014-10-22 ENCOUNTER — Encounter: Payer: Self-pay | Admitting: Gastroenterology

## 2014-10-26 ENCOUNTER — Other Ambulatory Visit: Payer: Self-pay | Admitting: Internal Medicine

## 2014-10-27 ENCOUNTER — Ambulatory Visit (INDEPENDENT_AMBULATORY_CARE_PROVIDER_SITE_OTHER)
Admission: RE | Admit: 2014-10-27 | Discharge: 2014-10-27 | Disposition: A | Discharge status: Home / Self Care | Payer: No Typology Code available for payment source | Source: Ambulatory Visit | Attending: Emergency Medicine | Admitting: Emergency Medicine

## 2014-10-27 DIAGNOSIS — R911 Solitary pulmonary nodule: Secondary | ICD-10-CM | POA: Diagnosis not present

## 2014-10-27 MED ORDER — IOHEXOL 300 MG/ML  SOLN
80.0000 mL | Freq: Once | INTRAMUSCULAR | Status: AC | PRN
  Administered 2014-10-27: 80 mL via INTRAVENOUS

## 2014-10-29 ENCOUNTER — Encounter: Payer: Self-pay | Admitting: Emergency Medicine

## 2014-10-29 ENCOUNTER — Ambulatory Visit (INDEPENDENT_AMBULATORY_CARE_PROVIDER_SITE_OTHER): Payer: No Typology Code available for payment source | Admitting: Emergency Medicine

## 2014-10-29 VITALS — BP 144/72 | HR 61 | Ht 76.0 in | Wt 177.2 lb

## 2014-10-29 DIAGNOSIS — R911 Solitary pulmonary nodule: Secondary | ICD-10-CM | POA: Diagnosis not present

## 2014-10-29 DIAGNOSIS — R918 Other nonspecific abnormal finding of lung field: Secondary | ICD-10-CM | POA: Diagnosis not present

## 2014-10-29 DIAGNOSIS — J449 Chronic obstructive pulmonary disease, unspecified: Secondary | ICD-10-CM | POA: Diagnosis not present

## 2014-10-29 NOTE — Assessment & Plan Note (Signed)
He is completed 9 months of anticoagulation.Marland Kitchen He will continue Xarelto through December, then go back to aspirin 325 mg daily in January. We will follow in January to perform a hypercoagulable panel.

## 2014-10-29 NOTE — Assessment & Plan Note (Signed)
Biopsy negative in January 2016. No significant interval change on CT scan performed 2 days ago. I would like to continue to follow with serial scans. His next scan will be in March 2017

## 2014-10-29 NOTE — Progress Notes (Signed)
Subjective:    Patient ID: Charles [name], male    DOB: [birth year], 64 y.o.   MRN: [id number]  HPI 64 year old former cigarette smoker, THC smoker, PVD, suspected COPD. He began to have some L CP,  Minimal cough in October. CXR 11/5, 12/10 showed right upper lobe infiltrate. He was treated with antibiotics with some resolution of his symptoms. A CT chest 12/30 showed R sided PE and a L LL spiculated nodule with 2 other smaller associated nodules concerning for malignancy. LE dopplers show LLE femoral DVT. He is referred for evaluation of his abnormal CT scan.   ROV 03/30/14 -- follows up for discussion of his bx results. There were atypical cells present on a single sample, all else negative. We discussed today the pros cons of repeat bx.   ROV 10/02/14 -- follow-up visit for history of tobacco use and suspected COPD, pulmonary embolism diagnosed in 02/18/14 for which he has been treated with Xarelto. He also has left lung nodules including a left lower lobe spiculated nodule for which he underwent bronchoscopy and biopsy in January 2016. The biopsies were negative for malignancy. He has been doing well. Has gained about 15 lbs. He is not having dyspnea, no cough, no hemoptysis. No evidence of bleeding on xarelto.   ROV 10/29/14 -- follow up for COPD, hx PE, a left lower lobe spatulated nodule that was negative for malignancy on bronchoscopy in January 2016. He underwent a repeat CT scan of his chest 9/6 that I have reviewed personally. It shows overall stability of his stellate primary nodules. I believe these are most consistent with scar but will need to be followed for interval change with serial CTs. Still on xarelto, will finish in December.    Review of Systems  Constitutional: Negative for fever, appetite change and unexpected weight change.  HENT: Negative for congestion, dental problem, ear pain, nosebleeds, postnasal drip, rhinorrhea, sinus pressure, sneezing, sore throat and trouble  swallowing.   Eyes: Negative for redness and itching.  Respiratory: Negative for cough, chest tightness, shortness of breath and wheezing.   Cardiovascular: Negative for palpitations and leg swelling.  Gastrointestinal: Negative for nausea and vomiting.  Genitourinary: Negative for dysuria.  Musculoskeletal: Negative for joint swelling.  Skin: Negative for rash.  Neurological: Negative for headaches.  Hematological: Does not bruise/bleed easily.  Psychiatric/Behavioral: Negative for dysphoric mood. The patient is not nervous/anxious.     Past Medical History  Diagnosis Date  . ABNORMAL ELECTROCARDIOGRAM 11/02/2008  . ABSCESS, LUNG 10/23/2006  . CHEST PAIN 12/24/2009  . Cramp of limb 07/19/2007  . DIVERTICULOSIS, COLON 03/07/2007  . 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/23/2006  . FLANK PAIN, LEFT 02/25/2010  . FREQUENCY, URINARY 12/24/2009  . HEMORRHOIDS 10/23/2006  . HYPERLIPIDEMIA 10/23/2006  . HYPERTENSION 10/23/2006  . PLANTAR FASCIITIS, LEFT 11/02/2008  . PSA, INCREASED 11/20/2007  . PULMONARY NODULE 05/14/2008  . Unspecified Peripheral Vascular Disease 10/23/2006  . BPH (benign prostatic hyperplasia) 11/22/2010  . DVT (deep venous thrombosis) 01/2014    LLE  . PE (pulmonary embolism) 02/2014  . Emphysema of lung   . BACTERIAL PNEUMONIA 12/28/2009  . Pneumonia 12/2013 X 2  . Anemia 05/26/2014  . 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 Kidney disease Sister     Renal transplant  . Heart disease Mother   . Heart disease Father      Social History   Social History  . Marital Status: Married  Spouse Name: N/A  . Number of Children: N/A  . Years of Education: N/A   Occupational History  . Not on file.   Social History Main Topics  . Smoking status: Former Smoker -- 1.00 packs/day for 15 years    Types: Cigarettes  . Smokeless tobacco: Never Used     Comment: "quit smoking cigarettes in the 1990's"  . Alcohol Use: 2.4 oz/week    4 Shots of liquor, 0  Standard drinks or equivalent per week     Comment: 03/09/2014 "3-4 mixed drinks 3-4 times/wk"  . Drug Use: Yes    Special: Marijuana     Comment: 03/09/2014 "smoke marijuana q couple days"  . Sexual Activity: Yes   Other Topics Concern  . Not on file   Social History Narrative     No Known Allergies   Outpatient Prescriptions Prior to Visit  Medication Sig Dispense Refill  . amLODipine (NORVASC) 10 MG tablet TAKE 1 TABLET (10 MG TOTAL) BY MOUTH DAILY. 90 tablet 3  . lisinopril (PRINIVIL,ZESTRIL) 20 MG tablet TAKE 1 TABLET (20 MG TOTAL) BY MOUTH DAILY. 90 tablet 3  . rosuvastatin (CRESTOR) 20 MG tablet TAKE 1 TABLET EVERY DAY 90 tablet 1  . tadalafil (CIALIS) 20 MG tablet Take 1 tablet (20 mg total) by mouth daily as needed for erectile dysfunction. 10 tablet 11  . XARELTO 20 MG TABS tablet TAKE 1 TABLET BY MOUTH EVERY DAY WITH SUPPER 30 tablet 2  . ZETIA 10 MG tablet TAKE 1 TABLET (10 MG TOTAL) BY MOUTH DAILY. 90 tablet 3  . CRESTOR 20 MG tablet TAKE 1 TABLET (20 MG TOTAL) BY MOUTH DAILY. 90 tablet 3  . amLODipine (NORVASC) 10 MG tablet TAKE 1 TABLET (10 MG TOTAL) BY MOUTH DAILY. 90 tablet 3  . VIAGRA 100 MG tablet TAKE 1 TABLET (100 MG TOTAL) BY MOUTH DAILY AS NEEDED. 10 tablet 3   No facility-administered medications prior to visit.         Objective:   Physical Exam Filed Vitals:   10/29/14 0906  BP: 144/72  Pulse: 61  Height: 6\' 4"  (1.93 m)  Weight: 177 lb 3.2 oz (80.377 kg)  SpO2: 98%   Gen: Pleasant, thin, in no distress,  normal affect  ENT: No lesions,  mouth clear,  oropharynx clear, no postnasal drip  Neck: No JVD, no TMG, no carotid bruits  Lungs: No use of accessory muscles, clear without rales or rhonchi  Cardiovascular: RRR, heart sounds normal, no murmur or gallops, no peripheral edema  Musculoskeletal: No deformities, no cyanosis or clubbing  Neuro: alert, non focal  Skin: Warm, no lesions or rashes      Assessment & Plan:  COPD (chronic  obstructive pulmonary disease) Has not required maintenance therapy to date. We'll continue to follow his overall status and sitter addition of bronchodilators in the future  Acute pulmonary embolism He is completed 9 months of anticoagulation.Marland Kitchen He will continue Xarelto through December, then go back to aspirin 325 mg daily in January. We will follow in January to perform a hypercoagulable panel.   Multiple nodules of lung Biopsy negative in January 2016. No significant interval change on CT scan performed 2 days ago. I would like to continue to follow with serial scans. His next scan will be in March 2017

## 2014-10-29 NOTE — Assessment & Plan Note (Signed)
Has not required maintenance therapy to date. We'll continue to follow his overall status and sitter addition of bronchodilators in the future

## 2014-10-29 NOTE — Patient Instructions (Signed)
Please continue to take Xarelto through December.  You may restart your aspirin 325 mg daily in January Follow with Dr. Lamonte Sakai in January. We will probably do blood work at that time to assess your blood clotting system.  We will plan to repeat your Ct scan of the chest in March 2017

## 2014-12-15 ENCOUNTER — Other Ambulatory Visit (INDEPENDENT_AMBULATORY_CARE_PROVIDER_SITE_OTHER): Payer: No Typology Code available for payment source

## 2014-12-15 DIAGNOSIS — Z0189 Encounter for other specified special examinations: Secondary | ICD-10-CM | POA: Diagnosis not present

## 2014-12-15 DIAGNOSIS — Z0000 Encounter for general adult medical examination without abnormal findings: Secondary | ICD-10-CM

## 2014-12-15 LAB — BASIC METABOLIC PANEL
BUN: 21 mg/dL (ref 6–23)
CHLORIDE: 112 meq/L (ref 96–112)
CO2: 23 meq/L (ref 19–32)
CREATININE: 1.22 mg/dL (ref 0.40–1.50)
Calcium: 9 mg/dL (ref 8.4–10.5)
GFR: 77.33 mL/min (ref >60.00)
Glucose, Bld: 116 mg/dL — ABNORMAL HIGH (ref 70–99)
POTASSIUM: 4.8 meq/L (ref 3.5–5.1)
Sodium: 143 mEq/L (ref 135–145)

## 2014-12-15 LAB — CBC WITH DIFFERENTIAL/PLATELET
BASOS ABS: 0.1 10*3/uL (ref 0.0–0.1)
BASOS PCT: 1.4 % (ref 0.0–3.0)
EOS ABS: 0.2 10*3/uL (ref 0.0–0.7)
Eosinophils Relative: 2.8 % (ref 0.0–5.0)
HEMATOCRIT: 37.2 % — AB (ref 39.0–52.0)
Hemoglobin: 12.4 g/dL — ABNORMAL LOW (ref 13.0–17.0)
LYMPHS ABS: 1.9 10*3/uL (ref 0.7–4.0)
Lymphocytes Relative: 31 % (ref 12.0–46.0)
MCHC: 33.4 g/dL (ref 30.0–36.0)
MCV: 92.2 fl (ref 78.0–100.0)
MONO ABS: 0.7 10*3/uL (ref 0.1–1.0)
Monocytes Relative: 10.8 % (ref 3.0–12.0)
NEUTROS ABS: 3.4 10*3/uL (ref 1.4–7.7)
NEUTROS PCT: 54 % (ref 43.0–77.0)
PLATELETS: 188 10*3/uL (ref 150.0–400.0)
RBC: 4.03 Mil/uL — ABNORMAL LOW (ref 4.22–5.81)
RDW: 13.8 % (ref 11.5–15.5)
WBC: 6.2 10*3/uL (ref 4.0–10.5)

## 2014-12-15 LAB — PSA: PSA: 1.2 ng/mL (ref 0.10–4.00)

## 2014-12-15 LAB — LIPID PANEL
CHOL/HDL RATIO: 5
Cholesterol: 170 mg/dL (ref 0–200)
HDL: 32 mg/dL — AB (ref >39.00)
LDL Cholesterol: 115 mg/dL — ABNORMAL HIGH (ref 0–99)
NONHDL: 138.03
Triglycerides: 114 mg/dL (ref 0.0–149.0)
VLDL: 22.8 mg/dL (ref 0.0–40.0)

## 2014-12-15 LAB — URINALYSIS, ROUTINE W REFLEX MICROSCOPIC
Bilirubin Urine: NEGATIVE
KETONES UR: NEGATIVE
Leukocytes, UA: NEGATIVE
Nitrite: NEGATIVE
PH: 6 (ref 5.0–8.0)
SPECIFIC GRAVITY, URINE: 1.02 (ref 1.000–1.030)
TOTAL PROTEIN, URINE-UPE24: NEGATIVE
UROBILINOGEN UA: 0.2 (ref 0.0–1.0)
Urine Glucose: NEGATIVE
WBC UA: NONE SEEN (ref 0–?)

## 2014-12-15 LAB — HEPATIC FUNCTION PANEL
ALK PHOS: 53 U/L (ref 39–117)
ALT: 9 U/L (ref 0–53)
AST: 12 U/L (ref 0–37)
Albumin: 3.7 g/dL (ref 3.5–5.2)
Bilirubin, Direct: 0.1 mg/dL (ref 0.0–0.3)
TOTAL PROTEIN: 6.6 g/dL (ref 6.0–8.3)
Total Bilirubin: 0.6 mg/dL (ref 0.2–1.2)

## 2014-12-15 LAB — TSH: TSH: 3.01 u[IU]/mL (ref 0.35–4.50)

## 2014-12-16 ENCOUNTER — Other Ambulatory Visit: Payer: Self-pay | Admitting: Internal Medicine

## 2014-12-18 ENCOUNTER — Ambulatory Visit (INDEPENDENT_AMBULATORY_CARE_PROVIDER_SITE_OTHER): Payer: No Typology Code available for payment source | Admitting: Internal Medicine

## 2014-12-18 ENCOUNTER — Encounter: Payer: Self-pay | Admitting: Internal Medicine

## 2014-12-18 VITALS — BP 124/80 | HR 83 | Temp 98.1°F | Ht 76.0 in | Wt 181.0 lb

## 2014-12-18 DIAGNOSIS — F1010 Alcohol abuse, uncomplicated: Secondary | ICD-10-CM

## 2014-12-18 DIAGNOSIS — F1090 Alcohol use, unspecified, uncomplicated: Secondary | ICD-10-CM

## 2014-12-18 DIAGNOSIS — R739 Hyperglycemia, unspecified: Secondary | ICD-10-CM

## 2014-12-18 DIAGNOSIS — Z0000 Encounter for general adult medical examination without abnormal findings: Secondary | ICD-10-CM | POA: Diagnosis not present

## 2014-12-18 DIAGNOSIS — Z7289 Other problems related to lifestyle: Secondary | ICD-10-CM

## 2014-12-18 MED ORDER — ROSUVASTATIN CALCIUM 40 MG PO TABS: 40.0000 mg | ORAL_TABLET | Freq: Every day | ORAL | Status: DC

## 2014-12-18 NOTE — Assessment & Plan Note (Signed)

## 2014-12-18 NOTE — Assessment & Plan Note (Signed)
Now abstinent x 6 mo,  to f/u any worsening symptoms or concerns

## 2014-12-18 NOTE — Progress Notes (Signed)
Pre visit review using our clinic review tool, if applicable. No additional management support is needed unless otherwise documented below in the visit note. 

## 2014-12-18 NOTE — Patient Instructions (Addendum)
Ok to increase the crestor to 40 mg per day  Your EKG was OK today  Please continue all other medications as before, and refills have been done if requested.  Please have the pharmacy call with any other refills you may need.  Please continue your efforts at being more active, low cholesterol diet, and weight control.  You are otherwise up to date with prevention measures today.  Please keep your appointments with your specialists as you may have planned  Please return in 6 months, or sooner if needed, with Lab testing done 3-5 days before

## 2014-12-18 NOTE — Assessment & Plan Note (Signed)
Asympt, ? Assoc with recent wt gain, for diet control, also check a1c with next labs

## 2014-12-18 NOTE — Progress Notes (Signed)
Subjective:    Patient ID: Charles [name], male    DOB: 04-12-1950, 64 y.o.   MRN: [id number]  HPI  Here for wellness and f/u;  Overall doing ok;  Pt denies Chest pain, worsening SOB, DOE, wheezing, orthopnea, PND, worsening LE edema, palpitations, dizziness or syncope.  Pt denies neurological change such as new headache, facial or extremity weakness.  Pt denies polydipsia, polyuria, or low sugar symptoms. Pt states overall good compliance with treatment and medications, good tolerability, and has been trying to follow appropriate diet.  Pt denies worsening depressive symptoms, suicidal ideation or panic. No fever, night sweats, wt loss, loss of appetite, or other constitutional symptoms.  Pt states good ability with ADL's, has low fall risk, home safety reviewed and adequate, no other significant changes in hearing or vision, and only occasionally active with exercise, though goes to gym several times per week.  Declines flu shot today.  Has appt with GI nov 2 Dr Ardis Hughs.  Still has some exertional leg pain c/w his known PAD.  Recently retired, diet improved with fats but more sweets, especially quit ETOH completely 6 mo ago. Gained 6 lbs wt. Wt Readings from Last 3 Encounters:  12/18/14 181 lb (82.101 kg)  10/29/14 177 lb 3.2 oz (80.377 kg)  10/02/14 175 lb (79.379 kg)   Past Medical History  Diagnosis Date  . ABNORMAL ELECTROCARDIOGRAM 11/02/2008  . ABSCESS, LUNG 10/23/2006  . CHEST PAIN 12/24/2009  . Cramp of limb 07/19/2007  . DIVERTICULOSIS, COLON 03/07/2007  . 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/23/2006  . FLANK PAIN, LEFT 02/25/2010  . FREQUENCY, URINARY 12/24/2009  . HEMORRHOIDS 10/23/2006  . HYPERLIPIDEMIA 10/23/2006  . HYPERTENSION 10/23/2006  . PLANTAR FASCIITIS, LEFT 11/02/2008  . PSA, INCREASED 11/20/2007  . PULMONARY NODULE 05/14/2008  . Unspecified Peripheral Vascular Disease 10/23/2006  . BPH (benign prostatic hyperplasia) 11/22/2010  . DVT (deep venous thrombosis) ([place]) 01/2014    LLE  . PE (pulmonary  embolism) 02/2014  . Emphysema of lung ([place])   . BACTERIAL PNEUMONIA 12/28/2009  . Pneumonia 12/2013 X 2  . Anemia 05/26/2014  . Cerebellar stroke ([place]) 05/26/2014   Past Surgical History  Procedure Laterality Date  . Hemorrhoid surgery  ?1990's  . Inguinal hernia repair Bilateral ?2000's  . Shoulder arthroscopy w/ rotator cuff repair Right 11/2013  . Video bronchoscopy with endobronchial navigation N/A 03/11/2014    Procedure: VIDEO BRONCHOSCOPY WITH ENDOBRONCHIAL NAVIGATION;  Surgeon: Collene Gobble, MD;  Location: [facility];  Service: Thoracic;  Laterality: N/A;    reports that he has quit smoking. His smoking use included Cigarettes. He has a 15 pack-year smoking history. He has never used smokeless tobacco. He reports that he drinks about 2.4 oz of alcohol per week. He reports that he uses illicit drugs (Marijuana). family history includes Heart disease in his father and mother; Kidney disease in his sister. No Known Allergies Current Outpatient Prescriptions on File Prior to Visit  Medication Sig Dispense Refill  . amLODipine (NORVASC) 10 MG tablet TAKE 1 TABLET (10 MG TOTAL) BY MOUTH DAILY. 90 tablet 3  . lisinopril (PRINIVIL,ZESTRIL) 20 MG tablet TAKE 1 TABLET (20 MG TOTAL) BY MOUTH DAILY. 90 tablet 3  . tadalafil (CIALIS) 20 MG tablet Take 1 tablet (20 mg total) by mouth daily as needed for erectile dysfunction. 10 tablet 11  . XARELTO 20 MG TABS tablet TAKE 1 TABLET BY MOUTH EVERY DAY WITH SUPPER 30 tablet 2  . ZETIA 10 MG tablet TAKE 1 TABLET (10  MG TOTAL) BY MOUTH DAILY. 90 tablet 3   No current facility-administered medications on file prior to visit.   Review of Systems Constitutional: Negative for increased diaphoresis, other activity, appetite or siginficant weight change other than noted HENT: Negative for worsening hearing loss, ear pain, facial swelling, mouth sores and neck stiffness.   Eyes: Negative for other worsening pain, redness or visual disturbance.  Respiratory:  Negative for shortness of breath and wheezing  Cardiovascular: Negative for chest pain and palpitations.  Gastrointestinal: Negative for diarrhea, blood in stool, abdominal distention or other pain Genitourinary: Negative for hematuria, flank pain or change in urine volume.  Musculoskeletal: Negative for myalgias or other joint complaints.  Skin: Negative for color change and wound or drainage.  Neurological: Negative for syncope and numbness. other than noted Hematological: Negative for adenopathy. or other swelling Psychiatric/Behavioral: Negative for hallucinations, SI, self-injury, decreased concentration or other worsening agitation.      Objective:   Physical Exam BP 124/80 mmHg  Pulse 83  Temp(Src) 98.1 F (36.7 C) (Oral)  Ht 6\' 4"  (1.93 m)  Wt 181 lb (82.101 kg)  BMI 22.04 kg/m2  SpO2 97% VS noted,  Constitutional: Pt is oriented to person, place, and time. Appears well-developed and well-nourished, in no significant distress Head: Normocephalic and atraumatic.  Right Ear: External ear normal.  Left Ear: External ear normal.  Nose: Nose normal.  Mouth/Throat: Oropharynx is clear and moist.  Eyes: Conjunctivae and EOM are normal. Pupils are equal, round, and reactive to light.  Neck: Normal range of motion. Neck supple. No JVD present. No tracheal deviation present or significant neck LA or mass Cardiovascular: Normal rate, regular rhythm, normal heart sounds and intact distal pulses.   Pulmonary/Chest: Effort normal and breath sounds without rales or wheezing  Abdominal: Soft. Bowel sounds are normal. NT. No HSM  Musculoskeletal: Normal range of motion. Exhibits no edema.  Lymphadenopathy:  Has no cervical adenopathy.  Neurological: Pt is alert and oriented to person, place, and time. Pt has normal reflexes. No cranial nerve deficit. Motor grossly intact Skin: Skin is warm and dry. No rash noted.  Psychiatric:  Has normal mood and affect. Behavior is normal.           Assessment & Plan:

## 2014-12-20 ENCOUNTER — Other Ambulatory Visit: Payer: Self-pay | Admitting: Internal Medicine

## 2014-12-23 ENCOUNTER — Encounter: Payer: Self-pay | Admitting: Gastroenterology

## 2014-12-23 ENCOUNTER — Ambulatory Visit (INDEPENDENT_AMBULATORY_CARE_PROVIDER_SITE_OTHER): Payer: No Typology Code available for payment source | Admitting: Gastroenterology

## 2014-12-23 VITALS — BP 130/70 | HR 84 | Ht 75.5 in | Wt 179.0 lb

## 2014-12-23 DIAGNOSIS — Z1211 Encounter for screening for malignant neoplasm of colon: Secondary | ICD-10-CM | POA: Diagnosis not present

## 2014-12-23 DIAGNOSIS — Z7901 Long term (current) use of anticoagulants: Secondary | ICD-10-CM | POA: Diagnosis not present

## 2014-12-23 NOTE — Patient Instructions (Signed)
You will be set up for a colonoscopy for colon cancer screening in Mid January (after you have completed your 1 year of xarelto).

## 2014-12-23 NOTE — Progress Notes (Signed)
HPI: This is a  Very pleasant 64 yo man  who was referred to me by Charles Borg, MD  to evaluate  Colon cancer screening, currently on blood thinner  Chief complaint is colon cancer screening, currently on blood thinner  Colonoscopy, Dr. Ardis [name] September 2006 for routine screening found diverticulosis in his left colon. Colonoscopy was otherwise normal. He was recommended to have repeat colonoscopy in 10 years for routine risk screening.  He had a colonoscopy about 10 years ago.  May have been with Dr. Everlean [name]  He thinks the last colonoscopy was here and was normal.  No colon cancer  In the family.  No weight changes.  Currently on xarelto for PE about 1 year ago. I reviewed notes from his pulmonologist and it appears that he will be stopping his Xarelto therapy this December.  Review of systems: Pertinent positive and negative review of systems were noted in the above HPI section. Complete review of systems was performed and was otherwise normal.   Past Medical History  Diagnosis Date  . ABNORMAL ELECTROCARDIOGRAM 11/02/2008  . ABSCESS, LUNG 10/23/2006  . CHEST PAIN 12/24/2009  . Cramp of limb 07/19/2007  . DIVERTICULOSIS, COLON 03/07/2007  . 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/23/2006  . FLANK PAIN, LEFT 02/25/2010  . FREQUENCY, URINARY 12/24/2009  . HEMORRHOIDS 10/23/2006  . HYPERLIPIDEMIA 10/23/2006  . HYPERTENSION 10/23/2006  . PLANTAR FASCIITIS, LEFT 11/02/2008  . PSA, INCREASED 11/20/2007  . PULMONARY NODULE 05/14/2008  . Unspecified Peripheral Vascular Disease 10/23/2006  . BPH (benign prostatic hyperplasia) 11/22/2010  . DVT (deep venous thrombosis) ([place]) 01/2014    LLE  . PE (pulmonary embolism) 02/2014  . Emphysema of lung ([place])   . BACTERIAL PNEUMONIA 12/28/2009  . Pneumonia 12/2013 X 2  . Anemia 05/26/2014  . Cerebellar stroke ([place]) 05/26/2014    Past Surgical History  Procedure Laterality Date  . Hemorrhoid surgery  ?1990's  . Inguinal hernia repair Bilateral ?2000's  . Shoulder arthroscopy  w/ rotator cuff repair Right 11/2013  . Video bronchoscopy with endobronchial navigation N/A 03/11/2014    Procedure: VIDEO BRONCHOSCOPY WITH ENDOBRONCHIAL NAVIGATION;  Surgeon: Charles Gobble, MD;  Location: [facility];  Service: Thoracic;  Laterality: N/A;    Current Outpatient Prescriptions  Medication Sig Dispense Refill  . amLODipine (NORVASC) 10 MG tablet TAKE 1 TABLET (10 MG TOTAL) BY MOUTH DAILY. 90 tablet 3  . lisinopril (PRINIVIL,ZESTRIL) 20 MG tablet TAKE 1 TABLET (20 MG TOTAL) BY MOUTH DAILY. 90 tablet 3  . rosuvastatin (CRESTOR) 40 MG tablet Take 1 tablet (40 mg total) by mouth daily. 90 tablet 3  . tadalafil (CIALIS) 20 MG tablet Take 1 tablet (20 mg total) by mouth daily as needed for erectile dysfunction. 10 tablet 11  . XARELTO 20 MG TABS tablet TAKE 1 TABLET BY MOUTH EVERY DAY WITH SUPPER 30 tablet 11  . ZETIA 10 MG tablet TAKE 1 TABLET (10 MG TOTAL) BY MOUTH DAILY. 90 tablet 3   No current facility-administered medications for this visit.    Allergies as of 12/23/2014  . (No Known Allergies)    Family History  Problem Relation Age of Onset  . Kidney disease Sister     Renal transplant  . Heart disease Mother   . Heart disease Father     Social History   Social History  . Marital Status: Married    Spouse Name: N/A  . Number of Children: 2  . Years of Education: N/A   Occupational History  .  retired    Social History Main Topics  . Smoking status: Former Smoker -- 1.00 packs/day for 15 years    Types: Cigarettes  . Smokeless tobacco: Never Used     Comment: "quit smoking cigarettes in the 1990's"  . Alcohol Use: No     Comment: stopped drinking 02/2014, was drinking abou 4 shots per week  . Drug Use: No     Comment: 03/09/2014 "smoke marijuana q couple days"  . Sexual Activity: Yes   Other Topics Concern  . Not on file   Social History Narrative     Physical Exam: BP 130/70 mmHg  Pulse 84  Ht 6' 3.5" (1.918 m)  Wt 179 lb (81.194 kg)  BMI 22.07  kg/m2 Constitutional: generally well-appearing Psychiatric: alert and oriented x3 Eyes: extraocular movements intact Mouth: oral pharynx moist, no lesions Neck: supple no lymphadenopathy Cardiovascular: heart regular rate and rhythm Lungs: clear to auscultation bilaterally Abdomen: soft, nontender, nondistended, no obvious ascites, no peritoneal signs, normal bowel sounds Extremities: no lower extremity edema bilaterally Skin: no lesions on visible extremities   Assessment and plan: 64 y.o. male with  routine risk for colon cancer, elevated risk for procedural complications on blood thinner for pulmonary embolus  It appears that he is going to be off of his blood thinner in about a month, that would be after a 1 year course. I explained to him that the need for colon cancer screening is by no means urgent and it would probably be safest if we simply wait for his blood thinner course to expire. We will put him in our schedule for colonoscopy in mid January. He knows to call here if there are any planned changes in his blood thinner course, extensions to it.   Charles Loffler, MD [facility] 12/23/2014, 8:36 AM  Cc: Charles Borg, MD

## 2015-02-09 ENCOUNTER — Other Ambulatory Visit: Payer: Self-pay

## 2015-02-09 MED ORDER — LISINOPRIL 20 MG PO TABS: ORAL_TABLET | ORAL | Status: DC

## 2015-03-03 ENCOUNTER — Encounter: Payer: Self-pay | Admitting: Cardiovascular Disease

## 2015-03-26 ENCOUNTER — Encounter: Payer: Self-pay | Admitting: Emergency Medicine

## 2015-03-26 ENCOUNTER — Ambulatory Visit (INDEPENDENT_AMBULATORY_CARE_PROVIDER_SITE_OTHER): Payer: No Typology Code available for payment source | Admitting: Emergency Medicine

## 2015-03-26 VITALS — BP 118/82 | HR 68 | Ht 75.5 in | Wt 182.6 lb

## 2015-03-26 DIAGNOSIS — I2699 Other pulmonary embolism without acute cor pulmonale: Secondary | ICD-10-CM

## 2015-03-26 DIAGNOSIS — J449 Chronic obstructive pulmonary disease, unspecified: Secondary | ICD-10-CM | POA: Diagnosis not present

## 2015-03-26 DIAGNOSIS — R918 Other nonspecific abnormal finding of lung field: Secondary | ICD-10-CM

## 2015-03-26 NOTE — Assessment & Plan Note (Signed)
He complains of ear fullness and pressure today. We will start loratadine to see if this benefits him

## 2015-03-26 NOTE — Addendum Note (Signed)
Addended by: Virl Cagey on: 03/26/2015 03:05 PM   Modules accepted: Orders

## 2015-03-26 NOTE — Assessment & Plan Note (Signed)
He has completed a year of anticoagulation. We can stop this now. I will check a hypercoagulable panel in 1 month

## 2015-03-26 NOTE — Assessment & Plan Note (Signed)
Repeat CT chest in March 2017 to look for interval change. I'll follow-up with him afterwards to review

## 2015-03-26 NOTE — Patient Instructions (Signed)
You can stop your Xarelto We will check blood work in 1 month Try starting loratadine 10mg  daily to see if this helps with ear congestion.  Repeat Ct chest in March 2017.  Follow with Dr Lamonte Sakai in march after the CT scan to review together.

## 2015-03-26 NOTE — Progress Notes (Signed)
Subjective:    Patient ID: Charles [name], male    DOB: 1950/09/02, 65 y.o.   MRN: [id number]  HPI 65 year old former cigarette smoker, THC smoker, PVD, suspected COPD. He began to have some L CP,  Minimal cough in October. CXR 11/5, 12/10 showed right upper lobe infiltrate. He was treated with antibiotics with some resolution of his symptoms. A CT chest 12/30 showed R sided PE and a L LL spiculated nodule with 2 other smaller associated nodules concerning for malignancy. LE dopplers show LLE femoral DVT. He is referred for evaluation of his abnormal CT scan.   ROV 03/30/14 -- follows up for discussion of his bx results. There were atypical cells present on a single sample, all else negative. We discussed today the pros cons of repeat bx.   ROV 10/02/14 -- follow-up visit for history of tobacco use and suspected COPD, pulmonary embolism diagnosed in 02/18/14 for which he has been treated with Xarelto. He also has left lung nodules including a left lower lobe spiculated nodule for which he underwent bronchoscopy and biopsy in January 2016. The biopsies were negative for malignancy. He has been doing well. Has gained about 15 lbs. He is not having dyspnea, no cough, no hemoptysis. No evidence of bleeding on xarelto.   ROV 10/29/14 -- follow up for COPD, hx PE, a left lower lobe spatulated nodule that was negative for malignancy on bronchoscopy in January 2016. He underwent a repeat CT scan of his chest 9/6 that I have reviewed personally. It shows overall stability of his stellate primary nodules. I believe these are most consistent with scar but will need to be followed for interval change with serial CTs. Still on xarelto, will finish in December.   ROV 03/26/15 -- patient with a history of COPD, pulmonary embolism and left lower lobes. Related nodule that we evaluated with bronchoscopy in 2016. His last CT scan of the chest was 10/27/14, he is due for repeat in March 2017. He Had planned to stop his xarelto  in December 2016, but he is still taking it. He hs having some ear fullness. He does not have sneezing or PND.    Review of Systems  Constitutional: Negative for fever, appetite change and unexpected weight change.  HENT: Negative for congestion, dental problem, ear pain, nosebleeds, postnasal drip, rhinorrhea, sinus pressure, sneezing, sore throat and trouble swallowing.   Eyes: Negative for redness and itching.  Respiratory: Negative for cough, chest tightness, shortness of breath and wheezing.   Cardiovascular: Negative for palpitations and leg swelling.  Gastrointestinal: Negative for nausea and vomiting.  Genitourinary: Negative for dysuria.  Musculoskeletal: Negative for joint swelling.  Skin: Negative for rash.  Neurological: Negative for headaches.  Hematological: Does not bruise/bleed easily.  Psychiatric/Behavioral: Negative for dysphoric mood. The patient is not nervous/anxious.     Past Medical History  Diagnosis Date  . ABNORMAL ELECTROCARDIOGRAM 11/02/2008  . ABSCESS, LUNG 10/23/2006  . CHEST PAIN 12/24/2009  . Cramp of limb 07/19/2007  . DIVERTICULOSIS, COLON 03/07/2007  . 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/23/2006  . FLANK PAIN, LEFT 02/25/2010  . FREQUENCY, URINARY 12/24/2009  . HEMORRHOIDS 10/23/2006  . HYPERLIPIDEMIA 10/23/2006  . HYPERTENSION 10/23/2006  . PLANTAR FASCIITIS, LEFT 11/02/2008  . PSA, INCREASED 11/20/2007  . PULMONARY NODULE 05/14/2008  . Unspecified Peripheral Vascular Disease 10/23/2006  . BPH (benign prostatic hyperplasia) 11/22/2010  . DVT (deep venous thrombosis) ([place]) 01/2014    LLE  . PE (pulmonary embolism) 02/2014  . Emphysema of lung ([place])   .  BACTERIAL PNEUMONIA 12/28/2009  . Pneumonia 12/2013 X 2  . Anemia 05/26/2014  . Cerebellar stroke ([place]) 05/26/2014     Family History  Problem Relation Age of Onset  . Kidney disease Sister     Renal transplant  . Heart disease Mother   . Heart disease Father      Social History   Social History  . Marital Status:  Married    Spouse Name: N/A  . Number of Children: 2  . Years of Education: N/A   Occupational History  . retired    Social History Main Topics  . Smoking status: Former Smoker -- 1.00 packs/day for 15 years    Types: Cigarettes  . Smokeless tobacco: Never Used     Comment: "quit smoking cigarettes in the 1990's"  . Alcohol Use: No     Comment: stopped drinking 02/2014, was drinking abou 4 shots per week  . Drug Use: No     Comment: 03/09/2014 "smoke marijuana q couple days"  . Sexual Activity: Yes   Other Topics Concern  . Not on file   Social History Narrative     No Known Allergies   Outpatient Prescriptions Prior to Visit  Medication Sig Dispense Refill  . amLODipine (NORVASC) 10 MG tablet TAKE 1 TABLET (10 MG TOTAL) BY MOUTH DAILY. 90 tablet 3  . lisinopril (PRINIVIL,ZESTRIL) 20 MG tablet TAKE 1 TABLET (20 MG TOTAL) BY MOUTH DAILY. 90 tablet 3  . rosuvastatin (CRESTOR) 40 MG tablet Take 1 tablet (40 mg total) by mouth daily. 90 tablet 3  . tadalafil (CIALIS) 20 MG tablet Take 1 tablet (20 mg total) by mouth daily as needed for erectile dysfunction. 10 tablet 11  . XARELTO 20 MG TABS tablet TAKE 1 TABLET BY MOUTH EVERY DAY WITH SUPPER 30 tablet 11  . ZETIA 10 MG tablet TAKE 1 TABLET (10 MG TOTAL) BY MOUTH DAILY. 90 tablet 3   No facility-administered medications prior to visit.         Objective:   Physical Exam Filed Vitals:   03/26/15 1417  BP: 118/82  Pulse: 68  Height: 6' 3.5" (1.918 m)  Weight: 182 lb 9.6 oz (82.827 kg)  SpO2: 97%   Gen: Pleasant, thin, in no distress,  normal affect  ENT: No lesions,  mouth clear,  oropharynx clear, no postnasal drip  Neck: No JVD, no TMG, no carotid bruits  Lungs: No use of accessory muscles, clear without rales or rhonchi  Cardiovascular: RRR, heart sounds normal, no murmur or gallops, no peripheral edema  Musculoskeletal: No deformities, no cyanosis or clubbing  Neuro: alert, non focal  Skin: Warm, no  lesions or rashes      Assessment & Plan:  COPD (chronic obstructive pulmonary disease) No indication to start scheduled bronchodilators at this time. We will continue to follow  Allergic rhinitis He complains of ear fullness and pressure today. We will start loratadine to see if this benefits him  Multiple nodules of lung Repeat CT chest in March 2017 to look for interval change. I'll follow-up with him afterwards to review  Acute pulmonary embolism He has completed a year of anticoagulation. We can stop this now. I will check a hypercoagulable panel in 1 month

## 2015-03-26 NOTE — Assessment & Plan Note (Signed)
No indication to start scheduled bronchodilators at this time. We will continue to follow

## 2015-03-29 ENCOUNTER — Telehealth: Payer: Self-pay | Admitting: Emergency Medicine

## 2015-03-29 MED ORDER — LORATADINE 10 MG PO TABS: 10.0000 mg | ORAL_TABLET | Freq: Every day | ORAL | Status: DC

## 2015-03-29 NOTE — Telephone Encounter (Signed)
Spoke with pt. He is referring to Loratadine. Rx has been sent in. Nothing further was needed.

## 2015-04-28 ENCOUNTER — Ambulatory Visit (INDEPENDENT_AMBULATORY_CARE_PROVIDER_SITE_OTHER)
Admission: RE | Admit: 2015-04-28 | Discharge: 2015-04-28 | Disposition: A | Discharge status: Home / Self Care | Payer: No Typology Code available for payment source | Source: Ambulatory Visit | Attending: Emergency Medicine | Admitting: Emergency Medicine

## 2015-04-28 DIAGNOSIS — R911 Solitary pulmonary nodule: Secondary | ICD-10-CM

## 2015-05-11 ENCOUNTER — Telehealth: Payer: Self-pay | Admitting: Emergency Medicine

## 2015-05-11 ENCOUNTER — Encounter: Payer: Self-pay | Admitting: Gastroenterology

## 2015-05-11 NOTE — Telephone Encounter (Signed)
Spoke with pt and advised that RB is out of the office this week but will return on Monday. Let pt know that RB will review CT scan Monday and we will call him with results. Pt voiced understanding. No further questions or concerns.

## 2015-05-18 NOTE — Telephone Encounter (Signed)
Spoke with pt. He is aware of results. Nothing further was needed. 

## 2015-05-18 NOTE — Telephone Encounter (Signed)
Please let the patient know that his nodules are all stable or smaller than on his prior scans. This is good news. We can review further at [place].

## 2015-06-01 ENCOUNTER — Encounter: Payer: Self-pay | Admitting: Internal Medicine

## 2015-06-01 ENCOUNTER — Ambulatory Visit (INDEPENDENT_AMBULATORY_CARE_PROVIDER_SITE_OTHER): Payer: No Typology Code available for payment source | Admitting: Internal Medicine

## 2015-06-01 VITALS — BP 140/80 | HR 70 | Temp 98.6°F | Resp 20 | Wt 186.0 lb

## 2015-06-01 DIAGNOSIS — Z0189 Encounter for other specified special examinations: Secondary | ICD-10-CM

## 2015-06-01 DIAGNOSIS — H6983 Other specified disorders of Eustachian tube, bilateral: Secondary | ICD-10-CM | POA: Diagnosis not present

## 2015-06-01 DIAGNOSIS — J3089 Other allergic rhinitis: Secondary | ICD-10-CM

## 2015-06-01 DIAGNOSIS — R739 Hyperglycemia, unspecified: Secondary | ICD-10-CM

## 2015-06-01 DIAGNOSIS — Z0000 Encounter for general adult medical examination without abnormal findings: Secondary | ICD-10-CM

## 2015-06-01 DIAGNOSIS — M545 Low back pain, unspecified: Secondary | ICD-10-CM | POA: Insufficient documentation

## 2015-06-01 DIAGNOSIS — I2510 Atherosclerotic heart disease of native coronary artery without angina pectoris: Secondary | ICD-10-CM

## 2015-06-01 HISTORY — DX: Atherosclerotic heart disease of native coronary artery without angina pectoris: I25.10

## 2015-06-01 MED ORDER — CYCLOBENZAPRINE HCL 5 MG PO TABS: 5.0000 mg | ORAL_TABLET | Freq: Three times a day (TID) | ORAL | Status: DC | PRN

## 2015-06-01 MED ORDER — TRAMADOL HCL 50 MG PO TABS: 50.0000 mg | ORAL_TABLET | Freq: Three times a day (TID) | ORAL | Status: DC | PRN

## 2015-06-01 MED ORDER — CETIRIZINE HCL 10 MG PO TABS: 10.0000 mg | ORAL_TABLET | Freq: Every day | ORAL | Status: DC

## 2015-06-01 MED ORDER — TRIAMCINOLONE ACETONIDE 55 MCG/ACT NA AERO: 2.0000 | INHALATION_SPRAY | Freq: Every day | NASAL | Status: DC

## 2015-06-01 MED ORDER — PREDNISONE 10 MG PO TABS: ORAL_TABLET | ORAL | Status: DC

## 2015-06-01 NOTE — Progress Notes (Signed)
Subjective:    Patient ID: Charles [name], male    DOB: 1951-01-01, 65 y.o.   MRN: [id number]  HPI  Here to f/u; overall doing ok,  Pt denies chest pain, increasing sob or doe, wheezing, orthopnea, PND, increased LE swelling, palpitations, dizziness or syncope.  Pt denies new neurological symptoms such as new headache, or facial or extremity weakness or numbness.  Pt denies polydipsia, polyuria, or low sugar episode.   Pt denies new neurological symptoms such as new headache, or facial or extremity weakness or numbness.   Pt states overall good compliance with meds, mostly trying to follow appropriate diet, with wt overall stable.  Does have several wks ongoing nasal allergy symptoms with clearish congestion, itch and sneezing, without fever, pain, ST, cough, swelling or wheezing.  Recently with worsening hearing loss - ? Eustachian congestion vs vs wax, claritin may help somewhat.  Also with new onset lbp x 3 days after heavy lifitng and bending epiosde, left lower back, dull, no radiation, No bowel or bladder change, fever, wt loss,  worsening LE pain/numbness/weakness, gait change or falls.  Denies urinary symptoms such as dysuria, frequency, urgency, flank pain, hematuria or n/v, fever, chills, though has had significant prostate issues in the past. Denies worsening reflux, abd pain, dysphagia, n/v, bowel change or blood.  Recent CT noting CAD calcification.  Past Medical History  Diagnosis Date  . ABNORMAL ELECTROCARDIOGRAM 11/02/2008  . ABSCESS, LUNG 10/23/2006  . CHEST PAIN 12/24/2009  . Cramp of limb 07/19/2007  . DIVERTICULOSIS, COLON 03/07/2007  . 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/23/2006  . FLANK PAIN, LEFT 02/25/2010  . FREQUENCY, URINARY 12/24/2009  . HEMORRHOIDS 10/23/2006  . HYPERLIPIDEMIA 10/23/2006  . HYPERTENSION 10/23/2006  . PLANTAR FASCIITIS, LEFT 11/02/2008  . PSA, INCREASED 11/20/2007  . PULMONARY NODULE 05/14/2008  . Unspecified Peripheral Vascular Disease 10/23/2006  . BPH (benign prostatic  hyperplasia) 11/22/2010  . DVT (deep venous thrombosis) ([place]) 01/2014    LLE  . PE (pulmonary embolism) 02/2014  . Emphysema of lung ([place])   . BACTERIAL PNEUMONIA 12/28/2009  . Pneumonia 12/2013 X 2  . Anemia 05/26/2014  . Cerebellar stroke ([place]) 05/26/2014  . Coronary artery calcification seen on CT scan 06/01/2015   Past Surgical History  Procedure Laterality Date  . Hemorrhoid surgery  ?1990's  . Inguinal hernia repair Bilateral ?2000's  . Shoulder arthroscopy w/ rotator cuff repair Right 11/2013  . Video bronchoscopy with endobronchial navigation N/A 03/11/2014    Procedure: VIDEO BRONCHOSCOPY WITH ENDOBRONCHIAL NAVIGATION;  Surgeon: Collene Gobble, MD;  Location: [facility];  Service: Thoracic;  Laterality: N/A;    reports that he has quit smoking. His smoking use included Cigarettes. He has a 15 pack-year smoking history. He has never used smokeless tobacco. He reports that he does not drink alcohol or use illicit drugs. family history includes Heart disease in his father and mother; Kidney disease in his sister. No Known Allergies Current Outpatient Prescriptions on File Prior to Visit  Medication Sig Dispense Refill  . amLODipine (NORVASC) 10 MG tablet TAKE 1 TABLET (10 MG TOTAL) BY MOUTH DAILY. 90 tablet 3  . lisinopril (PRINIVIL,ZESTRIL) 20 MG tablet TAKE 1 TABLET (20 MG TOTAL) BY MOUTH DAILY. 90 tablet 3  . rosuvastatin (CRESTOR) 40 MG tablet Take 1 tablet (40 mg total) by mouth daily. 90 tablet 3  . tadalafil (CIALIS) 20 MG tablet Take 1 tablet (20 mg total) by mouth daily as needed for erectile dysfunction. 10 tablet 11  . ZETIA  10 MG tablet TAKE 1 TABLET (10 MG TOTAL) BY MOUTH DAILY. 90 tablet 3   No current facility-administered medications on file prior to visit.   Review of Systems  Constitutional: Negative for unusual diaphoresis or night sweats HENT: Negative for ear swelling or discharge Eyes: Negative for worsening visual haziness  Respiratory: Negative for choking and  stridor.   Gastrointestinal: Negative for distension or worsening eructation Genitourinary: Negative for retention or change in urine volume.  Musculoskeletal: Negative for other MSK pain or swelling Skin: Negative for color change and worsening wound Neurological: Negative for tremors and numbness other than noted  Psychiatric/Behavioral: Negative for decreased concentration or agitation other than above       Objective:   Physical Exam BP 140/80 mmHg  Pulse 70  Temp(Src) 98.6 F (37 C) (Oral)  Resp 20  Wt 186 lb (84.369 kg)  SpO2 97% VS noted,  Constitutional: Pt appears in no apparent distress HENT: Head: NCAT.  Right Ear: External ear normal.  Left Ear: External ear normal.  Bilat tm's with mild erythema.  Max sinus areas non tender.  Pharynx with mild erythema, no exudate Eyes: . Pupils are equal, round, and reactive to light. Conjunctivae and EOM are normal Neck: Normal range of motion. Neck supple.  Cardiovascular: Normal rate and regular rhythm.   Pulmonary/Chest: Effort normal and breath sounds without rales or wheezing.  Abd:  Soft, NT, ND, + BS Spine nontender, + left lumbar paravertebral tender Neurological: Pt is alert. Not confused , motor 5/5 intact Skin: Skin is warm. No rash, no LE edema Psychiatric: Pt behavior is normal. No agitation.     Assessment & Plan:

## 2015-06-01 NOTE — Progress Notes (Signed)
Pre visit review using our clinic review tool, if applicable. No additional management support is needed unless otherwise documented below in the visit note. 

## 2015-06-01 NOTE — Patient Instructions (Signed)
Ok to stop the claritin  Please take all new medication as prescribed - the zyrtec, nasacort, prednisone, flexeril (muscle relaxer) and tramadol (for pain as needed)  Please continue all other medications as before, and refills have been done if requested.  Please have the pharmacy call with any other refills you may need.  Please continue your efforts at being more active, low cholesterol diet, and weight control.  Please keep your appointments with your specialists as you may have planned  Please return in 6 months, or sooner if needed, with Lab testing done 3-5 days before

## 2015-06-05 NOTE — Assessment & Plan Note (Signed)
Asympt, stable overall by history and exam, recent data reviewed with pt, and pt to continue medical treatment as before,  to f/u any worsening symptoms or concerns Lab Results  Component Value Date   HGBA1C 4.3* 11/18/2013

## 2015-06-05 NOTE — Assessment & Plan Note (Signed)
Mild to mod, for zyrtec/nasacort asd,  to f/u any worsening symptoms or concerns 

## 2015-06-05 NOTE — Assessment & Plan Note (Signed)
Asympt, d/w pt, for cont'd attention to risk factor modification

## 2015-06-05 NOTE — Assessment & Plan Note (Signed)
With recent pain exacerbation, exam benign, c/w MSK strain acutely, o/w stable overall by history and exam, recent data reviewed with pt, and pt for flexeril and tramadol prn,  to f/u any worsening symptoms or concerns

## 2015-06-05 NOTE — Assessment & Plan Note (Signed)
Mild to mod, for mucinex otc prn,  to f/u any worsening symptoms or concerns 

## 2015-06-14 ENCOUNTER — Ambulatory Visit (AMBULATORY_SURGERY_CENTER): Payer: Self-pay | Admitting: *Deleted

## 2015-06-14 VITALS — Ht 75.0 in | Wt 182.0 lb

## 2015-06-14 DIAGNOSIS — Z1211 Encounter for screening for malignant neoplasm of colon: Secondary | ICD-10-CM

## 2015-06-14 MED ORDER — NA SULFATE-K SULFATE-MG SULF 17.5-3.13-1.6 GM/177ML PO SOLN: ORAL | Status: DC

## 2015-06-14 NOTE — Progress Notes (Signed)
Patient denies any allergies to eggs or soy. Patient denies any problems with anesthesia/sedation. Patient denies any oxygen use at home and does not take any diet/weight loss medications. Patient declined EMMI education. Patient states he is OFF blood thinners.

## 2015-06-17 ENCOUNTER — Ambulatory Visit: Payer: No Typology Code available for payment source | Admitting: Internal Medicine

## 2015-06-21 ENCOUNTER — Ambulatory Visit (AMBULATORY_SURGERY_CENTER): Payer: No Typology Code available for payment source | Admitting: Gastroenterology

## 2015-06-21 ENCOUNTER — Encounter: Payer: Self-pay | Admitting: Gastroenterology

## 2015-06-21 VITALS — BP 124/71 | HR 55 | Temp 98.2°F | Resp 16 | Ht 75.0 in | Wt 182.0 lb

## 2015-06-21 DIAGNOSIS — D125 Benign neoplasm of sigmoid colon: Secondary | ICD-10-CM

## 2015-06-21 DIAGNOSIS — Z1211 Encounter for screening for malignant neoplasm of colon: Secondary | ICD-10-CM | POA: Diagnosis present

## 2015-06-21 DIAGNOSIS — D124 Benign neoplasm of descending colon: Secondary | ICD-10-CM

## 2015-06-21 MED ORDER — SODIUM CHLORIDE 0.9 % IV SOLN: 500.0000 mL | INTRAVENOUS | Status: DC

## 2015-06-21 NOTE — Op Note (Signed)
[facility] Patient Name: Charles [name] Procedure Date: 06/21/2015 8:27 AM MRN: [id number] Endoscopist: Milus Banister , MD Age: 65 Date of Birth: [birth year] Gender: Male Procedure:                Colonoscopy Indications:              Screening for colorectal malignant neoplasm                            (colonoscopy 2006 without polyps) Medicines:                Monitored Anesthesia Care Procedure:                Pre-Anesthesia Assessment:                           - Prior to the procedure, a History and Physical                            was performed, and patient medications and                            allergies were reviewed. The patient's tolerance of                            previous anesthesia was also reviewed. The risks                            and benefits of the procedure and the sedation                            options and risks were discussed with the patient.                            All questions were answered, and informed consent                            was obtained. Prior Anticoagulants: The patient has                            taken no previous anticoagulant or antiplatelet                            agents. ASA Grade Assessment: II - A patient with                            mild systemic disease. After reviewing the risks                            and benefits, the patient was deemed in                            satisfactory condition to undergo the procedure.  After obtaining informed consent, the colonoscope                            was passed under direct vision. Throughout the                            procedure, the patient's blood pressure, pulse, and                            oxygen saturations were monitored continuously. The                            Model CF-HQ190L [id number]) scope was introduced                            through the anus and advanced to the the cecum,     identified by appendiceal orifice and ileocecal                            valve. The colonoscopy was performed without                            difficulty. The patient tolerated the procedure                            well. The quality of the bowel preparation was                            excellent. The ileocecal valve, appendiceal                            orifice, and rectum were photographed. Scope In: 8:35:28 AM Scope Out: 8:50:10 AM Scope Withdrawal Time: 0 hours 12 minutes 39 seconds  Total Procedure Duration: 0 hours 14 minutes 42 seconds  Findings:                 Six sessile polyps were found in the sigmoid colon                            and descending colon. The polyps were 4 to 7 mm in                            size. These polyps were removed with a cold snare.                            Resection and retrieval were complete.                           Multiple small and large-mouthed diverticula were                            found in the entire colon.  Internal hemorrhoids were found. The hemorrhoids                            were small.                           The exam was otherwise without abnormality on                            direct and retroflexion views. Complications:            No immediate complications. Estimated blood loss:                            None. Estimated Blood Loss:     Estimated blood loss: none. Impression:               - Six 4 to 7 mm polyps in the sigmoid colon and in                            the descending colon, removed with a cold snare.                            Resected and retrieved.                           - Diverticulosis in the entire examined colon.                           - Internal hemorrhoids.                           - The examination was otherwise normal on direct                            and retroflexion views. Recommendation:           - Patient has a contact number available  for                            emergencies. The signs and symptoms of potential                            delayed complications were discussed with the                            patient. Return to normal activities tomorrow.                            Written discharge instructions were provided to the                            patient.                           - Resume previous diet.                           -  Continue present medications.                           You will receive a letter within 2-3 weeks with the                            pathology results and my final recommendations.                           If the polyp(s) is proven to be 'pre-cancerous' on                            pathology, you will need repeat colonoscopy in 3-5                            years. If the polyp(s) is NOT 'precancerous' on                            pathology then you should repeat colon cancer                            screening in 10 years with colonoscopy without need                            for colon cancer screening by any method prior to                            then (including stool testing). Milus Banister, MD 06/21/2015 8:55:01 AM This report has been signed electronically.

## 2015-06-21 NOTE — Progress Notes (Signed)
Report to PACU, RN, vss, BBS= Clear.  

## 2015-06-21 NOTE — Patient Instructions (Signed)
Colon polyps removed today, handout given on polyps,diverticuloisis, and hemorrhoids.  Result letter in your mail 2-3 weeks. Call us with any questions or concerns. Thank you!   YOU HAD AN ENDOSCOPIC PROCEDURE TODAY AT [facility]:   Refer to the procedure report that was given to you for any specific questions about what was found during the examination.  If the procedure report does not answer your questions, please call your gastroenterologist to clarify.  If you requested that your care partner not be given the details of your procedure findings, then the procedure report has been included in a sealed envelope for you to review at your convenience later.  YOU SHOULD EXPECT: Some feelings of bloating in the abdomen. Passage of more gas than usual.  Walking can help get rid of the air that was put into your GI tract during the procedure and reduce the bloating. If you had a lower endoscopy (such as a colonoscopy or flexible sigmoidoscopy) you may notice spotting of blood in your stool or on the toilet paper. If you underwent a bowel prep for your procedure, you may not have a normal bowel movement for a few days.  Please Note:  You might notice some irritation and congestion in your nose or some drainage.  This is from the oxygen used during your procedure.  There is no need for concern and it should clear up in a day or so.  SYMPTOMS TO REPORT IMMEDIATELY:   Following lower endoscopy (colonoscopy or flexible sigmoidoscopy):  Excessive amounts of blood in the stool  Significant tenderness or worsening of abdominal pains  Swelling of the abdomen that is new, acute  Fever of 100F or higher   For urgent or emergent issues, a gastroenterologist can be reached at any hour by calling [phone].   DIET: Your first meal following the procedure should be a small meal and then it is ok to progress to your normal diet. Heavy or fried foods are harder to digest and may make you  feel nauseous or bloated.  Likewise, meals heavy in dairy and vegetables can increase bloating.  Drink plenty of fluids but you should avoid alcoholic beverages for 24 hours.  ACTIVITY:  You should plan to take it easy for the rest of today and you should NOT DRIVE or use heavy machinery until tomorrow (because of the sedation medicines used during the test).    FOLLOW UP: Our staff will call the number listed on your records the next business day following your procedure to check on you and address any questions or concerns that you may have regarding the information given to you following your procedure. If we do not reach you, we will leave a message.  However, if you are feeling well and you are not experiencing any problems, there is no need to return our call.  We will assume that you have returned to your regular daily activities without incident.  If any biopsies were taken you will be contacted by phone or by letter within the next 1-3 weeks.  Please call us at [phone] if you have not heard about the biopsies in 3 weeks.    SIGNATURES/CONFIDENTIALITY: You and/or your care partner have signed paperwork which will be entered into your electronic medical record.  These signatures attest to the fact that that the information above on your After Visit Summary has been reviewed and is understood.  Full responsibility of the confidentiality of this discharge information lies with  you and/or your care-partner.

## 2015-06-21 NOTE — Progress Notes (Signed)
Last Marijuana usuage, Saturday, 06/19/15.

## 2015-06-21 NOTE — Progress Notes (Signed)
Called to room to assist during endoscopic procedure.  Patient ID and intended procedure confirmed with present staff. Received instructions for my participation in the procedure from the performing physician.  

## 2015-06-22 ENCOUNTER — Telehealth: Payer: Self-pay | Admitting: *Deleted

## 2015-06-22 NOTE — Telephone Encounter (Signed)
  Follow up Call-  Call back number 06/21/2015  Post procedure Call Back phone  # [phone]  Permission to leave phone message Yes     Patient questions:  Do you have a fever, pain , or abdominal swelling? No. Pain Score  0 *  Have you tolerated food without any problems? Yes.    Have you been able to return to your normal activities? Yes.    Do you have any questions about your discharge instructions: Diet   No. Medications  No. Follow up visit  No.  Do you have questions or concerns about your Care? No.  Actions: * If pain score is 4 or above: No action needed, pain <4.

## 2015-06-24 ENCOUNTER — Other Ambulatory Visit: Payer: Self-pay | Admitting: Internal Medicine

## 2015-06-24 ENCOUNTER — Encounter: Payer: Self-pay | Admitting: Internal Medicine

## 2015-06-24 ENCOUNTER — Other Ambulatory Visit (INDEPENDENT_AMBULATORY_CARE_PROVIDER_SITE_OTHER): Payer: No Typology Code available for payment source

## 2015-06-24 ENCOUNTER — Ambulatory Visit (INDEPENDENT_AMBULATORY_CARE_PROVIDER_SITE_OTHER): Payer: No Typology Code available for payment source | Admitting: Internal Medicine

## 2015-06-24 VITALS — BP 128/68 | HR 74 | Temp 98.4°F | Resp 16 | Ht 76.0 in | Wt 183.8 lb

## 2015-06-24 DIAGNOSIS — R6889 Other general symptoms and signs: Secondary | ICD-10-CM

## 2015-06-24 DIAGNOSIS — R972 Elevated prostate specific antigen [PSA]: Secondary | ICD-10-CM

## 2015-06-24 DIAGNOSIS — N400 Enlarged prostate without lower urinary tract symptoms: Secondary | ICD-10-CM | POA: Diagnosis not present

## 2015-06-24 DIAGNOSIS — Z0001 Encounter for general adult medical examination with abnormal findings: Secondary | ICD-10-CM | POA: Diagnosis not present

## 2015-06-24 DIAGNOSIS — I10 Essential (primary) hypertension: Secondary | ICD-10-CM

## 2015-06-24 DIAGNOSIS — R739 Hyperglycemia, unspecified: Secondary | ICD-10-CM | POA: Diagnosis not present

## 2015-06-24 DIAGNOSIS — I2510 Atherosclerotic heart disease of native coronary artery without angina pectoris: Secondary | ICD-10-CM

## 2015-06-24 DIAGNOSIS — N3000 Acute cystitis without hematuria: Secondary | ICD-10-CM

## 2015-06-24 DIAGNOSIS — E785 Hyperlipidemia, unspecified: Secondary | ICD-10-CM

## 2015-06-24 LAB — HEPATIC FUNCTION PANEL
ALBUMIN: 3.5 g/dL (ref 3.5–5.2)
ALK PHOS: 54 U/L (ref 39–117)
ALT: 11 U/L (ref 0–53)
AST: 12 U/L (ref 0–37)
Bilirubin, Direct: 0.1 mg/dL (ref 0.0–0.3)
Total Bilirubin: 0.4 mg/dL (ref 0.2–1.2)
Total Protein: 6.5 g/dL (ref 6.0–8.3)

## 2015-06-24 LAB — URINALYSIS, ROUTINE W REFLEX MICROSCOPIC
Bilirubin Urine: NEGATIVE
Ketones, ur: NEGATIVE
Nitrite: POSITIVE — AB
SPECIFIC GRAVITY, URINE: 1.02 (ref 1.000–1.030)
Total Protein, Urine: NEGATIVE
URINE GLUCOSE: NEGATIVE
Urobilinogen, UA: 0.2 (ref 0.0–1.0)
pH: 6 (ref 5.0–8.0)

## 2015-06-24 LAB — BASIC METABOLIC PANEL
BUN: 13 mg/dL (ref 6–23)
CO2: 26 mEq/L (ref 19–32)
Calcium: 9 mg/dL (ref 8.4–10.5)
Chloride: 110 mEq/L (ref 96–112)
Creatinine, Ser: 1.19 mg/dL (ref 0.40–1.50)
GFR: 79.45 mL/min (ref >60.00)
Glucose, Bld: 78 mg/dL (ref 70–99)
POTASSIUM: 5.2 meq/L — AB (ref 3.5–5.1)
Sodium: 141 mEq/L (ref 135–145)

## 2015-06-24 LAB — CBC WITH DIFFERENTIAL/PLATELET
BASOS PCT: 1.8 % (ref 0.0–3.0)
Basophils Absolute: 0.1 10*3/uL (ref 0.0–0.1)
Eosinophils Absolute: 0.1 10*3/uL (ref 0.0–0.7)
Eosinophils Relative: 1.6 % (ref 0.0–5.0)
HCT: 33.9 % — ABNORMAL LOW (ref 39.0–52.0)
Hemoglobin: 11.5 g/dL — ABNORMAL LOW (ref 13.0–17.0)
LYMPHS ABS: 1.5 10*3/uL (ref 0.7–4.0)
Lymphocytes Relative: 23.1 % (ref 12.0–46.0)
MCHC: 33.8 g/dL (ref 30.0–36.0)
MCV: 91.5 fl (ref 78.0–100.0)
MONO ABS: 0.8 10*3/uL (ref 0.1–1.0)
MONOS PCT: 11.9 % (ref 3.0–12.0)
NEUTROS ABS: 4.1 10*3/uL (ref 1.4–7.7)
NEUTROS PCT: 61.6 % (ref 43.0–77.0)
PLATELETS: 273 10*3/uL (ref 150.0–400.0)
RBC: 3.7 Mil/uL — ABNORMAL LOW (ref 4.22–5.81)
RDW: 15.5 % (ref 11.5–15.5)
WBC: 6.7 10*3/uL (ref 4.0–10.5)

## 2015-06-24 LAB — LIPID PANEL
CHOLESTEROL: 171 mg/dL (ref 0–200)
HDL: 32.4 mg/dL — ABNORMAL LOW (ref >39.00)
LDL CALC: 108 mg/dL — AB (ref 0–99)
NonHDL: 138.63
TRIGLYCERIDES: 154 mg/dL — AB (ref 0.0–149.0)
Total CHOL/HDL Ratio: 5
VLDL: 30.8 mg/dL (ref 0.0–40.0)

## 2015-06-24 LAB — TSH: TSH: 2.09 u[IU]/mL (ref 0.35–4.50)

## 2015-06-24 LAB — PSA: PSA: 5.3 ng/mL — ABNORMAL HIGH (ref 0.10–4.00)

## 2015-06-24 LAB — HEMOGLOBIN A1C: Hgb A1c MFr Bld: 4.8 % (ref 4.6–6.5)

## 2015-06-24 MED ORDER — LEVOFLOXACIN 500 MG PO TABS
500.0000 mg | ORAL_TABLET | Freq: Every day | ORAL | Status: DC
Start: 2015-06-24 — End: 2015-08-26

## 2015-06-24 MED ORDER — TADALAFIL 5 MG PO TABS: 5.0000 mg | ORAL_TABLET | Freq: Every day | ORAL | Status: DC | PRN

## 2015-06-24 NOTE — Assessment & Plan Note (Signed)
With known CAD by CT, goal ldl < 70 - to start the crestor 40 mg, lower chol diet,  to f/u any worsening symptoms or concerns

## 2015-06-24 NOTE — Patient Instructions (Signed)
OK to continue the crestor at 40 mg per day  Please take all new medication as prescribed - the cialis 5 mg per day  Please continue all other medications as before, and refills have been done if requested.  Please have the pharmacy call with any other refills you may need.  Please continue your efforts at being more active, low cholesterol diet, and weight control.  You are otherwise up to date with prevention measures today.  Please keep your appointments with your specialists as you may have planned  Please go to the LAB in the Basement (turn left off the elevator) for the tests to be done today  You will be contacted by phone if any changes need to be made immediately.  Otherwise, you will receive a letter about your results with an explanation, but please check with MyChart first.  Please remember to sign up for MyChart if you have not done so, as this will be important to you in the future with finding out test results, communicating by private email, and scheduling acute appointments online when needed.  Please return in 6 months, or sooner if needed, with Lab testing done 3-5 days before

## 2015-06-24 NOTE — Progress Notes (Signed)
Pre visit review using our clinic review tool, if applicable. No additional management support is needed unless otherwise documented below in the visit note. 

## 2015-06-24 NOTE — Progress Notes (Signed)
Subjective:    Patient ID: Charles [name], male    DOB: [birth year], 65 y.o.   MRN: [id number]  HPI  Here for wellness and f/u;  Overall doing ok;  Pt denies Chest pain, worsening SOB, DOE, wheezing, orthopnea, PND, worsening LE edema, palpitations, dizziness or syncope.  Pt denies neurological change such as new headache, facial or extremity weakness.  Pt denies polydipsia, polyuria, or low sugar symptoms. Pt states overall good compliance with treatment and medications, good tolerability, and has been trying to follow appropriate diet.  Pt denies worsening depressive symptoms, suicidal ideation or panic. No fever, night sweats, wt loss, loss of appetite, or other constitutional symptoms.  Pt states good ability with ADL's, has low fall risk, home safety reviewed and adequate, no other significant changes in hearing or vision, and only occasionally active with exercise.  Now realizes he was only taking 20 mg instead of 40 mg crestor, has been taking 40 mh for about 1 wk. Does have BPH symtpoms wrosening with urinary hesitancy and nocturia, incidentally improved with a friends cialis trial.   Pt denies polydipsia, polyuria,  Past Medical History  Diagnosis Date  . ABNORMAL ELECTROCARDIOGRAM 11/02/2008  . ABSCESS, LUNG 10/23/2006  . CHEST PAIN 12/24/2009  . Cramp of limb 07/19/2007  . DIVERTICULOSIS, COLON 03/07/2007  . 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/23/2006  . FLANK PAIN, LEFT 02/25/2010  . FREQUENCY, URINARY 12/24/2009  . HEMORRHOIDS 10/23/2006  . HYPERLIPIDEMIA 10/23/2006  . HYPERTENSION 10/23/2006  . PLANTAR FASCIITIS, LEFT 11/02/2008  . PSA, INCREASED 11/20/2007  . PULMONARY NODULE 05/14/2008  . Unspecified Peripheral Vascular Disease 10/23/2006  . BPH (benign prostatic hyperplasia) 11/22/2010  . DVT (deep venous thrombosis) ([place]) 01/2014    LLE  . PE (pulmonary embolism) 02/2014  . Emphysema of lung ([place])   . BACTERIAL PNEUMONIA 12/28/2009  . Pneumonia 12/2013 X 2  . Anemia 05/26/2014  . Cerebellar stroke ([place])  05/26/2014  . Coronary artery calcification seen on CT scan 06/01/2015   Past Surgical History  Procedure Laterality Date  . Hemorrhoid surgery  ?1990's  . Inguinal hernia repair Bilateral ?2000's  . Shoulder arthroscopy w/ rotator cuff repair Right 11/2013  . Video bronchoscopy with endobronchial navigation N/A 03/11/2014    Procedure: VIDEO BRONCHOSCOPY WITH ENDOBRONCHIAL NAVIGATION;  Surgeon: Collene Gobble, MD;  Location: [facility];  Service: Thoracic;  Laterality: N/A;    reports that he has quit smoking. His smoking use included Cigarettes. He has a 15 pack-year smoking history. He has never used smokeless tobacco. He reports that he uses illicit drugs (Marijuana). He reports that he does not drink alcohol. family history includes Heart disease in his father and mother; Kidney disease in his sister. There is no history of Colon cancer. No Known Allergies Current Outpatient Prescriptions on File Prior to Visit  Medication Sig Dispense Refill  . amLODipine (NORVASC) 10 MG tablet TAKE 1 TABLET (10 MG TOTAL) BY MOUTH DAILY. 90 tablet 3  . aspirin 325 MG tablet Take 325 mg by mouth daily.    . cetirizine (ZYRTEC) 10 MG tablet Take 1 tablet (10 mg total) by mouth daily. 30 tablet 11  . lisinopril (PRINIVIL,ZESTRIL) 20 MG tablet TAKE 1 TABLET (20 MG TOTAL) BY MOUTH DAILY. 90 tablet 3  . rosuvastatin (CRESTOR) 40 MG tablet Take 1 tablet (40 mg total) by mouth daily. 90 tablet 3  . tadalafil (CIALIS) 20 MG tablet Take 1 tablet (20 mg total) by mouth daily as needed for erectile dysfunction. 10 tablet  11  . ZETIA 10 MG tablet TAKE 1 TABLET (10 MG TOTAL) BY MOUTH DAILY. 90 tablet 3  . cyclobenzaprine (FLEXERIL) 5 MG tablet Take 1 tablet (5 mg total) by mouth 3 (three) times daily as needed for muscle spasms. (Patient not taking: Reported on 06/14/2015) 50 tablet 1  . traMADol (ULTRAM) 50 MG tablet Take 1 tablet (50 mg total) by mouth every 8 (eight) hours as needed. (Patient not taking: Reported on  06/14/2015) 50 tablet 0  . triamcinolone (NASACORT AQ) 55 MCG/ACT AERO nasal inhaler Place 2 sprays into the nose daily. (Patient not taking: Reported on 06/24/2015) 1 Inhaler 12   No current facility-administered medications on file prior to visit.   Review of Systems Constitutional: Negative for increased diaphoresis, or other activity, appetite or siginficant weight change other than noted HENT: Negative for worsening hearing loss, ear pain, facial swelling, mouth sores and neck stiffness.   Eyes: Negative for other worsening pain, redness or visual disturbance.  Respiratory: Negative for choking or stridor Cardiovascular: Negative for other chest pain and palpitations.  Gastrointestinal: Negative for worsening diarrhea, blood in stool, or abdominal distention Genitourinary: Negative for hematuria, flank pain or change in urine volume.  Musculoskeletal: Negative for myalgias or other joint complaints.  Skin: Negative for other color change and wound or drainage.  Neurological: Negative for syncope and numbness. other than noted Hematological: Negative for adenopathy. or other swelling Psychiatric/Behavioral: Negative for hallucinations, SI, self-injury, decreased concentration or other worsening agitation.      Objective:   Physical Exam BP 128/68 mmHg  Pulse 74  Temp(Src) 98.4 F (36.9 C) (Oral)  Resp 16  Ht 6\' 4"  (1.93 m)  Wt 183 lb 12.8 oz (83.371 kg)  BMI 22.38 kg/m2  SpO2 96% VS noted,  Constitutional: Pt is oriented to person, place, and time. Appears well-developed and well-nourished, in no significant distress Head: Normocephalic and atraumatic  Eyes: Conjunctivae and EOM are normal. Pupils are equal, round, and reactive to light Right Ear: External ear normal.  Left Ear: External ear normal Nose: Nose normal.  Mouth/Throat: Oropharynx is clear and moist  Neck: Normal range of motion. Neck supple. No JVD present. No tracheal deviation present or significant neck LA or  mass Cardiovascular: Normal rate, regular rhythm, normal heart sounds and intact distal pulses.   Pulmonary/Chest: Effort normal and breath sounds without rales or wheezing  Abdominal: Soft. Bowel sounds are normal. NT. No HSM  Musculoskeletal: Normal range of motion. Exhibits no edema Lymphadenopathy: Has no cervical adenopathy.  Neurological: Pt is alert and oriented to person, place, and time. Pt has normal reflexes. No cranial nerve deficit. Motor grossly intact Skin: Skin is warm and dry. No rash noted or new ulcers Psychiatric:  Has normal mood and affect. Behavior is normal.     Assessment & Plan:

## 2015-06-26 NOTE — Assessment & Plan Note (Signed)
stable overall by history and exam, recent data reviewed with pt, and pt to continue medical treatment as before,  to f/u any worsening symptoms or concerns BP Readings from Last 3 Encounters:  06/24/15 128/68  06/21/15 124/71  06/01/15 140/80

## 2015-06-26 NOTE — Assessment & Plan Note (Signed)
stable overall by history and exam, recent data reviewed with pt, and pt to continue medical treatment as before,  to f/u any worsening symptoms or concerns Lab Results  Component Value Date   HGBA1C 4.8 06/24/2015

## 2015-06-26 NOTE — Assessment & Plan Note (Signed)

## 2015-06-26 NOTE — Assessment & Plan Note (Addendum)
Mild to mod, for cialis 5 mg daily if ok with insurance,  to f/u any worsening symptoms or concerns  In addition to the time spent performing CPE, I spent an additional 40 minutes face to face,in which greater than 50% of this time was spent in counseling and coordination of care for patient's acute illness as documented.

## 2015-06-26 NOTE — Assessment & Plan Note (Signed)
D/w pt, for contd cardiac risk factor modification,  to f/u any worsening symptoms or concerns

## 2015-06-28 ENCOUNTER — Encounter: Payer: No Typology Code available for payment source | Admitting: Gastroenterology

## 2015-06-29 ENCOUNTER — Encounter: Payer: Self-pay | Admitting: Gastroenterology

## 2015-07-06 ENCOUNTER — Other Ambulatory Visit: Payer: Self-pay | Admitting: Internal Medicine

## 2015-08-25 ENCOUNTER — Telehealth: Payer: Self-pay

## 2015-08-25 ENCOUNTER — Other Ambulatory Visit (INDEPENDENT_AMBULATORY_CARE_PROVIDER_SITE_OTHER): Payer: No Typology Code available for payment source

## 2015-08-25 DIAGNOSIS — Z0000 Encounter for general adult medical examination without abnormal findings: Secondary | ICD-10-CM

## 2015-08-25 LAB — HEPATIC FUNCTION PANEL
ALK PHOS: 47 U/L (ref 39–117)
ALT: 8 U/L (ref 0–53)
AST: 12 U/L (ref 0–37)
Albumin: 4 g/dL (ref 3.5–5.2)
Bilirubin, Direct: 0.1 mg/dL (ref 0.0–0.3)
Total Bilirubin: 0.5 mg/dL (ref 0.2–1.2)
Total Protein: 6.9 g/dL (ref 6.0–8.3)

## 2015-08-25 LAB — BASIC METABOLIC PANEL
BUN: 16 mg/dL (ref 6–23)
CALCIUM: 8.9 mg/dL (ref 8.4–10.5)
CO2: 26 mEq/L (ref 19–32)
Chloride: 110 mEq/L (ref 96–112)
Creatinine, Ser: 1.17 mg/dL (ref 0.40–1.50)
GFR: 80.98 mL/min (ref >60.00)
GLUCOSE: 85 mg/dL (ref 70–99)
Potassium: 4.2 mEq/L (ref 3.5–5.1)
SODIUM: 139 meq/L (ref 135–145)

## 2015-08-25 LAB — CBC WITH DIFFERENTIAL/PLATELET
BASOS PCT: 0.7 % (ref 0.0–3.0)
Basophils Absolute: 0 10*3/uL (ref 0.0–0.1)
EOS ABS: 0.3 10*3/uL (ref 0.0–0.7)
EOS PCT: 4.3 % (ref 0.0–5.0)
HCT: 38.4 % — ABNORMAL LOW (ref 39.0–52.0)
HEMOGLOBIN: 13 g/dL (ref 13.0–17.0)
LYMPHS ABS: 2.4 10*3/uL (ref 0.7–4.0)
Lymphocytes Relative: 36.1 % (ref 12.0–46.0)
MCHC: 33.8 g/dL (ref 30.0–36.0)
MCV: 91.3 fl (ref 78.0–100.0)
MONO ABS: 0.8 10*3/uL (ref 0.1–1.0)
Monocytes Relative: 12.1 % — ABNORMAL HIGH (ref 3.0–12.0)
NEUTROS ABS: 3.1 10*3/uL (ref 1.4–7.7)
Neutrophils Relative %: 46.8 % (ref 43.0–77.0)
PLATELETS: 194 10*3/uL (ref 150.0–400.0)
RBC: 4.21 Mil/uL — ABNORMAL LOW (ref 4.22–5.81)
RDW: 14.2 % (ref 11.5–15.5)
WBC: 6.7 10*3/uL (ref 4.0–10.5)

## 2015-08-25 LAB — MICROALBUMIN / CREATININE URINE RATIO
CREATININE, U: 148.5 mg/dL
MICROALB UR: 1.5 mg/dL (ref 0.0–1.9)
Microalb Creat Ratio: 1 mg/g (ref 0.0–30.0)

## 2015-08-25 LAB — TSH: TSH: 1.84 u[IU]/mL (ref 0.35–4.50)

## 2015-08-25 LAB — LIPID PANEL
CHOL/HDL RATIO: 5
CHOLESTEROL: 167 mg/dL (ref 0–200)
HDL: 36.3 mg/dL — ABNORMAL LOW (ref >39.00)
LDL CALC: 109 mg/dL — AB (ref 0–99)
NonHDL: 131.08
Triglycerides: 111 mg/dL (ref 0.0–149.0)
VLDL: 22.2 mg/dL (ref 0.0–40.0)

## 2015-08-25 LAB — HEMOGLOBIN A1C: Hgb A1c MFr Bld: 4.6 % (ref 4.6–6.5)

## 2015-08-25 LAB — PSA: PSA: 1.54 ng/mL (ref 0.10–4.00)

## 2015-08-25 NOTE — Telephone Encounter (Signed)
Orders for lab work placed

## 2015-08-26 ENCOUNTER — Encounter: Payer: Self-pay | Admitting: Internal Medicine

## 2015-08-26 ENCOUNTER — Ambulatory Visit (INDEPENDENT_AMBULATORY_CARE_PROVIDER_SITE_OTHER): Payer: No Typology Code available for payment source | Admitting: Internal Medicine

## 2015-08-26 ENCOUNTER — Other Ambulatory Visit: Payer: No Typology Code available for payment source

## 2015-08-26 ENCOUNTER — Telehealth: Payer: Self-pay

## 2015-08-26 VITALS — BP 130/80 | HR 69 | Temp 98.1°F | Resp 20 | Wt 187.0 lb

## 2015-08-26 DIAGNOSIS — R972 Elevated prostate specific antigen [PSA]: Secondary | ICD-10-CM | POA: Diagnosis not present

## 2015-08-26 DIAGNOSIS — N3000 Acute cystitis without hematuria: Secondary | ICD-10-CM | POA: Diagnosis not present

## 2015-08-26 DIAGNOSIS — D649 Anemia, unspecified: Secondary | ICD-10-CM

## 2015-08-26 DIAGNOSIS — I10 Essential (primary) hypertension: Secondary | ICD-10-CM

## 2015-08-26 DIAGNOSIS — R300 Dysuria: Secondary | ICD-10-CM

## 2015-08-26 DIAGNOSIS — H9193 Unspecified hearing loss, bilateral: Secondary | ICD-10-CM | POA: Insufficient documentation

## 2015-08-26 DIAGNOSIS — E785 Hyperlipidemia, unspecified: Secondary | ICD-10-CM

## 2015-08-26 DIAGNOSIS — N390 Urinary tract infection, site not specified: Secondary | ICD-10-CM | POA: Insufficient documentation

## 2015-08-26 DIAGNOSIS — R739 Hyperglycemia, unspecified: Secondary | ICD-10-CM

## 2015-08-26 LAB — POCT URINALYSIS DIPSTICK
BILIRUBIN UA: NEGATIVE
Blood, UA: NEGATIVE
GLUCOSE UA: NEGATIVE
Ketones, UA: NEGATIVE
LEUKOCYTES UA: NEGATIVE
NITRITE UA: NEGATIVE
Protein, UA: NEGATIVE
Spec Grav, UA: 1.025
UROBILINOGEN UA: NEGATIVE
pH, UA: 6

## 2015-08-26 NOTE — Patient Instructions (Signed)
Please continue all other medications as before, including the crestor to 40 mg  Please have the pharmacy call with any other refills you may need.  Please continue your efforts at being more active, low cholesterol diet, and weight control.  Please keep your appointments with your specialists as you may have planned  Please return in 6 months, or sooner if needed, with Lab testing done 3-5 days before

## 2015-08-26 NOTE — Assessment & Plan Note (Signed)
?   eustach valve dysfxn vs other - for ENT referral as per request

## 2015-08-26 NOTE — Assessment & Plan Note (Signed)
No significant change, but then pt has not yet increased to 40 mg crestor; ok to do so, f/u lab next visit

## 2015-08-26 NOTE — Progress Notes (Addendum)
Subjective:    Patient ID: Charles [name], male    DOB: Sep 18, 1950, 65 y.o.   MRN: [id number]  HPI  Here to f/u, was only needing UA and PSA with LAB only f/u today, but there was some confusion, and pt went to labs, had all labs done (except the UA) including those preordered for 2018 physical.  Pt was tx for subclinical UTI last visit with elevated PSA.  Denies urinary symptoms such as dysuria, frequency, urgency, flank pain, hematuria or n/v, fever, chills.  No prior hx of same. Denies significant prostatism.  Pt denies fever, wt loss, night sweats, loss of appetite, or other constitutional symptoms  Bilat hearing loss persists, comes and goes, jsut can seem improve, mucinex and allergy meds not really helping. Asks for ENT referral.   Also mentions has been taking the crestor 20 mg recenlty to use them up, plans to change to 40 mg soon.  Pt denies chest pain, increased sob or doe, wheezing, orthopnea, PND, increased LE swelling, palpitations, dizziness or syncope. Camie Patience Past Medical History  Diagnosis Date  . ABNORMAL ELECTROCARDIOGRAM 11/02/2008  . ABSCESS, LUNG 10/23/2006  . CHEST PAIN 12/24/2009  . Cramp of limb 07/19/2007  . DIVERTICULOSIS, COLON 03/07/2007  . 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/23/2006  . FLANK PAIN, LEFT 02/25/2010  . FREQUENCY, URINARY 12/24/2009  . HEMORRHOIDS 10/23/2006  . HYPERLIPIDEMIA 10/23/2006  . HYPERTENSION 10/23/2006  . PLANTAR FASCIITIS, LEFT 11/02/2008  . PSA, INCREASED 11/20/2007  . PULMONARY NODULE 05/14/2008  . Unspecified Peripheral Vascular Disease 10/23/2006  . BPH (benign prostatic hyperplasia) 11/22/2010  . DVT (deep venous thrombosis) ([place]) 01/2014    LLE  . PE (pulmonary embolism) 02/2014  . Emphysema of lung ([place])   . BACTERIAL PNEUMONIA 12/28/2009  . Pneumonia 12/2013 X 2  . Anemia 05/26/2014  . Cerebellar stroke ([place]) 05/26/2014  . Coronary artery calcification seen on CT scan 06/01/2015   Past Surgical History  Procedure Laterality Date  . Hemorrhoid surgery   ?1990's  . Inguinal hernia repair Bilateral ?2000's  . Shoulder arthroscopy w/ rotator cuff repair Right 11/2013  . Video bronchoscopy with endobronchial navigation N/A 03/11/2014    Procedure: VIDEO BRONCHOSCOPY WITH ENDOBRONCHIAL NAVIGATION;  Surgeon: Collene Gobble, MD;  Location: [facility];  Service: Thoracic;  Laterality: N/A;    reports that he has quit smoking. His smoking use included Cigarettes. He has a 15 pack-year smoking history. He has never used smokeless tobacco. He reports that he uses illicit drugs (Marijuana). He reports that he does not drink alcohol. family history includes Heart disease in his father and mother; Kidney disease in his sister. There is no history of Colon cancer. No Known Allergies Current Outpatient Prescriptions on File Prior to Visit  Medication Sig Dispense Refill  . amLODipine (NORVASC) 10 MG tablet TAKE ONE TABLET BY MOUTH ONCE DAILY 90 tablet 0  . aspirin 325 MG tablet Take 325 mg by mouth daily.    Marland Kitchen lisinopril (PRINIVIL,ZESTRIL) 20 MG tablet TAKE 1 TABLET (20 MG TOTAL) BY MOUTH DAILY. 90 tablet 3  . rosuvastatin (CRESTOR) 40 MG tablet Take 1 tablet (40 mg total) by mouth daily. 90 tablet 3  . tadalafil (CIALIS) 20 MG tablet Take 1 tablet (20 mg total) by mouth daily as needed for erectile dysfunction. 10 tablet 11  . tadalafil (CIALIS) 5 MG tablet Take 1 tablet (5 mg total) by mouth daily as needed for erectile dysfunction. 30 tablet 11  . ZETIA 10 MG tablet TAKE 1 TABLET (  10 MG TOTAL) BY MOUTH DAILY. 90 tablet 3   No current facility-administered medications on file prior to visit.    Review of Systems  Constitutional: Negative for unusual diaphoresis or night sweats HENT: Negative for ear swelling or discharge Eyes: Negative for worsening visual haziness  Respiratory: Negative for choking and stridor.   Gastrointestinal: Negative for distension or worsening eructation Genitourinary: Negative for retention or change in urine volume.    Musculoskeletal: Negative for other MSK pain or swelling Skin: Negative for color change and worsening wound Neurological: Negative for tremors and numbness other than noted  Psychiatric/Behavioral: Negative for decreased concentration or agitation other than above       Objective:   Physical Exam BP 130/80 mmHg  Pulse 69  Temp(Src) 98.1 F (36.7 C) (Oral)  Resp 20  Wt 187 lb (84.823 kg)  SpO2 97% VS noted,  Constitutional: Pt appears in no apparent distress HENT: Head: NCAT.  Right Ear: External ear normal.  Left Ear: External ear normal.  Eyes: . Pupils are equal, round, and reactive to light. Conjunctivae and EOM are normal Neck: Normal range of motion. Neck supple.  Cardiovascular: Normal rate and regular rhythm.   Pulmonary/Chest: Effort normal and breath sounds without rales or wheezing.  Neurological: Pt is alert. Not confused , motor grossly intact. Cn ok except for reduced hearing to 15 ft whisper bilat, left > right Skin: Skin is warm. No rash, no LE edema Psychiatric: Pt behavior is normal. No agitation.   Lab Results  Component Value Date   WBC 6.7 08/25/2015   HGB 13.0 08/25/2015   HCT 38.4* 08/25/2015   PLT 194.0 08/25/2015   GLUCOSE 85 08/25/2015   CHOL 167 08/25/2015   TRIG 111.0 08/25/2015   HDL 36.30* 08/25/2015   LDLDIRECT 80.8 11/18/2013   LDLCALC 109* 08/25/2015   ALT 8 08/25/2015   AST 12 08/25/2015   NA 139 08/25/2015   K 4.2 08/25/2015   CL 110 08/25/2015   CREATININE 1.17 08/25/2015   BUN 16 08/25/2015   CO2 26 08/25/2015   TSH 1.84 08/25/2015   PSA 1.54 08/25/2015   INR 2.62* 03/09/2014   HGBA1C 4.6 08/25/2015   MICROALBUR 1.5 08/25/2015   POCT Urinalysis Dipstick  Status: Finalresult Visible to patient:  Not Released Dx:  Acute cystitis without hematuria   Normal           Ref Range 8:58 AM  69mo ago  51mo ago  68yr ago     Color, UA  yellow       Clarity, UA  cloudy       Glucose, UA  negative        Bilirubin, UA  negative       Ketones, UA  negative       Spec Grav, UA  1.025       Blood, UA  negative       pH, UA  6.0       Protein, UA  negative       Urobilinogen, UA  negative 0.2 0.2 0.2    Nitrite, UA  negative       Leukocytes, UA Negative  Negative               Assessment & Plan:

## 2015-08-26 NOTE — Progress Notes (Signed)
Pre visit review using our clinic review tool, if applicable. No additional management support is needed unless otherwise documented below in the visit note. 

## 2015-08-26 NOTE — Telephone Encounter (Signed)
Orders placed.

## 2015-08-26 NOTE — Assessment & Plan Note (Signed)
stable overall by history and exam, recent data reviewed with pt, and pt to continue medical treatment as before,  to f/u any worsening symptoms or concerns BP Readings from Last 3 Encounters:  08/26/15 130/80  06/24/15 128/68  06/21/15 124/71

## 2015-08-26 NOTE — Assessment & Plan Note (Signed)
spnotaneously resolved by recent lab,  to f/u any worsening symptoms or concerns

## 2015-08-26 NOTE — Assessment & Plan Note (Signed)
Subclinical, stable, for Udip today in f/u,  to f/u any worsening symptoms or concerns

## 2015-08-26 NOTE — Assessment & Plan Note (Signed)
With recent remarkable increase velocity , now improved after antibx to baseline, ok to hold on further testing at this time Lab Results  Component Value Date   PSA 1.54 08/25/2015   PSA 5.30* 06/24/2015   PSA 1.20 12/15/2014

## 2015-08-28 LAB — CULTURE, URINE COMPREHENSIVE
COLONY COUNT: NO GROWTH
Organism ID, Bacteria: NO GROWTH

## 2015-09-02 ENCOUNTER — Telehealth: Payer: Self-pay | Admitting: Internal Medicine

## 2015-09-02 NOTE — Telephone Encounter (Signed)
Patient called regarding hearing consult referral. Advised no notes indicated. Please provide status update to the patient.

## 2015-09-06 NOTE — Telephone Encounter (Signed)
appt with Dr Redmond Baseman  ([place]) 09/30/15  @ 9am , pt aware

## 2015-09-27 ENCOUNTER — Other Ambulatory Visit: Payer: Self-pay | Admitting: Internal Medicine

## 2015-10-04 ENCOUNTER — Telehealth: Payer: Self-pay | Admitting: Emergency Medicine

## 2015-10-04 NOTE — Telephone Encounter (Signed)
Pt called and wanted to know why his prescription was tadalafil (CIALIS) 20 MG tablet was denied. Please follow up thanks.

## 2015-10-05 NOTE — Telephone Encounter (Signed)
Pt called back about this. Please follow up thanks.

## 2015-10-06 ENCOUNTER — Telehealth: Payer: Self-pay | Admitting: Emergency Medicine

## 2015-10-06 DIAGNOSIS — H6983 Other specified disorders of Eustachian tube, bilateral: Secondary | ICD-10-CM | POA: Insufficient documentation

## 2015-10-06 NOTE — Telephone Encounter (Signed)
Pt is calling back today again. He is still asking why his prescription for Cialis was denied. Please follow up thanks.

## 2015-10-06 NOTE — Telephone Encounter (Signed)
Called and left message for patient to give [place] a call back. Unable to reach patient at numbers provided.

## 2015-10-06 NOTE — Telephone Encounter (Signed)
Error

## 2015-10-07 ENCOUNTER — Other Ambulatory Visit: Payer: Self-pay | Admitting: Internal Medicine

## 2015-10-07 MED ORDER — TADALAFIL 20 MG PO TABS: 20.0000 mg | ORAL_TABLET | Freq: Every day | ORAL | 11 refills | Status: DC | PRN

## 2015-10-07 NOTE — Telephone Encounter (Signed)
I am fairly certain he means the cialis  Ok for cialis 20 mg prn - done erx

## 2015-10-07 NOTE — Addendum Note (Signed)
Addended by: Biagio Borg on: 10/07/2015 06:30 PM   Modules accepted: Orders

## 2015-10-07 NOTE — Telephone Encounter (Signed)
Patient calling back.  States that the 5mg  is not working.  He wants to go back to 20mg .  Please call patient back at [phone].

## 2015-10-25 ENCOUNTER — Telehealth: Payer: Self-pay | Admitting: Emergency Medicine

## 2015-10-25 NOTE — Telephone Encounter (Signed)
Please call pt and schedule OV prior to end of year, overdue for follow-up.

## 2015-10-26 NOTE — Telephone Encounter (Signed)
lmtcb x1 for pt. 

## 2015-10-27 NOTE — Telephone Encounter (Signed)
lmtcb x2 for pt. 

## 2015-11-03 NOTE — Telephone Encounter (Signed)
lmtcb x3 for pt. 

## 2015-11-05 NOTE — Telephone Encounter (Signed)
I have attempted to contact this pt several times with no success and no call back. Message will be closed. I will make RB aware of this when he returns to the office.

## 2015-12-14 ENCOUNTER — Telehealth: Payer: Self-pay | Admitting: Emergency Medicine

## 2015-12-14 NOTE — Telephone Encounter (Signed)
Pt called and asked if the no show fee can be removed for 06/17/15. He was unaware of the appt and it was his first no show. Thanks.

## 2015-12-16 ENCOUNTER — Other Ambulatory Visit (INDEPENDENT_AMBULATORY_CARE_PROVIDER_SITE_OTHER): Payer: No Typology Code available for payment source

## 2015-12-16 DIAGNOSIS — R739 Hyperglycemia, unspecified: Secondary | ICD-10-CM | POA: Diagnosis not present

## 2015-12-16 LAB — LIPID PANEL
CHOLESTEROL: 184 mg/dL (ref 0–200)
HDL: 38.9 mg/dL — ABNORMAL LOW (ref >39.00)
LDL Cholesterol: 115 mg/dL — ABNORMAL HIGH (ref 0–99)
NonHDL: 144.7
TRIGLYCERIDES: 150 mg/dL — AB (ref 0.0–149.0)
Total CHOL/HDL Ratio: 5
VLDL: 30 mg/dL (ref 0.0–40.0)

## 2015-12-16 LAB — BASIC METABOLIC PANEL
BUN: 16 mg/dL (ref 6–23)
CALCIUM: 9.5 mg/dL (ref 8.4–10.5)
CO2: 26 meq/L (ref 19–32)
CREATININE: 1.16 mg/dL (ref 0.40–1.50)
Chloride: 109 mEq/L (ref 96–112)
GFR: 81.7 mL/min (ref >60.00)
GLUCOSE: 88 mg/dL (ref 70–99)
Potassium: 4.3 mEq/L (ref 3.5–5.1)
Sodium: 142 mEq/L (ref 135–145)

## 2015-12-16 LAB — HEMOGLOBIN A1C: HEMOGLOBIN A1C: 4.6 % (ref 4.6–6.5)

## 2015-12-21 ENCOUNTER — Encounter: Payer: Self-pay | Admitting: Emergency Medicine

## 2015-12-21 ENCOUNTER — Ambulatory Visit (INDEPENDENT_AMBULATORY_CARE_PROVIDER_SITE_OTHER): Payer: No Typology Code available for payment source | Admitting: Emergency Medicine

## 2015-12-21 VITALS — BP 134/80 | HR 62 | Ht 76.0 in | Wt 184.0 lb

## 2015-12-21 DIAGNOSIS — Z720 Tobacco use: Secondary | ICD-10-CM

## 2015-12-21 DIAGNOSIS — J449 Chronic obstructive pulmonary disease, unspecified: Secondary | ICD-10-CM | POA: Diagnosis not present

## 2015-12-21 DIAGNOSIS — I2699 Other pulmonary embolism without acute cor pulmonale: Secondary | ICD-10-CM

## 2015-12-21 DIAGNOSIS — R918 Other nonspecific abnormal finding of lung field: Secondary | ICD-10-CM | POA: Diagnosis not present

## 2015-12-21 DIAGNOSIS — R911 Solitary pulmonary nodule: Secondary | ICD-10-CM | POA: Diagnosis not present

## 2015-12-21 NOTE — Progress Notes (Signed)
Subjective:    Patient ID: Charles [name], male    DOB: 23-Jul-1950, 65 y.o.   MRN: [id number]  HPI 65 year old former cigarette smoker, THC smoker, PVD, suspected COPD. He began to have some L CP,  Minimal cough in October. CXR 11/5, 12/10 showed right upper lobe infiltrate. He was treated with antibiotics with some resolution of his symptoms. A CT chest 12/30 showed R sided PE and a L LL spiculated nodule with 2 other smaller associated nodules concerning for malignancy. LE dopplers show LLE femoral DVT. He is referred for evaluation of his abnormal CT scan.   ROV 03/30/14 -- follows up for discussion of his bx results. There were atypical cells present on a single sample, all else negative. We discussed today the pros cons of repeat bx.   ROV 10/02/14 -- follow-up visit for history of tobacco use and suspected COPD, pulmonary embolism diagnosed in 02/18/14 for which he has been treated with Xarelto. He also has left lung nodules including a left lower lobe spiculated nodule for which he underwent bronchoscopy and biopsy in January 2016. The biopsies were negative for malignancy. He has been doing well. Has gained about 15 lbs. He is not having dyspnea, no cough, no hemoptysis. No evidence of bleeding on xarelto.   ROV 10/29/14 -- follow up for COPD, hx PE, a left lower lobe spatulated nodule that was negative for malignancy on bronchoscopy in January 2016. He underwent a repeat CT scan of his chest 9/6 that I have reviewed personally. It shows overall stability of his stellate primary nodules. I believe these are most consistent with scar but will need to be followed for interval change with serial CTs. Still on xarelto, will finish in December.   ROV 03/26/15 -- patient with a history of COPD, pulmonary embolism and left lower lobes. Related nodule that we evaluated with bronchoscopy in 2016. His last CT scan of the chest was 10/27/14, he is due for repeat in March 2017. He Had planned to stop his xarelto  in December 2016, but he is still taking it. He hs having some ear fullness. He does not have sneezing or PND.   ROV 12/21/15 -- this is a follow-up visit for history of COPD, pulmonary emboli now off xarelto. He also has a left lower lobe spiculated nodule that was biopsied in January 2016 and was benign. Follow-up CT scans have been performed, most recently 04/28/15. There is some right upper lobe scar stable over several years, likewise the spatulated left lower lobe area has decreased in size and is more linear, scarlike. His activity is not limited - denies any dyspnea. He does have a lot of nasal congestion, has seen Dr Redmond Baseman, has been on flonase before without much help. He also coughs up mucous every morning.    Review of Systems  Constitutional: Negative for appetite change, fever and unexpected weight change.  HENT: Negative for congestion, dental problem, ear pain, nosebleeds, postnasal drip, rhinorrhea, sinus pressure, sneezing, sore throat and trouble swallowing.   Eyes: Negative for redness and itching.  Respiratory: Negative for cough, chest tightness, shortness of breath and wheezing.   Cardiovascular: Negative for palpitations and leg swelling.  Gastrointestinal: Negative for nausea and vomiting.  Genitourinary: Negative for dysuria.  Musculoskeletal: Negative for joint swelling.  Skin: Negative for rash.  Neurological: Negative for headaches.  Hematological: Does not bruise/bleed easily.  Psychiatric/Behavioral: Negative for dysphoric mood. The patient is not nervous/anxious.     Past Medical History:  Diagnosis  Date  . ABNORMAL ELECTROCARDIOGRAM 11/02/2008  . ABSCESS, LUNG 10/23/2006  . Anemia 05/26/2014  . BACTERIAL PNEUMONIA 12/28/2009  . BPH (benign prostatic hyperplasia) 11/22/2010  . 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 CHEST PAIN 12/24/2009  . Coronary artery calcification seen on CT scan 06/01/2015  . Cramp of limb 07/19/2007  . DIVERTICULOSIS, COLON 03/07/2007  . DVT  (deep venous thrombosis) ([place]) 01/2014   LLE  . Emphysema of lung ([place])   . 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/23/2006  . FLANK PAIN, LEFT 02/25/2010  . FREQUENCY, URINARY 12/24/2009  . HEMORRHOIDS 10/23/2006  . HYPERLIPIDEMIA 10/23/2006  . HYPERTENSION 10/23/2006  . PE (pulmonary embolism) 02/2014  . PLANTAR FASCIITIS, LEFT 11/02/2008  . Pneumonia 12/2013 X 2  . PSA, INCREASED 11/20/2007  . PULMONARY NODULE 05/14/2008  . Unspecified Peripheral Vascular Disease 10/23/2006     Family History  Problem Relation Age of Onset  . Kidney disease Sister     Renal transplant  . Heart disease Mother   . Heart disease Father   . Colon cancer Neg Hx      Social History   Social History  . Marital status: Married    Spouse name: N/A  . Number of children: 2  . Years of education: N/A   Occupational History  . retired    Social History Main Topics  . Smoking status: Former Smoker    Packs/day: 1.00    Years: 15.00    Types: Cigarettes  . Smokeless tobacco: Never Used     Comment: "quit smoking cigarettes in the 1990's"  . Alcohol use No     Comment: stopped drinking 02/2014, was drinking abou 4 shots per week  . Drug use:     Types: Marijuana     Comment: 03/09/2014 "smoke marijuana q couple days"  . Sexual activity: Yes   Other Topics Concern  . Not on file   Social History Narrative  . No narrative on file     No Known Allergies   Outpatient Medications Prior to Visit  Medication Sig Dispense Refill  . amLODipine (NORVASC) 10 MG tablet TAKE ONE TABLET BY MOUTH ONCE DAILY 90 tablet 0  . aspirin 325 MG tablet Take 325 mg by mouth daily.    Marland Kitchen lisinopril (PRINIVIL,ZESTRIL) 20 MG tablet TAKE 1 TABLET (20 MG TOTAL) BY MOUTH DAILY. 90 tablet 3  . rosuvastatin (CRESTOR) 40 MG tablet Take 1 tablet (40 mg total) by mouth daily. 90 tablet 3  . tadalafil (CIALIS) 20 MG tablet Take 1 tablet (20 mg total) by mouth daily as needed for erectile dysfunction. 10 tablet 11  . ZETIA 10 MG tablet TAKE 1 TABLET  (10 MG TOTAL) BY MOUTH DAILY. 90 tablet 3   No facility-administered medications prior to visit.          Objective:   Physical Exam Vitals:   12/21/15 0915  BP: 134/80  Pulse: 62  SpO2: 99%  Weight: 184 lb (83.5 kg)  Height: 6\' 4"  (1.93 m)   Gen: Pleasant, thin, in no distress,  normal affect  ENT: No lesions,  mouth clear,  oropharynx clear, no postnasal drip  Neck: No JVD, no TMG, no carotid bruits  Lungs: No use of accessory muscles, clear without rales or rhonchi  Cardiovascular: RRR, heart sounds normal, no murmur or gallops, no peripheral edema  Musculoskeletal: No deformities, no cyanosis or clubbing  Neuro: alert, non focal  Skin: Warm, no lesions or rashes      Assessment & Plan:  Solitary pulmonary nodule Pulmonary nodules being followed now by serial CT scan. Biopsy in January 2016 was benign. The areas have decreased in size and look more scarlike at this time. He has one more CT scan planned for December. If there is no clinical change or change on CT scan that I think we can stop following.  COPD (chronic obstructive pulmonary disease) Likely mild based on his clinical history. I do believe he needs poor function testing to quantifies airflows. We will perform this before follow-up visit in December.  Acute pulmonary embolism He has completed therapy, now off anticoagulation.  Baltazar Apo, MD, PhD 12/21/2015, 9:47 AM [facility] [phone] or if no answer [phone]

## 2015-12-21 NOTE — Assessment & Plan Note (Signed)
Pulmonary nodules being followed now by serial CT scan. Biopsy in January 2016 was benign. The areas have decreased in size and look more scarlike at this time. He has one more CT scan planned for December. If there is no clinical change or change on CT scan that I think we can stop following.

## 2015-12-21 NOTE — Assessment & Plan Note (Signed)
He has completed therapy, now off anticoagulation.

## 2015-12-21 NOTE — Assessment & Plan Note (Signed)
Likely mild based on his clinical history. I do believe he needs poor function testing to quantifies airflows. We will perform this before follow-up visit in December.

## 2015-12-21 NOTE — Patient Instructions (Addendum)
We will perform pulmonary function testing.  We will repeat your Ct scan in December 2017. If stable then you should not need any more scans.  Follow with Dr Redmond Baseman as planned.  Follow with Dr Lamonte Sakai in December after your testing is done.

## 2015-12-22 ENCOUNTER — Ambulatory Visit (INDEPENDENT_AMBULATORY_CARE_PROVIDER_SITE_OTHER): Payer: No Typology Code available for payment source | Admitting: Internal Medicine

## 2015-12-22 ENCOUNTER — Encounter: Payer: Self-pay | Admitting: Internal Medicine

## 2015-12-22 VITALS — BP 128/78 | HR 74 | Temp 98.2°F | Resp 20 | Wt 185.8 lb

## 2015-12-22 DIAGNOSIS — E785 Hyperlipidemia, unspecified: Secondary | ICD-10-CM | POA: Diagnosis not present

## 2015-12-22 DIAGNOSIS — I10 Essential (primary) hypertension: Secondary | ICD-10-CM | POA: Diagnosis not present

## 2015-12-22 DIAGNOSIS — Z0001 Encounter for general adult medical examination with abnormal findings: Secondary | ICD-10-CM

## 2015-12-22 DIAGNOSIS — R739 Hyperglycemia, unspecified: Secondary | ICD-10-CM

## 2015-12-22 DIAGNOSIS — I2510 Atherosclerotic heart disease of native coronary artery without angina pectoris: Secondary | ICD-10-CM | POA: Diagnosis not present

## 2015-12-22 NOTE — Progress Notes (Signed)
Subjective:    Patient ID: Charles [name], male    DOB: [birth year], 65 y.o.   MRN: [id number]  HPI  Here to f/u; overall doing ok,  Pt denies chest pain, increasing sob or doe, wheezing, orthopnea, PND, increased LE swelling, palpitations, dizziness or syncope.  Pt denies new neurological symptoms such as new headache, or facial or extremity weakness or numbness.  Pt denies polydipsia, polyuria, or low sugar episode.   Pt denies new neurological symptoms such as new headache, or facial or extremity weakness or numbness.   Pt states overall good compliance with meds, did have recent note CAD by CT, pt concerned if further eval needed  Also with a lump to right periscapular area, some enlarged since last year.  Also with bilat hearing loss, ? Wax, x 1 wk, without fever, HA, sinus congestion or pain though has had some eustachian dysfxn symptoms, has appt with ENT for tomorrow Wt Readings from Last 3 Encounters:  12/22/15 185 lb 12 oz (84.3 kg)  12/21/15 184 lb (83.5 kg)  08/26/15 187 lb (84.8 kg)   BP Readings from Last 3 Encounters:  12/22/15 128/78  12/21/15 134/80  08/26/15 130/80  Declines flu shot.   Past Medical History:  Diagnosis Date  . ABNORMAL ELECTROCARDIOGRAM 11/02/2008  . ABSCESS, LUNG 10/23/2006  . Anemia 05/26/2014  . BACTERIAL PNEUMONIA 12/28/2009  . BPH (benign prostatic hyperplasia) 11/22/2010  . 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 CHEST PAIN 12/24/2009  . Coronary artery calcification seen on CT scan 06/01/2015  . Cramp of limb 07/19/2007  . DIVERTICULOSIS, COLON 03/07/2007  . DVT (deep venous thrombosis) ([place]) 01/2014   LLE  . Emphysema of lung ([place])   . 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/23/2006  . FLANK PAIN, LEFT 02/25/2010  . FREQUENCY, URINARY 12/24/2009  . HEMORRHOIDS 10/23/2006  . HYPERLIPIDEMIA 10/23/2006  . HYPERTENSION 10/23/2006  . PE (pulmonary embolism) 02/2014  . PLANTAR FASCIITIS, LEFT 11/02/2008  . Pneumonia 12/2013 X 2  . PSA, INCREASED 11/20/2007  . PULMONARY NODULE  05/14/2008  . Unspecified Peripheral Vascular Disease 10/23/2006   Past Surgical History:  Procedure Laterality Date  . HEMORRHOID SURGERY  ?1990's  . INGUINAL HERNIA REPAIR Bilateral ?2000's  . SHOULDER ARTHROSCOPY W/ ROTATOR CUFF REPAIR Right 11/2013  . VIDEO BRONCHOSCOPY WITH ENDOBRONCHIAL NAVIGATION N/A 03/11/2014   Procedure: VIDEO BRONCHOSCOPY WITH ENDOBRONCHIAL NAVIGATION;  Surgeon: Collene Gobble, MD;  Location: [facility];  Service: Thoracic;  Laterality: N/A;    reports that he has quit smoking. His smoking use included Cigarettes. He has a 15.00 pack-year smoking history. He has never used smokeless tobacco. He reports that he uses drugs, including Marijuana. He reports that he does not drink alcohol. family history includes Heart disease in his father and mother; Kidney disease in his sister. No Known Allergies Current Outpatient Prescriptions on File Prior to Visit  Medication Sig Dispense Refill  . amLODipine (NORVASC) 10 MG tablet TAKE ONE TABLET BY MOUTH ONCE DAILY 90 tablet 0  . aspirin 325 MG tablet Take 325 mg by mouth daily.    Marland Kitchen lisinopril (PRINIVIL,ZESTRIL) 20 MG tablet TAKE 1 TABLET (20 MG TOTAL) BY MOUTH DAILY. 90 tablet 3  . rosuvastatin (CRESTOR) 40 MG tablet Take 1 tablet (40 mg total) by mouth daily. 90 tablet 3  . tadalafil (CIALIS) 20 MG tablet Take 1 tablet (20 mg total) by mouth daily as needed for erectile dysfunction. 10 tablet 11  . ZETIA 10 MG tablet TAKE 1 TABLET (10 MG TOTAL)  BY MOUTH DAILY. 90 tablet 3   No current facility-administered medications on file prior to visit.     Review of Systems  Constitutional: Negative for unusual diaphoresis or night sweats HENT: Negative for ear swelling or discharge Eyes: Negative for worsening visual haziness  Respiratory: Negative for choking and stridor.   Gastrointestinal: Negative for distension or worsening eructation Genitourinary: Negative for retention or change in urine volume.  Musculoskeletal: Negative for  other MSK pain or swelling Skin: Negative for color change and worsening wound Neurological: Negative for tremors and numbness other than noted  Psychiatric/Behavioral: Negative for decreased concentration or agitation other than above   All other system neg per pt    Objective:   Physical Exam BP 128/78   Pulse 74   Temp 98.2 F (36.8 C) (Oral)   Resp 20   Wt 185 lb 12 oz (84.3 kg)   SpO2 98%   BMI 22.61 kg/m  VS noted,  Constitutional: Pt appears in no apparent distress HENT: Head: NCAT.  Right Ear: External ear normal.  Left Ear: External ear normal.  Eyes: . Pupils are equal, round, and reactive to light. Conjunctivae and EOM are normal Bilat ear canals clear after wax impactions resolved with irrigation Neck: Normal range of motion. Neck supple.  Cardiovascular: Normal rate and regular rhythm.   Pulmonary/Chest: Effort normal and breath sounds without rales or wheezing.  Neurological: Pt is alert. Not confused , motor grossly intact Skin: Skin is warm. No rash, no LE edema, left periscapular lipoma approx 3 cm noted, fatty, NT Psychiatric: Pt behavior is normal. No agitation.   ECG today I have personally interprete Sinus  Bradycardia  -Nonspecific QRS widening.   -  Nonspecific T-abnormality.    Assessment & Plan:

## 2015-12-22 NOTE — Progress Notes (Signed)
Pre visit review using our clinic review tool, if applicable. No additional management support is needed unless otherwise documented below in the visit note. 

## 2015-12-22 NOTE — Patient Instructions (Signed)
Your EKG was OK  Please continue all other medications as before, and refills have been done if requested.  Please have the pharmacy call with any other refills you may need.  Please continue your efforts at being more active, low cholesterol diet, and weight control  Please keep your appointments with your specialists as you may have planned  Please return in 6 months, or sooner if needed, with Lab testing done 3-5 days before

## 2015-12-27 NOTE — Assessment & Plan Note (Signed)
Asympt,  Lab Results  Component Value Date   HGBA1C 4.6 12/16/2015   stable overall by history and exam, recent data reviewed with pt, and pt to continue medical treatment as before,  to f/u any worsening symptoms or concerns

## 2015-12-27 NOTE — Assessment & Plan Note (Signed)
stable overall by history and exam, recent data reviewed with pt, and pt to continue medical treatment as before,  to f/u any worsening symptoms or concerns Lab Results  Component Value Date   LDLCALC 115 (H) 12/16/2015

## 2015-12-27 NOTE — Assessment & Plan Note (Signed)
Asympt, for cardiac risk factor modification, treat medically, ecg reviewed

## 2015-12-27 NOTE — Telephone Encounter (Signed)
email sent to charge correction to remove no show fee °

## 2015-12-27 NOTE — Assessment & Plan Note (Signed)
.  table BP Readings from Last 3 Encounters:  12/22/15 128/78  12/21/15 134/80  08/26/15 130/80

## 2015-12-31 ENCOUNTER — Other Ambulatory Visit: Payer: Self-pay | Admitting: Internal Medicine

## 2016-01-18 ENCOUNTER — Other Ambulatory Visit: Payer: Self-pay | Admitting: Internal Medicine

## 2016-01-28 ENCOUNTER — Ambulatory Visit (INDEPENDENT_AMBULATORY_CARE_PROVIDER_SITE_OTHER)
Admission: RE | Admit: 2016-01-28 | Discharge: 2016-01-28 | Disposition: A | Discharge status: Home / Self Care | Payer: No Typology Code available for payment source | Source: Ambulatory Visit | Attending: Emergency Medicine | Admitting: Emergency Medicine

## 2016-01-28 DIAGNOSIS — R918 Other nonspecific abnormal finding of lung field: Secondary | ICD-10-CM

## 2016-02-10 ENCOUNTER — Other Ambulatory Visit: Payer: Self-pay | Admitting: Internal Medicine

## 2016-02-25 ENCOUNTER — Ambulatory Visit (INDEPENDENT_AMBULATORY_CARE_PROVIDER_SITE_OTHER): Payer: Managed Care, Other (non HMO) | Admitting: Emergency Medicine

## 2016-02-25 ENCOUNTER — Encounter: Payer: Self-pay | Admitting: Emergency Medicine

## 2016-02-25 VITALS — BP 122/68 | HR 63 | Ht 76.0 in | Wt 184.6 lb

## 2016-02-25 DIAGNOSIS — J301 Allergic rhinitis due to pollen: Secondary | ICD-10-CM | POA: Diagnosis not present

## 2016-02-25 DIAGNOSIS — J449 Chronic obstructive pulmonary disease, unspecified: Secondary | ICD-10-CM

## 2016-02-25 DIAGNOSIS — Z720 Tobacco use: Secondary | ICD-10-CM | POA: Diagnosis not present

## 2016-02-25 DIAGNOSIS — R911 Solitary pulmonary nodule: Secondary | ICD-10-CM | POA: Diagnosis not present

## 2016-02-25 LAB — PULMONARY FUNCTION TEST
DL/VA % pred: 69 %
DL/VA: 3.42 ml/min/mmHg/L
DLCO UNC % PRED: 52 %
DLCO UNC: 21.25 ml/min/mmHg
DLCO cor % pred: 52 %
DLCO cor: 21.31 ml/min/mmHg
FEF 25-75 PRE: 1.89 L/s
FEF 25-75 Post: 2.27 L/sec
FEF2575-%Change-Post: 20 %
FEF2575-%PRED-PRE: 56 %
FEF2575-%Pred-Post: 67 %
FEV1-%Change-Post: 4 %
FEV1-%PRED-POST: 87 %
FEV1-%Pred-Pre: 83 %
FEV1-PRE: 3.16 L
FEV1-Post: 3.32 L
FEV1FVC-%Change-Post: 5 %
FEV1FVC-%PRED-PRE: 89 %
FEV6-%CHANGE-POST: 0 %
FEV6-%PRED-POST: 94 %
FEV6-%Pred-Pre: 94 %
FEV6-PRE: 4.47 L
FEV6-Post: 4.48 L
FEV6FVC-%CHANGE-POST: 0 %
FEV6FVC-%PRED-PRE: 101 %
FEV6FVC-%Pred-Post: 101 %
FVC-%Change-Post: 0 %
FVC-%Pred-Post: 92 %
FVC-%Pred-Pre: 93 %
FVC-Post: 4.53 L
FVC-Pre: 4.55 L
POST FEV1/FVC RATIO: 73 %
PRE FEV1/FVC RATIO: 69 %
Post FEV6/FVC ratio: 99 %
Pre FEV6/FVC Ratio: 98 %
RV % PRED: 98 %
RV: 2.6 L
TLC % pred: 95 %
TLC: 7.85 L

## 2016-02-25 NOTE — Progress Notes (Addendum)
Subjective:    Patient ID: Charles [name], male    DOB: [birth year], 66 y.o.   MRN: [id number]  HPI 66 year old former cigarette smoker, THC smoker, PVD, suspected COPD. He began to have some L CP,  Minimal cough in October. CXR 11/5, 12/10 showed right upper lobe infiltrate. He was treated with antibiotics with some resolution of his symptoms. A CT chest 12/30 showed R sided PE and a L LL spiculated nodule with 2 other smaller associated nodules concerning for malignancy. LE dopplers show LLE femoral DVT. He is referred for evaluation of his abnormal CT scan.  6 -- follows up for discussion of his bx results. There were atypical cells present on a single sample, all else negative. We discussed today the pros cons of repeat bx.   ROV 10/02/14 -- follow-up visit for history of tobacco use and suspected COPD, pulmonary embolism diagnosed in 02/18/14 for which he has been treated with Xarelto. He also has left lung nodules including a left lower lobe spiculated nodule for which he underwent bronchoscopy and biopsy in January 2016. The biopsies were negative for malignancy. He has been doing well. Has gained about 15 lbs. He is not having dyspnea, no cough, no hemoptysis. No evidence of bleeding on xarelto.   ROV 10/29/14 -- follow up for COPD, hx PE, a left lower lobe spatulated nodule that was negative for malignancy on bronchoscopy in January 2016. He underwent a repeat CT scan of his chest 9/6 that I have reviewed personally. It shows overall stability of his stellate primary nodules. I believe these are most consistent with scar but will need to be followed for interval change with serial CTs. Still on xarelto, will finish in December.   ROV 03/26/15 -- patient with a history of COPD, pulmonary embolism and left lower lobes. Related nodule that we evaluated with bronchoscopy in 2016. His last CT scan of the chest was 10/27/14, he is due for repeat in March 2017. He Had planned to stop his xarelto in December  2016, but he is still taking it. He hs having some ear fullness. He does not have sneezing or PND.  ROV 2/8/1  ROV 12/21/15 -- this is a follow-up visit for history of COPD, pulmonary emboli now off xarelto. He also has a left lower lobe spiculated nodule that was biopsied in January 2016 and was benign. Follow-up CT scans have been performed, most recently 04/28/15. There is some right upper lobe scar stable over several years, likewise the spatulated left lower lobe area has decreased in size and is more linear, scarlike. His activity is not limited - denies any dyspnea. He does have a lot of nasal congestion, has seen Dr Redmond Baseman, has been on flonase before without much help. He also coughs up mucous every morning.   ROV 01/24/17 -- patient has a history of COPD and pulmonary embolism. He now off anticoagulation. He also has a left lower lobe spiculated nodule that was biopsy negative in January 2016. Subsequent CT scans have been reassuring. Most recent scan was 01/28/16 that showed no significant interval change, I have personally reviewed. Since last visit he has undergone pulmonary function testing on 02/25/16. I have personally reviewed. This shows evidence for mild obstruction without a bronchodilator response, normal lung volumes, decreased diffusion capacity that does not correct when adjusted for alveolar volume.  He denies any resp sx. No cough, no wheeze. He has no breathing or exertional limitations.  He is on ASA via nasal saline. He  has failed flonase.   Review of Systems  Constitutional: Negative for appetite change, fever and unexpected weight change.  HENT: Negative for congestion, dental problem, ear pain, nosebleeds, postnasal drip, rhinorrhea, sinus pressure, sneezing, sore throat and trouble swallowing.   Eyes: Negative for redness and itching.  Respiratory: Negative for cough, chest tightness, shortness of breath and wheezing.   Cardiovascular: Negative for palpitations and leg swelling.   Gastrointestinal: Negative for nausea and vomiting.  Genitourinary: Negative for dysuria.  Musculoskeletal: Negative for joint swelling.  Skin: Negative for rash.  Neurological: Negative for headaches.  Hematological: Does not bruise/bleed easily.  Psychiatric/Behavioral: Negative for dysphoric mood. The patient is not nervous/anxious.     Past Medical History:  Diagnosis Date  . ABNORMAL ELECTROCARDIOGRAM 11/02/2008  . ABSCESS, LUNG 10/23/2006  . Anemia 05/26/2014  . BACTERIAL PNEUMONIA 12/28/2009  . BPH (benign prostatic hyperplasia) 11/22/2010  . 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 CHEST PAIN 12/24/2009  . Coronary artery calcification seen on CT scan 06/01/2015  . Cramp of limb 07/19/2007  . DIVERTICULOSIS, COLON 03/07/2007  . DVT (deep venous thrombosis) ([place]) 01/2014   LLE  . Emphysema of lung ([place])   . 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/23/2006  . FLANK PAIN, LEFT 02/25/2010  . FREQUENCY, URINARY 12/24/2009  . HEMORRHOIDS 10/23/2006  . HYPERLIPIDEMIA 10/23/2006  . HYPERTENSION 10/23/2006  . PE (pulmonary embolism) 02/2014  . PLANTAR FASCIITIS, LEFT 11/02/2008  . Pneumonia 12/2013 X 2  . PSA, INCREASED 11/20/2007  . PULMONARY NODULE 05/14/2008  . Unspecified Peripheral Vascular Disease 10/23/2006     Family History  Problem Relation Age of Onset  . Kidney disease Sister     Renal transplant  . Heart disease Mother   . Heart disease Father   . Colon cancer Neg Hx      Social History   Social History  . Marital status: Married    Spouse name: N/A  . Number of children: 2  . Years of education: N/A   Occupational History  . retired    Social History Main Topics  . Smoking status: Former Smoker    Packs/day: 1.00    Years: 15.00    Types: Cigarettes  . Smokeless tobacco: Never Used     Comment: "quit smoking cigarettes in the 1990's"  . Alcohol use No     Comment: stopped drinking 02/2014, was drinking abou 4 shots per week  . Drug use:     Types: Marijuana     Comment: 03/09/2014  "smoke marijuana q couple days"  . Sexual activity: Yes   Other Topics Concern  . Not on file   Social History Narrative  . No narrative on file     No Known Allergies   Outpatient Medications Prior to Visit  Medication Sig Dispense Refill  . amLODipine (NORVASC) 10 MG tablet TAKE ONE TABLET BY MOUTH ONCE DAILY 90 tablet 0  . aspirin 325 MG tablet Take 325 mg by mouth daily.    Marland Kitchen ezetimibe (ZETIA) 10 MG tablet TAKE ONE TABLET BY MOUTH ONCE DAILY 30 tablet 8  . lisinopril (PRINIVIL,ZESTRIL) 20 MG tablet TAKE ONE TABLET BY MOUTH ONCE DAILY 90 tablet 1  . rosuvastatin (CRESTOR) 40 MG tablet TAKE ONE TABLET BY MOUTH ONCE DAILY 30 tablet 8  . tadalafil (CIALIS) 20 MG tablet Take 1 tablet (20 mg total) by mouth daily as needed for erectile dysfunction. 10 tablet 11   No facility-administered medications prior to visit.  Objective:   Physical Exam Vitals:   02/25/16 1003  BP: 122/68  Pulse: 63  SpO2: 100%  Weight: 184 lb 9.6 oz (83.7 kg)  Height: 6\' 4"  (1.93 m)   Gen: Pleasant, thin, in no distress,  normal affect  ENT: No lesions,  mouth clear,  oropharynx clear, no postnasal drip  Neck: No JVD, no TMG, no carotid bruits  Lungs: No use of accessory muscles, clear without rales or rhonchi  Cardiovascular: RRR, heart sounds normal, no murmur or gallops, no peripheral edema  Musculoskeletal: No deformities, no cyanosis or clubbing  Neuro: alert, non focal  Skin: Warm, no lesions or rashes      Assessment & Plan:  COPD (chronic obstructive pulmonary disease) Mild obstruction noted on his pulmonary function testing from today. He is asymptomatic. We will defer bronchodilators at this time. This will stat which his baseline FEV1.  Allergic rhinitis Continue to use his inhaled aspirin nasal spray Try starting loratadine qd  Multiple nodules of lung Pulmonary nodules on CT scan of the chest. His most recent was viewed today. There is been no interval change.  He needs another scan to complete 2 years of surveillance in September 2018. Wants this has been done he may qualify for lung cancer screening with low-dose CTs.  Baltazar Apo, MD, PhD 02/25/2016, 10:40 AM [facility] [phone] or if no answer [phone]

## 2016-02-25 NOTE — Addendum Note (Signed)
Addended by: Desmond Dike C on: 02/25/2016 10:41 AM   Modules accepted: Orders

## 2016-02-25 NOTE — Assessment & Plan Note (Signed)
Pulmonary nodules on CT scan of the chest. His most recent was viewed today. There is been no interval change. He needs another scan to complete 2 years of surveillance in September 2018. Wants this has been done he may qualify for lung cancer screening with low-dose CTs.

## 2016-02-25 NOTE — Assessment & Plan Note (Signed)
Mild obstruction noted on his pulmonary function testing from today. He is asymptomatic. We will defer bronchodilators at this time. This will stat which his baseline FEV1.

## 2016-02-25 NOTE — Patient Instructions (Addendum)
We will plan to repeat your CT scan of the chest in September 2018, no contrast We will not start inhaled medications at this time. Continue nasal aspirin as you are doing Try starting loratadine 10mg  daily for the next month to see if you benefit.  Follow with Charles [name] in September to review the CT scan, or sooner if you have problems.

## 2016-02-25 NOTE — Assessment & Plan Note (Signed)
Continue to use his inhaled aspirin nasal spray Try starting loratadine qd

## 2016-02-29 ENCOUNTER — Other Ambulatory Visit: Payer: Self-pay | Admitting: General Practice

## 2016-02-29 MED ORDER — LISINOPRIL 20 MG PO TABS: 20.0000 mg | ORAL_TABLET | Freq: Every day | ORAL | 1 refills | Status: DC

## 2016-02-29 MED ORDER — EZETIMIBE 10 MG PO TABS: 10.0000 mg | ORAL_TABLET | Freq: Every day | ORAL | 1 refills | Status: DC

## 2016-02-29 MED ORDER — ROSUVASTATIN CALCIUM 40 MG PO TABS: 40.0000 mg | ORAL_TABLET | Freq: Every day | ORAL | 1 refills | Status: DC

## 2016-04-25 ENCOUNTER — Other Ambulatory Visit (INDEPENDENT_AMBULATORY_CARE_PROVIDER_SITE_OTHER): Payer: Managed Care, Other (non HMO)

## 2016-04-25 ENCOUNTER — Ambulatory Visit (INDEPENDENT_AMBULATORY_CARE_PROVIDER_SITE_OTHER): Payer: Managed Care, Other (non HMO) | Admitting: Internal Medicine

## 2016-04-25 ENCOUNTER — Encounter: Payer: Self-pay | Admitting: Internal Medicine

## 2016-04-25 VITALS — BP 116/86 | HR 98 | Temp 98.6°F | Ht 76.0 in | Wt 176.0 lb

## 2016-04-25 DIAGNOSIS — R310 Gross hematuria: Secondary | ICD-10-CM | POA: Diagnosis not present

## 2016-04-25 DIAGNOSIS — J449 Chronic obstructive pulmonary disease, unspecified: Secondary | ICD-10-CM | POA: Diagnosis not present

## 2016-04-25 DIAGNOSIS — R05 Cough: Secondary | ICD-10-CM | POA: Diagnosis not present

## 2016-04-25 DIAGNOSIS — R059 Cough, unspecified: Secondary | ICD-10-CM

## 2016-04-25 LAB — HEPATIC FUNCTION PANEL
ALK PHOS: 45 U/L (ref 39–117)
ALT: 22 U/L (ref 0–53)
AST: 36 U/L (ref 0–37)
Albumin: 3.4 g/dL — ABNORMAL LOW (ref 3.5–5.2)
BILIRUBIN DIRECT: 0.2 mg/dL (ref 0.0–0.3)
TOTAL PROTEIN: 7 g/dL (ref 6.0–8.3)
Total Bilirubin: 0.7 mg/dL (ref 0.2–1.2)

## 2016-04-25 LAB — BASIC METABOLIC PANEL
BUN: 34 mg/dL — ABNORMAL HIGH (ref 6–23)
CO2: 24 meq/L (ref 19–32)
CREATININE: 2.33 mg/dL — AB (ref 0.40–1.50)
Calcium: 8.5 mg/dL (ref 8.4–10.5)
Chloride: 102 mEq/L (ref 96–112)
GFR: 36.49 mL/min — ABNORMAL LOW (ref >60.00)
Glucose, Bld: 123 mg/dL — ABNORMAL HIGH (ref 70–99)
Potassium: 4 mEq/L (ref 3.5–5.1)
SODIUM: 133 meq/L — AB (ref 135–145)

## 2016-04-25 LAB — CBC WITH DIFFERENTIAL/PLATELET
BASOS ABS: 0 10*3/uL (ref 0.0–0.1)
Basophils Relative: 0.3 % (ref 0.0–3.0)
EOS ABS: 0 10*3/uL (ref 0.0–0.7)
Eosinophils Relative: 0.1 % (ref 0.0–5.0)
HEMATOCRIT: 37.6 % — AB (ref 39.0–52.0)
HEMOGLOBIN: 12.5 g/dL — AB (ref 13.0–17.0)
LYMPHS PCT: 16.3 % (ref 12.0–46.0)
Lymphs Abs: 1.3 10*3/uL (ref 0.7–4.0)
MCHC: 33.3 g/dL (ref 30.0–36.0)
MCV: 95.8 fl (ref 78.0–100.0)
MONOS PCT: 11.3 % (ref 3.0–12.0)
Monocytes Absolute: 0.9 10*3/uL (ref 0.1–1.0)
NEUTROS ABS: 5.7 10*3/uL (ref 1.4–7.7)
Neutrophils Relative %: 72 % (ref 43.0–77.0)
PLATELETS: 109 10*3/uL — AB (ref 150.0–400.0)
RBC: 3.92 Mil/uL — ABNORMAL LOW (ref 4.22–5.81)
RDW: 13.1 % (ref 11.5–15.5)
WBC: 7.9 10*3/uL (ref 4.0–10.5)

## 2016-04-25 MED ORDER — LEVOFLOXACIN 250 MG PO TABS: 250.0000 mg | ORAL_TABLET | Freq: Every day | ORAL | 0 refills | Status: AC

## 2016-04-25 NOTE — Patient Instructions (Signed)
Please take all new medication as prescribed - the antibiotic  You can also take Delsym OTC for cough, and/or Mucinex (or it's generic off brand) for congestion, and tylenol as needed for pain.  Please continue all other medications as before, and refills have been done if requested.  Please have the pharmacy call with any other refills you may need.  Please keep your appointments with your specialists as you may have planned  You will be contacted regarding the referral for: CT scan, and Urology  Please go to the LAB in the Basement (turn left off the elevator) for the tests to be done today  You will be contacted by phone if any changes need to be made immediately.  Otherwise, you will receive a letter about your results with an explanation, but please check with MyChart first.  Please remember to sign up for MyChart if you have not done so, as this will be important to you in the future with finding out test results, communicating by private email, and scheduling acute appointments online when needed.

## 2016-04-25 NOTE — Progress Notes (Signed)
Subjective:    Patient ID: Charles [name], male    DOB: 1950/06/12, 66 y.o.   MRN: [id number]  HPI  Here with episode dark urine, thought it might be blood, only 1 episode as later urination more clear.Denies urinary symptoms such as dysuria, frequency, urgency, flank pain, hematuria or n/v, No pain  Has remote hx years ago in [employer] with cystoscopy.    Has also had low grade temp and cough x 3-4 days.  Takes ASA 325, no other anticoagulant use  Also Here with acute onset mild to mod 2-3 days ST, HA, general weakness and malaise, with prod cough greenish sputum, but Pt denies chest pain, increased sob or doe, wheezing, orthopnea, PND, increased LE swelling, palpitations, dizziness or syncope. Past Medical History:  Diagnosis Date  . ABNORMAL ELECTROCARDIOGRAM 11/02/2008  . ABSCESS, LUNG 10/23/2006  . Anemia 05/26/2014  . BACTERIAL PNEUMONIA 12/28/2009  . BPH (benign prostatic hyperplasia) 11/22/2010  . 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 CHEST PAIN 12/24/2009  . Coronary artery calcification seen on CT scan 06/01/2015  . Cramp of limb 07/19/2007  . DIVERTICULOSIS, COLON 03/07/2007  . DVT (deep venous thrombosis) ([place]) 01/2014   LLE  . Emphysema of lung ([place])   . 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/23/2006  . FLANK PAIN, LEFT 02/25/2010  . FREQUENCY, URINARY 12/24/2009  . HEMORRHOIDS 10/23/2006  . HYPERLIPIDEMIA 10/23/2006  . HYPERTENSION 10/23/2006  . PE (pulmonary embolism) 02/2014  . PLANTAR FASCIITIS, LEFT 11/02/2008  . Pneumonia 12/2013 X 2  . PSA, INCREASED 11/20/2007  . PULMONARY NODULE 05/14/2008  . Unspecified Peripheral Vascular Disease 10/23/2006   Past Surgical History:  Procedure Laterality Date  . HEMORRHOID SURGERY  ?1990's  . INGUINAL HERNIA REPAIR Bilateral ?2000's  . SHOULDER ARTHROSCOPY W/ ROTATOR CUFF REPAIR Right 11/2013  . VIDEO BRONCHOSCOPY WITH ENDOBRONCHIAL NAVIGATION N/A 03/11/2014   Procedure: VIDEO BRONCHOSCOPY WITH ENDOBRONCHIAL NAVIGATION;  Surgeon: Collene Gobble, MD;  Location:  [facility];  Service: Thoracic;  Laterality: N/A;    reports that he has quit smoking. His smoking use included Cigarettes. He has a 15.00 pack-year smoking history. He has never used smokeless tobacco. He reports that he uses drugs, including Marijuana. He reports that he does not drink alcohol. family history includes Heart disease in his father and mother; Kidney disease in his sister. No Known Allergies Current Outpatient Prescriptions on File Prior to Visit  Medication Sig Dispense Refill  . amLODipine (NORVASC) 10 MG tablet TAKE ONE TABLET BY MOUTH ONCE DAILY 90 tablet 0  . aspirin 325 MG tablet Take 325 mg by mouth daily.    Marland Kitchen ezetimibe (ZETIA) 10 MG tablet Take 1 tablet (10 mg total) by mouth daily. 90 tablet 1  . lisinopril (PRINIVIL,ZESTRIL) 20 MG tablet Take 1 tablet (20 mg total) by mouth daily. 90 tablet 1  . rosuvastatin (CRESTOR) 40 MG tablet Take 1 tablet (40 mg total) by mouth daily. 90 tablet 1  . tadalafil (CIALIS) 20 MG tablet Take 1 tablet (20 mg total) by mouth daily as needed for erectile dysfunction. 10 tablet 11   No current facility-administered medications on file prior to visit.    Review of Systems  Constitutional: Negative for unusual diaphoresis or night sweats HENT: Negative for ear swelling or discharge Eyes: Negative for worsening visual haziness  Respiratory: Negative for choking and stridor.   Gastrointestinal: Negative for distension or worsening eructation Genitourinary: Negative for retention or change in urine volume.  Musculoskeletal: Negative for other MSK pain  or swelling Skin: Negative for color change and worsening wound Neurological: Negative for tremors and numbness other than noted  Psychiatric/Behavioral: Negative for decreased concentration or agitation other than above   All other system neg per pt    Objective:   Physical Exam BP 116/86   Pulse 98   Temp 98.6 F (37 C)   Ht 6\' 4"  (1.93 m)   Wt 176 lb (79.8 kg)   SpO2 98%   BMI 21.42  kg/m  VS noted, mild ill Constitutional: Pt appears in no apparent distress HENT: Head: NCAT.  Right Ear: External ear normal.  Left Ear: External ear normal.  Eyes: . Pupils are equal, round, and reactive to light. Conjunctivae and EOM are normal Bilat tm's with mild erythema.  Max sinus areas non tender.  Pharynx with mild erythema, no exudate Neck: Normal range of motion. Neck supple.  Cardiovascular: Normal rate and regular rhythm.   Pulmonary/Chest: Effort normal and breath sounds decreased without rales or wheezing.  Abd:  Soft, NT, ND, + BS Neurological: Pt is alert. Not confused , motor grossly intact Skin: Skin is warm. No rash, no LE edema Psychiatric: Pt behavior is normal. No agitation.  No other exam findings    Assessment & Plan:

## 2016-04-26 ENCOUNTER — Telehealth: Payer: Self-pay | Admitting: Internal Medicine

## 2016-04-26 NOTE — Telephone Encounter (Signed)
Im not sure what condition he is referring to,but it is unlikely to matter, as we cannot prove where the blood is coming from, and we have to assume it might be something serious until proven otherwise, so I would still recommend the CT and the urology referral

## 2016-04-26 NOTE — Telephone Encounter (Signed)
Called pt no answer LMOM w/MD response../lmb 

## 2016-04-26 NOTE — Telephone Encounter (Signed)
Pt called stating he wants to cancel his referrals for the CT and for Urology. He says he has another condition that he has that is the reason.  Can you please give him a call to be sure exactly what this is and see if Dr. Jenny Reichmann is ok with cancelling referrals.

## 2016-04-28 ENCOUNTER — Encounter (HOSPITAL_COMMUNITY): Payer: Self-pay | Admitting: Emergency Medicine

## 2016-04-28 ENCOUNTER — Emergency Department (HOSPITAL_COMMUNITY)
Admission: EM | Admit: 2016-04-28 | Discharge: 2016-04-28 | Disposition: A | Discharge status: Home / Self Care | Payer: Managed Care, Other (non HMO) | Attending: Emergency Medicine | Admitting: Emergency Medicine

## 2016-04-28 DIAGNOSIS — Z7982 Long term (current) use of aspirin: Secondary | ICD-10-CM | POA: Insufficient documentation

## 2016-04-28 DIAGNOSIS — D696 Thrombocytopenia, unspecified: Secondary | ICD-10-CM | POA: Diagnosis not present

## 2016-04-28 DIAGNOSIS — Z8673 Personal history of transient ischemic attack (TIA), and cerebral infarction without residual deficits: Secondary | ICD-10-CM | POA: Insufficient documentation

## 2016-04-28 DIAGNOSIS — I10 Essential (primary) hypertension: Secondary | ICD-10-CM | POA: Insufficient documentation

## 2016-04-28 DIAGNOSIS — J449 Chronic obstructive pulmonary disease, unspecified: Secondary | ICD-10-CM | POA: Insufficient documentation

## 2016-04-28 DIAGNOSIS — R799 Abnormal finding of blood chemistry, unspecified: Secondary | ICD-10-CM | POA: Diagnosis present

## 2016-04-28 DIAGNOSIS — Z79899 Other long term (current) drug therapy: Secondary | ICD-10-CM | POA: Insufficient documentation

## 2016-04-28 DIAGNOSIS — Z87891 Personal history of nicotine dependence: Secondary | ICD-10-CM | POA: Insufficient documentation

## 2016-04-28 DIAGNOSIS — R899 Unspecified abnormal finding in specimens from other organs, systems and tissues: Secondary | ICD-10-CM

## 2016-04-28 LAB — CBC WITH DIFFERENTIAL/PLATELET
Basophils Absolute: 0.1 10*3/uL (ref 0.0–0.1)
Basophils Relative: 1 %
EOS PCT: 2 %
Eosinophils Absolute: 0.1 10*3/uL (ref 0.0–0.7)
HCT: 35.8 % — ABNORMAL LOW (ref 39.0–52.0)
HEMOGLOBIN: 12.1 g/dL — AB (ref 13.0–17.0)
LYMPHS PCT: 35 %
Lymphs Abs: 2 10*3/uL (ref 0.7–4.0)
MCH: 31.7 pg (ref 26.0–34.0)
MCHC: 33.8 g/dL (ref 30.0–36.0)
MCV: 93.7 fL (ref 78.0–100.0)
MONO ABS: 0.8 10*3/uL (ref 0.1–1.0)
MONOS PCT: 13 %
NEUTROS PCT: 49 %
Neutro Abs: 2.8 10*3/uL (ref 1.7–7.7)
PLATELETS: 137 10*3/uL — AB (ref 150–400)
RBC: 3.82 MIL/uL — AB (ref 4.22–5.81)
RDW: 13.2 % (ref 11.5–15.5)
WBC: 5.8 10*3/uL (ref 4.0–10.5)

## 2016-04-28 LAB — BASIC METABOLIC PANEL
ANION GAP: 7 (ref 5–15)
BUN: 17 mg/dL (ref 6–20)
CO2: 20 mmol/L — ABNORMAL LOW (ref 22–32)
Calcium: 8.3 mg/dL — ABNORMAL LOW (ref 8.9–10.3)
Chloride: 111 mmol/L (ref 101–111)
Creatinine, Ser: 1.24 mg/dL (ref 0.61–1.24)
GFR calc Af Amer: >60 mL/min (ref >60)
GLUCOSE: 129 mg/dL — AB (ref 65–99)
POTASSIUM: 3.7 mmol/L (ref 3.5–5.1)
Sodium: 138 mmol/L (ref 135–145)

## 2016-04-28 MED ORDER — SODIUM CHLORIDE 0.9 % IV BOLUS (SEPSIS)
1000.0000 mL | Freq: Once | INTRAVENOUS | Status: AC
  Administered 2016-04-28: 1000 mL via INTRAVENOUS

## 2016-04-28 NOTE — ED Provider Notes (Signed)
[facility] DEPT Provider Note   CSN: [id number] Arrival date & time: 04/28/16  2956     History   Chief Complaint Chief Complaint  Patient presents with  . abnormal lab work    HPI Charles [name] is a 66 y.o. male.  The history is provided by the patient and medical records.    66 year old male with history of anemia, history of cerebellar stroke, history of DVT, hyperlipidemia, hypertension, presenting to the ED for abnormal lab work. Patient reports he saw his primary care doctor a few days ago for follow-up of an upper respiratory infection. Reports he feels significantly better from that standpoint, but had routine lab work drawn. States he still has an intermittent cough, but denies any chest pain or shortness of breath. States he was contacted about his lab work and told that his kidney function was abnormal and he should come to the ED for IV fluids. He denies any abdominal pain or flank pain. Reports he has continued drinking water is normal, but has noticed that he has been urinating less frequently than normal. He was also told to hold his lisinopril which he did this morning.  Past Medical History:  Diagnosis Date  . ABNORMAL ELECTROCARDIOGRAM 11/02/2008  . ABSCESS, LUNG 10/23/2006  . Anemia 05/26/2014  . BACTERIAL PNEUMONIA 12/28/2009  . BPH (benign prostatic hyperplasia) 11/22/2010  . 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 CHEST PAIN 12/24/2009  . Coronary artery calcification seen on CT scan 06/01/2015  . Cramp of limb 07/19/2007  . DIVERTICULOSIS, COLON 03/07/2007  . DVT (deep venous thrombosis) ([place]) 01/2014   LLE  . Emphysema of lung ([place])   . 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/23/2006  . FLANK PAIN, LEFT 02/25/2010  . FREQUENCY, URINARY 12/24/2009  . HEMORRHOIDS 10/23/2006  . HYPERLIPIDEMIA 10/23/2006  . HYPERTENSION 10/23/2006  . PE (pulmonary embolism) 02/2014  . PLANTAR FASCIITIS, LEFT 11/02/2008  . Pneumonia 12/2013 X 2  . PSA, INCREASED 11/20/2007  . PULMONARY NODULE 05/14/2008  .  Unspecified Peripheral Vascular Disease 10/23/2006    Patient Active Problem List   Diagnosis Date Noted  . Gross hematuria 04/25/2016  . UTI (urinary tract infection) 08/26/2015  . Bilateral hearing loss 08/26/2015  . Coronary artery calcification seen on CT scan 06/01/2015  . Low back pain 06/01/2015  . Hyperglycemia 12/18/2014  . Anemia 05/26/2014  . 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 Brain contusion ([place]) 04/23/2014  . Lung nodule 03/09/2014  . Acute pulmonary embolism ([place]) 02/18/2014  . Multiple nodules of lung 02/18/2014  . Cough 12/24/2013  . Allergic rhinitis 12/24/2013  . Other chest pain 12/24/2013  . Increased prostate specific antigen (PSA) velocity 11/20/2013  . Eustachian tube dysfunction 11/20/2013  . Hearing loss on right 11/20/2013  . Serous otitis media 11/15/2013  . Muscle tightness 10/02/2013  . Rotator cuff injury 09/17/2013  . Skin lesion 07/25/2013  . Anal pain 12/18/2012  . Habitual alcohol use 11/17/2012  . Cellulitis and abscess of buttock 11/15/2012  . Cluster headaches 06/07/2012  . BPH (benign prostatic hyperplasia) 11/22/2010  . Abnormal LFTs 11/14/2010  . Right inguinal hernia 09/16/2010  . Nocturia 08/29/2010  . Encounter for preventative adult health care exam with abnormal findings 08/28/2010  . FREQUENCY, URINARY 12/24/2009  . COPD (chronic obstructive pulmonary disease) ([place]) 05/12/2009  . ABNORMAL ELECTROCARDIOGRAM 11/02/2008  . Solitary pulmonary nodule 05/14/2008  . Cramp of limb 07/19/2007  . DIVERTICULOSIS, COLON 03/07/2007  . Hyperlipidemia 10/23/2006  . 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/23/2006  . Essential hypertension 10/23/2006  .  Unspecified Peripheral Vascular Disease 10/23/2006  . HEMORRHOIDS 10/23/2006  . ABSCESS, LUNG 10/23/2006  . GERD 10/23/2006    Past Surgical History:  Procedure Laterality Date  . HEMORRHOID SURGERY  ?1990's  . INGUINAL HERNIA REPAIR Bilateral ?2000's  . SHOULDER ARTHROSCOPY W/ ROTATOR CUFF REPAIR  Right 11/2013  . VIDEO BRONCHOSCOPY WITH ENDOBRONCHIAL NAVIGATION N/A 03/11/2014   Procedure: VIDEO BRONCHOSCOPY WITH ENDOBRONCHIAL NAVIGATION;  Surgeon: Collene Gobble, MD;  Location: [facility];  Service: Thoracic;  Laterality: N/A;       Home Medications    Prior to Admission medications   Medication Sig Start Date End Date Taking? Authorizing Provider  amLODipine (NORVASC) 10 MG tablet TAKE ONE TABLET BY MOUTH ONCE DAILY 01/18/16   Biagio Borg, MD  aspirin 325 MG tablet Take 325 mg by mouth daily.    Historical Provider, MD  ezetimibe (ZETIA) 10 MG tablet Take 1 tablet (10 mg total) by mouth daily. 02/29/16   Biagio Borg, MD  levofloxacin (LEVAQUIN) 250 MG tablet Take 1 tablet (250 mg total) by mouth daily. 04/25/16 05/05/16  Biagio Borg, MD  lisinopril (PRINIVIL,ZESTRIL) 20 MG tablet Take 1 tablet (20 mg total) by mouth daily. 02/29/16   Biagio Borg, MD  rosuvastatin (CRESTOR) 40 MG tablet Take 1 tablet (40 mg total) by mouth daily. 02/29/16   Biagio Borg, MD  tadalafil (CIALIS) 20 MG tablet Take 1 tablet (20 mg total) by mouth daily as needed for erectile dysfunction. 10/07/15   Biagio Borg, MD    Family History Family History  Problem Relation Age of Onset  . Heart disease Mother   . Heart disease Father   . Kidney disease Sister     Renal transplant  . Colon cancer Neg Hx     Social History Social History  Substance Use Topics  . Smoking status: Former Smoker    Packs/day: 1.00    Years: 15.00    Types: Cigarettes  . Smokeless tobacco: Never Used     Comment: "quit smoking cigarettes in the 1990's"  . Alcohol use No     Comment: stopped drinking 02/2014, was drinking abou 4 shots per week     Allergies   Patient has no known allergies.   Review of Systems Review of Systems  Constitutional:       Abnormal labs  All other systems reviewed and are negative.    Physical Exam Updated Vital Signs BP 142/80 (BP Location: Left Arm)   Pulse 92   Temp 97.7 F (36.5 C)  (Oral)   Resp 18   Ht 6\' 4"  (1.93 m)   Wt 80.7 kg   SpO2 100%   BMI 21.67 kg/m   Physical Exam  Constitutional: He is oriented to person, place, and time. He appears well-developed and well-nourished.  Appears well, no distress  HENT:  Head: Normocephalic and atraumatic.  Mouth/Throat: Oropharynx is clear and moist.  Eyes: Conjunctivae and EOM are normal. Pupils are equal, round, and reactive to light.  Neck: Normal range of motion.  Cardiovascular: Normal rate, regular rhythm and normal heart sounds.   Pulmonary/Chest: Effort normal and breath sounds normal. No respiratory distress. He has no wheezes.  Abdominal: Soft. Bowel sounds are normal. There is no tenderness. There is no rebound.  Musculoskeletal: Normal range of motion.  Neurological: He is alert and oriented to person, place, and time.  Skin: Skin is warm and dry.  Psychiatric: He has a normal mood and affect.  Nursing note and vitals reviewed.    ED Treatments / Results  Labs (all labs ordered are listed, but only abnormal results are displayed) Labs Reviewed  CBC WITH DIFFERENTIAL/PLATELET - Abnormal; Notable for the following:       Result Value   RBC 3.82 (*)    Hemoglobin 12.1 (*)    HCT 35.8 (*)    Platelets 137 (*)    All other components within normal limits  BASIC METABOLIC PANEL - Abnormal; Notable for the following:    CO2 20 (*)    Glucose, Bld 129 (*)    Calcium 8.3 (*)    All other components within normal limits    EKG  EKG Interpretation None       Radiology No results found.  Procedures Procedures (including critical care time)  Medications Ordered in ED Medications - No data to display   Initial Impression / Assessment and Plan / ED Course  I have reviewed the triage vital signs and the nursing notes.  Pertinent labs & imaging results that were available during my care of the patient were reviewed by me and considered in my medical decision making (see chart for  details).  66 year old male here with elevated serum creatinine on labs drawn by his PCP. He has no complaints currently.  He is afebrile and nontoxic. Vitals are stable. Repeat lab work today actually with normal serum creatinine and BUN. He was given IV fluids. He does have a noted thrombocytopenia which is chronic. No active bleeding here.  He is tolerating oral fluids well. Feel he is stable for discharge. Will have him follow back up with his primary care doctor to ensure no further abrupt changes in his kidney function.  Discussed plan with patient, he/she acknowledged understanding and agreed with plan of care.  Return precautions given for new or worsening symptoms.  Final Clinical Impressions(s) / ED Diagnoses   Final diagnoses:  Abnormal laboratory test  Thrombocytopenia [facility])    New Prescriptions Discharge Medication List as of 04/28/2016 12:53 PM       Larene Pickett, PA-C 04/28/16 [place], MD 04/28/16 1555

## 2016-04-28 NOTE — ED Notes (Signed)
Pt. Given beverage with EDP approval.

## 2016-04-28 NOTE — Discharge Instructions (Signed)
Your kidney function was back to normal today.  I would resume all of your medications starting tomorrow. Follow-up with Dr. Jenny Reichmann if any ongoing issues. Return here for new concerns.

## 2016-04-28 NOTE — ED Triage Notes (Addendum)
Pt was called this am to have labs redrawn -- was seen at dr John's office yesterday.  Pt's kidney functions are elevated.

## 2016-04-29 NOTE — Assessment & Plan Note (Signed)
Etiology unclear, for urine studies today, also CT abd/pelvis and urology referral,  to f/u any worsening symptoms or concerns

## 2016-04-29 NOTE — Assessment & Plan Note (Signed)
stable overall by history and exam, recent data reviewed with pt, and pt to continue medical treatment as before,  to f/u any worsening symptoms or concerns @LASTSAO2(3)@  

## 2016-04-29 NOTE — Assessment & Plan Note (Addendum)
Mild to mod, c/w bronchitis vs pna,  for antibx course,  Declines cxr, to f/u any worsening symptoms or concerns

## 2016-05-04 ENCOUNTER — Other Ambulatory Visit: Payer: Self-pay | Admitting: Internal Medicine

## 2016-05-04 NOTE — Telephone Encounter (Signed)
Requested Prescriptions   Pending Prescriptions Disp Refills  . amLODipine (NORVASC) 10 MG tablet [Pharmacy Med Name: AMLODIPINE 10MG  TAB] 90 tablet 0    Sig: TAKE ONE TABLET BY MOUTH ONCE DAILY    Last OV 12/22/15.

## 2016-06-21 ENCOUNTER — Ambulatory Visit: Payer: No Typology Code available for payment source | Admitting: Internal Medicine

## 2016-07-23 ENCOUNTER — Inpatient Hospital Stay (HOSPITAL_COMMUNITY)
Admission: AD | Admit: 2016-07-23 | Discharge: 2016-08-08 | DRG: 164 | Disposition: A | Discharge status: Home / Self Care | Payer: Managed Care, Other (non HMO) | Source: Other Acute Inpatient Hospital | Attending: Thoracic Surgery (Cardiothoracic Vascular Surgery) | Admitting: Thoracic Surgery (Cardiothoracic Vascular Surgery)

## 2016-07-23 ENCOUNTER — Inpatient Hospital Stay (HOSPITAL_COMMUNITY): Payer: Managed Care, Other (non HMO)

## 2016-07-23 DIAGNOSIS — Z86718 Personal history of other venous thrombosis and embolism: Secondary | ICD-10-CM

## 2016-07-23 DIAGNOSIS — J942 Hemothorax: Secondary | ICD-10-CM | POA: Diagnosis present

## 2016-07-23 DIAGNOSIS — N183 Chronic kidney disease, stage 3 unspecified: Secondary | ICD-10-CM | POA: Diagnosis present

## 2016-07-23 DIAGNOSIS — N179 Acute kidney failure, unspecified: Secondary | ICD-10-CM | POA: Diagnosis present

## 2016-07-23 DIAGNOSIS — Z79899 Other long term (current) drug therapy: Secondary | ICD-10-CM | POA: Diagnosis not present

## 2016-07-23 DIAGNOSIS — I10 Essential (primary) hypertension: Secondary | ICD-10-CM | POA: Diagnosis present

## 2016-07-23 DIAGNOSIS — J9382 Other air leak: Secondary | ICD-10-CM | POA: Diagnosis present

## 2016-07-23 DIAGNOSIS — J449 Chronic obstructive pulmonary disease, unspecified: Secondary | ICD-10-CM | POA: Diagnosis present

## 2016-07-23 DIAGNOSIS — N184 Chronic kidney disease, stage 4 (severe): Secondary | ICD-10-CM | POA: Diagnosis present

## 2016-07-23 DIAGNOSIS — I2699 Other pulmonary embolism without acute cor pulmonale: Principal | ICD-10-CM

## 2016-07-23 DIAGNOSIS — J9811 Atelectasis: Secondary | ICD-10-CM | POA: Diagnosis present

## 2016-07-23 DIAGNOSIS — K5900 Constipation, unspecified: Secondary | ICD-10-CM | POA: Diagnosis present

## 2016-07-23 DIAGNOSIS — R58 Hemorrhage, not elsewhere classified: Secondary | ICD-10-CM

## 2016-07-23 DIAGNOSIS — E785 Hyperlipidemia, unspecified: Secondary | ICD-10-CM | POA: Diagnosis present

## 2016-07-23 DIAGNOSIS — Z419 Encounter for procedure for purposes other than remedying health state, unspecified: Secondary | ICD-10-CM

## 2016-07-23 DIAGNOSIS — I82422 Acute embolism and thrombosis of left iliac vein: Secondary | ICD-10-CM | POA: Diagnosis present

## 2016-07-23 DIAGNOSIS — S062X9A Diffuse traumatic brain injury with loss of consciousness of unspecified duration, initial encounter: Secondary | ICD-10-CM

## 2016-07-23 DIAGNOSIS — J9311 Primary spontaneous pneumothorax: Secondary | ICD-10-CM | POA: Diagnosis not present

## 2016-07-23 DIAGNOSIS — I82409 Acute embolism and thrombosis of unspecified deep veins of unspecified lower extremity: Secondary | ICD-10-CM | POA: Diagnosis present

## 2016-07-23 DIAGNOSIS — I2510 Atherosclerotic heart disease of native coronary artery without angina pectoris: Secondary | ICD-10-CM | POA: Diagnosis present

## 2016-07-23 DIAGNOSIS — I130 Hypertensive heart and chronic kidney disease with heart failure and stage 1 through stage 4 chronic kidney disease, or unspecified chronic kidney disease: Secondary | ICD-10-CM | POA: Diagnosis present

## 2016-07-23 DIAGNOSIS — S062XAA Diffuse traumatic brain injury with loss of consciousness status unknown, initial encounter: Secondary | ICD-10-CM

## 2016-07-23 DIAGNOSIS — R042 Hemoptysis: Secondary | ICD-10-CM | POA: Diagnosis present

## 2016-07-23 DIAGNOSIS — D62 Acute posthemorrhagic anemia: Secondary | ICD-10-CM | POA: Diagnosis not present

## 2016-07-23 DIAGNOSIS — I509 Heart failure, unspecified: Secondary | ICD-10-CM | POA: Diagnosis present

## 2016-07-23 DIAGNOSIS — J9383 Other pneumothorax: Secondary | ICD-10-CM

## 2016-07-23 DIAGNOSIS — Z09 Encounter for follow-up examination after completed treatment for conditions other than malignant neoplasm: Secondary | ICD-10-CM

## 2016-07-23 DIAGNOSIS — R609 Edema, unspecified: Secondary | ICD-10-CM

## 2016-07-23 DIAGNOSIS — Z4682 Encounter for fitting and adjustment of non-vascular catheter: Secondary | ICD-10-CM

## 2016-07-23 DIAGNOSIS — J939 Pneumothorax, unspecified: Secondary | ICD-10-CM

## 2016-07-23 DIAGNOSIS — J439 Emphysema, unspecified: Secondary | ICD-10-CM

## 2016-07-23 LAB — MRSA PCR SCREENING: MRSA by PCR: NEGATIVE

## 2016-07-23 LAB — APTT: aPTT: >200 seconds (ref 24–36)

## 2016-07-23 LAB — TROPONIN I: TROPONIN I: 0.04 ng/mL — AB (ref <0.03)

## 2016-07-23 LAB — PROTIME-INR
INR: 1.25
Prothrombin Time: 15.8 seconds — ABNORMAL HIGH (ref 11.4–15.2)

## 2016-07-23 MED ORDER — SODIUM CHLORIDE 0.9% FLUSH
3.0000 mL | Freq: Two times a day (BID) | INTRAVENOUS | Status: DC
  Administered 2016-07-23 – 2016-07-30 (×12): 3 mL via INTRAVENOUS

## 2016-07-23 MED ORDER — ASPIRIN 325 MG PO TABS: 325.0000 mg | ORAL_TABLET | Freq: Every day | ORAL | Status: DC

## 2016-07-23 MED ORDER — HYDRALAZINE HCL 20 MG/ML IJ SOLN: 5.0000 mg | INTRAMUSCULAR | Status: DC | PRN

## 2016-07-23 MED ORDER — EZETIMIBE 10 MG PO TABS
10.0000 mg | ORAL_TABLET | Freq: Every day | ORAL | Status: DC
  Administered 2016-07-24 – 2016-08-08 (×15): 10 mg via ORAL
  Filled 2016-07-23 (×15): qty 1

## 2016-07-23 MED ORDER — ALBUTEROL SULFATE (2.5 MG/3ML) 0.083% IN NEBU: 2.5000 mg | INHALATION_SOLUTION | RESPIRATORY_TRACT | Status: DC | PRN

## 2016-07-23 MED ORDER — HEPARIN (PORCINE) IN NACL 100-0.45 UNIT/ML-% IJ SOLN
1100.0000 [IU]/h | INTRAMUSCULAR | Status: DC
  Administered 2016-07-23: 1100 [IU]/h via INTRAVENOUS
  Filled 2016-07-23: qty 250

## 2016-07-23 MED ORDER — ROSUVASTATIN CALCIUM 40 MG PO TABS
40.0000 mg | ORAL_TABLET | Freq: Every day | ORAL | Status: DC
  Administered 2016-07-24 – 2016-08-07 (×15): 40 mg via ORAL
  Filled 2016-07-23 (×2): qty 2
  Filled 2016-07-23 (×4): qty 1
  Filled 2016-07-23: qty 2
  Filled 2016-07-23 (×6): qty 1
  Filled 2016-07-23: qty 2
  Filled 2016-07-23: qty 1

## 2016-07-23 MED ORDER — IPRATROPIUM BROMIDE 0.02 % IN SOLN: 0.5000 mg | RESPIRATORY_TRACT | Status: DC | PRN

## 2016-07-23 MED ORDER — IPRATROPIUM BROMIDE 0.02 % IN SOLN
0.5000 mg | Freq: Four times a day (QID) | RESPIRATORY_TRACT | Status: DC
  Administered 2016-07-23: 0.5 mg via RESPIRATORY_TRACT
  Filled 2016-07-23: qty 2.5

## 2016-07-23 MED ORDER — ONDANSETRON HCL 4 MG/2ML IJ SOLN: 4.0000 mg | Freq: Four times a day (QID) | INTRAMUSCULAR | Status: DC | PRN

## 2016-07-23 MED ORDER — HEPARIN BOLUS VIA INFUSION
4000.0000 [IU] | Freq: Once | INTRAVENOUS | Status: AC
  Administered 2016-07-23: 4000 [IU] via INTRAVENOUS
  Filled 2016-07-23: qty 4000

## 2016-07-23 MED ORDER — AMLODIPINE BESYLATE 10 MG PO TABS
10.0000 mg | ORAL_TABLET | Freq: Every day | ORAL | Status: DC
  Administered 2016-07-24 – 2016-08-08 (×15): 10 mg via ORAL
  Filled 2016-07-23 (×15): qty 1

## 2016-07-23 MED ORDER — WHITE PETROLATUM GEL
Status: AC
  Administered 2016-07-23: 23:00:00
  Filled 2016-07-23: qty 1

## 2016-07-23 MED ORDER — ONDANSETRON HCL 4 MG PO TABS: 4.0000 mg | ORAL_TABLET | Freq: Four times a day (QID) | ORAL | Status: DC | PRN

## 2016-07-23 MED ORDER — ACETAMINOPHEN 650 MG RE SUPP: 650.0000 mg | Freq: Four times a day (QID) | RECTAL | Status: DC | PRN

## 2016-07-23 MED ORDER — ACETAMINOPHEN 325 MG PO TABS: 650.0000 mg | ORAL_TABLET | Freq: Four times a day (QID) | ORAL | Status: DC | PRN

## 2016-07-23 MED ORDER — ZOLPIDEM TARTRATE 5 MG PO TABS
5.0000 mg | ORAL_TABLET | Freq: Every evening | ORAL | Status: DC | PRN
  Administered 2016-07-23 – 2016-07-28 (×4): 5 mg via ORAL
  Filled 2016-07-23 (×4): qty 1

## 2016-07-23 MED ORDER — OXYCODONE-ACETAMINOPHEN 5-325 MG PO TABS
1.0000 | ORAL_TABLET | ORAL | Status: DC | PRN
  Administered 2016-07-24: 1 via ORAL
  Filled 2016-07-23: qty 1

## 2016-07-23 MED ORDER — DM-GUAIFENESIN ER 30-600 MG PO TB12: 1.0000 | ORAL_TABLET | Freq: Two times a day (BID) | ORAL | Status: DC | PRN

## 2016-07-23 NOTE — Progress Notes (Addendum)
CRITICAL VALUE ALERT  Critical Value:  Troponin 0.04  Date & Time Notied:  07/23/16  2248  Provider Notified: On call MD Opyd  Orders Received/Actions taken: No orders at this moment. MD advised to monitor and trend troponin. Will continue to monitor.

## 2016-07-23 NOTE — H&P (Addendum)
History and Physical    Charles [name]:[id number] DOB: [birth year] DOA: 07/23/2016  Referring MD/NP/PA:   PCP: Biagio Borg, MD   Patient coming from:  The patient is coming from home.  At baseline, pt is independent for most of ADL.   Chief Complaint: SOB  HPI: Charles [name] is a 66 y.o. male with medical history significant of hypertension, hyperlipidemia, COPD, stroke, PE 2016 and DVT 2015 not on anticoagulants, PVD, CAD, BPH, chronic kidney disease-stage III, who presents with shortness breath.  Patient states that he had history of DVT and PE which were treated with Xarelto. As instructed by PCP, he stoped taking Xarelto 8 months ago. He states that that he developed SOB, which has been progressively getting worse. He does not have chest pain, fever or chills. No calf tenderness. He has mild cough with clear mucus production. Patient denies nausea, vomiting, diarrhea, abdominal pain, symptoms of UTI. No leg edema. Pt was seen in ED of [place]. CT angiogram of the chest showed bilateral segmental and subsegmental pulmonary embolism, also showed large partially loculated right pneumothorax. Right sided chest tube was placed in ED. Pt states that his shortness breath has improved after chest tube is placed. He speaks in full sentence. Chest x-ray showed partial resolution of right humeral thorax after chest tube is placed, no infiltration. IV heparin was started in ED.  ED Course: pt was found to have  WBC 7.8, worsening renal function, chest x-ray. At arrival to floor,  blood pressure 159/93, tachycardia, tachypnea, oxygen saturation 100% on 2 L nasal cannula oxygen, temperature 90.9. Patient is admitted to stepdown as inpatient.  Review of Systems:   General: no fevers, chills, no changes in body weight, has poor appetite, has fatigue HEENT: no blurry vision, hearing changes or sore throat Respiratory: has dyspnea, coughing, no wheezing CV: no chest pain, no palpitations GI: no  nausea, vomiting, abdominal pain, diarrhea, constipation GU: no dysuria, burning on urination, increased urinary frequency, hematuria  Ext: no leg edema Neuro: no unilateral weakness, numbness, or tingling, no vision change or hearing loss Skin: no rash, no skin tear. MSK: No muscle spasm, no deformity, no limitation of range of movement in spin Heme: No easy bruising.  Travel history: No recent long distant travel.  Allergy: No Known Allergies  Past Medical History:  Diagnosis Date  . ABNORMAL ELECTROCARDIOGRAM 11/02/2008  . ABSCESS, LUNG 10/23/2006  . Anemia 05/26/2014  . BACTERIAL PNEUMONIA 12/28/2009  . BPH (benign prostatic hyperplasia) 11/22/2010  . 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 CHEST PAIN 12/24/2009  . Coronary artery calcification seen on CT scan 06/01/2015  . Cramp of limb 07/19/2007  . DIVERTICULOSIS, COLON 03/07/2007  . DVT (deep venous thrombosis) ([place]) 01/2014   LLE  . Emphysema of lung ([place])   . 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/23/2006  . FLANK PAIN, LEFT 02/25/2010  . FREQUENCY, URINARY 12/24/2009  . HEMORRHOIDS 10/23/2006  . HYPERLIPIDEMIA 10/23/2006  . HYPERTENSION 10/23/2006  . PE (pulmonary embolism) 02/2014  . PLANTAR FASCIITIS, LEFT 11/02/2008  . Pneumonia 12/2013 X 2  . PSA, INCREASED 11/20/2007  . PULMONARY NODULE 05/14/2008  . Unspecified Peripheral Vascular Disease 10/23/2006    Past Surgical History:  Procedure Laterality Date  . HEMORRHOID SURGERY  ?1990's  . INGUINAL HERNIA REPAIR Bilateral ?2000's  . SHOULDER ARTHROSCOPY W/ ROTATOR CUFF REPAIR Right 11/2013  . VIDEO BRONCHOSCOPY WITH ENDOBRONCHIAL NAVIGATION N/A 03/11/2014   Procedure: VIDEO BRONCHOSCOPY WITH ENDOBRONCHIAL NAVIGATION;  Surgeon: Collene Gobble, MD;  Location:  [facility] OR;  Service: Thoracic;  Laterality: N/A;    Social History:  reports that he has quit smoking. His smoking use included Cigarettes. He has a 15.00 pack-year smoking history. He has never used smokeless tobacco. He reports that he uses drugs,  including Marijuana. He reports that he does not drink alcohol.  Family History:  Family History  Problem Relation Age of Onset  . Heart disease Mother   . Heart disease Father   . Kidney disease Sister        Renal transplant  . Colon cancer Neg Hx      Prior to Admission medications   Medication Sig Start Date End Date Taking? Authorizing Provider  amLODipine (NORVASC) 10 MG tablet TAKE ONE TABLET BY MOUTH ONCE DAILY 05/04/16   Biagio Borg, MD  aspirin 325 MG tablet Take 325 mg by mouth daily.    [provider]  ezetimibe (ZETIA) 10 MG tablet Take 1 tablet (10 mg total) by mouth daily. 02/29/16   Biagio Borg, MD  lisinopril (PRINIVIL,ZESTRIL) 20 MG tablet Take 1 tablet (20 mg total) by mouth daily. 02/29/16   Biagio Borg, MD  rosuvastatin (CRESTOR) 40 MG tablet Take 1 tablet (40 mg total) by mouth daily. 02/29/16   Biagio Borg, MD  tadalafil (CIALIS) 20 MG tablet Take 1 tablet (20 mg total) by mouth daily as needed for erectile dysfunction. 10/07/15   Biagio Borg, MD    Physical Exam: Vitals:   07/23/16 2115 07/23/16 2332  BP: (!) 159/93 (!) 157/83  Pulse: (!) 114 70  Resp: (!) 24 19  Temp: 99 F (37.2 C) 98.3 F (36.8 C)  TempSrc: Oral Oral  SpO2: 100% 100%  Weight: 77 kg (169 lb 12.1 oz)   Height: 6\' 4"  (1.93 m)    General: Not in acute distress HEENT:       Eyes: PERRL, EOMI, no scleral icterus.       ENT: No discharge from the ears and nose, no pharynx injection, no tonsillar enlargement.        Neck: No JVD, no bruit, no mass felt. Heme: No neck lymph node enlargement. Cardiac: S1/S2, RRR, No murmurs, No gallops or rubs. Respiratory: Decreased air movement bilaterally. No rales, wheezing, rhonchi or rubs. Right sided chest tube is in place. GI: Soft, nondistended, nontender, no rebound pain, no organomegaly, BS present. GU: No hematuria Ext: No pitting leg edema bilaterally. 2+DP/PT pulse bilaterally. Musculoskeletal: No joint deformities, No joint  redness or warmth, no limitation of ROM in spin. Skin: No rashes.  Neuro: Alert, oriented X3, cranial nerves II-XII grossly intact, moves all extremities normally. Psych: Patient is not psychotic, no suicidal or hemocidal ideation.  Labs on Admission: I have personally reviewed following labs and imaging studies  CBC: No results for input(s): WBC, NEUTROABS, HGB, HCT, MCV, PLT in the last 168 hours. Basic Metabolic Panel: No results for input(s): NA, K, CL, CO2, GLUCOSE, BUN, CREATININE, CALCIUM, MG, PHOS in the last 168 hours. GFR: CrCl cannot be calculated (Patient's most recent lab result is older than the maximum 21 days allowed.). Liver Function Tests: No results for input(s): AST, ALT, ALKPHOS, BILITOT, PROT, ALBUMIN in the last 168 hours. No results for input(s): LIPASE, AMYLASE in the last 168 hours. No results for input(s): AMMONIA in the last 168 hours. Coagulation Profile:  Recent Labs Lab 07/23/16 2156  INR 1.25   Cardiac Enzymes:  Recent Labs Lab 07/23/16 2156  TROPONINI 0.04*  BNP (last 3 results) No results for input(s): PROBNP in the last 8760 hours. HbA1C: No results for input(s): HGBA1C in the last 72 hours. CBG: No results for input(s): GLUCAP in the last 168 hours. Lipid Profile: No results for input(s): CHOL, HDL, LDLCALC, TRIG, CHOLHDL, LDLDIRECT in the last 72 hours. Thyroid Function Tests: No results for input(s): TSH, T4TOTAL, FREET4, T3FREE, THYROIDAB in the last 72 hours. Anemia Panel: No results for input(s): VITAMINB12, FOLATE, FERRITIN, TIBC, IRON, RETICCTPCT in the last 72 hours. Urine analysis:    Component Value Date/Time   COLORURINE YELLOW 06/24/2015 [place] 06/24/2015 0907   LABSPEC 1.020 06/24/2015 0907   PHURINE 6.0 06/24/2015 0907   GLUCOSEU NEGATIVE 06/24/2015 0907   HGBUR SMALL (A) 06/24/2015 0907   BILIRUBINUR negative 08/26/2015 0858   KETONESUR NEGATIVE 06/24/2015 0907   PROTEINUR negative 08/26/2015  0858   UROBILINOGEN negative 08/26/2015 0858   UROBILINOGEN 0.2 06/24/2015 0907   NITRITE negative 08/26/2015 0858   NITRITE POSITIVE (A) 06/24/2015 0907   LEUKOCYTESUR Negative 08/26/2015 0858   Sepsis Labs: @LABRCNTIP (procalcitonin:4,lacticidven:4) ) Recent Results (from the past 240 hour(s))  MRSA PCR Screening     Status: None   Collection Time: 07/23/16  9:31 PM  Result Value Ref Range Status   MRSA by PCR NEGATIVE NEGATIVE Final    Comment:        The GeneXpert MRSA Assay (FDA approved for NASAL specimens only), is one component of a comprehensive MRSA colonization surveillance program. It is not intended to diagnose MRSA infection nor to guide or monitor treatment for MRSA infections.      Radiological Exams on Admission: Dg Chest Port 1 View  Result Date: 07/23/2016 CLINICAL DATA:  Chest tube placement.  Pneumothorax. EXAM: PORTABLE CHEST 1 VIEW COMPARISON:  Chest CT 01/28/2016 FINDINGS: The heart size and mediastinal contours are within normal limits. There is a right-sided chest tube with tip projecting over the medial aspect of the right lung base and mid T12 vertebra. No pneumothorax or midline shift. Postprocedural induration of the adjacent right lung base laterally. Emphysematous hyperinflation of the lungs bilaterally. No pulmonary consolidation or effusion. The left costophrenic angle is excluded The visualized skeletal structures are unremarkable. IMPRESSION: Post procedural change involving the right lower lobe laterally with right-sided chest tube seen with tip projecting over the medial right lung base and lower thoracic spine. No radiographically apparent pneumothorax or midline shift. Electronically Signed   By: Ashley Royalty M.D.   On: 07/23/2016 22:07     EKG:  Not done in ED, will get one.   Assessment/Plan Principal Problem:   Acute pulmonary embolism (HCC) Active Problems:   Hyperlipidemia   Essential hypertension   COPD (chronic obstructive  pulmonary disease) (HCC)   Coronary artery calcification seen on CT scan   Pneumothorax on right   PE (pulmonary thromboembolism) (HCC)   Acute renal failure superimposed on stage 3 chronic kidney disease ([place])   Addendum: pt was found to ooze blood from chest tube site and has soft  palpable hematoma at chest tube insertion site by Nurse. Stat CBC showed Hgb dropped from 12.7 to 11.7. Blood pressure is stable. Oxygen saturation 100% on 2 L nasal cannula oxygen -will temporarily stop IV heparin and reevaluate in the morning -Discontinue aspirin -type/screen  Acute pulmonary embolism [facility]): CT angiogram of the chest showed bilateral segmental and subsegmental pulmonary embolism. This is recurrent issue, likely need to take anticoagulants lifelong. Currently patient is hemodynamically stable. No chest  pain  -admit to stepdown for close monitoring overnight -heparin drip initiated -2D echocardiogram ordered -LE dopplers ordered to evaluate for DVT -repeat EKG in a.m. -trop x 3 -pain control: When necessary Percocet  Pneumothorax on right: s/p of chest tube placement. Shortness of breath has improved. -repeat CXR now  HLD: -crestor and Zetia  HTN:  -hold lisinopril due to worsening renal function -Continue amlodipine -IV hydralazine when necessary  COPD: stable -Atrovent nebs and prn albuterol nebs  Coronary artery calcification seen on CT scan: No CP -continue ASA and crestor, zetia  AoCKD-III: Baseline Cre is 1.1-1.2, pt's Cre is 1.65, BUN 25 on admission. Likely due to prerenal secondary to dehydration and continuation of ACEI and NSAIDs.  - IV: NS 75 cc/h - Check FeNa  - Follow up renal function by BMP - Hold lisinopril and naproxen  DVT ppx: on IV Heparin Code Status: Full code Family Communication:  Yes, patient's wife and son   at bed side Disposition Plan:  Anticipate discharge back to previous home environment Consults called:  none Admission status:   SDU/inpation       Date of Service 07/24/2016    Ivor Costa [facility] Pager [phone]  If 7PM-7AM, please contact night-coverage [email or web address] Password [email or web address] 07/24/2016, 12:36 AM

## 2016-07-23 NOTE — Progress Notes (Signed)
CRITICAL VALUE ALERT  Critical Value:  aptt greater than 200  Date & Time Notied:  07/23/16  2325  Provider Notified: On call MD Opyd  Orders Received/Actions taken: No new orders at this time. Will continue to monitor.

## 2016-07-23 NOTE — Progress Notes (Signed)
ANTICOAGULATION CONSULT NOTE - Initial Consult  Pharmacy Consult for Heparin Indication: pulmonary embolus  No Known Allergies  Patient Measurements: Height: 6\' 4"  (193 cm) Weight: 169 lb 12.1 oz (77 kg) IBW/kg (Calculated) : 86.8  Vital Signs: Temp: 99 F (37.2 C) (06/03 2115) Temp Source: Oral (06/03 2115) BP: 159/93 (06/03 2115) Pulse Rate: 114 (06/03 2115)  Labs: No results for input(s): HGB, HCT, PLT, APTT, LABPROT, INR, HEPARINUNFRC, HEPRLOWMOCWT, CREATININE, CKTOTAL, CKMB, TROPONINI in the last 72 hours.  CrCl cannot be calculated (Patient's most recent lab result is older than the maximum 21 days allowed.).   Medical History: Past Medical History:  Diagnosis Date  . ABNORMAL ELECTROCARDIOGRAM 11/02/2008  . ABSCESS, LUNG 10/23/2006  . Anemia 05/26/2014  . BACTERIAL PNEUMONIA 12/28/2009  . BPH (benign prostatic hyperplasia) 11/22/2010  . 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 CHEST PAIN 12/24/2009  . Coronary artery calcification seen on CT scan 06/01/2015  . Cramp of limb 07/19/2007  . DIVERTICULOSIS, COLON 03/07/2007  . DVT (deep venous thrombosis) ([place]) 01/2014   LLE  . Emphysema of lung ([place])   . 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/23/2006  . FLANK PAIN, LEFT 02/25/2010  . FREQUENCY, URINARY 12/24/2009  . HEMORRHOIDS 10/23/2006  . HYPERLIPIDEMIA 10/23/2006  . HYPERTENSION 10/23/2006  . PE (pulmonary embolism) 02/2014  . PLANTAR FASCIITIS, LEFT 11/02/2008  . Pneumonia 12/2013 X 2  . PSA, INCREASED 11/20/2007  . PULMONARY NODULE 05/14/2008  . Unspecified Peripheral Vascular Disease 10/23/2006    Assessment: 66 year old male to begin heparin for rule out pulmonary embolus   Goal of Therapy:  Heparin level 0.3-0.7 units/ml Monitor platelets by anticoagulation protocol: Yes   Plan:  Heparin 4000 units iv bolus x 1 Heparin drip at 1100 units / hr Heparin level and CBC daily  Thank you Anette Guarneri, PharmD [phone]  Tad Moore 07/23/2016,9:47 PM

## 2016-07-24 ENCOUNTER — Inpatient Hospital Stay (HOSPITAL_COMMUNITY): Payer: Managed Care, Other (non HMO)

## 2016-07-24 ENCOUNTER — Encounter (HOSPITAL_COMMUNITY): Payer: Managed Care, Other (non HMO)

## 2016-07-24 ENCOUNTER — Encounter (HOSPITAL_COMMUNITY): Payer: Self-pay | Admitting: *Deleted

## 2016-07-24 DIAGNOSIS — N179 Acute kidney failure, unspecified: Secondary | ICD-10-CM

## 2016-07-24 DIAGNOSIS — J9311 Primary spontaneous pneumothorax: Secondary | ICD-10-CM

## 2016-07-24 DIAGNOSIS — I5030 Unspecified diastolic (congestive) heart failure: Secondary | ICD-10-CM

## 2016-07-24 DIAGNOSIS — R58 Hemorrhage, not elsewhere classified: Secondary | ICD-10-CM

## 2016-07-24 DIAGNOSIS — I2699 Other pulmonary embolism without acute cor pulmonale: Secondary | ICD-10-CM

## 2016-07-24 DIAGNOSIS — N183 Chronic kidney disease, stage 3 (moderate): Secondary | ICD-10-CM

## 2016-07-24 DIAGNOSIS — D62 Acute posthemorrhagic anemia: Secondary | ICD-10-CM

## 2016-07-24 LAB — RAPID URINE DRUG SCREEN, HOSP PERFORMED
Amphetamines: NOT DETECTED
BARBITURATES: NOT DETECTED
Benzodiazepines: NOT DETECTED
Cocaine: NOT DETECTED
Opiates: NOT DETECTED
TETRAHYDROCANNABINOL: POSITIVE — AB

## 2016-07-24 LAB — BASIC METABOLIC PANEL
ANION GAP: 9 (ref 5–15)
BUN: 17 mg/dL (ref 6–20)
CHLORIDE: 111 mmol/L (ref 101–111)
CO2: 20 mmol/L — AB (ref 22–32)
CREATININE: 1.29 mg/dL — AB (ref 0.61–1.24)
Calcium: 8.4 mg/dL — ABNORMAL LOW (ref 8.9–10.3)
GFR calc non Af Amer: 57 mL/min — ABNORMAL LOW (ref >60)
Glucose, Bld: 136 mg/dL — ABNORMAL HIGH (ref 65–99)
POTASSIUM: 4 mmol/L (ref 3.5–5.1)
SODIUM: 140 mmol/L (ref 135–145)

## 2016-07-24 LAB — ECHOCARDIOGRAM COMPLETE
E/e' ratio: 5.13
EWDT: 375 ms
FS: 29 % (ref 28–44)
HEIGHTINCHES: 76 in
IVS/LV PW RATIO, ED: 0.97
LA diam end sys: 36 mm
LA vol A4C: 43 ml
LADIAMINDEX: 1.78 cm/m2
LASIZE: 36 mm
LAVOL: 55.6 mL
LAVOLIN: 27.5 mL/m2
LV E/e' medial: 5.13
LV PW d: 9.24 mm — AB (ref 0.6–1.1)
LV TDI E'MEDIAL: 4.87
LV e' LATERAL: 7.87 cm/s
LVEEAVG: 5.13
LVOT SV: 73 mL
LVOT VTI: 16.1 cm
LVOT area: 4.52 cm2
LVOT peak vel: 79.9 cm/s
LVOTD: 24 mm
Lateral S' vel: 13.1 cm/s
MV Dec: 375
MV pk E vel: 40.4 m/s
MVPKAVEL: 40.1 m/s
TAPSE: 26.1 mm
TDI e' lateral: 7.87
Weight: 2719.59 oz

## 2016-07-24 LAB — TYPE AND SCREEN
ABO/RH(D): B POS
ANTIBODY SCREEN: NEGATIVE

## 2016-07-24 LAB — CBC
HCT: 33.8 % — ABNORMAL LOW (ref 39.0–52.0)
HEMATOCRIT: 32 % — AB (ref 39.0–52.0)
HEMOGLOBIN: 11.7 g/dL — AB (ref 13.0–17.0)
HEMOGLOBIN: 10.7 g/dL — AB (ref 13.0–17.0)
MCH: 32.8 pg (ref 26.0–34.0)
MCH: 31.6 pg (ref 26.0–34.0)
MCHC: 34.6 g/dL (ref 30.0–36.0)
MCHC: 33.4 g/dL (ref 30.0–36.0)
MCV: 94.7 fL (ref 78.0–100.0)
MCV: 94.4 fL (ref 78.0–100.0)
PLATELETS: 104 10*3/uL — AB (ref 150–400)
Platelets: 108 10*3/uL — ABNORMAL LOW (ref 150–400)
RBC: 3.57 MIL/uL — AB (ref 4.22–5.81)
RBC: 3.39 MIL/uL — ABNORMAL LOW (ref 4.22–5.81)
RDW: 13.8 % (ref 11.5–15.5)
RDW: 13.6 % (ref 11.5–15.5)
WBC: 9.7 10*3/uL (ref 4.0–10.5)
WBC: 9.4 10*3/uL (ref 4.0–10.5)

## 2016-07-24 LAB — BRAIN NATRIURETIC PEPTIDE: B Natriuretic Peptide: 165.6 pg/mL — ABNORMAL HIGH (ref 0.0–100.0)

## 2016-07-24 LAB — TROPONIN I
Troponin I: 0.04 ng/mL (ref <0.03)
Troponin I: 0.04 ng/mL (ref <0.03)

## 2016-07-24 LAB — GLUCOSE, CAPILLARY: Glucose-Capillary: 115 mg/dL — ABNORMAL HIGH (ref 65–99)

## 2016-07-24 LAB — ABO/RH: ABO/RH(D): B POS

## 2016-07-24 LAB — CREATININE, URINE, RANDOM: CREATININE, URINE: 71.29 mg/dL

## 2016-07-24 LAB — SODIUM, URINE, RANDOM: Sodium, Ur: 102 mmol/L

## 2016-07-24 LAB — HIV ANTIBODY (ROUTINE TESTING W REFLEX): HIV Screen 4th Generation wRfx: NONREACTIVE

## 2016-07-24 MED ORDER — HEPARIN (PORCINE) IN NACL 100-0.45 UNIT/ML-% IJ SOLN
1000.0000 [IU]/h | INTRAMUSCULAR | Status: DC
  Administered 2016-07-25: 1000 [IU]/h via INTRAVENOUS

## 2016-07-24 MED ORDER — NAPROXEN SODIUM 275 MG PO TABS
440.0000 mg | ORAL_TABLET | Freq: Every day | ORAL | Status: DC | PRN
Start: 2016-07-24 — End: 2016-07-24

## 2016-07-24 MED ORDER — ORAL CARE MOUTH RINSE
15.0000 mL | Freq: Two times a day (BID) | OROMUCOSAL | Status: DC
  Administered 2016-07-25 – 2016-07-30 (×9): 15 mL via OROMUCOSAL

## 2016-07-24 MED ORDER — SODIUM CHLORIDE 0.9 % IV SOLN
INTRAVENOUS | Status: DC
  Administered 2016-07-24 (×2): via INTRAVENOUS

## 2016-07-24 MED ORDER — OXYCODONE-ACETAMINOPHEN 5-325 MG PO TABS
1.0000 | ORAL_TABLET | ORAL | Status: DC | PRN
  Administered 2016-07-24 (×3): 2 via ORAL
  Administered 2016-07-24: 1 via ORAL
  Administered 2016-07-25 – 2016-07-30 (×21): 2 via ORAL
  Filled 2016-07-24 (×7): qty 2
  Filled 2016-07-24: qty 1
  Filled 2016-07-24 (×17): qty 2

## 2016-07-24 NOTE — Progress Notes (Signed)
      [address]       [place] [address]             [phone]     Asked to see patient for bleeding around chest catheter site. Dressing last changed at 7am (approx). Minimal blood on current dressing. Observed Chest tube site for several minutes and currently there is no active bleeding. Heparin is on hold currently. Will not place suture at this time.   Toria [place] E, PA-C

## 2016-07-24 NOTE — Progress Notes (Signed)
  Echocardiogram 2D Echocardiogram has been performed.  Johny Chess 07/24/2016, 10:32 AM

## 2016-07-24 NOTE — Progress Notes (Signed)
ANTICOAGULATION CONSULT NOTE - Initial Consult  Pharmacy Consult for Heparin Indication: pulmonary embolus  No Known Allergies  Patient Measurements: Height: 6\' 4"  (193 cm) Weight: 169 lb 15.6 oz (77.1 kg) IBW/kg (Calculated) : 86.8  Assessment: 66 year old male started on heparin gtt for possible PE. Patient was bleeding from chest tube overnight so heparin stopped. MD reordered heparin to start at midnight, will lower heparin goal.  Goal of Therapy:  Heparin level 0.3-0.5 units/ml Monitor platelets by anticoagulation protocol: Yes   Plan:  Restart heparin at 1000 units/hr at midnight CBC/HL in am  Erin Hearing PharmD., BCPS Clinical Pharmacist Pager [phone] 07/24/2016 5:39 PM

## 2016-07-24 NOTE — Progress Notes (Signed)
*  PRELIMINARY RESULTS* Vascular Ultrasound Lower extremity venous duplex has been completed.  Preliminary findings: DVT noted in the Left common femoral, profunda femoral, femoral, and popliteal veins. No DVT RLE.  Iliac veins were evaluated: appears to be mobile DVT in the left distal external iliac vein. Difficult to image more proximally but appears to be patent. No apparent DVT in the RLE. Difficult to visualize iliac veins and cannot visualize IVC due to overlying bowel gas.     Paged Dr. [place] Pink with results.    Landry Mellow, RDMS, RVT  07/24/2016, 12:16 PM

## 2016-07-24 NOTE — Care Management Note (Addendum)
Case Management Note  Patient Details  Name: Charles [name] MRN: [id number] Date of Birth: 02/09/1951  Subjective/Objective:    From home with spouse, presents from [facility] in [place] with sob, ptx, bil p.e.'s, per cxr ptx is increased, chest tube has bad air leak. Heparin drip stopped, for u/s on legs today, chest tube site is edematous.  He has [employer].   PTA indep.  6/5 Tomi Bamberger RN, BSN - NCM received call from [facility] from [facility], stating to call her if we need help with any dc needs her phone is [phone] xt [phone].  She states the patient has 28 days of snf benefits, and  And unlimited ltach benefits, as long as meet medical necessity.  The [facility] agency in network with patient's insurance are [place], Sherilyn Cooter, [facility], and [facility].    6/8 [place], BSN - ptx, bil pe's-  patient conts with chest tube to suction, ptx worse, conts   on heparin drip.  NCM awaiting benefit check for eliquis and xarelto.    6/15 [place], BSN - right hemothorax, s/p redo VATS thoracoscopy, evacuation of hemothorax.   6/18 [place], BSN- conts with one chest tube to water seal, ivc filter.  Per previous pt eval rec HHPT , walker and 3 n 1.  He chose [facility] from [facility] List , referral made to [place] with [facility] for HHPT and rolling walker, he does not want the 3 n 1.  Will need orders.   6/19 [place], BSN - patient states he does not think he needs HHPT, so he is declining this and he states they have a rolling walker at home after checking with his wife.  He is for dc today.   No anticoagulation due to bleeding issues.  NCM was notified by [facility] also, of patients decline of services.   PCP Cathlean Cower               Action/Plan: NCM will follow for dc needs.  Expected Discharge Date:                  Expected Discharge Plan:  Home/Self Care  In-House Referral:     Discharge  planning Services  CM Consult  Post Acute Care Choice:    Choice offered to:     DME Arranged:    DME Agency:     HH Arranged:    HH Agency:     Status of Service:  In process, will continue to follow  If discussed at Long Length of Stay Meetings, dates discussed:    Additional Comments:  Zenon Mayo, RN 07/24/2016, 12:35 PM

## 2016-07-24 NOTE — Progress Notes (Signed)
Pt chest tube dressing and bed sheet soiled. Soft palpable hematoma at chest tube insertion site. Chest tube site slowly oozing blood. Pt requesting additional pain medication. MD Opyd paged and came to bedside. Heparin gtt stopped for now and reevaluate in AM. Percocet changed from 1 tab to 1-2 tabs q4H.  Will continue to monitor.

## 2016-07-24 NOTE — Progress Notes (Signed)
[facility] PROGRESS NOTE  Charles [name] RSW:[id number] DOB: 1951-01-26 DOA: 07/23/2016  PCP: Biagio Borg, MD  Brief History/Interval Summary: 66 year old African-American male with a past medical history of hypertension, hyperlipidemia, COPD, stroke, history of PE in 2016, but no longer on anticoagulation, presented with shortness of breath. He was evaluated at an outside facility. Evaluation revealed bilateral pulmonary embolism but also showed right-sided pneumothorax. A chest tube was placed, he was started on heparin and the patient was transferred to this hospital. Overnight, the chest tube site started bleeding. Heparin was placed on hold.  Reason for Visit: Bilateral pulmonary embolism. Right-sided pneumothorax  Consultants: CTS  Procedures: Chest tube placement at the ED  Antibiotics: None  Subjective/Interval History: Patient feels better. Asking for something to eat. Right-sided chest pain has improved. Denies any shortness of breath this morning. Denies any nausea, vomiting.  ROS: No headaches  Objective:  Vital Signs  Vitals:   07/23/16 2332 07/24/16 0339 07/24/16 0401 07/24/16 0740  BP: (!) 157/83 139/82  138/82  Pulse: 70 71  (!) 37  Resp: 19 19  16   Temp: 98.3 F (36.8 C) 98.2 F (36.8 C)  97.7 F (36.5 C)  TempSrc: Oral Oral  Oral  SpO2: 100% 100%  100%  Weight:   77.1 kg (169 lb 15.6 oz)   Height:        Intake/Output Summary (Last 24 hours) at 07/24/16 0944 Last data filed at 07/24/16 0644  Gross per 24 hour  Intake           187.69 ml  Output              585 ml  Net          -397.31 ml   Filed Weights   07/23/16 2115 07/24/16 0401  Weight: 77 kg (169 lb 12.1 oz) 77.1 kg (169 lb 15.6 oz)    General appearance: alert, cooperative, appears stated age and no distress Resp: Diminished air entry at the right side. Crackles noted bilaterally at the bases. Chest tube is noted in the right chest. Slow oozing of blood noted from the past  tube site. Cardio: regular rate and rhythm, S1, S2 normal, no murmur, click, rub or gallop GI: soft, non-tender; bowel sounds normal; no masses,  no organomegaly Extremities: extremities normal, atraumatic, no cyanosis or edema Neurologic: No focal deficits  Lab Results:  Data Reviewed: I have personally reviewed following labs and imaging studies  CBC:  Recent Labs Lab 07/24/16 0326  WBC 9.7  HGB 11.7*  HCT 33.8*  MCV 94.7  PLT 104*    Basic Metabolic Panel:  Recent Labs Lab 07/24/16 0326  NA 140  K 4.0  CL 111  CO2 20*  GLUCOSE 136*  BUN 17  CREATININE 1.29*  CALCIUM 8.4*    GFR: Estimated Creatinine Clearance: 63.9 mL/min (A) (by C-G formula based on SCr of 1.29 mg/dL (H)).  Coagulation Profile:  Recent Labs Lab 07/23/16 2156  INR 1.25    Cardiac Enzymes:  Recent Labs Lab 07/23/16 2156 07/24/16 0326  TROPONINI 0.04* 0.04*    CBG:  Recent Labs Lab 07/24/16 0739  GLUCAP 115*     Recent Results (from the past 240 hour(s))  MRSA PCR Screening     Status: None   Collection Time: 07/23/16  9:31 PM  Result Value Ref Range Status   MRSA by PCR NEGATIVE NEGATIVE Final    Comment:        The GeneXpert MRSA  Assay (FDA approved for NASAL specimens only), is one component of a comprehensive MRSA colonization surveillance program. It is not intended to diagnose MRSA infection nor to guide or monitor treatment for MRSA infections.       Radiology Studies: Dg Chest Port 1 View  Result Date: 07/24/2016 CLINICAL DATA:  Bleeding around RIGHT side chest tube EXAM: PORTABLE CHEST 1 VIEW COMPARISON:  Portable exam 0833 hours compared to 07/23/2016 FINDINGS: Small bore RIGHT basilar thoracostomy tube again seen. Stable heart size, mediastinal contours and pulmonary vascularity. Atherosclerotic calcification aorta. Minimal atelectasis or developing infiltrate at RIGHT base. Subtle extrapleural density versus loculated effusion at lateral RIGHT base,  potentially related to chest tube insertion. Upper lungs clear. Small RIGHT apical pneumothorax slightly increased. Question few blebs at RIGHT apex. No pleural effusion. Bones demineralized with scattered degenerative disc disease changes thoracic spine. IMPRESSION: Small RIGHT apex pneumothorax, slightly increased. Developing atelectasis versus infiltrate at RIGHT base. Aortic atherosclerosis. Findings called to Norva Karvonen RN on [facility] on 07/24/2016 at 0844 hrs. Electronically Signed   By: Lavonia Dana M.D.   On: 07/24/2016 08:44   Dg Chest Port 1 View  Result Date: 07/23/2016 CLINICAL DATA:  Chest tube placement.  Pneumothorax. EXAM: PORTABLE CHEST 1 VIEW COMPARISON:  Chest CT 01/28/2016 FINDINGS: The heart size and mediastinal contours are within normal limits. There is a right-sided chest tube with tip projecting over the medial aspect of the right lung base and mid T12 vertebra. No pneumothorax or midline shift. Postprocedural induration of the adjacent right lung base laterally. Emphysematous hyperinflation of the lungs bilaterally. No pulmonary consolidation or effusion. The left costophrenic angle is excluded The visualized skeletal structures are unremarkable. IMPRESSION: Post procedural change involving the right lower lobe laterally with right-sided chest tube seen with tip projecting over the medial right lung base and lower thoracic spine. No radiographically apparent pneumothorax or midline shift. Electronically Signed   By: Ashley Royalty M.D.   On: 07/23/2016 22:07     Medications:  Scheduled: . amLODipine  10 mg Oral Daily  . ezetimibe  10 mg Oral Daily  . [START ON 07/25/2016] mouth rinse  15 mL Mouth Rinse BID  . rosuvastatin  40 mg Oral q1800  . sodium chloride flush  3 mL Intravenous Q12H   Continuous: . sodium chloride 75 mL/hr at 07/24/16 0401  . heparin Stopped (07/24/16 0220)   PFX:[id number] **OR** acetaminophen, albuterol, dextromethorphan-guaiFENesin, hydrALAZINE,  ipratropium, ondansetron **OR** ondansetron (ZOFRAN) IV, oxyCODONE-acetaminophen, zolpidem  Assessment/Plan:  Principal Problem:   Acute pulmonary embolism (HCC) Active Problems:   Hyperlipidemia   Essential hypertension   COPD (chronic obstructive pulmonary disease) (HCC)   Coronary artery calcification seen on CT scan   Pneumothorax on right   PE (pulmonary thromboembolism) ([place])   Acute renal failure superimposed on stage 3 chronic kidney disease ([place])    Acute pulmonary embolism Detected on CT scan done at the outside facility. This is patient's second episode of PE. Initial episode was diagnosed in December 2015. According to the patient that episode occurred in the setting of multiple pneumonias as a result of which he was quite sedentary. He was taken off of anticoagulation after 1 year. Denies any road trips or travel recently. Reason for this current episode of PE is not entirely clear. He will need further workup as an outpatient. Patient was started on IV heparin but then he started bleeding into the chest tube site. Heparin is on hold currently. Continue to monitor patient on telemetry.  Echocardiogram and lower extremity Doppler pending.  Right-sided pneumothorax with bleeding complication Patient does have a history of COPD. Noted to have emphysema on previous CT scans. Could have had blebs, which could've resulted in less pneumothorax. Overnight, patient started bleeding from the chest tube site. Possibly related to anticoagulation. Heparin is on hold. Repeat hemoglobin this afternoon. Discussed with Dr. Roxan Hockey with cardiothoracic surgery, who will evaluate patient. He recommends holding heparin for now. Patient is currently stable.  Anemia secondary to acute blood loss Monitor hemoglobin closely. Transfuse as needed.  Acute on chronic kidney disease stage III. Baseline creatinine about 1.1-1.2. It was 1.6 at hospitalization and 1.29 today. Monitor urine output. Continue  gentle IV hydration.  History of COPD Appears to be stable. Continue nebulizer treatments. Chest x-ray this morning shows small pneumothorax with concern raised for atelectasis versus infiltrate. He is afebrile and with normal WBC. This is likely not an infectious process. Most likely atelectasis.  History of essential hypertension. Holding lisinopril due to elevated creatinine. Continue amlodipine. Hydralazine as needed.  DVT Prophylaxis: He was on IV heparin, currently on hold. If lower extremity Doppler study is negative for DVT, we will use SCDs.    Code Status: Full code  Family Communication: Discussed with the patient  Disposition Plan: Management as outlined above    LOS: 1 day   [facility] Pager [phone] 07/24/2016, 9:44 AM  If 7PM-7AM, please contact night-coverage at [email or web address], password [facility]

## 2016-07-24 NOTE — Progress Notes (Signed)
ANTICOAGULATION CONSULT NOTE - Initial Consult  Pharmacy Consult for Heparin Indication: pulmonary embolus  No Known Allergies  Patient Measurements: Height: 6\' 4"  (193 cm) Weight: 169 lb 15.6 oz (77.1 kg) IBW/kg (Calculated) : 86.8  Assessment: 66 year old male started on heparin gtt for possible PE. Patient was bleeding from chest tube overnight so heparin stopped. MD stated risk of bleed is more than risk of possible PE. Will re-assess later. Hgb 11.7, plts 104.   Goal of Therapy:  Heparin level 0.3-0.7 units/ml Monitor platelets by anticoagulation protocol: Yes   Plan:  Continue to hold heparin gtt F/U heparin restart per CVTS  Elenor Quinones, PharmD, BCPS Clinical Pharmacist Pager [phone] 07/24/2016 3:18 PM

## 2016-07-24 NOTE — Consult Note (Signed)
Reason for Consult: Bleeding from chest tube site Referring Physician: Dr. Maryland Pink- [facility]/ Primary Cathlean Cower, MD  Charles [name] is an 66 y.o. male.  HPI: Charles [name] is a 66 yo man with a history of tobacco abuse, COPD, hypertension, hyperlipidemia, DVT, PE, pneumonia, lung abscess, cerebellar stroke and stage III CKD. He presented to the ED in [facility] yesterday with shortness of breath. He denied chest pain, fever or leg swelling. He has a history of PE in 2016 and stopped taking Xarelto about 8 months ago. A CT chest showed a right pneumothorax and bilateral segmental and subsegmental PEs. A catheter was placed into the right chest and he was started on a heparin drip. Overnight there was bleeding around the catheter insertion site and heparin was placed on hold. His dressing was changed multiple times.   He currently is sitting bed and denies CP and SOB. Chest catheter is out of chest.  Past Medical History:  Diagnosis Date  . ABNORMAL ELECTROCARDIOGRAM 11/02/2008  . ABSCESS, LUNG 10/23/2006  . Anemia 05/26/2014  . BACTERIAL PNEUMONIA 12/28/2009  . BPH (benign prostatic hyperplasia) 11/22/2010  . 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 CHEST PAIN 12/24/2009  . Coronary artery calcification seen on CT scan 06/01/2015  . Cramp of limb 07/19/2007  . DIVERTICULOSIS, COLON 03/07/2007  . DVT (deep venous thrombosis) ([place]) 01/2014   LLE  . Emphysema of lung ([place])   . 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/23/2006  . FLANK PAIN, LEFT 02/25/2010  . FREQUENCY, URINARY 12/24/2009  . HEMORRHOIDS 10/23/2006  . HYPERLIPIDEMIA 10/23/2006  . HYPERTENSION 10/23/2006  . PE (pulmonary embolism) 02/2014  . PLANTAR FASCIITIS, LEFT 11/02/2008  . Pneumonia 12/2013 X 2  . PSA, INCREASED 11/20/2007  . PULMONARY NODULE 05/14/2008  . Unspecified Peripheral Vascular Disease 10/23/2006    Past Surgical History:  Procedure Laterality Date  . HEMORRHOID SURGERY  ?1990's  . INGUINAL HERNIA REPAIR Bilateral ?2000's  . SHOULDER  ARTHROSCOPY W/ ROTATOR CUFF REPAIR Right 11/2013  . VIDEO BRONCHOSCOPY WITH ENDOBRONCHIAL NAVIGATION N/A 03/11/2014   Procedure: VIDEO BRONCHOSCOPY WITH ENDOBRONCHIAL NAVIGATION;  Surgeon: Collene Gobble, MD;  Location: [facility] OR;  Service: Thoracic;  Laterality: N/A;    Family History  Problem Relation Age of Onset  . Heart disease Mother   . Heart disease Father   . Kidney disease Sister        Renal transplant  . Colon cancer Neg Hx     Social History:  reports that he has quit smoking. His smoking use included Cigarettes. He has a 15.00 pack-year smoking history. He has never used smokeless tobacco. He reports that he uses drugs, including Marijuana. He reports that he does not drink alcohol.  Allergies: No Known Allergies  Medications:  Prior to Admission:  Prescriptions Prior to Admission  Medication Sig Dispense Refill Last Dose  . amLODipine (NORVASC) 10 MG tablet TAKE ONE TABLET BY MOUTH ONCE DAILY 90 tablet 0 07/23/2016 at am  . aspirin 325 MG tablet Take 325 mg by mouth daily.   07/23/2016 at am  . ezetimibe (ZETIA) 10 MG tablet Take 1 tablet (10 mg total) by mouth daily. 90 tablet 1 07/23/2016 at am  . lisinopril (PRINIVIL,ZESTRIL) 20 MG tablet Take 1 tablet (20 mg total) by mouth daily. 90 tablet 1 07/23/2016 at am  . naproxen sodium (ALEVE) 220 MG tablet Take 440 mg by mouth daily as needed (pain).   07/23/2016 at am  . rosuvastatin (CRESTOR) 40 MG tablet Take 1 tablet (40  mg total) by mouth daily. 90 tablet 1 07/23/2016 at am  . tadalafil (CIALIS) 20 MG tablet Take 1 tablet (20 mg total) by mouth daily as needed for erectile dysfunction. 10 tablet 11 2 weeks ago    Results for orders placed or performed during the hospital encounter of 07/23/16 (from the past 48 hour(s))  MRSA PCR Screening     Status: None   Collection Time: 07/23/16  9:31 PM  Result Value Ref Range   MRSA by PCR NEGATIVE NEGATIVE    Comment:        The GeneXpert MRSA Assay (FDA approved for NASAL  specimens only), is one component of a comprehensive MRSA colonization surveillance program. It is not intended to diagnose MRSA infection nor to guide or monitor treatment for MRSA infections.   Troponin I (q 6hr x 3)     Status: Abnormal   Collection Time: 07/23/16  9:56 PM  Result Value Ref Range   Troponin I 0.04 (HH) <0.03 ng/mL    Comment: CRITICAL RESULT CALLED TO, READ BACK BY AND VERIFIED WITH: PHILLIPS,K RN 07/23/2016 2249 JORDANS   Protime-INR     Status: Abnormal   Collection Time: 07/23/16  9:56 PM  Result Value Ref Range   Prothrombin Time 15.8 (H) 11.4 - 15.2 seconds   INR 1.25   APTT     Status: Abnormal   Collection Time: 07/23/16  9:56 PM  Result Value Ref Range   aPTT >200 (HH) 24 - 36 seconds    Comment:        IF BASELINE aPTT IS ELEVATED, SUGGEST PATIENT RISK ASSESSMENT BE USED TO DETERMINE APPROPRIATE ANTICOAGULANT THERAPY. REPEATED TO VERIFY CRITICAL RESULT CALLED TO, READ BACK BY AND VERIFIED WITH: D.EVERETT,RN 2323 07/23/16 [email or web address]   Rapid urine drug screen (hospital performed)     Status: Abnormal   Collection Time: 07/23/16 11:24 PM  Result Value Ref Range   Opiates NONE DETECTED NONE DETECTED   Cocaine NONE DETECTED NONE DETECTED   Benzodiazepines NONE DETECTED NONE DETECTED   Amphetamines NONE DETECTED NONE DETECTED   Tetrahydrocannabinol POSITIVE (A) NONE DETECTED   Barbiturates NONE DETECTED NONE DETECTED    Comment:        DRUG SCREEN FOR MEDICAL PURPOSES ONLY.  IF CONFIRMATION IS NEEDED FOR ANY PURPOSE, NOTIFY LAB WITHIN 5 DAYS.        LOWEST DETECTABLE LIMITS FOR URINE DRUG SCREEN Drug Class       Cutoff (ng/mL) Amphetamine      1000 Barbiturate      200 Benzodiazepine   650 Tricyclics       354 Opiates          300 Cocaine          300 THC              50   Creatinine, urine, random     Status: None   Collection Time: 07/23/16 11:24 PM  Result Value Ref Range   Creatinine, Urine 71.29 mg/dL  Sodium, urine, random      Status: None   Collection Time: 07/23/16 11:24 PM  Result Value Ref Range   Sodium, Ur 102 mmol/L  Type and screen [place]     Status: None   Collection Time: 07/24/16  3:23 AM  Result Value Ref Range   ABO/RH(D) B POS    Antibody Screen NEG    Sample Expiration 07/27/2016   ABO/Rh     Status: None  Collection Time: 07/24/16  3:23 AM  Result Value Ref Range   ABO/RH(D) B POS   Troponin I (q 6hr x 3)     Status: Abnormal   Collection Time: 07/24/16  3:26 AM  Result Value Ref Range   Troponin I 0.04 (HH) <0.03 ng/mL    Comment: CRITICAL VALUE NOTED.  VALUE IS CONSISTENT WITH PREVIOUSLY REPORTED AND CALLED VALUE.  Brain natriuretic peptide     Status: Abnormal   Collection Time: 07/24/16  3:26 AM  Result Value Ref Range   B Natriuretic Peptide 165.6 (H) 0.0 - 100.0 pg/mL  Basic metabolic panel     Status: Abnormal   Collection Time: 07/24/16  3:26 AM  Result Value Ref Range   Sodium 140 135 - 145 mmol/L   Potassium 4.0 3.5 - 5.1 mmol/L   Chloride 111 101 - 111 mmol/L   CO2 20 (L) 22 - 32 mmol/L   Glucose, Bld 136 (H) 65 - 99 mg/dL   BUN 17 6 - 20 mg/dL   Creatinine, Ser 1.29 (H) 0.61 - 1.24 mg/dL   Calcium 8.4 (L) 8.9 - 10.3 mg/dL   GFR calc non Af Amer 57 (L) >60 mL/min   GFR calc Af Amer >60 >60 mL/min    Comment: (NOTE) The eGFR has been calculated using the CKD EPI equation. This calculation has not been validated in all clinical situations. eGFR's persistently <60 mL/min signify possible Chronic Kidney Disease.    Anion gap 9 5 - 15  CBC     Status: Abnormal   Collection Time: 07/24/16  3:26 AM  Result Value Ref Range   WBC 9.7 4.0 - 10.5 K/uL   RBC 3.57 (L) 4.22 - 5.81 MIL/uL   Hemoglobin 11.7 (L) 13.0 - 17.0 g/dL   HCT 33.8 (L) 39.0 - 52.0 %   MCV 94.7 78.0 - 100.0 fL   MCH 32.8 26.0 - 34.0 pg   MCHC 34.6 30.0 - 36.0 g/dL   RDW 13.8 11.5 - 15.5 %   Platelets 104 (L) 150 - 400 K/uL    Comment: PLATELET COUNT CONFIRMED BY SMEAR  Glucose,  capillary     Status: Abnormal   Collection Time: 07/24/16  7:39 AM  Result Value Ref Range   Glucose-Capillary 115 (H) 65 - 99 mg/dL  Troponin I (q 6hr x 3)     Status: Abnormal   Collection Time: 07/24/16  8:52 AM  Result Value Ref Range   Troponin I 0.04 (HH) <0.03 ng/mL    Comment: CRITICAL VALUE NOTED.  VALUE IS CONSISTENT WITH PREVIOUSLY REPORTED AND CALLED VALUE.    Dg Chest Port 1 View  Result Date: 07/24/2016 CLINICAL DATA:  Bleeding around RIGHT side chest tube EXAM: PORTABLE CHEST 1 VIEW COMPARISON:  Portable exam 0833 hours compared to 07/23/2016 FINDINGS: Small bore RIGHT basilar thoracostomy tube again seen. Stable heart size, mediastinal contours and pulmonary vascularity. Atherosclerotic calcification aorta. Minimal atelectasis or developing infiltrate at RIGHT base. Subtle extrapleural density versus loculated effusion at lateral RIGHT base, potentially related to chest tube insertion. Upper lungs clear. Small RIGHT apical pneumothorax slightly increased. Question few blebs at RIGHT apex. No pleural effusion. Bones demineralized with scattered degenerative disc disease changes thoracic spine. IMPRESSION: Small RIGHT apex pneumothorax, slightly increased. Developing atelectasis versus infiltrate at RIGHT base. Aortic atherosclerosis. Findings called to Norva Karvonen RN on [facility] on 07/24/2016 at 0844 hrs. Electronically Signed   By: Lavonia Dana M.D.   On: 07/24/2016 08:44  Dg Chest Port 1 View  Result Date: 07/23/2016 CLINICAL DATA:  Chest tube placement.  Pneumothorax. EXAM: PORTABLE CHEST 1 VIEW COMPARISON:  Chest CT 01/28/2016 FINDINGS: The heart size and mediastinal contours are within normal limits. There is a right-sided chest tube with tip projecting over the medial aspect of the right lung base and mid T12 vertebra. No pneumothorax or midline shift. Postprocedural induration of the adjacent right lung base laterally. Emphysematous hyperinflation of the lungs bilaterally. No  pulmonary consolidation or effusion. The left costophrenic angle is excluded The visualized skeletal structures are unremarkable. IMPRESSION: Post procedural change involving the right lower lobe laterally with right-sided chest tube seen with tip projecting over the medial right lung base and lower thoracic spine. No radiographically apparent pneumothorax or midline shift. Electronically Signed   By: Ashley Royalty M.D.   On: 07/23/2016 22:07    Review of Systems  Constitutional: Negative for chills and fever.  Respiratory: Positive for shortness of breath. Negative for cough.   Cardiovascular: Negative for chest pain and PND.  Neurological: Negative for seizures and loss of consciousness.   Blood pressure (!) 152/86, pulse 66, temperature 98 F (36.7 C), temperature source Oral, resp. rate 15, height _0  (1.93 m), weight 169 lb 15.6 oz (77.1 kg), SpO2 100 %. Physical Exam  Vitals reviewed. Constitutional: He is oriented to person, place, and time. He appears well-developed and well-nourished. No distress.  HENT:  Head: Normocephalic and atraumatic.  Mouth/Throat: No oropharyngeal exudate.  Eyes: Conjunctivae and EOM are normal. No scleral icterus.  Neck: Neck supple. No thyromegaly present.  Cardiovascular: Normal rate, regular rhythm and normal heart sounds.   Respiratory: Effort normal. No respiratory distress.  Diminished BS on right. Hematoma right chest wall with minimal oozing from Catheter site. Catheter displaced  GI: Soft. He exhibits no distension. There is no tenderness.  Musculoskeletal: He exhibits no edema.  Lymphadenopathy:    He has no cervical adenopathy.  Neurological: He is alert and oriented to person, place, and time. No cranial nerve deficit.  Skin: Skin is warm and dry.    Assessment/Plan: 66 yo man who presented with shortness of breath and was found to have recurrent PE and a new spontaneous right pneumothorax. A chest catheter was placed at the ED in  [place], which reinflated the lung but became dislodged sometime in the interim before I saw him. He bled from the catheter insertion site and has a sizable hematoma at the site. There is no evidence of bleeding into the chest on the CXR and no active bleeding presently.   Heparin on hold. Would restart after 12 hours.  Will check CXr to see if right pneumothorax has recurred. If so may need a new CT at a different site.  Melrose Nakayama 07/24/2016, 2:19 PM

## 2016-07-25 ENCOUNTER — Inpatient Hospital Stay (HOSPITAL_COMMUNITY): Payer: Managed Care, Other (non HMO)

## 2016-07-25 ENCOUNTER — Encounter (HOSPITAL_COMMUNITY): Payer: Self-pay | Admitting: *Deleted

## 2016-07-25 DIAGNOSIS — J449 Chronic obstructive pulmonary disease, unspecified: Secondary | ICD-10-CM

## 2016-07-25 DIAGNOSIS — J9311 Primary spontaneous pneumothorax: Secondary | ICD-10-CM

## 2016-07-25 DIAGNOSIS — I82409 Acute embolism and thrombosis of unspecified deep veins of unspecified lower extremity: Secondary | ICD-10-CM | POA: Diagnosis present

## 2016-07-25 DIAGNOSIS — I82412 Acute embolism and thrombosis of left femoral vein: Secondary | ICD-10-CM

## 2016-07-25 LAB — BASIC METABOLIC PANEL
ANION GAP: 7 (ref 5–15)
Anion gap: 6 (ref 5–15)
BUN: 10 mg/dL (ref 6–20)
BUN: 9 mg/dL (ref 6–20)
CALCIUM: 8.3 mg/dL — AB (ref 8.9–10.3)
CHLORIDE: 107 mmol/L (ref 101–111)
CO2: 23 mmol/L (ref 22–32)
CO2: 25 mmol/L (ref 22–32)
CREATININE: 1.22 mg/dL (ref 0.61–1.24)
Calcium: 8.2 mg/dL — ABNORMAL LOW (ref 8.9–10.3)
Chloride: 106 mmol/L (ref 101–111)
Creatinine, Ser: 1.2 mg/dL (ref 0.61–1.24)
GFR calc non Af Amer: >60 mL/min (ref >60)
GFR calc non Af Amer: >60 mL/min (ref >60)
Glucose, Bld: 98 mg/dL (ref 65–99)
Glucose, Bld: 98 mg/dL (ref 65–99)
POTASSIUM: 4.1 mmol/L (ref 3.5–5.1)
Potassium: 3.8 mmol/L (ref 3.5–5.1)
SODIUM: 137 mmol/L (ref 135–145)
SODIUM: 137 mmol/L (ref 135–145)

## 2016-07-25 LAB — CBC
HCT: 32 % — ABNORMAL LOW (ref 39.0–52.0)
HEMOGLOBIN: 10.7 g/dL — AB (ref 13.0–17.0)
MCH: 31.8 pg (ref 26.0–34.0)
MCHC: 33.4 g/dL (ref 30.0–36.0)
MCV: 95.2 fL (ref 78.0–100.0)
Platelets: 121 10*3/uL — ABNORMAL LOW (ref 150–400)
RBC: 3.36 MIL/uL — AB (ref 4.22–5.81)
RDW: 13.6 % (ref 11.5–15.5)
WBC: 8.8 10*3/uL (ref 4.0–10.5)

## 2016-07-25 LAB — HEPARIN LEVEL (UNFRACTIONATED)
HEPARIN UNFRACTIONATED: 0.18 [IU]/mL — AB (ref 0.30–0.70)
HEPARIN UNFRACTIONATED: 0.4 [IU]/mL (ref 0.30–0.70)

## 2016-07-25 LAB — GLUCOSE, CAPILLARY: Glucose-Capillary: 90 mg/dL (ref 65–99)

## 2016-07-25 MED ORDER — MIDAZOLAM HCL 2 MG/2ML IJ SOLN
2.0000 mg | Freq: Once | INTRAMUSCULAR | Status: AC
  Administered 2016-07-25: 2 mg via INTRAVENOUS

## 2016-07-25 MED ORDER — HEPARIN (PORCINE) IN NACL 100-0.45 UNIT/ML-% IJ SOLN
1100.0000 [IU]/h | INTRAMUSCULAR | Status: DC
  Administered 2016-07-25 – 2016-07-27 (×2): 1000 [IU]/h via INTRAVENOUS
  Administered 2016-07-28 – 2016-07-30 (×3): 1100 [IU]/h via INTRAVENOUS
  Filled 2016-07-25 (×5): qty 250

## 2016-07-25 MED ORDER — LIDOCAINE HCL (PF) 1 % IJ SOLN
30.0000 mL | Freq: Once | INTRAMUSCULAR | Status: AC
  Administered 2016-07-25: 30 mL via INTRADERMAL
  Filled 2016-07-25 (×2): qty 30

## 2016-07-25 MED ORDER — MORPHINE SULFATE (PF) 2 MG/ML IV SOLN
2.0000 mg | INTRAVENOUS | Status: DC | PRN
  Administered 2016-07-25 – 2016-07-28 (×9): 2 mg via INTRAVENOUS
  Filled 2016-07-25 (×9): qty 1

## 2016-07-25 MED ORDER — MIDAZOLAM HCL 2 MG/2ML IJ SOLN
INTRAMUSCULAR | Status: AC
Start: 2016-07-25 — End: 2016-07-25
  Administered 2016-07-25: 2 mg via INTRAVENOUS
  Filled 2016-07-25: qty 2

## 2016-07-25 NOTE — Progress Notes (Addendum)
      [address]       [place] [address]             [phone]         Subjective: Feels okay. He states that he only has a small amount of pain with a deep breath. Otherwise no shortness of breath or pain.   Objective: Vital signs in last 24 hours: Temp:  [97.5 F (36.4 C)-98.5 F (36.9 C)] 97.5 F (36.4 C) (06/05 0805) Pulse Rate:  [66-84] 70 (06/05 0805) Cardiac Rhythm: Normal sinus rhythm (06/05 0316) Resp:  [15-22] 20 (06/05 0805) BP: (126-156)/(86-92) 146/92 (06/05 0805) SpO2:  [96 %-100 %] 96 % (06/05 0805) Weight:  [76.2 kg (167 lb 15.9 oz)] 76.2 kg (167 lb 15.9 oz) (06/05 0500)     Intake/Output from previous day: 06/04 0701 - 06/05 0700 In: 2769.8 [P.O.:1086; I.V.:1683.8] Out: 2075 [Urine:2075] Intake/Output this shift: No intake/output data recorded.  General appearance: alert, cooperative and no distress Heart: regular rate and rhythm, S1, S2 normal, no murmur, click, rub or gallop Lungs: diminished breath sounds in the right lower lobe Abdomen: soft, non-tender; bowel sounds normal; no masses,  no organomegaly Extremities: extremities normal, atraumatic, no cyanosis or edema Wound: hematoma right chest wall  Lab Results:  Recent Labs  07/24/16 1456 07/25/16 0258  WBC 9.4 8.8  HGB 10.7* 10.7*  HCT 32.0* 32.0*  PLT 108* 121*   BMET:  Recent Labs  07/24/16 0326 07/25/16 0258  NA 140 137  K 4.0 4.1  CL 111 107  CO2 20* 23  GLUCOSE 136* 98  BUN 17 10  CREATININE 1.29* 1.20  CALCIUM 8.4* 8.2*    PT/INR:  Recent Labs  07/23/16 2156  LABPROT 15.8*  INR 1.25   ABG No results found for: PHART, HCO3, TCO2, ACIDBASEDEF, O2SAT CBG (last 3)   Recent Labs  07/24/16 0739 07/25/16 0708  GLUCAP 115* 90    Assessment/Plan:  1. Recurrent PE and new spontaneous right pneumothorax. Chest catheter fell out in transit and now there is a chest wall hematoma. CXR this morning shows: Interim progression of right-sided  pneumothorax with basilar component noted. Atelectasis right lower lobe. Currently on 3L  with good oxygen saturation. 2. Chest wall hematoma-heparin held for 12 hours and now restarted.   Plan: Stable. CXR revealed right-sided pneumo with basilar component. Likely will need CT placement.    LOS: 2 days    Elgie Collard 07/25/2016 Patient seen and examined, agree with above CXR shows increased right pneumothorax- will place new CT Indications, risks, benefits and alternatives d/w patient.  Revonda Standard Roxan Hockey, MD [facility] [phone]

## 2016-07-25 NOTE — Procedures (Signed)
After informed consent obtained, premedicated with 2 mg IV versed  Sterile technique 20 ml 1% lidocaine local 28 F CT placed right anterior chest, + rush of air Tolerated well CXR pending  Remo Lipps C. Roxan Hockey, MD [facility] [phone]

## 2016-07-25 NOTE — Progress Notes (Signed)
[facility] PROGRESS NOTE  Charles [name] VOJ:[id number] DOB: 09/03/1950 DOA: 07/23/2016  PCP: Biagio Borg, MD  Brief History/Interval Summary: 66 year old African-American male with a past medical history of hypertension, hyperlipidemia, COPD, stroke, history of PE in 2016, but no longer on anticoagulation, presented with shortness of breath. He was evaluated at an outside facility. Evaluation revealed bilateral pulmonary embolism but also showed right-sided pneumothorax. A chest tube was placed, he was started on heparin and the patient was transferred to this hospital. Overnight, the chest tube site started bleeding. Heparin was placed on hold.  Reason for Visit: Bilateral pulmonary embolism. Right-sided pneumothorax  Consultants: CTS: Dr. Roxan Hockey.  Procedures:  Chest tube placement at the ED  Transthoracic echocardiogram Study Conclusions  - Left ventricle: The cavity size was normal. Systolic function was   normal. The estimated ejection fraction was in the range of 60%   to 65%. Wall motion was normal; there were no regional wall   motion abnormalities. Doppler parameters are consistent with   abnormal left ventricular relaxation (grade 1 diastolic   dysfunction). There was no evidence of elevated ventricular   filling pressure by Doppler parameters. - Aortic valve: Trileaflet; mildly thickened, mildly calcified   leaflets. Transvalvular velocity was within the normal range.   There was no stenosis. There was no regurgitation. - Aortic root: The aortic root was normal in size. - Mitral valve: There was trivial regurgitation. - Right ventricle: The cavity size was normal. Wall thickness was   normal. Systolic function was normal. - Tricuspid valve: There was trivial regurgitation. - Pulmonary arteries: Systolic pressure was within the normal   range. - Inferior vena cava: The vessel was normal in size. - Pericardium, extracardiac: There was no pericardial  effusion. Impressions: - Normal RV size and function, trivial TR, no RVSP.  Lower extremity venous Doppler Summary: - Findings consistent with acute deep vein thrombosis involving the   left common femoral vein, left profunda femoris vein, left   femoral vein, and left popliteal vein. - No evidence of deep vein thrombosis involving the right lower   extremity. - Technically limited visualization of the iliac veins and could   not visualize IVC due to overlying bowel gas.   Appears to be patent right external iliac vein.   Apperas to be mobile DVT of the distal left external iliac vein.   Antibiotics: None  Subjective/Interval History: Patient feels well. Denies any chest pain. Denies any nausea, vomiting. No shortness of breath today. Hasn't noticed any bleeding from his right chest.  ROS: No headache  Objective:  Vital Signs  Vitals:   07/24/16 1923 07/24/16 2235 07/25/16 0316 07/25/16 0500  BP: (!) 145/92 126/86 (!) 156/91   Pulse: 67 78 84   Resp: (!) 22 (!) 22 (!) 22   Temp: 98.5 F (36.9 C) 98.2 F (36.8 C) 98 F (36.7 C)   TempSrc: Oral Oral Oral   SpO2: 99% 99%    Weight:    76.2 kg (167 lb 15.9 oz)  Height:        Intake/Output Summary (Last 24 hours) at 07/25/16 0725 Last data filed at 07/25/16 9937  Gross per 24 hour  Intake          2769.83 ml  Output             2075 ml  Net           694.83 ml   Filed Weights   07/23/16 2115 07/24/16 0401 07/25/16  0500  Weight: 77 kg (169 lb 12.1 oz) 77.1 kg (169 lb 15.6 oz) 76.2 kg (167 lb 15.9 oz)    General appearance: Awake, alert. No distress Resp: Noted to have poor air entry on the right. No definite crackles. Dressing removed from the right chest and does not show any bleeding. Hematoma is appreciated in that area. Bruising is noted. Nontender. Cardio: S1, S2 is normal, regular. No S3, S4. No rubs or murmurs, or bruit GI: Abdomen is soft. Nontender, nondistended. Bowel sounds present. No masses or  organomegaly Extremities: No edema Neurologic: No focal deficits  Lab Results:  Data Reviewed: I have personally reviewed following labs and imaging studies  CBC:  Recent Labs Lab 07/24/16 0326 07/24/16 1456 07/25/16 0258  WBC 9.7 9.4 8.8  HGB 11.7* 10.7* 10.7*  HCT 33.8* 32.0* 32.0*  MCV 94.7 94.4 95.2  PLT 104* 108* 121*    Basic Metabolic Panel:  Recent Labs Lab 07/24/16 0326 07/25/16 0258  NA 140 137  K 4.0 4.1  CL 111 107  CO2 20* 23  GLUCOSE 136* 98  BUN 17 10  CREATININE 1.29* 1.20  CALCIUM 8.4* 8.2*    GFR: Estimated Creatinine Clearance: 67.9 mL/min (by C-G formula based on SCr of 1.2 mg/dL).  Coagulation Profile:  Recent Labs Lab 07/23/16 2156  INR 1.25    Cardiac Enzymes:  Recent Labs Lab 07/23/16 2156 07/24/16 0326 07/24/16 0852  TROPONINI 0.04* 0.04* 0.04*    CBG:  Recent Labs Lab 07/24/16 0739 07/25/16 0708  GLUCAP 115* 90     Recent Results (from the past 240 hour(s))  MRSA PCR Screening     Status: None   Collection Time: 07/23/16  9:31 PM  Result Value Ref Range Status   MRSA by PCR NEGATIVE NEGATIVE Final    Comment:        The GeneXpert MRSA Assay (FDA approved for NASAL specimens only), is one component of a comprehensive MRSA colonization surveillance program. It is not intended to diagnose MRSA infection nor to guide or monitor treatment for MRSA infections.       Radiology Studies: Dg Chest Port 1 View  Result Date: 07/24/2016 CLINICAL DATA:  Chest tube removal. EXAM: PORTABLE CHEST 1 VIEW COMPARISON:  07/24/2016 FINDINGS: Normal heart size. Aortic atherosclerosis. The chest tube has been removed. The small right apical pneumothorax is unchanged. There is also a small right lateral pneumothorax noted which appears similar to the previous exam with the extra pleural density overlying the lateral right lower lobe. IMPRESSION: 1. No significant change in the appearance of small right apical pneumothorax.  Electronically Signed   By: Kerby Moors M.D.   On: 07/24/2016 14:56   Dg Chest Port 1 View  Result Date: 07/24/2016 CLINICAL DATA:  Bleeding around RIGHT side chest tube EXAM: PORTABLE CHEST 1 VIEW COMPARISON:  Portable exam 0833 hours compared to 07/23/2016 FINDINGS: Small bore RIGHT basilar thoracostomy tube again seen. Stable heart size, mediastinal contours and pulmonary vascularity. Atherosclerotic calcification aorta. Minimal atelectasis or developing infiltrate at RIGHT base. Subtle extrapleural density versus loculated effusion at lateral RIGHT base, potentially related to chest tube insertion. Upper lungs clear. Small RIGHT apical pneumothorax slightly increased. Question few blebs at RIGHT apex. No pleural effusion. Bones demineralized with scattered degenerative disc disease changes thoracic spine. IMPRESSION: Small RIGHT apex pneumothorax, slightly increased. Developing atelectasis versus infiltrate at RIGHT base. Aortic atherosclerosis. Findings called to Norva Karvonen RN on [facility] on 07/24/2016 at 0844 hrs. Electronically Signed  By: Lavonia Dana M.D.   On: 07/24/2016 08:44   Dg Chest Port 1 View  Result Date: 07/23/2016 CLINICAL DATA:  Chest tube placement.  Pneumothorax. EXAM: PORTABLE CHEST 1 VIEW COMPARISON:  Chest CT 01/28/2016 FINDINGS: The heart size and mediastinal contours are within normal limits. There is a right-sided chest tube with tip projecting over the medial aspect of the right lung base and mid T12 vertebra. No pneumothorax or midline shift. Postprocedural induration of the adjacent right lung base laterally. Emphysematous hyperinflation of the lungs bilaterally. No pulmonary consolidation or effusion. The left costophrenic angle is excluded The visualized skeletal structures are unremarkable. IMPRESSION: Post procedural change involving the right lower lobe laterally with right-sided chest tube seen with tip projecting over the medial right lung base and lower thoracic  spine. No radiographically apparent pneumothorax or midline shift. Electronically Signed   By: Ashley Royalty M.D.   On: 07/23/2016 22:07     Medications:  Scheduled: . amLODipine  10 mg Oral Daily  . ezetimibe  10 mg Oral Daily  . mouth rinse  15 mL Mouth Rinse BID  . rosuvastatin  40 mg Oral q1800  . sodium chloride flush  3 mL Intravenous Q12H   Continuous: . sodium chloride 75 mL/hr at 07/24/16 1603  . heparin 1,000 Units/hr (07/25/16 0055)   NWG:[id number] **OR** acetaminophen, albuterol, dextromethorphan-guaiFENesin, hydrALAZINE, ipratropium, ondansetron **OR** ondansetron (ZOFRAN) IV, oxyCODONE-acetaminophen, zolpidem  Assessment/Plan:  Principal Problem:   Acute pulmonary embolism (HCC) Active Problems:   Hyperlipidemia   Essential hypertension   COPD (chronic obstructive pulmonary disease) (HCC)   Coronary artery calcification seen on CT scan   Pneumothorax on right   PE (pulmonary thromboembolism) (HCC)   Acute renal failure superimposed on stage 3 chronic kidney disease ([place])    Acute pulmonary embolism Along with Left lower extremity DVT PE was detected on CT scan done at the outside facility. This is patient's second episode of PE. Initial episode was diagnosed in December 2015. According to the patient that episode occurred in the setting of multiple pneumonias as a result of which he was quite sedentary. He was taken off of anticoagulation after 1 year. Denies any road trips or travel recently, though he did travel to [place] 2 months ago. Reason for this current episode of PE is not entirely clear. He will need further workup as an outpatient. Patient was started on IV heparin but then he started bleeding into the chest tube site. Heparin was briefly placed on hold based on cardiothoracic surgery recommendation. Reinitiated at midnight. Seems to be stable. Hemoglobin is holding stable. Patient also detected to have lower extremity DVT. Considering his other acute  comorbidities, patient is not a candidate for any interventional treatment. Echocardiogram report reviewed. No evidence for right heart strain. Once the pneumothorax has resolved and chest tube has been pulled out, he could be transitioned to oral anticoagulation.  Right-sided pneumothorax with bleeding complication Patient does have a history of COPD. Noted to have emphysema on previous CT scans. Could have had blebs, which could've resulted in pneumothorax. Patient was seen by cardiothoracic surgery. Appreciate their assistance. X-ray this morning showed increase in pneumothorax. His original chest tube fell out yesterday. Cardiothoracic to determine further course of action today. Bleeding appears to have subsided. Hemoglobin is stable. Heparin has been reinitiated as discussed above.  Anemia secondary to acute blood loss Hemoglobin is stable. Continue to monitor closely. Transfuse as needed.  Acute on chronic kidney disease stage III. Baseline creatinine about  1.1-1.2. It was 1.6 at admission. Improved with gentle IV hydration. Cut back on IV fluids.   History of COPD Appears to be stable. Continue nebulizer treatments. Atelectasis noted on chest x-ray from this morning.   History of essential hypertension. Continue to monitor blood pressures. Continue amlodipine. Hydralazine as needed. Lisinopril was held due to elevated creatinine. Continue to hold for at least 1 or 2 more days.   DVT Prophylaxis: IV heparin  Code Status: Full code  Family Communication: Discussed with the patient  Disposition Plan: Management as outlined above.    LOS: 2 days   [place] [facility] Pager [phone] 07/25/2016, 7:25 AM  If 7PM-7AM, please contact night-coverage at [email or web address], password [facility]

## 2016-07-25 NOTE — Progress Notes (Signed)
RN received phone call stating that patient had increasing right pneumothorax after chest tube displacement yesterday. RN paged cardiothoracic PA, Tacy Dura. RN then paged [facility] MD, [place] Pink.

## 2016-07-25 NOTE — Progress Notes (Signed)
ANTICOAGULATION CONSULT NOTE - Initial Consult  Pharmacy Consult for Heparin Indication: pulmonary embolus  No Known Allergies  Patient Measurements: Height: 6\' 4"  (193 cm) Weight: 167 lb 15.9 oz (76.2 kg) IBW/kg (Calculated) : 86.8  Assessment: 66 year old male started on heparin gtt for possible PE. Patient was bleeding from chest tube overnight so heparin stopped. MD stated risk of bleed is more than risk of possible PE. Heparin restarted on 6/5 at midnight but then paused at 0800 for chest tube placement and heparin level drawn 20 minutes after. Heparin level came back low at 0.17. Hgb 10.7, plts 121.   Repeat heparin level is now therapeutic at 0.4 on heparin 1000 units/hr. No issues with infusion or bleeding noted.  Goal of Therapy:  Heparin level 0.3-0.5 units/ml Monitor platelets by anticoagulation protocol: Yes   Plan:  Continue heparin 1000 units/hr 6h confirmatory HL Monitor daily heparin level, CBC, s/s of bleed  Andrey Cota. Diona Foley, PharmD, BCPS Clinical Pharmacist [phone] 07/25/2016 7:28 PM

## 2016-07-25 NOTE — Progress Notes (Signed)
ANTICOAGULATION CONSULT NOTE - Initial Consult  Pharmacy Consult for Heparin Indication: pulmonary embolus  No Known Allergies  Patient Measurements: Height: 6\' 4"  (193 cm) Weight: 167 lb 15.9 oz (76.2 kg) IBW/kg (Calculated) : 86.8  Assessment: 65 year old male started on heparin gtt for possible PE. Patient was bleeding from chest tube overnight so heparin stopped. MD stated risk of bleed is more than risk of possible PE. Heparin restarted on 6/5 at midnight but then paused at 0800 for chest tube placement and heparin level drawn 20 minutes after. Heparin level came back low at 0.17. Hgb 10.7, plts 121.   Goal of Therapy:  Heparin level 0.3-0.5 units/ml Monitor platelets by anticoagulation protocol: Yes   Plan:  Restart heparin gtt at 1,000 units/hr at 1100 Check 6 hr heparin level Monitor daily heparin level, CBC, s/s of bleed  Elenor Quinones, PharmD, [facility] Clinical Pharmacist Pager [phone] 07/25/2016 9:58 AM

## 2016-07-26 ENCOUNTER — Inpatient Hospital Stay (HOSPITAL_COMMUNITY): Payer: Managed Care, Other (non HMO)

## 2016-07-26 DIAGNOSIS — R609 Edema, unspecified: Secondary | ICD-10-CM

## 2016-07-26 DIAGNOSIS — J9383 Other pneumothorax: Secondary | ICD-10-CM

## 2016-07-26 LAB — CBC
HCT: 29.8 % — ABNORMAL LOW (ref 39.0–52.0)
HEMOGLOBIN: 10.2 g/dL — AB (ref 13.0–17.0)
MCH: 32.5 pg (ref 26.0–34.0)
MCHC: 34.2 g/dL (ref 30.0–36.0)
MCV: 94.9 fL (ref 78.0–100.0)
PLATELETS: 125 10*3/uL — AB (ref 150–400)
RBC: 3.14 MIL/uL — AB (ref 4.22–5.81)
RDW: 13.3 % (ref 11.5–15.5)
WBC: 8.4 10*3/uL (ref 4.0–10.5)

## 2016-07-26 LAB — HEPARIN LEVEL (UNFRACTIONATED): HEPARIN UNFRACTIONATED: 0.44 [IU]/mL (ref 0.30–0.70)

## 2016-07-26 LAB — GLUCOSE, CAPILLARY: GLUCOSE-CAPILLARY: 89 mg/dL (ref 65–99)

## 2016-07-26 MED ORDER — HYDROCOD POLST-CPM POLST ER 10-8 MG/5ML PO SUER
5.0000 mL | Freq: Two times a day (BID) | ORAL | Status: DC
  Administered 2016-07-26 – 2016-08-08 (×26): 5 mL via ORAL
  Filled 2016-07-26 (×26): qty 5

## 2016-07-26 MED ORDER — ENSURE ENLIVE PO LIQD
237.0000 mL | Freq: Two times a day (BID) | ORAL | Status: DC
  Administered 2016-07-26 – 2016-07-30 (×8): 237 mL via ORAL

## 2016-07-26 NOTE — Progress Notes (Signed)
[facility] PROGRESS NOTE  Charles [name] UXL:[id number] DOB: 1950/09/10 DOA: 07/23/2016  PCP: Biagio Borg, MD   Subjective/Interval History: Complains about pleuritic chest pain with cough and taking deep breath. Requested something for cough, has opioids for pain control.  Brief History/Interval Summary: 66 year old African-American male with a past medical history of hypertension, hyperlipidemia, COPD, stroke, history of PE in 2016, but no longer on anticoagulation, presented with shortness of breath. He was evaluated at an outside facility. Evaluation revealed bilateral pulmonary embolism but also showed right-sided pneumothorax. A chest tube was placed, he was started on heparin and the patient was transferred to this hospital. Overnight, the chest tube site started bleeding. Heparin was placed on hold.  Reason for Visit: Bilateral pulmonary embolism. Right-sided pneumothorax  Consultants: CTS: Dr. Roxan Hockey.  Procedures:  Chest tube placement at the ED  Transthoracic echocardiogram Study Conclusions  - Left ventricle: The cavity size was normal. Systolic function was   normal. The estimated ejection fraction was in the range of 60%   to 65%. Wall motion was normal; there were no regional wall   motion abnormalities. Doppler parameters are consistent with   abnormal left ventricular relaxation (grade 1 diastolic   dysfunction). There was no evidence of elevated ventricular   filling pressure by Doppler parameters. - Aortic valve: Trileaflet; mildly thickened, mildly calcified   leaflets. Transvalvular velocity was within the normal range.   There was no stenosis. There was no regurgitation. - Aortic root: The aortic root was normal in size. - Mitral valve: There was trivial regurgitation. - Right ventricle: The cavity size was normal. Wall thickness was   normal. Systolic function was normal. - Tricuspid valve: There was trivial regurgitation. - Pulmonary  arteries: Systolic pressure was within the normal   range. - Inferior vena cava: The vessel was normal in size. - Pericardium, extracardiac: There was no pericardial effusion. Impressions: - Normal RV size and function, trivial TR, no RVSP.  Lower extremity venous Doppler Summary: - Findings consistent with acute deep vein thrombosis involving the   left common femoral vein, left profunda femoris vein, left   femoral vein, and left popliteal vein. - No evidence of deep vein thrombosis involving the right lower   extremity. - Technically limited visualization of the iliac veins and could   not visualize IVC due to overlying bowel gas.   Appears to be patent right external iliac vein.   Apperas to be mobile DVT of the distal left external iliac vein.   Antibiotics: None  ROS: No headache  Objective:  Vital Signs  Vitals:   07/25/16 2255 07/26/16 0407 07/26/16 0414 07/26/16 0741  BP: (!) 146/91 (!) 152/94  (!) 145/87  Pulse: 89 86  63  Resp: (!) 29 17  18   Temp: 98.7 F (37.1 C) 98.4 F (36.9 C)  98.5 F (36.9 C)  TempSrc: Oral Oral  Oral  SpO2: 99% 100%  99%  Weight:   77.8 kg (171 lb 8.3 oz)   Height:        Intake/Output Summary (Last 24 hours) at 07/26/16 1024 Last data filed at 07/26/16 0900  Gross per 24 hour  Intake             1490 ml  Output             1270 ml  Net              220 ml   Filed Weights   07/24/16 0401 07/25/16  0500 07/26/16 0414  Weight: 77.1 kg (169 lb 15.6 oz) 76.2 kg (167 lb 15.9 oz) 77.8 kg (171 lb 8.3 oz)    General appearance: Awake, alert. No distress Resp: Noted to have poor air entry on the right. No definite crackles. Dressing removed from the right chest and does not show any bleeding. Hematoma is appreciated in that area. Bruising is noted. Nontender. Cardio: S1, S2 is normal, regular. No S3, S4. No rubs or murmurs, or bruit GI: Abdomen is soft. Nontender, nondistended. Bowel sounds present. No masses or  organomegaly Extremities: No edema Neurologic: No focal deficits  Lab Results:  Data Reviewed: I have personally reviewed following labs and imaging studies  CBC:  Recent Labs Lab 07/24/16 0326 07/24/16 1456 07/25/16 0258 07/26/16 0100  WBC 9.7 9.4 8.8 8.4  HGB 11.7* 10.7* 10.7* 10.2*  HCT 33.8* 32.0* 32.0* 29.8*  MCV 94.7 94.4 95.2 94.9  PLT 104* 108* 121* 125*    Basic Metabolic Panel:  Recent Labs Lab 07/24/16 0326 07/25/16 0258 07/25/16 1123  NA 140 137 137  K 4.0 4.1 3.8  CL 111 107 106  CO2 20* 23 25  GLUCOSE 136* 98 98  BUN 17 10 9   CREATININE 1.29* 1.20 1.22  CALCIUM 8.4* 8.2* 8.3*    GFR: Estimated Creatinine Clearance: 68.2 mL/min (by C-G formula based on SCr of 1.22 mg/dL).  Coagulation Profile:  Recent Labs Lab 07/23/16 2156  INR 1.25    Cardiac Enzymes:  Recent Labs Lab 07/23/16 2156 07/24/16 0326 07/24/16 0852  TROPONINI 0.04* 0.04* 0.04*    CBG:  Recent Labs Lab 07/24/16 0739 07/25/16 0708 07/26/16 0739  GLUCAP 115* 90 89     Recent Results (from the past 240 hour(s))  MRSA PCR Screening     Status: None   Collection Time: 07/23/16  9:31 PM  Result Value Ref Range Status   MRSA by PCR NEGATIVE NEGATIVE Final    Comment:        The GeneXpert MRSA Assay (FDA approved for NASAL specimens only), is one component of a comprehensive MRSA colonization surveillance program. It is not intended to diagnose MRSA infection nor to guide or monitor treatment for MRSA infections.       Radiology Studies: Dg Chest Port 1 View  Result Date: 07/26/2016 CLINICAL DATA:  Chest tube follow-up EXAM: PORTABLE CHEST 1 VIEW COMPARISON:  Yesterday FINDINGS: Stable positioning of right apical chest tube. No convincingly seen pneumothorax. Mild atelectasis at the right base. The left lung is clear. Normal heart size and mediastinal contours. IMPRESSION: Stable chest tube positioning.  No visible pneumothorax. Electronically Signed   By:  Monte Fantasia M.D.   On: 07/26/2016 10:09   Dg Chest Port 1 View  Result Date: 07/25/2016 CLINICAL DATA:  Right-sided pneumothorax with large caliber chest tube treatment. EXAM: PORTABLE CHEST 1 VIEW COMPARISON:  Portable chest x-ray of July 25, 2016 FINDINGS: There has been interval re-expansion of the right lung with the tip of the newly placed chest tube overlying the interspace between the posterior second and third ribs. No definite residual pneumothorax is observed. There is no mediastinal shift. There is minimal interstitial density at the right lung base. The left lung is clear. The heart and pulmonary vascularity are normal. There is calcification in the wall of the aortic arch. There is degenerative disc disease of the thoracic spine and degenerative change of the left shoulder. IMPRESSION: Interval re-expansion of the right lung since large caliber chest tube placement. Minimal  right basilar subsegmental atelectasis. Thoracic aortic atherosclerosis. Electronically Signed   By: David  Martinique M.D.   On: 07/25/2016 11:03   Dg Chest Port 1 View  Result Date: 07/25/2016 CLINICAL DATA:  Right chest tube removal. EXAM: PORTABLE CHEST 1 VIEW COMPARISON:  07/24/2016.  07/23/2016 . FINDINGS: Interim progression of right-sided pneumothorax with basilar component noted. Atelectasis right lower lung. Heart size normal. No prominent pleural effusion. IMPRESSION: Interim progression of right-sided pneumothorax with basilar component noted. Atelectasis right lower lobe. Critical Value/emergent results were called by telephone at the time of interpretation on 07/25/2016 at 7:31 am to nurse Hildred Alamin, who verbally acknowledged these results. Electronically Signed   By: Marcello [place]  Register   On: 07/25/2016 07:33   Dg Chest Port 1 View  Result Date: 07/24/2016 CLINICAL DATA:  Chest tube removal. EXAM: PORTABLE CHEST 1 VIEW COMPARISON:  07/24/2016 FINDINGS: Normal heart size. Aortic atherosclerosis. The chest tube has been  removed. The small right apical pneumothorax is unchanged. There is also a small right lateral pneumothorax noted which appears similar to the previous exam with the extra pleural density overlying the lateral right lower lobe. IMPRESSION: 1. No significant change in the appearance of small right apical pneumothorax. Electronically Signed   By: Kerby Moors M.D.   On: 07/24/2016 14:56     Medications:  Scheduled: . amLODipine  10 mg Oral Daily  . ezetimibe  10 mg Oral Daily  . feeding supplement (ENSURE ENLIVE)  237 mL Oral BID BM  . mouth rinse  15 mL Mouth Rinse BID  . rosuvastatin  40 mg Oral q1800  . sodium chloride flush  3 mL Intravenous Q12H   Continuous: . sodium chloride 30 mL/hr at 07/26/16 0606  . heparin 1,000 Units/hr (07/26/16 0606)   HYW:VPXTGGYIRSWNI **OR** acetaminophen, albuterol, dextromethorphan-guaiFENesin, hydrALAZINE, ipratropium, morphine injection, ondansetron **OR** ondansetron (ZOFRAN) IV, oxyCODONE-acetaminophen, zolpidem  Assessment/Plan:  Principal Problem:   Acute pulmonary embolism (HCC) Active Problems:   Hyperlipidemia   Essential hypertension   COPD (chronic obstructive pulmonary disease) (HCC)   Coronary artery calcification seen on CT scan   Pneumothorax on right   PE (pulmonary thromboembolism) ([place])   Acute renal failure superimposed on stage 3 chronic kidney disease ([place])   Acute DVT (deep venous thrombosis) ([place])    Acute pulmonary embolism Along with Left lower extremity DVT -PE was detected on CT scan done at the outside facility. This is patient's second episode of PE(Previous on 12/15).  -Patient was started on IV heparin but then he started bleeding into the chest tube site.  -Initial chest tube accidentally pulled out, re-placed by Dr. Roxan Hockey on 07/25/2016. -Echocardiogram report reviewed. No evidence for right heart strain.  -We'll transition to oral anticoagulation when chest tube removed.  Right-sided pneumothorax with  bleeding complication -Patient does have a history of COPD. Noted to have emphysema on previous CT scans. Could have had blebs, which could've resulted in pneumothorax. Patient was seen by cardiothoracic surgery. Appreciate their assistance. X-ray this morning showed increase in pneumothorax. His original chest tube fell out yesterday. Cardiothoracic to determine further course of action today. Bleeding appears to have subsided. Hemoglobin is stable. Heparin has been reinitiated as discussed above.  Anemia secondary to acute blood loss Hemoglobin is stable. Continue to monitor closely. Transfuse as needed.  Acute on chronic kidney disease stage III. Baseline creatinine about 1.1-1.2. It was 1.6 at admission. Improved with gentle IV hydration. Cut back on IV fluids.   History of COPD Appears to be stable.  Continue nebulizer treatments. Atelectasis noted on chest x-ray from this morning.   History of essential hypertension. Continue to monitor blood pressures. Continue amlodipine. Hydralazine as needed. Lisinopril was held due to elevated creatinine. Continue to hold for at least 1 or 2 more days.   DVT Prophylaxis: IV heparin  Code Status: Full code  Family Communication: Discussed with the patient  Disposition Plan: Management as outlined above.    LOS: 3 days   [facility] Pager [phone] 07/26/2016, 10:24 AM  If 7PM-7AM, please contact night-coverage at [email or web address], password [facility]

## 2016-07-26 NOTE — Progress Notes (Signed)
Changed dressing of previous chest tube site for the 4th time this shift. Dressing was completely saturated.

## 2016-07-26 NOTE — Progress Notes (Signed)
      [address]       [place] [address]             [phone]         Subjective: No issues. His pain is overall well controlled on his current regimen. Not eating as much as he should.   Objective: Vital signs in last 24 hours: Temp:  [97.7 F (36.5 C)-99.1 F (37.3 C)] 98.5 F (36.9 C) (06/06 0741) Pulse Rate:  [63-89] 63 (06/06 0741) Cardiac Rhythm: Normal sinus rhythm (06/05 1930) Resp:  [17-29] 18 (06/06 0741) BP: (142-178)/(74-94) 145/87 (06/06 0741) SpO2:  [99 %-100 %] 99 % (06/06 0741) Weight:  [77.8 kg (171 lb 8.3 oz)] 77.8 kg (171 lb 8.3 oz) (06/06 0414)     Intake/Output from previous day: 06/05 0701 - 06/06 0700 In: 9528 [P.O.:360; I.V.:1132] Out: 1720 [Urine:1650; Chest Tube:70] Intake/Output this shift: No intake/output data recorded.  General appearance: alert, cooperative and no distress Heart: regular rate and rhythm, S1, S2 normal, no murmur, click, rub or gallop Lungs: diminshed sounds in the right chest, CTA on the left in all fields Abdomen: soft, non-tender; bowel sounds normal; no masses,  no organomegaly Extremities: extremities normal, atraumatic, no cyanosis or edema Wound: chest tube with good placement.   Lab Results:  Recent Labs  07/25/16 0258 07/26/16 0100  WBC 8.8 8.4  HGB 10.7* 10.2*  HCT 32.0* 29.8*  PLT 121* 125*   BMET:  Recent Labs  07/25/16 0258 07/25/16 1123  NA 137 137  K 4.1 3.8  CL 107 106  CO2 23 25  GLUCOSE 98 98  BUN 10 9  CREATININE 1.20 1.22  CALCIUM 8.2* 8.3*    PT/INR:  Recent Labs  07/23/16 2156  LABPROT 15.8*  INR 1.25   ABG No results found for: PHART, HCO3, TCO2, ACIDBASEDEF, O2SAT CBG (last 3)   Recent Labs  07/24/16 0739 07/25/16 0708 07/26/16 0739  GLUCAP 115* 90 89    Assessment/Plan: 1. Recurrent PE, on heparin 2. Chest tube insertion for enlarging right pneumothorax. CXR post chest tube placement showed re-expansion of the right lung. Minimal right  basilar subsegmental atelectasis. + air leak with inspiration and cough 3. Pain control: currently on Percocet and morphine for break through pain. 4. Improve oral intake, will add Ensure. Not a diabetic.   Plan: CXR ordered for tomorrow. Continue chest tube.    LOS: 3 days    Charles [name] 07/26/2016

## 2016-07-26 NOTE — Progress Notes (Addendum)
      [address]       [place] [address]             [phone]      Went to evaluate the patient due to a page I received while in the OR regarding increased bloody drainage from the original pigtail site. There was a slow ooz from the pigtail site with clot present. No issues with the chest tube site Dr. Roxan Hockey placed yesterday. CXR from today reviewed with Dr. Roxan Hockey with no pneumothorax. Await official ready from radiology.  The patient was also concern about some blood tinged sputum. I assured him this was normal for his medical condition but if there are any changes or the amount increased to notify his nurse. He had no other questions/concerns at this time. We will continue to monitor.    Nicholes Rough, PA-C   Patient seen and examined, agree with above Some pain from tube There is some blood on the dressing at the old site. Some of that may be blood leaching out form the old hematoma. No significant active bleeding at this time- change dressing PRN, apply pressure as needed He still has an air leak. Will keep CT on suction today CXR shows lung is up  [place]. Roxan Hockey, MD [facility] [phone]

## 2016-07-26 NOTE — Progress Notes (Signed)
ANTICOAGULATION CONSULT NOTE - Follow Up Consult  Pharmacy Consult for Heparin  Indication: pulmonary embolus  No Known Allergies  Patient Measurements: Height: 6\' 4"  (193 cm) Weight: 171 lb 8.3 oz (77.8 kg) IBW/kg (Calculated) : 86.8  Assessment: 66 y/o M on started on heparin gtt for possible PE. Patient was bleeding from chest tube overnight so heparin stopped. MD stated risk of bleed is more than risk of possible PE. Heparin restarted on 6/5 at midnight but then paused at 0800 for chest tube placement and heparin level drawn 20 minutes after. Heparin level remains therapeutic. Hgb 10.2, plts 125.   Goal of Therapy:  Heparin level 0.3-0.5 units/ml Monitor platelets by anticoagulation protocol: Yes   Plan:  Continue heparin gtt at 1,000 units/hr Monitor daily heparin level, CBC, s/s of bleed  Elenor Quinones, PharmD, [facility] Clinical Pharmacist Pager [phone] 07/26/2016 8:50 AM

## 2016-07-26 NOTE — Progress Notes (Signed)
Patient has needed another (3rd) dressing change at the previous chest tube site and pressure applied. Patient bled through the dressing, gown, and onto bedding. The current chest tube site hasn't had any bleeding, continues to have same shadowing as morning assessment. Will continue to monitor.

## 2016-07-26 NOTE — Progress Notes (Signed)
Patient's previous chest tube site is becoming more ecchymotic and has been bleeding. I have changed the saturated dressing twice this morning and now patient is reporting that he is coughing up blood. Notified Elma[place], MD and was instructed to notify [facility].

## 2016-07-26 NOTE — Progress Notes (Signed)
Charted in error.

## 2016-07-26 NOTE — Progress Notes (Signed)
ANTICOAGULATION CONSULT NOTE - Follow Up Consult  Pharmacy Consult for Heparin  Indication: pulmonary embolus  No Known Allergies  Patient Measurements: Height: 6\' 4"  (193 cm) Weight: 167 lb 15.9 oz (76.2 kg) IBW/kg (Calculated) : 86.8  Vital Signs: Temp: 98.7 F (37.1 C) (06/05 2255) Temp Source: Oral (06/05 2255) BP: 146/91 (06/05 2255) Pulse Rate: 89 (06/05 2255)  Labs:  Recent Labs  07/23/16 2156  07/24/16 0326 07/24/16 0852 07/24/16 1456 07/25/16 0258 07/25/16 0820 07/25/16 1123 07/25/16 1806 07/26/16 0100  HGB  --   < > 11.7*  --  10.7* 10.7*  --   --   --  10.2*  HCT  --   < > 33.8*  --  32.0* 32.0*  --   --   --  29.8*  PLT  --   < > 104*  --  108* 121*  --   --   --  125*  APTT >200*  --   --   --   --   --   --   --   --   --   LABPROT 15.8*  --   --   --   --   --   --   --   --   --   INR 1.25  --   --   --   --   --   --   --   --   --   HEPARINUNFRC  --   --   --   --   --   --  0.18*  --  0.40 0.44  CREATININE  --   --  1.29*  --   --  1.20  --  1.22  --   --   TROPONINI 0.04*  --  0.04* 0.04*  --   --   --   --   --   --   < > = values in this interval not displayed.  Estimated Creatinine Clearance: 66.8 mL/min (by C-G formula based on SCr of 1.22 mg/dL).  Assessment: 66 y/o M on heparin drip for possible PE, was previously stopped for some chest tube bleeding but is now re-started and heparin level is therapeutic x 2, Hgb 10.2, Plts with slight trend up  Goal of Therapy:  Heparin level 0.3-0.5 units/ml Monitor platelets by anticoagulation protocol: Yes   Plan:  Cont heparin drip at 1000 units/hr Daily CBC/HL Monitor for bleeding   Narda Bonds 07/26/2016,2:11 AM

## 2016-07-26 NOTE — Progress Notes (Signed)
Dr. Roxan Hockey instructed to hold pressure to the site for 20 minutes and not to stop the heparin. Pressure was held to the site for 20 minutes. Will continue to monitor.

## 2016-07-27 ENCOUNTER — Inpatient Hospital Stay (HOSPITAL_COMMUNITY): Payer: Managed Care, Other (non HMO)

## 2016-07-27 LAB — BASIC METABOLIC PANEL
Anion gap: 6 (ref 5–15)
BUN: 10 mg/dL (ref 6–20)
CHLORIDE: 103 mmol/L (ref 101–111)
CO2: 26 mmol/L (ref 22–32)
CREATININE: 1.26 mg/dL — AB (ref 0.61–1.24)
Calcium: 8.3 mg/dL — ABNORMAL LOW (ref 8.9–10.3)
GFR calc Af Amer: >60 mL/min (ref >60)
GFR calc non Af Amer: 59 mL/min — ABNORMAL LOW (ref >60)
Glucose, Bld: 138 mg/dL — ABNORMAL HIGH (ref 65–99)
Potassium: 3.5 mmol/L (ref 3.5–5.1)
SODIUM: 135 mmol/L (ref 135–145)

## 2016-07-27 LAB — CBC
HCT: 25.3 % — ABNORMAL LOW (ref 39.0–52.0)
HCT: 26.5 % — ABNORMAL LOW (ref 39.0–52.0)
HEMATOCRIT: 24.7 % — AB (ref 39.0–52.0)
HEMATOCRIT: 24.1 % — AB (ref 39.0–52.0)
HEMOGLOBIN: 8.4 g/dL — AB (ref 13.0–17.0)
HEMOGLOBIN: 8.6 g/dL — AB (ref 13.0–17.0)
HEMOGLOBIN: 8.2 g/dL — AB (ref 13.0–17.0)
HEMOGLOBIN: 8 g/dL — AB (ref 13.0–17.0)
MCH: 31.8 pg (ref 26.0–34.0)
MCH: 31.3 pg (ref 26.0–34.0)
MCH: 32.3 pg (ref 26.0–34.0)
MCH: 32.3 pg (ref 26.0–34.0)
MCHC: 33.2 g/dL (ref 30.0–36.0)
MCHC: 32.5 g/dL (ref 30.0–36.0)
MCHC: 33.2 g/dL (ref 30.0–36.0)
MCHC: 33.2 g/dL (ref 30.0–36.0)
MCV: 95.8 fL (ref 78.0–100.0)
MCV: 96.4 fL (ref 78.0–100.0)
MCV: 97.2 fL (ref 78.0–100.0)
MCV: 97.2 fL (ref 78.0–100.0)
PLATELETS: 133 10*3/uL — AB (ref 150–400)
PLATELETS: 141 10*3/uL — AB (ref 150–400)
Platelets: 149 10*3/uL — ABNORMAL LOW (ref 150–400)
Platelets: 146 10*3/uL — ABNORMAL LOW (ref 150–400)
RBC: 2.64 MIL/uL — AB (ref 4.22–5.81)
RBC: 2.75 MIL/uL — AB (ref 4.22–5.81)
RBC: 2.54 MIL/uL — AB (ref 4.22–5.81)
RBC: 2.48 MIL/uL — ABNORMAL LOW (ref 4.22–5.81)
RDW: 13.3 % (ref 11.5–15.5)
RDW: 13.3 % (ref 11.5–15.5)
RDW: 13.4 % (ref 11.5–15.5)
RDW: 13.4 % (ref 11.5–15.5)
WBC: 7.9 10*3/uL (ref 4.0–10.5)
WBC: 8.3 10*3/uL (ref 4.0–10.5)
WBC: 10.6 10*3/uL — AB (ref 4.0–10.5)
WBC: 10.6 10*3/uL — ABNORMAL HIGH (ref 4.0–10.5)

## 2016-07-27 LAB — HEPARIN LEVEL (UNFRACTIONATED): HEPARIN UNFRACTIONATED: 0.37 [IU]/mL (ref 0.30–0.70)

## 2016-07-27 LAB — GLUCOSE, CAPILLARY: GLUCOSE-CAPILLARY: 111 mg/dL — AB (ref 65–99)

## 2016-07-27 MED ORDER — BENZONATATE 100 MG PO CAPS
100.0000 mg | ORAL_CAPSULE | Freq: Three times a day (TID) | ORAL | Status: DC
  Administered 2016-07-27 – 2016-08-08 (×34): 100 mg via ORAL
  Filled 2016-07-27 (×36): qty 1

## 2016-07-27 MED ORDER — GUAIFENESIN ER 600 MG PO TB12
600.0000 mg | ORAL_TABLET | Freq: Two times a day (BID) | ORAL | Status: DC
  Administered 2016-07-27 – 2016-07-30 (×8): 600 mg via ORAL
  Filled 2016-07-27 (×8): qty 1

## 2016-07-27 NOTE — Progress Notes (Signed)
Dressing to previous chest tube site changed. Dressing was saturated with large blood clot. Bed linens changed along with gown changed as it all was saturated in blood. Applied and vaseline gauze, 4x4s and hyperfix tape to sited. Changed current chest tube dressing as well. Patient denies pain at this time. Will continue to monitor.

## 2016-07-27 NOTE — Progress Notes (Addendum)
      [address]       [place] [address]             [phone]         Subjective: The patient states he is feeling better each day  Objective: Vital signs in last 24 hours: Temp:  [97.6 F (36.4 C)-99.3 F (37.4 C)] 99.3 F (37.4 C) (06/07 0736) Pulse Rate:  [79-104] 104 (06/07 0736) Cardiac Rhythm: Normal sinus rhythm (06/07 0700) Resp:  [16-23] 23 (06/07 0736) BP: (107-127)/(65-92) 107/65 (06/07 0736) SpO2:  [96 %-98 %] 96 % (06/07 0736) Weight:  [77.2 kg (170 lb 3.1 oz)] 77.2 kg (170 lb 3.1 oz) (06/07 0500)     Intake/Output from previous day: 06/06 0701 - 06/07 0700 In: 130 [P.O.:355; I.V.:120] Out: 935 [Urine:875; Chest Tube:60] Intake/Output this shift: Total I/O In: -  Out: 300 [Urine:300]  General appearance: alert, cooperative and no distress Heart: sinus tachycardia Lungs: clear to auscultation bilaterally Abdomen: soft, non-tender; bowel sounds normal; no masses,  no organomegaly Extremities: extremities normal, atraumatic, no cyanosis or edema Wound: chest tube with good placement. Minimum drainage on pigtail site dressing  Lab Results:  Recent Labs  07/26/16 0100 07/27/16 0248  WBC 8.4 7.9  HGB 10.2* 8.4*  HCT 29.8* 25.3*  PLT 125* 133*   BMET:  Recent Labs  07/25/16 0258 07/25/16 1123  NA 137 137  K 4.1 3.8  CL 107 106  CO2 23 25  GLUCOSE 98 98  BUN 10 9  CREATININE 1.20 1.22  CALCIUM 8.2* 8.3*    PT/INR: No results for input(s): LABPROT, INR in the last 72 hours. ABG No results found for: PHART, HCO3, TCO2, ACIDBASEDEF, O2SAT CBG (last 3)   Recent Labs  07/25/16 0708 07/26/16 0739 07/27/16 0738  GLUCAP 90 89 111*    Assessment/Plan:  1. Recurrent PE, on heparin 2. Chest tube insertion for enlarging right pneumothorax. This mornings xray showed a 5% right apical pneumothorax. Right chest tube in stable position. Otherwise stable. No air leak. Chest tube changed to water seal 3. Bleeding original  pigtail site-pressure applied yesterday. Hemoglobin dropped from 10.2 to 8.4. Dressing this morning with Vaseline gauze and minimal bloody drainage. Continue PRN dressing changes. Will monitor closely 4. Pain control: Currently on Percocet and morphine for break through pain.     LOS: 4 days    Elgie Collard 07/27/2016 Patient seen and examined, agree with above No air leak will place CT to water seal. Minimal drainage from prior tube site CXR OK  Enaya Howze C. Roxan Hockey, MD [facility] [phone]

## 2016-07-27 NOTE — Progress Notes (Signed)
[facility] Progress Note  Patient: Charles [name] OJJ:[id number]   PCP: Biagio Borg, MD DOB: September 18, 1950   DOA: 07/23/2016   DOS: 07/27/2016   Date of Service: the patient was seen and examined on 07/27/2016  Subjective: Patient continues to have some cough. Breathing is about the same as yesterday. Chest pain at the chest tube site still present. No nausea no vomiting no diarrhea. No abdominal pain no fever no chills. No headache. No focal deficit. No active bleeding reported elsewhere. Had significant amount of oozing from the chest tube insertion site yesterday.  Brief hospital course: 66 year old African-American male with a past medical history of hypertension, hyperlipidemia, COPD, stroke, history of PE in 2016, but no longer on anticoagulation, presented with shortness of breath. He was evaluated at an outside facility. Evaluation revealed bilateral pulmonary embolism but also showed right-sided pneumothorax. A chest tube was placed, he was started on heparin and the patient was transferred to this hospital. Cardiology surgery currently following the chest tube. Pneumothorax still present. Currently further plan is continue monitor.  Assessment and Plan: Acute pulmonary embolism Along with Left lower extremity DVT -PE was detected on CT scan done at the outside facility. This is patient's second episode of PE(Previous on 12/15).  -Patient was started on IV heparin but then he started bleeding into the chest tube site.  -Initial chest tube accidentally pulled out, re-placed by Dr. Roxan Hockey on 07/25/2016. -Echocardiogram report reviewed. No evidence for right heart strain.  -We'll transition to oral anticoagulation when chest tube is removed.  Right-sided pneumothorax with bleeding complication -Patient does have a history of COPD. Noted to have emphysema on previous CT scans. Could have had blebs, which could've resulted in pneumothorax.  Patient was seen by cardiothoracic surgery.  Appreciate their assistance. X-ray this morning showed persistent 5% pneumothorax.  Continue chest tube at present.  Anemia secondary to acute blood loss Hemoglobin dropped this morning but repeat H&H is stable. Continue to monitor every 6 hours. Transfuse as needed.  Acute on chronic kidney disease stage III. Baseline creatinine about 1.1-1.2. It was 1.6 at admission. Improved with gentle IV hydration.  History of COPD Appears to be stable. Continue nebulizer treatments.  History of essential hypertension. Continue to monitor blood pressures. Continue amlodipine. Hydralazine as needed. Lisinopril was held due to elevated creatinine. Continue to hold for at least 1 or 2 more days.   Cough. Hemoptysis initially now resolved. No evidence of active infection. Chest x-ray clear for any pneumonia. On Tussionex. Add Tessalon Perles scheduled as well as Mucinex.  Diet: Cardiac diet  Advance goals of care discussion: Full code  Family Communication: no family was present at bedside, at the time of interview.  Disposition:  Discharge to home.  Consultants: Cardiothoracic surgery Procedures: Chest tube placement  Antibiotics: Anti-infectives    None       Objective: Physical Exam: Vitals:   07/27/16 0351 07/27/16 0500 07/27/16 0736 07/27/16 1135  BP: 119/78  107/65 111/71  Pulse: 97  (!) 104 93  Resp: 16  (!) 23 17  Temp: 98.8 F (37.1 C)  99.3 F (37.4 C) 97.8 F (36.6 C)  TempSrc: Oral  Oral Oral  SpO2: 98%  96% 100%  Weight:  77.2 kg (170 lb 3.1 oz)    Height:        Intake/Output Summary (Last 24 hours) at 07/27/16 1527 Last data filed at 07/27/16 1434  Gross per 24 hour  Intake  620 ml  Output              775 ml  Net             -155 ml   Filed Weights   07/25/16 0500 07/26/16 0414 07/27/16 0500  Weight: 76.2 kg (167 lb 15.9 oz) 77.8 kg (171 lb 8.3 oz) 77.2 kg (170 lb 3.1 oz)   General: Alert, Awake and Oriented to Time, Place and Person.  Appear in moderate distress, affect appropriate Eyes: PERRL, Conjunctiva normal ENT: Oral Mucosa clear moist. Neck: no JVD, no Abnormal Mass Or lumps Cardiovascular: S1 and S2 Present, no Murmur, Peripheral Pulses Present Respiratory: normal respiratory effort, Bilateral Air entry equal and Decreased, no use of accessory muscle, bilateral Crackles, no wheezes Abdomen: Bowel Sound present, Soft and no tenderness, no hernia Skin: no redness, no Rash, no induration Extremities: no Pedal edema, no calf tenderness Neurologic: Grossly no focal neuro deficit. Bilaterally Equal motor strength Data Reviewed: CBC:  Recent Labs Lab 07/25/16 0258 07/26/16 0100 07/27/16 0248 07/27/16 0829 07/27/16 1425  WBC 8.8 8.4 7.9 8.3 10.6*  HGB 10.7* 10.2* 8.4* 8.6* 8.2*  HCT 32.0* 29.8* 25.3* 26.5* 24.7*  MCV 95.2 94.9 95.8 96.4 97.2  PLT 121* 125* 133* 141* 401*   Basic Metabolic Panel:  Recent Labs Lab 07/24/16 0326 07/25/16 0258 07/25/16 1123 07/27/16 0829  NA 140 137 137 135  K 4.0 4.1 3.8 3.5  CL 111 107 106 103  CO2 20* 23 25 26   GLUCOSE 136* 98 98 138*  BUN 17 10 9 10   CREATININE 1.29* 1.20 1.22 1.26*  CALCIUM 8.4* 8.2* 8.3* 8.3*    Liver Function Tests: No results for input(s): AST, ALT, ALKPHOS, BILITOT, PROT, ALBUMIN in the last 168 hours. No results for input(s): LIPASE, AMYLASE in the last 168 hours. No results for input(s): AMMONIA in the last 168 hours. Coagulation Profile:  Recent Labs Lab 07/23/16 2156  INR 1.25   Cardiac Enzymes:  Recent Labs Lab 07/23/16 2156 07/24/16 0326 07/24/16 0852  TROPONINI 0.04* 0.04* 0.04*   BNP (last 3 results) No results for input(s): PROBNP in the last 8760 hours. CBG:  Recent Labs Lab 07/24/16 0739 07/25/16 0708 07/26/16 0739 07/27/16 0738  GLUCAP 115* 90 89 111*   Studies: Dg Chest Port 1 View  Result Date: 07/27/2016 CLINICAL DATA:  Chest tube treatment of right-sided pneumothorax. History of COPD, pulmonary  embolism, acute on chronic renal failure. EXAM: PORTABLE CHEST 1 VIEW COMPARISON:  Portable chest x-ray of July 26, 2016 FINDINGS: The lungs are well-expanded. An approximately 5% right apical pneumothorax is demonstrated. The right chest tube overlies the posterior aspect of the third rib. There is no mediastinal shift. There is stable pleural thickening over the lateral aspect of the right eighth rib. The left lung is clear. The heart and pulmonary vascularity are normal. There is calcification in the wall of the aortic arch. IMPRESSION: 5% right apical pneumothorax. The right chest tube is in stable position. Otherwise stable appearance of the chest since yesterday's study. Electronically Signed   By: David  Martinique M.D.   On: 07/27/2016 07:11    Scheduled Meds: . amLODipine  10 mg Oral Daily  . benzonatate  100 mg Oral TID  . chlorpheniramine-HYDROcodone  5 mL Oral Q12H  . ezetimibe  10 mg Oral Daily  . feeding supplement (ENSURE ENLIVE)  237 mL Oral BID BM  . guaiFENesin  600 mg Oral BID  . mouth rinse  15  mL Mouth Rinse BID  . rosuvastatin  40 mg Oral q1800  . sodium chloride flush  3 mL Intravenous Q12H   Continuous Infusions: . heparin 1,000 Units/hr (07/27/16 0659)   PRN Meds: acetaminophen **OR** acetaminophen, albuterol, dextromethorphan-guaiFENesin, hydrALAZINE, ipratropium, morphine injection, ondansetron **OR** ondansetron (ZOFRAN) IV, oxyCODONE-acetaminophen, zolpidem  Time spent: 35 minutes  Author: Berle Mull, MD [facility] Pager: [phone] 07/27/2016 3:27 PM  If 7PM-7AM, please contact night-coverage at [email or web address], password [facility]

## 2016-07-27 NOTE — Progress Notes (Signed)
ANTICOAGULATION CONSULT NOTE - Follow Up Consult  Pharmacy Consult for Heparin  Indication: pulmonary embolus  No Known Allergies  Patient Measurements: Height: 6\' 4"  (193 cm) Weight: 170 lb 3.1 oz (77.2 kg) IBW/kg (Calculated) : 86.8  Assessment: 66 y/o M on started on heparin gtt for possible PE. Patient was bleeding from chest tube overnight so heparin stopped. MD stated risk of bleed is more than risk of possible PE. Heparin restarted on 6/5 at midnight but then paused at 0800 for chest tube placement and heparin level drawn 20 minutes after. Heparin level remains therapeutic. Cbc stable  Goal of Therapy:  Heparin level 0.3-0.5 units/ml Monitor platelets by anticoagulation protocol: Yes   Plan:  Continue heparin gtt at 1,000 units/hr Monitor daily heparin level, CBC, s/s of bleed  Levester Fresh, PharmD, BCPS, BCCCP Clinical Pharmacist Clinical phone for 07/27/2016 from 7a-3:30p: [phone] If after 3:30p, please call main pharmacy at: [phone] 07/27/2016 1:20 PM

## 2016-07-28 ENCOUNTER — Inpatient Hospital Stay (HOSPITAL_COMMUNITY): Payer: Managed Care, Other (non HMO)

## 2016-07-28 DIAGNOSIS — J9311 Primary spontaneous pneumothorax: Secondary | ICD-10-CM

## 2016-07-28 LAB — COMPREHENSIVE METABOLIC PANEL
ALBUMIN: 2.6 g/dL — AB (ref 3.5–5.0)
ALT: 16 U/L — AB (ref 17–63)
AST: 22 U/L (ref 15–41)
Alkaline Phosphatase: 37 U/L — ABNORMAL LOW (ref 38–126)
Anion gap: 6 (ref 5–15)
BUN: 12 mg/dL (ref 6–20)
CHLORIDE: 102 mmol/L (ref 101–111)
CO2: 26 mmol/L (ref 22–32)
CREATININE: 1.25 mg/dL — AB (ref 0.61–1.24)
Calcium: 8.3 mg/dL — ABNORMAL LOW (ref 8.9–10.3)
GFR calc Af Amer: >60 mL/min (ref >60)
GFR calc non Af Amer: 60 mL/min — ABNORMAL LOW (ref >60)
GLUCOSE: 105 mg/dL — AB (ref 65–99)
POTASSIUM: 3.8 mmol/L (ref 3.5–5.1)
SODIUM: 134 mmol/L — AB (ref 135–145)
Total Bilirubin: 0.8 mg/dL (ref 0.3–1.2)
Total Protein: 5.4 g/dL — ABNORMAL LOW (ref 6.5–8.1)

## 2016-07-28 LAB — CBC
HCT: 25 % — ABNORMAL LOW (ref 39.0–52.0)
HEMATOCRIT: 23.6 % — AB (ref 39.0–52.0)
Hemoglobin: 7.8 g/dL — ABNORMAL LOW (ref 13.0–17.0)
Hemoglobin: 8.3 g/dL — ABNORMAL LOW (ref 13.0–17.0)
MCH: 32 pg (ref 26.0–34.0)
MCH: 32 pg (ref 26.0–34.0)
MCHC: 33.1 g/dL (ref 30.0–36.0)
MCHC: 33.2 g/dL (ref 30.0–36.0)
MCV: 96.7 fL (ref 78.0–100.0)
MCV: 96.5 fL (ref 78.0–100.0)
PLATELETS: 168 10*3/uL (ref 150–400)
Platelets: 149 10*3/uL — ABNORMAL LOW (ref 150–400)
RBC: 2.44 MIL/uL — ABNORMAL LOW (ref 4.22–5.81)
RBC: 2.59 MIL/uL — AB (ref 4.22–5.81)
RDW: 13.2 % (ref 11.5–15.5)
RDW: 13.3 % (ref 11.5–15.5)
WBC: 10.9 10*3/uL — ABNORMAL HIGH (ref 4.0–10.5)
WBC: 11.2 10*3/uL — AB (ref 4.0–10.5)

## 2016-07-28 LAB — MAGNESIUM: MAGNESIUM: 2.1 mg/dL (ref 1.7–2.4)

## 2016-07-28 LAB — GLUCOSE, CAPILLARY: Glucose-Capillary: 96 mg/dL (ref 65–99)

## 2016-07-28 LAB — HEPARIN LEVEL (UNFRACTIONATED)
HEPARIN UNFRACTIONATED: 0.26 [IU]/mL — AB (ref 0.30–0.70)
Heparin Unfractionated: 0.36 IU/mL (ref 0.30–0.70)

## 2016-07-28 MED ORDER — SENNOSIDES-DOCUSATE SODIUM 8.6-50 MG PO TABS
1.0000 | ORAL_TABLET | Freq: Two times a day (BID) | ORAL | Status: DC
  Administered 2016-07-28 – 2016-07-30 (×6): 1 via ORAL
  Filled 2016-07-28 (×6): qty 1

## 2016-07-28 MED ORDER — DOCUSATE SODIUM 100 MG PO CAPS: 100.0000 mg | ORAL_CAPSULE | Freq: Two times a day (BID) | ORAL | Status: DC | PRN

## 2016-07-28 MED ORDER — POLYETHYLENE GLYCOL 3350 17 G PO PACK
17.0000 g | PACK | Freq: Every day | ORAL | Status: DC
  Administered 2016-07-28 – 2016-08-07 (×6): 17 g via ORAL
  Filled 2016-07-28 (×9): qty 1

## 2016-07-28 MED ORDER — FERROUS SULFATE 325 (65 FE) MG PO TABS
325.0000 mg | ORAL_TABLET | Freq: Three times a day (TID) | ORAL | Status: DC
  Administered 2016-07-28 – 2016-08-08 (×30): 325 mg via ORAL
  Filled 2016-07-28 (×29): qty 1

## 2016-07-28 NOTE — Progress Notes (Signed)
#   3. [facility] @ [employer] # [phone]   1. ELIQUIS 2.5 MG BID    AND  5 MG BID  COVER- YES                   YES  CO-PAY- $ 28.00                SAME  PRIOR APPROVAL - NO          SAME   APIXABAN - NONE FORMULARY   2.XARELTO  15 MG BID    AND  20 MG DAILY  COVER- YES                   YES  CO-PAY- $ 28.00                SAME  PRIOR APPROVAL- NO   RIVAROXABAN - NONE FORMULARY    PHARMACY : [facility]

## 2016-07-28 NOTE — Progress Notes (Signed)
RN notified of worsening right sided pneumothorax.  Dr. Roxan Hockey updated on patients change in condition, received no new orders at this time.  Will continue to monitor.

## 2016-07-28 NOTE — Progress Notes (Signed)
ANTICOAGULATION CONSULT NOTE - Follow Up Consult  Pharmacy Consult for Heparin  Indication: pulmonary embolus  Allergies  Allergen Reactions  . No Known Allergies     Patient Measurements: Height: 6\' 4"  (193 cm) Weight: 172 lb 13.5 oz (78.4 kg) IBW/kg (Calculated) : 86.8  Vital Signs: Temp: 98.5 F (36.9 C) (06/08 1228) Temp Source: Oral (06/08 1228) BP: 125/74 (06/08 0755) Pulse Rate: 90 (06/08 0755)  Labs:  Recent Labs  07/27/16 0248 07/27/16 0829  07/27/16 1945 07/28/16 0157 07/28/16 0721 07/28/16 1230  HGB 8.4* 8.6*  < > 8.0* 7.8* 8.3*  --   HCT 25.3* 26.5*  < > 24.1* 23.6* 25.0*  --   PLT 133* 141*  < > 146* 149* 168  --   HEPARINUNFRC 0.37  --   --   --  0.26*  --  0.36  CREATININE  --  1.26*  --   --  1.25*  --   --   < > = values in this interval not displayed.  Estimated Creatinine Clearance: 67.1 mL/min (A) (by C-G formula based on SCr of 1.25 mg/dL (H)).  Assessment: 66 y/o M on heparin drip for acute PE diagnosed at OSH per notes. Heparin level is therapeutic after most recent adjustment. No issues with infusion. Hgb remains low stable with no overt sign of bleeding.   Goal of Therapy:  Heparin level 0.3-0.5 units/ml Monitor platelets by anticoagulation protocol: Yes   Plan:  1. Continue heparin infusion at 1100 units/hr 2. Recheck heparin level with am labs since confirmation level would be due in close proximity to am heparin level  Vincenza Hews, PharmD, BCPS 07/28/2016, 2:04 PM

## 2016-07-28 NOTE — Progress Notes (Signed)
[facility] Progress Note  Patient: Charles [name]:[id number]   PCP: Biagio Borg, MD DOB: April 08, 1950   DOA: 07/23/2016   DOS: 07/28/2016   Date of Service: the patient was seen and examined on 07/28/2016  Subjective: Significant improvement in cough. No chest pain or shortness of breath. Mentions that he is feeling better.  Brief hospital course: 66 year old African-American male with a past medical history of hypertension, hyperlipidemia, COPD, stroke, history of PE in 2016, but no longer on anticoagulation, presented with shortness of breath. He was evaluated at an outside facility. Evaluation revealed bilateral pulmonary embolism but also showed right-sided pneumothorax. A chest tube was placed, he was started on heparin and the patient was transferred to this hospital. Cardiology surgery currently following the chest tube. Pneumothorax still present. Currently further plan is continue monitor.  Assessment and Plan: Acute pulmonary embolism Along with Left lower extremity DVT -PE was detected on CT scan done at the outside facility. This is patient's second episode of PE(Previous on 12/15).  -Patient was started on IV heparin but then he started bleeding into the chest tube site.  -Initial chest tube accidentally pulled out, re-placed by Dr. Roxan Hockey on 07/25/2016. -Echocardiogram report reviewed. No evidence for right heart strain.  -We'll transition to oral anticoagulation when chest tube is removed.  Right-sided pneumothorax with bleeding complication -Patient does have a history of COPD. Noted to have emphysema on previous CT scans. Could have had blebs, which could've resulted in pneumothorax.  Patient was seen by cardiothoracic surgery. Appreciate their assistance. X-ray this morning showed increasing pneumothorax now back and suction.  Continue chest tube at present.  Anemia secondary to acute blood loss Hemoglobin now stable. Continue to monitor every 24 hours.  Transfuse as needed.  Acute on chronic kidney disease stage III. Baseline creatinine about 1.1-1.2. It was 1.6 at admission. Improved with gentle IV hydration.  History of COPD Appears to be stable. Continue nebulizer treatments.  History of essential hypertension. Continue to monitor blood pressures. Continue amlodipine. Hydralazine as needed. Lisinopril was held due to elevated creatinine. Continue to hold for at least 1 or 2 more days.   Cough. Hemoptysis initially now resolved. No evidence of active infection. Chest x-ray clear for any pneumonia. On Tussionex. Add Tessalon Perles scheduled as well as Mucinex.  Diet: Cardiac diet  Advance goals of care discussion: Full code  Family Communication: no family was present at bedside, at the time of interview.  Disposition:  Discharge to home.  Consultants: Cardiothoracic surgery Procedures: Chest tube placement  Antibiotics: Anti-infectives    None       Objective: Physical Exam: Vitals:   07/28/16 0337 07/28/16 0755 07/28/16 1228 07/28/16 1500  BP: 122/65 125/74  133/80  Pulse: 74 90    Resp: 16 (!) 22    Temp: 98.2 F (36.8 C) 98.9 F (37.2 C) 98.5 F (36.9 C) 99.5 F (37.5 C)  TempSrc: Oral Oral Oral Tympanic  SpO2: 99% 97%    Weight: 78.4 kg (172 lb 13.5 oz)     Height:        Intake/Output Summary (Last 24 hours) at 07/28/16 1800 Last data filed at 07/28/16 1500  Gross per 24 hour  Intake            440.6 ml  Output             2150 ml  Net          -1709.4 ml   [employer]  07/26/16 0414 07/27/16 0500 07/28/16 0337  Weight: 77.8 kg (171 lb 8.3 oz) 77.2 kg (170 lb 3.1 oz) 78.4 kg (172 lb 13.5 oz)   General: Alert, Awake and Oriented to Time, Place and Person. Appear in mild distress, affect appropriate ENT: Oral Mucosa clear moist. Neck: no JVD, no Abnormal Mass Or lumps Cardiovascular: S1 and S2 Present, no Murmur, Peripheral Pulses Present Respiratory: normal respiratory effort, Bilateral  Air entry equal and Decreased, no use of accessory muscle, right Crackles, no wheezes Abdomen: Bowel Sound present, Soft and no tenderness, no hernia Skin: no redness, no Rash, no induration Extremities: no Pedal edema, no calf tenderness Neurologic: Grossly no focal neuro deficit. Bilaterally Equal motor strength  Data Reviewed: CBC:  Recent Labs Lab 07/27/16 0829 07/27/16 1425 07/27/16 1945 07/28/16 0157 07/28/16 0721  WBC 8.3 10.6* 10.6* 10.9* 11.2*  HGB 8.6* 8.2* 8.0* 7.8* 8.3*  HCT 26.5* 24.7* 24.1* 23.6* 25.0*  MCV 96.4 97.2 97.2 96.7 96.5  PLT 141* 149* 146* 149* 710   Basic Metabolic Panel:  Recent Labs Lab 07/24/16 0326 07/25/16 0258 07/25/16 1123 07/27/16 0829 07/28/16 0157  NA 140 137 137 135 134*  K 4.0 4.1 3.8 3.5 3.8  CL 111 107 106 103 102  CO2 20* 23 25 26 26   GLUCOSE 136* 98 98 138* 105*  BUN 17 10 9 10 12   CREATININE 1.29* 1.20 1.22 1.26* 1.25*  CALCIUM 8.4* 8.2* 8.3* 8.3* 8.3*  MG  --   --   --   --  2.1    Liver Function Tests:  Recent Labs Lab 07/28/16 0157  AST 22  ALT 16*  ALKPHOS 37*  BILITOT 0.8  PROT 5.4*  ALBUMIN 2.6*   No results for input(s): LIPASE, AMYLASE in the last 168 hours. No results for input(s): AMMONIA in the last 168 hours. Coagulation Profile:  Recent Labs Lab 07/23/16 2156  INR 1.25   Cardiac Enzymes:  Recent Labs Lab 07/23/16 2156 07/24/16 0326 07/24/16 0852  TROPONINI 0.04* 0.04* 0.04*   BNP (last 3 results) No results for input(s): PROBNP in the last 8760 hours. CBG:  Recent Labs Lab 07/24/16 0739 07/25/16 0708 07/26/16 0739 07/27/16 0738 07/28/16 0740  GLUCAP 115* 90 89 111* 96   Studies: Dg Chest Port 1 View  Result Date: 07/28/2016 CLINICAL DATA:  Pneumothorax.  Chest tube . EXAM: PORTABLE CHEST 1 VIEW COMPARISON:  07/27/2016 .  07/26/2016.  CT 01/28/2016. FINDINGS: Right chest tube in stable position. Interim slight worsening of known right-sided pneumothorax with apical and right  base pneumothorax noted. Heart size normal. No pleural effusion. IMPRESSION: Right chest tube in stable position. Interim slight worsening of of known right-sided pneumothorax with apical and right base components. These results will be called to the ordering clinician or representative by the Radiologist Assistant, and communication documented in the PACS or zVision Dashboard. Electronically Signed   By: [place]   On: 07/28/2016 07:16    Scheduled Meds: . amLODipine  10 mg Oral Daily  . benzonatate  100 mg Oral TID  . chlorpheniramine-HYDROcodone  5 mL Oral Q12H  . ezetimibe  10 mg Oral Daily  . feeding supplement (ENSURE ENLIVE)  237 mL Oral BID BM  . ferrous sulfate  325 mg Oral TID WC  . guaiFENesin  600 mg Oral BID  . mouth rinse  15 mL Mouth Rinse BID  . polyethylene glycol  17 g Oral Daily  . rosuvastatin  40 mg Oral q1800  .  senna-docusate  1 tablet Oral BID  . sodium chloride flush  3 mL Intravenous Q12H   Continuous Infusions: . heparin 1,100 Units/hr (07/28/16 1500)   PRN Meds: acetaminophen **OR** acetaminophen, albuterol, dextromethorphan-guaiFENesin, hydrALAZINE, ipratropium, morphine injection, ondansetron **OR** ondansetron (ZOFRAN) IV, oxyCODONE-acetaminophen, zolpidem  Time spent: 30 minutes  Author: Berle Mull, MD [facility] Pager: [phone] 07/28/2016 6:00 PM  If 7PM-7AM, please contact night-coverage at [email or web address], password [facility]

## 2016-07-28 NOTE — Progress Notes (Addendum)
[address]       [place] [address]             [phone]         Subjective: Breathing comfortably  Objective: Vital signs in last 24 hours: Temp:  [97.8 F (36.6 C)-98.8 F (37.1 C)] 98.2 F (36.8 C) (06/08 0337) Pulse Rate:  [74-105] 74 (06/08 0337) Cardiac Rhythm: Normal sinus rhythm (06/08 0729) Resp:  [15-17] 16 (06/08 0337) BP: (101-140)/(65-85) 122/65 (06/08 0337) SpO2:  [98 %-100 %] 99 % (06/08 0337) Weight:  [172 lb 13.5 oz (78.4 kg)] 172 lb 13.5 oz (78.4 kg) (06/08 0337)  Hemodynamic parameters for last 24 hours:    Intake/Output from previous day: 06/07 0701 - 06/08 0700 In: 852.6 [P.O.:620; I.V.:232.6] Out: 1050 [Urine:1050] Intake/Output this shift: No intake/output data recorded.  General appearance: alert, cooperative and no distress Heart: regular rate and rhythm Lungs: clear to auscultation bilaterally Abdomen: benign Extremities: no edema or calf tenderness Wound: dressings curently clean and dry  Lab Results:  Recent Labs  07/27/16 1945 07/28/16 0157  WBC 10.6* 10.9*  HGB 8.0* 7.8*  HCT 24.1* 23.6*  PLT 146* 149*   BMET:  Recent Labs  07/27/16 0829 07/28/16 0157  NA 135 134*  K 3.5 3.8  CL 103 102  CO2 26 26  GLUCOSE 138* 105*  BUN 10 12  CREATININE 1.26* 1.25*  CALCIUM 8.3* 8.3*    PT/INR: No results for input(s): LABPROT, INR in the last 72 hours. ABG No results found for: PHART, HCO3, TCO2, ACIDBASEDEF, O2SAT CBG (last 3)   Recent Labs  07/26/16 0739 07/27/16 0738 07/28/16 0740  GLUCAP 89 111* 96    Meds Scheduled Meds: . amLODipine  10 mg Oral Daily  . benzonatate  100 mg Oral TID  . chlorpheniramine-HYDROcodone  5 mL Oral Q12H  . ezetimibe  10 mg Oral Daily  . feeding supplement (ENSURE ENLIVE)  237 mL Oral BID BM  . guaiFENesin  600 mg Oral BID  . mouth rinse  15 mL Mouth Rinse BID  . rosuvastatin  40 mg Oral q1800  . sodium chloride flush  3 mL Intravenous Q12H    Continuous Infusions: . heparin 1,100 Units/hr (07/28/16 0700)   PRN Meds:.acetaminophen **OR** acetaminophen, albuterol, dextromethorphan-guaiFENesin, hydrALAZINE, ipratropium, morphine injection, ondansetron **OR** ondansetron (ZOFRAN) IV, oxyCODONE-acetaminophen, zolpidem  Xrays Dg Chest Port 1 View  Result Date: 07/28/2016 CLINICAL DATA:  Pneumothorax.  Chest tube . EXAM: PORTABLE CHEST 1 VIEW COMPARISON:  07/27/2016 .  07/26/2016.  CT 01/28/2016. FINDINGS: Right chest tube in stable position. Interim slight worsening of known right-sided pneumothorax with apical and right base pneumothorax noted. Heart size normal. No pleural effusion. IMPRESSION: Right chest tube in stable position. Interim slight worsening of of known right-sided pneumothorax with apical and right base components. These results will be called to the ordering clinician or representative by the Radiologist Assistant, and communication documented in the PACS or zVision Dashboard. Electronically Signed   By: Marcello Moores  Register   On: 07/28/2016 07:16   Dg Chest Port 1 View  Result Date: 07/27/2016 CLINICAL DATA:  Chest tube treatment of right-sided pneumothorax. History of COPD, pulmonary embolism, acute on chronic renal failure. EXAM: PORTABLE CHEST 1 VIEW COMPARISON:  Portable chest x-ray of July 26, 2016 FINDINGS: The lungs are well-expanded. An approximately 5% right apical pneumothorax is demonstrated. The right chest tube overlies the posterior aspect of the third rib. There is no mediastinal shift.  There is stable pleural thickening over the lateral aspect of the right eighth rib. The left lung is clear. The heart and pulmonary vascularity are normal. There is calcification in the wall of the aortic arch. IMPRESSION: 5% right apical pneumothorax. The right chest tube is in stable position. Otherwise stable appearance of the chest since yesterday's study. Electronically Signed   By: David  Martinique M.D.   On: 07/27/2016 07:11   Dg  Chest Port 1 View  Result Date: 07/26/2016 CLINICAL DATA:  Chest tube follow-up EXAM: PORTABLE CHEST 1 VIEW COMPARISON:  Yesterday FINDINGS: Stable positioning of right apical chest tube. No convincingly seen pneumothorax. Mild atelectasis at the right base. The left lung is clear. Normal heart size and mediastinal contours. IMPRESSION: Stable chest tube positioning.  No visible pneumothorax. Electronically Signed   By: Monte Fantasia M.D.   On: 07/26/2016 10:09    Assessment/Plan:  1 clinically stable on H2O seal, slightly increased PNTX- keep tube for now 2 not bleeding from CT site, HGB down a bit further- tolerating clinically , will add iron po- guiac stool just in case CT site doesn't explain Hgb drop completely   LOS: 5 days    [name],Charles E 07/28/2016 Patient seen and examined, agree with above' He has a hematoma at the chest tube site but no active bleeding at present Increased pneumothorax on CXR this AM- back to suction. No air leak at present Will try repeat trial of water seal over the weekend to see if we can get the tube out. If not, will plan to proceed with Right VATS, bleb resection on Monday. I discussed the general nature of the procedure, the need for general anesthesia,and the incisions to be used with Charles [name]. We discussed the expected hospital stay, overall recovery and short and long term outcomes. I informed him of the indications, risks, benefits and alternatives. He understands the risks include, but are not limited to death, stroke, MI, DVT/PE, bleeding, possible need for transfusion, infections,other organ system dysfunction including respiratory, renal, or GI complications. He understands the high risk nature of the procedure given his recent pulmonary emboli and issues related to anticoagulation. He accepts the risks and agrees to proceed.  Revonda Standard Roxan Hockey, MD [facility] [phone]

## 2016-07-28 NOTE — Progress Notes (Signed)
ANTICOAGULATION CONSULT NOTE - Follow Up Consult  Pharmacy Consult for Heparin  Indication: pulmonary embolus  No Known Allergies  Patient Measurements: Height: 6\' 4"  (193 cm) Weight: 170 lb 3.1 oz (77.2 kg) IBW/kg (Calculated) : 86.8  Vital Signs: Temp: 98.4 F (36.9 C) (06/07 2318) Temp Source: Oral (06/07 2318) BP: 131/85 (06/07 2318) Pulse Rate: 105 (06/07 2318)  Labs:  Recent Labs  07/25/16 1123  07/26/16 0100 07/27/16 0248 07/27/16 0829 07/27/16 1425 07/27/16 1945 07/28/16 0157  HGB  --   < > 10.2* 8.4* 8.6* 8.2* 8.0* 7.8*  HCT  --   < > 29.8* 25.3* 26.5* 24.7* 24.1* 23.6*  PLT  --   < > 125* 133* 141* 149* 146* 149*  HEPARINUNFRC  --   < > 0.44 0.37  --   --   --  0.26*  CREATININE 1.22  --   --   --  1.26*  --   --  1.25*  < > = values in this interval not displayed.  Estimated Creatinine Clearance: 66 mL/min (A) (by C-G formula based on SCr of 1.25 mg/dL (H)).  Assessment: 66 y/o M on heparin drip for possible PE, was previously stopped for some chest tube bleeding but is now re-started. Heparin level is sub-therapeutic, no issues per RN.   Goal of Therapy:  Heparin level 0.3-0.5 units/ml Monitor platelets by anticoagulation protocol: Yes   Plan:  Inc heparin drip to 1100 units/hr 1200 HL  Narda Bonds 07/28/2016,3:33 AM

## 2016-07-28 NOTE — Discharge Instructions (Signed)

## 2016-07-29 ENCOUNTER — Inpatient Hospital Stay (HOSPITAL_COMMUNITY): Payer: Managed Care, Other (non HMO)

## 2016-07-29 LAB — CBC
HEMATOCRIT: 23.9 % — AB (ref 39.0–52.0)
HEMATOCRIT: 26.3 % — AB (ref 39.0–52.0)
HEMOGLOBIN: 7.7 g/dL — AB (ref 13.0–17.0)
HEMOGLOBIN: 8.7 g/dL — AB (ref 13.0–17.0)
MCH: 31.3 pg (ref 26.0–34.0)
MCH: 31.4 pg (ref 26.0–34.0)
MCHC: 32.2 g/dL (ref 30.0–36.0)
MCHC: 33.1 g/dL (ref 30.0–36.0)
MCV: 97.2 fL (ref 78.0–100.0)
MCV: 94.9 fL (ref 78.0–100.0)
Platelets: 205 10*3/uL (ref 150–400)
Platelets: 189 10*3/uL (ref 150–400)
RBC: 2.46 MIL/uL — ABNORMAL LOW (ref 4.22–5.81)
RBC: 2.77 MIL/uL — ABNORMAL LOW (ref 4.22–5.81)
RDW: 13.6 % (ref 11.5–15.5)
RDW: 15.7 % — ABNORMAL HIGH (ref 11.5–15.5)
WBC: 11.7 10*3/uL — AB (ref 4.0–10.5)
WBC: 12.7 10*3/uL — AB (ref 4.0–10.5)

## 2016-07-29 LAB — BASIC METABOLIC PANEL
ANION GAP: 8 (ref 5–15)
BUN: 9 mg/dL (ref 6–20)
CO2: 27 mmol/L (ref 22–32)
Calcium: 8.7 mg/dL — ABNORMAL LOW (ref 8.9–10.3)
Chloride: 102 mmol/L (ref 101–111)
Creatinine, Ser: 1.25 mg/dL — ABNORMAL HIGH (ref 0.61–1.24)
GFR calc Af Amer: >60 mL/min (ref >60)
GFR, EST NON AFRICAN AMERICAN: 60 mL/min — AB (ref >60)
Glucose, Bld: 102 mg/dL — ABNORMAL HIGH (ref 65–99)
Potassium: 4.2 mmol/L (ref 3.5–5.1)
SODIUM: 137 mmol/L (ref 135–145)

## 2016-07-29 LAB — HEPARIN LEVEL (UNFRACTIONATED): HEPARIN UNFRACTIONATED: 0.37 [IU]/mL (ref 0.30–0.70)

## 2016-07-29 LAB — GLUCOSE, CAPILLARY: GLUCOSE-CAPILLARY: 86 mg/dL (ref 65–99)

## 2016-07-29 LAB — OCCULT BLOOD X 1 CARD TO LAB, STOOL: Fecal Occult Bld: NEGATIVE

## 2016-07-29 LAB — PREPARE RBC (CROSSMATCH)

## 2016-07-29 MED ORDER — BISACODYL 10 MG RE SUPP
10.0000 mg | Freq: Once | RECTAL | Status: DC
  Filled 2016-07-29: qty 1

## 2016-07-29 MED ORDER — MAGNESIUM CITRATE PO SOLN
1.0000 | Freq: Once | ORAL | Status: AC
  Administered 2016-07-29: 1 via ORAL
  Filled 2016-07-29 (×2): qty 296

## 2016-07-29 MED ORDER — SODIUM CHLORIDE 0.9 % IV SOLN
Freq: Once | INTRAVENOUS | Status: AC
  Administered 2016-07-29: 12:00:00 via INTRAVENOUS

## 2016-07-29 NOTE — Progress Notes (Addendum)
Patient does NOT have an air leak. Chest xray done this am shows stable small right apical and basilar pneumothorax. As discussed with Dr. Cyndia Bent, place chest tube to water seal. I spoke with nurse.

## 2016-07-29 NOTE — Progress Notes (Signed)
ANTICOAGULATION CONSULT NOTE - Follow Up Consult  Pharmacy Consult for Heparin  Indication: pulmonary embolus  Assessment: 66 y/o M on heparin drip for acute PE diagnosed at OSH per notes. 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 Heparin level is therapeutic (0.37) on 1100 units/hr. Hgb remains low stable with no overt sign of bleeding.   Of note, Scr stable at 1.25 with CrCl ~38 ml/min.   Goal of Therapy:  Heparin level 0.3-0.5 units/ml Monitor platelets by anticoagulation protocol: Yes   Plan:  1. Continue heparin infusion at 1100 units/hr 2. Monitor heparin level with am labs, daily CBC 3. Follow-up plan to transition to oral anticoagulation   Allergies  Allergen Reactions  . No Known Allergies     Patient Measurements: Height: 6\' 4"  (193 cm) Weight: 173 lb 1 oz (78.5 kg) IBW/kg (Calculated) : 86.8  Vital Signs: Temp: 98.7 F (37.1 C) (06/09 0742) Temp Source: Oral (06/09 0742) BP: 152/94 (06/09 0742) Pulse Rate: 94 (06/09 0320)  Labs:  Recent Labs  07/27/16 0829  07/28/16 0157 07/28/16 0721 07/28/16 1230 07/29/16 0338  HGB 8.6*  < > 7.8* 8.3*  --  7.7*  HCT 26.5*  < > 23.6* 25.0*  --  23.9*  PLT 141*  < > 149* 168  --  205  HEPARINUNFRC  --   --  0.26*  --  0.36 0.37  CREATININE 1.26*  --  1.25*  --   --  1.25*  < > = values in this interval not displayed.  Estimated Creatinine Clearance: 67.2 mL/min (A) (by C-G formula based on SCr of 1.25 mg/dL (H)).  Belia Heman, PharmD PGY1 Pharmacy Resident [phone] (Pager) 07/29/2016 7:54 AM

## 2016-07-29 NOTE — Progress Notes (Addendum)
      [address]       [place] [address]             [phone]         Procedure(s) (LRB): VIDEO ASSISTED THORACOSCOPY (Right) BLEBECTOMY (Right)  Subjective: Patient in good spirits this am. He has no specific complaints except wishes CXR was ordered.  Objective: Vital signs in last 24 hours: Temp:  [98.2 F (36.8 C)-99.5 F (37.5 C)] 98.7 F (37.1 C) (06/09 0742) Pulse Rate:  [94-106] 94 (06/09 0320) Cardiac Rhythm: Normal sinus rhythm (06/09 0750) Resp:  [18-25] 18 (06/09 0320) BP: (119-152)/(69-94) 152/94 (06/09 0742) SpO2:  [93 %-98 %] 93 % (06/09 0742) Weight:  [78.5 kg (173 lb 1 oz)] 78.5 kg (173 lb 1 oz) (06/09 0320)     Intake/Output from previous day: 06/08 0701 - 06/09 0700 In: 274.8 [I.V.:274.8] Out: 2355 [Urine:2325; Chest Tube:30]   Physical Exam:  Cardiovascular: Tachycardic Pulmonary: Mostly clear to auscultation bilaterally Wounds: Hematoma. Dried blood on dressing-no active bleeding this am. Chest Tube: to suction, no air leak  Lab Results: CBC: Recent Labs  07/28/16 0721 07/29/16 0338  WBC 11.2* 11.7*  HGB 8.3* 7.7*  HCT 25.0* 23.9*  PLT 168 205   BMET:  Recent Labs  07/28/16 0157 07/29/16 0338  NA 134* 137  K 3.8 4.2  CL 102 102  CO2 26 27  GLUCOSE 105* 102*  BUN 12 9  CREATININE 1.25* 1.25*  CALCIUM 8.3* 8.7*    PT/INR: No results for input(s): LABPROT, INR in the last 72 hours. ABG:  INR: Will add last result for INR, ABG once components are confirmed Will add last 4 CBG results once components are confirmed  Assessment/Plan:  1. CV - Tachycardic. On Amlodipine 10 mg daily. 2.  Pulmonary - Chest tube with 30 cc of output last 24 hours. Chest tube is to suction. There is no air leak. No CXR ordered for this am so will order. Hope to place chest tube to water seal soon.On Mucinex, Tussionex, and Benzonatate for cough. Check CXR in am. 3. PE-on Heparin drip, per pharmacy 4. Anemia-H and H  decerased to 7.7 and 23.9. On Ferrous sulfate 325 mg tid. 5. Management per medicine  ZIMMERMAN,DONIELLE MPA-C 07/29/2016,9:01 AM  CXR unchanged from yesterday showing a right basilar and small apical ptx. There is no air leak from the chest tube with coughing so will put to water seal. I am not sure why the residual ptx is not being drained. Will check CXR in the am.

## 2016-07-29 NOTE — Progress Notes (Signed)
[facility] Progress Note  Patient: Charles [name] POE:[id number]   PCP: Biagio Borg, MD DOB: [birth year]   DOA: 07/23/2016   DOS: 07/29/2016   Date of Service: the patient was seen and examined on 07/29/2016  Subjective: Feeling better, no chest pain or shortness of breath. Heart rate elevated. Remains constipated. Tolerating clear liquid diet. No active bleeding reported. No headache no focal deficit.  Brief hospital course: 66 year old African-American male with a past medical history of hypertension, hyperlipidemia, COPD, stroke, history of PE in 2016, but no longer on anticoagulation, presented with shortness of breath. He was evaluated at an outside facility. Evaluation revealed bilateral pulmonary embolism but also showed right-sided pneumothorax. A chest tube was placed, he was started on heparin and the patient was transferred to this hospital. Cardiology surgery currently following the chest tube. Pneumothorax still present. Currently further plan is to monitor for resolution of pneumothorax.  Assessment and Plan: Acute pulmonary embolism Along with Left lower extremity DVT -PE was detected on CT scan done at the outside facility.  This is patient's second episode of PE(Previous on 12/15).  -Patient was started on IV heparin -Echocardiogram report reviewed. No evidence for right heart strain.  -We'll transition to oral anticoagulation when chest tube is removed.  Right-sided spontaneous pneumothorax with bleeding complication -Patient does have a history of COPD. Noted to have emphysema on previous CT scans. Could have had blebs, which could've resulted in pneumothorax.  Initially had chest catheter placed in ED in [place], which got dislodged. Patient was seen by cardiothoracic surgery.  Chest tube inserted on 07/25/2016. Cardiothoracic surgery currently managing chest tube, if pneumothorax does not resolve patient is tentatively scheduled for VATS on Monday with bleb  resection. Appreciate their assistance.   Anemia secondary to acute blood loss Now symptomatic with tachycardia and fatigue. Transfuse 1 PRBC. Repeat CBC after that. Continue to monitor every 24 hours. Transfuse as needed.  Acute on chronic kidney disease stage III. Baseline creatinine about 1.1-1.2. It was 1.6 at admission. Improved with gentle IV hydration.  History of COPD Appears to be stable. Continue nebulizer treatments.  History of essential hypertension. Continue to monitor blood pressures. Continue amlodipine. Hydralazine as needed. Lisinopril was held due to elevated creatinine. Continue to hold for at least 1 or 2 more days.   Cough. Hemoptysis initially now resolved. No evidence of active infection. Chest x-ray clear for any pneumonia. On Tussionex, Tessalon Perles scheduled as well as Mucinex.  Constipation. Patient has been on MiraLAX and Senokot and still has no BM. Has bowel sound present, passing gas, no nausea no vomiting. Advance diet to soft diet at present. Add magnesium citrate as well as Dulcolax suppository.  Generalized weakness. Physical therapy consulted.  Sinus tachycardia. Likely associated with anemia or pain. We'll transfuse 1 PRBC.  Diet: Soft diet  Advance goals of care discussion: Full code  Family Communication: no family was present at bedside, at the time of interview.   Disposition:  Discharge to .  Consultants: Cardiothoracic surgery Procedures: Chest tube placement Echo  Antibiotics: Anti-infectives    None       Objective: Physical Exam: Vitals:   07/28/16 1918 07/28/16 2336 07/29/16 0320 07/29/16 0742  BP: 119/78 136/82 (!) 141/69 (!) 152/94  Pulse: (!) 106 98 94   Resp: (!) 25 18 18    Temp: 98.3 F (36.8 C) 98.2 F (36.8 C) 98.4 F (36.9 C) 98.7 F (37.1 C)  TempSrc: Oral Oral Oral Oral  SpO2: 98% 98% 94%  93%  Weight:   78.5 kg (173 lb 1 oz)   Height:        Intake/Output Summary (Last 24 hours) at  07/29/16 1007 Last data filed at 07/29/16 0800  Gross per 24 hour  Intake           252.82 ml  Output             1955 ml  Net         -1702.18 ml   Filed Weights   07/27/16 0500 07/28/16 0337 07/29/16 0320  Weight: 77.2 kg (170 lb 3.1 oz) 78.4 kg (172 lb 13.5 oz) 78.5 kg (173 lb 1 oz)   General: Alert, Awake and Oriented to Time, Place and Person. Appear in mild distress, affect appropriate ENT: Oral Mucosa clear moist. Neck: no JVD, no Abnormal Mass Or lumps Cardiovascular: S1 and S2 Present, no Murmur, Peripheral Pulses Present Respiratory: normal respiratory effort, Bilateral Air entry equal and Decreased, no use of accessory muscle, basal Crackles, no wheezes Abdomen: Bowel Sound present, Soft and no tenderness, no hernia Skin: on redness, on Rash, on induration Extremities: on Pedal edema, on calf tenderness Neurologic: Grossly no focal neuro deficit. Bilaterally Equal motor strength  Data Reviewed: CBC:  Recent Labs Lab 07/27/16 1425 07/27/16 1945 07/28/16 0157 07/28/16 0721 07/29/16 0338  WBC 10.6* 10.6* 10.9* 11.2* 11.7*  HGB 8.2* 8.0* 7.8* 8.3* 7.7*  HCT 24.7* 24.1* 23.6* 25.0* 23.9*  MCV 97.2 97.2 96.7 96.5 97.2  PLT 149* 146* 149* 168 009   Basic Metabolic Panel:  Recent Labs Lab 07/25/16 0258 07/25/16 1123 07/27/16 0829 07/28/16 0157 07/29/16 0338  NA 137 137 135 134* 137  K 4.1 3.8 3.5 3.8 4.2  CL 107 106 103 102 102  CO2 23 25 26 26 27   GLUCOSE 98 98 138* 105* 102*  BUN 10 9 10 12 9   CREATININE 1.20 1.22 1.26* 1.25* 1.25*  CALCIUM 8.2* 8.3* 8.3* 8.3* 8.7*  MG  --   --   --  2.1  --     Liver Function Tests:  Recent Labs Lab 07/28/16 0157  AST 22  ALT 16*  ALKPHOS 37*  BILITOT 0.8  PROT 5.4*  ALBUMIN 2.6*   No results for input(s): LIPASE, AMYLASE in the last 168 hours. No results for input(s): AMMONIA in the last 168 hours. Coagulation Profile:  Recent Labs Lab 07/23/16 2156  INR 1.25   Cardiac Enzymes:  Recent Labs Lab  07/23/16 2156 07/24/16 0326 07/24/16 0852  TROPONINI 0.04* 0.04* 0.04*   BNP (last 3 results) No results for input(s): PROBNP in the last 8760 hours. CBG:  Recent Labs Lab 07/25/16 0708 07/26/16 0739 07/27/16 0738 07/28/16 0740 07/29/16 0741  GLUCAP 90 89 111* 96 86   Studies: No results found.  Scheduled Meds: . amLODipine  10 mg Oral Daily  . benzonatate  100 mg Oral TID  . bisacodyl  10 mg Rectal Once  . chlorpheniramine-HYDROcodone  5 mL Oral Q12H  . ezetimibe  10 mg Oral Daily  . feeding supplement (ENSURE ENLIVE)  237 mL Oral BID BM  . ferrous sulfate  325 mg Oral TID WC  . guaiFENesin  600 mg Oral BID  . magnesium citrate  1 Bottle Oral Once  . mouth rinse  15 mL Mouth Rinse BID  . polyethylene glycol  17 g Oral Daily  . rosuvastatin  40 mg Oral q1800  . senna-docusate  1 tablet Oral BID  . sodium chloride  flush  3 mL Intravenous Q12H   Continuous Infusions: . sodium chloride    . heparin 1,100 Units/hr (07/29/16 0917)   PRN Meds: acetaminophen **OR** acetaminophen, albuterol, dextromethorphan-guaiFENesin, hydrALAZINE, ipratropium, morphine injection, ondansetron **OR** ondansetron (ZOFRAN) IV, oxyCODONE-acetaminophen, zolpidem  Time spent: 35 minutes  Author: Berle Mull, MD [facility] Pager: [phone] 07/29/2016 10:07 AM  If 7PM-7AM, please contact night-coverage at [email or web address], password [facility]

## 2016-07-30 ENCOUNTER — Inpatient Hospital Stay (HOSPITAL_COMMUNITY): Payer: Managed Care, Other (non HMO)

## 2016-07-30 LAB — CBC
HEMATOCRIT: 27.6 % — AB (ref 39.0–52.0)
Hemoglobin: 8.9 g/dL — ABNORMAL LOW (ref 13.0–17.0)
MCH: 31 pg (ref 26.0–34.0)
MCHC: 32.2 g/dL (ref 30.0–36.0)
MCV: 96.2 fL (ref 78.0–100.0)
PLATELETS: 212 10*3/uL (ref 150–400)
RBC: 2.87 MIL/uL — ABNORMAL LOW (ref 4.22–5.81)
RDW: 16.4 % — AB (ref 11.5–15.5)
WBC: 13.3 10*3/uL — AB (ref 4.0–10.5)

## 2016-07-30 LAB — BASIC METABOLIC PANEL
Anion gap: 6 (ref 5–15)
BUN: 8 mg/dL (ref 6–20)
CO2: 28 mmol/L (ref 22–32)
Calcium: 8.6 mg/dL — ABNORMAL LOW (ref 8.9–10.3)
Chloride: 104 mmol/L (ref 101–111)
Creatinine, Ser: 1.29 mg/dL — ABNORMAL HIGH (ref 0.61–1.24)
GFR calc Af Amer: >60 mL/min (ref >60)
GFR, EST NON AFRICAN AMERICAN: 57 mL/min — AB (ref >60)
Glucose, Bld: 92 mg/dL (ref 65–99)
POTASSIUM: 4.5 mmol/L (ref 3.5–5.1)
SODIUM: 138 mmol/L (ref 135–145)

## 2016-07-30 LAB — TYPE AND SCREEN
ABO/RH(D): B POS
ANTIBODY SCREEN: NEGATIVE
UNIT DIVISION: 0

## 2016-07-30 LAB — BPAM RBC
BLOOD PRODUCT EXPIRATION DATE: 201806122359
ISSUE DATE / TIME: 201806091117
Unit Type and Rh: 7300

## 2016-07-30 LAB — GLUCOSE, CAPILLARY: Glucose-Capillary: 92 mg/dL (ref 65–99)

## 2016-07-30 LAB — HEPARIN LEVEL (UNFRACTIONATED): HEPARIN UNFRACTIONATED: 0.39 [IU]/mL (ref 0.30–0.70)

## 2016-07-30 NOTE — Progress Notes (Signed)
ANTICOAGULATION CONSULT NOTE - Follow Up Consult  Pharmacy Consult for Heparin  Indication: pulmonary embolus  Assessment: 66 y/o M on heparin drip for acute PE diagnosed at OSH per notes. 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 Heparin level is therapeutic (0.39) on 1100 units/hr. Hgb remains low stable with no overt sign of bleeding. Patient was transfused 1 unit 6/9.   Of note, Scr stable at 1.29 with CrCl ~65 ml/min.   Goal of Therapy:  Heparin level 0.3-0.5 units/ml Monitor platelets by anticoagulation protocol: Yes   Plan:  1. Continue heparin infusion at 1100 units/hr 2. Monitor heparin level with am labs, daily CBC 3. Follow-up plan to transition to oral anticoagulation   Allergies  Allergen Reactions  . No Known Allergies     Patient Measurements: Height: 6\' 4"  (193 cm) Weight: 173 lb 1 oz (78.5 kg) IBW/kg (Calculated) : 86.8  Vital Signs: Temp: 99 F (37.2 C) (06/10 0743) Temp Source: Oral (06/10 0743) BP: 132/82 (06/10 0743) Pulse Rate: 95 (06/10 0500)  Labs:  Recent Labs  07/28/16 0157  07/28/16 1230 07/29/16 0338 07/29/16 1530 07/30/16 0149  HGB 7.8*  < >  --  7.7* 8.7* 8.9*  HCT 23.6*  < >  --  23.9* 26.3* 27.6*  PLT 149*  < >  --  205 189 212  HEPARINUNFRC 0.26*  --  0.36 0.37  --  0.39  CREATININE 1.25*  --   --  1.25*  --  1.29*  < > = values in this interval not displayed.  Estimated Creatinine Clearance: 65.1 mL/min (A) (by C-G formula based on SCr of 1.29 mg/dL (H)).  Belia Heman, PharmD PGY1 Pharmacy Resident [phone] (Pager) 07/30/2016 8:18 AM

## 2016-07-30 NOTE — Progress Notes (Addendum)
      [address]       [place] [address]             [phone]         Procedure(s) (LRB): VIDEO ASSISTED THORACOSCOPY (Right) BLEBECTOMY (Right)  Subjective: Patient without specific complaints. He hopes to not have surgery.  Objective: Vital signs in last 24 hours: Temp:  [98.2 F (36.8 C)-99.4 F (37.4 C)] 99 F (37.2 C) (06/10 0743) Pulse Rate:  [64-109] 95 (06/10 0500) Cardiac Rhythm: Normal sinus rhythm (06/10 0400) Resp:  [15-31] 31 (06/10 0500) BP: (121-145)/(74-93) 132/82 (06/10 0743) SpO2:  [89 %-100 %] 97 % (06/10 0500)     Intake/Output from previous day: 06/09 0701 - 06/10 0700 In: 588 [I.V.:253; Blood:335] Out: 1050 [Urine:1000; Chest Tube:50]   Physical Exam:  Cardiovascular: Tachycardic Pulmonary: Mostly clear to auscultation bilaterally Wounds: Hematoma. Dried blood on dressing-no active bleeding this am. Chest Tube: to water seal , no air leak  Lab Results: CBC:  Recent Labs  07/29/16 1530 07/30/16 0149  WBC 12.7* 13.3*  HGB 8.7* 8.9*  HCT 26.3* 27.6*  PLT 189 212   BMET:   Recent Labs  07/29/16 0338 07/30/16 0149  NA 137 138  K 4.2 4.5  CL 102 104  CO2 27 28  GLUCOSE 102* 92  BUN 9 8  CREATININE 1.25* 1.29*  CALCIUM 8.7* 8.6*    PT/INR: No results for input(s): LABPROT, INR in the last 72 hours. ABG:  INR: Will add last result for INR, ABG once components are confirmed Will add last 4 CBG results once components are confirmed  Assessment/Plan:  1. CV - Tachycardic. On Amlodipine 10 mg daily. 2.  Pulmonary - Chest tube with 50 cc of output last 24 hours. Chest tube is to water seal. There is no air leak. . CXR this am shows stable right pneumothorax (apical and basilar). Chest tube to remain to water seal for now. Dr. Roxan Hockey to evaluate in am and decide if OR needed or not. On Mucinex, Tussionex, and Benzonatate for cough. Check CXR in am. 3. PE-on Heparin drip, per pharmacy 4. Anemia-H and H  decerased to 7.7 and 23.9. On Ferrous sulfate 325 mg tid. 5. Management per medicine  ZIMMERMAN,DONIELLE MPA-C 07/30/2016,8:48 AM   Chart reviewed, patient examined, agree with above. There is no air leak from the tube with coughing but persistent basilar ptx that may be a little larger today than yesterday. There is also a small apical ptx. Will keep tube to water seal but he may need VATS to get lung fully expanded and resolve this issue. Dr. Roxan Hockey will decide. Will make NPO after MN.

## 2016-07-30 NOTE — Progress Notes (Signed)
[facility] Progress Note  Patient: Charles [name] LDJ:[id number]   PCP: Biagio Borg, MD DOB: [birth year]   DOA: 07/23/2016   DOS: 07/30/2016   Date of Service: the patient was seen and examined on 07/30/2016  Subjective: No acute complaint. No nausea no vomiting. No chest pain. No leg pain. Shortness of breath is about the same. Cough is also what the same. No fever no chills.  Brief hospital course: 66 year old African-American male with a past medical history of hypertension, hyperlipidemia, COPD, stroke, history of PE in 2016, but no longer on anticoagulation, presented with shortness of breath. He was evaluated at an outside facility. Evaluation revealed bilateral pulmonary embolism but also showed right-sided pneumothorax. A chest tube was placed, he was started on heparin and the patient was transferred to this hospital. Cardiology surgery currently following the chest tube. Pneumothorax still present. Currently further plan is to monitor for resolution of pneumothorax.  Assessment and Plan: Acute pulmonary embolism Along with Left lower extremity DVT -PE was detected on CT scan done at the outside facility.  This is patient's second episode of PE(Previous on 12/15).  -Patient was started on IV heparin -Echocardiogram report reviewed. No evidence for right heart strain.  -We'll transition to oral anticoagulation when chest tube is removed. Patient would like to go on Xarelto whenever possible.  Right-sided spontaneous pneumothorax with bleeding complication -Patient does have a history of COPD. Noted to have emphysema on previous CT scans. Could have had blebs, which could've resulted in pneumothorax.  Initially had chest catheter placed in ED in [place], which got dislodged. Patient was seen by cardiothoracic surgery.  Chest tube inserted on 07/25/2016. Cardiothoracic surgery currently managing chest tube, small apical pneumothorax. As compared to yesterday and no  significant change but is completed today before mild increase in the pneumothorax size. Cardiac thoracic surgery will decide tomorrow regarding seizure or not. Appreciate their assistance.   Anemia secondary to acute blood loss Now symptomatic with tachycardia and fatigue. S/P 1 PRBC, now stable. Continue to monitor every 24 hours. Transfuse as needed.  Acute on chronic kidney disease stage III. Baseline creatinine about 1.1-1.2. It was 1.6 at admission. Improved with gentle IV hydration.  History of COPD Appears to be stable. Continue nebulizer treatments.  History of essential hypertension. Continue to monitor blood pressures. Continue amlodipine. Hydralazine as needed. Lisinopril was held due to elevated creatinine. Continue to hold for at least 1 or 2 more days.   Cough. Hemoptysis initially now resolved. No evidence of active infection. Chest x-ray clear for any pneumonia. On Tussionex, Tessalon Perles scheduled as well as Mucinex.  Constipation. Patient has been on MiraLAX and Senokot and still has no BM. Has bowel sound present, passing gas, no nausea no vomiting. Advance diet to soft diet at present. Currently resolved with Metamucil and psychiatric. Continue MiraLAX.  Generalized weakness. Physical therapy consulted.  Sinus tachycardia. Likely associated with anemia or pain. S/P 1 PRBC.  Diet: Soft diet  Advance goals of care discussion: Full code  Family Communication: no family was present at bedside, at the time of interview.   Disposition:  Discharge to .  Consultants: Cardiothoracic surgery Procedures: Chest tube placement Echo  Antibiotics: Anti-infectives    None       Objective: Physical Exam: Vitals:   07/30/16 0500 07/30/16 0743 07/30/16 1216 07/30/16 1500  BP: 124/80 132/82 (!) 159/97 116/81  Pulse: 95     Resp: (!) 31     Temp: 99.4 F (37.4 C)  99 F (37.2 C) 98.3 F (36.8 C) 98.5 F (36.9 C)  TempSrc: Oral Oral Oral Oral    SpO2: 97%     Weight:      Height:        Intake/Output Summary (Last 24 hours) at 07/30/16 1555 Last data filed at 07/30/16 1500  Gross per 24 hour  Intake              264 ml  Output             1150 ml  Net             -886 ml   Filed Weights   07/27/16 0500 07/28/16 0337 07/29/16 0320  Weight: 77.2 kg (170 lb 3.1 oz) 78.4 kg (172 lb 13.5 oz) 78.5 kg (173 lb 1 oz)   General: Appear in mild distress, no Rash; Oral Mucosa moist. no Abnormal Mass Or lumps Cardiovascular: S1 and S2 Present, no Murmur, Respiratory: normal respiratory effort, Bilateral Air entry present and basal Crackles, no wheezes Abdomen: Bowel Sound present, Soft and no tenderness, no hernia Extremities: no Pedal edema, no calf tenderness Neurology: Alert, Awake and Oriented to Time, Place and Person. affect appropriate. Grossly no focal neuro deficit.   Data Reviewed: CBC:  Recent Labs Lab 07/28/16 0157 07/28/16 0721 07/29/16 0338 07/29/16 1530 07/30/16 0149  WBC 10.9* 11.2* 11.7* 12.7* 13.3*  HGB 7.8* 8.3* 7.7* 8.7* 8.9*  HCT 23.6* 25.0* 23.9* 26.3* 27.6*  MCV 96.7 96.5 97.2 94.9 96.2  PLT 149* 168 205 189 599   Basic Metabolic Panel:  Recent Labs Lab 07/25/16 1123 07/27/16 0829 07/28/16 0157 07/29/16 0338 07/30/16 0149  NA 137 135 134* 137 138  K 3.8 3.5 3.8 4.2 4.5  CL 106 103 102 102 104  CO2 25 26 26 27 28   GLUCOSE 98 138* 105* 102* 92  BUN 9 10 12 9 8   CREATININE 1.22 1.26* 1.25* 1.25* 1.29*  CALCIUM 8.3* 8.3* 8.3* 8.7* 8.6*  MG  --   --  2.1  --   --     Liver Function Tests:  Recent Labs Lab 07/28/16 0157  AST 22  ALT 16*  ALKPHOS 37*  BILITOT 0.8  PROT 5.4*  ALBUMIN 2.6*   No results for input(s): LIPASE, AMYLASE in the last 168 hours. No results for input(s): AMMONIA in the last 168 hours. Coagulation Profile:  Recent Labs Lab 07/23/16 2156  INR 1.25   Cardiac Enzymes:  Recent Labs Lab 07/23/16 2156 07/24/16 0326 07/24/16 0852  TROPONINI 0.04* 0.04*  0.04*   BNP (last 3 results) No results for input(s): PROBNP in the last 8760 hours. CBG:  Recent Labs Lab 07/26/16 0739 07/27/16 0738 07/28/16 0740 07/29/16 0741 07/30/16 0740  GLUCAP 89 111* 96 86 92   Studies: Dg Chest Port 1 View  Result Date: 07/30/2016 CLINICAL DATA:  Pneumothorax EXAM: PORTABLE CHEST 1 VIEW COMPARISON:  July 29, 2016 and July 28, 2016 FINDINGS: Chest tube remains on the right. The pneumothorax noted previously on the right appears stable with primarily lateral and basilar components. There is no appreciable tension component. Left lung is clear. Areas of atelectasis or present on the right. Heart size and pulmonary vascularity are normal. No adenopathy. There is degenerative change in the thoracic spine. IMPRESSION: The known right-sided pneumothorax is again present. There has been a definite increase in size of this pneumothorax compared to 2 days prior. Compared to 1 day prior, there has been very little overall  change. Note that this pneumothorax is primarily present in the lateral and basilar components on the right, but there is a stable apical component as well. Chest tube tip is in the right apex, stable. Left lung clear. Stable cardiac silhouette. These results will be called to the ordering clinician or representative by the Radiologist Assistant, and communication documented in the PACS or zVision Dashboard. Electronically Signed   By: Lowella Grip III M.D.   On: 07/30/2016 07:40    Scheduled Meds: . amLODipine  10 mg Oral Daily  . benzonatate  100 mg Oral TID  . bisacodyl  10 mg Rectal Once  . chlorpheniramine-HYDROcodone  5 mL Oral Q12H  . ezetimibe  10 mg Oral Daily  . feeding supplement (ENSURE ENLIVE)  237 mL Oral BID BM  . ferrous sulfate  325 mg Oral TID WC  . guaiFENesin  600 mg Oral BID  . mouth rinse  15 mL Mouth Rinse BID  . polyethylene glycol  17 g Oral Daily  . rosuvastatin  40 mg Oral q1800  . senna-docusate  1 tablet Oral BID  .  sodium chloride flush  3 mL Intravenous Q12H   Continuous Infusions: . heparin 1,100 Units/hr (07/30/16 1500)   PRN Meds: acetaminophen **OR** acetaminophen, albuterol, dextromethorphan-guaiFENesin, hydrALAZINE, ipratropium, morphine injection, ondansetron **OR** ondansetron (ZOFRAN) IV, oxyCODONE-acetaminophen, zolpidem  Time spent: 30 minutes  Author: Berle Mull, MD [facility] Pager: [phone] 07/30/2016 3:55 PM  If 7PM-7AM, please contact night-coverage at [email or web address], password [facility]

## 2016-07-30 NOTE — Evaluation (Signed)
Physical Therapy Evaluation Patient Details Name: Charles [name] MRN: [id number] DOB: 01/03/1951 Today's Date: 07/30/2016   History of Present Illness  Patient is a 66 yo male admitted 07/23/16 with SOB.  Patient with bilateral PE and Rt pneumothorax.  Chest tube places.  Monitoring resolution of ptx.  If not, may have VATS on Monday.   PMH:  hypertension, hyperlipidemia, COPD, stroke, history of PE in 2016, PVD, CAD, CKD  Clinical Impression  Patient presents with problems listed below.  Will benefit from acute PT to maximize functional mobility prior to return home with wife.  Patient with general weakness impacting mobility and gait.  Recommend f/u HHPT at d/c for continued therapy.    Follow Up Recommendations Home health PT;Supervision for mobility/OOB    Equipment Recommendations  Standard walker;3in1 (PT) (Will continue to assess need)    Recommendations for Other Services OT consult     Precautions / Restrictions Precautions Precautions: Fall Precaution Comments: Reports lightheadedness with sitting and standing initially.  Monitor HR Restrictions Weight Bearing Restrictions: No      Mobility  Bed Mobility Overal bed mobility: Needs Assistance Bed Mobility: Supine to Sit;Sit to Supine     Supine to sit: Min guard Sit to supine: Min guard   General bed mobility comments: Assist for safety.  Patient reports feeling "woozy" initially with sitting.  Improved with time.  Transfers Overall transfer level: Needs assistance Equipment used: None Transfers: Sit to/from Stand Sit to Stand: Min guard         General transfer comment: Min guard assist for safety.  Again patient feeling lightheaded - improved with time.  Patient shaky in stance with LE weakness.  Patient having difficulty attempting very shallow squats.  O2 sats remained in 90's on O2.  HR started to increase from 100 to 133, with increased RR to 30.  Had patient return to sitting > supine.  HR returned to 101  in 90 seconds.  Ambulation/Gait             General Gait Details: NT  Stairs            Wheelchair Mobility    Modified Rankin (Stroke Patients Only)       Balance Overall balance assessment: Needs assistance Sitting-balance support: No upper extremity supported;Feet supported Sitting balance-Leahy Scale: Good     Standing balance support: No upper extremity supported Standing balance-Leahy Scale: Fair                               Pertinent Vitals/Pain Pain Assessment: No/denies pain    Home Living Family/patient expects to be discharged to:: Private residence Living Arrangements: Spouse/significant other Available Help at Discharge: Family;Available 24 hours/day Type of Home: House Home Access: Stairs to enter   [employer] of Steps: 2 Home Layout: Two level;Able to live on main level with bedroom/bathroom Home Equipment: None      Prior Function Level of Independence: Independent         Comments: Drives     Hand Dominance        Extremity/Trunk Assessment   Upper Extremity Assessment Upper Extremity Assessment: Overall WFL for tasks assessed (Did not raise RUE above 90* due to chest tube)    Lower Extremity Assessment Lower Extremity Assessment: Generalized weakness    Cervical / Trunk Assessment Cervical / Trunk Assessment: Normal  Communication   Communication: No difficulties  Cognition Arousal/Alertness: Awake/alert Behavior During Therapy: [facility]  for tasks assessed/performed Overall Cognitive Status: Within Functional Limits for tasks assessed                                        General Comments      Exercises     Assessment/Plan    PT Assessment Patient needs continued PT services  PT Problem List Decreased strength;Decreased activity tolerance;Decreased balance;Decreased mobility;Decreased knowledge of use of DME;Cardiopulmonary status limiting activity       PT Treatment  Interventions DME instruction;Gait training;Stair training;Functional mobility training;Therapeutic activities;Therapeutic exercise;Balance training;Patient/family education    PT Goals (Current goals can be found in the Care Plan section)  Acute Rehab PT Goals Patient Stated Goal: To get stronger PT Goal Formulation: With patient Time For Goal Achievement: 08/06/16 Potential to Achieve Goals: Good    Frequency Min 3X/week   Barriers to discharge        Co-evaluation               AM-PAC PT "6 Clicks" Daily Activity  Outcome Measure Difficulty turning over in bed (including adjusting bedclothes, sheets and blankets)?: None Difficulty moving from lying on back to sitting on the side of the bed? : None Difficulty sitting down on and standing up from a chair with arms (e.g., wheelchair, bedside commode, etc,.)?: None Help needed moving to and from a bed to chair (including a wheelchair)?: A Little Help needed walking in hospital room?: A Little Help needed climbing 3-5 steps with a railing? : A Lot 6 Click Score: 20    End of Session Equipment Utilized During Treatment: Gait belt;Oxygen Activity Tolerance: Patient limited by fatigue;Treatment limited secondary to medical complications (Comment) (Tachycardia) Patient left: in bed;with call bell/phone within reach;with family/visitor present   PT Visit Diagnosis: Unsteadiness on feet (R26.81);Difficulty in walking, not elsewhere classified (R26.2);Muscle weakness (generalized) (M62.81)    Time: 9675-9163 PT Time Calculation (min) (ACUTE ONLY): 15 min   Charges:   PT Evaluation $PT Eval High Complexity: 1 Procedure     PT G Codes:        Carita Pian. Sanjuana Kava, [facility] Acute Rehab Services Pager [place] 07/30/2016, 10:52 AM

## 2016-07-31 ENCOUNTER — Inpatient Hospital Stay (HOSPITAL_COMMUNITY): Payer: Managed Care, Other (non HMO) | Admitting: Anesthesiology

## 2016-07-31 ENCOUNTER — Inpatient Hospital Stay (HOSPITAL_COMMUNITY): Payer: Managed Care, Other (non HMO)

## 2016-07-31 ENCOUNTER — Encounter (HOSPITAL_COMMUNITY)
Admission: AD | Disposition: A | Discharge status: Home / Self Care | Payer: Self-pay | Source: Other Acute Inpatient Hospital | Attending: Thoracic Surgery (Cardiothoracic Vascular Surgery)

## 2016-07-31 ENCOUNTER — Encounter (HOSPITAL_COMMUNITY): Payer: Self-pay | Admitting: Anesthesiology

## 2016-07-31 DIAGNOSIS — J9311 Primary spontaneous pneumothorax: Secondary | ICD-10-CM

## 2016-07-31 DIAGNOSIS — J439 Emphysema, unspecified: Secondary | ICD-10-CM | POA: Diagnosis present

## 2016-07-31 HISTORY — PX: STAPLING OF BLEBS: SHX6429

## 2016-07-31 HISTORY — PX: VIDEO ASSISTED THORACOSCOPY: SHX5073

## 2016-07-31 LAB — POCT I-STAT 7, (LYTES, BLD GAS, ICA,H+H)
ACID-BASE DEFICIT: 1 mmol/L (ref 0.0–2.0)
ACID-BASE DEFICIT: 6 mmol/L — AB (ref 0.0–2.0)
Acid-base deficit: 5 mmol/L — ABNORMAL HIGH (ref 0.0–2.0)
BICARBONATE: 19.8 mmol/L — AB (ref 20.0–28.0)
Bicarbonate: 25 mmol/L (ref 20.0–28.0)
Bicarbonate: 21.3 mmol/L (ref 20.0–28.0)
CALCIUM ION: 1.07 mmol/L — AB (ref 1.15–1.40)
Calcium, Ion: 1.19 mmol/L (ref 1.15–1.40)
Calcium, Ion: 1.03 mmol/L — ABNORMAL LOW (ref 1.15–1.40)
HCT: 22 % — ABNORMAL LOW (ref 39.0–52.0)
HEMATOCRIT: 19 % — AB (ref 39.0–52.0)
HEMATOCRIT: 25 % — AB (ref 39.0–52.0)
HEMOGLOBIN: 6.5 g/dL — AB (ref 13.0–17.0)
HEMOGLOBIN: 8.5 g/dL — AB (ref 13.0–17.0)
Hemoglobin: 7.5 g/dL — ABNORMAL LOW (ref 13.0–17.0)
O2 SAT: 89 %
O2 Saturation: 86 %
O2 Saturation: 95 %
PCO2 ART: 38.5 mmHg (ref 32.0–48.0)
PH ART: 7.326 — AB (ref 7.350–7.450)
PO2 ART: 55 mmHg — AB (ref 83.0–108.0)
PO2 ART: 50 mmHg — AB (ref 83.0–108.0)
POTASSIUM: 4.7 mmol/L (ref 3.5–5.1)
Patient temperature: 35.2
Potassium: 4.4 mmol/L (ref 3.5–5.1)
Potassium: 4.1 mmol/L (ref 3.5–5.1)
SODIUM: 138 mmol/L (ref 135–145)
Sodium: 136 mmol/L (ref 135–145)
Sodium: 140 mmol/L (ref 135–145)
TCO2: 27 mmol/L (ref 0–100)
TCO2: 21 mmol/L (ref 0–100)
TCO2: 23 mmol/L (ref 0–100)
pCO2 arterial: 46.9 mmHg (ref 32.0–48.0)
pCO2 arterial: 39.4 mmHg (ref 32.0–48.0)
pH, Arterial: 7.308 — ABNORMAL LOW (ref 7.350–7.450)
pH, Arterial: 7.328 — ABNORMAL LOW (ref 7.350–7.450)
pO2, Arterial: 72 mmHg — ABNORMAL LOW (ref 83.0–108.0)

## 2016-07-31 LAB — CBC
HCT: 26.7 % — ABNORMAL LOW (ref 39.0–52.0)
HCT: 26 % — ABNORMAL LOW (ref 39.0–52.0)
HEMOGLOBIN: 8.6 g/dL — AB (ref 13.0–17.0)
Hemoglobin: 8.4 g/dL — ABNORMAL LOW (ref 13.0–17.0)
MCH: 31.2 pg (ref 26.0–34.0)
MCH: 31.3 pg (ref 26.0–34.0)
MCHC: 32.2 g/dL (ref 30.0–36.0)
MCHC: 32.3 g/dL (ref 30.0–36.0)
MCV: 96.7 fL (ref 78.0–100.0)
MCV: 97 fL (ref 78.0–100.0)
PLATELETS: 224 10*3/uL (ref 150–400)
Platelets: 197 10*3/uL (ref 150–400)
RBC: 2.76 MIL/uL — AB (ref 4.22–5.81)
RBC: 2.68 MIL/uL — ABNORMAL LOW (ref 4.22–5.81)
RDW: 16.2 % — ABNORMAL HIGH (ref 11.5–15.5)
RDW: 16.2 % — ABNORMAL HIGH (ref 11.5–15.5)
WBC: 14.5 10*3/uL — AB (ref 4.0–10.5)
WBC: 12.9 10*3/uL — ABNORMAL HIGH (ref 4.0–10.5)

## 2016-07-31 LAB — BASIC METABOLIC PANEL
Anion gap: 7 (ref 5–15)
BUN: 11 mg/dL (ref 6–20)
CO2: 26 mmol/L (ref 22–32)
Calcium: 8.6 mg/dL — ABNORMAL LOW (ref 8.9–10.3)
Chloride: 104 mmol/L (ref 101–111)
Creatinine, Ser: 1.29 mg/dL — ABNORMAL HIGH (ref 0.61–1.24)
GFR calc Af Amer: >60 mL/min (ref >60)
GFR, EST NON AFRICAN AMERICAN: 57 mL/min — AB (ref >60)
Glucose, Bld: 110 mg/dL — ABNORMAL HIGH (ref 65–99)
Potassium: 4.4 mmol/L (ref 3.5–5.1)
Sodium: 137 mmol/L (ref 135–145)

## 2016-07-31 LAB — PROTIME-INR
INR: 0.98
Prothrombin Time: 13 seconds (ref 11.4–15.2)

## 2016-07-31 LAB — URINALYSIS, ROUTINE W REFLEX MICROSCOPIC
BILIRUBIN URINE: NEGATIVE
Glucose, UA: NEGATIVE mg/dL
HGB URINE DIPSTICK: NEGATIVE
KETONES UR: NEGATIVE mg/dL
Leukocytes, UA: NEGATIVE
NITRITE: NEGATIVE
PH: 7 (ref 5.0–8.0)
Protein, ur: NEGATIVE mg/dL
Specific Gravity, Urine: 1.012 (ref 1.005–1.030)

## 2016-07-31 LAB — COMPREHENSIVE METABOLIC PANEL
ALBUMIN: 2.9 g/dL — AB (ref 3.5–5.0)
ALK PHOS: 38 U/L (ref 38–126)
ALT: 15 U/L — AB (ref 17–63)
AST: 23 U/L (ref 15–41)
Anion gap: 8 (ref 5–15)
BUN: 10 mg/dL (ref 6–20)
CALCIUM: 8.6 mg/dL — AB (ref 8.9–10.3)
CO2: 23 mmol/L (ref 22–32)
CREATININE: 1.29 mg/dL — AB (ref 0.61–1.24)
Chloride: 105 mmol/L (ref 101–111)
GFR calc Af Amer: >60 mL/min (ref >60)
GFR calc non Af Amer: 57 mL/min — ABNORMAL LOW (ref >60)
GLUCOSE: 103 mg/dL — AB (ref 65–99)
Potassium: 4.5 mmol/L (ref 3.5–5.1)
SODIUM: 136 mmol/L (ref 135–145)
Total Bilirubin: 1.1 mg/dL (ref 0.3–1.2)
Total Protein: 6.3 g/dL — ABNORMAL LOW (ref 6.5–8.1)

## 2016-07-31 LAB — BLOOD GAS, ARTERIAL
Acid-Base Excess: 0.5 mmol/L (ref 0.0–2.0)
Bicarbonate: 24 mmol/L (ref 20.0–28.0)
Drawn by: 362771
O2 Content: 2 L/min
O2 SAT: 90.9 %
PCO2 ART: 34.7 mmHg (ref 32.0–48.0)
PO2 ART: 61.2 mmHg — AB (ref 83.0–108.0)
Patient temperature: 98.6
pH, Arterial: 7.454 — ABNORMAL HIGH (ref 7.350–7.450)

## 2016-07-31 LAB — PREPARE RBC (CROSSMATCH)

## 2016-07-31 LAB — HEPARIN LEVEL (UNFRACTIONATED): Heparin Unfractionated: 0.38 IU/mL (ref 0.30–0.70)

## 2016-07-31 SURGERY — VIDEO ASSISTED THORACOSCOPY
Anesthesia: General | Site: Chest | Laterality: Right

## 2016-07-31 MED ORDER — POTASSIUM CHLORIDE 10 MEQ/50ML IV SOLN: 10.0000 meq | Freq: Every day | INTRAVENOUS | Status: DC | PRN

## 2016-07-31 MED ORDER — SUGAMMADEX SODIUM 200 MG/2ML IV SOLN
INTRAVENOUS | Status: DC | PRN
  Administered 2016-07-31: 200 mg via INTRAVENOUS

## 2016-07-31 MED ORDER — ROCURONIUM BROMIDE 10 MG/ML (PF) SYRINGE
PREFILLED_SYRINGE | INTRAVENOUS | Status: AC
  Filled 2016-07-31: qty 5

## 2016-07-31 MED ORDER — ARTIFICIAL TEARS OPHTHALMIC OINT
TOPICAL_OINTMENT | OPHTHALMIC | Status: AC
  Filled 2016-07-31: qty 3.5

## 2016-07-31 MED ORDER — LIDOCAINE HCL 4 % MT SOLN
OROMUCOSAL | Status: DC | PRN
  Administered 2016-07-31: 4 mL via TOPICAL

## 2016-07-31 MED ORDER — FENTANYL CITRATE (PF) 250 MCG/5ML IJ SOLN
INTRAMUSCULAR | Status: AC
  Filled 2016-07-31: qty 5

## 2016-07-31 MED ORDER — MIDAZOLAM HCL 5 MG/5ML IJ SOLN
INTRAMUSCULAR | Status: DC | PRN
Start: 2016-07-31 — End: 2016-07-31
  Administered 2016-07-31 (×2): 1 mg via INTRAVENOUS

## 2016-07-31 MED ORDER — DIPHENHYDRAMINE HCL 50 MG/ML IJ SOLN
12.5000 mg | Freq: Four times a day (QID) | INTRAMUSCULAR | Status: DC | PRN
  Administered 2016-08-01: 12.5 mg via INTRAVENOUS
  Filled 2016-07-31: qty 1

## 2016-07-31 MED ORDER — ACETAMINOPHEN 500 MG PO TABS
1000.0000 mg | ORAL_TABLET | Freq: Four times a day (QID) | ORAL | Status: AC
  Administered 2016-07-31 – 2016-08-05 (×19): 1000 mg via ORAL
  Filled 2016-07-31 (×18): qty 2

## 2016-07-31 MED ORDER — ORAL CARE MOUTH RINSE
15.0000 mL | Freq: Two times a day (BID) | OROMUCOSAL | Status: DC
  Administered 2016-07-31 – 2016-08-08 (×12): 15 mL via OROMUCOSAL

## 2016-07-31 MED ORDER — ALBUTEROL SULFATE (2.5 MG/3ML) 0.083% IN NEBU
2.5000 mg | INHALATION_SOLUTION | RESPIRATORY_TRACT | Status: DC
  Administered 2016-07-31 (×2): 2.5 mg via RESPIRATORY_TRACT
  Filled 2016-07-31 (×2): qty 3

## 2016-07-31 MED ORDER — TRAMADOL HCL 50 MG PO TABS
50.0000 mg | ORAL_TABLET | Freq: Four times a day (QID) | ORAL | Status: DC | PRN
  Administered 2016-08-06: 100 mg via ORAL
  Filled 2016-07-31: qty 2

## 2016-07-31 MED ORDER — DEXAMETHASONE SODIUM PHOSPHATE 10 MG/ML IJ SOLN
INTRAMUSCULAR | Status: DC | PRN
  Administered 2016-07-31: 10 mg via INTRAVENOUS

## 2016-07-31 MED ORDER — NALOXONE HCL 0.4 MG/ML IJ SOLN: 0.4000 mg | INTRAMUSCULAR | Status: DC | PRN

## 2016-07-31 MED ORDER — FENTANYL CITRATE (PF) 100 MCG/2ML IJ SOLN
INTRAMUSCULAR | Status: DC | PRN
  Administered 2016-07-31 (×6): 50 ug via INTRAVENOUS
  Administered 2016-07-31 (×2): 100 ug via INTRAVENOUS

## 2016-07-31 MED ORDER — FENTANYL CITRATE (PF) 100 MCG/2ML IJ SOLN
INTRAMUSCULAR | Status: AC
  Filled 2016-07-31: qty 2

## 2016-07-31 MED ORDER — PROPOFOL 10 MG/ML IV BOLUS
INTRAVENOUS | Status: AC
  Filled 2016-07-31: qty 20

## 2016-07-31 MED ORDER — ONDANSETRON HCL 4 MG/2ML IJ SOLN
INTRAMUSCULAR | Status: DC | PRN
  Administered 2016-07-31: 4 mg via INTRAVENOUS

## 2016-07-31 MED ORDER — FENTANYL 40 MCG/ML IV SOLN
INTRAVENOUS | Status: DC
  Administered 2016-07-31: 195 ug via INTRAVENOUS
  Administered 2016-07-31: 1000 ug via INTRAVENOUS
  Administered 2016-07-31: 40 ug via INTRAVENOUS
  Administered 2016-08-01: 75 ug via INTRAVENOUS
  Administered 2016-08-01: 90 ug via INTRAVENOUS
  Administered 2016-08-01: 15 ug via INTRAVENOUS
  Administered 2016-08-01: 45 ug via INTRAVENOUS
  Administered 2016-08-01: 75 ug via INTRAVENOUS
  Administered 2016-08-01: 90 ug via INTRAVENOUS
  Administered 2016-08-02: 75 ug via INTRAVENOUS
  Administered 2016-08-02: 15 ug via INTRAVENOUS
  Administered 2016-08-02: 45 ug via INTRAVENOUS
  Administered 2016-08-02: 15 ug via INTRAVENOUS
  Administered 2016-08-02 (×2): 0 ug via INTRAVENOUS
  Administered 2016-08-03: 15 ug via INTRAVENOUS
  Filled 2016-07-31 (×2): qty 25

## 2016-07-31 MED ORDER — SUGAMMADEX SODIUM 200 MG/2ML IV SOLN
INTRAVENOUS | Status: AC
  Filled 2016-07-31: qty 2

## 2016-07-31 MED ORDER — DIPHENHYDRAMINE HCL 12.5 MG/5ML PO ELIX
12.5000 mg | ORAL_SOLUTION | Freq: Four times a day (QID) | ORAL | Status: DC | PRN
  Administered 2016-07-31 – 2016-08-02 (×2): 12.5 mg via ORAL
  Filled 2016-07-31 (×2): qty 5

## 2016-07-31 MED ORDER — SODIUM CHLORIDE 0.9 % IV SOLN
10.0000 mL/h | Freq: Once | INTRAVENOUS | Status: DC
  Administered 2016-07-31: 10 mL/h via INTRAVENOUS

## 2016-07-31 MED ORDER — ONDANSETRON HCL 4 MG/2ML IJ SOLN
INTRAMUSCULAR | Status: AC
  Filled 2016-07-31: qty 2

## 2016-07-31 MED ORDER — FENTANYL CITRATE (PF) 100 MCG/2ML IJ SOLN
25.0000 ug | INTRAMUSCULAR | Status: DC | PRN
  Administered 2016-07-31 (×2): 50 ug via INTRAVENOUS

## 2016-07-31 MED ORDER — LACTATED RINGERS IV SOLN
INTRAVENOUS | Status: DC | PRN
  Administered 2016-07-31 (×3): via INTRAVENOUS

## 2016-07-31 MED ORDER — DEXTROSE 5 % IV SOLN
INTRAVENOUS | Status: DC | PRN
  Administered 2016-07-31: 08:00:00 via INTRAVENOUS

## 2016-07-31 MED ORDER — SODIUM CHLORIDE 0.9 % IV SOLN
INTRAVENOUS | Status: DC
  Administered 2016-07-31: 15:00:00 via INTRAVENOUS

## 2016-07-31 MED ORDER — DEXAMETHASONE SODIUM PHOSPHATE 10 MG/ML IJ SOLN
INTRAMUSCULAR | Status: AC
  Filled 2016-07-31: qty 1

## 2016-07-31 MED ORDER — PHENYLEPHRINE HCL 10 MG/ML IJ SOLN
INTRAMUSCULAR | Status: DC | PRN
  Administered 2016-07-31: 45 ug/min via INTRAVENOUS

## 2016-07-31 MED ORDER — VANCOMYCIN HCL IN DEXTROSE 1-5 GM/200ML-% IV SOLN
1000.0000 mg | INTRAVENOUS | Status: AC
  Administered 2016-07-31: 1000 mg via INTRAVENOUS
  Filled 2016-07-31 (×2): qty 200

## 2016-07-31 MED ORDER — SODIUM CHLORIDE 0.9 % IV SOLN
INTRAVENOUS | Status: DC | PRN
  Administered 2016-07-31: 09:00:00 via INTRAVENOUS

## 2016-07-31 MED ORDER — SENNOSIDES-DOCUSATE SODIUM 8.6-50 MG PO TABS
1.0000 | ORAL_TABLET | Freq: Every day | ORAL | Status: DC
  Administered 2016-07-31 – 2016-08-06 (×7): 1 via ORAL
  Filled 2016-07-31 (×8): qty 1

## 2016-07-31 MED ORDER — OXYCODONE HCL 5 MG PO TABS
5.0000 mg | ORAL_TABLET | ORAL | Status: DC | PRN
  Administered 2016-08-01 (×2): 10 mg via ORAL
  Administered 2016-08-01 (×2): 5 mg via ORAL
  Administered 2016-08-01 – 2016-08-08 (×32): 10 mg via ORAL
  Filled 2016-07-31 (×31): qty 2
  Filled 2016-07-31: qty 1
  Filled 2016-07-31: qty 2
  Filled 2016-07-31: qty 1
  Filled 2016-07-31 (×2): qty 2

## 2016-07-31 MED ORDER — SODIUM CHLORIDE 0.9% FLUSH: 9.0000 mL | INTRAVENOUS | Status: DC | PRN

## 2016-07-31 MED ORDER — 0.9 % SODIUM CHLORIDE (POUR BTL) OPTIME
TOPICAL | Status: DC | PRN
  Administered 2016-07-31: 2000 mL

## 2016-07-31 MED ORDER — LIDOCAINE HCL (CARDIAC) 20 MG/ML IV SOLN
INTRAVENOUS | Status: DC | PRN
  Administered 2016-07-31: 50 mg via INTRAVENOUS
  Administered 2016-07-31: 100 mg via INTRAVENOUS

## 2016-07-31 MED ORDER — LIDOCAINE 2% (20 MG/ML) 5 ML SYRINGE
INTRAMUSCULAR | Status: AC
  Filled 2016-07-31: qty 10

## 2016-07-31 MED ORDER — FENTANYL 40 MCG/ML IV SOLN
INTRAVENOUS | Status: AC
  Filled 2016-07-31: qty 25

## 2016-07-31 MED ORDER — ROCURONIUM BROMIDE 100 MG/10ML IV SOLN
INTRAVENOUS | Status: DC | PRN
Start: 2016-07-31 — End: 2016-07-31
  Administered 2016-07-31: 50 mg via INTRAVENOUS
  Administered 2016-07-31: 20 mg via INTRAVENOUS
  Administered 2016-07-31: 30 mg via INTRAVENOUS

## 2016-07-31 MED ORDER — PROPOFOL 10 MG/ML IV BOLUS
INTRAVENOUS | Status: DC | PRN
  Administered 2016-07-31: 50 mg via INTRAVENOUS
  Administered 2016-07-31: 150 mg via INTRAVENOUS

## 2016-07-31 MED ORDER — MIDAZOLAM HCL 2 MG/2ML IJ SOLN
INTRAMUSCULAR | Status: AC
  Filled 2016-07-31: qty 2

## 2016-07-31 MED ORDER — ALBUMIN HUMAN 5 % IV SOLN
INTRAVENOUS | Status: DC | PRN
  Administered 2016-07-31: 09:00:00 via INTRAVENOUS

## 2016-07-31 MED ORDER — MORPHINE SULFATE (PF) 4 MG/ML IV SOLN
2.0000 mg | INTRAVENOUS | Status: DC | PRN
  Administered 2016-08-05 (×3): 2 mg via INTRAVENOUS
  Filled 2016-07-31 (×4): qty 1

## 2016-07-31 MED ORDER — ACETAMINOPHEN 160 MG/5ML PO SOLN: 1000.0000 mg | Freq: Four times a day (QID) | ORAL | Status: AC

## 2016-07-31 MED ORDER — ONDANSETRON HCL 4 MG/2ML IJ SOLN: 4.0000 mg | Freq: Four times a day (QID) | INTRAMUSCULAR | Status: DC | PRN

## 2016-07-31 MED ORDER — PANTOPRAZOLE SODIUM 40 MG PO TBEC
40.0000 mg | DELAYED_RELEASE_TABLET | Freq: Every day | ORAL | Status: DC
  Administered 2016-07-31 – 2016-08-08 (×9): 40 mg via ORAL
  Filled 2016-07-31 (×9): qty 1

## 2016-07-31 SURGICAL SUPPLY — 59 items
CANISTER SUCT 3000ML PPV (MISCELLANEOUS) ×3 IMPLANT
CATH THORACIC 28FR (CATHETERS) ×3 IMPLANT
CATH THORACIC 36FR RT ANG (CATHETERS) ×3 IMPLANT
CLIP TI MEDIUM 6 (CLIP) ×3 IMPLANT
CONN ST 1/4X3/8  BEN (MISCELLANEOUS) ×2
CONN ST 1/4X3/8 BEN (MISCELLANEOUS) ×1 IMPLANT
CONN Y 3/8X3/8X3/8  BEN (MISCELLANEOUS) ×2
CONN Y 3/8X3/8X3/8 BEN (MISCELLANEOUS) ×1 IMPLANT
CONT SPEC 4OZ CLIKSEAL STRL BL (MISCELLANEOUS) ×15 IMPLANT
COVER SURGICAL LIGHT HANDLE (MISCELLANEOUS) ×3 IMPLANT
DRAPE LAPAROSCOPIC ABDOMINAL (DRAPES) ×3 IMPLANT
DRAPE WARM FLUID 44X44 (DRAPE) ×3 IMPLANT
ELECT BLADE 6.5 EXT (BLADE) ×3 IMPLANT
ELECT REM PT RETURN 9FT ADLT (ELECTROSURGICAL) ×3
ELECTRODE REM PT RTRN 9FT ADLT (ELECTROSURGICAL) ×1 IMPLANT
GAUZE SPONGE 4X4 12PLY STRL (GAUZE/BANDAGES/DRESSINGS) ×3 IMPLANT
GAUZE SPONGE 4X4 12PLY STRL LF (GAUZE/BANDAGES/DRESSINGS) ×3 IMPLANT
GLOVE BIO SURGEON STRL SZ7.5 (GLOVE) ×3 IMPLANT
GLOVE BIOGEL PI IND STRL 6.5 (GLOVE) ×1 IMPLANT
GLOVE BIOGEL PI IND STRL 7.0 (GLOVE) ×3 IMPLANT
GLOVE BIOGEL PI INDICATOR 6.5 (GLOVE) ×2
GLOVE BIOGEL PI INDICATOR 7.0 (GLOVE) ×6
GLOVE SURG SIGNA 7.5 PF LTX (GLOVE) ×6 IMPLANT
GLOVE SURG SS PI 6.5 STRL IVOR (GLOVE) ×3 IMPLANT
GOWN STRL REUS W/ TWL LRG LVL3 (GOWN DISPOSABLE) ×2 IMPLANT
GOWN STRL REUS W/ TWL XL LVL3 (GOWN DISPOSABLE) ×1 IMPLANT
GOWN STRL REUS W/TWL LRG LVL3 (GOWN DISPOSABLE) ×4
GOWN STRL REUS W/TWL XL LVL3 (GOWN DISPOSABLE) ×2
KIT BASIN OR (CUSTOM PROCEDURE TRAY) ×3 IMPLANT
KIT ROOM TURNOVER OR (KITS) ×3 IMPLANT
KIT SUCTION CATH 14FR (SUCTIONS) ×3 IMPLANT
NS IRRIG 1000ML POUR BTL (IV SOLUTION) ×6 IMPLANT
PACK CHEST (CUSTOM PROCEDURE TRAY) ×3 IMPLANT
PAD ARMBOARD 7.5X6 YLW CONV (MISCELLANEOUS) ×6 IMPLANT
RELOAD STAPLER GOLD 60MM (STAPLE) ×11 IMPLANT
SOLUTION ANTI FOG 6CC (MISCELLANEOUS) ×3 IMPLANT
SPECIMEN JAR MEDIUM (MISCELLANEOUS) ×3 IMPLANT
SPONGE INTESTINAL PEANUT (DISPOSABLE) ×24 IMPLANT
SPONGE TONSIL 1 RF SGL (DISPOSABLE) ×3 IMPLANT
STAPLE ECHEON FLEX 60 POW ENDO (STAPLE) ×3 IMPLANT
STAPLER RELOAD GOLD 60MM (STAPLE) ×33
STAPLER VISISTAT 35W (STAPLE) ×3 IMPLANT
SUT PROLENE 4 0 RB 1 (SUTURE)
SUT PROLENE 4-0 RB1 .5 CRCL 36 (SUTURE) IMPLANT
SUT SILK  1 MH (SUTURE) ×4
SUT SILK 1 MH (SUTURE) ×2 IMPLANT
SUT VIC AB 1 CTX 36 (SUTURE) ×3
SUT VIC AB 1 CTX36XBRD ANBCTR (SUTURE) ×1 IMPLANT
SUT VIC AB 2-0 CTX 36 (SUTURE) ×3 IMPLANT
SUT VIC AB 2-0 UR6 27 (SUTURE) ×3 IMPLANT
SUT VIC AB 3-0 X1 27 (SUTURE) ×3 IMPLANT
SYSTEM SAHARA CHEST DRAIN ATS (WOUND CARE) ×3 IMPLANT
TAPE CLOTH 4X10 WHT NS (GAUZE/BANDAGES/DRESSINGS) ×3 IMPLANT
TAPE CLOTH SURG 6X10 WHT LF (GAUZE/BANDAGES/DRESSINGS) ×3 IMPLANT
TOWEL OR 17X24 6PK STRL BLUE (TOWEL DISPOSABLE) ×3 IMPLANT
TOWEL OR 17X26 10 PK STRL BLUE (TOWEL DISPOSABLE) ×6 IMPLANT
TRAY FOLEY W/METER SILVER 16FR (SET/KITS/TRAYS/PACK) ×3 IMPLANT
TROCAR XCEL BLADELESS 5X75MML (TROCAR) ×3 IMPLANT
WATER STERILE IRR 1000ML POUR (IV SOLUTION) ×6 IMPLANT

## 2016-07-31 NOTE — Brief Op Note (Signed)
07/23/2016 - 07/31/2016  10:39 AM  PATIENT:  Charles [name]  66 y.o. male  PRE-OPERATIVE DIAGNOSIS:  Pneumothorax  POST-OPERATIVE DIAGNOSIS:  Pneumothorax  PROCEDURE:  Procedure(s): VIDEO ASSISTED THORACOSCOPY (Right) BLEBECTOMY (Right)  SURGEON:  Surgeon(s) and Role:    * Melrose Nakayama, MD - Primary  PHYSICIAN ASSISTANT: Jadene Pierini PA-C  ANESTHESIA:   general  EBL:  Total I/O In: 2182 [I.V.:2200; Blood:1005; IV Piggyback:250] Out: 1000 [Urine:600; Blood:400]  BLOOD ADMINISTERED:none  DRAINS: 2 Chest Tube(s) in the right hemithorax   LOCAL MEDICATIONS USED:  NONE  SPECIMEN:  Source of Specimen:  RUL and RML bleb resections  DISPOSITION OF SPECIMEN:  PATHOLOGY  COUNTS:  YES  TOURNIQUET:  * No tourniquets in log *  DICTATION: .Other Dictation: Dictation Number pending  PLAN OF CARE: Admit to inpatient   PATIENT DISPOSITION:  PACU - hemodynamically stable.   Delay start of Pharmacological VTE agent (>24hrs) due to surgical blood loss or risk of bleeding: yes

## 2016-07-31 NOTE — Anesthesia Postprocedure Evaluation (Signed)
Anesthesia Post Note  Patient: Charles [name]  Procedure(s) Performed: Procedure(s) (LRB): VIDEO ASSISTED THORACOSCOPY (Right) BLEBECTOMY (Right)     Patient location during evaluation: PACU Anesthesia Type: General Level of consciousness: awake and alert Pain management: pain level controlled Vital Signs Assessment: post-procedure vital signs reviewed and stable Respiratory status: spontaneous breathing, nonlabored ventilation, respiratory function stable and patient connected to nasal cannula oxygen Cardiovascular status: blood pressure returned to baseline and stable Postop Assessment: no signs of nausea or vomiting Anesthetic complications: no    Last Vitals:  Vitals:   07/31/16 1245 07/31/16 1300  BP: 125/70 110/76  Pulse: 94 95  Resp: (!) 24 (!) 24  Temp:      Last Pain:  Vitals:   07/31/16 1230  TempSrc: Oral  PainSc: 3                  Heydy Montilla,W. EDMOND

## 2016-07-31 NOTE — Op Note (Signed)
Charles [name], Charles [name] NO.:  [id number]  MEDICAL RECORD NO.:  [id number]  LOCATION:                                 FACILITY:  PHYSICIAN:  Revonda Standard. Roxan Hockey, M.D. DATE OF BIRTH:  DATE OF PROCEDURE:  07/31/2016 DATE OF DISCHARGE:                              OPERATIVE REPORT   PREOPERATIVE DIAGNOSIS:  Right spontaneous pneumothorax.  POSTOPERATIVE DIAGNOSIS:  Right spontaneous pneumothorax.  PROCEDURE:  Right video-assisted thoracoscopy, takedown of adhesions, apical blebectomy.  SURGEON:  Revonda Standard. Roxan Hockey, M.D.  ASSISTANT:  Jadene Pierini, P.A.-C.  ANESTHESIA:  General.  FINDINGS:  Adhesions of upper lobe and middle lobe to the chest wall, severe blebs at the apex, previous chest tube occluded with clotted blood.  CLINICAL NOTE:  Charles [name] is a 66 year old gentleman who was admitted with a spontaneous pneumothorax and bilateral pulmonary emboli.  He had a pleural catheter placed in the emergency room, which later became dislodged. He developed a recurrent pneumothorax, and a chest tube was placed anteriorly.  This worked initially, but then despite not having an air leak, he developed a worsening pneumothorax.  He was advised to undergo surgical exploration and possible blebectomy.  The indications, risks, benefits, and alternatives were discussed in detail with the patient. He understood and accepted the risks and agreed to proceed.  OPERATIVE NOTE:  Charles [name] was brought to the preoperative holding area on July 31, 2016.  Dr. Finis Bud of Anesthesia placed a central line and an arterial blood pressure monitoring line.  He was taken to the operating room, anesthetized, and intubated.  A Foley catheter was placed.  Intravenous antibiotics were administered.  Sequential compression devices were placed on the calves for DVT prophylaxis.  He was placed in the left lateral decubitus position.  The right chest tube was removed, it was noted to  be filled with clot.  The right chest was prepped and draped in usual sterile fashion.  Single lung ventilation of the left lung was initiated and was tolerated well throughout the procedure.  There was extensive bruising on the right chest from his previous pleural catheter placement.  An incision was made in the seventh interspace in the midaxillary line.  A 5-mm port was inserted into the chest.  The thoracoscope was advanced into the chest.  The lower lobe was completely collapsed.  The middle lobe was adhesed to the lateral and anterior chest wall.  There were significant adhesions of the upper lobe to the chest wall as well.  A working incision approximately 7 cm in length was made in the fifth interspace anterolaterally.  No rib spreading was performed during the procedure.  To assist with taking down adhesions, a second port incision was made posteriorly below the tip of the scapula.  The middle lobe was taken off the chest wall. These adhesions were dense and this was a slow and tedious process.  Once taken down, there was a tear noted in the middle lobe.  This was resected with a stapler later during the procedure.  In the upper lobe, there were significant adhesions to the chest wall, almost circumferentially of the lower portion of the upper lobe, with  scattered adhesions elsewhere.  These were taken down with electrocautery.  As the apex was approached, multiple large blebs were noted.  After freeing up the entire upper lobe, it was noted there was a long line of sequential blebs along the apex of the lung, a bleb resection was performed with sequential firings of an Echelon 60-mm stapler using gold cartridges.  The specimen was removed and sent for permanent pathology.  Inspection of staple line revealed 1 small residual bleb adjacent to it and that was resected as well.  More anteriorly on the right upper lobe, there was a small area of a very tiny bleb which was  resected, and finally, the tear in the middle lobe was corrected by resecting it with the endoscopic stapler.  There was extensive bleeding during the procedure, the clot was irrigated and evacuated on multiple occasions.  There was a raw surface where the lung had been taken down off the adhesions anteriorly and more superiorly. Posterior-lateral at the base, there were no adhesions.  This area was lightly abraded with a Bovie cautery pad. An additional port incision was made anterior to the scope site.  A 36-French chest tube was placed through the original port site and directed posteriorly.  A 28-French straight chest tube was placed through the anterior port site and directed apically.  Both were secured to skin with 0 silk sutures.  The posterior port incision was closed with #1 Vicryl fascial suture and skin staples.  The working incision was closed with #1 Vicryl fascial suture followed by 2-0 Vicryl subcutaneous suture.  The skin was closed with staples.  All sponge, needle, and instrument counts were correct at the end of the procedure.  The patient was extubated in the operating room and taken to the postanesthetic care unit in good condition.     Revonda Standard Roxan Hockey, M.D.     SCH/MEDQ  D:  07/31/2016  T:  07/31/2016  Job:  [id number]

## 2016-07-31 NOTE — Transfer of Care (Signed)
Immediate Anesthesia Transfer of Care Note  Patient: Charles [name]  Procedure(s) Performed: Procedure(s): VIDEO ASSISTED THORACOSCOPY (Right) BLEBECTOMY (Right)  Patient Location: PACU  Anesthesia Type:General  Level of Consciousness: awake, oriented, sedated, patient cooperative and responds to stimulation  Airway & Oxygen Therapy: Patient Spontanous Breathing and Patient connected to face mask oxygen  Post-op Assessment: Report given to RN, Post -op Vital signs reviewed and stable, Patient moving all extremities and Patient moving all extremities X 4  Post vital signs: Reviewed and stable  Last Vitals:  Vitals:   07/31/16 0200 07/31/16 0400  BP:  (!) 145/87  Pulse: (!) 110 96  Resp: 20 (!) 21  Temp:  36.9 C    Last Pain:  Vitals:   07/31/16 0400  TempSrc: Oral  PainSc:       Patients Stated Pain Goal: 3 (08/67/61 9509)  Complications: No apparent anesthesia complications

## 2016-07-31 NOTE — Interval H&P Note (Signed)
History and Physical Interval Note:  07/31/2016 7:19 AM  Charles [name]  has presented today for surgery, with the diagnosis of PTX  The various methods of treatment have been discussed with the patient and family. After consideration of risks, benefits and other options for treatment, the patient has consented to  Procedure(s): VIDEO ASSISTED THORACOSCOPY (Right) BLEBECTOMY (Right) as a surgical intervention .  The patient's history has been reviewed, patient examined, no change in status, stable for surgery.  I have reviewed the patient's chart and labs.  Questions were answered to the patient's satisfaction.     Melrose Nakayama

## 2016-07-31 NOTE — Progress Notes (Signed)
      [address]       [place] [address]             [phone]      Some incisional/ tube pain, is using PCA  BP 128/81   Pulse 83   Temp 98.5 F (36.9 C) (Oral)   Resp 14   Ht 6\' 4"  (1.93 m)   Wt 171 lb 11.8 oz (77.9 kg)   SpO2 98%   BMI 20.90 kg/m    Intake/Output Summary (Last 24 hours) at 07/31/16 1729 Last data filed at 07/31/16 1600  Gross per 24 hour  Intake             4831 ml  Output             2640 ml  Net             2191 ml   There is an air leak, moderate output from CT  Will reassess in AM if safe to restart heparin drip  Remo Lipps C. Roxan Hockey, MD [facility] [phone]

## 2016-07-31 NOTE — Anesthesia Procedure Notes (Signed)
Anesthesia Procedure Note RIJ CVP Dual Lumen: 9407-6808: The patient was identified and consent obtained.  TO was performed, and full barrier precautions were used.  The skin was anesthetized with lidocaine.  Once the vein was located with the 22 ga. needle using ultrasound guidance , the wire was inserted into the vein.  The wire location was confirmed with ultrasound.  The tissue was dilated and the catheter was carefully inserted, then sutured in place. A dressing was applied. The patient tolerated the procedure well.

## 2016-07-31 NOTE — Progress Notes (Signed)
Patient kept NPO after midnight last night- all pre-op orders executed - heparin dip off as per order - wife at bedside actively participated in care planning teaching.

## 2016-07-31 NOTE — Anesthesia Preprocedure Evaluation (Addendum)
Anesthesia Evaluation  Patient identified by MRN, date of birth, ID band Patient awake    Reviewed: Allergy & Precautions, NPO status , Patient's Chart, lab work & pertinent test results  Airway Mallampati: II  TM Distance: >3 FB     Dental   Pulmonary pneumonia, COPD, former smoker,     + decreased breath sounds      Cardiovascular hypertension, + CAD and + Peripheral Vascular Disease   Rhythm:Regular Rate:Normal     Neuro/Psych  Headaches,    GI/Hepatic Neg liver ROS, GERD  ,  Endo/Other    Renal/GU Renal disease     Musculoskeletal   Abdominal   Peds  Hematology   Anesthesia Other Findings   Reproductive/Obstetrics                            Anesthesia Physical Anesthesia Plan  ASA: III  Anesthesia Plan: General   Post-op Pain Management:    Induction: Intravenous  PONV Risk Score and Plan: 2 and Ondansetron and Dexamethasone  Airway Management Planned: Oral ETT  Additional Equipment: CVP and Arterial line  Intra-op Plan:   Post-operative Plan:   Informed Consent: I have reviewed the patients History and Physical, chart, labs and discussed the procedure including the risks, benefits and alternatives for the proposed anesthesia with the patient or authorized representative who has indicated his/her understanding and acceptance.   Dental advisory given  Plan Discussed with: CRNA and Anesthesiologist  Anesthesia Plan Comments:        Anesthesia Quick Evaluation

## 2016-07-31 NOTE — Progress Notes (Signed)
Physical Therapy Cancellation Note   07/31/16 1125  PT Visit Information  Last PT Received On 07/31/16  Reason Eval/Treat Not Completed Patient at procedure or test/unavailable; PT will check on pt later as time allows.   History of Present Illness Patient is a 66 yo male admitted 07/23/16 with SOB.  Patient with bilateral PE and Rt pneumothorax.  Chest tube places.  Monitoring resolution of ptx.  If not, may have VATS on Monday.   PMH:  hypertension, hyperlipidemia, COPD, stroke, history of PE in 2016, PVD, CAD, CKD   Earney Navy, PTA Pager: [phone]

## 2016-07-31 NOTE — H&P (View-Only) (Signed)
[address]       [place] [address]             [phone]         Subjective: Breathing comfortably  Objective: Vital signs in last 24 hours: Temp:  [97.8 F (36.6 C)-98.8 F (37.1 C)] 98.2 F (36.8 C) (06/08 0337) Pulse Rate:  [74-105] 74 (06/08 0337) Cardiac Rhythm: Normal sinus rhythm (06/08 0729) Resp:  [15-17] 16 (06/08 0337) BP: (101-140)/(65-85) 122/65 (06/08 0337) SpO2:  [98 %-100 %] 99 % (06/08 0337) Weight:  [172 lb 13.5 oz (78.4 kg)] 172 lb 13.5 oz (78.4 kg) (06/08 0337)  Hemodynamic parameters for last 24 hours:    Intake/Output from previous day: 06/07 0701 - 06/08 0700 In: 852.6 [P.O.:620; I.V.:232.6] Out: 1050 [Urine:1050] Intake/Output this shift: No intake/output data recorded.  General appearance: alert, cooperative and no distress Heart: regular rate and rhythm Lungs: clear to auscultation bilaterally Abdomen: benign Extremities: no edema or calf tenderness Wound: dressings curently clean and dry  Lab Results:  Recent Labs  07/27/16 1945 07/28/16 0157  WBC 10.6* 10.9*  HGB 8.0* 7.8*  HCT 24.1* 23.6*  PLT 146* 149*   BMET:  Recent Labs  07/27/16 0829 07/28/16 0157  NA 135 134*  K 3.5 3.8  CL 103 102  CO2 26 26  GLUCOSE 138* 105*  BUN 10 12  CREATININE 1.26* 1.25*  CALCIUM 8.3* 8.3*    PT/INR: No results for input(s): LABPROT, INR in the last 72 hours. ABG No results found for: PHART, HCO3, TCO2, ACIDBASEDEF, O2SAT CBG (last 3)   Recent Labs  07/26/16 0739 07/27/16 0738 07/28/16 0740  GLUCAP 89 111* 96    Meds Scheduled Meds: . amLODipine  10 mg Oral Daily  . benzonatate  100 mg Oral TID  . chlorpheniramine-HYDROcodone  5 mL Oral Q12H  . ezetimibe  10 mg Oral Daily  . feeding supplement (ENSURE ENLIVE)  237 mL Oral BID BM  . guaiFENesin  600 mg Oral BID  . mouth rinse  15 mL Mouth Rinse BID  . rosuvastatin  40 mg Oral q1800  . sodium chloride flush  3 mL Intravenous Q12H    Continuous Infusions: . heparin 1,100 Units/hr (07/28/16 0700)   PRN Meds:.acetaminophen **OR** acetaminophen, albuterol, dextromethorphan-guaiFENesin, hydrALAZINE, ipratropium, morphine injection, ondansetron **OR** ondansetron (ZOFRAN) IV, oxyCODONE-acetaminophen, zolpidem  Xrays Dg Chest Port 1 View  Result Date: 07/28/2016 CLINICAL DATA:  Pneumothorax.  Chest tube . EXAM: PORTABLE CHEST 1 VIEW COMPARISON:  07/27/2016 .  07/26/2016.  CT 01/28/2016. FINDINGS: Right chest tube in stable position. Interim slight worsening of known right-sided pneumothorax with apical and right base pneumothorax noted. Heart size normal. No pleural effusion. IMPRESSION: Right chest tube in stable position. Interim slight worsening of of known right-sided pneumothorax with apical and right base components. These results will be called to the ordering clinician or representative by the Radiologist Assistant, and communication documented in the PACS or zVision Dashboard. Electronically Signed   By: Marcello Moores  Register   On: 07/28/2016 07:16   Dg Chest Port 1 View  Result Date: 07/27/2016 CLINICAL DATA:  Chest tube treatment of right-sided pneumothorax. History of COPD, pulmonary embolism, acute on chronic renal failure. EXAM: PORTABLE CHEST 1 VIEW COMPARISON:  Portable chest x-ray of July 26, 2016 FINDINGS: The lungs are well-expanded. An approximately 5% right apical pneumothorax is demonstrated. The right chest tube overlies the posterior aspect of the third rib. There is no mediastinal shift.  There is stable pleural thickening over the lateral aspect of the right eighth rib. The left lung is clear. The heart and pulmonary vascularity are normal. There is calcification in the wall of the aortic arch. IMPRESSION: 5% right apical pneumothorax. The right chest tube is in stable position. Otherwise stable appearance of the chest since yesterday's study. Electronically Signed   By: David  Martinique M.D.   On: 07/27/2016 07:11   Dg  Chest Port 1 View  Result Date: 07/26/2016 CLINICAL DATA:  Chest tube follow-up EXAM: PORTABLE CHEST 1 VIEW COMPARISON:  Yesterday FINDINGS: Stable positioning of right apical chest tube. No convincingly seen pneumothorax. Mild atelectasis at the right base. The left lung is clear. Normal heart size and mediastinal contours. IMPRESSION: Stable chest tube positioning.  No visible pneumothorax. Electronically Signed   By: Monte Fantasia M.D.   On: 07/26/2016 10:09    Assessment/Plan:  1 clinically stable on H2O seal, slightly increased PNTX- keep tube for now 2 not bleeding from CT site, HGB down a bit further- tolerating clinically , will add iron po- guiac stool just in case CT site doesn't explain Hgb drop completely   LOS: 5 days    Shatona Andujar E 07/28/2016 Patient seen and examined, agree with above' He has a hematoma at the chest tube site but no active bleeding at present Increased pneumothorax on CXR this AM- back to suction. No air leak at present Will try repeat trial of water seal over the weekend to see if we can get the tube out. If not, will plan to proceed with Right VATS, bleb resection on Monday. I discussed the general nature of the procedure, the need for general anesthesia,and the incisions to be used with Mr. Rybicki. We discussed the expected hospital stay, overall recovery and short and long term outcomes. I informed him of the indications, risks, benefits and alternatives. He understands the risks include, but are not limited to death, stroke, MI, DVT/PE, bleeding, possible need for transfusion, infections,other organ system dysfunction including respiratory, renal, or GI complications. He understands the high risk nature of the procedure given his recent pulmonary emboli and issues related to anticoagulation. He accepts the risks and agrees to proceed.  Revonda Standard Roxan Hockey, MD [facility] [phone]

## 2016-07-31 NOTE — Anesthesia Procedure Notes (Signed)
Procedure Name: Intubation Date/Time: 07/31/2016 7:45 AM Performed by: Jacquiline Doe A Pre-anesthesia Checklist: Patient identified, Emergency Drugs available, Suction available and Patient being monitored Patient Re-evaluated:Patient Re-evaluated prior to inductionOxygen Delivery Method: Circle System Utilized and Circle system utilized Preoxygenation: Pre-oxygenation with 100% oxygen Intubation Type: IV induction and Cricoid Pressure applied Ventilation: Mask ventilation without difficulty Laryngoscope Size: Mac and 4 Grade View: Grade I Tube type: Oral Endobronchial tube: Left, Double lumen EBT, EBT position confirmed by auscultation and EBT position confirmed by fiberoptic bronchoscope and 37 Fr Number of attempts: 1 Airway Equipment and Method: Stylet and LTA kit utilized Placement Confirmation: ETT inserted through vocal cords under direct vision,  positive ETCO2 and breath sounds checked- equal and bilateral Secured at: 28 cm Tube secured with: Tape Dental Injury: Teeth and Oropharynx as per pre-operative assessment

## 2016-07-31 NOTE — Progress Notes (Signed)
[facility] PROGRESS NOTE  Patient: Charles [name] VIF:[id number]   PCP: Biagio Borg, MD DOB: [birth year]   DOA: 07/23/2016   DOS: 07/31/2016    Subjective: Underwent VATS today. No acute complaint. Tolerating procedure well.  Objective:  Vitals:   07/31/16 1400 07/31/16 1500  BP: 129/87 131/84  Pulse: 93 83  Resp: (!) 26 15  Temp:      S1-S2 present Clear to auscultation. Bowel sounds present. No focal deficit.  Assessment and plan: Acute PE, continue heparin, transitioned to oral Xarelto unfeasible. Spontaneous pneumothorax, COPD. Cardiac thoracic surgery following, S/P VATS as well as apical bleb removal. Management will cover thoracic surgery. Constipation currently resolved. Monitor on PCA. Continue stool softener. Acute symptomatic anemia, daily monitoring of CBC, transfuse for hemoglobin less than 8.  Author: Berle Mull, MD [facility] Pager: [phone] 07/31/2016 3:53 PM   If 7PM-7AM, please contact night-coverage at [email or web address], password [facility]

## 2016-08-01 ENCOUNTER — Encounter (HOSPITAL_COMMUNITY): Payer: Self-pay | Admitting: Thoracic Surgery (Cardiothoracic Vascular Surgery)

## 2016-08-01 ENCOUNTER — Inpatient Hospital Stay (HOSPITAL_COMMUNITY): Payer: Managed Care, Other (non HMO)

## 2016-08-01 LAB — TYPE AND SCREEN
ABO/RH(D): B POS
Antibody Screen: NEGATIVE
UNIT DIVISION: 0
Unit division: 0
Unit division: 0

## 2016-08-01 LAB — BPAM RBC
BLOOD PRODUCT EXPIRATION DATE: 201806212359
Blood Product Expiration Date: 201806202359
Blood Product Expiration Date: 201806242359
ISSUE DATE / TIME: 201806110723
ISSUE DATE / TIME: 201806110723
ISSUE DATE / TIME: 201806110946
UNIT TYPE AND RH: 7300
UNIT TYPE AND RH: 7300
Unit Type and Rh: 7300

## 2016-08-01 LAB — BASIC METABOLIC PANEL
Anion gap: 5 (ref 5–15)
BUN: 8 mg/dL (ref 6–20)
CHLORIDE: 105 mmol/L (ref 101–111)
CO2: 24 mmol/L (ref 22–32)
CREATININE: 1.13 mg/dL (ref 0.61–1.24)
Calcium: 8.1 mg/dL — ABNORMAL LOW (ref 8.9–10.3)
Glucose, Bld: 120 mg/dL — ABNORMAL HIGH (ref 65–99)
Potassium: 4.9 mmol/L (ref 3.5–5.1)
SODIUM: 134 mmol/L — AB (ref 135–145)

## 2016-08-01 LAB — BLOOD GAS, ARTERIAL
Acid-base deficit: 1.8 mmol/L (ref 0.0–2.0)
Bicarbonate: 21.1 mmol/L (ref 20.0–28.0)
O2 Content: 28 L/min
O2 Saturation: 99.8 %
PH ART: 7.489 — AB (ref 7.350–7.450)
Patient temperature: 98.6
pCO2 arterial: 28 mmHg — ABNORMAL LOW (ref 32.0–48.0)
pO2, Arterial: 164 mmHg — ABNORMAL HIGH (ref 83.0–108.0)

## 2016-08-01 LAB — CBC
HEMATOCRIT: 30.8 % — AB (ref 39.0–52.0)
HEMOGLOBIN: 10.2 g/dL — AB (ref 13.0–17.0)
MCH: 28.7 pg (ref 26.0–34.0)
MCHC: 33.1 g/dL (ref 30.0–36.0)
MCV: 86.5 fL (ref 78.0–100.0)
Platelets: 174 10*3/uL (ref 150–400)
RBC: 3.56 MIL/uL — ABNORMAL LOW (ref 4.22–5.81)
RDW: 21.2 % — AB (ref 11.5–15.5)
WBC: 16.8 10*3/uL — AB (ref 4.0–10.5)

## 2016-08-01 LAB — HEPARIN LEVEL (UNFRACTIONATED): HEPARIN UNFRACTIONATED: 0.25 [IU]/mL — AB (ref 0.30–0.70)

## 2016-08-01 MED ORDER — SODIUM CHLORIDE 0.9% FLUSH
10.0000 mL | INTRAVENOUS | Status: DC | PRN
  Administered 2016-08-05: 10 mL
  Filled 2016-08-01: qty 40

## 2016-08-01 MED ORDER — SODIUM CHLORIDE 0.9% FLUSH
10.0000 mL | Freq: Two times a day (BID) | INTRAVENOUS | Status: DC
Start: 2016-08-01 — End: 2016-08-08
  Administered 2016-08-01 – 2016-08-07 (×12): 10 mL
  Administered 2016-08-07: 20 mL
  Administered 2016-08-08: 10 mL

## 2016-08-01 MED ORDER — ALBUTEROL SULFATE (2.5 MG/3ML) 0.083% IN NEBU
2.5000 mg | INHALATION_SOLUTION | Freq: Four times a day (QID) | RESPIRATORY_TRACT | Status: DC
  Administered 2016-08-01 – 2016-08-02 (×3): 2.5 mg via RESPIRATORY_TRACT
  Filled 2016-08-01 (×5): qty 3

## 2016-08-01 MED ORDER — HEPARIN (PORCINE) IN NACL 100-0.45 UNIT/ML-% IJ SOLN
1400.0000 [IU]/h | INTRAMUSCULAR | Status: DC
  Administered 2016-08-02: 1250 [IU]/h via INTRAVENOUS
  Administered 2016-08-03: 1400 [IU]/h via INTRAVENOUS
  Administered 2016-08-03: 1250 [IU]/h via INTRAVENOUS
  Filled 2016-08-01 (×3): qty 250

## 2016-08-01 MED ORDER — CHLORHEXIDINE GLUCONATE CLOTH 2 % EX PADS
6.0000 | MEDICATED_PAD | Freq: Every day | CUTANEOUS | Status: DC
  Administered 2016-08-01 – 2016-08-08 (×5): 6 via TOPICAL

## 2016-08-01 MED ORDER — HEPARIN (PORCINE) IN NACL 100-0.45 UNIT/ML-% IJ SOLN
1100.0000 [IU]/h | INTRAMUSCULAR | Status: DC
  Administered 2016-08-01: 1100 [IU]/h via INTRAVENOUS
  Filled 2016-08-01: qty 250

## 2016-08-01 NOTE — Progress Notes (Signed)
1 Day Post-Op Procedure(s) (LRB): VIDEO ASSISTED THORACOSCOPY (Right) BLEBECTOMY (Right) Subjective: Some pain from tubes  Objective: Vital signs in last 24 hours: Temp:  [97.3 F (36.3 C)-99.1 F (37.3 C)] 98.1 F (36.7 C) (06/12 0700) Pulse Rate:  [77-103] 77 (06/12 0700) Cardiac Rhythm: Normal sinus rhythm (06/12 0400) Resp:  [9-43] 11 (06/12 0700) BP: (110-151)/(68-120) 122/80 (06/12 0700) SpO2:  [91 %-100 %] 98 % (06/12 0700) Arterial Line BP: (91-183)/(53-91) 135/68 (06/12 0700) Weight:  [174 lb 9.7 oz (79.2 kg)] 174 lb 9.7 oz (79.2 kg) (06/12 0400)  Hemodynamic parameters for last 24 hours:    Intake/Output from previous day: 06/11 0701 - 06/12 0700 In: 5545 [P.O.:720; I.V.:3570; Blood:1005; IV Piggyback:250] Out: 5638 [Urine:2405; Blood:600; Chest Tube:450] Intake/Output this shift: No intake/output data recorded.  General appearance: alert, cooperative and no distress Neurologic: intact Heart: regular rate and rhythm Lungs: diminished breath sounds bibasilar Abdomen: normal findings: soft, non-tender small air leak  Lab Results:  Recent Labs  07/31/16 0559  07/31/16 1026 08/01/16 0351  WBC 12.9*  --   --  16.8*  HGB 8.4*  < > 8.5* 10.2*  HCT 26.0*  < > 25.0* 30.8*  PLT 197  --   --  174  < > = values in this interval not displayed. BMET:  Recent Labs  07/31/16 0559  07/31/16 1026 08/01/16 0351  NA 136  < > 138 134*  K 4.5  < > 4.7 4.9  CL 105  --   --  105  CO2 23  --   --  24  GLUCOSE 103*  --   --  120*  BUN 10  --   --  8  CREATININE 1.29*  --   --  1.13  CALCIUM 8.6*  --   --  8.1*  < > = values in this interval not displayed.  PT/INR:  Recent Labs  07/31/16 0559  LABPROT 13.0  INR 0.98   ABG    Component Value Date/Time   PHART 7.489 (H) 08/01/2016 0415   HCO3 21.1 08/01/2016 0415   TCO2 23 07/31/2016 1026   ACIDBASEDEF 1.8 08/01/2016 0415   O2SAT 99.8 08/01/2016 0415   CBG (last 3)   Recent Labs  07/30/16 0740  GLUCAP  92    Assessment/Plan: S/P Procedure(s) (LRB): VIDEO ASSISTED THORACOSCOPY (Right) BLEBECTOMY (Right) -  POD # 1 Overall doing well Small air leak- leave CT to suction today Advance diet IS, nebs DVT/PE- restart heparin today with no bolus Will keep in ICU as heparin is resumed Switch to oral anticoagulation once tubes are out   LOS: 9 days    Charles [name] 08/01/2016

## 2016-08-01 NOTE — Progress Notes (Signed)
ANTICOAGULATION CONSULT NOTE - Follow Up Consult  Pharmacy Consult for Heparin  Indication: pulmonary embolus  Assessment: 66 y/o M with acute PE diagnosed at OSH per notes. 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 He was noted with a pneumothorax and is s/p VATs on 6/11 (chest tube in place). Pharmacy consulted to restart heparin (no bolus) -last heparin rate was 1100 units/hr with heparin level= 0.38 -Hg was 6.5 on 6/11 and now 10.2 s/p PRBC  Goal of Therapy:  Heparin level 0.3-0.5 units/ml Monitor platelets by anticoagulation protocol: Yes   Plan:  -Resume heparin at 1100 units/hr -Heparin level in 6 hours and daily wth CBC daily  Hildred Laser, Pharm D 08/01/2016 9:02 AM     Allergies  Allergen Reactions  . No Known Allergies     Patient Measurements: Height: 6\' 4"  (193 cm) Weight: 174 lb 9.7 oz (79.2 kg) IBW/kg (Calculated) : 86.8  Vital Signs: Temp: 98.1 F (36.7 C) (06/12 0700) Temp Source: Oral (06/12 0300) BP: 122/80 (06/12 0700) Pulse Rate: 77 (06/12 0700)  Labs:  Recent Labs  07/30/16 0149 07/31/16 0357 07/31/16 0559  07/31/16 0941 07/31/16 1026 08/01/16 0351  HGB 8.9* 8.6* 8.4*  < > 7.5* 8.5* 10.2*  HCT 27.6* 26.7* 26.0*  < > 22.0* 25.0* 30.8*  PLT 212 224 197  --   --   --  174  LABPROT  --   --  13.0  --   --   --   --   INR  --   --  0.98  --   --   --   --   HEPARINUNFRC 0.39 0.38  --   --   --   --   --   CREATININE 1.29* 1.29* 1.29*  --   --   --  1.13  < > = values in this interval not displayed.  Estimated Creatinine Clearance: 75 mL/min (by C-G formula based on SCr of 1.13 mg/dL).

## 2016-08-01 NOTE — Plan of Care (Addendum)
[facility] Consultation Progress Note  Patient: Charles [name] AVW:[id number]   PCP: Biagio Borg, MD DOB: Jun 21, 1950   DOA: 07/23/2016   DOS: 08/01/2016   Date of Service: the patient was seen and examined on 08/01/2016 Primary service: Melrose Nakayama, MD  Subjective: Denies any acute complaint and mentions pain is well-controlled.  Brief hospital course: 66 year old African-American male with a past medical history of hypertension, hyperlipidemia, COPD, stroke, history of PE in 2016, but no longer on anticoagulation, presented with shortness of breath. He was evaluated at an outside facility. Evaluation revealed bilateral pulmonary embolism but also showed right-sided pneumothorax. A chest tube was placed, he was started on heparin and the patient was transferred to this hospital. Cardiac thoracic surgery was consulted, patient underwent chest tube placement on 07/25/2016. Continues to have persistent pneumothorax and therefore underwent VATS with chest tube placement and apical blebectomy on 07/31/2016. Has been in the ICU since then undergone cardiac thoracic surgery. Received 3 PRBC on 08/01/2016. Currently patient's care has been transitioned to Cardiothoracic surgery, highly appreciate their input and assistance in management of this pleasant patient. Discussed with primary team.  Assessment and Plan: 1. Acute pulmonary embolism (HCC) Continue IV heparin. Patient's second episode at present therefore will require lifelong anticoagulation. Patient would like to transition to Xarelto. Transition to oral anticoagulation on the chest tube is removed.  2. Pneumothorax with apical bleb. Appreciate thoracic surgery management.   3. Symptomatic anemia. Anemia due to acute blood loss. S/P 1 PRBC earlier and 3 PRBC on 07/31/2016. H&H currently stable. Daily monitoring of H&H. Continue iron supplementation.  4. Essential hypertension. Continue amlodipine, blood pressure  currently stable. Lisinopril is on hold which I would continue on discharge as long as the blood pressure remains 537S to 827M systolic.  5. Constipation. Continue stool softeners. Continue MiraLAX discharge.  Disposition: We will sign off at present, please call us again as needed.  Recommendation on discharge: Follow-up with PCP in one week with CBC. Continue iron supplementation, stool softener on discharge.  Author: Berle Mull, MD [facility] Pager: [phone] 08/01/2016 4:29 PM  If 7PM-7AM, please contact night-coverage at [email or web address], password [facility]

## 2016-08-01 NOTE — Progress Notes (Addendum)
ANTICOAGULATION CONSULT NOTE - FOLLOW UP    HL = 0.25 (goal 0.3 - 0.5 units/mL) Heparin dosing weight = 79 kg   Assessment: 65 YOM on IV heparin for acute PE.  Heparin was off for pneumothorax requiring VATS on 07/31/16.  Heparin resumed this AM and first level is slightly below goal. Patient was previously therapeutic on 1100 units/hr.  RN reported patient was coughing up blood yesterday; no bleeding today.   Plan: - Increase heparin gtt slightly to 1150 units/hr - Check confirmatory heparin level    Jessly Lebeck D. Mina Marble, PharmD, BCPS 08/01/2016, 4:48 PM

## 2016-08-01 NOTE — Care Management Note (Signed)
Case Management Note Previous CM note initiated by Zenon Mayo, RN--07/24/2016, 12:35 PM   Patient Details  Name: Charles [name] MRN: [id number] Date of Birth: 04/07/1950  Subjective/Objective:    From home with spouse, presents from [facility] in [place] with sob, ptx, bil p.e.'s, per cxr ptx is increased, chest tube has bad air leak. Heparin drip stopped, for u/s on legs today, chest tube site is edematous.  He has [employer].   PTA indep.  6/5 Tomi Bamberger RN, BSN - NCM received call from [facility] from [facility], stating to call her if we need help with any dc needs her phone is [phone] xt [phone].  She states the patient has 28 days of snf benefits, and  And unlimited ltach benefits, as long as meet medical necessity.  The [facility] agency in network with patient's insurance are [place], Sherilyn Cooter, [facility], and [facility].    6/8 Meno, BSN - ptx, bil pe's-  patient conts with chest tube to suction, ptx worse, conts on heparin drip.  NCM awaiting benefit check for eliquis and xarelto.    PCP Cathlean Cower               Action/Plan: NCM will follow for dc needs.  Expected Discharge Date:                  Expected Discharge Plan:  Home/Self Care  In-House Referral:     Discharge planning Services  CM Consult  Post Acute Care Choice:    Choice offered to:     DME Arranged:    DME Agency:     HH Arranged:    HH Agency:     Status of Service:  In process, will continue to follow  If discussed at Long Length of Stay Meetings, dates discussed:    Discharge Disposition:   Additional Comments:  08/01/16- 1010- Casey Maxfield RN, CM- pt tx post op from [facility] to [facility] - s/p VATS on 07/31/16- Cm will continue to follow for d/c needs  Dawayne Patricia, RN 08/01/2016, 10:09 AM [phone]

## 2016-08-01 NOTE — Progress Notes (Signed)
CT surgery p.m. Rounds  Patient doing well sitting up in chair No air leak minimal chest tube drainage Adequate breath sounds Room air saturation 98% on 1 L

## 2016-08-02 ENCOUNTER — Inpatient Hospital Stay (HOSPITAL_COMMUNITY): Payer: Managed Care, Other (non HMO)

## 2016-08-02 LAB — COMPREHENSIVE METABOLIC PANEL
ALK PHOS: 31 U/L — AB (ref 38–126)
ALT: 13 U/L — AB (ref 17–63)
AST: 20 U/L (ref 15–41)
Albumin: 2.5 g/dL — ABNORMAL LOW (ref 3.5–5.0)
Anion gap: 8 (ref 5–15)
BILIRUBIN TOTAL: 0.9 mg/dL (ref 0.3–1.2)
BUN: 10 mg/dL (ref 6–20)
CALCIUM: 8 mg/dL — AB (ref 8.9–10.3)
CO2: 22 mmol/L (ref 22–32)
CREATININE: 1.17 mg/dL (ref 0.61–1.24)
Chloride: 104 mmol/L (ref 101–111)
Glucose, Bld: 101 mg/dL — ABNORMAL HIGH (ref 65–99)
Potassium: 4.6 mmol/L (ref 3.5–5.1)
Sodium: 134 mmol/L — ABNORMAL LOW (ref 135–145)
TOTAL PROTEIN: 5.6 g/dL — AB (ref 6.5–8.1)

## 2016-08-02 LAB — CBC
HCT: 29.1 % — ABNORMAL LOW (ref 39.0–52.0)
Hemoglobin: 9.6 g/dL — ABNORMAL LOW (ref 13.0–17.0)
MCH: 28.9 pg (ref 26.0–34.0)
MCHC: 33 g/dL (ref 30.0–36.0)
MCV: 87.7 fL (ref 78.0–100.0)
Platelets: 157 10*3/uL (ref 150–400)
RBC: 3.32 MIL/uL — ABNORMAL LOW (ref 4.22–5.81)
RDW: 20.6 % — ABNORMAL HIGH (ref 11.5–15.5)
WBC: 16 10*3/uL — ABNORMAL HIGH (ref 4.0–10.5)

## 2016-08-02 LAB — HEPARIN LEVEL (UNFRACTIONATED)
HEPARIN UNFRACTIONATED: 0.25 [IU]/mL — AB (ref 0.30–0.70)
Heparin Unfractionated: 0.38 IU/mL (ref 0.30–0.70)

## 2016-08-02 MED ORDER — IPRATROPIUM-ALBUTEROL 0.5-2.5 (3) MG/3ML IN SOLN: 3.0000 mL | RESPIRATORY_TRACT | Status: DC | PRN

## 2016-08-02 MED ORDER — IPRATROPIUM-ALBUTEROL 0.5-2.5 (3) MG/3ML IN SOLN
3.0000 mL | Freq: Four times a day (QID) | RESPIRATORY_TRACT | Status: DC
  Administered 2016-08-02: 3 mL via RESPIRATORY_TRACT
  Filled 2016-08-02: qty 3

## 2016-08-02 NOTE — Progress Notes (Signed)
2 Days Post-Op Procedure(s) (LRB): VIDEO ASSISTED THORACOSCOPY (Right) BLEBECTOMY (Right) Subjective: No complaints  Objective: Vital signs in last 24 hours: Temp:  [97.2 F (36.2 C)-99 F (37.2 C)] 98.2 F (36.8 C) (06/13 0757) Pulse Rate:  [66-115] 82 (06/13 0800) Cardiac Rhythm: Normal sinus rhythm (06/13 0800) Resp:  [0-29] 18 (06/13 0800) BP: (111-144)/(67-91) 130/82 (06/13 0800) SpO2:  [86 %-100 %] 100 % (06/13 0800) Weight:  [175 lb (79.4 kg)] 175 lb (79.4 kg) (06/13 0700)  Hemodynamic parameters for last 24 hours:    Intake/Output from previous day: 06/12 0701 - 06/13 0700 In: 469.8 [I.V.:469.8] Out: 2280 [Urine:2110; Chest Tube:170] Intake/Output this shift: Total I/O In: 32.5 [I.V.:32.5] Out: 150 [Urine:130; Chest Tube:20]  General appearance: alert, cooperative and no distress Neurologic: intact Heart: regular rate and rhythm Lungs: diminished breath sounds right base Wound: clean and dry no air leak, serosanguinous CT output  Lab Results:  Recent Labs  08/01/16 0351 08/02/16 0227  WBC 16.8* 16.0*  HGB 10.2* 9.6*  HCT 30.8* 29.1*  PLT 174 157   BMET:  Recent Labs  08/01/16 0351 08/02/16 0227  NA 134* 134*  K 4.9 4.6  CL 105 104  CO2 24 22  GLUCOSE 120* 101*  BUN 8 10  CREATININE 1.13 1.17  CALCIUM 8.1* 8.0*    PT/INR:  Recent Labs  07/31/16 0559  LABPROT 13.0  INR 0.98   ABG    Component Value Date/Time   PHART 7.489 (H) 08/01/2016 0415   HCO3 21.1 08/01/2016 0415   TCO2 23 07/31/2016 1026   ACIDBASEDEF 1.8 08/01/2016 0415   O2SAT 99.8 08/01/2016 0415   CBG (last 3)  No results for input(s): GLUCAP in the last 72 hours.  Assessment/Plan: S/P Procedure(s) (LRB): VIDEO ASSISTED THORACOSCOPY (Right) BLEBECTOMY (Right) -Doing well POD # 2 On heparin for DVT/PE preop- will transition to PO once tubes out No air leak- CT to water seal Ambulated twice yesterday- continue Acute blood loss anemia- stable post  transfusion Transfer to [place] step down   LOS: 10 days    Melrose Nakayama 08/02/2016

## 2016-08-02 NOTE — Progress Notes (Signed)
ANTICOAGULATION CONSULT NOTE - Follow Up Consult  Pharmacy Consult for Heparin  Indication: pulmonary embolus  Assessment: 66 y/o M with acute PE diagnosed at OSH per notes. 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 He was noted with a pneumothorax and is s/p VATs on 6/11 (chest tube in place). Pharmacy consulted to restart heparin (no bolus). Plans noted for oral AC once chest tubes are out.  -Hg was 6.5 on 6/11 and now 9.6 -heparin level= 0.38  Goal of Therapy:  Heparin level 0.3-0.5 units/ml Monitor platelets by anticoagulation protocol: Yes   Plan:  -No heparin changes needed -Heparin level  daily wth CBC daily  Hildred Laser, Pharm D 08/02/2016 8:34 AM     Allergies  Allergen Reactions  . No Known Allergies     Patient Measurements: Height: 6\' 4"  (193 cm) Weight: 175 lb (79.4 kg) IBW/kg (Calculated) : 86.8  Vital Signs: Temp: 98.2 F (36.8 C) (06/13 0757) Temp Source: Oral (06/13 0757) BP: 132/79 (06/13 0700) Pulse Rate: 86 (06/13 0700)  Labs:  Recent Labs  07/31/16 0559  07/31/16 1026 08/01/16 0351 08/01/16 1550 08/01/16 2349 08/02/16 0227 08/02/16 0728  HGB 8.4*  < > 8.5* 10.2*  --   --  9.6*  --   HCT 26.0*  < > 25.0* 30.8*  --   --  29.1*  --   PLT 197  --   --  174  --   --  157  --   LABPROT 13.0  --   --   --   --   --   --   --   INR 0.98  --   --   --   --   --   --   --   HEPARINUNFRC  --   --   --   --  0.25* 0.25*  --  0.38  CREATININE 1.29*  --   --  1.13  --   --  1.17  --   < > = values in this interval not displayed.  Estimated Creatinine Clearance: 72.6 mL/min (by C-G formula based on SCr of 1.17 mg/dL).

## 2016-08-02 NOTE — Progress Notes (Signed)
[facility] for Heparin  Indication: pulmonary embolus  Allergies  Allergen Reactions  . No Known Allergies     Patient Measurements: Height: 6\' 4"  (193 cm) Weight: 174 lb 9.7 oz (79.2 kg) IBW/kg (Calculated) : 86.8  Vital Signs: Temp: 97.2 F (36.2 C) (06/12 2300) Temp Source: Oral (06/12 2300) BP: 126/77 (06/12 2000) Pulse Rate: 88 (06/12 2000)  Labs:  Recent Labs  07/31/16 0357 07/31/16 0559  07/31/16 0941 07/31/16 1026 08/01/16 0351 08/01/16 1550 08/01/16 2349  HGB 8.6* 8.4*  < > 7.5* 8.5* 10.2*  --   --   HCT 26.7* 26.0*  < > 22.0* 25.0* 30.8*  --   --   PLT 224 197  --   --   --  174  --   --   LABPROT  --  13.0  --   --   --   --   --   --   INR  --  0.98  --   --   --   --   --   --   HEPARINUNFRC 0.38  --   --   --   --   --  0.25* 0.25*  CREATININE 1.29* 1.29*  --   --   --  1.13  --   --   < > = values in this interval not displayed.  Estimated Creatinine Clearance: 75 mL/min (by C-G formula based on SCr of 1.13 mg/dL).  Assessment: 66 y/o Male with PE, s/p VATS 6/11, for heparin   Goal of Therapy:  Heparin level 0.3-0.5 units/ml Monitor platelets by anticoagulation protocol: Yes   Plan:  Increase Heparin 1250 units/hr Check heparin level in 8 hours.   Phillis Knack, PharmD, BCPS  08/02/2016, 12:43 AM

## 2016-08-02 NOTE — Progress Notes (Signed)
      [address]       [place] [address]             [phone]      Stable day On room air  Awaiting step down bed  Remo Lipps C. Roxan Hockey, MD [facility] [phone]

## 2016-08-03 ENCOUNTER — Inpatient Hospital Stay (HOSPITAL_COMMUNITY): Payer: Managed Care, Other (non HMO)

## 2016-08-03 LAB — CBC
HEMATOCRIT: 27.4 % — AB (ref 39.0–52.0)
Hemoglobin: 8.9 g/dL — ABNORMAL LOW (ref 13.0–17.0)
MCH: 28.4 pg (ref 26.0–34.0)
MCHC: 32.5 g/dL (ref 30.0–36.0)
MCV: 87.5 fL (ref 78.0–100.0)
Platelets: 194 10*3/uL (ref 150–400)
RBC: 3.13 MIL/uL — AB (ref 4.22–5.81)
RDW: 19.7 % — AB (ref 11.5–15.5)
WBC: 14.6 10*3/uL — AB (ref 4.0–10.5)

## 2016-08-03 LAB — BASIC METABOLIC PANEL
ANION GAP: 6 (ref 5–15)
BUN: 9 mg/dL (ref 6–20)
CALCIUM: 8.2 mg/dL — AB (ref 8.9–10.3)
CO2: 24 mmol/L (ref 22–32)
Chloride: 104 mmol/L (ref 101–111)
Creatinine, Ser: 1.24 mg/dL (ref 0.61–1.24)
Glucose, Bld: 90 mg/dL (ref 65–99)
Potassium: 4.4 mmol/L (ref 3.5–5.1)
Sodium: 134 mmol/L — ABNORMAL LOW (ref 135–145)

## 2016-08-03 LAB — GLUCOSE, CAPILLARY: Glucose-Capillary: 82 mg/dL (ref 65–99)

## 2016-08-03 LAB — HEPARIN LEVEL (UNFRACTIONATED)
HEPARIN UNFRACTIONATED: 0.23 [IU]/mL — AB (ref 0.30–0.70)
HEPARIN UNFRACTIONATED: 0.48 [IU]/mL (ref 0.30–0.70)
Heparin Unfractionated: 0.38 IU/mL (ref 0.30–0.70)

## 2016-08-03 MED ORDER — WHITE PETROLATUM GEL
Status: AC
  Administered 2016-08-03: 1
  Filled 2016-08-03: qty 1

## 2016-08-03 NOTE — Progress Notes (Signed)
ANTICOAGULATION CONSULT NOTE - Follow Up Consult  Pharmacy Consult for Heparin  Indication: pulmonary embolus  Allergies  Allergen Reactions  . No Known Allergies     Patient Measurements: Height: 6\' 4"  (193 cm) Weight: 175 lb (79.4 kg) IBW/kg (Calculated) : 86.8  Vital Signs: Temp: 98.4 F (36.9 C) (06/14 1548) Temp Source: Oral (06/14 1548) BP: 108/73 (06/14 1548) Pulse Rate: 89 (06/14 1548)  Labs:  Recent Labs  08/01/16 0351  08/02/16 0227  08/03/16 0500 08/03/16 1230 08/03/16 1844  HGB 10.2*  --  9.6*  --  8.9*  --   --   HCT 30.8*  --  29.1*  --  27.4*  --   --   PLT 174  --  157  --  194  --   --   HEPARINUNFRC  --   < >  --   < > 0.23* 0.38 0.48  CREATININE 1.13  --  1.17  --  1.24  --   --   < > = values in this interval not displayed.  Estimated Creatinine Clearance: 68.5 mL/min (by C-G formula based on SCr of 1.24 mg/dL).   Assessment: 66 y/o M with acute PE diagnosed at OSH per notes. 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 He was noted with a pneumothorax and is s/p VATs on 6/11 (chest tube in place). Pharmacy consulted to restart heparin (no bolus). Plans noted for oral AC once chest tubes are out.   Heparin level therapeutic x 2    Goal of Therapy:  Heparin level 0.3-0.5 units/ml Monitor platelets by anticoagulation protocol: Yes   Plan:  Continue heparin at 1400 units/hr F/u AM labs  Thank you Anette Guarneri, PharmD [phone]  08/03/2016 7:36 PM

## 2016-08-03 NOTE — Progress Notes (Addendum)
      [address]       [place] [address]             [phone]      3 Days Post-Op Procedure(s) (LRB): VIDEO ASSISTED THORACOSCOPY (Right) BLEBECTOMY (Right) Subjective: Feels good this morning. On room air  Objective: Vital signs in last 24 hours: Temp:  [98.3 F (36.8 C)-99.7 F (37.6 C)] 98.4 F (36.9 C) (06/14 0740) Pulse Rate:  [85-108] 101 (06/14 0336) Cardiac Rhythm: Sinus tachycardia (06/13 1922) Resp:  [14-30] 18 (06/14 0800) BP: (108-127)/(60-78) 125/78 (06/14 0740) SpO2:  [91 %-100 %] 91 % (06/14 0800)     Intake/Output from previous day: 06/13 0701 - 06/14 0700 In: 2801.5 [P.O.:2520; I.V.:281.5] Out: 2180 [Urine:2010; Chest Tube:170] Intake/Output this shift: Total I/O In: 14 [I.V.:14] Out: 10 [Chest Tube:10]  General appearance: alert, cooperative and no distress Heart: sinus tachycardia Lungs: clear to auscultation bilaterally Abdomen: soft, non-tender; bowel sounds normal; no masses,  no organomegaly Extremities: extremities normal, atraumatic, no cyanosis or edema Wound: clean and dry  Lab Results:  Recent Labs  08/02/16 0227 08/03/16 0500  WBC 16.0* 14.6*  HGB 9.6* 8.9*  HCT 29.1* 27.4*  PLT 157 194   BMET:  Recent Labs  08/02/16 0227 08/03/16 0500  NA 134* 134*  K 4.6 4.4  CL 104 104  CO2 22 24  GLUCOSE 101* 90  BUN 10 9  CREATININE 1.17 1.24  CALCIUM 8.0* 8.2*    PT/INR: No results for input(s): LABPROT, INR in the last 72 hours. ABG    Component Value Date/Time   PHART 7.489 (H) 08/01/2016 0415   HCO3 21.1 08/01/2016 0415   TCO2 23 07/31/2016 1026   ACIDBASEDEF 1.8 08/01/2016 0415   O2SAT 99.8 08/01/2016 0415   CBG (last 3)   Recent Labs  08/03/16 0745  GLUCAP 82    Assessment/Plan: S/P Procedure(s) (LRB): VIDEO ASSISTED THORACOSCOPY (Right) BLEBECTOMY (Right)   1. Overall progressing well POD #3 2. On heparin for DVT/PE, transition to PO once chest tubes removed 3. Chest tubes with  173ml out in 24 hours. On water seal. CXR this morning showed no pneumothorax, worsening right lung aeration with increasing airspace disease particularly in the lateral mid lung. No air leak. 4. Creatinine 1.24 this morning, up from 1.17. Electrolytes okay.  5. H and H 8.9/27.4, acute blood loss anemia s/p post-op transfusion, platelets trending up  6. WBC trending down 7. Pain control-PCA pump, PRN Oxy, ultram, and morphine.   Plan: Progressing well. Continue chest tube for now. Will discuss removal with Dr. Roxan Hockey.    LOS: 11 days    Elgie Collard 08/03/2016 Looks good overall No air leak, good tidal variation, will dc anterior chest tube CXR shows more opacity on right. I'm not convinced it is air space disease. Given slight drop in hemoglobin it could be some loculated blood. Will follow  Revonda Standard. Roxan Hockey, MD [facility] [phone]

## 2016-08-03 NOTE — Progress Notes (Signed)
DC central line and R anterior chest tube without complications. Placed vaseline guaze over chest tube site due to pre knotted suture per Donielle, PA. Site CDI. No air leak noted. Redressed surgical incisions, no oozing, CDI, staples intact. No complaints of SOB, SpO2 95%, RR 18. Will continue to monitor, encouraged pulmonary toilet.   DC fentanyl PCA with Phineas Douglas, RN, waste 1mg  fentanyl in sink.

## 2016-08-03 NOTE — Progress Notes (Signed)
ANTICOAGULATION CONSULT NOTE - Follow Up Consult  Pharmacy Consult for Heparin  Indication: pulmonary embolus  Allergies  Allergen Reactions  . No Known Allergies     Patient Measurements: Height: 6\' 4"  (193 cm) Weight: 175 lb (79.4 kg) IBW/kg (Calculated) : 86.8  Vital Signs: Temp: 98.3 F (36.8 C) (06/14 1129) Temp Source: Oral (06/14 1129) BP: 112/70 (06/14 1129) Pulse Rate: 91 (06/14 1129)  Labs:  Recent Labs  08/01/16 0351  08/02/16 0227 08/02/16 0728 08/03/16 0500 08/03/16 1230  HGB 10.2*  --  9.6*  --  8.9*  --   HCT 30.8*  --  29.1*  --  27.4*  --   PLT 174  --  157  --  194  --   HEPARINUNFRC  --   < >  --  0.38 0.23* 0.38  CREATININE 1.13  --  1.17  --  1.24  --   < > = values in this interval not displayed.  Estimated Creatinine Clearance: 68.5 mL/min (by C-G formula based on SCr of 1.24 mg/dL).   Assessment: 66 y/o M with acute PE diagnosed at OSH per notes. 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 He was noted with a pneumothorax and is s/p VATs on 6/11 (chest tube in place). Pharmacy consulted to restart heparin (no bolus). Plans noted for oral AC once chest tubes are out.   Heparin level this afternoon is therapeutic after a rate increase earlier today (HL 0.38  << 0.23, goal of 0.3-0.5). Hgb/Hct slight drop this AM however no bleeding issues are reported at this time. Per MD note - "CXR shows more R-opacity, possible loculated blood" - will monitor.   Goal of Therapy:  Heparin level 0.3-0.5 units/ml Monitor platelets by anticoagulation protocol: Yes   Plan:  1. Continue Heparin at 1400 units/hr (14 ml/hr) 2. Will continue to monitor for any signs/symptoms of bleeding and will follow up with heparin level in 6 hours to confirm therapeutic  Thank you for allowing pharmacy to be a part of this patient's care.  Alycia Rossetti, PharmD, BCPS Clinical Pharmacist Pager: [phone] Clinical phone for  08/03/2016 from 7a-3:30p: [phone] If after 3:30p, please call main pharmacy at: [phone] 08/03/2016 3:22 PM

## 2016-08-03 NOTE — Progress Notes (Signed)
ANTICOAGULATION CONSULT NOTE - Follow Up Consult  Pharmacy Consult for Heparin  Indication: pulmonary embolus  Allergies  Allergen Reactions  . No Known Allergies     Patient Measurements: Height: 6\' 4"  (193 cm) Weight: 175 lb (79.4 kg) IBW/kg (Calculated) : 86.8  Vital Signs: Temp: 99.7 F (37.6 C) (06/14 0336) Temp Source: Oral (06/14 0336) BP: 116/69 (06/14 0336) Pulse Rate: 101 (06/14 0336)  Labs:  Recent Labs  08/01/16 0351  08/01/16 2349 08/02/16 0227 08/02/16 0728 08/03/16 0500  HGB 10.2*  --   --  9.6*  --  8.9*  HCT 30.8*  --   --  29.1*  --  27.4*  PLT 174  --   --  157  --  194  HEPARINUNFRC  --   < > 0.25*  --  0.38 0.23*  CREATININE 1.13  --   --  1.17  --   --   < > = values in this interval not displayed.  Estimated Creatinine Clearance: 72.6 mL/min (by C-G formula based on SCr of 1.17 mg/dL).   Assessment: 66 y/o M with acute PE diagnosed at OSH per notes. 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 He was noted with a pneumothorax and is s/p VATs on 6/11 (chest tube in place). Pharmacy consulted to restart heparin (no bolus). Plans noted for oral AC once chest tubes are out.   Heparin level down to subtherapeutic (0.23) on gtt at 1250 units/hr. No issues with line or bleeding reported per RN. Hgb 8.9 - downward trend over past couple of days.  Goal of Therapy:  Heparin level 0.3-0.5 units/ml Monitor platelets by anticoagulation protocol: Yes   Plan:  Increase heparin to 1400 units/hr F/u 6 hr heparin level   Sherlon Handing, PharmD, BCPS Clinical pharmacist, pager [phone] 08/03/2016 6:04 AM

## 2016-08-04 ENCOUNTER — Inpatient Hospital Stay (HOSPITAL_COMMUNITY): Payer: Managed Care, Other (non HMO) | Admitting: Certified Registered Nurse Anesthetist

## 2016-08-04 ENCOUNTER — Encounter (HOSPITAL_COMMUNITY)
Admission: AD | Disposition: A | Discharge status: Home / Self Care | Payer: Self-pay | Source: Other Acute Inpatient Hospital | Attending: Thoracic Surgery (Cardiothoracic Vascular Surgery)

## 2016-08-04 ENCOUNTER — Inpatient Hospital Stay (HOSPITAL_COMMUNITY): Payer: Managed Care, Other (non HMO)

## 2016-08-04 ENCOUNTER — Encounter (HOSPITAL_COMMUNITY): Payer: Self-pay | Admitting: Certified Registered Nurse Anesthetist

## 2016-08-04 DIAGNOSIS — J942 Hemothorax: Secondary | ICD-10-CM

## 2016-08-04 DIAGNOSIS — I2782 Chronic pulmonary embolism: Secondary | ICD-10-CM

## 2016-08-04 HISTORY — PX: VENA CAVA FILTER PLACEMENT: SHX1085

## 2016-08-04 HISTORY — PX: VIDEO ASSISTED THORACOSCOPY: SHX5073

## 2016-08-04 LAB — BASIC METABOLIC PANEL
ANION GAP: 7 (ref 5–15)
BUN: 17 mg/dL (ref 6–20)
CALCIUM: 8.2 mg/dL — AB (ref 8.9–10.3)
CO2: 21 mmol/L — ABNORMAL LOW (ref 22–32)
Chloride: 105 mmol/L (ref 101–111)
Creatinine, Ser: 1.56 mg/dL — ABNORMAL HIGH (ref 0.61–1.24)
GFR, EST AFRICAN AMERICAN: 53 mL/min — AB (ref >60)
GFR, EST NON AFRICAN AMERICAN: 46 mL/min — AB (ref >60)
Glucose, Bld: 112 mg/dL — ABNORMAL HIGH (ref 65–99)
Potassium: 4.4 mmol/L (ref 3.5–5.1)
Sodium: 133 mmol/L — ABNORMAL LOW (ref 135–145)

## 2016-08-04 LAB — CBC
HCT: 23.3 % — ABNORMAL LOW (ref 39.0–52.0)
Hemoglobin: 7.6 g/dL — ABNORMAL LOW (ref 13.0–17.0)
MCH: 28.8 pg (ref 26.0–34.0)
MCHC: 32.6 g/dL (ref 30.0–36.0)
MCV: 88.3 fL (ref 78.0–100.0)
PLATELETS: 221 10*3/uL (ref 150–400)
RBC: 2.64 MIL/uL — ABNORMAL LOW (ref 4.22–5.81)
RDW: 19.2 % — AB (ref 11.5–15.5)
WBC: 23.6 10*3/uL — AB (ref 4.0–10.5)

## 2016-08-04 LAB — SURGICAL PCR SCREEN
MRSA, PCR: NEGATIVE
Staphylococcus aureus: NEGATIVE

## 2016-08-04 LAB — GLUCOSE, CAPILLARY: Glucose-Capillary: 105 mg/dL — ABNORMAL HIGH (ref 65–99)

## 2016-08-04 LAB — HEPARIN LEVEL (UNFRACTIONATED): HEPARIN UNFRACTIONATED: 0.46 [IU]/mL (ref 0.30–0.70)

## 2016-08-04 LAB — PREPARE RBC (CROSSMATCH)

## 2016-08-04 SURGERY — VIDEO ASSISTED THORACOSCOPY
Anesthesia: General | Site: Chest | Laterality: Right

## 2016-08-04 MED ORDER — DEXTROSE 5 % IV SOLN
1.5000 g | INTRAVENOUS | Status: AC
  Administered 2016-08-04: 1.5 g via INTRAVENOUS
  Filled 2016-08-04: qty 1.5

## 2016-08-04 MED ORDER — DEXTROSE-NACL 5-0.9 % IV SOLN
INTRAVENOUS | Status: DC
  Administered 2016-08-04 – 2016-08-05 (×2): via INTRAVENOUS

## 2016-08-04 MED ORDER — LACTATED RINGERS IV SOLN
INTRAVENOUS | Status: DC | PRN
  Administered 2016-08-04: 12:00:00 via INTRAVENOUS

## 2016-08-04 MED ORDER — FENTANYL CITRATE (PF) 250 MCG/5ML IJ SOLN
INTRAMUSCULAR | Status: AC
  Filled 2016-08-04: qty 5

## 2016-08-04 MED ORDER — CEFUROXIME SODIUM 1.5 G IV SOLR
1.5000 g | Freq: Once | INTRAVENOUS | Status: AC
  Administered 2016-08-04: 1.5 g via INTRAVENOUS
  Filled 2016-08-04 (×2): qty 1.5

## 2016-08-04 MED ORDER — LACTATED RINGERS IV SOLN
INTRAVENOUS | Status: DC | PRN
Start: 2016-08-04 — End: 2016-08-04
  Administered 2016-08-04: 12:00:00 via INTRAVENOUS

## 2016-08-04 MED ORDER — DEXAMETHASONE SODIUM PHOSPHATE 10 MG/ML IJ SOLN
INTRAMUSCULAR | Status: DC | PRN
  Administered 2016-08-04: 10 mg via INTRAVENOUS

## 2016-08-04 MED ORDER — MIDAZOLAM HCL 5 MG/5ML IJ SOLN
INTRAMUSCULAR | Status: DC | PRN
  Administered 2016-08-04: 2 mg via INTRAVENOUS

## 2016-08-04 MED ORDER — ROCURONIUM BROMIDE 100 MG/10ML IV SOLN
INTRAVENOUS | Status: DC | PRN
  Administered 2016-08-04: 10 mg via INTRAVENOUS
  Administered 2016-08-04: 50 mg via INTRAVENOUS
  Administered 2016-08-04: 10 mg via INTRAVENOUS

## 2016-08-04 MED ORDER — 0.9 % SODIUM CHLORIDE (POUR BTL) OPTIME
TOPICAL | Status: DC | PRN
  Administered 2016-08-04: 2000 mL

## 2016-08-04 MED ORDER — PROPOFOL 10 MG/ML IV BOLUS
INTRAVENOUS | Status: DC | PRN
  Administered 2016-08-04: 130 mg via INTRAVENOUS

## 2016-08-04 MED ORDER — PROPOFOL 10 MG/ML IV BOLUS
INTRAVENOUS | Status: AC
  Filled 2016-08-04: qty 20

## 2016-08-04 MED ORDER — PHENYLEPHRINE HCL 10 MG/ML IJ SOLN
INTRAMUSCULAR | Status: DC | PRN
  Administered 2016-08-04: 25 ug/min via INTRAVENOUS

## 2016-08-04 MED ORDER — SODIUM CHLORIDE 0.9 % IJ SOLN
INTRAVENOUS | Status: DC | PRN
  Administered 2016-08-04: 10 mL via INTRAMUSCULAR

## 2016-08-04 MED ORDER — PHENYLEPHRINE HCL 10 MG/ML IJ SOLN
INTRAMUSCULAR | Status: DC | PRN
  Administered 2016-08-04 (×2): 200 ug via INTRAVENOUS

## 2016-08-04 MED ORDER — SUGAMMADEX SODIUM 200 MG/2ML IV SOLN
INTRAVENOUS | Status: DC | PRN
  Administered 2016-08-04: 200 mg via INTRAVENOUS

## 2016-08-04 MED ORDER — ALBUMIN HUMAN 5 % IV SOLN
INTRAVENOUS | Status: DC | PRN
  Administered 2016-08-04: 14:00:00 via INTRAVENOUS

## 2016-08-04 MED ORDER — ONDANSETRON HCL 4 MG/2ML IJ SOLN
INTRAMUSCULAR | Status: DC | PRN
  Administered 2016-08-04: 4 mg via INTRAVENOUS

## 2016-08-04 MED ORDER — CEFUROXIME SODIUM 1.5 G IV SOLR: 1.5000 g | Freq: Two times a day (BID) | INTRAVENOUS | Status: DC

## 2016-08-04 MED ORDER — FENTANYL CITRATE (PF) 100 MCG/2ML IJ SOLN
INTRAMUSCULAR | Status: DC | PRN
  Administered 2016-08-04: 75 ug via INTRAVENOUS
  Administered 2016-08-04: 25 ug via INTRAVENOUS
  Administered 2016-08-04 (×4): 50 ug via INTRAVENOUS

## 2016-08-04 MED ORDER — MIDAZOLAM HCL 2 MG/2ML IJ SOLN
INTRAMUSCULAR | Status: AC
  Filled 2016-08-04: qty 2

## 2016-08-04 MED ORDER — SODIUM CHLORIDE 0.9 % IV SOLN: 10.0000 mL/h | Freq: Once | INTRAVENOUS | Status: DC

## 2016-08-04 SURGICAL SUPPLY — 100 items
APPLIER CLIP ROT 10 11.4 M/L (STAPLE)
BAG DECANTER FOR FLEXI CONT (MISCELLANEOUS) IMPLANT
BLADE SURG 11 STRL SS (BLADE) IMPLANT
CANISTER SUCT 3000ML PPV (MISCELLANEOUS) ×4 IMPLANT
CATH KIT ON Q 5IN SLV (PAIN MANAGEMENT) IMPLANT
CATH THORACIC 28FR (CATHETERS) IMPLANT
CATH THORACIC 28FR RT ANG (CATHETERS) IMPLANT
CATH THORACIC 36FR (CATHETERS) IMPLANT
CATH THORACIC 36FR RT ANG (CATHETERS) IMPLANT
CLIP APPLIE ROT 10 11.4 M/L (STAPLE) IMPLANT
CLIP TI MEDIUM 6 (CLIP) IMPLANT
CONN Y 3/8X3/8X3/8  BEN (MISCELLANEOUS)
CONN Y 3/8X3/8X3/8 BEN (MISCELLANEOUS) IMPLANT
CONT SPEC 4OZ CLIKSEAL STRL BL (MISCELLANEOUS) IMPLANT
COVER BACK TABLE 60X90IN (DRAPES) ×4 IMPLANT
COVER PROBE W GEL 5X96 (DRAPES) IMPLANT
COVER SURGICAL LIGHT HANDLE (MISCELLANEOUS) ×4 IMPLANT
DERMABOND ADVANCED (GAUZE/BANDAGES/DRESSINGS)
DERMABOND ADVANCED .7 DNX12 (GAUZE/BANDAGES/DRESSINGS) IMPLANT
DRAIN CHANNEL 28F RND 3/8 FF (WOUND CARE) ×4 IMPLANT
DRAIN CHANNEL 32F RND 10.7 FF (WOUND CARE) IMPLANT
DRAPE C-ARM 42X72 X-RAY (DRAPES) ×4 IMPLANT
DRAPE FEMORAL ANGIO 80X135IN (DRAPES) ×4 IMPLANT
DRAPE LAPAROSCOPIC ABDOMINAL (DRAPES) ×4 IMPLANT
DRAPE LAPAROTOMY T 102X78X121 (DRAPES) ×4 IMPLANT
DRAPE WARM FLUID 44X44 (DRAPE) IMPLANT
ELECT BLADE 6.5 EXT (BLADE) ×4 IMPLANT
ELECT REM PT RETURN 9FT ADLT (ELECTROSURGICAL) ×4
ELECTRODE REM PT RTRN 9FT ADLT (ELECTROSURGICAL) ×2 IMPLANT
FILTER VC CELECT-FEMORAL (Filter) ×8 IMPLANT
GAUZE SPONGE 4X4 12PLY STRL (GAUZE/BANDAGES/DRESSINGS) ×4 IMPLANT
GAUZE SPONGE 4X4 12PLY STRL LF (GAUZE/BANDAGES/DRESSINGS) ×8 IMPLANT
GAUZE SPONGE 4X4 16PLY XRAY LF (GAUZE/BANDAGES/DRESSINGS) ×4 IMPLANT
GLOVE SS BIOGEL STRL SZ 7.5 (GLOVE) ×2 IMPLANT
GLOVE SUPERSENSE BIOGEL SZ 7.5 (GLOVE) ×2
GLOVE SURG SIGNA 7.5 PF LTX (GLOVE) ×4 IMPLANT
GOWN STRL REUS W/ TWL LRG LVL3 (GOWN DISPOSABLE) ×8 IMPLANT
GOWN STRL REUS W/ TWL XL LVL3 (GOWN DISPOSABLE) ×2 IMPLANT
GOWN STRL REUS W/TWL LRG LVL3 (GOWN DISPOSABLE) ×16
GOWN STRL REUS W/TWL XL LVL3 (GOWN DISPOSABLE) ×4
HEMOSTAT SURGICEL 2X14 (HEMOSTASIS) IMPLANT
IV CATH 22GX1 FEP (IV SOLUTION) IMPLANT
KIT BASIN OR (CUSTOM PROCEDURE TRAY) ×4 IMPLANT
KIT ROOM TURNOVER OR (KITS) ×4 IMPLANT
KIT SUCTION CATH 14FR (SUCTIONS) ×4 IMPLANT
NEEDLE 22X1 1/2 (OR ONLY) (NEEDLE) IMPLANT
NEEDLE HYPO 25GX1X1/2 BEV (NEEDLE) IMPLANT
NEEDLE PERC 18GX7CM (NEEDLE) IMPLANT
NS IRRIG 1000ML POUR BTL (IV SOLUTION) ×12 IMPLANT
PACK CHEST (CUSTOM PROCEDURE TRAY) ×4 IMPLANT
PACK SURGICAL SETUP 50X90 (CUSTOM PROCEDURE TRAY) IMPLANT
PAD ARMBOARD 7.5X6 YLW CONV (MISCELLANEOUS) ×16 IMPLANT
POUCH ENDO CATCH II 15MM (MISCELLANEOUS) IMPLANT
POUCH SPECIMEN RETRIEVAL 10MM (ENDOMECHANICALS) IMPLANT
PROTECTION STATION PRESSURIZED (MISCELLANEOUS) ×4
SEALANT PROGEL (MISCELLANEOUS) IMPLANT
SEALANT SURG COSEAL 4ML (VASCULAR PRODUCTS) IMPLANT
SEALANT SURG COSEAL 8ML (VASCULAR PRODUCTS) IMPLANT
SOLUTION ANTI FOG 6CC (MISCELLANEOUS) ×4 IMPLANT
SPECIMEN JAR MEDIUM (MISCELLANEOUS) IMPLANT
SPONGE INTESTINAL PEANUT (DISPOSABLE) ×16 IMPLANT
SPONGE LAP 18X18 X RAY DECT (DISPOSABLE) IMPLANT
SPONGE TONSIL 1 RF SGL (DISPOSABLE) ×4 IMPLANT
STAPLER VISISTAT 35W (STAPLE) ×4 IMPLANT
STATION PROTECTION PRESSURIZED (MISCELLANEOUS) ×2 IMPLANT
SUT ETHILON 3 0 PS 1 (SUTURE) IMPLANT
SUT PROLENE 4 0 RB 1 (SUTURE)
SUT PROLENE 4-0 RB1 .5 CRCL 36 (SUTURE) IMPLANT
SUT SILK  1 MH (SUTURE) ×4
SUT SILK 1 MH (SUTURE) ×4 IMPLANT
SUT SILK 2 0SH CR/8 30 (SUTURE) IMPLANT
SUT SILK 3 0SH CR/8 30 (SUTURE) IMPLANT
SUT VIC AB 1 CTX 36 (SUTURE) ×3
SUT VIC AB 1 CTX36XBRD ANBCTR (SUTURE) ×2 IMPLANT
SUT VIC AB 2-0 CTX 27 (SUTURE) ×4 IMPLANT
SUT VIC AB 2-0 CTX 36 (SUTURE) ×4 IMPLANT
SUT VIC AB 2-0 UR6 27 (SUTURE) IMPLANT
SUT VIC AB 3-0 MH 27 (SUTURE) IMPLANT
SUT VIC AB 3-0 X1 27 (SUTURE) ×4 IMPLANT
SUT VICRYL 2 TP 1 (SUTURE) IMPLANT
SYR 20CC LL (SYRINGE) ×8 IMPLANT
SYR 20ML ECCENTRIC (SYRINGE) IMPLANT
SYR 30ML LL (SYRINGE) ×4 IMPLANT
SYR 5ML LL (SYRINGE) IMPLANT
SYR CONTROL 10ML LL (SYRINGE) IMPLANT
SYSTEM SAHARA CHEST DRAIN ATS (WOUND CARE) ×4 IMPLANT
TAPE CLOTH 4X10 WHT NS (GAUZE/BANDAGES/DRESSINGS) ×4 IMPLANT
TAPE CLOTH SURG 4X10 WHT LF (GAUZE/BANDAGES/DRESSINGS) ×4 IMPLANT
TAPE PAPER 3X10 WHT MICROPORE (GAUZE/BANDAGES/DRESSINGS) ×4 IMPLANT
TIP APPLICATOR SPRAY EXTEND 16 (VASCULAR PRODUCTS) IMPLANT
TOWEL GREEN STERILE (TOWEL DISPOSABLE) ×4 IMPLANT
TOWEL GREEN STERILE FF (TOWEL DISPOSABLE) IMPLANT
TRAY FOLEY W/METER SILVER 16FR (SET/KITS/TRAYS/PACK) ×4 IMPLANT
TROCAR XCEL BLADELESS 5X75MML (TROCAR) ×4 IMPLANT
TROCAR XCEL NON-BLD 5MMX100MML (ENDOMECHANICALS) IMPLANT
TUNNELER SHEATH ON-Q 11GX8 DSP (PAIN MANAGEMENT) IMPLANT
WATER STERILE IRR 1000ML POUR (IV SOLUTION) ×8 IMPLANT
WIRE BENTSON .035X145CM (WIRE) ×4 IMPLANT
WIRE J 3MM .035X145CM (WIRE) IMPLANT
WIRE MINI STICK MAX (SHEATH) ×4 IMPLANT

## 2016-08-04 NOTE — Anesthesia Preprocedure Evaluation (Addendum)
Anesthesia Evaluation  Patient identified by MRN, date of birth, ID band Patient confused    Reviewed: Allergy & Precautions, NPO status , Patient's Chart, lab work & pertinent test results  Airway Mallampati: II  TM Distance: >3 FB Neck ROM: Full    Dental  (+) Teeth Intact, Dental Advisory Given   Pulmonary former smoker,    + rhonchi  + decreased breath sounds      Cardiovascular hypertension,  Rhythm:Regular Rate:Tachycardia     Neuro/Psych    GI/Hepatic   Endo/Other    Renal/GU      Musculoskeletal   Abdominal   Peds  Hematology   Anesthesia Other Findings   Reproductive/Obstetrics                             Anesthesia Physical Anesthesia Plan  ASA: IV  Anesthesia Plan: General   Post-op Pain Management:    Induction: Intravenous  PONV Risk Score and Plan: Ondansetron and Dexamethasone  Airway Management Planned: Double Lumen EBT  Additional Equipment:   Intra-op Plan:   Post-operative Plan: Extubation in OR  Informed Consent: I have reviewed the patients History and Physical, chart, labs and discussed the procedure including the risks, benefits and alternatives for the proposed anesthesia with the patient or authorized representative who has indicated his/her understanding and acceptance.     Plan Discussed with: Anesthesiologist and CRNA  Anesthesia Plan Comments:         Anesthesia Quick Evaluation

## 2016-08-04 NOTE — Op Note (Signed)
    OPERATIVE REPORT  DATE OF SURGERY: 08/04/2016  PATIENT: PHENIX VANDERMEULEN, 66 y.o. male MRN: [id number]  DOB: 11-10-1950  PRE-OPERATIVE DIAGNOSIS: Pulmonary embolus with contraindication to anticoagulation  POST-OPERATIVE DIAGNOSIS:  Same  PROCEDURE: Lacinda Axon Celect vena cava filter placement   SURGEON:  Curt Jews, M.D.  PHYSICIAN ASSISTANT:  nurse  ANESTHESIA:   Gen.  EBL:  minimal ml  Total I/O In: 2070 [I.V.:1100; Blood:670; IV Piggyback:300] Out: 823 [Urine:425; Blood:150; Chest Tube:248]  BLOOD ADMINISTERED:  none  DRAINS:  none none  SPECIMEN:  none  COUNTS CORRECT:  YES  PLAN OF CARE:  PACU   PATIENT DISPOSITION:  PACU - hemodynamically stable  PROCEDURE DETAILS:  the patient went to the debridement of his thoracotomy which be dictated as separate note with Dr. Roxan Hockey. Following this patient was placed in supine position. Using SonoSite ultrasound and after prepping both groins the right common femoral vein was entered and a guidewire was passed centrally. A sheath was passed over the guidewire and the guidewire was positioned the level of the diaphragm. The long marker sheath from the [facility] device was positioned at approximately L2-L3. Hand injection at this level revealed a normal size vena cava and the level of the renal veins. The dilator was removed and a vena caval filter was positioned the appropriate level and was deployed from the sheath. This was released from the constrained device. The sheath was removed and pressure was held for hemostasis. Patient was transferred to recovery room in stable condition   Rosetta Posner, M.D., [facility] 08/04/2016 4:46 PM

## 2016-08-04 NOTE — Brief Op Note (Signed)
07/23/2016 - 08/04/2016  2:03 PM  PATIENT:  [name]  66 y.o. male  PRE-OPERATIVE DIAGNOSIS:  right hemothorax  POST-OPERATIVE DIAGNOSIS:  right hemothorax  PROCEDURE:  Procedure(s):  REDO VIDEO ASSISTED THORACOSCOPY-  EVACUATION OF HEMOTHORAX (Right)  SURGEON:  Surgeon(s) and Role:    * Melrose Nakayama, MD - Primary    * Early, Arvilla Meres, MD - Assisting  PHYSICIAN ASSISTANT: Ellwood Handler PA-C  ANESTHESIA:   general  EBL:  Total I/O In: 77 [Blood:670] Out: 450 [Urine:150; Blood:150; Chest Tube:150]  BLOOD ADMINISTERED:2 units CC PRBC  DRAINS: 28 Blake, Previous chest tube   LOCAL MEDICATIONS USED:  NONE  SPECIMEN:  No Specimen  DISPOSITION OF SPECIMEN:  N/A  COUNTS:  YES  TOURNIQUET:  * No tourniquets in log *  DICTATION: .Dragon Dictation  PLAN OF CARE: Admit to inpatient   PATIENT DISPOSITION:  PACU - hemodynamically stable.   Delay start of Pharmacological VTE agent (>24hrs) due to surgical blood loss or risk of bleeding: yes

## 2016-08-04 NOTE — Anesthesia Procedure Notes (Signed)
Central Venous Catheter Insertion Performed by: Roberts Gaudy, anesthesiologist Start/End6/15/2018 12:00 PM, 08/04/2016 12:10 PM Patient location: Pre-op. Preanesthetic checklist: patient identified, IV checked, site marked, risks and benefits discussed, surgical consent, monitors and equipment checked, pre-op evaluation and timeout performed Position: supine Lidocaine 1% used for infiltration Hand hygiene performed , maximum sterile barriers used  and Seldinger technique used Catheter size: 8 Fr Central line was placed.Double lumen Procedure performed using ultrasound guided technique. Ultrasound Notes:anatomy identified Attempts: 1 Following insertion, line sutured, dressing applied and Biopatch. Post procedure assessment: blood return through all ports, free fluid flow and no air  Patient tolerated the procedure well with no immediate complications.

## 2016-08-04 NOTE — Anesthesia Procedure Notes (Signed)
Procedure Name: Intubation Date/Time: 08/04/2016 12:22 PM Performed by: Shirlyn Goltz Pre-anesthesia Checklist: Patient identified, Emergency Drugs available, Suction available and Patient being monitored Patient Re-evaluated:Patient Re-evaluated prior to inductionOxygen Delivery Method: Circle system utilized Preoxygenation: Pre-oxygenation with 100% oxygen Intubation Type: IV induction Ventilation: Mask ventilation without difficulty Laryngoscope Size: Mac and 4 Grade View: Grade I Endobronchial tube: Left, Double lumen EBT, EBT position confirmed by auscultation and EBT position confirmed by fiberoptic bronchoscope and 37 Fr Number of attempts: 1 Airway Equipment and Method: Stylet Placement Confirmation: ETT inserted through vocal cords under direct vision,  positive ETCO2 and breath sounds checked- equal and bilateral Tube secured with: Tape Dental Injury: Teeth and Oropharynx as per pre-operative assessment

## 2016-08-04 NOTE — Progress Notes (Signed)
Received report from Opal Sidles at [facility] regarding chest xray, she reports;  "Significant in crease pleural density on right New from prior exam Compression of adjacent lung Likely represents enlarging effusion Possibility of underlying hemorrhage No pneumothorax noted"  Dr. Roxan Hockey has already seen patient this morning, discontinued heparin and told patient he may have to go back to OR. MD advises patient is to be NPO. Patient is aware of POC and has no questions at this time.

## 2016-08-04 NOTE — Consult Note (Signed)
[facility] [place]  Patient name: Charles [name] MRN: [id number] DOB: [birth year] Sex: male  REASON FOR CONSULT: Vena cava cava filter placement  HPI: Charles [name] is a 66 y.o. male, who is seen in consultation for vena cava filter placement. He had is being returned to the operating room for VATS for evacuation of hematoma. Does have a history of PE and was on anticoagulation. He has contraindication to resuming this with recurrent bleeding. He is currently sedated and is having anesthesia lines placed. I have spoken at length with his wife explaining the need for vena caval filter placement to reduce his risk for recurrent pulmonary embolus.  Past Medical History:  Diagnosis Date  . ABNORMAL ELECTROCARDIOGRAM 11/02/2008  . ABSCESS, LUNG 10/23/2006  . Anemia 05/26/2014  . BACTERIAL PNEUMONIA 12/28/2009  . BPH (benign prostatic hyperplasia) 11/22/2010  . 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 CHEST PAIN 12/24/2009  . Coronary artery calcification seen on CT scan 06/01/2015  . Cramp of limb 07/19/2007  . DIVERTICULOSIS, COLON 03/07/2007  . DVT (deep venous thrombosis) ([place]) 01/2014   LLE  . Emphysema of lung ([place])   . 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/23/2006  . FLANK PAIN, LEFT 02/25/2010  . FREQUENCY, URINARY 12/24/2009  . HEMORRHOIDS 10/23/2006  . HYPERLIPIDEMIA 10/23/2006  . HYPERTENSION 10/23/2006  . PE (pulmonary embolism) 02/2014  . PLANTAR FASCIITIS, LEFT 11/02/2008  . Pneumonia 12/2013 X 2  . PSA, INCREASED 11/20/2007  . PULMONARY NODULE 05/14/2008  . Unspecified Peripheral Vascular Disease 10/23/2006    Family History  Problem Relation Age of Onset  . Heart disease Mother   . Heart disease Father   . Kidney disease Sister        Renal transplant  . Colon cancer Neg Hx     SOCIAL HISTORY: Social History   Social History  . Marital status: Married    Spouse name: N/A  . Number of children: 2  . Years of education: N/A   Occupational  History  . retired    Social History Main Topics  . Smoking status: Former Smoker    Packs/day: 1.00    Years: 15.00    Types: Cigarettes  . Smokeless tobacco: Never Used     Comment: "quit smoking cigarettes in the 1990's"  . Alcohol use No     Comment: stopped drinking 02/2014, was drinking abou 4 shots per week  . Drug use: Yes    Types: Marijuana     Comment: 03/09/2014 "smoke marijuana q couple days"  . Sexual activity: Yes   Other Topics Concern  . Not on file   Social History Narrative  . No narrative on file    Allergies  Allergen Reactions  . No Known Allergies     Current Facility-Administered Medications  Medication Dose Route Frequency Provider Last Rate Last Dose  . 0.9 %  sodium chloride infusion   Intravenous Continuous Melrose Nakayama, MD   Stopped at 08/03/16 1610  . 0.9 %  sodium chloride infusion  10 mL/hr Intravenous Once Dongell, Tyrone Nine, CRNA      . [MAR Hold] acetaminophen (TYLENOL) tablet 1,000 mg  1,000 mg Oral Q6H Gold, Wayne E, PA-C   1,000 mg at 08/04/16 9983   Or  . [JAS Hold] acetaminophen (TYLENOL) solution 1,000 mg  1,000 mg Oral Q6H Gold, Wayne E, PA-C      . [MAR Hold] amLODipine (NORVASC) tablet 10 mg  10 mg Oral Daily Ivor Costa, MD  10 mg at 08/04/16 0926  . [MAR Hold] benzonatate (TESSALON) capsule 100 mg  100 mg Oral TID Lavina Hamman, MD   100 mg at 08/04/16 0926  . [MAR Hold] cefUROXime (ZINACEF) 1.5 g in dextrose 5 % 50 mL IVPB  1.5 g Intravenous To OR Melrose Nakayama, MD      . Doug Sou Hold] cefUROXime (ZINACEF) 1.5 g in dextrose 5 % 50 mL IVPB  1.5 g Intravenous Once Melrose Nakayama, MD      . Doug Sou Hold] Chlorhexidine Gluconate Cloth 2 % PADS 6 each  6 each Topical Daily Melrose Nakayama, MD   6 each at 08/04/16 0913  . [MAR Hold] chlorpheniramine-HYDROcodone (TUSSIONEX) 10-8 MG/5ML suspension 5 mL  5 mL Oral Q12H Verlee Monte, MD   5 mL at 08/04/16 0926  . [MAR Hold] dextromethorphan-guaiFENesin (MUCINEX DM)  30-600 MG per 12 hr tablet 1 tablet  1 tablet Oral BID PRN Ivor Costa, MD      . Doug Sou Hold] ezetimibe (ZETIA) tablet 10 mg  10 mg Oral Daily Ivor Costa, MD   10 mg at 08/04/16 0926  . [MAR Hold] ferrous sulfate tablet 325 mg  325 mg Oral TID WC Gold, Wayne E, PA-C   325 mg at 08/04/16 0926  . [MAR Hold] hydrALAZINE (APRESOLINE) injection 5 mg  5 mg Intravenous Q2H PRN Ivor Costa, MD      . Doug Sou Hold] ipratropium-albuterol (DUONEB) 0.5-2.5 (3) MG/3ML nebulizer solution 3 mL  3 mL Inhalation Q4H PRN Melrose Nakayama, MD      . Doug Sou Hold] MEDLINE mouth rinse  15 mL Mouth Rinse BID Melrose Nakayama, MD   15 mL at 08/04/16 0926  . [MAR Hold] morphine 4 MG/ML injection 2 mg  2 mg Intravenous Q2H PRN Melrose Nakayama, MD      . Doug Sou Hold] oxyCODONE (Oxy IR/ROXICODONE) immediate release tablet 5-10 mg  5-10 mg Oral Q4H PRN Jadene Pierini E, PA-C   10 mg at 08/04/16 1660  . [MAR Hold] pantoprazole (PROTONIX) EC tablet 40 mg  40 mg Oral Daily Gold, Wayne E, PA-C   40 mg at 08/04/16 0926  . [MAR Hold] polyethylene glycol (MIRALAX / GLYCOLAX) packet 17 g  17 g Oral Daily Lavina Hamman, MD   17 g at 08/03/16 1030  . [MAR Hold] potassium chloride 10 mEq in 50 mL *CENTRAL LINE* IVPB  10 mEq Intravenous Daily PRN Gold, Wayne E, PA-C      . [MAR Hold] rosuvastatin (CRESTOR) tablet 40 mg  40 mg Oral q1800 Ivor Costa, MD   40 mg at 08/03/16 1719  . [MAR Hold] senna-docusate (Senokot-S) tablet 1 tablet  1 tablet Oral QHS Jadene Pierini E, PA-C   1 tablet at 08/03/16 2138  . [MAR Hold] sodium chloride flush (NS) 0.9 % injection 10-40 mL  10-40 mL Intracatheter Q12H Melrose Nakayama, MD   10 mL at 08/04/16 0927  . [MAR Hold] sodium chloride flush (NS) 0.9 % injection 10-40 mL  10-40 mL Intracatheter PRN Melrose Nakayama, MD      . Doug Sou Hold] traMADol Veatrice Bourbon) tablet 50-100 mg  50-100 mg Oral Q6H PRN John Giovanni, PA-C        REVIEW OF SYSTEMS:  Reviewed in his chart with nothing to add PHYSICAL  EXAM: Vitals:   08/03/16 2325 08/04/16 0256 08/04/16 0741 08/04/16 0914  BP: 114/73 111/73 110/79 96/67  Pulse: (!) 107 (!) 123  (!) 122  Resp: 19 (!) 23  (!) 28  Temp: 98.6 F (37 C) 98.6 F (37 C) 98.2 F (36.8 C) 98.9 F (37.2 C)  TempSrc: Oral Oral Oral Oral  SpO2: 93% 98%  97%  Weight:      Height:        GENERAL: The patient is a well-nourished male, in no acute distress. The vital signs are documented above. CARDIOVASCULAR: Palpable radial pulses PULMONARY: There is good air exchange  ABDOMEN: Soft and non-tender  MUSCULOSKELETAL: There are no major deformities or cyanosis. NEUROLOGIC: No focal weakness or paresthesias are detected. SKIN: There are no ulcers or rashes noted. PSYCHIATRIC: The patient has a normal affect.    MEDICAL ISSUES: Contraindication to anticoagulation in need of thoracic surgery. Agree with indication for vena cava filter placement. Discussed with wife who understands. We'll places the same operating operating room setting as the VATS procedure.   Rosetta Posner, MD FACS [facility] Tel [phone] Pager [phone]

## 2016-08-04 NOTE — Progress Notes (Addendum)
[address]       [employer] [address]             [phone]      4 Days Post-Op Procedure(s) (LRB): VIDEO ASSISTED THORACOSCOPY (Right) BLEBECTOMY (Right) Subjective: Feels ok, drainage is bloody, CXR results noted and reviewed  Objective: Vital signs in last 24 hours: Temp:  [98.2 F (36.8 C)-98.6 F (37 C)] 98.2 F (36.8 C) (06/15 0741) Pulse Rate:  [89-123] 123 (06/15 0256) Cardiac Rhythm: (P) Sinus tachycardia (06/15 0800) Resp:  [18-23] 23 (06/15 0256) BP: (108-118)/(70-79) 110/79 (06/15 0741) SpO2:  [93 %-98 %] 98 % (06/15 0256)  Hemodynamic parameters for last 24 hours:    Intake/Output from previous day: 06/14 0701 - 06/15 0700 In: 1253.7 [P.O.:840; I.V.:413.7] Out: 1740 [Urine:1625; Chest Tube:115] Intake/Output this shift: No intake/output data recorded.  General appearance: alert, cooperative and no distress Heart: regular rate and rhythm and tachy Lungs: dim BS on right Abdomen: benign Extremities: no edema Wound: Dressings CDI  Lab Results:  Recent Labs  08/03/16 0500 08/04/16 0754  WBC 14.6* 23.6*  HGB 8.9* 7.6*  HCT 27.4* 23.3*  PLT 194 221   BMET:  Recent Labs  08/02/16 0227 08/03/16 0500  NA 134* 134*  K 4.6 4.4  CL 104 104  CO2 22 24  GLUCOSE 101* 90  BUN 10 9  CREATININE 1.17 1.24  CALCIUM 8.0* 8.2*    PT/INR: No results for input(s): LABPROT, INR in the last 72 hours. ABG    Component Value Date/Time   PHART 7.489 (H) 08/01/2016 0415   HCO3 21.1 08/01/2016 0415   TCO2 23 07/31/2016 1026   ACIDBASEDEF 1.8 08/01/2016 0415   O2SAT 99.8 08/01/2016 0415   CBG (last 3)   Recent Labs  08/03/16 0745 08/04/16 0738  GLUCAP 82 105*    Meds Scheduled Meds: . acetaminophen  1,000 mg Oral Q6H   Or  . acetaminophen (TYLENOL) oral liquid 160 mg/5 mL  1,000 mg Oral Q6H  . amLODipine  10 mg Oral Daily  . benzonatate  100 mg Oral TID  . Chlorhexidine Gluconate Cloth  6 each Topical Daily  .  chlorpheniramine-HYDROcodone  5 mL Oral Q12H  . ezetimibe  10 mg Oral Daily  . ferrous sulfate  325 mg Oral TID WC  . mouth rinse  15 mL Mouth Rinse BID  . pantoprazole  40 mg Oral Daily  . polyethylene glycol  17 g Oral Daily  . rosuvastatin  40 mg Oral q1800  . senna-docusate  1 tablet Oral QHS  . sodium chloride flush  10-40 mL Intracatheter Q12H   Continuous Infusions: . sodium chloride Stopped (08/03/16 1610)  . potassium chloride     PRN Meds:.dextromethorphan-guaiFENesin, hydrALAZINE, ipratropium-albuterol, morphine injection, oxyCODONE, potassium chloride, sodium chloride flush, traMADol  Xrays Dg Chest Port 1 View  Result Date: 08/04/2016 CLINICAL DATA:  Status post VATS on the right EXAM: PORTABLE CHEST 1 VIEW COMPARISON:  08/03/2016 FINDINGS: Cardiac shadow is stable. The left lung remains well aerated. Right-sided chest tube is again seen. Significant increase in pleural based density is noted when compare with the previous day consistent with rapidly enlarging effusion. Possibility of underlying hemorrhage would deserve consideration. One of the 2 chest tubes has been removed in the interval. No definitive pneumothorax is seen. Previously seen right jugular central line is no longer identified. IMPRESSION: Significant increased pleural based density on the right new from the prior exam. Compression of the  adjacent lung is noted in this likely represents enlarging effusion. Possibility of underlying hemorrhage would deserve consideration as well. No pneumothorax is noted. These results will be called to the ordering clinician or representative by the Radiologist Assistant, and communication documented in the PACS or zVision Dashboard. Electronically Signed   By: Inez Catalina M.D.   On: 08/04/2016 07:41   Dg Chest Port 1 View  Result Date: 08/03/2016 CLINICAL DATA:  Right-sided chest tubes. EXAM: PORTABLE CHEST 1 VIEW COMPARISON:  08/02/2016 FINDINGS: Two right-sided chest tubes  remain in place. The more lateral chest tube scratched of the tip of the more lateral chest tube projects slightly lower in the right upper lobe region and may have been mildly retracted in the interim. A right jugular catheter terminates over the SVC. The cardiomediastinal silhouette is unchanged. There is increased confluent opacity in the lateral right mid lung. Right basilar opacity is stable to slightly increased, and there is also new mild right upper lobe opacity. The left lung is clear. No pneumothorax is identified. Small volume subcutaneous emphysema is partially visualized in the right chest wall. IMPRESSION: 1. Right chest tubes as above with slight retraction of the more lateral tube. No pneumothorax. 2. Worsening right lung aeration with increasing airspace disease particularly in the lateral mid lung. Electronically Signed   By: Logan Bores M.D.   On: 08/03/2016 07:59    Assessment/Plan: S/P Procedure(s) (LRB): VIDEO ASSISTED THORACOSCOPY (Right) BLEBECTOMY (Right)   1 appears to be re-bleeding, heparin has been stopped, CT appears to be functioning currently- made NPO as may require re-exploration 2 monitor H/H closely as may need transfusion 3 may need vena cava filter as doesn't appear to be an anticoagulation candidate 4 hopefully leukocytosis is just reactive- he is afebrile   LOS: 12 days    GOLD,WAYNE E 08/04/2016 Patient seen and examined, agree with above He has bled into the chest and has a significant hematoma in the chest. After stripping CT drained about 200 ml, not nearly enough to make a difference. He needs to go back to the OR for a right VATS, evacuation of hemothorax. Will try to do around noon so that he has been off heparin long enough. He understands the indications, risks, benefits and alternatives.  Will ask Vascular to see re: a temporary IVC filter so that we can wait longer before restarting heparin again postop    Remo Lipps C. Roxan Hockey, MD [facility] [phone]

## 2016-08-04 NOTE — Progress Notes (Signed)
ANTICOAGULATION CONSULT NOTE - Follow Up Consult  Pharmacy Consult for Heparin (now held) Indication: pulmonary embolus  Allergies  Allergen Reactions  . No Known Allergies     Patient Measurements: Height: 6\' 4"  (193 cm) Weight: 175 lb (79.4 kg) IBW/kg (Calculated) : 86.8  Vital Signs: Temp: 98.2 F (36.8 C) (06/15 0741) Temp Source: Oral (06/15 0741) BP: 110/79 (06/15 0741) Pulse Rate: 123 (06/15 0256)  Labs:  Recent Labs  08/02/16 0227  08/03/16 0500 08/03/16 1230 08/03/16 1844 08/04/16 0234 08/04/16 0754  HGB 9.6*  --  8.9*  --   --   --  7.6*  HCT 29.1*  --  27.4*  --   --   --  23.3*  PLT 157  --  194  --   --   --  221  HEPARINUNFRC  --   < > 0.23* 0.38 0.48 0.46  --   CREATININE 1.17  --  1.24  --   --   --  1.56*  < > = values in this interval not displayed.  Estimated Creatinine Clearance: 54.4 mL/min (A) (by C-G formula based on SCr of 1.56 mg/dL (H)).   Assessment: 66 y/o M with acute PE diagnosed at OSH per notes. 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 He was noted with a pneumothorax and is s/p VATs on 6/11 (chest tube in place). Pharmacy consulted to restart heparin (no bolus). Plans noted for oral AC once chest tubes are out.   Heparin level this morning is therapeutic (HL 0.46 << 0 .48, goal of 0.3-0.5). The patient's heparin drip was held this morning per MD due to continued drops in Hgb and bloody drainage noted. The patient was made NPO for possible plans for re-exploration.   Goal of Therapy:  Heparin level 0.3-0.5 units/ml Monitor platelets by anticoagulation protocol: Yes   Plan:  1. Heparin held for MD 2. Will discontinue heparin consult, please re-consult [place] if the drip is intended to be resumed 3. Will monitor peripherally for anticoagulation plans vs IVC filter placement.  Thank you for allowing pharmacy to be a part of this patient's care.  Alycia Rossetti, PharmD, BCPS Clinical  Pharmacist Pager: [phone] Clinical phone for 08/04/2016 from 7a-3:30p: [phone] If after 3:30p, please call main pharmacy at: [phone] 08/04/2016 8:41 AM

## 2016-08-04 NOTE — Op Note (Signed)
NAMETAIGA, LUPINACCI NO.:  [id number]  MEDICAL RECORD NO.:  [id number]  LOCATION:                                 FACILITY:  PHYSICIAN:  Revonda Standard. Roxan Hockey, M.D. DATE OF BIRTH:  DATE OF PROCEDURE:  08/04/2016 DATE OF DISCHARGE:                              OPERATIVE REPORT   PREOPERATIVE DIAGNOSIS:  Right hemothorax.  POSTOPERATIVE DIAGNOSIS:  Right hemothorax.  PROCEDURE:  Redo right video-assisted thoracoscopy, evacuation of hemothorax.  SURGEON:  Revonda Standard. Roxan Hockey, M.D.  ASSISTANTEllwood Handler, PA.  ANESTHESIA:  General.  FINDINGS:  Large clotted hemothorax, approximately 1 L of blood and clot evacuated.  CLINICAL NOTE:  Mr. Rounsaville is a 66 year old gentleman who presented initially with pulmonary emboli and a right spontaneous pneumothorax. He had a tube placed and was started on heparin for his pulmonary emboli.  He had a persistent air leak and bleeding which clogged his chest tube, therefore was taken for right video-assisted thoracoscopy and resection of apical blebs.  He had 1 chest tube removed on the day prior to surgery, and then, this morning was noted to have a large loculated pleural effusion consistent with a hemothorax.  His hematocrit had dropped by 3 points as well.  He was advised to go back to the operating room for redo right VATS and evacuation of the hemothorax. Heparin drip was discontinued.  The patient was seen in consultation by Dr. Sherren Mocha early for placement of an inferior vena cava filter at the time of his VATS procedure.  The indications, risks, benefits, and alternatives were discussed in detail with the patient, he understood and accepted the risks and agreed to proceed.  OPERATIVE NOTE:  Mr. Boomer was brought to the preoperative holding area on August 04, 2016.  Anesthesia established central venous access and arterial blood pressure monitoring line, he was taken to the operating room, anesthetized, and  intubated with a double-lumen endotracheal tube. Intravenous antibiotics were administered.  A Foley catheter was placed. Sequential compression devices were placed on the calves for DVT prophylaxis.  He was placed in a left lateral decubitus position, and the right chest was prepped and draped in usual sterile fashion.  Single lung ventilation of the left lung was initiated and was tolerated well throughout the procedure.  The staples were removed from the prior working incision in the right chest.  The sutures were cut and the subcutaneous tissue and muscles were separated.  The suture from his anterior chest tube site was removed, and a 5-mm port was placed through this.  The thoracoscope was advanced into the chest. There was a large clotted hemothorax.  This was evacuated using suction and sponge sticks to remove clots.  The chest was periodically irrigated with warm saline during the removal of the clots. No active bleeding site was identified.  The clot was very adherent to the chest wall and also to the underlying lung, but once the clot had been removed, the underlying lung did reinflate nicely.  The chest was copiously irrigated with a liter of warm saline.  A test inflation showed no significant air leaks.  The clot was removed from the pre-existing posterior chest  tube and it was placed back in the posterior position.  A 28-French Blake drain was placed through the anterior port site and directed to the apex.  It was secured to skin with #1 silk suture.  Dual lung ventilation was resumed.  The incision was closed in a standard fashion.  Staples were again used on the skin. The patient was placed back in a supine position.  The chest tubes were placed to suction.  Dr. Sherren Mocha Early will perform placement of an inferior vena cava filter.     Revonda Standard Roxan Hockey, M.D.     SCH/MEDQ  D:  08/04/2016  T:  08/04/2016  Job:  [id number]

## 2016-08-04 NOTE — Progress Notes (Signed)
      [address]       [place] [address]             [phone]      No complaints BP 127/75   Pulse (!) 103   Temp 97.9 F (36.6 C)   Resp (!) 24   Ht 6\' 4"  (1.93 m)   Wt 175 lb (79.4 kg)   SpO2 96%   BMI 21.30 kg/m   Intake/Output Summary (Last 24 hours) at 08/04/16 1831 Last data filed at 08/04/16 1739  Gross per 24 hour  Intake             2828 ml  Output             1508 ml  Net             1320 ml   About 200 ml of serosanguinous fluid in pleuravac  Doing well early postop  Remo Lipps C. Roxan Hockey, MD [facility] [phone]

## 2016-08-04 NOTE — Anesthesia Postprocedure Evaluation (Signed)
Anesthesia Post Note  Patient: Charles [name]  Procedure(s) Performed: Procedure(s) (LRB): REDO VIDEO ASSISTED THORACOSCOPY- EVACUATION OF HEMOTHORAX (Right) INSERTION VENA-CAVA FILTER (Right)     Patient location during evaluation: PACU Anesthesia Type: General Level of consciousness: awake and oriented Pain management: pain level controlled Vital Signs Assessment: post-procedure vital signs reviewed and stable Respiratory status: spontaneous breathing, respiratory function stable, nonlabored ventilation and patient connected to nasal cannula oxygen Cardiovascular status: blood pressure returned to baseline Anesthetic complications: no    Last Vitals:  Vitals:   08/04/16 1610 08/04/16 1615  BP: 127/75   Pulse: (!) 107 (!) 103  Resp: (!) 25 (!) 24  Temp:      Last Pain:  Vitals:   08/04/16 0914  TempSrc: Oral  PainSc:                  [name]

## 2016-08-04 NOTE — Transfer of Care (Signed)
Immediate Anesthesia Transfer of Care Note  Patient: Charles [name]  Procedure(s) Performed: Procedure(s): REDO VIDEO ASSISTED THORACOSCOPY- EVACUATION OF HEMOTHORAX (Right) INSERTION VENA-CAVA FILTER (Right)  Patient Location: PACU  Anesthesia Type:General  Level of Consciousness: awake, alert , oriented and patient cooperative  Airway & Oxygen Therapy: Patient Spontanous Breathing and Patient connected to nasal cannula oxygen  Post-op Assessment: Report given to RN and Post -op Vital signs reviewed and stable  Post vital signs: Reviewed and stable  Last Vitals:  Vitals:   08/04/16 0914 08/04/16 1512  BP: 96/67   Pulse: (!) 122   Resp: (!) 28   Temp: 37.2 C (P) 36.6 C    Last Pain:  Vitals:   08/04/16 0914  TempSrc: Oral  PainSc:       Patients Stated Pain Goal: 7 (02/40/97 3532)  Complications: No apparent anesthesia complications

## 2016-08-04 NOTE — Progress Notes (Signed)
Pt has remained in SInus tach during pacu stay, Dr Linna Caprice in to see pt  And made aware pt returning to room

## 2016-08-05 ENCOUNTER — Inpatient Hospital Stay (HOSPITAL_COMMUNITY): Payer: Managed Care, Other (non HMO)

## 2016-08-05 LAB — TYPE AND SCREEN
ABO/RH(D): B POS
Antibody Screen: NEGATIVE
Unit division: 0
Unit division: 0

## 2016-08-05 LAB — BPAM RBC
BLOOD PRODUCT EXPIRATION DATE: 201806222359
Blood Product Expiration Date: 201806222359
ISSUE DATE / TIME: 201806151141
ISSUE DATE / TIME: 201806151141
Unit Type and Rh: 7300
Unit Type and Rh: 7300

## 2016-08-05 LAB — POCT I-STAT 7, (LYTES, BLD GAS, ICA,H+H)
Acid-base deficit: 4 mmol/L — ABNORMAL HIGH (ref 0.0–2.0)
BICARBONATE: 23.4 mmol/L (ref 20.0–28.0)
Calcium, Ion: 1.06 mmol/L — ABNORMAL LOW (ref 1.15–1.40)
HCT: 25 % — ABNORMAL LOW (ref 39.0–52.0)
HEMOGLOBIN: 8.5 g/dL — AB (ref 13.0–17.0)
O2 Saturation: 100 %
PO2 ART: 368 mmHg — AB (ref 83.0–108.0)
Potassium: 5.6 mmol/L — ABNORMAL HIGH (ref 3.5–5.1)
SODIUM: 135 mmol/L (ref 135–145)
TCO2: 25 mmol/L (ref 0–100)
pCO2 arterial: 55.9 mmHg — ABNORMAL HIGH (ref 32.0–48.0)
pH, Arterial: 7.234 — ABNORMAL LOW (ref 7.350–7.450)

## 2016-08-05 NOTE — Progress Notes (Signed)
  Progress Note    08/05/2016 8:12 AM 1 Day Post-Op  Subjective:  No complaints this morning  Afebrile VSS 98% RA  Vitals:   08/05/16 0000 08/05/16 0412  BP: 119/70 118/69  Pulse: 96 91  Resp: (!) 22 (!) 27  Temp:  98.6 F (37 C)    Physical Exam: Lungs:  Non labored Incisions:   Right groin is soft without hematoma  CBC    Component Value Date/Time   WBC 23.6 (H) 08/04/2016 0754   RBC 2.64 (L) 08/04/2016 0754   HGB 8.5 (L) 08/04/2016 1405   HCT 25.0 (L) 08/04/2016 1405   PLT 221 08/04/2016 0754   MCV 88.3 08/04/2016 0754   MCH 28.8 08/04/2016 0754   MCHC 32.6 08/04/2016 0754   RDW 19.2 (H) 08/04/2016 0754   LYMPHSABS 2.0 04/28/2016 1015   MONOABS 0.8 04/28/2016 1015   EOSABS 0.1 04/28/2016 1015   BASOSABS 0.1 04/28/2016 1015    BMET    Component Value Date/Time   NA 135 08/04/2016 1405   K 5.6 (H) 08/04/2016 1405   CL 105 08/04/2016 0754   CO2 21 (L) 08/04/2016 0754   GLUCOSE 112 (H) 08/04/2016 0754   BUN 17 08/04/2016 0754   CREATININE 1.56 (H) 08/04/2016 0754   CALCIUM 8.2 (L) 08/04/2016 0754   GFRNONAA 46 (L) 08/04/2016 0754   GFRAA 53 (L) 08/04/2016 0754    INR    Component Value Date/Time   INR 0.98 07/31/2016 0559     Intake/Output Summary (Last 24 hours) at 08/05/16 0812 Last data filed at 08/05/16 0414  Gross per 24 hour  Intake             2375 ml  Output             2173 ml  Net              202 ml     Assessment:  65 y.o. male is s/p:  Cook Celect vena cava filter placement   1 Day Post-Op  Plan: -right groin is soft without hematoma -hgb improved after 2 units PRBC's   Leontine Locket, PA-C [facility] [phone] 08/05/2016 8:12 AM

## 2016-08-05 NOTE — Progress Notes (Signed)
1 Day Post-Op Procedure(s) (LRB): REDO VIDEO ASSISTED THORACOSCOPY- EVACUATION OF HEMOTHORAX (Right) INSERTION VENA-CAVA FILTER (Right) Subjective: No complaints this AM but is anxious to get out of the hospital  Objective: Vital signs in last 24 hours: Temp:  [97.9 F (36.6 C)-98.6 F (37 C)] 98.6 F (37 C) (06/16 0412) Pulse Rate:  [91-115] 91 (06/16 0412) Cardiac Rhythm: Normal sinus rhythm (06/16 0700) Resp:  [15-37] 27 (06/16 0412) BP: (99-136)/(65-101) 118/69 (06/16 0412) SpO2:  [95 %-100 %] 98 % (06/16 0412) Arterial Line BP: (101-145)/(42-69) 116/42 (06/16 0000)  Hemodynamic parameters for last 24 hours:    Intake/Output from previous day: 06/15 0701 - 06/16 0700 In: 2375 [P.O.:180; I.V.:1225; Blood:670; IV Piggyback:300] Out: 2173 [Urine:1525; Blood:150; Chest Tube:498] Intake/Output this shift: No intake/output data recorded.  General appearance: alert, cooperative and no distress Neurologic: intact Heart: regular rate and rhythm Lungs: diminished breath sounds bibasilar Abdomen: normal findings: soft, non-tender no air leak, serosanguinous drainage from CT (rose)  Lab Results:  Recent Labs  08/03/16 0500 08/04/16 0754 08/04/16 1405  WBC 14.6* 23.6*  --   HGB 8.9* 7.6* 8.5*  HCT 27.4* 23.3* 25.0*  PLT 194 221  --    BMET:  Recent Labs  08/03/16 0500 08/04/16 0754 08/04/16 1405  NA 134* 133* 135  K 4.4 4.4 5.6*  CL 104 105  --   CO2 24 21*  --   GLUCOSE 90 112*  --   BUN 9 17  --   CREATININE 1.24 1.56*  --   CALCIUM 8.2* 8.2*  --     PT/INR: No results for input(s): LABPROT, INR in the last 72 hours. ABG    Component Value Date/Time   PHART 7.234 (L) 08/04/2016 1405   HCO3 23.4 08/04/2016 1405   TCO2 25 08/04/2016 1405   ACIDBASEDEF 4.0 (H) 08/04/2016 1405   O2SAT 100.0 08/04/2016 1405   CBG (last 3)   Recent Labs  08/03/16 0745 08/04/16 0738  GLUCAP 82 105*    Assessment/Plan: S/P Procedure(s) (LRB): REDO VIDEO ASSISTED  THORACOSCOPY- EVACUATION OF HEMOTHORAX (Right) INSERTION VENA-CAVA FILTER (Right) -Doing well POD # 1 redo VATS to evacuate hemothorax - No air leak- CT to water seal - CXR shows small amount of residual fluid/ clot/ pleural thickening laterally but overall lokks good - pain well controlled - IVC filter in place for DVT/ PE. No anticoagulation due to recurrent bleeding issues - ambulate - dc A line   LOS: 13 days    Melrose Nakayama 08/05/2016

## 2016-08-06 ENCOUNTER — Inpatient Hospital Stay (HOSPITAL_COMMUNITY): Payer: Managed Care, Other (non HMO)

## 2016-08-06 LAB — CBC
HCT: 26.1 % — ABNORMAL LOW (ref 39.0–52.0)
Hemoglobin: 8.5 g/dL — ABNORMAL LOW (ref 13.0–17.0)
MCH: 29 pg (ref 26.0–34.0)
MCHC: 32.6 g/dL (ref 30.0–36.0)
MCV: 89.1 fL (ref 78.0–100.0)
PLATELETS: 255 10*3/uL (ref 150–400)
RBC: 2.93 MIL/uL — ABNORMAL LOW (ref 4.22–5.81)
RDW: 17.5 % — AB (ref 11.5–15.5)
WBC: 21.5 10*3/uL — ABNORMAL HIGH (ref 4.0–10.5)

## 2016-08-06 LAB — BASIC METABOLIC PANEL
Anion gap: 6 (ref 5–15)
BUN: 19 mg/dL (ref 6–20)
CALCIUM: 7.9 mg/dL — AB (ref 8.9–10.3)
CO2: 20 mmol/L — ABNORMAL LOW (ref 22–32)
CREATININE: 1.2 mg/dL (ref 0.61–1.24)
Chloride: 109 mmol/L (ref 101–111)
Glucose, Bld: 117 mg/dL — ABNORMAL HIGH (ref 65–99)
Potassium: 4.6 mmol/L (ref 3.5–5.1)
SODIUM: 135 mmol/L (ref 135–145)

## 2016-08-06 NOTE — Progress Notes (Signed)
Right posterior chest tube removed assisted by Shirlean Mylar RN. Dressed with Vaseline gauze and tape. Pt tolerated well. No s/s of distress at this time.

## 2016-08-06 NOTE — Progress Notes (Signed)
2 Days Post-Op Procedure(s) (LRB): REDO VIDEO ASSISTED THORACOSCOPY- EVACUATION OF HEMOTHORAX (Right) INSERTION VENA-CAVA FILTER (Right) Subjective: No complaints. Denies pain, shortness of breath Appetite is good. Ambulating   Objective: Vital signs in last 24 hours: Temp:  [98.1 F (36.7 C)-99.4 F (37.4 C)] 98.5 F (36.9 C) (06/17 0800) Pulse Rate:  [43-101] 95 (06/17 0800) Cardiac Rhythm: Normal sinus rhythm (06/17 0800) Resp:  [10-27] 15 (06/17 0800) BP: (118-129)/(60-97) 118/60 (06/17 0800) SpO2:  [94 %-100 %] 94 % (06/17 0800)  Hemodynamic parameters for last 24 hours:    Intake/Output from previous day: 06/16 0701 - 06/17 0700 In: 2300 [I.V.:2300] Out: 3275 [Urine:3125; Chest Tube:150] Intake/Output this shift: Total I/O In: 100 [P.O.:100] Out: -   General appearance: alert, cooperative and no distress Neurologic: intact Heart: regular rate and rhythm Lungs: diminished breath sounds right base tidal variation with respiration, serosanguinous drainage from CT  Lab Results:  Recent Labs  08/04/16 0754 08/04/16 1405 08/06/16 0212  WBC 23.6*  --  21.5*  HGB 7.6* 8.5* 8.5*  HCT 23.3* 25.0* 26.1*  PLT 221  --  255   BMET:  Recent Labs  08/04/16 0754 08/04/16 1405 08/06/16 0212  NA 133* 135 135  K 4.4 5.6* 4.6  CL 105  --  109  CO2 21*  --  20*  GLUCOSE 112*  --  117*  BUN 17  --  19  CREATININE 1.56*  --  1.20  CALCIUM 8.2*  --  7.9*    PT/INR: No results for input(s): LABPROT, INR in the last 72 hours. ABG    Component Value Date/Time   PHART 7.234 (L) 08/04/2016 1405   HCO3 23.4 08/04/2016 1405   TCO2 25 08/04/2016 1405   ACIDBASEDEF 4.0 (H) 08/04/2016 1405   O2SAT 100.0 08/04/2016 1405   CBG (last 3)   Recent Labs  08/04/16 0738  GLUCAP 105*    Assessment/Plan: S/P Procedure(s) (LRB): REDO VIDEO ASSISTED THORACOSCOPY- EVACUATION OF HEMOTHORAX (Right) INSERTION VENA-CAVA FILTER (Right) -\  Drainage from  CT relatively modest.  CXR no significant change Dc posterior CT DVT/PE- IVC filter in place Creatinine has normalized Acute blood loss anemia- Hgb up slightly Leukocytosis- WBC trending down although still elevated   LOS: 14 days    Melrose Nakayama 08/06/2016

## 2016-08-07 ENCOUNTER — Encounter (HOSPITAL_COMMUNITY): Payer: Self-pay | Admitting: Thoracic Surgery (Cardiothoracic Vascular Surgery)

## 2016-08-07 ENCOUNTER — Inpatient Hospital Stay (HOSPITAL_COMMUNITY): Payer: Managed Care, Other (non HMO)

## 2016-08-07 LAB — CBC
HEMATOCRIT: 26.3 % — AB (ref 39.0–52.0)
Hemoglobin: 8.4 g/dL — ABNORMAL LOW (ref 13.0–17.0)
MCH: 28.7 pg (ref 26.0–34.0)
MCHC: 31.9 g/dL (ref 30.0–36.0)
MCV: 89.8 fL (ref 78.0–100.0)
PLATELETS: 273 10*3/uL (ref 150–400)
RBC: 2.93 MIL/uL — ABNORMAL LOW (ref 4.22–5.81)
RDW: 17.2 % — AB (ref 11.5–15.5)
WBC: 17.5 10*3/uL — AB (ref 4.0–10.5)

## 2016-08-07 NOTE — Progress Notes (Addendum)
[address]       [employer] [address]             [phone]      3 Days Post-Op Procedure(s) (LRB): REDO VIDEO ASSISTED THORACOSCOPY- EVACUATION OF HEMOTHORAX (Right) INSERTION VENA-CAVA FILTER (Right) Subjective: Feels pretty well, comfortable  Objective: Vital signs in last 24 hours: Temp:  [97.8 F (36.6 C)-99.1 F (37.3 C)] 97.8 F (36.6 C) (06/18 0404) Pulse Rate:  [83-87] 86 (06/18 0404) Cardiac Rhythm: Normal sinus rhythm (06/18 0739) Resp:  [19-22] 22 (06/18 0404) BP: (122-138)/(72-85) 136/80 (06/18 0404) SpO2:  [98 %] 98 % (06/18 0404)  Hemodynamic parameters for last 24 hours:    Intake/Output from previous day: 06/17 0701 - 06/18 0700 In: 2520 [P.O.:220; I.V.:2300] Out: 2210 [Urine:2060; Chest Tube:150] Intake/Output this shift: Total I/O In: -  Out: 400 [Urine:400]  General appearance: alert, cooperative and no distress Heart: regular rate and rhythm Lungs: clear to auscultation bilaterally Abdomen: benign Extremities: no edema Wound: incis healing well\  Lab Results:  Recent Labs  08/06/16 0212 08/07/16 0349  WBC 21.5* 17.5*  HGB 8.5* 8.4*  HCT 26.1* 26.3*  PLT 255 273   BMET:  Recent Labs  08/04/16 1405 08/06/16 0212  NA 135 135  K 5.6* 4.6  CL  --  109  CO2  --  20*  GLUCOSE  --  117*  BUN  --  19  CREATININE  --  1.20  CALCIUM  --  7.9*    PT/INR: No results for input(s): LABPROT, INR in the last 72 hours. ABG    Component Value Date/Time   PHART 7.234 (L) 08/04/2016 1405   HCO3 23.4 08/04/2016 1405   TCO2 25 08/04/2016 1405   ACIDBASEDEF 4.0 (H) 08/04/2016 1405   O2SAT 100.0 08/04/2016 1405   CBG (last 3)  No results for input(s): GLUCAP in the last 72 hours.  Meds Scheduled Meds: . amLODipine  10 mg Oral Daily  . benzonatate  100 mg Oral TID  . Chlorhexidine Gluconate Cloth  6 each Topical Daily  . chlorpheniramine-HYDROcodone  5 mL Oral Q12H  . ezetimibe  10 mg Oral Daily  . ferrous  sulfate  325 mg Oral TID WC  . mouth rinse  15 mL Mouth Rinse BID  . pantoprazole  40 mg Oral Daily  . polyethylene glycol  17 g Oral Daily  . rosuvastatin  40 mg Oral q1800  . senna-docusate  1 tablet Oral QHS  . sodium chloride flush  10-40 mL Intracatheter Q12H   Continuous Infusions: . dextrose 5 % and 0.9% NaCl 100 mL/hr at 08/07/16 0500  . potassium chloride     PRN Meds:.dextromethorphan-guaiFENesin, hydrALAZINE, ipratropium-albuterol, morphine injection, oxyCODONE, potassium chloride, sodium chloride flush, traMADol  Xrays Dg Chest Port 1 View  Result Date: 08/07/2016 CLINICAL DATA:  Right-sided pneumothorax with chest tube treatment. History of pulmonary embolism, CHF, COPD. EXAM: PORTABLE CHEST 1 VIEW COMPARISON:  Portable chest x-ray of August 06, 2016 FINDINGS: There remains volume loss on the right. The lower chest tube is been removed. The upper chest tube is in stable position with the tip overlying the posterolateral aspect of the fourth rib. No pneumothorax is evident. There remains pleural thickening along the lateral thoracic wall on the right. There is soft tissue air in the right axilla. There is no mediastinal shift. The left lung is clear. The heart is normal in size. The pulmonary vascularity is not engorged. The left  internal jugular venous catheter tip projects over the proximal portion of the SVC. The bony thorax exhibits no acute abnormality. IMPRESSION: No recurrent right pneumothorax since removal of 1 of the chest tubes. Improved appearance of the pulmonary interstitium on the right. Persistent pleural thickening along the right lateral thoracic wall. Electronically Signed   By: David  Martinique M.D.   On: 08/07/2016 07:32   Dg Chest Port 1 View  Result Date: 08/06/2016 CLINICAL DATA:  Pneumothorax on right EXAM: PORTABLE CHEST 1 VIEW COMPARISON:  08/05/2016 FINDINGS: Left IJ central line, right-sided chest tubes are in place as before and appear unchanged. Surgical  clips are noted at the right lung apex. No demonstrable pneumothorax. There is scarring/atelectasis in the lateral and inferior aspect of the right lung. Aeration has improved on the right. Left lung remains clear. IMPRESSION: Improved aeration on the right.  No pneumothorax. Electronically Signed   By: Nolon Nations M.D.   On: 08/06/2016 09:35    Assessment/Plan: S/P Procedure(s) (LRB): REDO VIDEO ASSISTED THORACOSCOPY- EVACUATION OF HEMOTHORAX (Right) INSERTION VENA-CAVA FILTER (Right)   1 doing well, hemodyn stable 2 H/H stable and leukocytosis improving 3 150 cc serosang drainage , no air leak- keep CT for now 4 has IVC filter   LOS: 15 days    Charles [name],Charles [name] 08/07/2016 Patient seen and examined, agree with above No air leak- will dc chest tube  Remo Lipps C. Roxan Hockey, MD [facility] [phone]

## 2016-08-08 ENCOUNTER — Inpatient Hospital Stay (HOSPITAL_COMMUNITY): Payer: Managed Care, Other (non HMO)

## 2016-08-08 MED ORDER — OXYCODONE HCL 5 MG PO TABS: 5.0000 mg | ORAL_TABLET | ORAL | 0 refills | Status: DC | PRN

## 2016-08-08 MED ORDER — FERROUS SULFATE 325 (65 FE) MG PO TABS: 325.0000 mg | ORAL_TABLET | Freq: Three times a day (TID) | ORAL | 0 refills | Status: DC

## 2016-08-08 MED ORDER — BENZONATATE 100 MG PO CAPS: 100.0000 mg | ORAL_CAPSULE | Freq: Three times a day (TID) | ORAL | 0 refills | Status: DC

## 2016-08-08 NOTE — Discharge Summary (Signed)
Physician Discharge Summary  Patient ID: Charles [name] MRN: [id number] DOB/AGE: 31-Mar-1950 66 y.o.  Admit date: 07/23/2016 Discharge date: 08/08/2016  Admission Diagnoses: Pulmonary embolism  Discharge Diagnoses:  Principal Problem:   Acute pulmonary embolism ([place]) Active Problems:   Hyperlipidemia   Essential hypertension   COPD (chronic obstructive pulmonary disease) (H[facility])   Coronary artery calcification seen on CT scan   Pneumothorax on right   PE (pulmonary thromboembolism) (H[facility])   Acute renal failure superimposed on stage 3 chronic kidney disease ([place])   Acute DVT (deep venous thrombosis) ([place])   Lung blebs ([place])  Patient Active Problem List   Diagnosis Date Noted  . Lung blebs ([place]) 07/31/2016  . Acute DVT (deep venous thrombosis) ([place]) 07/25/2016  . Pneumothorax on right 07/23/2016  . PE (pulmonary thromboembolism) ([place]) 07/23/2016  . Acute renal failure superimposed on stage 3 chronic kidney disease ([place]) 07/23/2016  . Gross hematuria 04/25/2016  . UTI (urinary tract infection) 08/26/2015  . Bilateral hearing loss 08/26/2015  . Coronary artery calcification seen on CT scan 06/01/2015  . Low back pain 06/01/2015  . Hyperglycemia 12/18/2014  . Anemia 05/26/2014  . Cerebellar stroke ([place]) 05/26/2014  . Brain contusion ([place]) 04/23/2014  . Lung nodule 03/09/2014  . Acute pulmonary embolism ([place]) 02/18/2014  . Multiple nodules of lung 02/18/2014  . Cough 12/24/2013  . Allergic rhinitis 12/24/2013  . Other chest pain 12/24/2013  . Increased prostate specific antigen (PSA) velocity 11/20/2013  . Eustachian tube dysfunction 11/20/2013  . Hearing loss on right 11/20/2013  . Serous otitis media 11/15/2013  . Muscle tightness 10/02/2013  . Rotator cuff injury 09/17/2013  . Skin lesion 07/25/2013  . Anal pain 12/18/2012  . Habitual alcohol use 11/17/2012  . Cellulitis and abscess of buttock 11/15/2012  . Cluster headaches 06/07/2012  . BPH (benign prostatic  hyperplasia) 11/22/2010  . Abnormal LFTs 11/14/2010  . Right inguinal hernia 09/16/2010  . Nocturia 08/29/2010  . Encounter for preventative adult health care exam with abnormal findings 08/28/2010  . FREQUENCY, URINARY 12/24/2009  . COPD (chronic obstructive pulmonary disease) ([place]) 05/12/2009  . ABNORMAL ELECTROCARDIOGRAM 11/02/2008  . Solitary pulmonary nodule 05/14/2008  . Cramp of limb 07/19/2007  . DIVERTICULOSIS, COLON 03/07/2007  . Hyperlipidemia 10/23/2006  . 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/23/2006  . Essential hypertension 10/23/2006  . Unspecified Peripheral Vascular Disease 10/23/2006  . HEMORRHOIDS 10/23/2006  . ABSCESS, LUNG 10/23/2006  . GERD 10/23/2006   HPI: Charles [name] is a 66 y.o. male with medical history significant of hypertension, hyperlipidemia, COPD, stroke, PE 2016 and DVT 2015 not on anticoagulants, PVD, CAD, BPH, chronic kidney disease-stage III, who presents with shortness breath.  Patient states that he had history of DVT and PE which were treated with Xarelto. As instructed by PCP, he stoped taking Xarelto 8 months ago. He states that that he developed SOB, which has been progressively getting worse. He does not have chest pain, fever or chills. No calf tenderness. He has mild cough with clear mucus production. Patient denies nausea, vomiting, diarrhea, abdominal pain, symptoms of UTI. No leg edema. Pt was seen in ED of [facility]. CT angiogram of the chest showed bilateral segmental and subsegmental pulmonary embolism, also showed large partially loculated right pneumothorax. Right sided chest tube was placed in ED. Pt states that his shortness breath has improved after chest tube is placed. He speaks in full sentence. Chest x-ray showed partial resolution of right humeral thorax after chest tube is placed, no infiltration. IV heparin was  started in ED.  ED Course: pt was found to have  WBC 7.8, worsening renal function, chest x-ray. At arrival to floor,  blood  pressure 159/93, tachycardia, tachypnea, oxygen saturation 100% on 2 L nasal cannula oxygen, temperature 90.9. Patient is admitted to stepdown as inpatient.   Discharged Condition: good  Hospital Course: The patient was admitted through the emergency department and he was started on a course of treatment for his acute pulmonary embolism. This included initiation of heparin. A chest tube was also placed due to a pneumothorax. Additionally, upon admission he was noted to have acute renal insufficiency with a creatinine of 1.65 compared to his baseline in the 1.1 range. This was felt to be primarily secondary to dehydration in association with Ace inhibitors and nonsteroidal anti-inflammatories. These were stopped. A cardiothoracic surgical consultation was obtained as well as Dr. Roxan Hockey. Initially there was no difficulty with bleeding associated with the chest tube, however ultimately he did develope some evidence of hemopneumothorax. He was monitored clinically but ultimately was determined that he would require surgical exploration and drainage. This was done on 07/31/2016 and is described below. Initially after this procedure the heparin was on hold but ultimately restarted and he again re-bled requiring another surgical exploration. At that time due to his intolerance of anticoagulation therapy and inferior vena cava filter was placed by Dr. early with vascular surgery. He's been monitored closely and chest tube has been discontinued. There is no evidence of bleeding in chest x-ray appearance is slowly improving. At time of discharge she is felt to be quite stable from a clinical viewpoint.  Consults: vascular surgery and internal medicine  Significant Diagnostic Studies: Chest CT  Treatments: surgery:    OPERATIVE REPORT  DATE OF SURGERY: 08/04/2016  PATIENT: Charles [name], 66 y.o. male MRN: [id number]  DOB: Feb 11, 1951  PRE-OPERATIVE DIAGNOSIS: Pulmonary embolus with  contraindication to anticoagulation  POST-OPERATIVE DIAGNOSIS:  Same  PROCEDURE: Cook Celect vena cava filter placement   SURGEON:  Curt Jews, M.D.  PHYSICIAN ASSISTANT:  nurse  ANESTHESIA:   Gen.                                OPERATIVE REPORT   PREOPERATIVE DIAGNOSIS:  Right hemothorax.  POSTOPERATIVE DIAGNOSIS:  Right hemothorax.  PROCEDURE:  Redo right video-assisted thoracoscopy, evacuation of hemothorax.  SURGEON:  Revonda Standard. Roxan Hockey, M.D.  ASSISTANTEllwood Handler, PA.  ANESTHESIA:  General.  FINDINGS:  Large clotted hemothorax, approximately 1 L of blood and clot evacuated.   PHYSICIAN:  Revonda Standard. Roxan Hockey, M.D. DATE OF BIRTH:  DATE OF PROCEDURE:  07/31/2016 DATE OF DISCHARGE:                              OPERATIVE REPORT   PREOPERATIVE DIAGNOSIS:  Right spontaneous pneumothorax.  POSTOPERATIVE DIAGNOSIS:  Right spontaneous pneumothorax.  PROCEDURE:  Right video-assisted thoracoscopy, takedown of adhesions, apical blebectomy.  SURGEON:  Revonda Standard. Roxan Hockey, M.D.  ASSISTANT:  Jadene Pierini, P.A.-C.  ANESTHESIA:  General.  FINDINGS:  Adhesions of upper lobe and middle lobe to the chest wall, severe blebs at the apex, previous chest tube occluded with clotted blood.  Discharge Exam: Blood pressure 116/75, pulse 99, temperature 98.6 F (37 C), temperature source Oral, resp. rate 19, height 6\' 4"  (1.93 m), weight 175 lb (79.4 kg), SpO2 100 %.   General appearance: alert, cooperative and  no distress Heart: regular rate and rhythm Lungs: clear to auscultation bilaterally Abdomen: benign Extremities: no edema Wound: incis healing well  Disposition: 01-Home or Self Care   Dg Chest 2 View  Result Date: 08/08/2016 CLINICAL DATA:  Shortness of breath. EXAM: CHEST  2 VIEW COMPARISON:  August 07, 2016 FINDINGS: There are small air-fluid levels in the right apex, possibly small loculated areas of pneumothorax. No tension  component. There is atelectatic change in the right mid lower lung zones with patchy infiltrate in the lateral right base. There is a small right pleural effusion. On the left, there is mild atelectatic change in the base. Left lung otherwise clear. Heart size and pulmonary vascularity are normal. No adenopathy. Central catheter tip is in the superior vena cava, stable. Known adenopathy evident. There is aortic atherosclerosis. No bone lesions. IMPRESSION: Probable small areas of loculated pneumothorax in the right apex region. Areas of atelectasis on the right with right pleural effusion and small area of consolidation in the lateral right base, likely a small area of pneumonia. Mild left base atelectasis. Stable cardiac silhouette. There is aortic atherosclerosis. Electronically Signed   By: Lowella Grip III M.D.   On: 08/08/2016 08:02   Dg Chest 1v Repeat Same Day  Result Date: 08/07/2016 CLINICAL DATA:  Status post right-sided chest tube removal. EXAM: CHEST - 1 VIEW SAME DAY COMPARISON:  Radiograph of same day. FINDINGS: Stable cardiomediastinal silhouette. Left lung is clear. Left internal jugular catheter is stable with distal tip in expected position of the SVC. No pneumothorax is seen following chest tube removal. Postsurgical changes are seen in the right lung and chest. Mild right basilar opacity is noted concerning for atelectasis or scarring. Small amount of subcutaneous emphysema is seen over right lateral chest wall which is unchanged. IMPRESSION: No pneumothorax status post right-sided chest tube removal. Postsurgical changes are noted in the right lung and chest. Electronically Signed   By: Marijo Conception, M.D.   On: 08/07/2016 16:29   Dg Chest Port 1 View  Result Date: 08/07/2016 CLINICAL DATA:  Right-sided pneumothorax with chest tube treatment. History of pulmonary embolism, CHF, COPD. EXAM: PORTABLE CHEST 1 VIEW COMPARISON:  Portable chest x-ray of August 06, 2016 FINDINGS: There  remains volume loss on the right. The lower chest tube is been removed. The upper chest tube is in stable position with the tip overlying the posterolateral aspect of the fourth rib. No pneumothorax is evident. There remains pleural thickening along the lateral thoracic wall on the right. There is soft tissue air in the right axilla. There is no mediastinal shift. The left lung is clear. The heart is normal in size. The pulmonary vascularity is not engorged. The left internal jugular venous catheter tip projects over the proximal portion of the SVC. The bony thorax exhibits no acute abnormality. IMPRESSION: No recurrent right pneumothorax since removal of 1 of the chest tubes. Improved appearance of the pulmonary interstitium on the right. Persistent pleural thickening along the right lateral thoracic wall. Electronically Signed   By: David  Martinique M.D.   On: 08/07/2016 07:32   Dg Chest Port 1 View  Result Date: 08/06/2016 CLINICAL DATA:  Pneumothorax on right EXAM: PORTABLE CHEST 1 VIEW COMPARISON:  08/05/2016 FINDINGS: Left IJ central line, right-sided chest tubes are in place as before and appear unchanged. Surgical clips are noted at the right lung apex. No demonstrable pneumothorax. There is scarring/atelectasis in the lateral and inferior aspect of the right lung. Aeration has improved  on the right. Left lung remains clear. IMPRESSION: Improved aeration on the right.  No pneumothorax. Electronically Signed   By: Nolon Nations M.D.   On: 08/06/2016 09:35   Dg Chest Port 1 View  Result Date: 08/05/2016 CLINICAL DATA:  S/P right lung surgery, follow-up exam EXAM: PORTABLE CHEST 1 VIEW COMPARISON:  08/04/2016 FINDINGS: The patient has dual right-sided chest tubes. No evidence for pneumothorax. Postoperative changes in the right hemithorax and a small amount of subcutaneous gas. The left lung remains clear. Left IJ central line tip overlies the level of the superior vena cava and appears unchanged.  IMPRESSION: 1. Postoperative changes in the right lung. 2. No pneumothorax. Electronically Signed   By: Nolon Nations M.D.   On: 08/05/2016 09:26   Dg Chest Port 1 View  Result Date: 08/04/2016 CLINICAL DATA:  Follow-up examination status post thoracotomy. EXAM: PORTABLE CHEST 1 VIEW COMPARISON:  Prior radiograph from earlier the same day. FINDINGS: Cardiac and mediastinal silhouettes are stable in size and contour, and remain within normal limits. Left IJ approach centra venous catheter has been place with tip overlying the cavoatrial junction. There has been interval placement of a right-sided chest tube, with tip overlying the right hilum. Additional chest tube projects over the lateral right upper lobe. Previously seen large pleural based opacity is markedly reduced, likely reflecting decreased effusion. A small residual effusion persist. Improved expansion of the right lung with scattered residual opacity throughout the lung itself. Left lung remains clear. No pneumothorax. Skin staples overlie the lateral right chest wall with underlying soft tissue emphysema. Osseous structures unchanged. IMPRESSION: 1. Interval placement of second right-sided chest tube with marked did interval improvement of pleural-based opacity, likely reflecting decrease diffusion. Small residual right pleural effusion remains. Secondary improved aeration of the right lung. No appreciable pneumothorax. 2. Left IJ approach central venous catheter in place with tip overlying the cavoatrial junction. Electronically Signed   By: Jeannine Boga M.D.   On: 08/04/2016 15:45   Dg Abd 2 Views  Result Date: 08/04/2016 CLINICAL DATA:  Intraoperative fluoroscopic images from IVC filter placement. EXAM: DG C-ARM 61-120 MIN; ABDOMEN - 2 VIEW COMPARISON:  None. FINDINGS: Fluoroscopic images demonstrate inferior vena cava filter overlying the spine. Surgical or instrument projects to the right of the image. Fluoroscopy time is reported  as 40 seconds. IMPRESSION: Intraoperative fluoroscopic images from IVC filter placement. Electronically Signed   By: Fidela Salisbury M.D.   On: 08/04/2016 20:21   Dg C-arm 1-60 Min  Result Date: 08/04/2016 CLINICAL DATA:  Intraoperative fluoroscopic images from IVC filter placement. EXAM: DG C-ARM 61-120 MIN; ABDOMEN - 2 VIEW COMPARISON:  None. FINDINGS: Fluoroscopic images demonstrate inferior vena cava filter overlying the spine. Surgical or instrument projects to the right of the image. Fluoroscopy time is reported as 40 seconds. IMPRESSION: Intraoperative fluoroscopic images from IVC filter placement. Electronically Signed   By: Fidela Salisbury M.D.   On: 08/04/2016 20:21      Allergies as of 08/08/2016      Reactions   No Known Allergies       Medication List    STOP taking these medications   ALEVE 220 MG tablet Generic drug:  naproxen sodium   aspirin 325 MG tablet   lisinopril 20 MG tablet Commonly known as:  PRINIVIL,ZESTRIL     TAKE these medications   amLODipine 10 MG tablet Commonly known as:  NORVASC TAKE ONE TABLET BY MOUTH ONCE DAILY   benzonatate 100 MG capsule Commonly  known as:  TESSALON Take 1 capsule (100 mg total) by mouth 3 (three) times daily.   ezetimibe 10 MG tablet Commonly known as:  ZETIA Take 1 tablet (10 mg total) by mouth daily.   ferrous sulfate 325 (65 FE) MG tablet Take 1 tablet (325 mg total) by mouth 3 (three) times daily with meals.   oxyCODONE 5 MG immediate release tablet Commonly known as:  Oxy IR/ROXICODONE Take 1-2 tablets (5-10 mg total) by mouth every 4 (four) hours as needed for severe pain.   rosuvastatin 40 MG tablet Commonly known as:  CRESTOR Take 1 tablet (40 mg total) by mouth daily.   tadalafil 20 MG tablet Commonly known as:  CIALIS Take 1 tablet (20 mg total) by mouth daily as needed for erectile dysfunction.            Durable Medical Equipment        Start     Ordered   08/08/16 1152  For  home use only DME Walker rolling  Once    Question:  Patient needs a walker to treat with the following condition  Answer:  Physical deconditioning   08/08/16 1151     Follow-up Information    Melrose Nakayama, MD Follow up on 08/22/2016.   Specialty:  Cardiothoracic Surgery Why:  PA/LAT CXR to be taken (at [facility] which is in the same building as Dr. Leonarda Salon office on 08/22/2016 at 2:30 pm;Appointment time is at 3:00 pm Contact information: [place] [address] [phone]        Rosetta Posner, MD Follow up in 3 week(s).   Specialties:  Vascular Surgery, Cardiology Why:  Office will call you to arrange your appt (sent) Contact information: [place] [address] [phone]           Signed: John Giovanni 08/08/2016, 12:42 PM

## 2016-08-08 NOTE — Progress Notes (Signed)
      [address]       [place] [address]             [phone]      No complaints, ready to go home  BP 116/75 (BP Location: Right Arm)   Pulse 99   Temp 98.6 F (37 C) (Oral)   Resp 19   Ht 6\' 4"  (1.93 m)   Wt 175 lb (79.4 kg)   SpO2 100%   BMI 21.30 kg/m   CXR reviewed, has a couple of small air pockets unchanged. No penumothorax  Lungs clear anteriorly, decreased at right base  Doing well  Looks great   Will dc home today  Follow up in 2-3 weeks to decide timing of IVC filter removal and restarting anticoagulation  Remo Lipps C. Roxan Hockey, MD [facility] [phone]

## 2016-08-14 ENCOUNTER — Telehealth: Payer: Self-pay | Admitting: Internal Medicine

## 2016-08-14 NOTE — Telephone Encounter (Signed)
Charles [name] - a RN from [facility] called to give Dr Jenny Reichmann her contact information incase we ever need to contact her. She is the registered nurse case manager for this pt.  She also wanted to let us know that the pt was recently released from the hospital for issues with a pneumothorax. She has been trying to contact the pt to do a routine hospital follow up through [facility] with him as well. I left a message with the pt to see for him to schedule a hospital follow up with Dr Jenny Reichmann.

## 2016-08-16 ENCOUNTER — Other Ambulatory Visit: Payer: Self-pay | Admitting: Internal Medicine

## 2016-08-22 ENCOUNTER — Ambulatory Visit
Admission: RE | Admit: 2016-08-22 | Discharge: 2016-08-22 | Disposition: A | Discharge status: Home / Self Care | Payer: Managed Care, Other (non HMO) | Source: Ambulatory Visit | Attending: Thoracic Surgery (Cardiothoracic Vascular Surgery) | Admitting: Thoracic Surgery (Cardiothoracic Vascular Surgery)

## 2016-08-22 ENCOUNTER — Encounter: Payer: Self-pay | Admitting: Thoracic Surgery (Cardiothoracic Vascular Surgery)

## 2016-08-22 ENCOUNTER — Telehealth: Payer: Self-pay | Admitting: Vascular Surgery

## 2016-08-22 ENCOUNTER — Other Ambulatory Visit: Payer: Self-pay | Admitting: Thoracic Surgery (Cardiothoracic Vascular Surgery)

## 2016-08-22 ENCOUNTER — Ambulatory Visit (INDEPENDENT_AMBULATORY_CARE_PROVIDER_SITE_OTHER): Payer: Self-pay | Admitting: Thoracic Surgery (Cardiothoracic Vascular Surgery)

## 2016-08-22 VITALS — BP 124/82 | HR 76 | Resp 20 | Ht 76.0 in | Wt 168.0 lb

## 2016-08-22 DIAGNOSIS — J942 Hemothorax: Secondary | ICD-10-CM

## 2016-08-22 DIAGNOSIS — J939 Pneumothorax, unspecified: Secondary | ICD-10-CM

## 2016-08-22 DIAGNOSIS — J439 Emphysema, unspecified: Secondary | ICD-10-CM

## 2016-08-22 NOTE — Telephone Encounter (Signed)
-----   Message from Mena Goes, RN sent at 08/05/2016 10:19 PM EDT ----- Regarding: 3 weeks   ----- Message ----- From: Gabriel Earing, PA-C Sent: 08/05/2016   9:20 AM To: [facility] Charge Pool  S/p IVC filter.  Needs to f/u with Dr. Donnetta Hutching in 3 weeks to discuss removing filter.  Thanks

## 2016-08-22 NOTE — Telephone Encounter (Signed)
Scched appt 09/19/16 at 11:15. Spoke to pt.

## 2016-08-22 NOTE — Progress Notes (Signed)
[address]       [place] [address]             [phone]    HPI: Charles [name] returns for a scheduled postoperative follow-up visit  Charles [name] is a 66 year old man who was admitted to the hospital in early June after presenting with chest pain and shortness of breath. He had bilateral pulmonary emboli documented by CT and also had a large [name] spontaneous pneumothorax. A pleural catheter was placed in the emergency room, but later became dislodged. He had significant bleeding around the pleural catheter as he was on full dose anticoagulation. I placed a chest tube. That tube became clogged with blood and he developed a progressive pneumothorax despite not having an air leak. On 07/31/2016 I did a [name] VATS and apical blebectomy. He had significant adhesions in the chest which accounted for his loculated pneumothorax. We waited 48 hours before restarting anticoagulation, but despite that he developed a large hemothorax. He was taken back to the operating room on 08/04/2016 for a redo [name] VATS for evacuation of the hemothorax. Dr. Donnetta Hutching placed an IVC filter at the same time. We were concerned of the chest tube management postoperatively but he was discharged on 08/08/2016.  Since discharge he has had problems with cough. He is taking Tessalon for that. He ran out of oxycodone and has been using Tylenol for pain. Pain control is adequate with that. He would like to use Aleve but will be going back on blood thinners.   Past Medical History:  Diagnosis Date  . ABNORMAL ELECTROCARDIOGRAM 11/02/2008  . ABSCESS, LUNG 10/23/2006  . Anemia 05/26/2014  . BACTERIAL PNEUMONIA 12/28/2009  . BPH (benign prostatic hyperplasia) 11/22/2010  . 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 CHEST PAIN 12/24/2009  . Coronary artery calcification seen on CT scan 06/01/2015  . Cramp of limb 07/19/2007  . DIVERTICULOSIS, COLON 03/07/2007  . DVT (deep venous thrombosis) ([place]) 01/2014   LLE  . Emphysema  of lung ([place])   . 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/23/2006  . FLANK PAIN, LEFT 02/25/2010  . FREQUENCY, URINARY 12/24/2009  . HEMORRHOIDS 10/23/2006  . HYPERLIPIDEMIA 10/23/2006  . HYPERTENSION 10/23/2006  . PE (pulmonary embolism) 02/2014  . PLANTAR FASCIITIS, LEFT 11/02/2008  . Pneumonia 12/2013 X 2  . PSA, INCREASED 11/20/2007  . PULMONARY NODULE 05/14/2008  . Unspecified Peripheral Vascular Disease 10/23/2006     Current Outpatient Prescriptions  Medication Sig Dispense Refill  . amLODipine (NORVASC) 10 MG tablet TAKE ONE TABLET BY MOUTH ONCE DAILY 90 tablet 0  . benzonatate (TESSALON) 100 MG capsule Take 1 capsule (100 mg total) by mouth 3 (three) times daily. 20 capsule 0  . ezetimibe (ZETIA) 10 MG tablet Take 1 tablet (10 mg total) by mouth daily. 90 tablet 1  . rosuvastatin (CRESTOR) 40 MG tablet TAKE 1 TABLET DAILY 90 tablet 1  . tadalafil (CIALIS) 20 MG tablet Take 1 tablet (20 mg total) by mouth daily as needed for erectile dysfunction. 10 tablet 11   No current facility-administered medications for this visit.     Physical Exam BP 124/82   Pulse 76   Resp 20   Ht 6\' 4"  (1.93 m)   Wt 168 lb (76.2 kg)   SpO2 98% Comment: RA  BMI 20.10 kg/m  66 year old man in no acute distress Alert and oriented 3 with no focal deficits Lungs diminished the [name] base, otherwise clear Incisions healing well No peripheral edema  Diagnostic  Tests: CHEST  2 VIEW  COMPARISON:  08/08/2016  FINDINGS: The heart size and mediastinal contours are within normal limits. Aortic atherosclerosis.  Skin staples again seen in the [name] chest wall. No residual pneumothorax seen on today's study. Small [name] pleural effusion again noted. Decreased atelectasis seen in [name] mid and lower lung. Left lung remains clear.  IMPRESSION: No residual pneumothorax visualized. Persistent small [name] pleural effusion.  Decreased [name] mid and lower lung atelectasis.   Electronically Signed   By: Earle Gell  M.D.   On: 08/22/2016 14:48 I personally reviewed the chest x-ray and concur with the findings noted above  Impression: Charles [name] is a 66 year old gentleman who presented with a [name] spontaneous pneumothorax and bilateral pulmonary emboli back in early June. He had problems with bleeding with chest tubes in place and required surgical intervention for progressive pneumothorax. He had severe bleb disease and significant adhesions in the chest. He bled into the chest postoperatively when anticoagulation was restarted. He had to be taken back the operating room to evacuate hemothorax and Dr. Donnetta Hutching placed an IVC filter at that time. His air leak ultimately resolved and he was discharged home without anticoagulation, with a plan to resume anticoagulation after safe interval.  From a surgical standpoint he's doing well. He does still have some discomfort but is able to manage that with Tylenol.  Pulmonary emboli- he has an IVC filter in place. I would like to restart his anticoagulation prior to removal of the IVC filter just in case there is any issues with bleeding into the chest. He had been on Xarelto previously, so we will plan to use that medication again. Friday will be a full 3 weeks from his surgery so think we would be safe to start his anticoagulation on Monday, July 9. I will place a prescription for Xarelto with his pharmacy.   There are no restrictions on his activities at this point although he was cautioned to build into new activities gradually.  Plan: Start Xarelto on Monday, July 9 Follow-up with Dr. Donnetta Hutching as scheduled I will see him back in a month with PA and lateral chest x-ray to check on his progress.  He knows to call if he has any issues after restarting anticoagulation.  Melrose Nakayama, MD [facility] [phone]

## 2016-08-22 NOTE — Patient Instructions (Signed)
Start Xarelto on July 9th

## 2016-08-24 ENCOUNTER — Ambulatory Visit (INDEPENDENT_AMBULATORY_CARE_PROVIDER_SITE_OTHER): Payer: Managed Care, Other (non HMO) | Admitting: Internal Medicine

## 2016-08-24 ENCOUNTER — Encounter: Payer: Self-pay | Admitting: Internal Medicine

## 2016-08-24 VITALS — BP 138/88 | HR 79 | Ht 76.0 in | Wt 168.0 lb

## 2016-08-24 DIAGNOSIS — N400 Enlarged prostate without lower urinary tract symptoms: Secondary | ICD-10-CM | POA: Diagnosis not present

## 2016-08-24 DIAGNOSIS — I2699 Other pulmonary embolism without acute cor pulmonale: Secondary | ICD-10-CM

## 2016-08-24 DIAGNOSIS — H9203 Otalgia, bilateral: Secondary | ICD-10-CM | POA: Diagnosis not present

## 2016-08-24 DIAGNOSIS — H9193 Unspecified hearing loss, bilateral: Secondary | ICD-10-CM

## 2016-08-24 DIAGNOSIS — D649 Anemia, unspecified: Secondary | ICD-10-CM | POA: Diagnosis not present

## 2016-08-24 MED ORDER — TADALAFIL 5 MG PO TABS: 5.0000 mg | ORAL_TABLET | Freq: Every day | ORAL | 11 refills | Status: DC

## 2016-08-24 NOTE — Progress Notes (Signed)
Subjective:    Patient ID: Charles [name], male    DOB: 1950/04/13, 66 y.o.   MRN: [id number]  HPI  Here to f/u recent hospn 6/3 to 3/61 with PE complicated by difficult to tx right hemopneumothorax.  Was initially placed on heparin, but with above, was stopped, IVC filter placed, with plan for xarelto restart on July 9.  Had total 3 Chest tubes but the last now removed and stiches out earlier today.  Course complicated by anemia s/p 1 u PRBC,  Last Hgb stable  Lab Results  Component Value Date   WBC 17.5 (H) 08/07/2016   HGB 8.4 (L) 08/07/2016   HCT 26.3 (L) 08/07/2016   MCV 89.8 08/07/2016   PLT 273 08/07/2016  No overt bleeding off anticoagulant.  Pt also does have BPH symptoms and remarks his cialis 20 mg really helped with urinary flow and retention symptoms, asks to try cialis 5 mg daily.  Also still with 1 yr bilat ear fullness and discomfort, has seen ENT locally, then ENT at [facility], but same tx each time did not help. Asks for repeat ENT eval for persistent symptoms. Past Medical History:  Diagnosis Date  . ABNORMAL ELECTROCARDIOGRAM 11/02/2008  . ABSCESS, LUNG 10/23/2006  . Anemia 05/26/2014  . BACTERIAL PNEUMONIA 12/28/2009  . BPH (benign prostatic hyperplasia) 11/22/2010  . 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 CHEST PAIN 12/24/2009  . Coronary artery calcification seen on CT scan 06/01/2015  . Cramp of limb 07/19/2007  . DIVERTICULOSIS, COLON 03/07/2007  . DVT (deep venous thrombosis) ([place]) 01/2014   LLE  . Emphysema of lung ([place])   . 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/23/2006  . FLANK PAIN, LEFT 02/25/2010  . FREQUENCY, URINARY 12/24/2009  . HEMORRHOIDS 10/23/2006  . HYPERLIPIDEMIA 10/23/2006  . HYPERTENSION 10/23/2006  . PE (pulmonary embolism) 02/2014  . PLANTAR FASCIITIS, LEFT 11/02/2008  . Pneumonia 12/2013 X 2  . PSA, INCREASED 11/20/2007  . PULMONARY NODULE 05/14/2008  . Unspecified Peripheral Vascular Disease 10/23/2006   Past Surgical History:  Procedure Laterality Date  . HEMORRHOID  SURGERY  ?1990's  . INGUINAL HERNIA REPAIR Bilateral ?2000's  . SHOULDER ARTHROSCOPY W/ ROTATOR CUFF REPAIR Right 11/2013  . STAPLING OF BLEBS Right 07/31/2016   Procedure: BLEBECTOMY;  Surgeon: Melrose Nakayama, MD;  Location: [facility];  Service: Thoracic;  Laterality: Right;  . VENA CAVA FILTER PLACEMENT Right 08/04/2016   Procedure: INSERTION VENA-CAVA FILTER;  Surgeon: Melrose Nakayama, MD;  Location: [place];  Service: Thoracic;  Laterality: Right;  Marland Kitchen VIDEO ASSISTED THORACOSCOPY Right 07/31/2016   Procedure: VIDEO ASSISTED THORACOSCOPY;  Surgeon: Melrose Nakayama, MD;  Location: [place];  Service: Thoracic;  Laterality: Right;  Marland Kitchen VIDEO ASSISTED THORACOSCOPY Right 08/04/2016   Procedure: REDO VIDEO ASSISTED THORACOSCOPY- EVACUATION OF HEMOTHORAX;  Surgeon: Melrose Nakayama, MD;  Location: [place];  Service: Thoracic;  Laterality: Right;  Marland Kitchen VIDEO BRONCHOSCOPY WITH ENDOBRONCHIAL NAVIGATION N/A 03/11/2014   Procedure: VIDEO BRONCHOSCOPY WITH ENDOBRONCHIAL NAVIGATION;  Surgeon: Collene Gobble, MD;  Location: [place];  Service: Thoracic;  Laterality: N/A;    reports that he has quit smoking. His smoking use included Cigarettes. He has a 15.00 pack-year smoking history. He has never used smokeless tobacco. He reports that he uses drugs, including Marijuana. He reports that he does not drink alcohol. family history includes Heart disease in his father and mother; Kidney disease in his sister. Allergies  Allergen Reactions  . No Known Allergies    Current Outpatient Prescriptions  on File Prior to Visit  Medication Sig Dispense Refill  . amLODipine (NORVASC) 10 MG tablet TAKE ONE TABLET BY MOUTH ONCE DAILY 90 tablet 0  . ezetimibe (ZETIA) 10 MG tablet Take 1 tablet (10 mg total) by mouth daily. 90 tablet 1  . rosuvastatin (CRESTOR) 40 MG tablet TAKE 1 TABLET DAILY 90 tablet 1   No current facility-administered medications on file prior to visit.    Review of Systems  Constitutional:  Negative for other unusual diaphoresis or sweats HENT: Negative for ear discharge or swelling Eyes: Negative for other worsening visual disturbances Respiratory: Negative for stridor or other swelling  Gastrointestinal: Negative for worsening distension or other blood Genitourinary: Negative for retention or other urinary change Musculoskeletal: Negative for other MSK pain or swelling Skin: Negative for color change or other new lesions Neurological: Negative for worsening tremors and other numbness  Psychiatric/Behavioral: Negative for worsening agitation or other fatigue All other system neg per pt    Objective:   Physical Exam BP 138/88   Pulse 79   Ht 6\' 4"  (1.93 m)   Wt 168 lb (76.2 kg)   SpO2 99%   BMI 20.45 kg/m  VS noted,  Constitutional: Pt appears in NAD HENT: Head: NCAT.  Right Ear: External ear normal.  Left Ear: External ear normal.  Bilat tm's with mild erythema.  Max sinus areas non tender.  Pharynx with mild erythema, no exudate Eyes: . Pupils are equal, round, and reactive to light. Conjunctivae and EOM are normal Nose: without d/c or deformity Neck: Neck supple. Gross normal ROM Cardiovascular: Normal rate and regular rhythm.   Pulmonary/Chest: Effort normal and breath sounds without rales or wheezing.  Neurological: Pt is alert. At baseline orientation, motor grossly intact Skin: Skin is warm. No rashes, other new lesions, no LE edema Psychiatric: Pt behavior is normal without agitation  No other exam findings  Lab Results  Component Value Date   WBC 17.5 (H) 08/07/2016   HGB 8.4 (L) 08/07/2016   HCT 26.3 (L) 08/07/2016   PLT 273 08/07/2016   GLUCOSE 117 (H) 08/06/2016   CHOL 184 12/16/2015   TRIG 150.0 (H) 12/16/2015   HDL 38.90 (L) 12/16/2015   LDLDIRECT 80.8 11/18/2013   LDLCALC 115 (H) 12/16/2015   ALT 13 (L) 08/02/2016   AST 20 08/02/2016   NA 135 08/06/2016   K 4.6 08/06/2016   CL 109 08/06/2016   CREATININE 1.20 08/06/2016   BUN 19  08/06/2016   CO2 20 (L) 08/06/2016   TSH 1.84 08/25/2015   PSA 1.54 08/25/2015   INR 0.98 07/31/2016   HGBA1C 4.6 12/16/2015   MICROALBUR 1.5 08/25/2015       Assessment & Plan:

## 2016-08-24 NOTE — Assessment & Plan Note (Signed)
No overt bleeding, not currently on anticoagulant, declines f/u lab today

## 2016-08-24 NOTE — Assessment & Plan Note (Signed)
S/p IVC filter, for xarelto restart July 9, needs to take long term, to f/u any worsening symptoms or concerns

## 2016-08-24 NOTE — Assessment & Plan Note (Signed)
Etiology not clear, for ENT referral locally as per pt request

## 2016-08-24 NOTE — Assessment & Plan Note (Signed)
Ok for trial cialis 5 mg if ok with insurance

## 2016-08-24 NOTE — Patient Instructions (Addendum)
Ok to change the cialis 20 mg to 5 mg daily  You will be contacted regarding the referral for: ENT  Please continue all other medications as before, including restart of the xarelto July 9  Please have the pharmacy call with any other refills you may need.  Please continue your efforts at being more active, low cholesterol diet, and weight control.  You are otherwise up to date with prevention measures today.  Please keep your appointments with your specialists as you may have planned  Please return in nov 1, or sooner if needed

## 2016-08-29 ENCOUNTER — Telehealth: Payer: Self-pay | Admitting: Internal Medicine

## 2016-08-29 MED ORDER — EZETIMIBE 10 MG PO TABS: 10.0000 mg | ORAL_TABLET | Freq: Every day | ORAL | 1 refills | Status: DC

## 2016-08-29 NOTE — Telephone Encounter (Signed)
Pt needs a refill of ezetimibe (ZETIA) 10 MG tablet   [facility]

## 2016-08-29 NOTE — Telephone Encounter (Signed)
Done

## 2016-08-30 ENCOUNTER — Telehealth: Payer: Self-pay

## 2016-08-30 DIAGNOSIS — I824Z9 Acute embolism and thrombosis of unspecified deep veins of unspecified distal lower extremity: Secondary | ICD-10-CM

## 2016-08-30 MED ORDER — RIVAROXABAN 10 MG PO TABS: 10.0000 mg | ORAL_TABLET | Freq: Every day | ORAL | 0 refills | Status: DC

## 2016-08-30 NOTE — Telephone Encounter (Signed)
Patient states his Pharm never received RX for Xarelto, on July 9th.? See Dr Hendrickson's note for 08/22/16, DX Pulmonary emboli- he has an IVC filter in place. Called into [facility] pharm Xarelto 10 mg po daily.#30 with no refills. Patient will follow up with Dr Donnetta Hutching on 09/19/16

## 2016-09-03 ENCOUNTER — Other Ambulatory Visit: Payer: Self-pay | Admitting: Internal Medicine

## 2016-09-08 ENCOUNTER — Other Ambulatory Visit: Payer: Self-pay | Admitting: Internal Medicine

## 2016-09-11 ENCOUNTER — Encounter: Payer: Self-pay | Admitting: Vascular Surgery

## 2016-09-19 ENCOUNTER — Encounter: Payer: Self-pay | Admitting: Vascular Surgery

## 2016-09-19 ENCOUNTER — Ambulatory Visit (INDEPENDENT_AMBULATORY_CARE_PROVIDER_SITE_OTHER): Payer: Managed Care, Other (non HMO) | Admitting: Vascular Surgery

## 2016-09-19 ENCOUNTER — Telehealth: Payer: Self-pay

## 2016-09-19 ENCOUNTER — Other Ambulatory Visit: Payer: Self-pay

## 2016-09-19 VITALS — BP 148/84 | HR 64 | Temp 97.7°F | Resp 20 | Ht 76.0 in | Wt 168.0 lb

## 2016-09-19 DIAGNOSIS — Z95828 Presence of other vascular implants and grafts: Secondary | ICD-10-CM

## 2016-09-19 NOTE — Progress Notes (Signed)
[facility] Specialist of [facility]  Patient name: Charles [name] MRN: [id number] DOB: Oct 20, 1950 Sex: male  REASON FOR VISIT: Discuss removal of IVC filter  HPI: Charles [name] is a 66 y.o. male here today for discussion of removal of IVC filter. An extensive, K hospitalization with bleeding into his chest and pneumothorax and eventual VATS. He had pulmonary embolus and was on anticoagulation and continue to have bleeding into his chest. Due to the necessity to stop his anticoagulation he had a vena caval filter placement myself on 08/04/2016. He has done well since discharge from the hospital and is now resumed his anticoagulation. He is here today for discussion of filter removal  Past Medical History:  Diagnosis Date  . ABNORMAL ELECTROCARDIOGRAM 11/02/2008  . ABSCESS, LUNG 10/23/2006  . Anemia 05/26/2014  . BACTERIAL PNEUMONIA 12/28/2009  . BPH (benign prostatic hyperplasia) 11/22/2010  . 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 CHEST PAIN 12/24/2009  . Coronary artery calcification seen on CT scan 06/01/2015  . Cramp of limb 07/19/2007  . DIVERTICULOSIS, COLON 03/07/2007  . DVT (deep venous thrombosis) ([place]) 01/2014   LLE  . Emphysema of lung ([place])   . 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/23/2006  . FLANK PAIN, LEFT 02/25/2010  . FREQUENCY, URINARY 12/24/2009  . HEMORRHOIDS 10/23/2006  . HYPERLIPIDEMIA 10/23/2006  . HYPERTENSION 10/23/2006  . PE (pulmonary embolism) 02/2014  . PLANTAR FASCIITIS, LEFT 11/02/2008  . Pneumonia 12/2013 X 2  . PSA, INCREASED 11/20/2007  . PULMONARY NODULE 05/14/2008  . Unspecified Peripheral Vascular Disease 10/23/2006    Family History  Problem Relation Age of Onset  . Heart disease Mother   . Heart disease Father   . Kidney disease Sister        Renal transplant  . Colon cancer Neg Hx     SOCIAL HISTORY: Social History  Substance Use Topics  . Smoking status: Former Smoker    Packs/day: 1.00    Years: 15.00    Types: Cigarettes   . Smokeless tobacco: Never Used     Comment: "quit smoking cigarettes in the 1990's"  . Alcohol use No     Comment: stopped drinking 02/2014, was drinking abou 4 shots per week    Allergies  Allergen Reactions  . No Known Allergies     Current Outpatient Prescriptions  Medication Sig Dispense Refill  . amLODipine (NORVASC) 10 MG tablet TAKE 1 TABLET BY MOUTH ONCE DAILY 90 tablet 0  . ezetimibe (ZETIA) 10 MG tablet Take 1 tablet (10 mg total) by mouth daily. 90 tablet 1  . rivaroxaban (XARELTO) 10 MG TABS tablet Take 1 tablet (10 mg total) by mouth daily. 30 tablet 0  . rosuvastatin (CRESTOR) 40 MG tablet TAKE 1 TABLET DAILY 90 tablet 1  . tadalafil (CIALIS) 5 MG tablet Take 1 tablet (5 mg total) by mouth daily. 30 tablet 11   No current facility-administered medications for this visit.     REVIEW OF SYSTEMS:  [X]  denotes positive finding, [ ]  denotes negative finding Cardiac  Comments:  Chest pain or chest pressure:    Shortness of breath upon exertion:    Short of breath when lying flat:    Irregular heart rhythm:        Vascular    Pain in calf, thigh, or hip brought on by ambulation:    Pain in feet at night that wakes you up from your sleep:     Blood clot in your veins:    Leg  swelling:           PHYSICAL EXAM: Vitals:   09/19/16 1132 09/19/16 1138  BP: (!) 154/83 (!) 148/84  Pulse: 64   Resp: 20   Temp: 97.7 F (36.5 C)   TempSrc: Oral   SpO2: 98%   Weight: 168 lb (76.2 kg)   Height: 6\' 4"  (1.93 m)     GENERAL: The patient is a well-nourished male, in no acute distress. The vital signs are documented above. CARDIOVASCULAR: Palpable radial pulses bilaterally. PULMONARY: There is good air exchange  MUSCULOSKELETAL: There are no major deformities or cyanosis. NEUROLOGIC: No focal weakness or paresthesias are detected. SKIN: There are no ulcers or rashes noted. Mild lower extremity swelling PSYCHIATRIC: The patient has a normal affect.    MEDICAL  ISSUES: Discussed the significance of his vena cava filter with the patient and his wife present. I have recommended removal. He does not have any long-term ongoing indication for vena cava filter since his indication was inability to anticoagulate at the time of surgery. We will schedule him for vena cava filter removal. X-ray at this would be done as an outpatient with an internal jugular approach. Explain the very low risk of bleeding and pneumothorax, occasions with the procedure. We will schedule this at his earliest convenience. He will maintain anticoagulation throughout the perioperative period    Rosetta Posner, MD [facility] Tel [phone] Pager [phone]

## 2016-09-19 NOTE — Telephone Encounter (Signed)
The pt. returned call and left voice message confirming he rec'd the message re: continuing Xarelto, prior to procedure.

## 2016-09-19 NOTE — Telephone Encounter (Signed)
Per Dr. Donnetta Hutching, pt. should not hold Xarelto prior to the procedure on 09/28/16 (IVC Filter Retrieval)  Phone call to the pt.  Left voice message to continue Xarelto daily, prior to procedure.  Requested pt. to call back and confirm he rec'd this message.

## 2016-09-25 ENCOUNTER — Other Ambulatory Visit: Payer: Self-pay

## 2016-09-26 ENCOUNTER — Encounter: Payer: Managed Care, Other (non HMO) | Admitting: Thoracic Surgery (Cardiothoracic Vascular Surgery)

## 2016-10-03 ENCOUNTER — Ambulatory Visit (HOSPITAL_COMMUNITY)
Admission: RE | Admit: 2016-10-03 | Discharge: 2016-10-03 | Disposition: A | Discharge status: Home / Self Care | Payer: 59 | Source: Ambulatory Visit | Attending: Vascular Surgery | Admitting: Vascular Surgery

## 2016-10-03 ENCOUNTER — Ambulatory Visit (HOSPITAL_COMMUNITY)
Admission: RE | Disposition: A | Discharge status: Home / Self Care | Payer: Self-pay | Source: Ambulatory Visit | Attending: Vascular Surgery

## 2016-10-03 ENCOUNTER — Encounter (HOSPITAL_COMMUNITY): Payer: Self-pay | Admitting: Surgery

## 2016-10-03 ENCOUNTER — Encounter: Payer: 59 | Admitting: Thoracic Surgery (Cardiothoracic Vascular Surgery)

## 2016-10-03 DIAGNOSIS — E785 Hyperlipidemia, unspecified: Secondary | ICD-10-CM | POA: Diagnosis not present

## 2016-10-03 DIAGNOSIS — N400 Enlarged prostate without lower urinary tract symptoms: Secondary | ICD-10-CM | POA: Insufficient documentation

## 2016-10-03 DIAGNOSIS — Z87891 Personal history of nicotine dependence: Secondary | ICD-10-CM | POA: Diagnosis not present

## 2016-10-03 DIAGNOSIS — Z4689 Encounter for fitting and adjustment of other specified devices: Secondary | ICD-10-CM | POA: Insufficient documentation

## 2016-10-03 DIAGNOSIS — Z8673 Personal history of transient ischemic attack (TIA), and cerebral infarction without residual deficits: Secondary | ICD-10-CM | POA: Insufficient documentation

## 2016-10-03 DIAGNOSIS — Z7901 Long term (current) use of anticoagulants: Secondary | ICD-10-CM | POA: Diagnosis not present

## 2016-10-03 DIAGNOSIS — I739 Peripheral vascular disease, unspecified: Secondary | ICD-10-CM | POA: Diagnosis not present

## 2016-10-03 DIAGNOSIS — J439 Emphysema, unspecified: Secondary | ICD-10-CM | POA: Diagnosis not present

## 2016-10-03 DIAGNOSIS — I2782 Chronic pulmonary embolism: Secondary | ICD-10-CM | POA: Diagnosis not present

## 2016-10-03 DIAGNOSIS — I2510 Atherosclerotic heart disease of native coronary artery without angina pectoris: Secondary | ICD-10-CM | POA: Insufficient documentation

## 2016-10-03 HISTORY — PX: IVC FILTER INSERTION: CATH118245

## 2016-10-03 LAB — POCT I-STAT, CHEM 8
BUN: 21 mg/dL — AB (ref 6–20)
CALCIUM ION: 1.07 mmol/L — AB (ref 1.15–1.40)
Chloride: 107 mmol/L (ref 101–111)
Creatinine, Ser: 1.8 mg/dL — ABNORMAL HIGH (ref 0.61–1.24)
Glucose, Bld: 86 mg/dL (ref 65–99)
HEMATOCRIT: 38 % — AB (ref 39.0–52.0)
Hemoglobin: 12.9 g/dL — ABNORMAL LOW (ref 13.0–17.0)
POTASSIUM: 2.9 mmol/L — AB (ref 3.5–5.1)
SODIUM: 141 mmol/L (ref 135–145)
TCO2: 23 mmol/L (ref 0–100)

## 2016-10-03 SURGERY — IVC FILTER INSERTION
Anesthesia: LOCAL

## 2016-10-03 MED ORDER — SODIUM CHLORIDE 0.9 % IV SOLN: INTRAVENOUS | Status: DC

## 2016-10-03 MED ORDER — MIDAZOLAM HCL 2 MG/2ML IJ SOLN
INTRAMUSCULAR | Status: DC | PRN
  Administered 2016-10-03 (×2): 1 mg via INTRAVENOUS

## 2016-10-03 MED ORDER — DOCUSATE SODIUM 100 MG PO CAPS: 100.0000 mg | ORAL_CAPSULE | Freq: Every day | ORAL | Status: DC

## 2016-10-03 MED ORDER — GUAIFENESIN-DM 100-10 MG/5ML PO SYRP: 15.0000 mL | ORAL_SOLUTION | ORAL | Status: DC | PRN

## 2016-10-03 MED ORDER — IODIXANOL 320 MG/ML IV SOLN
INTRAVENOUS | Status: DC | PRN
  Administered 2016-10-03: 15 mL via INTRAVENOUS

## 2016-10-03 MED ORDER — ACETAMINOPHEN 325 MG PO TABS: 325.0000 mg | ORAL_TABLET | ORAL | Status: DC | PRN

## 2016-10-03 MED ORDER — LIDOCAINE HCL (PF) 1 % IJ SOLN
INTRAMUSCULAR | Status: AC
  Filled 2016-10-03: qty 30

## 2016-10-03 MED ORDER — ACETAMINOPHEN 325 MG RE SUPP: 325.0000 mg | RECTAL | Status: DC | PRN

## 2016-10-03 MED ORDER — LIDOCAINE HCL (PF) 1 % IJ SOLN
INTRAMUSCULAR | Status: DC | PRN
  Administered 2016-10-03: 10 mL via SUBCUTANEOUS

## 2016-10-03 MED ORDER — SODIUM CHLORIDE 0.9 % IV SOLN
INTRAVENOUS | Status: DC
  Administered 2016-10-03: 07:00:00 via INTRAVENOUS

## 2016-10-03 MED ORDER — ONDANSETRON HCL 4 MG/2ML IJ SOLN: 4.0000 mg | Freq: Four times a day (QID) | INTRAMUSCULAR | Status: DC | PRN

## 2016-10-03 MED ORDER — HEPARIN (PORCINE) IN NACL 2-0.9 UNIT/ML-% IJ SOLN
INTRAMUSCULAR | Status: AC
  Filled 2016-10-03: qty 1000

## 2016-10-03 MED ORDER — ALUM & MAG HYDROXIDE-SIMETH 200-200-20 MG/5ML PO SUSP: 15.0000 mL | ORAL | Status: DC | PRN

## 2016-10-03 MED ORDER — MIDAZOLAM HCL 2 MG/2ML IJ SOLN
INTRAMUSCULAR | Status: AC
  Filled 2016-10-03: qty 2

## 2016-10-03 MED ORDER — METOPROLOL TARTRATE 5 MG/5ML IV SOLN: 2.0000 mg | INTRAVENOUS | Status: DC | PRN

## 2016-10-03 MED ORDER — FENTANYL CITRATE (PF) 100 MCG/2ML IJ SOLN
INTRAMUSCULAR | Status: AC
  Filled 2016-10-03: qty 2

## 2016-10-03 MED ORDER — LABETALOL HCL 5 MG/ML IV SOLN: 10.0000 mg | INTRAVENOUS | Status: DC | PRN

## 2016-10-03 MED ORDER — HEPARIN (PORCINE) IN NACL 2-0.9 UNIT/ML-% IJ SOLN
INTRAMUSCULAR | Status: AC | PRN
  Administered 2016-10-03: 500 mL

## 2016-10-03 MED ORDER — HYDRALAZINE HCL 20 MG/ML IJ SOLN: 5.0000 mg | INTRAMUSCULAR | Status: DC | PRN

## 2016-10-03 MED ORDER — PHENOL 1.4 % MT LIQD: 1.0000 | OROMUCOSAL | Status: DC | PRN

## 2016-10-03 MED ORDER — FENTANYL CITRATE (PF) 100 MCG/2ML IJ SOLN
INTRAMUSCULAR | Status: DC | PRN
  Administered 2016-10-03 (×2): 25 ug via INTRAVENOUS

## 2016-10-03 MED ORDER — SODIUM CHLORIDE 0.9 % IV SOLN: 500.0000 mL | Freq: Once | INTRAVENOUS | Status: DC | PRN

## 2016-10-03 SURGICAL SUPPLY — 10 items
CATH OMNI FLUSH 5F 65CM (CATHETERS) ×2 IMPLANT
DEVICE ONE SNARE 25MM (VASCULAR PRODUCTS) ×2 IMPLANT
KIT MICROINTRODUCER STIFF 5F (SHEATH) ×2 IMPLANT
KIT PV (KITS) ×2 IMPLANT
SHEATH PINNACLE ST 7F 45CM (SHEATH) ×2 IMPLANT
STOPCOCK MORSE 400PSI 3WAY (MISCELLANEOUS) ×2 IMPLANT
SYR MEDRAD MARK V 150ML (SYRINGE) ×2 IMPLANT
TRAY PV CATH (CUSTOM PROCEDURE TRAY) ×2 IMPLANT
TUBING HIGH PRESSURE 120CM (CONNECTOR) ×2 IMPLANT
WIRE BENTSON .035X145CM (WIRE) ×2 IMPLANT

## 2016-10-03 NOTE — Discharge Instructions (Signed)
Venogram, Care After °This sheet gives you information about how to care for yourself after your procedure. Your health care provider may also give you more specific instructions. If you have problems or questions, contact your health care provider. °What can I expect after the procedure? °After the procedure, it is common to have: °· Bruising or mild discomfort in the area where the IV was inserted (insertion site). ° °Follow these instructions at home: °Eating and drinking °· Follow instructions from your health care provider about eating or drinking restrictions. °· Drink a lot of fluids for the first several days after the procedure, as directed by your health care provider. This helps to wash (flush) the contrast out of your body. Examples of healthy fluids include water or low-calorie drinks. °General instructions °· Check your IV insertion area every day for signs of infection. Check for: °? Redness, swelling, or pain. °? Fluid or blood. °? Warmth. °? Pus or a bad smell. °· Take over-the-counter and prescription medicines only as told by your health care provider. °· Rest and return to your normal activities as told by your health care provider. Ask your health care provider what activities are safe for you. °· Do not drive for 24 hours if you were given a medicine to help you relax (sedative), or until your health care provider approves. °· Keep all follow-up visits as told by your health care provider. This is important. °Contact a health care provider if: °· Your skin becomes itchy or you develop a rash or hives. °· You have a fever that does not get better with medicine. °· You feel nauseous. °· You vomit. °· You have redness, swelling, or pain around the insertion site. °· You have fluid or blood coming from the insertion site. °· Your insertion area feels warm to the touch. °· You have pus or a bad smell coming from the insertion site. °Get help right away if: °· You have difficulty breathing or  shortness of breath. °· You develop chest pain. °· You faint. °· You feel very dizzy. °These symptoms may represent a serious problem that is an emergency. Do not wait to see if the symptoms will go away. Get medical help right away. Call your local emergency services (911 in the [place] Do not drive yourself to the hospital. °Summary °· After your procedure, it is common to have bruising or mild discomfort in the area where the IV was inserted. °· You should check your IV insertion area every day for signs of infection. °· Take over-the-counter and prescription medicines only as told by your health care provider. °· You should drink a lot of fluids for the first several days after the procedure to help flush the contrast from your body. °This information is not intended to replace advice given to you by your health care provider. Make sure you discuss any questions you have with your health care provider. °Document Released: 11/27/2012 Document Revised: 01/01/2016 Document Reviewed: 01/01/2016 °Elsevier Interactive Patient Education © 2017 [employer] ° °

## 2016-10-03 NOTE — H&P (View-Only) (Signed)
Vascular and Vein Specialist of [facility]  Patient name: Charles [name] MRN: [id number] DOB: 01-10-1951 Sex: male  REASON FOR VISIT: Discuss removal of IVC filter  HPI: Charles [name] is a 66 y.o. male here today for discussion of removal of IVC filter. An extensive, K hospitalization with bleeding into his chest and pneumothorax and eventual VATS. He had pulmonary embolus and was on anticoagulation and continue to have bleeding into his chest. Due to the necessity to stop his anticoagulation he had a vena caval filter placement myself on 08/04/2016. He has done well since discharge from the hospital and is now resumed his anticoagulation. He is here today for discussion of filter removal  Past Medical History:  Diagnosis Date  . ABNORMAL ELECTROCARDIOGRAM 11/02/2008  . ABSCESS, LUNG 10/23/2006  . Anemia 05/26/2014  . BACTERIAL PNEUMONIA 12/28/2009  . BPH (benign prostatic hyperplasia) 11/22/2010  . 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 CHEST PAIN 12/24/2009  . Coronary artery calcification seen on CT scan 06/01/2015  . Cramp of limb 07/19/2007  . DIVERTICULOSIS, COLON 03/07/2007  . DVT (deep venous thrombosis) ([place]) 01/2014   LLE  . Emphysema of lung ([place])   . 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/23/2006  . FLANK PAIN, LEFT 02/25/2010  . FREQUENCY, URINARY 12/24/2009  . HEMORRHOIDS 10/23/2006  . HYPERLIPIDEMIA 10/23/2006  . HYPERTENSION 10/23/2006  . PE (pulmonary embolism) 02/2014  . PLANTAR FASCIITIS, LEFT 11/02/2008  . Pneumonia 12/2013 X 2  . PSA, INCREASED 11/20/2007  . PULMONARY NODULE 05/14/2008  . Unspecified Peripheral Vascular Disease 10/23/2006    Family History  Problem Relation Age of Onset  . Heart disease Mother   . Heart disease Father   . Kidney disease Sister        Renal transplant  . Colon cancer Neg Hx     SOCIAL HISTORY: Social History  Substance Use Topics  . Smoking status: Former Smoker    Packs/day: 1.00    Years: 15.00    Types: Cigarettes   . Smokeless tobacco: Never Used     Comment: "quit smoking cigarettes in the 1990's"  . Alcohol use No     Comment: stopped drinking 02/2014, was drinking abou 4 shots per week    Allergies  Allergen Reactions  . No Known Allergies     Current Outpatient Prescriptions  Medication Sig Dispense Refill  . amLODipine (NORVASC) 10 MG tablet TAKE 1 TABLET BY MOUTH ONCE DAILY 90 tablet 0  . ezetimibe (ZETIA) 10 MG tablet Take 1 tablet (10 mg total) by mouth daily. 90 tablet 1  . rivaroxaban (XARELTO) 10 MG TABS tablet Take 1 tablet (10 mg total) by mouth daily. 30 tablet 0  . rosuvastatin (CRESTOR) 40 MG tablet TAKE 1 TABLET DAILY 90 tablet 1  . tadalafil (CIALIS) 5 MG tablet Take 1 tablet (5 mg total) by mouth daily. 30 tablet 11   No current facility-administered medications for this visit.     REVIEW OF SYSTEMS:  [X]  denotes positive finding, [ ]  denotes negative finding Cardiac  Comments:  Chest pain or chest pressure:    Shortness of breath upon exertion:    Short of breath when lying flat:    Irregular heart rhythm:        Vascular    Pain in calf, thigh, or hip brought on by ambulation:    Pain in feet at night that wakes you up from your sleep:     Blood clot in your veins:    Leg  swelling:           PHYSICAL EXAM: Vitals:   09/19/16 1132 09/19/16 1138  BP: (!) 154/83 (!) 148/84  Pulse: 64   Resp: 20   Temp: 97.7 F (36.5 C)   TempSrc: Oral   SpO2: 98%   Weight: 168 lb (76.2 kg)   Height: 6\' 4"  (1.93 m)     GENERAL: The patient is a well-nourished male, in no acute distress. The vital signs are documented above. CARDIOVASCULAR: Palpable radial pulses bilaterally. PULMONARY: There is good air exchange  MUSCULOSKELETAL: There are no major deformities or cyanosis. NEUROLOGIC: No focal weakness or paresthesias are detected. SKIN: There are no ulcers or rashes noted. Mild lower extremity swelling PSYCHIATRIC: The patient has a normal affect.    MEDICAL  ISSUES: Discussed the significance of his vena cava filter with the patient and his wife present. I have recommended removal. He does not have any long-term ongoing indication for vena cava filter since his indication was inability to anticoagulate at the time of surgery. We will schedule him for vena cava filter removal. X-ray at this would be done as an outpatient with an internal jugular approach. Explain the very low risk of bleeding and pneumothorax, occasions with the procedure. We will schedule this at his earliest convenience. He will maintain anticoagulation throughout the perioperative period    Rosetta Posner, MD [facility] Vascular and Vein Specialists of [facility] Tel [phone] Pager [phone]

## 2016-10-03 NOTE — Interval H&P Note (Signed)
History and Physical Interval Note:  10/03/2016 7:25 AM  Charles [name]  has presented today for surgery, with the diagnosis of completion of theropy  The various methods of treatment have been discussed with the patient and family. After consideration of risks, benefits and other options for treatment, the patient has consented to  Procedure(s): IVC Filter Retrieveal (N/A) as a surgical intervention .  The patient's history has been reviewed, patient examined, no change in status, stable for surgery.  I have reviewed the patient's chart and labs.  Questions were answered to the patient's satisfaction.     Annamarie Major

## 2016-10-03 NOTE — Op Note (Signed)
    Patient name: Charles [name] MRN: [id number] DOB: 10-Jan-1951 Sex: male  10/03/2016 Pre-operative Diagnosis: IVC filter Post-operative diagnosis:  Same Surgeon:  Annamarie Major Procedure Performed:  1.  Ultrasound-guided access, right internal jugular vein  2.  Catheter in inferior vena cava  3.  Inferior venacavogram  4.  Removal of foreign body (IVC filter)  5.  Conscious sedation (37 minutes)  Contrast:  12cc   Indications:  The patient has had a removable IVC filter placed for surgical prophylaxis.  He comes in today for removal.  Procedure:  The patient was identified in the holding area and taken to room 8.  The patient was then placed supine on the table and prepped and draped in the usual sterile fashion.  A time out was called.  Conscious sedation was administered with the use of IV fentanyl and Versed in a continuous physician and nurse monitoring.  Heart rate, blood pressure, and oxygen saturations were continuously monitored.  Ultrasound was used to evaluate the right internal jugular vein which was patent and compressible.  1% lidocaine was used for local anesthesia.  The right internal jugular vein was then cannulated under ultrasound guidance with an 18-gauge needle.  A 035 wire was advanced into the superior vena cava.  I then inserted a Omni flush catheter and directed the wire and catheter into the inferior vena cava below the filter.  A IVC gram wasn't performed   Findings:   IVC gram: No evidence of thrombus noted within the IVC filter  Intervention:  After the above images were acquired the decision was made to proceed with intervention.  The Omni flush catheter was removed and a 7 [place] destination sheath was advanced into the inferior vena cava.  I selected a 25 mm gooseneck snare and used this to snare the hook on the filter.  Once this was graft, the 7 French sheath was advanced over the filter in the filter was withdrawn into the sheath.  The sheath and filter were  removed in their entirety.  I inspected the filter and it was  intact.  Manual pressure was held until hemostasis was achieved in the right neck.  Sterile dressings were applied.  There no immediate complications  Impression:  #1  successful removal of inferior vena cava filter    V. Annamarie Major, M.D. [facility] of [place] Office: [phone] Pager:  [phone]

## 2016-10-04 MED FILL — Heparin Sodium (Porcine) 2 Unit/ML in Sodium Chloride 0.9%: INTRAMUSCULAR | Qty: 500 | Status: AC

## 2016-10-10 ENCOUNTER — Other Ambulatory Visit: Payer: Self-pay | Admitting: Internal Medicine

## 2016-10-11 ENCOUNTER — Encounter: Payer: 59 | Admitting: Thoracic Surgery (Cardiothoracic Vascular Surgery)

## 2016-10-12 ENCOUNTER — Encounter: Payer: 59 | Admitting: Thoracic Surgery (Cardiothoracic Vascular Surgery)

## 2016-10-16 ENCOUNTER — Other Ambulatory Visit: Payer: Self-pay | Admitting: Thoracic Surgery (Cardiothoracic Vascular Surgery)

## 2016-10-16 DIAGNOSIS — J439 Emphysema, unspecified: Secondary | ICD-10-CM

## 2016-10-17 ENCOUNTER — Encounter: Payer: Self-pay | Admitting: Thoracic Surgery (Cardiothoracic Vascular Surgery)

## 2016-10-17 ENCOUNTER — Encounter: Payer: 59 | Admitting: Thoracic Surgery (Cardiothoracic Vascular Surgery)

## 2016-10-17 ENCOUNTER — Ambulatory Visit (INDEPENDENT_AMBULATORY_CARE_PROVIDER_SITE_OTHER): Payer: Self-pay | Admitting: Thoracic Surgery (Cardiothoracic Vascular Surgery)

## 2016-10-17 ENCOUNTER — Ambulatory Visit
Admission: RE | Admit: 2016-10-17 | Discharge: 2016-10-17 | Disposition: A | Discharge status: Home / Self Care | Payer: 59 | Source: Ambulatory Visit | Attending: Thoracic Surgery (Cardiothoracic Vascular Surgery) | Admitting: Thoracic Surgery (Cardiothoracic Vascular Surgery)

## 2016-10-17 VITALS — BP 140/68 | HR 68 | Resp 18 | Ht 76.0 in | Wt 166.0 lb

## 2016-10-17 DIAGNOSIS — J942 Hemothorax: Secondary | ICD-10-CM

## 2016-10-17 DIAGNOSIS — J439 Emphysema, unspecified: Secondary | ICD-10-CM

## 2016-10-17 DIAGNOSIS — J939 Pneumothorax, unspecified: Secondary | ICD-10-CM

## 2016-10-17 NOTE — Progress Notes (Signed)
[address]       [place] [address]             [phone]      HPI: Charles [name] returns for a scheduled postoperative follow-up visit.  Charles [name] is a 66 year old man who presented with chest pain and shortness of breath and June 2018. He was found to have bilateral pulmonary emboli and a large right spontaneous pneumothorax. He had a catheter placed in the ED, but had bleeding around that and it eventually fell out. A chest tube was placed but that became clotted blood as he was on full dose anticoagulation. I did right vats and apical blebectomy on 07/31/2016. His anticoagulation was started back couple days later and he developed a large pneumothorax. I did a redo right vats to evacuate the hemothorax and Dr. Tawni Millers placed an IVC filter at the same time.  I saw him back on the office on 08/22/2016. He was doing well at that time.  He had his IVC filter removed on 10/03/2016.  He has some incisional discomfort from time to time. He is only taking Tylenol for that. He denies swelling in his legs or shortness of breath.  Past Medical History:  Diagnosis Date  . ABNORMAL ELECTROCARDIOGRAM 11/02/2008  . ABSCESS, LUNG 10/23/2006  . Anemia 05/26/2014  . BACTERIAL PNEUMONIA 12/28/2009  . BPH (benign prostatic hyperplasia) 11/22/2010  . 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 CHEST PAIN 12/24/2009  . Coronary artery calcification seen on CT scan 06/01/2015  . Cramp of limb 07/19/2007  . DIVERTICULOSIS, COLON 03/07/2007  . DVT (deep venous thrombosis) ([place]) 01/2014   LLE  . Emphysema of lung ([place])   . 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/23/2006  . FLANK PAIN, LEFT 02/25/2010  . FREQUENCY, URINARY 12/24/2009  . HEMORRHOIDS 10/23/2006  . HYPERLIPIDEMIA 10/23/2006  . HYPERTENSION 10/23/2006  . PE (pulmonary embolism) 02/2014  . PLANTAR FASCIITIS, LEFT 11/02/2008  . Pneumonia 12/2013 X 2  . PSA, INCREASED 11/20/2007  . PULMONARY NODULE 05/14/2008  . Unspecified Peripheral Vascular Disease 10/23/2006       Current Outpatient Prescriptions  Medication Sig Dispense Refill  . amLODipine (NORVASC) 10 MG tablet TAKE 1 TABLET BY MOUTH ONCE DAILY 90 tablet 0  . ezetimibe (ZETIA) 10 MG tablet Take 1 tablet (10 mg total) by mouth daily. 90 tablet 1  . rivaroxaban (XARELTO) 10 MG TABS tablet Take 1 tablet (10 mg total) by mouth daily. 30 tablet 0  . rosuvastatin (CRESTOR) 40 MG tablet TAKE 1 TABLET DAILY 90 tablet 1  . tadalafil (CIALIS) 5 MG tablet Take 1 tablet (5 mg total) by mouth daily. 30 tablet 11   No current facility-administered medications for this visit.     Physical Exam BP 140/68   Pulse 68   Resp 18   Ht 6\' 4"  (1.93 m)   Wt 166 lb (75.3 kg)   SpO2 99%   BMI 20.50 kg/m  66 year old man in no acute distress Alert 3 with no focal deficits Lungs diminished in right base otherwise clear Incisions well healed  Diagnostic Tests: CHEST  2 VIEW  COMPARISON:  08/22/2016.  FINDINGS: Interval removal of right chest wall surgical staples. Postsurgical changes right lung. Interim continued improvement of right base atelectasis. Tiny right pleural effusion. No pneumothorax. Heart size stable . No acute bony abnormality.  IMPRESSION: 1. Postsurgical changes right lung again noted. Small right pleural effusion. No pneumothorax.  2.  Continued interim improvement of right base  atelectasis .   Electronically Signed   By: Marcello Moores  Register   On: 10/17/2016 11:28 I personally reviewed the chest x-ray and concur with the findings noted above  Impression: Charles [name] is a 67 year old gentleman who presented with chest pain and shortness of breath and was found to have both bilateral pulmonary emboli and a large right spontaneous pneumothorax. He had chest tubes placed initially but had bleeding complications resulted in the tubes becoming clotted. He had a right vats stable apical blebs and reexpand the lung, but had a hemothorax after his anticoagulation was resumed. He  required a redo VATS to evacuate the hemothorax and had an IVC filter placed at that time.  In the interim since his last visit he has restarted Xarelto and had an IVC filter removed without complication.  From a surgical standpoint he's doing well. There are no restrictions on his activities.  Plan: I will be happy to see Charles [name] back any time the future if I can be of any further assistance with his care  Melrose Nakayama, MD [facility] [phone]

## 2016-10-24 ENCOUNTER — Ambulatory Visit (INDEPENDENT_AMBULATORY_CARE_PROVIDER_SITE_OTHER)
Admission: RE | Admit: 2016-10-24 | Discharge: 2016-10-24 | Disposition: A | Discharge status: Home / Self Care | Payer: 59 | Source: Ambulatory Visit | Attending: Emergency Medicine | Admitting: Emergency Medicine

## 2016-10-24 DIAGNOSIS — R911 Solitary pulmonary nodule: Secondary | ICD-10-CM | POA: Diagnosis not present

## 2016-11-04 ENCOUNTER — Other Ambulatory Visit: Payer: Self-pay | Admitting: Thoracic Surgery (Cardiothoracic Vascular Surgery)

## 2016-11-04 DIAGNOSIS — I824Z9 Acute embolism and thrombosis of unspecified deep veins of unspecified distal lower extremity: Secondary | ICD-10-CM

## 2016-11-23 ENCOUNTER — Encounter: Payer: Self-pay | Admitting: Emergency Medicine

## 2016-11-23 ENCOUNTER — Ambulatory Visit (INDEPENDENT_AMBULATORY_CARE_PROVIDER_SITE_OTHER): Payer: 59 | Admitting: Emergency Medicine

## 2016-11-23 DIAGNOSIS — R911 Solitary pulmonary nodule: Secondary | ICD-10-CM

## 2016-11-23 DIAGNOSIS — J449 Chronic obstructive pulmonary disease, unspecified: Secondary | ICD-10-CM | POA: Diagnosis not present

## 2016-11-23 DIAGNOSIS — I2699 Other pulmonary embolism without acute cor pulmonale: Secondary | ICD-10-CM

## 2016-11-23 NOTE — Assessment & Plan Note (Signed)
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

## 2016-11-23 NOTE — Patient Instructions (Signed)
Your CT scan of the chest is stable compared with her prior scan. The left lower lobe pulmonary nodule that we have been following appears to be benign. You should not need any further scans to follow this. Follow with Dr Lamonte Sakai in 12 months or sooner if you have any problems. If your breathing changes we would like to see you sooner.

## 2016-11-23 NOTE — Progress Notes (Signed)
Subjective:    Patient ID: Charles [name], male    DOB: [birth year], 66 y.o.   MRN: [id number]  HPI  ROV 11/23/16 -- this is a follow-up visit for patient with a history of COPD, pulmonary embolism on Xarelto. We have biopsied a left lower lobe spiculated nodule, pathology negative. We have subsequently been following with serial CT scans of the chest. Most recent scan 10/24/16 that I have personally reviewed. This shows stable appearance of left lower lobe spiculated area, probably scar. Left upper lobe irregular density has resolved. No evidence of malignancy. He was admitted in June for spontaneous R Ptx, required VATS. He believes that his breathing is doing well. Not on BD's. He is interested in LDCT, but he quit smoking about 27 yrs ago.   Review of Systems  Constitutional: Negative for appetite change, fever and unexpected weight change.  HENT: Negative for congestion, dental problem, ear pain, nosebleeds, postnasal drip, rhinorrhea, sinus pressure, sneezing, sore throat and trouble swallowing.   Eyes: Negative for redness and itching.  Respiratory: Negative for cough, chest tightness, shortness of breath and wheezing.   Cardiovascular: Negative for palpitations and leg swelling.  Gastrointestinal: Negative for nausea and vomiting.  Genitourinary: Negative for dysuria.  Musculoskeletal: Negative for joint swelling.  Skin: Negative for rash.  Neurological: Negative for headaches.  Hematological: Does not bruise/bleed easily.  Psychiatric/Behavioral: Negative for dysphoric mood. The patient is not nervous/anxious.     Past Medical History:  Diagnosis Date  . ABNORMAL ELECTROCARDIOGRAM 11/02/2008  . ABSCESS, LUNG 10/23/2006  . Anemia 05/26/2014  . BACTERIAL PNEUMONIA 12/28/2009  . BPH (benign prostatic hyperplasia) 11/22/2010  . 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 CHEST PAIN 12/24/2009  . Coronary artery calcification seen on CT scan 06/01/2015  . Cramp of limb 07/19/2007  . DIVERTICULOSIS,  COLON 03/07/2007  . DVT (deep venous thrombosis) ([place]) 01/2014   LLE  . Emphysema of lung ([place])   . 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/23/2006  . FLANK PAIN, LEFT 02/25/2010  . FREQUENCY, URINARY 12/24/2009  . HEMORRHOIDS 10/23/2006  . HYPERLIPIDEMIA 10/23/2006  . HYPERTENSION 10/23/2006  . PE (pulmonary embolism) 02/2014  . PLANTAR FASCIITIS, LEFT 11/02/2008  . Pneumonia 12/2013 X 2  . PSA, INCREASED 11/20/2007  . PULMONARY NODULE 05/14/2008  . Unspecified Peripheral Vascular Disease 10/23/2006     Family History  Problem Relation Age of Onset  . Heart disease Mother   . Heart disease Father   . Kidney disease Sister        Renal transplant  . Colon cancer Neg Hx      Social History   Social History  . Marital status: Married    Spouse name: N/A  . Number of children: 2  . Years of education: N/A   Occupational History  . retired    Social History Main Topics  . Smoking status: Former Smoker    Packs/day: 1.00    Years: 15.00    Types: Cigarettes  . Smokeless tobacco: Never Used     Comment: "quit smoking cigarettes in the 1990's"  . Alcohol use No     Comment: stopped drinking 02/2014, was drinking abou 4 shots per week  . Drug use: Yes    Types: Marijuana     Comment: 03/09/2014 "smoke marijuana q couple days"  . Sexual activity: Yes   Other Topics Concern  . Not on file   Social History Narrative  . No narrative on file     No Known Allergies  Outpatient Medications Prior to Visit  Medication Sig Dispense Refill  . amLODipine (NORVASC) 10 MG tablet TAKE 1 TABLET BY MOUTH ONCE DAILY 90 tablet 0  . ezetimibe (ZETIA) 10 MG tablet Take 1 tablet (10 mg total) by mouth daily. 90 tablet 1  . rosuvastatin (CRESTOR) 40 MG tablet TAKE 1 TABLET DAILY 90 tablet 1  . tadalafil (CIALIS) 5 MG tablet Take 1 tablet (5 mg total) by mouth daily. 30 tablet 11  . XARELTO 10 MG TABS tablet TAKE 1 TABLET BY MOUTH ONCE DAILY 30 tablet 0   No facility-administered medications prior to visit.           Objective:   Physical Exam Vitals:   11/23/16 0908  BP: 124/80  Pulse: 71  SpO2: 98%  Weight: 163 lb (73.9 kg)  Height: 6\' 4"  (1.93 m)   Gen: Pleasant, thin, in no distress,  normal affect  ENT: No lesions,  mouth clear,  oropharynx clear, no postnasal drip  Neck: No JVD, no TMG, no carotid bruits  Lungs: No use of accessory muscles, clear without rales or rhonchi  Cardiovascular: RRR, heart sounds normal, no murmur or gallops, no peripheral edema  Musculoskeletal: No deformities, no cyanosis or clubbing  Neuro: alert, non focal  Skin: Warm, no lesions or rashes      Assessment & Plan:  COPD (chronic obstructive pulmonary disease) (HCC) Mild obstruction. No pulmonary limitations. He is currently off any bronchodilators. Follow him, clinical status, off inhaled medication for now. Reassess annually or if he develops new symptoms.  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 Likely benign scar. No further serial CT scans indicated. He is not a low-dose CT scan screening candidate because he quit smoking 27 years ago.  Baltazar Apo, MD, PhD 11/23/2016, 9:29 AM [facility] [phone] or if no answer [phone]

## 2016-11-23 NOTE — Assessment & Plan Note (Signed)
Mild obstruction. No pulmonary limitations. He is currently off any bronchodilators. Follow him, clinical status, off inhaled medication for now. Reassess annually or if he develops new symptoms.

## 2016-11-23 NOTE — Assessment & Plan Note (Signed)
Area of left lower lobe spiculation has been stable on serial CT scans ever since we attempted transbronchial biopsy. Likely benign scar. No further serial CT scans indicated. He is not a low-dose CT scan screening candidate because he quit smoking 27 years ago.

## 2016-12-09 ENCOUNTER — Other Ambulatory Visit: Payer: Self-pay | Admitting: Cardiothoracic Surgery

## 2016-12-09 DIAGNOSIS — I824Z9 Acute embolism and thrombosis of unspecified deep veins of unspecified distal lower extremity: Secondary | ICD-10-CM

## 2016-12-11 ENCOUNTER — Telehealth: Payer: Self-pay | Admitting: Internal Medicine

## 2016-12-11 NOTE — Telephone Encounter (Signed)
Pt called in and needs refill on his  XARELTO 10 MG TABS tablet [[id number]]   [facility] on file

## 2016-12-11 NOTE — Telephone Encounter (Signed)
Per chart pt need to contact his cardiologist for refills. Was originally rx by Dr. Tharon Aquas Trigt which a refill was sent to him and was told to contact hic cardiologist.../lmb

## 2016-12-12 ENCOUNTER — Other Ambulatory Visit: Payer: Self-pay | Admitting: *Deleted

## 2016-12-12 DIAGNOSIS — I824Z9 Acute embolism and thrombosis of unspecified deep veins of unspecified distal lower extremity: Secondary | ICD-10-CM

## 2016-12-12 MED ORDER — RIVAROXABAN 10 MG PO TABS: 10.0000 mg | ORAL_TABLET | Freq: Every day | ORAL | 0 refills | Status: DC

## 2016-12-19 ENCOUNTER — Other Ambulatory Visit: Payer: 59

## 2016-12-19 ENCOUNTER — Telehealth: Payer: Self-pay

## 2016-12-19 NOTE — Telephone Encounter (Signed)
Left message advising that patient's labs have been entered by dr Jenny Reichmann, per patient, his insurance will cover cost if labs are done on different day than office visit--patient can come to lab at his convenience

## 2016-12-19 NOTE — Telephone Encounter (Signed)
Patient would like to have labs done before his upcoming physical---can you please enter labs you need to see and I will call patient to come back in for labs--routing to dr Jenny Reichmann, please advise, thanks

## 2016-12-19 NOTE — Telephone Encounter (Signed)
Ok for Prevention code - cbc, bmp, LFTs, TSH, UA, lipids and PSA

## 2016-12-20 ENCOUNTER — Telehealth: Payer: Self-pay

## 2016-12-20 ENCOUNTER — Other Ambulatory Visit (INDEPENDENT_AMBULATORY_CARE_PROVIDER_SITE_OTHER): Payer: 59

## 2016-12-20 DIAGNOSIS — Z0000 Encounter for general adult medical examination without abnormal findings: Secondary | ICD-10-CM

## 2016-12-20 LAB — LIPID PANEL
CHOL/HDL RATIO: 2
Cholesterol: 200 mg/dL (ref 0–200)
HDL: 83.4 mg/dL (ref >39.00)
LDL CALC: 87 mg/dL (ref 0–99)
NONHDL: 117.07
TRIGLYCERIDES: 152 mg/dL — AB (ref 0.0–149.0)
VLDL: 30.4 mg/dL (ref 0.0–40.0)

## 2016-12-20 LAB — BASIC METABOLIC PANEL
BUN: 16 mg/dL (ref 6–23)
CO2: 25 mEq/L (ref 19–32)
CREATININE: 1.31 mg/dL (ref 0.40–1.50)
Calcium: 8.6 mg/dL (ref 8.4–10.5)
Chloride: 104 mEq/L (ref 96–112)
GFR: 70.77 mL/min (ref >60.00)
GLUCOSE: 110 mg/dL — AB (ref 70–99)
Potassium: 4.5 mEq/L (ref 3.5–5.1)
Sodium: 136 mEq/L (ref 135–145)

## 2016-12-20 LAB — PSA: PSA: 7.97 ng/mL — ABNORMAL HIGH (ref 0.10–4.00)

## 2016-12-20 LAB — HEPATIC FUNCTION PANEL
ALBUMIN: 3.4 g/dL — AB (ref 3.5–5.2)
ALT: 19 U/L (ref 0–53)
AST: 29 U/L (ref 0–37)
Alkaline Phosphatase: 45 U/L (ref 39–117)
Bilirubin, Direct: 0.1 mg/dL (ref 0.0–0.3)
TOTAL PROTEIN: 6.8 g/dL (ref 6.0–8.3)
Total Bilirubin: 0.6 mg/dL (ref 0.2–1.2)

## 2016-12-20 LAB — CBC
HEMATOCRIT: 34.3 % — AB (ref 39.0–52.0)
Hemoglobin: 11.4 g/dL — ABNORMAL LOW (ref 13.0–17.0)
MCHC: 33.2 g/dL (ref 30.0–36.0)
MCV: 100.8 fl — AB (ref 78.0–100.0)
PLATELETS: 199 10*3/uL (ref 150.0–400.0)
RBC: 3.41 Mil/uL — AB (ref 4.22–5.81)
RDW: 14.1 % (ref 11.5–15.5)
WBC: 8.7 10*3/uL (ref 4.0–10.5)

## 2016-12-20 LAB — TSH: TSH: 1.19 u[IU]/mL (ref 0.35–4.50)

## 2016-12-20 NOTE — Telephone Encounter (Signed)
Labs have been entered. 

## 2016-12-20 NOTE — Telephone Encounter (Signed)
error 

## 2016-12-20 NOTE — Telephone Encounter (Signed)
Patient is wanting to come get his labs done and I do not see the orders in the computer. Can we make sure they are in. Thank you.

## 2016-12-21 ENCOUNTER — Ambulatory Visit (INDEPENDENT_AMBULATORY_CARE_PROVIDER_SITE_OTHER): Payer: 59 | Admitting: Internal Medicine

## 2016-12-21 ENCOUNTER — Encounter: Payer: Self-pay | Admitting: Internal Medicine

## 2016-12-21 ENCOUNTER — Other Ambulatory Visit: Payer: Self-pay | Admitting: Internal Medicine

## 2016-12-21 VITALS — BP 138/76 | HR 96 | Temp 97.6°F | Ht 76.0 in | Wt 166.0 lb

## 2016-12-21 DIAGNOSIS — I10 Essential (primary) hypertension: Secondary | ICD-10-CM | POA: Diagnosis not present

## 2016-12-21 DIAGNOSIS — R972 Elevated prostate specific antigen [PSA]: Secondary | ICD-10-CM

## 2016-12-21 DIAGNOSIS — J449 Chronic obstructive pulmonary disease, unspecified: Secondary | ICD-10-CM | POA: Diagnosis not present

## 2016-12-21 DIAGNOSIS — I824Z9 Acute embolism and thrombosis of unspecified deep veins of unspecified distal lower extremity: Secondary | ICD-10-CM | POA: Diagnosis not present

## 2016-12-21 DIAGNOSIS — F1090 Alcohol use, unspecified, uncomplicated: Secondary | ICD-10-CM

## 2016-12-21 DIAGNOSIS — Z0000 Encounter for general adult medical examination without abnormal findings: Secondary | ICD-10-CM | POA: Diagnosis not present

## 2016-12-21 DIAGNOSIS — Z7289 Other problems related to lifestyle: Secondary | ICD-10-CM

## 2016-12-21 LAB — URINALYSIS, ROUTINE W REFLEX MICROSCOPIC
BILIRUBIN URINE: NEGATIVE
Ketones, ur: NEGATIVE
Nitrite: NEGATIVE
PH: 6 (ref 5.0–8.0)
SPECIFIC GRAVITY, URINE: 1.025 (ref 1.000–1.030)
TOTAL PROTEIN, URINE-UPE24: 30 — AB
Urine Glucose: NEGATIVE
Urobilinogen, UA: 0.2 (ref 0.0–1.0)

## 2016-12-21 MED ORDER — TADALAFIL 20 MG PO TABS: 20.0000 mg | ORAL_TABLET | Freq: Every day | ORAL | 11 refills | Status: DC | PRN

## 2016-12-21 MED ORDER — ROSUVASTATIN CALCIUM 40 MG PO TABS: 40.0000 mg | ORAL_TABLET | Freq: Every day | ORAL | 3 refills | Status: DC

## 2016-12-21 MED ORDER — RIVAROXABAN 10 MG PO TABS: 10.0000 mg | ORAL_TABLET | Freq: Every day | ORAL | 3 refills | Status: DC

## 2016-12-21 MED ORDER — AMLODIPINE BESYLATE 10 MG PO TABS: 10.0000 mg | ORAL_TABLET | Freq: Every day | ORAL | 3 refills | Status: DC

## 2016-12-21 MED ORDER — EZETIMIBE 10 MG PO TABS: 10.0000 mg | ORAL_TABLET | Freq: Every day | ORAL | 3 refills | Status: DC

## 2016-12-21 MED ORDER — DOXYCYCLINE HYCLATE 100 MG PO TABS: 100.0000 mg | ORAL_TABLET | Freq: Two times a day (BID) | ORAL | 0 refills | Status: DC

## 2016-12-21 NOTE — Patient Instructions (Signed)
Please continue all other medications as before, and refills have been done if requested.  Please have the pharmacy call with any other refills you may need.  Please continue your efforts at being more active, low cholesterol diet, and weight control.  You are otherwise up to date with prevention measures today.  Please keep your appointments with your specialists as you may have planned  Your lab work was done this AM  You will be contacted by phone if any changes need to be made immediately.  Otherwise, you will receive a letter about your results with an explanation, but please check with MyChart first.  Please remember to sign up for MyChart if you have not done so, as this will be important to you in the future with finding out test results, communicating by private email, and scheduling acute appointments online when needed.  Please return in 6 months, or sooner if needed

## 2016-12-21 NOTE — Assessment & Plan Note (Signed)
Had stopped for 3 yrs, now resumed in last few months at reported 3 drinks per day; I encouraged abstinence as he has at least mild higher fall risk due to cerebellar stroke and some balance difficulty while on xarelto with risk of bleed with injury

## 2016-12-21 NOTE — Progress Notes (Signed)
Subjective:    Patient ID: Charles [name], male    DOB: [birth year], 66 y.o.   MRN: [id number]  HPI  Here for wellness and f/u;  Overall doing ok;  Pt denies Chest pain, worsening SOB, DOE, wheezing, orthopnea, PND, worsening LE edema, palpitations, dizziness or syncope.  Pt denies neurological change such as new headache, facial or extremity weakness.  Pt denies polydipsia, polyuria, or low sugar symptoms. Pt states overall good compliance with treatment and medications, good tolerability, and has been trying to follow appropriate diet.  Pt denies worsening depressive symptoms, suicidal ideation or panic. No fever, night sweats, wt loss, loss of appetite, or other constitutional symptoms.  Pt states good ability with ADL's, has low fall risk, home safety reviewed and adequate, no other significant changes in hearing or vision, and only occasionally active with exercise.  Declines flu shot Wt Readings from Last 3 Encounters:  12/21/16 166 lb (75.3 kg)  11/23/16 163 lb (73.9 kg)  10/17/16 166 lb (75.3 kg)   BP Readings from Last 3 Encounters:  12/21/16 138/76  11/23/16 124/80  10/17/16 140/68  Unfortunately has resumed ETOH use after 3 yrs abstinence, now at reported 3 drinks per day  No other interval hx Past Medical History:  Diagnosis Date  . ABNORMAL ELECTROCARDIOGRAM 11/02/2008  . ABSCESS, LUNG 10/23/2006  . Anemia 05/26/2014  . BACTERIAL PNEUMONIA 12/28/2009  . BPH (benign prostatic hyperplasia) 11/22/2010  . 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 CHEST PAIN 12/24/2009  . Coronary artery calcification seen on CT scan 06/01/2015  . Cramp of limb 07/19/2007  . DIVERTICULOSIS, COLON 03/07/2007  . DVT (deep venous thrombosis) ([place]) 01/2014   LLE  . Emphysema of lung ([place])   . 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/23/2006  . FLANK PAIN, LEFT 02/25/2010  . FREQUENCY, URINARY 12/24/2009  . HEMORRHOIDS 10/23/2006  . HYPERLIPIDEMIA 10/23/2006  . HYPERTENSION 10/23/2006  . PE (pulmonary embolism) 02/2014  . PLANTAR  FASCIITIS, LEFT 11/02/2008  . Pneumonia 12/2013 X 2  . PSA, INCREASED 11/20/2007  . PULMONARY NODULE 05/14/2008  . Unspecified Peripheral Vascular Disease 10/23/2006   Past Surgical History:  Procedure Laterality Date  . HEMORRHOID SURGERY  ?1990's  . INGUINAL HERNIA REPAIR Bilateral ?2000's  . IVC FILTER INSERTION N/A 10/03/2016   Procedure: IVC Filter Retrieveal;  Surgeon: Serafina Mitchell, MD;  Location: [place] [facility];  Service: Cardiovascular;  Laterality: N/A;  . SHOULDER ARTHROSCOPY W/ ROTATOR CUFF REPAIR Right 11/2013  . STAPLING OF BLEBS Right 07/31/2016   Procedure: BLEBECTOMY;  Surgeon: Melrose Nakayama, MD;  Location: [facility];  Service: Thoracic;  Laterality: Right;  . VENA CAVA FILTER PLACEMENT Right 08/04/2016   Procedure: INSERTION VENA-CAVA FILTER;  Surgeon: Melrose Nakayama, MD;  Location: [place] [facility];  Service: Thoracic;  Laterality: Right;  Marland Kitchen VIDEO ASSISTED THORACOSCOPY Right 07/31/2016   Procedure: VIDEO ASSISTED THORACOSCOPY;  Surgeon: Melrose Nakayama, MD;  Location: [facility];  Service: Thoracic;  Laterality: Right;  Marland Kitchen VIDEO ASSISTED THORACOSCOPY Right 08/04/2016   Procedure: REDO VIDEO ASSISTED THORACOSCOPY- EVACUATION OF HEMOTHORAX;  Surgeon: Melrose Nakayama, MD;  Location: [facility];  Service: Thoracic;  Laterality: Right;  Marland Kitchen VIDEO BRONCHOSCOPY WITH ENDOBRONCHIAL NAVIGATION N/A 03/11/2014   Procedure: VIDEO BRONCHOSCOPY WITH ENDOBRONCHIAL NAVIGATION;  Surgeon: Collene Gobble, MD;  Location: [facility];  Service: Thoracic;  Laterality: N/A;    reports that he has quit smoking. His smoking use included Cigarettes. He has a 15.00 pack-year smoking history. He has never used smokeless  tobacco. He reports that he uses drugs, including Marijuana. He reports that he does not drink alcohol. family history includes Heart disease in his father and mother; Kidney disease in his sister. No Known Allergies No current outpatient prescriptions on file prior to visit.   No current  facility-administered medications on file prior to visit.    Review of Systems Constitutional: Negative for other unusual diaphoresis, sweats, appetite or weight changes HENT: Negative for other worsening hearing loss, ear pain, facial swelling, mouth sores or neck stiffness.   Eyes: Negative for other worsening pain, redness or other visual disturbance.  Respiratory: Negative for other stridor or swelling Cardiovascular: Negative for other palpitations or other chest pain  Gastrointestinal: Negative for worsening diarrhea or loose stools, blood in stool, distention or other pain Genitourinary: Negative for hematuria, flank pain or other change in urine volume.  Musculoskeletal: Negative for myalgias or other joint swelling.  Skin: Negative for other color change, or other wound or worsening drainage.  Neurological: Negative for other syncope or numbness. Hematological: Negative for other adenopathy or swelling Psychiatric/Behavioral: Negative for hallucinations, other worsening agitation, SI, self-injury, or new decreased concentration All other system neg per pt    Objective:   Physical Exam BP 138/76   Pulse 96   Temp 97.6 F (36.4 C) (Oral)   Ht 6\' 4"  (1.93 m)   Wt 166 lb (75.3 kg)   SpO2 98%   BMI 20.21 kg/m  VS noted,  Constitutional: Pt is oriented to person, place, and time. Appears well-developed and well-nourished, in no significant distress and comfortable Head: Normocephalic and atraumatic  Eyes: Conjunctivae and EOM are normal. Pupils are equal, round, and reactive to light Right Ear: External ear normal without discharge Left Ear: External ear normal without discharge Nose: Nose without discharge or deformity Mouth/Throat: Oropharynx is without other ulcerations and moist  Neck: Normal range of motion. Neck supple. No JVD present. No tracheal deviation present or significant neck LA or mass Cardiovascular: Normal rate, regular rhythm, normal heart sounds and intact  distal pulses.   Pulmonary/Chest: WOB normal and breath sounds without rales or wheezing  Abdominal: Soft. Bowel sounds are normal. NT. No HSM  Musculoskeletal: Normal range of motion. Exhibits no edema Lymphadenopathy: Has no other cervical adenopathy.  Neurological: Pt is alert and oriented to person, place, and time. Pt has normal reflexes. No cranial nerve deficit. Motor grossly intact, Gait intact Skin: Skin is warm and dry. No rash noted or new ulcerations Psychiatric:  Has nervous mood and affect. Behavior is normal without agitation No other exam findings Lab Results  Component Value Date   WBC 8.7 12/20/2016   HGB 11.4 (L) 12/20/2016   HCT 34.3 (L) 12/20/2016   PLT 199.0 12/20/2016   GLUCOSE 110 (H) 12/20/2016   CHOL 200 12/20/2016   TRIG 152.0 (H) 12/20/2016   HDL 83.40 12/20/2016   LDLDIRECT 80.8 11/18/2013   LDLCALC 87 12/20/2016   ALT 19 12/20/2016   AST 29 12/20/2016   NA 136 12/20/2016   K 4.5 12/20/2016   CL 104 12/20/2016   CREATININE 1.31 12/20/2016   BUN 16 12/20/2016   CO2 25 12/20/2016   TSH 1.19 12/20/2016   PSA 7.97 (H) 12/20/2016   INR 0.98 07/31/2016   HGBA1C 4.6 12/16/2015   MICROALBUR 1.5 08/25/2015       Assessment & Plan:

## 2016-12-23 NOTE — Assessment & Plan Note (Signed)
stable overall by history and exam, and pt to continue medical treatment as before,  to f/u any worsening symptoms or concerns 

## 2016-12-23 NOTE — Assessment & Plan Note (Signed)
Here for wellness and f/u;  Overall doing ok;  Pt denies Chest pain, worsening SOB, DOE, wheezing, orthopnea, PND, worsening LE edema, palpitations, dizziness or syncope.  Pt denies neurological change such as new headache, facial or extremity weakness.  Pt denies polydipsia, polyuria, or low sugar symptoms. Pt states overall good compliance with treatment and medications, good tolerability, and has been trying to follow appropriate diet.  Pt denies worsening depressive symptoms, suicidal ideation or panic. No fever, night sweats, wt loss, loss of appetite, or other constitutional symptoms.  Pt states good ability with ADL's, has low fall risk, home safety reviewed and adequate, no other significant changes in hearing or vision, and only occasionally active with exercise.   

## 2016-12-23 NOTE — Assessment & Plan Note (Signed)
stable overall by history and exam, recent data reviewed with pt, and pt to continue medical treatment as before,  to f/u any worsening symptoms or concerns  

## 2016-12-25 ENCOUNTER — Telehealth: Payer: Self-pay

## 2016-12-25 NOTE — Telephone Encounter (Signed)
Called pt, no contact after multiple attempts.   Notes recorded by Juliet Rude, CMA on 12/25/2016 at 2:45 PM EST Called pt, LVM ------  Notes recorded by Juliet Rude, CMA on 12/22/2016 at 1:04 PM EDT Called pt, LVM ------  Notes recorded by Juliet Rude, CMA on 12/21/2016 at 1:24 PM EDT Called pt, LVM

## 2016-12-25 NOTE — Telephone Encounter (Signed)
-----   Message from Biagio Borg, MD sent at 12/21/2016 12:43 PM EDT ----- Left message on MyChart, pt to cont same tx except  The test results show that your current treatment is OK, except the PSA is quite elevated, more the time before when it was high.  This may be due to infection, so I will send an antibiotic to your pharmacy, and you should hear from the office as well  Please return to the LAB only (just go to the LAB) in 4 weeks for a repeat PSA  Shirron to please inform pt, I will do rx and order

## 2017-02-12 ENCOUNTER — Other Ambulatory Visit: Payer: Self-pay | Admitting: Internal Medicine

## 2017-06-13 ENCOUNTER — Other Ambulatory Visit: Payer: Self-pay

## 2017-06-13 ENCOUNTER — Other Ambulatory Visit (INDEPENDENT_AMBULATORY_CARE_PROVIDER_SITE_OTHER): Payer: 59

## 2017-06-13 ENCOUNTER — Emergency Department (HOSPITAL_COMMUNITY): Payer: 59

## 2017-06-13 ENCOUNTER — Inpatient Hospital Stay (HOSPITAL_COMMUNITY): Payer: 59

## 2017-06-13 ENCOUNTER — Encounter: Payer: Self-pay | Admitting: Internal Medicine

## 2017-06-13 ENCOUNTER — Telehealth: Payer: Self-pay | Admitting: Internal Medicine

## 2017-06-13 ENCOUNTER — Encounter (HOSPITAL_COMMUNITY): Payer: Self-pay

## 2017-06-13 ENCOUNTER — Ambulatory Visit: Payer: Self-pay | Admitting: *Deleted

## 2017-06-13 ENCOUNTER — Inpatient Hospital Stay (HOSPITAL_COMMUNITY)
Admission: EM | Admit: 2017-06-13 | Discharge: 2017-06-29 | DRG: 682 | Disposition: A | Discharge status: Home / Self Care | Payer: 59 | Attending: Internal Medicine | Admitting: Internal Medicine

## 2017-06-13 ENCOUNTER — Ambulatory Visit (INDEPENDENT_AMBULATORY_CARE_PROVIDER_SITE_OTHER): Payer: 59 | Admitting: Internal Medicine

## 2017-06-13 VITALS — BP 106/70 | Temp 98.1°F | Ht 76.0 in | Wt 165.0 lb

## 2017-06-13 DIAGNOSIS — J449 Chronic obstructive pulmonary disease, unspecified: Secondary | ICD-10-CM

## 2017-06-13 DIAGNOSIS — R2689 Other abnormalities of gait and mobility: Secondary | ICD-10-CM | POA: Insufficient documentation

## 2017-06-13 DIAGNOSIS — N400 Enlarged prostate without lower urinary tract symptoms: Secondary | ICD-10-CM | POA: Diagnosis not present

## 2017-06-13 DIAGNOSIS — R972 Elevated prostate specific antigen [PSA]: Secondary | ICD-10-CM

## 2017-06-13 DIAGNOSIS — E785 Hyperlipidemia, unspecified: Secondary | ICD-10-CM | POA: Diagnosis present

## 2017-06-13 DIAGNOSIS — F10239 Alcohol dependence with withdrawal, unspecified: Secondary | ICD-10-CM | POA: Diagnosis present

## 2017-06-13 DIAGNOSIS — R739 Hyperglycemia, unspecified: Secondary | ICD-10-CM | POA: Diagnosis not present

## 2017-06-13 DIAGNOSIS — Z841 Family history of disorders of kidney and ureter: Secondary | ICD-10-CM

## 2017-06-13 DIAGNOSIS — K449 Diaphragmatic hernia without obstruction or gangrene: Secondary | ICD-10-CM | POA: Diagnosis present

## 2017-06-13 DIAGNOSIS — Z86010 Personal history of colonic polyps: Secondary | ICD-10-CM

## 2017-06-13 DIAGNOSIS — R7989 Other specified abnormal findings of blood chemistry: Secondary | ICD-10-CM | POA: Diagnosis present

## 2017-06-13 DIAGNOSIS — D638 Anemia in other chronic diseases classified elsewhere: Secondary | ICD-10-CM | POA: Diagnosis present

## 2017-06-13 DIAGNOSIS — N170 Acute kidney failure with tubular necrosis: Secondary | ICD-10-CM | POA: Diagnosis present

## 2017-06-13 DIAGNOSIS — K219 Gastro-esophageal reflux disease without esophagitis: Secondary | ICD-10-CM | POA: Diagnosis present

## 2017-06-13 DIAGNOSIS — D649 Anemia, unspecified: Secondary | ICD-10-CM

## 2017-06-13 DIAGNOSIS — K7200 Acute and subacute hepatic failure without coma: Secondary | ICD-10-CM | POA: Diagnosis present

## 2017-06-13 DIAGNOSIS — R5383 Other fatigue: Secondary | ICD-10-CM

## 2017-06-13 DIAGNOSIS — I129 Hypertensive chronic kidney disease with stage 1 through stage 4 chronic kidney disease, or unspecified chronic kidney disease: Secondary | ICD-10-CM | POA: Diagnosis present

## 2017-06-13 DIAGNOSIS — K921 Melena: Secondary | ICD-10-CM | POA: Diagnosis not present

## 2017-06-13 DIAGNOSIS — Z86718 Personal history of other venous thrombosis and embolism: Secondary | ICD-10-CM | POA: Diagnosis not present

## 2017-06-13 DIAGNOSIS — Z86711 Personal history of pulmonary embolism: Secondary | ICD-10-CM | POA: Diagnosis present

## 2017-06-13 DIAGNOSIS — R42 Dizziness and giddiness: Secondary | ICD-10-CM | POA: Diagnosis not present

## 2017-06-13 DIAGNOSIS — D631 Anemia in chronic kidney disease: Secondary | ICD-10-CM | POA: Diagnosis not present

## 2017-06-13 DIAGNOSIS — N138 Other obstructive and reflux uropathy: Secondary | ICD-10-CM | POA: Diagnosis present

## 2017-06-13 DIAGNOSIS — E860 Dehydration: Secondary | ICD-10-CM | POA: Diagnosis present

## 2017-06-13 DIAGNOSIS — N401 Enlarged prostate with lower urinary tract symptoms: Secondary | ICD-10-CM | POA: Diagnosis present

## 2017-06-13 DIAGNOSIS — Z95828 Presence of other vascular implants and grafts: Secondary | ICD-10-CM

## 2017-06-13 DIAGNOSIS — D62 Acute posthemorrhagic anemia: Secondary | ICD-10-CM | POA: Diagnosis present

## 2017-06-13 DIAGNOSIS — N179 Acute kidney failure, unspecified: Secondary | ICD-10-CM | POA: Diagnosis not present

## 2017-06-13 DIAGNOSIS — N183 Chronic kidney disease, stage 3 unspecified: Secondary | ICD-10-CM | POA: Diagnosis present

## 2017-06-13 DIAGNOSIS — Z8673 Personal history of transient ischemic attack (TIA), and cerebral infarction without residual deficits: Secondary | ICD-10-CM | POA: Diagnosis not present

## 2017-06-13 DIAGNOSIS — I2510 Atherosclerotic heart disease of native coronary artery without angina pectoris: Secondary | ICD-10-CM | POA: Diagnosis present

## 2017-06-13 DIAGNOSIS — R945 Abnormal results of liver function studies: Secondary | ICD-10-CM | POA: Diagnosis present

## 2017-06-13 DIAGNOSIS — I959 Hypotension, unspecified: Secondary | ICD-10-CM | POA: Diagnosis present

## 2017-06-13 DIAGNOSIS — Z7901 Long term (current) use of anticoagulants: Secondary | ICD-10-CM | POA: Diagnosis not present

## 2017-06-13 DIAGNOSIS — Z79899 Other long term (current) drug therapy: Secondary | ICD-10-CM

## 2017-06-13 DIAGNOSIS — N184 Chronic kidney disease, stage 4 (severe): Secondary | ICD-10-CM | POA: Diagnosis present

## 2017-06-13 DIAGNOSIS — I10 Essential (primary) hypertension: Secondary | ICD-10-CM

## 2017-06-13 DIAGNOSIS — H9191 Unspecified hearing loss, right ear: Secondary | ICD-10-CM

## 2017-06-13 DIAGNOSIS — K5730 Diverticulosis of large intestine without perforation or abscess without bleeding: Secondary | ICD-10-CM | POA: Diagnosis present

## 2017-06-13 DIAGNOSIS — F1010 Alcohol abuse, uncomplicated: Secondary | ICD-10-CM | POA: Diagnosis not present

## 2017-06-13 DIAGNOSIS — IMO0001 Reserved for inherently not codable concepts without codable children: Secondary | ICD-10-CM | POA: Diagnosis present

## 2017-06-13 DIAGNOSIS — D696 Thrombocytopenia, unspecified: Secondary | ICD-10-CM | POA: Diagnosis present

## 2017-06-13 DIAGNOSIS — N189 Chronic kidney disease, unspecified: Secondary | ICD-10-CM | POA: Diagnosis not present

## 2017-06-13 DIAGNOSIS — E872 Acidosis: Secondary | ICD-10-CM | POA: Diagnosis present

## 2017-06-13 DIAGNOSIS — E861 Hypovolemia: Secondary | ICD-10-CM | POA: Diagnosis present

## 2017-06-13 DIAGNOSIS — R3915 Urgency of urination: Secondary | ICD-10-CM | POA: Diagnosis present

## 2017-06-13 DIAGNOSIS — R310 Gross hematuria: Secondary | ICD-10-CM | POA: Diagnosis present

## 2017-06-13 DIAGNOSIS — I739 Peripheral vascular disease, unspecified: Secondary | ICD-10-CM | POA: Diagnosis present

## 2017-06-13 DIAGNOSIS — Z832 Family history of diseases of the blood and blood-forming organs and certain disorders involving the immune mechanism: Secondary | ICD-10-CM

## 2017-06-13 DIAGNOSIS — H9193 Unspecified hearing loss, bilateral: Secondary | ICD-10-CM | POA: Diagnosis present

## 2017-06-13 DIAGNOSIS — Z8249 Family history of ischemic heart disease and other diseases of the circulatory system: Secondary | ICD-10-CM

## 2017-06-13 DIAGNOSIS — Z87891 Personal history of nicotine dependence: Secondary | ICD-10-CM

## 2017-06-13 DIAGNOSIS — J439 Emphysema, unspecified: Secondary | ICD-10-CM | POA: Diagnosis present

## 2017-06-13 DIAGNOSIS — N200 Calculus of kidney: Secondary | ICD-10-CM | POA: Diagnosis present

## 2017-06-13 DIAGNOSIS — N529 Male erectile dysfunction, unspecified: Secondary | ICD-10-CM | POA: Diagnosis present

## 2017-06-13 LAB — URINALYSIS, ROUTINE W REFLEX MICROSCOPIC
Bilirubin Urine: NEGATIVE
Glucose, UA: NEGATIVE mg/dL
Ketones, ur: NEGATIVE mg/dL
Leukocytes, UA: NEGATIVE
Nitrite: NEGATIVE
Protein, ur: 100 mg/dL — AB
RBC / HPF: >50 RBC/hpf — ABNORMAL HIGH (ref 0–5)
Specific Gravity, Urine: 1.009 (ref 1.005–1.030)
pH: 5 (ref 5.0–8.0)

## 2017-06-13 LAB — BASIC METABOLIC PANEL
ANION GAP: 9 (ref 5–15)
BUN: 84 mg/dL — AB (ref 6–23)
BUN: 93 mg/dL — AB (ref 6–20)
CO2: 18 meq/L — AB (ref 19–32)
CO2: 18 mmol/L — ABNORMAL LOW (ref 22–32)
Calcium: 8.1 mg/dL — ABNORMAL LOW (ref 8.4–10.5)
Calcium: 8.2 mg/dL — ABNORMAL LOW (ref 8.9–10.3)
Chloride: 103 mEq/L (ref 96–112)
Chloride: 106 mmol/L (ref 101–111)
Creatinine, Ser: 10.59 mg/dL (ref 0.40–1.50)
Creatinine, Ser: 11.16 mg/dL — ABNORMAL HIGH (ref 0.61–1.24)
GFR, EST AFRICAN AMERICAN: 5 mL/min — AB (ref >60)
GFR, EST NON AFRICAN AMERICAN: 4 mL/min — AB (ref >60)
GFR: 6.34 mL/min — CL (ref >60.00)
GLUCOSE: 148 mg/dL — AB (ref 70–99)
Glucose, Bld: 105 mg/dL — ABNORMAL HIGH (ref 65–99)
POTASSIUM: 3.7 meq/L (ref 3.5–5.1)
POTASSIUM: 3.9 mmol/L (ref 3.5–5.1)
SODIUM: 133 mmol/L — AB (ref 135–145)
Sodium: 134 mEq/L — ABNORMAL LOW (ref 135–145)

## 2017-06-13 LAB — ACETAMINOPHEN LEVEL

## 2017-06-13 LAB — TROPONIN I: Troponin I: <0.03 ng/mL (ref <0.03)

## 2017-06-13 LAB — CBC WITH DIFFERENTIAL/PLATELET
BASOS PCT: 0.4 % (ref 0.0–3.0)
Basophils Absolute: 0 10*3/uL (ref 0.0–0.1)
Eosinophils Absolute: 0.1 10*3/uL (ref 0.0–0.7)
Eosinophils Relative: 0.8 % (ref 0.0–5.0)
LYMPHS PCT: 17.5 % (ref 12.0–46.0)
Lymphs Abs: 1.5 10*3/uL (ref 0.7–4.0)
MCHC: 35 g/dL (ref 30.0–36.0)
MCV: 101.1 fl — ABNORMAL HIGH (ref 78.0–100.0)
MONO ABS: 1.2 10*3/uL — AB (ref 0.1–1.0)
Monocytes Relative: 13.9 % — ABNORMAL HIGH (ref 3.0–12.0)
Neutro Abs: 5.8 10*3/uL (ref 1.4–7.7)
Neutrophils Relative %: 67.4 % (ref 43.0–77.0)
Platelets: 62 10*3/uL — ABNORMAL LOW (ref 150.0–400.0)
RDW: 14.4 % (ref 11.5–15.5)
WBC: 8.5 10*3/uL (ref 4.0–10.5)

## 2017-06-13 LAB — LIPID PANEL
CHOLESTEROL: 157 mg/dL (ref 0–200)
HDL: 22.7 mg/dL — ABNORMAL LOW (ref >39.00)
NonHDL: 134.41
Total CHOL/HDL Ratio: 7
Triglycerides: 343 mg/dL — ABNORMAL HIGH (ref 0.0–149.0)
VLDL: 68.6 mg/dL — AB (ref 0.0–40.0)

## 2017-06-13 LAB — PROTIME-INR
INR: 1.4
PROTHROMBIN TIME: 17 s — AB (ref 11.4–15.2)

## 2017-06-13 LAB — HEPATIC FUNCTION PANEL
ALBUMIN: 3 g/dL — AB (ref 3.5–5.0)
ALK PHOS: 94 U/L (ref 39–117)
ALT: 474 U/L — ABNORMAL HIGH (ref 0–53)
ALT: 450 U/L — ABNORMAL HIGH (ref 17–63)
AST: 335 U/L — ABNORMAL HIGH (ref 0–37)
AST: 259 U/L — AB (ref 15–41)
Albumin: 3 g/dL — ABNORMAL LOW (ref 3.5–5.2)
Alkaline Phosphatase: 84 U/L (ref 38–126)
BILIRUBIN DIRECT: 0.9 mg/dL — AB (ref 0.0–0.3)
BILIRUBIN TOTAL: 2 mg/dL — AB (ref 0.2–1.2)
BILIRUBIN TOTAL: 1.9 mg/dL — AB (ref 0.3–1.2)
Bilirubin, Direct: 0.7 mg/dL — ABNORMAL HIGH (ref 0.1–0.5)
Indirect Bilirubin: 1.2 mg/dL — ABNORMAL HIGH (ref 0.3–0.9)
Total Protein: 6 g/dL (ref 6.0–8.3)
Total Protein: 7.1 g/dL (ref 6.5–8.1)

## 2017-06-13 LAB — DIRECT ANTIGLOBULIN TEST (NOT AT ARMC)
DAT, COMPLEMENT: NEGATIVE
DAT, IgG: NEGATIVE

## 2017-06-13 LAB — PROTEIN / CREATININE RATIO, URINE
CREATININE, URINE: 94.4 mg/dL
Protein Creatinine Ratio: 1.3 mg/mg{Cre} — ABNORMAL HIGH (ref 0.00–0.15)
TOTAL PROTEIN, URINE: 123 mg/dL

## 2017-06-13 LAB — PSA: PSA: 2.36 ng/mL (ref 0.10–4.00)

## 2017-06-13 LAB — FOLATE: FOLATE: 15.4 ng/mL (ref >5.9)

## 2017-06-13 LAB — BILIRUBIN, FRACTIONATED(TOT/DIR/INDIR)
BILIRUBIN INDIRECT: 0.9 mg/dL (ref 0.3–0.9)
Bilirubin, Direct: 0.7 mg/dL — ABNORMAL HIGH (ref 0.1–0.5)
Total Bilirubin: 1.6 mg/dL — ABNORMAL HIGH (ref 0.3–1.2)

## 2017-06-13 LAB — RETICULOCYTES
RBC.: 2.17 MIL/uL — AB (ref 4.22–5.81)
RETIC COUNT ABSOLUTE: 65.1 10*3/uL (ref 19.0–186.0)
Retic Ct Pct: 3 % (ref 0.4–3.1)

## 2017-06-13 LAB — CBC
HCT: 20.4 % — ABNORMAL LOW (ref 39.0–52.0)
Hemoglobin: 7.3 g/dL — ABNORMAL LOW (ref 13.0–17.0)
MCH: 34.8 pg — ABNORMAL HIGH (ref 26.0–34.0)
MCHC: 35.8 g/dL (ref 30.0–36.0)
MCV: 97.1 fL (ref 78.0–100.0)
Platelets: 62 10*3/uL — ABNORMAL LOW (ref 150–400)
RBC: 2.1 MIL/uL — AB (ref 4.22–5.81)
RDW: 13.9 % (ref 11.5–15.5)
WBC: 9.6 10*3/uL (ref 4.0–10.5)

## 2017-06-13 LAB — SODIUM, URINE, RANDOM: Sodium, Ur: 33 mmol/L

## 2017-06-13 LAB — CREATININE, URINE, RANDOM: Creatinine, Urine: 94.07 mg/dL

## 2017-06-13 LAB — SALICYLATE LEVEL: Salicylate Lvl: <7 mg/dL (ref 2.8–30.0)

## 2017-06-13 LAB — CK: CK TOTAL: 119 U/L (ref 49–397)

## 2017-06-13 LAB — LDL CHOLESTEROL, DIRECT: LDL DIRECT: 91 mg/dL

## 2017-06-13 LAB — POC OCCULT BLOOD, ED: Fecal Occult Bld: POSITIVE — AB

## 2017-06-13 LAB — PREPARE RBC (CROSSMATCH)

## 2017-06-13 LAB — ABO/RH: ABO/RH(D): B POS

## 2017-06-13 LAB — TSH: TSH: 3.06 u[IU]/mL (ref 0.35–4.50)

## 2017-06-13 LAB — HEMOGLOBIN A1C: HEMOGLOBIN A1C: 4.6 % (ref 4.6–6.5)

## 2017-06-13 MED ORDER — HYDROCODONE-ACETAMINOPHEN 5-325 MG PO TABS: 1.0000 | ORAL_TABLET | ORAL | Status: DC | PRN

## 2017-06-13 MED ORDER — ROSUVASTATIN CALCIUM 20 MG PO TABS: 40.0000 mg | ORAL_TABLET | Freq: Every day | ORAL | Status: DC

## 2017-06-13 MED ORDER — LORAZEPAM 2 MG/ML IJ SOLN: 1.0000 mg | Freq: Four times a day (QID) | INTRAMUSCULAR | Status: AC | PRN

## 2017-06-13 MED ORDER — ONDANSETRON HCL 4 MG PO TABS: 4.0000 mg | ORAL_TABLET | Freq: Four times a day (QID) | ORAL | Status: DC | PRN

## 2017-06-13 MED ORDER — FOLIC ACID 1 MG PO TABS
1.0000 mg | ORAL_TABLET | Freq: Every day | ORAL | Status: DC
  Administered 2017-06-14 – 2017-06-18 (×5): 1 mg via ORAL
  Filled 2017-06-13 (×5): qty 1

## 2017-06-13 MED ORDER — LORAZEPAM 1 MG PO TABS: 1.0000 mg | ORAL_TABLET | Freq: Four times a day (QID) | ORAL | Status: AC | PRN

## 2017-06-13 MED ORDER — ONDANSETRON HCL 4 MG/2ML IJ SOLN: 4.0000 mg | Freq: Four times a day (QID) | INTRAMUSCULAR | Status: DC | PRN

## 2017-06-13 MED ORDER — SODIUM CHLORIDE 0.9 % IV BOLUS: 1000.0000 mL | Freq: Once | INTRAVENOUS | Status: DC

## 2017-06-13 MED ORDER — THIAMINE HCL 100 MG/ML IJ SOLN: 100.0000 mg | INTRAMUSCULAR | Status: DC

## 2017-06-13 MED ORDER — ADULT MULTIVITAMIN W/MINERALS CH
1.0000 | ORAL_TABLET | Freq: Every day | ORAL | Status: DC
  Administered 2017-06-14 – 2017-06-29 (×16): 1 via ORAL
  Filled 2017-06-13 (×16): qty 1

## 2017-06-13 MED ORDER — ACETAMINOPHEN 325 MG PO TABS: 650.0000 mg | ORAL_TABLET | Freq: Four times a day (QID) | ORAL | Status: DC | PRN

## 2017-06-13 MED ORDER — FAMOTIDINE IN NACL 20-0.9 MG/50ML-% IV SOLN
20.0000 mg | Freq: Two times a day (BID) | INTRAVENOUS | Status: DC
Start: 2017-06-13 — End: 2017-06-14
  Administered 2017-06-13: 20 mg via INTRAVENOUS
  Filled 2017-06-13: qty 50

## 2017-06-13 MED ORDER — SODIUM CHLORIDE 0.9 % IV SOLN
INTRAVENOUS | Status: AC
  Administered 2017-06-13: 23:00:00 via INTRAVENOUS

## 2017-06-13 MED ORDER — VITAMIN B-1 100 MG PO TABS
100.0000 mg | ORAL_TABLET | Freq: Every day | ORAL | Status: DC
  Administered 2017-06-14 – 2017-06-29 (×16): 100 mg via ORAL
  Filled 2017-06-13 (×16): qty 1

## 2017-06-13 MED ORDER — SODIUM CHLORIDE 0.9 % IV SOLN: 10.0000 mL/h | Freq: Once | INTRAVENOUS | Status: DC

## 2017-06-13 MED ORDER — ACETAMINOPHEN 650 MG RE SUPP: 650.0000 mg | Freq: Four times a day (QID) | RECTAL | Status: DC | PRN

## 2017-06-13 MED ORDER — THIAMINE HCL 100 MG/ML IJ SOLN
100.0000 mg | Freq: Every day | INTRAMUSCULAR | Status: DC
  Filled 2017-06-13: qty 2

## 2017-06-13 NOTE — Telephone Encounter (Signed)
Pt has been informed and expressed understanding.  

## 2017-06-13 NOTE — Consult Note (Signed)
[facility]  HISTORY AND PHYSICAL  Charles [name] is an 67 y.o. male.    Chief Complaint: Abnormal labs   HPI: t is a 4M with a PMH significant for HTN, h/o DVT/ PE on Xarelto, h/o acute PE in 2018 in the setting of stopped AC s/p hemopneumothorax, EtOH abuse, PAD, and CKD 3a who is now seen in consultation at the request of Dr. Leonette Monarch for eval and recs re; AKI.   Pt was in his usual state of health until 4 days ago- started having some increased "wobbliness" and so presented to his PCP.  Had labs yesterday that revealed a Cr of 10.59, BUN of 84, Cr of 3.7, AST 335, ALT 474, Tbili 2.0, Hgb 8.2.  In this setting, he was referred to ED where labs were similar.  Last known Cr 1.20 December 2016.  Pt reports he normally drinks about 1/2 pint of liquor daily but hasn't had any in 4 days.  Hasn't had any seizures ever from EtOH withdrawal.  Has been getting up every 30-45 min each night to urinate- says they are " little spurts" of urine and not really good voids.    Hasn't consumed excessive Tylenol, ibuprofen.  No ASA.  Hasn't been drinking any homemade EtOH, antifreeze, mouthwash.    No CP, HA, SOB, n/v, LE edema, unexplained rashes.  Had a nosebleed x 2 in the past 2 days.  No oral ulcers.  Sister had lupus but no other history of autoimmune disease.   PMH: Past Medical History:  Diagnosis Date  . ABNORMAL ELECTROCARDIOGRAM 11/02/2008  . ABSCESS, LUNG 10/23/2006  . Anemia 05/26/2014  . BACTERIAL PNEUMONIA 12/28/2009  . BPH (benign prostatic hyperplasia) 11/22/2010  . 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 CHEST PAIN 12/24/2009  . Coronary artery calcification seen on CT scan 06/01/2015  . Cramp of limb 07/19/2007  . DIVERTICULOSIS, COLON 03/07/2007  . DVT (deep venous thrombosis) ([place]) 01/2014   LLE  . Emphysema of lung ([place])   . 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/23/2006  . FLANK PAIN, LEFT 02/25/2010  . FREQUENCY, URINARY 12/24/2009  . HEMORRHOIDS 10/23/2006  . HYPERLIPIDEMIA 10/23/2006  .  HYPERTENSION 10/23/2006  . PE (pulmonary embolism) 02/2014  . PLANTAR FASCIITIS, LEFT 11/02/2008  . Pneumonia 12/2013 X 2  . PSA, INCREASED 11/20/2007  . PULMONARY NODULE 05/14/2008  . Unspecified Peripheral Vascular Disease 10/23/2006   PSH: Past Surgical History:  Procedure Laterality Date  . HEMORRHOID SURGERY  ?1990's  . INGUINAL HERNIA REPAIR Bilateral ?2000's  . IVC FILTER INSERTION N/A 10/03/2016   Procedure: IVC Filter Retrieveal;  Surgeon: Serafina Mitchell, MD;  Location: [facility];  Service: Cardiovascular;  Laterality: N/A;  . SHOULDER ARTHROSCOPY W/ ROTATOR CUFF REPAIR Right 11/2013  . STAPLING OF BLEBS Right 07/31/2016   Procedure: BLEBECTOMY;  Surgeon: Melrose Nakayama, MD;  Location: [facility];  Service: Thoracic;  Laterality: Right;  . VENA CAVA FILTER PLACEMENT Right 08/04/2016   Procedure: INSERTION VENA-CAVA FILTER;  Surgeon: Melrose Nakayama, MD;  Location: [facility];  Service: Thoracic;  Laterality: Right;  Marland Kitchen VIDEO ASSISTED THORACOSCOPY Right 07/31/2016   Procedure: VIDEO ASSISTED THORACOSCOPY;  Surgeon: Melrose Nakayama, MD;  Location: [facility];  Service: Thoracic;  Laterality: Right;  Marland Kitchen VIDEO ASSISTED THORACOSCOPY Right 08/04/2016   Procedure: REDO VIDEO ASSISTED THORACOSCOPY- EVACUATION OF HEMOTHORAX;  Surgeon: Melrose Nakayama, MD;  Location: [facility];  Service: Thoracic;  Laterality: Right;  Marland Kitchen VIDEO BRONCHOSCOPY WITH ENDOBRONCHIAL NAVIGATION N/A 03/11/2014  Procedure: VIDEO BRONCHOSCOPY WITH ENDOBRONCHIAL NAVIGATION;  Surgeon: Collene Gobble, MD;  [facility];  Service: Thoracic;  Laterality: N/A;    Medications:   Scheduled: . thiamine injection  100 mg Intravenous Q24H     (Not in a hospital admission)  ALLERGIES:  No Known Allergies  FAM HX: Family History  Problem Relation Age of Onset  . Heart disease Mother   . Heart disease Father   . Kidney disease Sister        Renal transplant  . Colon cancer Neg Hx     Social History:    reports that he has quit smoking. His smoking use included cigarettes. He has a 15.00 pack-year smoking history. He has never used smokeless tobacco. He reports that he drinks alcohol. He reports that he has current or past drug history. Drug: Marijuana.  ROS: ROS: all other systems reviewed and are negative except as per HPI  Blood pressure 111/70, pulse 81, temperature 98.3 F (36.8 C), temperature source Oral, resp. rate (!) 21, SpO2 100 %. PHYSICAL EXAM: Physical Exam  GEN NAD, sitting in bed with wife at bedside HEENT EOMI PERRL NECK no JVD or carotid bruits PULM clear bilaterally no c/w/r CV RRR no m/r/g ABD soft, nontender, + suprapubic tenderness with + urinary urgency on palpation EXT no LE edema NEURO AAO x 3 nonfocal no asterixis SKIN some hyperpigmented areas on the feet and ankles which pt reports are chronic and unchanged   Results for orders placed or performed during the hospital encounter of 06/13/17 (from the past 48 hour(s))  Basic metabolic panel     Status: Abnormal   Collection Time: 06/13/17  5:36 PM  Result Value Ref Range   Sodium 133 (L) 135 - 145 mmol/L   Potassium 3.9 3.5 - 5.1 mmol/L   Chloride 106 101 - 111 mmol/L   CO2 18 (L) 22 - 32 mmol/L   Glucose, Bld 105 (H) 65 - 99 mg/dL   BUN 93 (H) 6 - 20 mg/dL   Creatinine, Ser 11.16 (H) 0.61 - 1.24 mg/dL   Calcium 8.2 (L) 8.9 - 10.3 mg/dL   GFR calc non Af Amer 4 (L) >60 mL/min   GFR calc Af Amer 5 (L) >60 mL/min    Comment: (NOTE) The eGFR has been calculated using the CKD EPI equation. This calculation has not been validated in all clinical situations. eGFR's persistently <60 mL/min signify possible Chronic Kidney Disease.    Anion gap 9 5 - 15    Comment: Performed at [facility], [place] [address] [place], [place] [address]  CBC     Status: Abnormal   Collection Time: 06/13/17  5:36 PM  Result Value Ref Range   WBC 9.6 4.0 - 10.5 K/uL   RBC 2.10 (L) 4.22 - 5.81 MIL/uL    Hemoglobin 7.3 (L) 13.0 - 17.0 g/dL   HCT 20.4 (L) 39.0 - 52.0 %   MCV 97.1 78.0 - 100.0 fL   MCH 34.8 (H) 26.0 - 34.0 pg   MCHC 35.8 30.0 - 36.0 g/dL   RDW 13.9 11.5 - 15.5 %   Platelets 62 (L) 150 - 400 K/uL    Comment: SPECIMEN CHECKED FOR CLOTS REPEATED TO VERIFY PLATELET COUNT CONFIRMED BY SMEAR Performed at [facility] [address] [place], [place] [address]   Reticulocytes     Status: Abnormal   Collection Time: 06/13/17  7:21 PM  Result Value Ref Range   Retic  Ct Pct 3.0 0.4 - 3.1 %   RBC. 2.17 (L) 4.22 - 5.81 MIL/uL   Retic Count, Absolute 65.1 19.0 - 186.0 K/uL    Comment: Performed at [facility], [place] [address], [place], [place] [address]  Hepatic function panel     Status: Abnormal   Collection Time: 06/13/17  7:21 PM  Result Value Ref Range   Total Protein 7.1 6.5 - 8.1 g/dL   Albumin 3.0 (L) 3.5 - 5.0 g/dL   AST 259 (H) 15 - 41 U/L   ALT 450 (H) 17 - 63 U/L   Alkaline Phosphatase 84 38 - 126 U/L   Total Bilirubin 1.9 (H) 0.3 - 1.2 mg/dL   Bilirubin, Direct 0.7 (H) 0.1 - 0.5 mg/dL   Indirect Bilirubin 1.2 (H) 0.3 - 0.9 mg/dL    Comment: Performed at [facility], [place] [address], [place], [place] [address]  POC occult blood, ED Provider will collect     Status: Abnormal   Collection Time: 06/13/17  7:31 PM  Result Value Ref Range   Fecal Occult Bld POSITIVE (A) NEGATIVE  CK     Status: None   Collection Time: 06/13/17  8:03 PM  Result Value Ref Range   Total CK 119 49 - 397 U/L    Comment: Performed at [facility], [place] [address] [place], [place] [address]  Protime-INR     Status: Abnormal   Collection Time: 06/13/17  8:03 PM  Result Value Ref Range   Prothrombin Time 17.0 (H) 11.4 - 15.2 seconds   INR 1.40     Comment: Performed at [facility], [place] [address], [place], [place] [address]  Urinalysis, Routine w reflex microscopic- may I&O cath if menses      Status: Abnormal   Collection Time: 06/13/17  8:14 PM  Result Value Ref Range   Color, Urine YELLOW YELLOW   APPearance CLEAR CLEAR   Specific Gravity, Urine 1.009 1.005 - 1.030   pH 5.0 5.0 - 8.0   Glucose, UA NEGATIVE NEGATIVE mg/dL   Hgb urine dipstick LARGE (A) NEGATIVE   Bilirubin Urine NEGATIVE NEGATIVE   Ketones, ur NEGATIVE NEGATIVE mg/dL   Protein, ur 100 (A) NEGATIVE mg/dL   Nitrite NEGATIVE NEGATIVE   Leukocytes, UA NEGATIVE NEGATIVE   RBC / HPF >50 (H) 0 - 5 RBC/hpf   WBC, UA 21-50 0 - 5 WBC/hpf   Bacteria, UA RARE (A) NONE SEEN   Squamous Epithelial / LPF 0-5 0 - 5    Comment: Please note change in reference range. Performed at [facility], [place] [address] [place], [place] [address]     No results found.  Assessment/Plan  1.  AKI on CKD 3a: pt with dramatic AKI after fairly well-controlled CKD with last known normal in October 2018. UA with some hematuria and 21-50 WBCs.  Unclear etiology at present- CT abd/ pelvis pending, will add on renal dopplers..  ? Obstructive uropathy.  Other considerations include de novo GN, dont' think TTP involved but will rule out and work up for GN.  CK 119.  Strict I/ O, Foley cath, no indications for dialysis at present but will monitor closely.  2.  Anemia and thrombocytopenia: will add on diff, LDH, DAT, fractionate the bilirubin, go from there.  Getting blood transfusion.  3.  EtOH abuse: hasn't had a drink in 4 days.  Monitor for EtOH withdrawal  4.  Elevated LFTs: unclear at  present.  Adding Tylenol and salicylate level; CT read pending.  Would consider hepatitis panel as well.  5.  Metabolic acidosis: non anion gap metabolic acidosis.  Likely due to renal failure, doesn't appear to be AG.     Madelon Lips 06/13/2017, 9:20 PM

## 2017-06-13 NOTE — Progress Notes (Signed)
Subjective:    Patient ID: Charles [name], male    DOB: 1950/06/09, 67 y.o.   MRN: [id number]  HPI  Here with c/o feeling off balance x 4 days, dizziness, has to hold on to walls to avoid falling, has slightly blurry vision, recent nosebleed x 1, also some right hearing muffled sounds in the past wk.  Pt denies chest pain, increased sob or doe, wheezing, orthopnea, PND, increased LE swelling, palpitations, or syncope.  Pt denies new neurological symptoms such as new headache, or facial or localizing extremity weakness or numbness   Pt denies polydipsia, polyuria Past Medical History:  Diagnosis Date  . ABNORMAL ELECTROCARDIOGRAM 11/02/2008  . ABSCESS, LUNG 10/23/2006  . Anemia 05/26/2014  . BACTERIAL PNEUMONIA 12/28/2009  . BPH (benign prostatic hyperplasia) 11/22/2010  . 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 CHEST PAIN 12/24/2009  . Coronary artery calcification seen on CT scan 06/01/2015  . Cramp of limb 07/19/2007  . DIVERTICULOSIS, COLON 03/07/2007  . DVT (deep venous thrombosis) ([place]) 01/2014   LLE  . Emphysema of lung ([place])   . 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/23/2006  . FLANK PAIN, LEFT 02/25/2010  . FREQUENCY, URINARY 12/24/2009  . HEMORRHOIDS 10/23/2006  . HYPERLIPIDEMIA 10/23/2006  . HYPERTENSION 10/23/2006  . PE (pulmonary embolism) 02/2014  . PLANTAR FASCIITIS, LEFT 11/02/2008  . Pneumonia 12/2013 X 2  . PSA, INCREASED 11/20/2007  . PULMONARY NODULE 05/14/2008  . Unspecified Peripheral Vascular Disease 10/23/2006   Past Surgical History:  Procedure Laterality Date  . HEMORRHOID SURGERY  ?1990's  . INGUINAL HERNIA REPAIR Bilateral ?2000's  . IVC FILTER INSERTION N/A 10/03/2016   Procedure: IVC Filter Retrieveal;  Surgeon: Serafina Mitchell, MD;  Location: [facility];  Service: Cardiovascular;  Laterality: N/A;  . SHOULDER ARTHROSCOPY W/ ROTATOR CUFF REPAIR Right 11/2013  . STAPLING OF BLEBS Right 07/31/2016   Procedure: BLEBECTOMY;  Surgeon: Melrose Nakayama, MD;  Location: [facility];  Service:  Thoracic;  Laterality: Right;  . VENA CAVA FILTER PLACEMENT Right 08/04/2016   Procedure: INSERTION VENA-CAVA FILTER;  Surgeon: Melrose Nakayama, MD;  Location: [facility];  Service: Thoracic;  Laterality: Right;  Marland Kitchen VIDEO ASSISTED THORACOSCOPY Right 07/31/2016   Procedure: VIDEO ASSISTED THORACOSCOPY;  Surgeon: Melrose Nakayama, MD;  Location: [facility];  Service: Thoracic;  Laterality: Right;  Marland Kitchen VIDEO ASSISTED THORACOSCOPY Right 08/04/2016   Procedure: REDO VIDEO ASSISTED THORACOSCOPY- EVACUATION OF HEMOTHORAX;  Surgeon: Melrose Nakayama, MD;  Location: [facility];  Service: Thoracic;  Laterality: Right;  Marland Kitchen VIDEO BRONCHOSCOPY WITH ENDOBRONCHIAL NAVIGATION N/A 03/11/2014   Procedure: VIDEO BRONCHOSCOPY WITH ENDOBRONCHIAL NAVIGATION;  Surgeon: Collene Gobble, MD;  Location: [facility];  Service: Thoracic;  Laterality: N/A;    reports that he has quit smoking. His smoking use included cigarettes. He has a 15.00 pack-year smoking history. He has never used smokeless tobacco. He reports that he drinks alcohol. He reports that he has current or past drug history. Drug: Marijuana. family history includes Heart disease in his father and mother; Kidney disease in his sister. No Known Allergies No current facility-administered medications on file prior to visit.    Current Outpatient Medications on File Prior to Visit  Medication Sig Dispense Refill  . amLODipine (NORVASC) 10 MG tablet Take 1 tablet (10 mg total) by mouth daily. 90 tablet 3  . ezetimibe (ZETIA) 10 MG tablet Take 1 tablet (10 mg total) by mouth daily. 90 tablet 3  . lisinopril (PRINIVIL,ZESTRIL) 20 MG tablet TAKE 1  TABLET DAILY 90 tablet 3  . rosuvastatin (CRESTOR) 40 MG tablet TAKE 1 TABLET DAILY 90 tablet 3  . rivaroxaban (XARELTO) 10 MG TABS tablet Take 1 tablet (10 mg total) by mouth daily. 90 tablet 3  . tadalafil (CIALIS) 20 MG tablet Take 1 tablet (20 mg total) by mouth daily as needed for erectile dysfunction. 10 tablet 11   Review of  Systems  Constitutional: Negative for other unusual diaphoresis or sweats HENT: Negative for ear discharge or swelling Eyes: Negative for other worsening visual disturbances Respiratory: Negative for stridor or other swelling  Gastrointestinal: Negative for worsening distension or other blood Genitourinary: Negative for retention or other urinary change Musculoskeletal: Negative for other MSK pain or swelling Skin: Negative for color change or other new lesions Neurological: Negative for worsening tremors and other numbness  Psychiatric/Behavioral: Negative for worsening agitation or other fatigue All other system neg per pt    Objective:   Physical Exam BP 106/70   Temp 98.1 F (36.7 C) (Oral)   Ht 6\' 4"  (1.93 m)   Wt 165 lb (74.8 kg)   BMI 20.08 kg/m  VS noted,  Constitutional: Pt appears in NAD HENT: Head: NCAT.  Right Ear: External ear normal.  Left Ear: External ear normal.  Eyes: . Pupils are equal, round, and reactive to light. Conjunctivae and EOM are normal, right TM with tube in place and mild erythema Nose: without d/c or deformity Neck: Neck supple. Gross normal ROM Cardiovascular: Normal rate and regular rhythm.   Pulmonary/Chest: Effort normal and breath sounds without rales or wheezing.  Abd:  Soft, NT, ND, + BS, no organomegaly Neurological: Pt is alert. At baseline orientation, motor grossly intact Skin: Skin is warm. No rashes, other new lesions, no LE edema Psychiatric: Pt behavior is normal without agitation  No other exam findings    Assessment & Plan:

## 2017-06-13 NOTE — ED Triage Notes (Signed)
Patient went to his PCP today and was told to come to the ED for elevated Creatinine level. Patient states he has decreased urine output. Patient states he has been having dizziness as well.

## 2017-06-13 NOTE — Telephone Encounter (Signed)
CRITICAL VALUE STICKER  CRITICAL VALUE: Creatinine 10.59,GFR 6.34   RECEIVER (on-site recipient of call): Almyra Free  DATE & TIME NOTIFIED: 06/13/17 at 2:27 pm MESSENGER (representative from lab):  MD NOTIFIED: Cathlean Cower  TIME OF NOTIFICATION:2:30 pm  RESPONSE:  Will follow up

## 2017-06-13 NOTE — Assessment & Plan Note (Signed)
For f/u psa - improved Lab Results  Component Value Date   PSA 2.36 06/13/2017   PSA 7.97 (H) 12/20/2016   PSA 1.54 08/25/2015

## 2017-06-13 NOTE — Patient Instructions (Addendum)
Please take all new medication as prescribed - the doxycycline that you have at home  Please continue all other medications as before, and refills have been done if requested.  Please have the pharmacy call with any other refills you may need.  Please keep your appointments with your specialists as you may have planned  You will be contacted regarding the referral for: ENT  Please go to the LAB in the Basement (turn left off the elevator) for the tests to be done today  You will be contacted by phone if any changes need to be made immediately.  Otherwise, you will receive a letter about your results with an explanation, but please check with MyChart first.  Please remember to sign up for MyChart if you have not done so, as this will be important to you in the future with finding out test results, communicating by private email, and scheduling acute appointments online when needed.  Please return as planned

## 2017-06-13 NOTE — Assessment & Plan Note (Signed)
stable overall by history and exam, recent data reviewed with pt, and pt to continue medical treatment as before,  to f/u any worsening symptoms or concerns BP Readings from Last 3 Encounters:  06/13/17 111/70  06/13/17 106/70  12/21/16 138/76

## 2017-06-13 NOTE — Assessment & Plan Note (Signed)
Ok for [employer] prn

## 2017-06-13 NOTE — Assessment & Plan Note (Signed)
stable overall by history and exam, recent data reviewed with pt, and pt to continue medical treatment as before,  to f/u any worsening symptoms or concerns Lab Results  Component Value Date   HGBA1C 4.6 06/13/2017

## 2017-06-13 NOTE — ED Provider Notes (Signed)
Medical screening examination/treatment/procedure(s) were conducted as a shared visit with non-physician practitioner(s) and myself.  I personally evaluated the patient during the encounter. Briefly, the patient is a 67 y.o. male with a history of daily alcohol use who presents to the emergency department with several days of fatigue.  Patient reported positive melena.  Patient was found to have renal failure and anemia.  Patient admitted for continued work-up and management.     EKG Interpretation  Date/Time:  Wednesday June 13 2017 20:57:41 EDT Ventricular Rate:  77 PR Interval:    QRS Duration: 107 QT Interval:  387 QTC Calculation: 438 R Axis:   52 Text Interpretation:  Sinus rhythm No significant change since last tracing Confirmed by Addison Lank [phone]) on 06/13/2017 9:16:44 PM         Cardama, Grayce Sessions, MD 06/13/17 2351

## 2017-06-13 NOTE — Telephone Encounter (Signed)
Called pt, LVM.   

## 2017-06-13 NOTE — Assessment & Plan Note (Signed)
Etiology unclear, for labs as ordered 

## 2017-06-13 NOTE — H&P (Signed)
Charles [name]:[id number] DOB: [birth year] DOA: 06/13/2017     PCP: Biagio Borg, MD   Outpatient Specialists:  NONE   Patient arrived to ER on 06/13/17 at 1532  Patient coming from:    home Lives With family    Chief Complaint:  Chief Complaint  Patient presents with  . Abnormal Lab    HPI: Charles [name] is a 67 y.o. male with medical history significant of COPD, BPH, CVA cerebellar, DVT sp IVC/PE  filter, HTN, HLD, spontaneous pneumothorax    Presented with fatigue and dyspnea for the past 4 days.Reported dark vomitus 4 days ago this AM noted melena today, decreased PO intake have hardly ate anything for the past 4 days. Went to his PCP and had lab work done. Cr 10.5 BUN 84 now Cr 11.6 BUN 93 at PCP office. Hg 8.2 now 7.3 K 3.9 On Xarelto for hx of DVT sp IVC  Hx of Et[employer] abuse Denies any fever or chills, no chest pain.   He used to drink 1/2 pint of vodka a day for past 10 days. He quit 5 days ago. He started to have shakes nausea and vomiting but thought it was all due to withdrawal and stayed home. He have only urinated twice a day He usually have been urinating at night very frequently but past few nights only once or twice and then not at all.   Denies dark urine.  Hx of family members on HD.  States if he needs HD he would be interested.  At some point he may have had mild CKD with baseline creatinine around 1.3  Reports last colonoscopy was with [facility] years ago unable to see this in epic  Regarding pertinent Chronic problems: Reports in June he had collapsed lung spontaneously. He has hx of reccurent PE/DVT and had IVC filter placed    While in ER:  Clay colored stool in rectal vault  but hemoccult positive Following Medications were ordered in ER: Medications  sodium chloride 0.9 % bolus 1,000 mL (has no administration in time range)  0.9 %  sodium chloride infusion (has no administration in time range)    Significant initial  Findings: Abnormal Labs  Reviewed  BASIC METABOLIC PANEL - Abnormal; Notable for the following components:      Result Value   Sodium 133 (*)    CO2 18 (*)    Glucose, Bld 105 (*)    BUN 93 (*)    Creatinine, Ser 11.16 (*)    Calcium 8.2 (*)    GFR calc non Af Amer 4 (*)    GFR calc Af Amer 5 (*)    All other components within normal limits  CBC - Abnormal; Notable for the following components:   RBC 2.10 (*)    Hemoglobin 7.3 (*)    HCT 20.4 (*)    MCH 34.8 (*)    Platelets 62 (*)    All other components within normal limits  POC OCCULT BLOOD, ED - Abnormal; Notable for the following components:   Fecal Occult Bld POSITIVE (*)    All other components within normal limits     Na 133 K 3.9  Cr     Up from baseline see below Lab Results  Component Value Date   CREATININE 11.16 (H) 06/13/2017   CREATININE 10.59 (HH) 06/13/2017   CREATININE 1.31 12/20/2016      WBC  9.6  HG/HCT Down   from baseline see below  Component Value Date/Time   HGB 7.3 (L) 06/13/2017 1736   HCT 20.4 (L) 06/13/2017 1736        BNP (last 3 results) Recent Labs    07/24/16 0326  BNP 165.6*   Elevated LFTs CK 119   UA  no evidence of UTI  heamturia     ECG:  Personally reviewed by me showing: HR : 77 Rhythm: NSR,   no evidence of ischemic changes QTC 438     ED Triage Vitals  Enc Vitals Group     BP 06/13/17 1541 96/60     Pulse Rate 06/13/17 1541 99     Resp 06/13/17 1541 15     Temp 06/13/17 1541 98.3 F (36.8 C)     Temp Source 06/13/17 1541 Oral     SpO2 06/13/17 1541 100 %     Weight --      Height --      Head Circumference --      Peak Flow --      Pain Score 06/13/17 1626 0     Pain Loc --      Pain Edu? --      Excl. in [place]? --   TMAX(24)@       Latest  Blood pressure 96/60, pulse 99, temperature 98.3 F (36.8 C), temperature source Oral, resp. rate 15, SpO2 100 %.   ER Provider Called:     Dr. Nephrology They Recommend admit to [place] alone as there is no indication for  emergent hemodialysis at this time Will see in AM   Hospitalist was called for admission for Acute renal failure   Review of Systems:    Pertinent positives include: fatigue, abdominal pain, nausea, vomiting, melena  Constitutional:  No weight loss, night sweats, Fevers, chills, weight loss  HEENT:  No headaches, Difficulty swallowing,Tooth/dental problems,Sore throat,  No sneezing, itching, ear ache, nasal congestion, post nasal drip,  Cardio-vascular:  No chest pain, Orthopnea, PND, anasarca, dizziness, palpitations.no Bilateral lower extremity swelling  GI:  No heartburn, indigestion, , diarrhea, change in bowel habits, loss of appetite, , blood in stool, hematemesis Resp:  no shortness of breath at rest. No dyspnea on exertion, No excess mucus, no productive cough, No non-productive cough, No coughing up of blood.No change in color of mucus.No wheezing. Skin:  no rash or lesions. No jaundice GU:  no dysuria, change in color of urine, no urgency or frequency. No straining to urinate.  No flank pain.  Musculoskeletal:  No joint pain or no joint swelling. No decreased range of motion. No back pain.  Psych:  No change in mood or affect. No depression or anxiety. No memory loss.  Neuro: no localizing neurological complaints, no tingling, no weakness, no double vision, no gait abnormality, no slurred speech, no confusion  As per HPI otherwise 10 point review of systems negative.   Past Medical History:   Past Medical History:  Diagnosis Date  . ABNORMAL ELECTROCARDIOGRAM 11/02/2008  . ABSCESS, LUNG 10/23/2006  . Anemia 05/26/2014  . BACTERIAL PNEUMONIA 12/28/2009  . BPH (benign prostatic hyperplasia) 11/22/2010  . Cerebellar stroke ([place]) 05/26/2014  . CHEST PAIN 12/24/2009  . Coronary artery calcification seen on CT scan 06/01/2015  . Cramp of limb 07/19/2007  . DIVERTICULOSIS, COLON 03/07/2007  . DVT (deep venous thrombosis) ([place]) 01/2014   LLE  . Emphysema of lung ([place])   .  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/23/2006  . FLANK PAIN, LEFT 02/25/2010  . FREQUENCY, URINARY 12/24/2009  .  HEMORRHOIDS 10/23/2006  . HYPERLIPIDEMIA 10/23/2006  . HYPERTENSION 10/23/2006  . PE (pulmonary embolism) 02/2014  . PLANTAR FASCIITIS, LEFT 11/02/2008  . Pneumonia 12/2013 X 2  . PSA, INCREASED 11/20/2007  . PULMONARY NODULE 05/14/2008  . Unspecified Peripheral Vascular Disease 10/23/2006      Past Surgical History:  Procedure Laterality Date  . HEMORRHOID SURGERY  ?1990's  . INGUINAL HERNIA REPAIR Bilateral ?2000's  . IVC FILTER INSERTION N/A 10/03/2016   Procedure: IVC Filter Retrieveal;  Surgeon: Serafina Mitchell, MD;  Location: [facility];  Service: Cardiovascular;  Laterality: N/A;  . SHOULDER ARTHROSCOPY W/ ROTATOR CUFF REPAIR Right 11/2013  . STAPLING OF BLEBS Right 07/31/2016   Procedure: BLEBECTOMY;  Surgeon: Melrose Nakayama, MD;  Location: [facility];  Service: Thoracic;  Laterality: Right;  . VENA CAVA FILTER PLACEMENT Right 08/04/2016   Procedure: INSERTION VENA-CAVA FILTER;  Surgeon: Melrose Nakayama, MD;  Location: [facility];  Service: Thoracic;  Laterality: Right;  Marland Kitchen VIDEO ASSISTED THORACOSCOPY Right 07/31/2016   Procedure: VIDEO ASSISTED THORACOSCOPY;  Surgeon: Melrose Nakayama, MD;  Location: [facility];  Service: Thoracic;  Laterality: Right;  Marland Kitchen VIDEO ASSISTED THORACOSCOPY Right 08/04/2016   Procedure: REDO VIDEO ASSISTED THORACOSCOPY- EVACUATION OF HEMOTHORAX;  Surgeon: Melrose Nakayama, MD;  Location: [facility];  Service: Thoracic;  Laterality: Right;  Marland Kitchen VIDEO BRONCHOSCOPY WITH ENDOBRONCHIAL NAVIGATION N/A 03/11/2014   Procedure: VIDEO BRONCHOSCOPY WITH ENDOBRONCHIAL NAVIGATION;  Surgeon: Collene Gobble, MD;  Location: [facility];  Service: Thoracic;  Laterality: N/A;    Social History:  Ambulatory  Independently     reports that he has quit smoking. His smoking use included cigarettes. He has a 15.00 pack-year smoking history. He has never used smokeless tobacco. He reports  that he drinks alcohol. He reports that he has current or past drug history. Drug: Marijuana.     Family History:   Family History  Problem Relation Age of Onset  . Heart disease Mother   . Heart disease Father   . Kidney disease Sister        Renal transplant  . Colon cancer Neg Hx     Allergies: No Known Allergies   Prior to Admission medications   Medication Sig Start Date End Date Taking? Authorizing Provider  amLODipine (NORVASC) 10 MG tablet Take 1 tablet (10 mg total) by mouth daily. 12/21/16  Yes Biagio Borg, MD  ezetimibe (ZETIA) 10 MG tablet Take 1 tablet (10 mg total) by mouth daily. 12/21/16  Yes Biagio Borg, MD  lisinopril (PRINIVIL,ZESTRIL) 20 MG tablet TAKE 1 TABLET DAILY 02/12/17  Yes Biagio Borg, MD  rosuvastatin (CRESTOR) 40 MG tablet TAKE 1 TABLET DAILY 02/12/17  Yes Biagio Borg, MD  tadalafil (CIALIS) 20 MG tablet Take 20 mg by mouth daily as needed for erectile dysfunction.   Yes [provider]  XARELTO 10 MG TABS tablet Take 10 mg by mouth daily.  05/09/17  Yes [provider]  rivaroxaban (XARELTO) 10 MG TABS tablet Take 1 tablet (10 mg total) by mouth daily. 12/21/16 03/21/17  Biagio Borg, MD  tadalafil (CIALIS) 20 MG tablet Take 1 tablet (20 mg total) by mouth daily as needed for erectile dysfunction. 12/21/16 01/20/17  Biagio Borg, MD   Physical Exam: Blood pressure 96/60, pulse 99, temperature 98.3 F (36.8 C), temperature source Oral, resp. rate 15, SpO2 100 %. 1. General:  in No Acute distress  Chronically ill -appearing 2. Psychological: Alert and  Oriented 3. Head/ENT:     Dry Mucous Membranes                          Head Non traumatic, neck supple                            Poor Dentition 4. SKIN:   decreased Skin turgor,  Skin clean Dry and intact no rash 5. Heart: Regular rate and rhythm no  Murmur, no Rub or gallop 6. Lungs:  no wheezes or crackles   7. Abdomen: Soft, non-tender, Non distended bowel sounds  present 8. Lower extremities: no clubbing, cyanosis, or edema 9. Neurologically Grossly intact, moving all 4 extremities equally   10. MSK: Normal range of motion   LABS:     Recent Labs  Lab 06/13/17 1047 06/13/17 1736  WBC 8.5 9.6  NEUTROABS 5.8  --   HGB 8.2 Repeated and verified X2.* 7.3*  HCT 23.5 Repeated and verified X2.* 20.4*  MCV 101.1* 97.1  PLT 62.0* 62*   Basic Metabolic Panel: Recent Labs  Lab 06/13/17 1047 06/13/17 1736  NA 134* 133*  K 3.7 3.9  CL 103 106  CO2 18* 18*  GLUCOSE 148* 105*  BUN 84* 93*  CREATININE 10.59* 11.16*  CALCIUM 8.1* 8.2*      Recent Labs  Lab 06/13/17 1047  AST 335*  ALT 474*  ALKPHOS 94  BILITOT 2.0*  PROT 6.0  ALBUMIN 3.0*   No results for input(s): LIPASE, AMYLASE in the last 168 hours. No results for input(s): AMMONIA in the last 168 hours.    HbA1C: Recent Labs    06/13/17 1047  HGBA1C 4.6   CBG: No results for input(s): GLUCAP in the last 168 hours.    Urine analysis:    Component Value Date/Time   COLORURINE YELLOW 12/20/2016 1450   APPEARANCEUR CLEAR 12/20/2016 1450   LABSPEC 1.025 12/20/2016 1450   PHURINE 6.0 12/20/2016 1450   GLUCOSEU NEGATIVE 12/20/2016 1450   HGBUR TRACE-INTACT (A) 12/20/2016 1450   BILIRUBINUR NEGATIVE 12/20/2016 1450   BILIRUBINUR negative 08/26/2015 0858   KETONESUR NEGATIVE 12/20/2016 1450   PROTEINUR NEGATIVE 07/31/2016 0600   UROBILINOGEN 0.2 12/20/2016 1450   NITRITE NEGATIVE 12/20/2016 1450   LEUKOCYTESUR TRACE (A) 12/20/2016 1450       Cultures: No results found for: SDES, SPECREQUEST, CULT, REPTSTATUS   Radiological Exams on Admission: Ct Abdomen Pelvis Wo Contrast  Result Date: 06/13/2017 CLINICAL DATA:  Anemia generalized weakness and fatigue decreased appetite abnormal labs EXAM: CT ABDOMEN AND PELVIS WITHOUT CONTRAST TECHNIQUE: Multidetector CT imaging of the abdomen and pelvis was performed following the standard protocol without IV contrast.  COMPARISON:  PET-CT 06/11/2008, CT chest 10/24/2016 FINDINGS: Lower chest: Lung bases demonstrate scarring within the subpleural right lung base. Nodular subpleural densities in the left lower lobe, likely scarring. No acute consolidation or pleural effusion. Normal heart size. Small hiatal hernia. Hepatobiliary: No focal liver abnormality is seen. No gallstones, gallbladder wall thickening, or biliary dilatation. Pancreas: Unremarkable. No pancreatic ductal dilatation or surrounding inflammatory changes. Spleen: Normal in size without focal abnormality. Adrenals/Urinary Tract: Adrenal glands are within normal limits. No hydronephrosis. Small 2-3 mm stone lower pole left kidney. Possible cyst mid right kidney. Slightly thick-walled appearance of the bladder. Possible small posterior bladder diverticulum. Stomach/Bowel: Stomach is within normal limits. Appendix appears normal. No evidence of bowel wall thickening, distention, or inflammatory changes.  Sigmoid colon diverticula without acute inflammation Vascular/Lymphatic: Moderate-to-marked aortic atherosclerosis. No aneurysmal dilatation. No significantly enlarged lymph nodes. Reproductive: Slightly enlarged prostate Other: Negative for free air or free fluid. Musculoskeletal: Degenerative changes. No acute or suspicious abnormality. IMPRESSION: 1. Negative for hydronephrosis or ureteral stone. Small nonobstructing stone in the left kidney. 2. Sigmoid colon diverticular disease without acute inflammation Electronically Signed   By: Donavan Foil M.D.   On: 06/13/2017 22:00   Dg Chest 2 View  Result Date: 06/13/2017 CLINICAL DATA:  Elevated creatinine with decreased urine output EXAM: CHEST - 2 VIEW COMPARISON:  CT 10/24/2016, radiograph 10/17/2016 FINDINGS: Hyperinflation with emphysematous disease. Stable pleural and parenchymal scarring and surgical changes on the right. Small nodular suprahilar and upper lung, new since 10/17/2016 radiograph. Normal  cardiomediastinal silhouette with aortic atherosclerosis. No pneumothorax. Degenerative changes of the spine. IMPRESSION: No active cardiopulmonary disease. Nodular opacities in the right upper lung, possible evolution of surgical changes. Electronically Signed   By: Donavan Foil M.D.   On: 06/13/2017 21:49    Chart has been reviewed    Assessment/Plan  67 y.o. male with medical history significant of COPD, BPH, CVA, DVT sp IVC/PE  filter, HTN, HLD, spontaneous pneumothorax Admitted for acute renal failure in the setting of dehydration nausea vomiting but also history of alcohol abuse and transaminitis, likely upper GI bleed with   melena  Present on Admission: . Acute renal failure (ARF) (HCC) -likely multifactorial possibly secondary to combination of dehydration secondary to recent nausea and vomiting due to alcohol withdrawal but also possibly worsening chronic kidney disease.  Given alcohol abuse evidence of elevated LFTs decreased albumin and thrombocytopenia suspect liver disease in which case hepatorenal syndrome also the possibility.  History of BPH will need to evaluate for obstruction.   appreciate nephrology consult.  Further recommendation will keep at [facility] for tonight given no emergent need for hemodialysis. Urine electrolytes ordered Renal ultrasound ordered Hold ACE inhibitor . Abnormal LFTs likely secondary to   alcohol abuse.  Will obtain hepatitis serologies Melena -in the setting of recurrent nausea and vomiting suspect possibly Mallory-Weiss tear, given significant anemia and acute renal failure monitor in stepdown.  Will need GI consult in a.m.    . BPH (benign prostatic hyperplasia) we will need to evaluate for any evidence of urinary obstruction . GERD-  continue PPI . Gross hematuria will likely benefit from further workup start with  abdominal imaging defer to nephrology if there is any evidence of nephritis . Hyperlipidemia stable continue home  medications . Unspecified Peripheral Vascular Disease chronic currently appears to be asymptomatic . Dehydration we will rehydrate gently monitor fluid status . Symptomatic anemia -order type and screen hold Xarelto transfuse 1 unit given the patient's symptomatic . COPD not affecting current episode of care [facility]) stable continue medications . Hx of pulmonary embolus status post IVC filter has been removed hold anticoagulation given melena and worsening anemia associated thrombocytopenia  . Thrombocytopenia ([place]) suspect secondary to liver disease will need to further evaluate, . Alcohol abuse patient states he has quit and have not drank for past 5 days currently does not appears to be tremulous in will still monitor for any evidence of withdrawal History of hypertension hold ACE inhibitor  Pulmonary nodules - will need further work up. Currently unable to obtain contrasted studdy  Other plan as per orders.  DVT prophylaxis:  SCD   Code Status:  FULL CODE    as per patient   I had personally discussed  CODE STATUS with patient and family     Family Communication:   Family  at  Bedside  plan of care was discussed with Wife,    Disposition Plan  To home once workup is complete and patient is stable                                            Consults called: Nephrology, spoke with Dr Silverio Decamp with [facility] will see in AM if not [facility] pt will need to call unasigned.   Admission status:   inpatient     Level of care      SDU    Toy Baker 06/13/2017, 9:53 PM   [facility]  Pager [phone]   after 2 AM please page floor coverage PA If 7AM-7PM, please contact the day team taking care of the patient  [email or web address]  Password [email or web address]

## 2017-06-13 NOTE — Telephone Encounter (Signed)
Initial labs indicate critical Hgb 8.2 (pt on xarelto)  Shirron to contact pt -   Has severe anemia that does not require transfusion, but needs:  1) FOBT fecal occult blood testing (maybe this can come from a card from this office)  2) lab addon :  Iron panel  - anemia - If unable to addon today, pt will need separate draw at the lab as he can (to not wait if possible)  3)  Also f/u cbc Mon apr 29  4)  Let pt know, he may need referral to GI if iron is low, as he may have a "slow leak" of blood on the xarelto  5)  Ok to continue the xarelto for now, but he should let us know if he sees any overt bleeding

## 2017-06-13 NOTE — ED Provider Notes (Addendum)
[facility] DEPT Provider Note   CSN: [id number] Arrival date & time: 06/13/17  1532     History   Chief Complaint Chief Complaint  Patient presents with  . Abnormal Lab    HPI Charles [name] is a 67 y.o. male with a PMHx of anemia, BPH, cerebellar CVA, CAD, diverticulosis, DVT/PE on xarelto, emphysema, HTN, HLD, and other conditions listed below, who presents to the ED with complaints of 3-4 days of generalized weakness/fatigue, SOB with exertion, decreased appetite, and lightheadedness with standing.  He states he went to his PCP's office today for these symptoms, and had labs that were abnormal, so they told him to come here.  Per chart review, lab work revealed: Hgb 8.2, Hct 23.5, Plt 62, Cr 10.59, BUN 84, bicarb 18; he was sent here for new onset acute renal failure and worsening anemia from baseline.  He also reports decreased UOP today, stating that he urinated this morning after waking up but not since then.  He also mentions that he had one melanotic stool this morning.  Most of his symptoms seem to worsen with exertion/activity, and he has not tried anything for his symptoms.  He mentions that he hasn't been eating much lately, but he's been trying to stay hydrated, he's been drinking "a lot" of fluids.  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  He denies overdosing on any substances, including denying ingesting antifreeze.  He also mentions that he has multiple family members that are on dialysis, but he has never had kidney issues that he's aware of.  He denies recent changes in medications.  He's compliant with his xarelto, which he's been on for over a year.  He denies any fevers, chills, chest pain, shortness of breath at rest, abdominal pain, nausea, vomiting, diarrhea, constipation, hematochezia, dysuria, hematuria, myalgias, arthralgias, numbness, tingling, focal weakness, syncope, or any other  complaints at this time.  His PCP is Dr. Cathlean Cower at [facility].    The history is provided by the patient and medical records. No language interpreter was used.  Abnormal Lab     Past Medical History:  Diagnosis Date  . ABNORMAL ELECTROCARDIOGRAM 11/02/2008  . ABSCESS, LUNG 10/23/2006  . Anemia 05/26/2014  . BACTERIAL PNEUMONIA 12/28/2009  . BPH (benign prostatic hyperplasia) 11/22/2010  . Cerebellar stroke ([place]) 05/26/2014  . CHEST PAIN 12/24/2009  . Coronary artery calcification seen on CT scan 06/01/2015  . Cramp of limb 07/19/2007  . DIVERTICULOSIS, COLON 03/07/2007  . DVT (deep venous thrombosis) ([place]) 01/2014   LLE  . Emphysema of lung ([place])   . 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/23/2006  . FLANK PAIN, LEFT 02/25/2010  . FREQUENCY, URINARY 12/24/2009  . HEMORRHOIDS 10/23/2006  . HYPERLIPIDEMIA 10/23/2006  . HYPERTENSION 10/23/2006  . PE (pulmonary embolism) 02/2014  . PLANTAR FASCIITIS, LEFT 11/02/2008  . Pneumonia 12/2013 X 2  . PSA, INCREASED 11/20/2007  . PULMONARY NODULE 05/14/2008  . Unspecified Peripheral Vascular Disease 10/23/2006    Patient Active Problem List   Diagnosis Date Noted  . Balance problems 06/13/2017  . Acute hearing loss, right 06/13/2017  . Ear pain, bilateral 08/24/2016  . Lung blebs ([place]) 07/31/2016  . Acute DVT (deep venous thrombosis) ([place]) 07/25/2016  . Pneumothorax on right 07/23/2016  . PE (pulmonary thromboembolism) ([place]) 07/23/2016  . Acute renal failure superimposed on stage 3 chronic kidney disease ([place]) 07/23/2016  . Gross hematuria 04/25/2016  . UTI (urinary tract infection) 08/26/2015  .  Bilateral hearing loss 08/26/2015  . Coronary artery calcification seen on CT scan 06/01/2015  . Low back pain 06/01/2015  . Hyperglycemia 12/18/2014  . Anemia 05/26/2014  . Cerebellar stroke ([place]) 05/26/2014  . Brain contusion ([place]) 04/23/2014  . Lung nodule 03/09/2014  . Acute pulmonary embolism ([place]) 02/18/2014  . Multiple nodules of lung 02/18/2014  . Cough 12/24/2013    . Allergic rhinitis 12/24/2013  . Other chest pain 12/24/2013  . Increased prostate specific antigen (PSA) velocity 11/20/2013  . Eustachian tube dysfunction 11/20/2013  . Hearing loss on right 11/20/2013  . Serous otitis media 11/15/2013  . Muscle tightness 10/02/2013  . Rotator cuff injury 09/17/2013  . Skin lesion 07/25/2013  . Anal pain 12/18/2012  . Habitual alcohol use 11/17/2012  . Cellulitis and abscess of buttock 11/15/2012  . Cluster headaches 06/07/2012  . BPH (benign prostatic hyperplasia) 11/22/2010  . Abnormal LFTs 11/14/2010  . Right inguinal hernia 09/16/2010  . Nocturia 08/29/2010  . Preventative health care 08/28/2010  . FREQUENCY, URINARY 12/24/2009  . COPD (chronic obstructive pulmonary disease) ([place]) 05/12/2009  . ABNORMAL ELECTROCARDIOGRAM 11/02/2008  . Solitary pulmonary nodule 05/14/2008  . Cramp of limb 07/19/2007  . DIVERTICULOSIS, COLON 03/07/2007  . Hyperlipidemia 10/23/2006  . 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/23/2006  . Essential hypertension 10/23/2006  . Unspecified Peripheral Vascular Disease 10/23/2006  . HEMORRHOIDS 10/23/2006  . ABSCESS, LUNG 10/23/2006  . GERD 10/23/2006    Past Surgical History:  Procedure Laterality Date  . HEMORRHOID SURGERY  ?1990's  . INGUINAL HERNIA REPAIR Bilateral ?2000's  . IVC FILTER INSERTION N/A 10/03/2016   Procedure: IVC Filter Retrieveal;  Surgeon: Serafina Mitchell, MD;  Location: [facility];  Service: Cardiovascular;  Laterality: N/A;  . SHOULDER ARTHROSCOPY W/ ROTATOR CUFF REPAIR Right 11/2013  . STAPLING OF BLEBS Right 07/31/2016   Procedure: BLEBECTOMY;  Surgeon: [place] Nakayama, MD;  Location: [facility];  Service: Thoracic;  Laterality: Right;  . VENA CAVA FILTER PLACEMENT Right 08/04/2016   Procedure: INSERTION VENA-CAVA FILTER;  Surgeon: [place] Nakayama, MD;  Location: [facility];  Service: Thoracic;  Laterality: Right;  Marland Kitchen VIDEO ASSISTED THORACOSCOPY Right 07/31/2016   Procedure: VIDEO ASSISTED  THORACOSCOPY;  Surgeon: [place] Nakayama, MD;  Location: [facility];  Service: Thoracic;  Laterality: Right;  Marland Kitchen VIDEO ASSISTED THORACOSCOPY Right 08/04/2016   Procedure: REDO VIDEO ASSISTED THORACOSCOPY- EVACUATION OF HEMOTHORAX;  Surgeon: [place] Nakayama, MD;  Location: [facility];  Service: Thoracic;  Laterality: Right;  Marland Kitchen VIDEO BRONCHOSCOPY WITH ENDOBRONCHIAL NAVIGATION N/A 03/11/2014   Procedure: VIDEO BRONCHOSCOPY WITH ENDOBRONCHIAL NAVIGATION;  Surgeon: Collene Gobble, MD;  Location: [facility];  Service: Thoracic;  Laterality: N/A;        Home Medications    Prior to Admission medications   Medication Sig Start Date End Date Taking? Authorizing Provider  amLODipine (NORVASC) 10 MG tablet Take 1 tablet (10 mg total) by mouth daily. 12/21/16   Biagio Borg, MD  ezetimibe (ZETIA) 10 MG tablet Take 1 tablet (10 mg total) by mouth daily. 12/21/16   Biagio Borg, MD  lisinopril (PRINIVIL,ZESTRIL) 20 MG tablet TAKE 1 TABLET DAILY 02/12/17   Biagio Borg, MD  rivaroxaban (XARELTO) 10 MG TABS tablet Take 1 tablet (10 mg total) by mouth daily. 12/21/16 03/21/17  Biagio Borg, MD  rosuvastatin (CRESTOR) 40 MG tablet TAKE 1 TABLET DAILY 02/12/17   Biagio Borg, MD  tadalafil (CIALIS) 20 MG tablet Take 1 tablet (20 mg total) by  mouth daily as needed for erectile dysfunction. 12/21/16 01/20/17  Biagio Borg, MD    Family History Family History  Problem Relation Age of Onset  . Heart disease Mother   . Heart disease Father   . Kidney disease Sister        Renal transplant  . Colon cancer Neg Hx     Social History Social History   Tobacco Use  . Smoking status: Former Smoker    Packs/day: 1.00    Years: 15.00    Pack years: 15.00    Types: Cigarettes  . Smokeless tobacco: Never Used  . Tobacco comment: "quit smoking cigarettes in the 1990's"  Substance Use Topics  . Alcohol use: Yes    Alcohol/week: 0.0 oz    Comment: patient states he stopped 4-5 days ago  . Drug use: Yes    Types:  Marijuana    Comment: daily use     Allergies   Patient has no known allergies.   Review of Systems Review of Systems  Constitutional: Positive for appetite change and fatigue. Negative for chills and fever.  Respiratory: Positive for shortness of breath (with exertion only).   Cardiovascular: Negative for chest pain.  Gastrointestinal: Positive for blood in stool (melena x1 today). Negative for abdominal pain, anal bleeding, constipation, diarrhea, nausea and vomiting.  Genitourinary: Positive for decreased urine volume. Negative for dysuria and hematuria.  Musculoskeletal: Negative for arthralgias and myalgias.  Skin: Negative for color change.  Allergic/Immunologic: Negative for immunocompromised state.  Neurological: Positive for light-headedness (with standing). Negative for syncope, weakness and numbness.  Hematological: Bruises/bleeds easily (on xarelto).  Psychiatric/Behavioral: Negative for confusion.   All other systems reviewed and are negative for acute change except as noted in the HPI.    Physical Exam Updated Vital Signs BP 96/60 (BP Location: Right Arm)   Pulse 99   Temp 98.3 F (36.8 C) (Oral)   Resp 15   SpO2 100%   Physical Exam  Constitutional: He is oriented to person, place, and time. Vital signs are normal. He appears well-developed and well-nourished.  Non-toxic appearance. No distress.  Afebrile, nontoxic, NAD; BP soft  HENT:  Head: Normocephalic and atraumatic.  Mouth/Throat: Oropharynx is clear and moist. Mucous membranes are dry.  Lips dry  Eyes: Conjunctivae and EOM are normal. Right eye exhibits no discharge. Left eye exhibits no discharge. Scleral icterus (possible) is present.  Conjunctiva slightly pale and perhaps slightly icteric as well  Neck: Normal range of motion. Neck supple.  Cardiovascular: Normal rate, regular rhythm, normal heart sounds and intact distal pulses. Exam reveals no gallop and no friction rub.  No murmur  heard. Pulmonary/Chest: Effort normal and breath sounds normal. No respiratory distress. He has no decreased breath sounds. He has no wheezes. He has no rhonchi. He has no rales.  Abdominal: Soft. Normal appearance and bowel sounds are normal. He exhibits no distension. There is no tenderness. There is no rigidity, no rebound, no guarding, no CVA tenderness, no tenderness at McBurney's point and negative Murphy's sign.  Soft, NTND, +BS throughout, no r/g/r, neg murphy's, neg mcburney's, no CVA TTP   Genitourinary: Rectal exam shows guaiac positive stool. Rectal exam shows no external hemorrhoid, no internal hemorrhoid, no fissure, no mass, no tenderness and anal tone normal. Prostate is enlarged. Prostate is not tender.  Genitourinary Comments: Chaperone present No gross blood noted on rectal exam, no melanotic stool but somewhat clay colored stool noted; normal tone, no tenderness, no mass or fissure,  no hemorrhoids appreciated. FOBT+ Prostate mildly enlarged but without warmth, tenderness, or bogginess.   Musculoskeletal: Normal range of motion.  Neurological: He is alert and oriented to person, place, and time. He has normal strength. No sensory deficit.  Skin: Skin is warm, dry and intact. No rash noted.  Psychiatric: He has a normal mood and affect.  Nursing note and vitals reviewed.    ED Treatments / Results  Labs (all labs ordered are listed, but only abnormal results are displayed) Labs Reviewed  BASIC METABOLIC PANEL - Abnormal; Notable for the following components:      Result Value   Sodium 133 (*)    CO2 18 (*)    Glucose, Bld 105 (*)    BUN 93 (*)    Creatinine, Ser 11.16 (*)    Calcium 8.2 (*)    GFR calc non Af Amer 4 (*)    GFR calc Af Amer 5 (*)    All other components within normal limits  CBC - Abnormal; Notable for the following components:   RBC 2.10 (*)    Hemoglobin 7.3 (*)    HCT 20.4 (*)    MCH 34.8 (*)    Platelets 62 (*)    All other components within  normal limits  POC OCCULT BLOOD, ED - Abnormal; Notable for the following components:   Fecal Occult Bld POSITIVE (*)    All other components within normal limits  URINALYSIS, ROUTINE W REFLEX MICROSCOPIC  VITAMIN B12  FOLATE  IRON AND TIBC  FERRITIN  RETICULOCYTES  HEPATIC FUNCTION PANEL  CK  PROTIME-INR  PREPARE RBC (CROSSMATCH)    EKG None  Radiology No results found.  Procedures Procedures (including critical care time)  CRITICAL CARE--symptomatic anemia requiring transfusion Performed by: Reece Agar   Total critical care time: 45 minutes  Critical care time was exclusive of separately billable procedures and treating other patients.  Critical care was necessary to treat or prevent imminent or life-threatening deterioration.  Critical care was time spent personally by me on the following activities: development of treatment plan with patient and/or surrogate as well as nursing, discussions with consultants, evaluation of patient's response to treatment, examination of patient, obtaining history from patient or surrogate, ordering and performing treatments and interventions, ordering and review of laboratory studies, ordering and review of radiographic studies, pulse oximetry and re-evaluation of patient's condition.   Medications Ordered in ED Medications  sodium chloride 0.9 % bolus 1,000 mL (has no administration in time range)  0.9 %  sodium chloride infusion (has no administration in time range)     Initial Impression / Assessment and Plan / ED Course  I have reviewed the triage vital signs and the nursing notes.  Pertinent labs & imaging results that were available during my care of the patient were reviewed by me and considered in my medical decision making (see chart for details).     67 y.o. male here with abnormal labs sent from his PCP's office; he states he's been generally fatigued and gets SOB with exertion, had loss of appetite, and  lightheadedness with standing. Also had melenotic stool x1 this morning, and decreased UOP. Went to his PCP and they did labs which showed acute renal failure and anemia. On exam, no abdominal tenderness, rectal exam without melena but somewhat clay colored stool, mildly enlarged prostate but nontender. Work up thus far shows: CBC with Hgb 7.3/Hct 20.4 (down from 11.4/34.3 on 12/20/16), plt 62; BMP with bicarb 18, BUN 93, Cr  11.16 (up from a baseline around 1.2). FOBT+. Symptoms consistent with acute renal failure and acute anemia, pt on xarelto so anemia could be from GI bleed. Will get anemia panel; r/b/a of transfusion discussed with pt and he is agreeable to this. Will also add-on LFTs and U/A. Will also get CT abd/pelv to eval for possible etiology of his renal failure and decreased UOP. Will give fluids, transfusion, and admit. Discussed case with my attending Dr. Leonette Monarch who agrees with plan.   8:02 PM Dr. Roel Cluck of [facility] returning page and will admit, wants CK and INR added and wants nephrology consulted. Holding orders to be placed by admitting team. Will talk to nephrology as well.  8:21 PM Dr. Hollie Salk of nephrology returning page, says he may need dialysis eventually but doesn't think he needs it emergently tonight; wants [place] to put a foley in to get strict I&Os. She will see the pt and assist with his care; she is ok with him staying here for now, unless he acutely got worse then he would need transfer for emergent dialysis; but for now she doesn't think this will be necessary. She will put additional orders as well. Pt to be admitted by Dr. Roel Cluck, who will also place holding orders. Please see their notes for further documentation of care. I appreciate their help with this pleasant pt's care. Pt stable at time of admission.    Final Clinical Impressions(s) / ED Diagnoses   Final diagnoses:  Symptomatic anemia  Acute renal failure, unspecified acute renal failure type (HCC)  Thrombocytopenia  (HCC)  Fatigue, unspecified type  Positional lightheadedness    ED Discharge Orders    [address] Nickisha Hum, [place], [place] 06/13/17 2031

## 2017-06-13 NOTE — ED Notes (Signed)
ED TO INPATIENT HANDOFF REPORT  Name/Age/Gender Charles [name] 67 y.o. male  Code Status Code Status History    Date Active Date Inactive Code Status Order ID Comments User Context   10/03/2016 0836 10/03/2016 1342 Full Code [id number]  Serafina Mitchell, MD Inpatient   07/23/2016 2144 08/08/2016 2017 Full Code [id number]  Ivor Costa, MD Inpatient   03/09/2014 1559 03/12/2014 1507 Full Code [id number]  Erick Colace, NP Inpatient      Home/SNF/Other Home  Chief Complaint PCP sent over for labs  Level of Care/Admitting Diagnosis ED Disposition    ED Disposition Condition [place]: [facility] [100102]  Level of Care: Stepdown [14]  Admit to SDU based on following criteria: Hemodynamic compromise or significant risk of instability:  Patient requiring short term acute titration and management of vasoactive drips, and invasive monitoring (i.e., CVP and Arterial line).  Diagnosis: Acute renal failure (ARF) ([place]) [492010]  Admitting Physician: Toy Baker [[id number]]  Attending Physician: Toy Baker [[id number]]  Estimated length of stay: 3 - 4 days  Certification:: I certify this patient will need inpatient services for at least 2 midnights  PT Class (Do Not Modify): Inpatient [101]  PT Acc Code (Do Not Modify): Private [1]       Medical History Past Medical History:  Diagnosis Date  . ABNORMAL ELECTROCARDIOGRAM 11/02/2008  . ABSCESS, LUNG 10/23/2006  . Anemia 05/26/2014  . BACTERIAL PNEUMONIA 12/28/2009  . BPH (benign prostatic hyperplasia) 11/22/2010  . Cerebellar stroke ([place]) 05/26/2014  . CHEST PAIN 12/24/2009  . Coronary artery calcification seen on CT scan 06/01/2015  . Cramp of limb 07/19/2007  . DIVERTICULOSIS, COLON 03/07/2007  . DVT (deep venous thrombosis) ([place]) 01/2014   LLE  . Emphysema of lung ([place])   . 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/23/2006  . FLANK PAIN, LEFT 02/25/2010  . FREQUENCY, URINARY 12/24/2009  . HEMORRHOIDS 10/23/2006  .  HYPERLIPIDEMIA 10/23/2006  . HYPERTENSION 10/23/2006  . PE (pulmonary embolism) 02/2014  . PLANTAR FASCIITIS, LEFT 11/02/2008  . Pneumonia 12/2013 X 2  . PSA, INCREASED 11/20/2007  . PULMONARY NODULE 05/14/2008  . Unspecified Peripheral Vascular Disease 10/23/2006    Allergies No Known Allergies  IV Location/Drains/Wounds Patient Lines/Drains/Airways Status   Active Line/Drains/Airways    Name:   Placement date:   Placement time:   Site:   Days:   Peripheral IV 06/13/17 Right Antecubital   06/13/17    2038    Antecubital   less than 1   Peripheral IV 06/13/17 Right Hand   06/13/17    2039    Hand   less than 1   Incision (Closed) 07/31/16 Chest Right   07/31/16    0836     317   Incision (Closed) 08/04/16 Chest Right   08/04/16    1436     313   Incision (Closed) 08/04/16 Other (Comment) Right   08/04/16    1459     313          Labs/Imaging Results for orders placed or performed during the hospital encounter of 06/13/17 (from the past 48 hour(s))  Basic metabolic panel     Status: Abnormal   Collection Time: 06/13/17  5:36 PM  Result Value Ref Range   Sodium 133 (L) 135 - 145 mmol/L   Potassium 3.9 3.5 - 5.1 mmol/L   Chloride 106 101 - 111 mmol/L   CO2 18 (L) 22 - 32 mmol/L   Glucose, Bld  105 (H) 65 - 99 mg/dL   BUN 93 (H) 6 - 20 mg/dL   Creatinine, Ser 11.16 (H) 0.61 - 1.24 mg/dL   Calcium 8.2 (L) 8.9 - 10.3 mg/dL   GFR calc non Af Amer 4 (L) >60 mL/min   GFR calc Af Amer 5 (L) >60 mL/min    Comment: (NOTE) The eGFR has been calculated using the CKD EPI equation. This calculation has not been validated in all clinical situations. eGFR's persistently <60 mL/min signify possible Chronic Kidney Disease.    Anion gap 9 5 - 15    Comment: Performed at [facility], [place] [address] [place], [place] [address]  CBC     Status: Abnormal   Collection Time: 06/13/17  5:36 PM  Result Value Ref Range   WBC 9.6 4.0 - 10.5 K/uL   RBC 2.10 (L) 4.22 - 5.81 MIL/uL    Hemoglobin 7.3 (L) 13.0 - 17.0 g/dL   HCT 20.4 (L) 39.0 - 52.0 %   MCV 97.1 78.0 - 100.0 fL   MCH 34.8 (H) 26.0 - 34.0 pg   MCHC 35.8 30.0 - 36.0 g/dL   RDW 13.9 11.5 - 15.5 %   Platelets 62 (L) 150 - 400 K/uL    Comment: SPECIMEN CHECKED FOR CLOTS REPEATED TO VERIFY PLATELET COUNT CONFIRMED BY SMEAR Performed at [facility] [address], [place], [place] [address]   Reticulocytes     Status: Abnormal   Collection Time: 06/13/17  7:21 PM  Result Value Ref Range   Retic Ct Pct 3.0 0.4 - 3.1 %   RBC. 2.17 (L) 4.22 - 5.81 MIL/uL   Retic Count, Absolute 65.1 19.0 - 186.0 K/uL    Comment: Performed at [facility], [place] [address], [place], [place] [address]  Hepatic function panel     Status: Abnormal   Collection Time: 06/13/17  7:21 PM  Result Value Ref Range   Total Protein 7.1 6.5 - 8.1 g/dL   Albumin 3.0 (L) 3.5 - 5.0 g/dL   AST 259 (H) 15 - 41 U/L   ALT 450 (H) 17 - 63 U/L   Alkaline Phosphatase 84 38 - 126 U/L   Total Bilirubin 1.9 (H) 0.3 - 1.2 mg/dL   Bilirubin, Direct 0.7 (H) 0.1 - 0.5 mg/dL   Indirect Bilirubin 1.2 (H) 0.3 - 0.9 mg/dL    Comment: Performed at [facility], [place] [address] [place], [place] [address]  POC occult blood, ED Provider will collect     Status: Abnormal   Collection Time: 06/13/17  7:31 PM  Result Value Ref Range   Fecal Occult Bld POSITIVE (A) NEGATIVE  CK     Status: None   Collection Time: 06/13/17  8:03 PM  Result Value Ref Range   Total CK 119 49 - 397 U/L    Comment: Performed at [facility], [place] [address], [place], [place] [address]  Protime-INR     Status: Abnormal   Collection Time: 06/13/17  8:03 PM  Result Value Ref Range   Prothrombin Time 17.0 (H) 11.4 - 15.2 seconds   INR 1.40     Comment: Performed at [facility], [place] [address], [place],  [address]  Urinalysis, Routine w reflex microscopic- may I&O cath if menses      Status: Abnormal   Collection Time: 06/13/17  8:14 PM  Result Value Ref Range   Color, Urine YELLOW YELLOW  APPearance CLEAR CLEAR   Specific Gravity, Urine 1.009 1.005 - 1.030   pH 5.0 5.0 - 8.0   Glucose, UA NEGATIVE NEGATIVE mg/dL   Hgb urine dipstick LARGE (A) NEGATIVE   Bilirubin Urine NEGATIVE NEGATIVE   Ketones, ur NEGATIVE NEGATIVE mg/dL   Protein, ur 100 (A) NEGATIVE mg/dL   Nitrite NEGATIVE NEGATIVE   Leukocytes, UA NEGATIVE NEGATIVE   RBC / HPF >50 (H) 0 - 5 RBC/hpf   WBC, UA 21-50 0 - 5 WBC/hpf   Bacteria, UA RARE (A) NONE SEEN   Squamous Epithelial / LPF 0-5 0 - 5    Comment: Please note change in reference range. Performed at [facility] [place] [address] [place], [place] [address]    No results found.  Pending Labs Unresulted Labs (From admission, onward)   Start     Ordered   06/13/17 2127  Acetaminophen level  STAT,   STAT     06/13/17 2126   36/14/43 1540  Salicylate level  STAT,   STAT     06/13/17 2126   06/13/17 2111  Creatinine, urine, random  Once,   R     06/13/17 2110   06/13/17 2110  Sodium, urine, random  Once,   R     06/13/17 2110   06/13/17 2102  Troponin I (q 6hr x 3)  Now then every 6 hours,   R     06/13/17 2101   06/13/17 1924  Prepare RBC  (Adult Blood Administration - PRBC)  Once,   R    Question Answer Comment  # of Units 2 units   Transfusion Indications Symptomatic Anemia   If emergent release call blood bank [facility] [phone]      06/13/17 1923   06/13/17 1921  Vitamin B12  (Anemia Panel (PNL))  STAT,   STAT     06/13/17 1923   06/13/17 1921  Folate  (Anemia Panel (PNL))  STAT,   STAT     06/13/17 1923   06/13/17 1921  Iron and TIBC  (Anemia Panel (PNL))  STAT,   STAT     06/13/17 1923   06/13/17 1921  Ferritin  (Anemia Panel (PNL))  STAT,   STAT     06/13/17 1923   Signed and Held  HIV antibody  Tomorrow morning,   R     Signed and Held   Signed and Held  Hepatitis panel, acute  Tomorrow morning,    R     Signed and Held      Vitals/Pain Today's Vitals   06/13/17 1541 06/13/17 1626 06/13/17 2101  BP: 96/60  111/70  Pulse: 99  81  Resp: 15  (!) 21  Temp: 98.3 F (36.8 C)    TempSrc: Oral    SpO2: 100%  100%  PainSc:  0-No pain     Isolation Precautions No active isolations  Medications Medications  sodium chloride 0.9 % bolus 1,000 mL (has no administration in time range)  0.9 %  sodium chloride infusion (has no administration in time range)  thiamine (B-1) injection 100 mg (has no administration in time range)    Mobility walks

## 2017-06-13 NOTE — Telephone Encounter (Signed)
Received results of further critical lab:   Creat 10.59  OK to Disregard previous instructions regarding anemia  Pt should go to ED NOW for Complete Renal Failure (new onset)  Hold on taking the lisinopril for now

## 2017-06-13 NOTE — Assessment & Plan Note (Signed)
Lab Results  Component Value Date   LDLCALC 87 12/20/2016  stable overall by history and exam, recent data reviewed with pt, and pt to continue medical treatment as before,  to f/u any worsening symptoms or concerns

## 2017-06-13 NOTE — ED Notes (Signed)
Consult to [facility] for Hospitalist,Doutova,MD. @ 21:42pm.

## 2017-06-13 NOTE — Telephone Encounter (Signed)
Pt reports moderate dizziness x 2-3 days. Positional, occasionally needs to hold onto things to ambulate. "Lightheaded", room does not spin. Denies SOB, CP, no new medications. No congestion, earache, facial pain. B/P unknown. States is staying hydrated.  Pt. states he had eustachian tubes placed 8 months ago. Same day appt made with Dr. Jenny Reichmann. Care advise given per protocol. Reason for Disposition . [1] MODERATE dizziness (e.g., interferes with normal activities) AND [2] has NOT been evaluated by physician for this  (Exception: dizziness caused by heat exposure, sudden standing, or poor fluid intake)  Answer Assessment - Initial Assessment Questions 1. DESCRIPTION: "Describe your dizziness."     Lightheadedness, positional 2. LIGHTHEADED: "Do you feel lightheaded?" (e.g., somewhat faint, woozy, weak upon standing)     yes 3. VERTIGO: "Do you feel like either you or the room is spinning or tilting?" (i.e. vertigo)     no 4. SEVERITY: "How bad is it?"  "Do you feel like you are going to faint?" "Can you stand and walk?"   - MILD - walking normally   - MODERATE - interferes with normal activities (e.g., work, school)    - SEVERE - unable to stand, requires support to walk, feels like passing out now.      moderate 5. ONSET:  "When did the dizziness begin?"     2-3 days ago 6. AGGRAVATING FACTORS: "Does anything make it worse?" (e.g., standing, change in head position)     Positional; sitting to standing worsens 7. HEART RATE: "Can you tell me your heart rate?" "How many beats in 15 seconds?"  (Note: not all patients can do this)        8. CAUSE: "What do you think is causing the dizziness?"     unsure 9. RECURRENT SYMPTOM: "Have you had dizziness before?" If so, ask: "When was the last time?" "What happened that time?"     no 10. OTHER SYMPTOMS: "Do you have any other symptoms?" (e.g., fever, chest pain, vomiting, diarrhea, bleeding)       no  Protocols used: DIZZINESS [facility]

## 2017-06-14 ENCOUNTER — Inpatient Hospital Stay (HOSPITAL_COMMUNITY): Payer: 59

## 2017-06-14 DIAGNOSIS — N179 Acute kidney failure, unspecified: Secondary | ICD-10-CM

## 2017-06-14 DIAGNOSIS — R945 Abnormal results of liver function studies: Secondary | ICD-10-CM

## 2017-06-14 DIAGNOSIS — D649 Anemia, unspecified: Secondary | ICD-10-CM

## 2017-06-14 DIAGNOSIS — N170 Acute kidney failure with tubular necrosis: Secondary | ICD-10-CM | POA: Insufficient documentation

## 2017-06-14 DIAGNOSIS — K921 Melena: Secondary | ICD-10-CM

## 2017-06-14 DIAGNOSIS — F1010 Alcohol abuse, uncomplicated: Secondary | ICD-10-CM

## 2017-06-14 LAB — IRON AND TIBC
Iron: 59 ug/dL (ref 45–182)
SATURATION RATIOS: 22 % (ref 17.9–39.5)
TIBC: 266 ug/dL (ref 250–450)
UIBC: 207 ug/dL

## 2017-06-14 LAB — COMPREHENSIVE METABOLIC PANEL
ALT: 329 U/L — ABNORMAL HIGH (ref 17–63)
AST: 163 U/L — AB (ref 15–41)
Albumin: 2.7 g/dL — ABNORMAL LOW (ref 3.5–5.0)
Alkaline Phosphatase: 70 U/L (ref 38–126)
Anion gap: 12 (ref 5–15)
BILIRUBIN TOTAL: 1.5 mg/dL — AB (ref 0.3–1.2)
BUN: 91 mg/dL — AB (ref 6–20)
CO2: 16 mmol/L — ABNORMAL LOW (ref 22–32)
CREATININE: 11.43 mg/dL — AB (ref 0.61–1.24)
Calcium: 8.1 mg/dL — ABNORMAL LOW (ref 8.9–10.3)
Chloride: 107 mmol/L (ref 101–111)
GFR calc Af Amer: 5 mL/min — ABNORMAL LOW (ref >60)
GFR, EST NON AFRICAN AMERICAN: 4 mL/min — AB (ref >60)
Glucose, Bld: 84 mg/dL (ref 65–99)
POTASSIUM: 3.4 mmol/L — AB (ref 3.5–5.1)
Sodium: 135 mmol/L (ref 135–145)
TOTAL PROTEIN: 6 g/dL — AB (ref 6.5–8.1)

## 2017-06-14 LAB — CBC
HEMATOCRIT: 20.5 % — AB (ref 39.0–52.0)
HEMATOCRIT: 21.4 % — AB (ref 39.0–52.0)
Hemoglobin: 7 g/dL — ABNORMAL LOW (ref 13.0–17.0)
Hemoglobin: 7.5 g/dL — ABNORMAL LOW (ref 13.0–17.0)
MCH: 33 pg (ref 26.0–34.0)
MCH: 33 pg (ref 26.0–34.0)
MCHC: 34.1 g/dL (ref 30.0–36.0)
MCHC: 35 g/dL (ref 30.0–36.0)
MCV: 94.3 fL (ref 78.0–100.0)
MCV: 96.7 fL (ref 78.0–100.0)
PLATELETS: 60 10*3/uL — AB (ref 150–400)
PLATELETS: 53 10*3/uL — AB (ref 150–400)
RBC: 2.12 MIL/uL — ABNORMAL LOW (ref 4.22–5.81)
RBC: 2.27 MIL/uL — ABNORMAL LOW (ref 4.22–5.81)
RDW: 13.8 % (ref 11.5–15.5)
RDW: 14.8 % (ref 11.5–15.5)
WBC: 9 10*3/uL (ref 4.0–10.5)
WBC: 9.1 10*3/uL (ref 4.0–10.5)

## 2017-06-14 LAB — HIV ANTIBODY (ROUTINE TESTING W REFLEX): HIV SCREEN 4TH GENERATION: NONREACTIVE

## 2017-06-14 LAB — PHOSPHORUS: PHOSPHORUS: 3.9 mg/dL (ref 2.5–4.6)

## 2017-06-14 LAB — BASIC METABOLIC PANEL
ANION GAP: 13 (ref 5–15)
BUN: 95 mg/dL — AB (ref 6–20)
CO2: 15 mmol/L — ABNORMAL LOW (ref 22–32)
Calcium: 8.2 mg/dL — ABNORMAL LOW (ref 8.9–10.3)
Chloride: 106 mmol/L (ref 101–111)
Creatinine, Ser: 11.34 mg/dL — ABNORMAL HIGH (ref 0.61–1.24)
GFR calc Af Amer: 5 mL/min — ABNORMAL LOW (ref >60)
GFR calc non Af Amer: 4 mL/min — ABNORMAL LOW (ref >60)
Glucose, Bld: 101 mg/dL — ABNORMAL HIGH (ref 65–99)
POTASSIUM: 3.5 mmol/L (ref 3.5–5.1)
SODIUM: 134 mmol/L — AB (ref 135–145)

## 2017-06-14 LAB — VITAMIN B12: VITAMIN B 12: 1124 pg/mL — AB (ref 180–914)

## 2017-06-14 LAB — MAGNESIUM: Magnesium: 1.9 mg/dL (ref 1.7–2.4)

## 2017-06-14 LAB — LACTATE DEHYDROGENASE: LDH: 315 U/L — ABNORMAL HIGH (ref 98–192)

## 2017-06-14 LAB — HEMOGLOBIN AND HEMATOCRIT, BLOOD
HEMATOCRIT: 28.2 % — AB (ref 39.0–52.0)
Hemoglobin: 9.7 g/dL — ABNORMAL LOW (ref 13.0–17.0)

## 2017-06-14 LAB — PREPARE RBC (CROSSMATCH)

## 2017-06-14 LAB — ECHOCARDIOGRAM COMPLETE
HEIGHTINCHES: 76 in
Weight: 2645.52 oz

## 2017-06-14 LAB — TSH: TSH: 2.917 u[IU]/mL (ref 0.350–4.500)

## 2017-06-14 LAB — FERRITIN: Ferritin: 2835 ng/mL — ABNORMAL HIGH (ref 24–336)

## 2017-06-14 LAB — TROPONIN I: Troponin I: <0.03 ng/mL (ref <0.03)

## 2017-06-14 LAB — MRSA PCR SCREENING: MRSA BY PCR: NEGATIVE

## 2017-06-14 MED ORDER — SODIUM BICARBONATE 650 MG PO TABS
650.0000 mg | ORAL_TABLET | Freq: Three times a day (TID) | ORAL | Status: DC
  Administered 2017-06-14 – 2017-06-16 (×6): 650 mg via ORAL
  Filled 2017-06-14 (×6): qty 1

## 2017-06-14 MED ORDER — FAMOTIDINE IN NACL 20-0.9 MG/50ML-% IV SOLN
20.0000 mg | Freq: Every day | INTRAVENOUS | Status: DC
  Administered 2017-06-14: 20 mg via INTRAVENOUS
  Filled 2017-06-14: qty 50

## 2017-06-14 MED ORDER — DEXTROSE-NACL 5-0.45 % IV SOLN
INTRAVENOUS | Status: DC
  Administered 2017-06-14 – 2017-06-16 (×2): via INTRAVENOUS

## 2017-06-14 MED ORDER — SODIUM CHLORIDE 0.9 % IV SOLN: Freq: Once | INTRAVENOUS | Status: AC

## 2017-06-14 MED ORDER — LIP MEDEX EX OINT
TOPICAL_OINTMENT | CUTANEOUS | Status: AC
  Filled 2017-06-14: qty 7

## 2017-06-14 NOTE — Progress Notes (Addendum)
PROGRESS NOTE    Charles [name]  DPO:[id number] DOB: [birth year] DOA: 06/13/2017 PCP: Biagio Borg, MD    Brief Narrative:  67 year old male who presented with worsening kidney function.  He does have the significant past medical history of COPD, BPH, cerebellar CVA, history of DVT/PE status post IVC filter, hypertension, dyslipidemia and history of spontaneous pneumothorax.  For the last 5 days prior to hospitalization, patient experienced withdrawal symptoms consistent with nausea, decreased p.o. intake and difficulty ambulating.  He was seen by his primary care physician who found him in acute renal failure, he was referred to the hospital for further evaluation.  On the initial physical examination blood pressure 96/60, heart rate 90, respiratory 15, temperature 98.3, oxygen saturation 100%.  Dry mucous membranes, lungs clear to auscultation bilaterally, heart S1-S2 present rhythmic, the abdomen soft nontender, no lower extremity edema.  Sodium 133, potassium 3.9, chloride 106, bicarb 18, glucose 105, BUN 93, creatinine 11.16, white count 9.6, hemoglobin 7.3, hematocrit 20.4, platelets 62.  Urinalysis pH 5.0, protein 100, specific gravity 1.009, RBCs greater than 50, white cells 21-50, urine sodium 33, fecal occult blood positive.  CT of the abdomen negative for hydronephrosis or ureteral stone.  Chest x-ray with hyperinflation right base scaring (chronic).  EKG normal sinus rhythm, normal axis normal intervals.  Patient was admitted to the hospital with the working diagnosis of worsening renal failure, acute kidney injury, complicated of symptomatic anemia, to rule out acute blood loss, due to upper GI bleed.    Assessment & Plan:   Active Problems:   Hyperlipidemia   Unspecified Peripheral Vascular Disease   GERD   Abnormal LFTs   BPH (benign prostatic hyperplasia)   Anemia   Gross hematuria   Acute renal failure superimposed on stage 3 chronic kidney disease (HCC)   Dehydration  Symptomatic anemia   COPD not affecting current episode of care ([place])   Hx of pulmonary embolus   Thrombocytopenia (HCC)   Alcohol abuse   Acute renal failure (ARF) ([place])   1.  Acute kidney injury. Worsening serum cr at 11,43, with K at 3,4. Will add dextrose and half normal saline at 75 ml per hour, advance diet as tolerated. Serum bicarbonate at 16, will add sodium bicarbonate tid. No indications for urgent HD. Continue to hold on nephrotoxic medications, avoid hypotension. Renal US with no hydronephrosis. Urine analysis with no casts, but hematuria and elevated wbc. Considering poor oral intake, and admission hypotension, possible ATN. Will transfuse one unit prbc, follow on renal panel in am.   2.  Alcohol abuse. No signs of active withdrawal, will continue thiamine and will add IV dextrose, advance diet as tolerated.   3.  Hypotension. Hold antihypertensive agents. Continue blood pressure monitoring.   4.  Symptomatic anemia, possible acute blood loss due to upper GI bleed. Iron panel cw anemia of chronic disease, will transfuse one unit prbc, and will continue antiacid therapy. Avoid proton pump inhibitors, follow with GI recommendations.   5. Hx of DVT and PE. Holding anticoagulation in the setting of acute anemia. Will follow on cell count and GI workup before resuming anticoagulation.    DVT prophylaxis: scd  Code Status:  full Family Communication: no family at the bedside  Disposition Plan:  Home when recovery of renal function.    Consultants:   Nephrology   GI  Procedures:     Antimicrobials:      Subjective: Patient is feeling better, no nausea or vomiting, improved appetite. No dyspnea,  no chest pain. Patient stop drinking alcohol 5 days, with significant decrease po intake. Drinking liquor daily no ethyleneglycol.     Objective: Vitals:   06/14/17 0700 06/14/17 0800 06/14/17 0815 06/14/17 0825  BP: 107/62 118/62 118/62 118/62  Pulse: 71 81 71 71  Resp:  (!) 21 (!) 23 19 19   Temp:  98 F (36.7 C)  98 F (36.7 C)  TempSrc:  Oral  Oral  SpO2: 100% 100% 100%   Weight:    75 kg (165 lb 5.5 oz)  Height:    6\' 4"  (1.93 m)    Intake/Output Summary (Last 24 hours) at 06/14/2017 0834 Last data filed at 06/14/2017 0800 Gross per 24 hour  Intake 1421.25 ml  Output 700 ml  Net 721.25 ml   Filed Weights   06/14/17 0825  Weight: 75 kg (165 lb 5.5 oz)    Examination:   General: Not in pain or dyspnea Neurology: Awake and alert, non focal/ no tremors or anxiety.   E ENT: mild pallor, no icterus, oral mucosa moist Cardiovascular: No JVD. S1-S2 present, rhythmic, no gallops, rubs, or murmurs. No lower extremity edema. Pulmonary: vesicular breath sounds bilaterally, adequate air movement, no wheezing, rhonchi or rales. Gastrointestinal. Abdomen with no organomegaly, non tender, no rebound or guarding Skin. No rashes Musculoskeletal: no joint deformities     Data Reviewed: I have personally reviewed following labs and imaging studies  CBC: Recent Labs  Lab 06/13/17 1047 06/13/17 1736 06/13/17 2142 06/13/17 2345 06/14/17 0558  WBC 8.5 9.6  --  9.0 9.1  NEUTROABS 5.8  --  6.9  --   --   HGB 8.2 Repeated and verified X2.* 7.3*  --  7.0* 7.5*  HCT 23.5 Repeated and verified X2.* 20.4*  --  20.5* 21.4*  MCV 101.1* 97.1  --  96.7 94.3  PLT 62.0* 62*  --  60* 53*   Basic Metabolic Panel: Recent Labs  Lab 06/13/17 1047 06/13/17 1736 06/13/17 2345 06/14/17 0558  NA 134* 133* 134* 135  K 3.7 3.9 3.5 3.4*  CL 103 106 106 107  CO2 18* 18* 15* 16*  GLUCOSE 148* 105* 101* 84  BUN 84* 93* 95* 91*  CREATININE 10.59* 11.16* 11.34* 11.43*  CALCIUM 8.1* 8.2* 8.2* 8.1*  MG  --   --   --  1.9  PHOS  --   --   --  3.9   GFR: Estimated Creatinine Clearance: 6.9 mL/min (A) (by C-G formula based on SCr of 11.43 mg/dL (H)). Liver Function Tests: Recent Labs  Lab 06/13/17 1047 06/13/17 1921 06/13/17 2142 06/14/17 0558  AST 335* 259*   --  163*  ALT 474* 450*  --  329*  ALKPHOS 94 84  --  70  BILITOT 2.0* 1.9* 1.6* 1.5*  PROT 6.0 7.1  --  6.0*  ALBUMIN 3.0* 3.0*  --  2.7*   No results for input(s): LIPASE, AMYLASE in the last 168 hours. No results for input(s): AMMONIA in the last 168 hours. Coagulation Profile: Recent Labs  Lab 06/13/17 2003  INR 1.40   Cardiac Enzymes: Recent Labs  Lab 06/13/17 2003 06/13/17 2243 06/14/17 0558  CKTOTAL 119  --   --   TROPONINI  --  <0.03 <0.03   BNP (last 3 results) No results for input(s): PROBNP in the last 8760 hours. HbA1C: Recent Labs    06/13/17 1047  HGBA1C 4.6   CBG: No results for input(s): GLUCAP in the last 168 hours. Lipid  Profile: Recent Labs    06/13/17 1047  CHOL 157  HDL 22.70*  TRIG 343.0*  CHOLHDL 7  LDLDIRECT 91.0   Thyroid Function Tests: Recent Labs    06/14/17 0558  TSH 2.917   Anemia Panel: Recent Labs    06/13/17 1921  VITAMINB12 1,124*  FOLATE 15.4  FERRITIN 2,835*  TIBC 266  IRON 59  RETICCTPCT 3.0      Radiology Studies: I have reviewed all of the imaging during this hospital visit personally     Scheduled Meds: . folic acid  1 mg Oral Daily  . multivitamin with minerals  1 tablet Oral Daily  . thiamine  100 mg Oral Daily   Or  . thiamine  100 mg Intravenous Daily   Continuous Infusions: . sodium chloride    . sodium chloride 75 mL/hr at 06/14/17 0800  . famotidine (PEPCID) IV    . sodium chloride       LOS: 1 day        Tawni Millers, MD [facility] Pager [phone]

## 2017-06-14 NOTE — Progress Notes (Signed)
*   Echocardiogram 2D Echocardiogram has been performed.  Charles [name] 06/14/2017, 1:25 PM

## 2017-06-14 NOTE — Consult Note (Signed)
Consultation  Referring Provider:    Dr. Cathlean Sauer Primary Care Physician:  Biagio Borg, MD Primary Gastroenterologist: Dr. Ardis Hughs       Reason for Consultation: Elevated LFTs, anemia            HPI:   Charles [name] is a 67 y.o. male with a past medical history of hypertension, history of DVT/PE on Xarelto, history of acute PE in 2018 in setting of stopped anticoagulation status post hemo-pneumothorax, alcohol abuse, PAD and CKD 3A who presented to the hospital 06/13/2017 from his PCPs office after labs showing an elevated creatinine of 10.5.  We were consulted in regards to elevated LFTs and anemia.    Today, describes he was in his usual state of health until about 4-5 days ago when he decided to quit drinking again.  Was drinking a half a pint of alcohol a day which he had restarted in October until now.  Previous to this had been abstaining for 2 years.  Describes that he started with shaking when he would stand up as well as some shortness of breath and felt like he was just in the "DTs" from stopping his alcohol.  Also describes being constipated initially with one black stool a couple of days ago and no further stools since.  Did present to his PCP who had labs revealing a creatinine of 10.59, BUN of 84, AST 335, ALT 474, T bili 2.0, hemoglobin 8.2.  He was sent to the ER.    Currently, describes that generally he feels well, he had noticed a decrease in urination but thought this was due to his decrease in alcohol.  Denies any abdominal pain or other symptoms.    Denies fever, chills, weight loss, anorexia, heartburn, reflux, nausea or vomiting.  ER work-up: CT abdomen pelvis without contrast showing negative for hydronephrosis or ureteral stone.  Small nonobstructing stone in the left kidney.  Sigmoid colon diverticular disease without acute inflammation.  Previous GI history: 06/21/2015-colonoscopy: six 4-7 mm polyps in sigmoid colon and descending colon, diverticulosis in the entire  examined colon, internal hemorrhoids and otherwise normal.  Path showing tubular adenomas.  Repeat recommended in 3 years.  Past Medical History:  Diagnosis Date  . ABNORMAL ELECTROCARDIOGRAM 11/02/2008  . ABSCESS, LUNG 10/23/2006  . Anemia 05/26/2014  . BACTERIAL PNEUMONIA 12/28/2009  . BPH (benign prostatic hyperplasia) 11/22/2010  . 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 CHEST PAIN 12/24/2009  . Coronary artery calcification seen on CT scan 06/01/2015  . Cramp of limb 07/19/2007  . DIVERTICULOSIS, COLON 03/07/2007  . DVT (deep venous thrombosis) ([place]) 01/2014   LLE  . Emphysema of lung ([place])   . 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/23/2006  . FLANK PAIN, LEFT 02/25/2010  . FREQUENCY, URINARY 12/24/2009  . HEMORRHOIDS 10/23/2006  . HYPERLIPIDEMIA 10/23/2006  . HYPERTENSION 10/23/2006  . PE (pulmonary embolism) 02/2014  . PLANTAR FASCIITIS, LEFT 11/02/2008  . Pneumonia 12/2013 X 2  . PSA, INCREASED 11/20/2007  . PULMONARY NODULE 05/14/2008  . Unspecified Peripheral Vascular Disease 10/23/2006    Past Surgical History:  Procedure Laterality Date  . HEMORRHOID SURGERY  ?1990's  . INGUINAL HERNIA REPAIR Bilateral ?2000's  . IVC FILTER INSERTION N/A 10/03/2016   Procedure: IVC Filter Retrieveal;  Surgeon: Serafina Mitchell, MD;  Location: [facility];  Service: Cardiovascular;  Laterality: N/A;  . SHOULDER ARTHROSCOPY W/ ROTATOR CUFF REPAIR Right 11/2013  . STAPLING OF BLEBS Right 07/31/2016   Procedure: BLEBECTOMY;  Surgeon: Roxan Hockey,  Revonda Standard, MD;  Location: [facility];  Service: Thoracic;  Laterality: Right;  . VENA CAVA FILTER PLACEMENT Right 08/04/2016   Procedure: INSERTION VENA-CAVA FILTER;  Surgeon: Melrose Nakayama, MD;  Location: [facility];  Service: Thoracic;  Laterality: Right;  Marland Kitchen VIDEO ASSISTED THORACOSCOPY Right 07/31/2016   Procedure: VIDEO ASSISTED THORACOSCOPY;  Surgeon: Melrose Nakayama, MD;  Location: [facility];  Service: Thoracic;  Laterality: Right;  Marland Kitchen VIDEO ASSISTED THORACOSCOPY Right 08/04/2016     Procedure: REDO VIDEO ASSISTED THORACOSCOPY- EVACUATION OF HEMOTHORAX;  Surgeon: Melrose Nakayama, MD;  Location: [facility];  Service: Thoracic;  Laterality: Right;  Marland Kitchen VIDEO BRONCHOSCOPY WITH ENDOBRONCHIAL NAVIGATION N/A 03/11/2014   Procedure: VIDEO BRONCHOSCOPY WITH ENDOBRONCHIAL NAVIGATION;  Surgeon: Collene Gobble, MD;  Location: [facility];  Service: Thoracic;  Laterality: N/A;    Family History  Problem Relation Age of Onset  . Heart disease Mother   . Heart disease Father   . Kidney disease Sister        Renal transplant  . Colon cancer Neg Hx      Social History   Tobacco Use  . Smoking status: Former Smoker    Packs/day: 1.00    Years: 15.00    Pack years: 15.00    Types: Cigarettes  . Smokeless tobacco: Never Used  . Tobacco comment: "quit smoking cigarettes in the 1990's"  Substance Use Topics  . Alcohol use: Yes    Alcohol/week: 0.0 oz    Comment: patient states he stopped 4-5 days ago  . Drug use: Yes    Types: Marijuana    Comment: daily use    Prior to Admission medications   Medication Sig Start Date End Date Taking? Authorizing Provider  amLODipine (NORVASC) 10 MG tablet Take 1 tablet (10 mg total) by mouth daily. 12/21/16  Yes Biagio Borg, MD  ezetimibe (ZETIA) 10 MG tablet Take 1 tablet (10 mg total) by mouth daily. 12/21/16  Yes Biagio Borg, MD  lisinopril (PRINIVIL,ZESTRIL) 20 MG tablet TAKE 1 TABLET DAILY 02/12/17  Yes Biagio Borg, MD  rosuvastatin (CRESTOR) 40 MG tablet TAKE 1 TABLET DAILY 02/12/17  Yes Biagio Borg, MD  tadalafil (CIALIS) 20 MG tablet Take 20 mg by mouth daily as needed for erectile dysfunction.   Yes [provider]  XARELTO 10 MG TABS tablet Take 10 mg by mouth daily.  05/09/17  Yes [provider]  rivaroxaban (XARELTO) 10 MG TABS tablet Take 1 tablet (10 mg total) by mouth daily. 12/21/16 03/21/17  Biagio Borg, MD  tadalafil (CIALIS) 20 MG tablet Take 1 tablet (20 mg total) by mouth daily as needed for erectile  dysfunction. 12/21/16 01/20/17  Biagio Borg, MD    Current Facility-Administered Medications  Medication Dose Route Frequency Provider Last Rate Last Dose  . lip balm (CARMEX) ointment           . acetaminophen (TYLENOL) tablet 650 mg  650 mg Oral Q6H PRN Toy Baker, MD       Or  . acetaminophen (TYLENOL) suppository 650 mg  650 mg Rectal Q6H PRN Doutova, Anastassia, MD      . dextrose 5 %-0.45 % sodium chloride infusion   Intravenous Continuous Arrien, Jimmy Picket, MD   Stopped at 06/14/17 [phone]  . famotidine (PEPCID) IVPB 20 mg premix  20 mg Intravenous QHS Eudelia Bunch, RPH      . folic acid (FOLVITE) tablet 1 mg  1 mg Oral Daily Doutova,  Nyoka Lint, MD   Stopped at 06/14/17 1950  . HYDROcodone-acetaminophen (NORCO/VICODIN) 5-325 MG per tablet 1-2 tablet  1-2 tablet Oral Q4H PRN Doutova, Anastassia, MD      . LORazepam (ATIVAN) tablet 1 mg  1 mg Oral Q6H PRN Toy Baker, MD       Or  . LORazepam (ATIVAN) injection 1 mg  1 mg Intravenous Q6H PRN Doutova, Anastassia, MD      . multivitamin with minerals tablet 1 tablet  1 tablet Oral Daily Toy Baker, MD   Stopped at 06/14/17 0955  . ondansetron (ZOFRAN) tablet 4 mg  4 mg Oral Q6H PRN Toy Baker, MD       Or  . ondansetron (ZOFRAN) injection 4 mg  4 mg Intravenous Q6H PRN Doutova, Anastassia, MD      . sodium bicarbonate tablet 650 mg  650 mg Oral TID Tawni Millers, MD   Stopped at 06/14/17 [phone]  . sodium chloride 0.9 % bolus 1,000 mL  1,000 mL Intravenous Once Doutova, Anastassia, MD      . thiamine (VITAMIN B-1) tablet 100 mg  100 mg Oral Daily Toy Baker, MD   Stopped at 06/14/17 0955   Or  . thiamine (B-1) injection 100 mg  100 mg Intravenous Daily Toy Baker, MD        Allergies as of 06/13/2017  . (No Known Allergies)     Review of Systems:     Constitutional: No weight loss, fever or chills Skin: No rash  Cardiovascular: No chest pain Respiratory:  +SOB Gastrointestinal: See HPI and otherwise negative Genitourinary: +decreased UOP Neurological: +dizziness Musculoskeletal: No new muscle or joint pain Hematologic: No bruising Psychiatric: No history of depression or anxiety   Physical Exam:  Vital signs in last 24 hours: Temp:  [97.6 F (36.4 C)-98.9 F (37.2 C)] 97.6 F (36.4 C) (04/25 1034) Pulse Rate:  [64-99] 67 (04/25 1034) Resp:  [14-28] 16 (04/25 1034) BP: (96-121)/(54-70) 121/65 (04/25 1034) SpO2:  [99 %-100 %] 100 % (04/25 1034) Weight:  [165 lb 5.5 oz (75 kg)] 165 lb 5.5 oz (75 kg) (04/25 0825)   General:   Very Pleasant AA male appears to be in NAD, Well developed, Well nourished, alert and cooperative Head:  Normocephalic and atraumatic. Eyes:   PEERL, EOMI. No icterus. Conjunctiva pink. Ears:  Normal auditory acuity. Neck:  Supple Throat: Oral cavity and pharynx without inflammation, swelling or lesion.  Lungs: Respirations even and unlabored. Lungs clear to auscultation bilaterally.   No wheezes, crackles, or rhonchi.  Heart: Normal S1, S2. No MRG. Regular rate and rhythm. No peripheral edema, cyanosis or pallor.  Abdomen:  Soft, nondistended, nontender. No rebound or guarding. Normal bowel sounds. No appreciable masses or hepatomegaly. Rectal:  Not performed.  Msk:  Symmetrical without gross deformities. Peripheral pulses intact.  Extremities:  Without edema, no deformity or joint abnormality.  Neurologic:  Alert and  oriented x4;  grossly normal neurologically.  Skin:   Dry and intact without significant lesions or rashes. Psychiatric: Demonstrates good judgement and reason without abnormal affect or behaviors.  LAB RESULTS: Recent Labs    06/13/17 1736 06/13/17 2345 06/14/17 0558  WBC 9.6 9.0 9.1  HGB 7.3* 7.0* 7.5*  HCT 20.4* 20.5* 21.4*  PLT 62* 60* 53*   BMET Recent Labs    06/13/17 1736 06/13/17 2345 06/14/17 0558  NA 133* 134* 135  K 3.9 3.5 3.4*  CL 106 106 107  CO2 18* 15* 16*   GLUCOSE  105* 101* 84  BUN 93* 95* 91*  CREATININE 11.16* 11.34* 11.43*  CALCIUM 8.2* 8.2* 8.1*   LFT Recent Labs    06/13/17 2142 06/14/17 0558  PROT  --  6.0*  ALBUMIN  --  2.7*  AST  --  163*  ALT  --  329*  ALKPHOS  --  70  BILITOT 1.6* 1.5*  BILIDIR 0.7*  --   IBILI 0.9  --    PT/INR Recent Labs    06/13/17 2003  LABPROT 17.0*  INR 1.40    STUDIES: Ct Abdomen Pelvis Wo Contrast  Result Date: 06/13/2017 CLINICAL DATA:  Anemia generalized weakness and fatigue decreased appetite abnormal labs EXAM: CT ABDOMEN AND PELVIS WITHOUT CONTRAST TECHNIQUE: Multidetector CT imaging of the abdomen and pelvis was performed following the standard protocol without IV contrast. COMPARISON:  PET-CT 06/11/2008, CT chest 10/24/2016 FINDINGS: Lower chest: Lung bases demonstrate scarring within the subpleural right lung base. Nodular subpleural densities in the left lower lobe, likely scarring. No acute consolidation or pleural effusion. Normal heart size. Small hiatal hernia. Hepatobiliary: No focal liver abnormality is seen. No gallstones, gallbladder wall thickening, or biliary dilatation. Pancreas: Unremarkable. No pancreatic ductal dilatation or surrounding inflammatory changes. Spleen: Normal in size without focal abnormality. Adrenals/Urinary Tract: Adrenal glands are within normal limits. No hydronephrosis. Small 2-3 mm stone lower pole left kidney. Possible cyst mid right kidney. Slightly thick-walled appearance of the bladder. Possible small posterior bladder diverticulum. Stomach/Bowel: Stomach is within normal limits. Appendix appears normal. No evidence of bowel wall thickening, distention, or inflammatory changes. Sigmoid colon diverticula without acute inflammation Vascular/Lymphatic: Moderate-to-marked aortic atherosclerosis. No aneurysmal dilatation. No significantly enlarged lymph nodes. Reproductive: Slightly enlarged prostate Other: Negative for free air or free fluid.  Musculoskeletal: Degenerative changes. No acute or suspicious abnormality. IMPRESSION: 1. Negative for hydronephrosis or ureteral stone. Small nonobstructing stone in the left kidney. 2. Sigmoid colon diverticular disease without acute inflammation Electronically Signed   By: Donavan Foil M.D.   On: 06/13/2017 22:00   Dg Chest 2 View  Result Date: 06/13/2017 CLINICAL DATA:  Elevated creatinine with decreased urine output EXAM: CHEST - 2 VIEW COMPARISON:  CT 10/24/2016, radiograph 10/17/2016 FINDINGS: Hyperinflation with emphysematous disease. Stable pleural and parenchymal scarring and surgical changes on the right. Small nodular suprahilar and upper lung, new since 10/17/2016 radiograph. Normal cardiomediastinal silhouette with aortic atherosclerosis. No pneumothorax. Degenerative changes of the spine. IMPRESSION: No active cardiopulmonary disease. Nodular opacities in the right upper lung, possible evolution of surgical changes. Electronically Signed   By: Donavan Foil M.D.   On: 06/13/2017 21:49   US Renal  Result Date: 06/13/2017 CLINICAL DATA:  Acute kidney injury. History of nephrolithiasis, benign prostatic hypertrophy and hypertension. EXAM: RENAL / URINARY TRACT ULTRASOUND COMPLETE COMPARISON:  CT abdomen and pelvis June 13, 2017 FINDINGS: Right Kidney: Length: 11 cm. Mildly increased cortical echogenicity. No suspicious mass or hydronephrosis visualized. 18 mm anechoic cyst upper pole with increased through transmission. Left Kidney: Length: 11.2 cm. Mildly increased cortical echogenicity. No suspicious mass or hydronephrosis visualized. 18 mm anechoic cyst upper pole with increased through transmission. Known nephrolithiasis not apparent by routine sonography. Bladder: Appears normal for degree of bladder distention. Prostate measures 4.4 x 3.5 x 5.5 cm. IMPRESSION: Mildly echogenic kidneys compatible with medical renal disease. No obstructive uropathy. Electronically Signed   By: Elon Alas M.D.   On: 06/13/2017 22:21     Impression / Plan:   Impression: 1.  Elevated LFTs: Unclear, question  shock liver, imaging as above, hepatitis panel as well as ANA pending 2.  Anemia and thrombocytopenia: Currently receiving blood transfusion, history of one melenic stool 2 days ago, none since; consider alcoholic gastropathy versus varices versus other 3.  AK I on CKD 3A: Traumatic AKI, unclear etiology at present, nephrology following 4.  Alcohol abuse: Half pint per day since October, has not drink in 4 days, prior to this 2 yrs of abstinence  Plan: 1.  Will schedule EGD for tomorrow given hx of melena and current anemia as well as hemoccult + stools.  Discussed risk, benefits, limitations and alternatives and the patient agreed to proceed. 2.  Continue to monitor hemoglobin with transfusion as needed. 3.  Continue to hold Xarelto, last dose 06/13/17 am. 4.  Patient will be n.p.o. after midnight. 5.  Agree with hepatitis panel and ANA 6.  Please await further recommendations from Dr. Hilarie Fredrickson later today  Thank you for your kind consultation, we will continue to follow.  Lavone Nian Ayham Word  06/14/2017, 10:50 AM Pager #: [phone]

## 2017-06-14 NOTE — Progress Notes (Signed)
Nutrition Brief Note  Patient identified on the Malnutrition Screening Tool (MST) Report  Wt Readings from Last 15 Encounters:  06/14/17 165 lb 5.5 oz (75 kg)  06/13/17 165 lb (74.8 kg)  12/21/16 166 lb (75.3 kg)  11/23/16 163 lb (73.9 kg)  10/17/16 166 lb (75.3 kg)  10/03/16 170 lb (77.1 kg)  09/19/16 168 lb (76.2 kg)  08/24/16 168 lb (76.2 kg)  08/22/16 168 lb (76.2 kg)  08/02/16 175 lb (79.4 kg)  04/28/16 178 lb (80.7 kg)  04/25/16 176 lb (79.8 kg)  02/25/16 184 lb 9.6 oz (83.7 kg)  12/22/15 185 lb 12 oz (84.3 kg)  12/21/15 184 lb (83.5 kg)    Body mass index is 20.13 kg/m. Patient meets criteria for normal weight based on current BMI. Pt reports poor appetite the past 4 days with minimal to no intakes during those days. Appetite was at baseline prior to that. Pt reports that during the past 4-5 days he has lost 8 lbs. Chart review indicates weight has been stable for the past 9 months. Skin WDL.  Pt with hx of COPD, BPH, CVA, DVT s/p IVC, HTN, hyperlipidemia, alcohol abuse, and spontaneous pneumothorax. He was drinking 1/2 pint of vodka/day and stopped 5 days ago. He then began having shakes, N/V, and decreased urine output.   Current diet order is NPO.  Medications reviewed; 20 mg IV Pepcid/day, 1 mg oral folic acid/day,daily multivitamin with minerals, 650 mg oral sodium bicarb TID, 100 mg oral thiamine/day.  Labs reviewed; K: 3.4 mmol/L, BUN: 91 mg/dL, creatinine: 11.43 mg/dL, Ca: 8.1 mg/dL, LFTs elevated, GFR: 4 mL/min.   IVF: D-1/2 NS @ 75 mL/hr (306 kcal).    No nutrition interventions warranted at this time. If nutrition issues arise, please consult RD.     Jarome Matin, MS, RD, LDN, [facility] Inpatient Clinical Dietitian Pager # [phone] After hours/weekend pager # [phone]

## 2017-06-14 NOTE — Progress Notes (Signed)
Renal artery duplex has been completed. Right: Normal size right kidney. Abnormal right Resistive Index. 1-59% stenosis of the right renal artery. Left: Normal size of left kidney. Abnormal left Resisitve Index. 1-59% stenosis of the left renal artery.  06/14/17 12:45 PM Charles [name] RVT

## 2017-06-14 NOTE — Progress Notes (Signed)
Pharmacy Renal dose adjustment:  Pepcid reduced to q24 for CrCl < 30  Eudelia Bunch, Pharm.D. [phone] 06/14/2017 3:21 AM

## 2017-06-14 NOTE — Progress Notes (Signed)
Assessment/Plan  1.  AKI on CKD 3a: pt with dramatic AKI ? Hemodynamic with low BP on ACE-I  2.  Anemia and thrombocytopenia: will add on diff, LDH, DAT, fractionate the bilirubin,.s/p PRBCs 3.  EtOH abuse: recent EtOH withdrawal 4.  Elevated LFTs: prob alcohol related   5.  Metabolic acidosis: non anion gap metabolic acidosis.  Likely due to renal failure     Subjective: Interval History: He reports he is a heavy drinker and quit 6 days PTA and went through withdrawal at home. 2-3 days PTA he had orthostatic symptoms.  BP was 96/60. He has a history of hypertension and takes lisinopril.  He went to PCP the day of admission with labs showing AKI, anemia and thrombocytopenia  Objective: Vital signs in last 24 hours: Temp:  [98 F (36.7 C)-98.9 F (37.2 C)] 98 F (36.7 C) (04/25 0800) Pulse Rate:  [64-99] 71 (04/25 0700) Resp:  [14-28] 21 (04/25 0700) BP: (96-120)/(54-70) 107/62 (04/25 0700) SpO2:  [99 %-100 %] 100 % (04/25 0700) Weight:  [74.8 kg (165 lb)] 74.8 kg (165 lb) (04/24 0958) Weight change:   Intake/Output from previous day: 04/24 0701 - 04/25 0700 In: 745 [Blood:745] Out: 500 [Urine:500] Intake/Output this shift: No intake/output data recorded.  General appearance: alert and cooperative Resp: clear to auscultation bilaterally and normal percussion bilaterally Chest wall: no tenderness Cardio: regular rate and rhythm, S1, S2 normal, no murmur, click, rub or gallop Extremities: extremities normal, atraumatic, no cyanosis or edema  Lab Results: Recent Labs    06/13/17 2345 06/14/17 0558  WBC 9.0 9.1  HGB 7.0* 7.5*  HCT 20.5* 21.4*  PLT 60* 53*   BMET:  Recent Labs    06/13/17 2345 06/14/17 0558  NA 134* 135  K 3.5 3.4*  CL 106 107  CO2 15* 16*  GLUCOSE 101* 84  BUN 95* 91*  CREATININE 11.34* 11.43*  CALCIUM 8.2* 8.1*   No results for input(s): PTH in the last 72 hours. Iron Studies:  Recent Labs    06/13/17 1921  IRON 59  TIBC 266   FERRITIN 2,835*   Studies/Results: Ct Abdomen Pelvis Wo Contrast  Result Date: 06/13/2017 CLINICAL DATA:  Anemia generalized weakness and fatigue decreased appetite abnormal labs EXAM: CT ABDOMEN AND PELVIS WITHOUT CONTRAST TECHNIQUE: Multidetector CT imaging of the abdomen and pelvis was performed following the standard protocol without IV contrast. COMPARISON:  PET-CT 06/11/2008, CT chest 10/24/2016 FINDINGS: Lower chest: Lung bases demonstrate scarring within the subpleural right lung base. Nodular subpleural densities in the left lower lobe, likely scarring. No acute consolidation or pleural effusion. Normal heart size. Small hiatal hernia. Hepatobiliary: No focal liver abnormality is seen. No gallstones, gallbladder wall thickening, or biliary dilatation. Pancreas: Unremarkable. No pancreatic ductal dilatation or surrounding inflammatory changes. Spleen: Normal in size without focal abnormality. Adrenals/Urinary Tract: Adrenal glands are within normal limits. No hydronephrosis. Small 2-3 mm stone lower pole left kidney. Possible cyst mid right kidney. Slightly thick-walled appearance of the bladder. Possible small posterior bladder diverticulum. Stomach/Bowel: Stomach is within normal limits. Appendix appears normal. No evidence of bowel wall thickening, distention, or inflammatory changes. Sigmoid colon diverticula without acute inflammation Vascular/Lymphatic: Moderate-to-marked aortic atherosclerosis. No aneurysmal dilatation. No significantly enlarged lymph nodes. Reproductive: Slightly enlarged prostate Other: Negative for free air or free fluid. Musculoskeletal: Degenerative changes. No acute or suspicious abnormality. IMPRESSION: 1. Negative for hydronephrosis or ureteral stone. Small nonobstructing stone in the left kidney. 2. Sigmoid colon diverticular disease without acute inflammation Electronically Signed  By: Donavan Foil M.D.   On: 06/13/2017 22:00   Dg Chest 2 View  Result Date:  06/13/2017 CLINICAL DATA:  Elevated creatinine with decreased urine output EXAM: CHEST - 2 VIEW COMPARISON:  CT 10/24/2016, radiograph 10/17/2016 FINDINGS: Hyperinflation with emphysematous disease. Stable pleural and parenchymal scarring and surgical changes on the right. Small nodular suprahilar and upper lung, new since 10/17/2016 radiograph. Normal cardiomediastinal silhouette with aortic atherosclerosis. No pneumothorax. Degenerative changes of the spine. IMPRESSION: No active cardiopulmonary disease. Nodular opacities in the right upper lung, possible evolution of surgical changes. Electronically Signed   By: Donavan Foil M.D.   On: 06/13/2017 21:49   US Renal  Result Date: 06/13/2017 CLINICAL DATA:  Acute kidney injury. History of nephrolithiasis, benign prostatic hypertrophy and hypertension. EXAM: RENAL / URINARY TRACT ULTRASOUND COMPLETE COMPARISON:  CT abdomen and pelvis June 13, 2017 FINDINGS: Right Kidney: Length: 11 cm. Mildly increased cortical echogenicity. No suspicious mass or hydronephrosis visualized. 18 mm anechoic cyst upper pole with increased through transmission. Left Kidney: Length: 11.2 cm. Mildly increased cortical echogenicity. No suspicious mass or hydronephrosis visualized. 18 mm anechoic cyst upper pole with increased through transmission. Known nephrolithiasis not apparent by routine sonography. Bladder: Appears normal for degree of bladder distention. Prostate measures 4.4 x 3.5 x 5.5 cm. IMPRESSION: Mildly echogenic kidneys compatible with medical renal disease. No obstructive uropathy. Electronically Signed   By: Elon Alas M.D.   On: 06/13/2017 22:21    Scheduled: . folic acid  1 mg Oral Daily  . multivitamin with minerals  1 tablet Oral Daily  . rosuvastatin  40 mg Oral Daily  . thiamine  100 mg Oral Daily   Or  . thiamine  100 mg Intravenous Daily      LOS: 1 day   Charles [name] 06/14/2017,8:09 AM

## 2017-06-14 NOTE — H&P (View-Only) (Signed)
Consultation  Referring Provider:    Dr. Cathlean Sauer Primary Care Physician:  Biagio Borg, MD Primary Gastroenterologist: Dr. Ardis Hughs       Reason for Consultation: Elevated LFTs, anemia            HPI:   Charles [name] is a 67 y.o. male with a past medical history of hypertension, history of DVT/PE on Xarelto, history of acute PE in 2018 in setting of stopped anticoagulation status post hemo-pneumothorax, alcohol abuse, PAD and CKD 3A who presented to the hospital 06/13/2017 from his PCPs office after labs showing an elevated creatinine of 10.5.  We were consulted in regards to elevated LFTs and anemia.    Today, describes he was in his usual state of health until about 4-5 days ago when he decided to quit drinking again.  Was drinking a half a pint of alcohol a day which he had restarted in October until now.  Previous to this had been abstaining for 2 years.  Describes that he started with shaking when he would stand up as well as some shortness of breath and felt like he was just in the "DTs" from stopping his alcohol.  Also describes being constipated initially with one black stool a couple of days ago and no further stools since.  Did present to his PCP who had labs revealing a creatinine of 10.59, BUN of 84, AST 335, ALT 474, T bili 2.0, hemoglobin 8.2.  He was sent to the ER.    Currently, describes that generally he feels well, he had noticed a decrease in urination but thought this was due to his decrease in alcohol.  Denies any abdominal pain or other symptoms.    Denies fever, chills, weight loss, anorexia, heartburn, reflux, nausea or vomiting.  ER work-up: CT abdomen pelvis without contrast showing negative for hydronephrosis or ureteral stone.  Small nonobstructing stone in the left kidney.  Sigmoid colon diverticular disease without acute inflammation.  Previous GI history: 06/21/2015-colonoscopy: six 4-7 mm polyps in sigmoid colon and descending colon, diverticulosis in the entire  examined colon, internal hemorrhoids and otherwise normal.  Path showing tubular adenomas.  Repeat recommended in 3 years.  Past Medical History:  Diagnosis Date  . ABNORMAL ELECTROCARDIOGRAM 11/02/2008  . ABSCESS, LUNG 10/23/2006  . Anemia 05/26/2014  . BACTERIAL PNEUMONIA 12/28/2009  . BPH (benign prostatic hyperplasia) 11/22/2010  . 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 CHEST PAIN 12/24/2009  . Coronary artery calcification seen on CT scan 06/01/2015  . Cramp of limb 07/19/2007  . DIVERTICULOSIS, COLON 03/07/2007  . DVT (deep venous thrombosis) ([place]) 01/2014   LLE  . Emphysema of lung ([place])   . 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/23/2006  . FLANK PAIN, LEFT 02/25/2010  . FREQUENCY, URINARY 12/24/2009  . HEMORRHOIDS 10/23/2006  . HYPERLIPIDEMIA 10/23/2006  . HYPERTENSION 10/23/2006  . PE (pulmonary embolism) 02/2014  . PLANTAR FASCIITIS, LEFT 11/02/2008  . Pneumonia 12/2013 X 2  . PSA, INCREASED 11/20/2007  . PULMONARY NODULE 05/14/2008  . Unspecified Peripheral Vascular Disease 10/23/2006    Past Surgical History:  Procedure Laterality Date  . HEMORRHOID SURGERY  ?1990's  . INGUINAL HERNIA REPAIR Bilateral ?2000's  . IVC FILTER INSERTION N/A 10/03/2016   Procedure: IVC Filter Retrieveal;  Surgeon: Serafina Mitchell, MD;  Location: [facility];  Service: Cardiovascular;  Laterality: N/A;  . SHOULDER ARTHROSCOPY W/ ROTATOR CUFF REPAIR Right 11/2013  . STAPLING OF BLEBS Right 07/31/2016   Procedure: BLEBECTOMY;  Surgeon: Roxan Hockey,  Revonda Standard, MD;  Location: [facility];  Service: Thoracic;  Laterality: Right;  . VENA CAVA FILTER PLACEMENT Right 08/04/2016   Procedure: INSERTION VENA-CAVA FILTER;  Surgeon: Melrose Nakayama, MD;  Location: [facility];  Service: Thoracic;  Laterality: Right;  Marland Kitchen VIDEO ASSISTED THORACOSCOPY Right 07/31/2016   Procedure: VIDEO ASSISTED THORACOSCOPY;  Surgeon: Melrose Nakayama, MD;  Location: [facility];  Service: Thoracic;  Laterality: Right;  Marland Kitchen VIDEO ASSISTED THORACOSCOPY Right 08/04/2016     Procedure: REDO VIDEO ASSISTED THORACOSCOPY- EVACUATION OF HEMOTHORAX;  Surgeon: Melrose Nakayama, MD;  Location: [facility];  Service: Thoracic;  Laterality: Right;  Marland Kitchen VIDEO BRONCHOSCOPY WITH ENDOBRONCHIAL NAVIGATION N/A 03/11/2014   Procedure: VIDEO BRONCHOSCOPY WITH ENDOBRONCHIAL NAVIGATION;  Surgeon: Collene Gobble, MD;  Location: [facility];  Service: Thoracic;  Laterality: N/A;    Family History  Problem Relation Age of Onset  . Heart disease Mother   . Heart disease Father   . Kidney disease Sister        Renal transplant  . Colon cancer Neg Hx      Social History   Tobacco Use  . Smoking status: Former Smoker    Packs/day: 1.00    Years: 15.00    Pack years: 15.00    Types: Cigarettes  . Smokeless tobacco: Never Used  . Tobacco comment: "quit smoking cigarettes in the 1990's"  Substance Use Topics  . Alcohol use: Yes    Alcohol/week: 0.0 oz    Comment: patient states he stopped 4-5 days ago  . Drug use: Yes    Types: Marijuana    Comment: daily use    Prior to Admission medications   Medication Sig Start Date End Date Taking? Authorizing Provider  amLODipine (NORVASC) 10 MG tablet Take 1 tablet (10 mg total) by mouth daily. 12/21/16  Yes Biagio Borg, MD  ezetimibe (ZETIA) 10 MG tablet Take 1 tablet (10 mg total) by mouth daily. 12/21/16  Yes Biagio Borg, MD  lisinopril (PRINIVIL,ZESTRIL) 20 MG tablet TAKE 1 TABLET DAILY 02/12/17  Yes Biagio Borg, MD  rosuvastatin (CRESTOR) 40 MG tablet TAKE 1 TABLET DAILY 02/12/17  Yes Biagio Borg, MD  tadalafil (CIALIS) 20 MG tablet Take 20 mg by mouth daily as needed for erectile dysfunction.   Yes [provider]  XARELTO 10 MG TABS tablet Take 10 mg by mouth daily.  05/09/17  Yes [provider]  rivaroxaban (XARELTO) 10 MG TABS tablet Take 1 tablet (10 mg total) by mouth daily. 12/21/16 03/21/17  Biagio Borg, MD  tadalafil (CIALIS) 20 MG tablet Take 1 tablet (20 mg total) by mouth daily as needed for erectile  dysfunction. 12/21/16 01/20/17  Biagio Borg, MD    Current Facility-Administered Medications  Medication Dose Route Frequency Provider Last Rate Last Dose  . lip balm (CARMEX) ointment           . acetaminophen (TYLENOL) tablet 650 mg  650 mg Oral Q6H PRN Toy Baker, MD       Or  . acetaminophen (TYLENOL) suppository 650 mg  650 mg Rectal Q6H PRN Doutova, Anastassia, MD      . dextrose 5 %-0.45 % sodium chloride infusion   Intravenous Continuous Arrien, Jimmy Picket, MD   Stopped at 06/14/17 [phone]  . famotidine (PEPCID) IVPB 20 mg premix  20 mg Intravenous QHS Eudelia Bunch, RPH      . folic acid (FOLVITE) tablet 1 mg  1 mg Oral Daily Doutova,  Nyoka Lint, MD   Stopped at 06/14/17 2025  . HYDROcodone-acetaminophen (NORCO/VICODIN) 5-325 MG per tablet 1-2 tablet  1-2 tablet Oral Q4H PRN Doutova, Anastassia, MD      . LORazepam (ATIVAN) tablet 1 mg  1 mg Oral Q6H PRN Toy Baker, MD       Or  . LORazepam (ATIVAN) injection 1 mg  1 mg Intravenous Q6H PRN Doutova, Anastassia, MD      . multivitamin with minerals tablet 1 tablet  1 tablet Oral Daily Toy Baker, MD   Stopped at 06/14/17 0955  . ondansetron (ZOFRAN) tablet 4 mg  4 mg Oral Q6H PRN Toy Baker, MD       Or  . ondansetron (ZOFRAN) injection 4 mg  4 mg Intravenous Q6H PRN Doutova, Anastassia, MD      . sodium bicarbonate tablet 650 mg  650 mg Oral TID Tawni Millers, MD   Stopped at 06/14/17 [phone]  . sodium chloride 0.9 % bolus 1,000 mL  1,000 mL Intravenous Once Doutova, Anastassia, MD      . thiamine (VITAMIN B-1) tablet 100 mg  100 mg Oral Daily Toy Baker, MD   Stopped at 06/14/17 0955   Or  . thiamine (B-1) injection 100 mg  100 mg Intravenous Daily Toy Baker, MD        Allergies as of 06/13/2017  . (No Known Allergies)     Review of Systems:     Constitutional: No weight loss, fever or chills Skin: No rash  Cardiovascular: No chest pain Respiratory:  +SOB Gastrointestinal: See HPI and otherwise negative Genitourinary: +decreased UOP Neurological: +dizziness Musculoskeletal: No new muscle or joint pain Hematologic: No bruising Psychiatric: No history of depression or anxiety   Physical Exam:  Vital signs in last 24 hours: Temp:  [97.6 F (36.4 C)-98.9 F (37.2 C)] 97.6 F (36.4 C) (04/25 1034) Pulse Rate:  [64-99] 67 (04/25 1034) Resp:  [14-28] 16 (04/25 1034) BP: (96-121)/(54-70) 121/65 (04/25 1034) SpO2:  [99 %-100 %] 100 % (04/25 1034) Weight:  [165 lb 5.5 oz (75 kg)] 165 lb 5.5 oz (75 kg) (04/25 0825)   General:   Very Pleasant AA male appears to be in NAD, Well developed, Well nourished, alert and cooperative Head:  Normocephalic and atraumatic. Eyes:   PEERL, EOMI. No icterus. Conjunctiva pink. Ears:  Normal auditory acuity. Neck:  Supple Throat: Oral cavity and pharynx without inflammation, swelling or lesion.  Lungs: Respirations even and unlabored. Lungs clear to auscultation bilaterally.   No wheezes, crackles, or rhonchi.  Heart: Normal S1, S2. No MRG. Regular rate and rhythm. No peripheral edema, cyanosis or pallor.  Abdomen:  Soft, nondistended, nontender. No rebound or guarding. Normal bowel sounds. No appreciable masses or hepatomegaly. Rectal:  Not performed.  Msk:  Symmetrical without gross deformities. Peripheral pulses intact.  Extremities:  Without edema, no deformity or joint abnormality.  Neurologic:  Alert and  oriented x4;  grossly normal neurologically.  Skin:   Dry and intact without significant lesions or rashes. Psychiatric: Demonstrates good judgement and reason without abnormal affect or behaviors.  LAB RESULTS: Recent Labs    06/13/17 1736 06/13/17 2345 06/14/17 0558  WBC 9.6 9.0 9.1  HGB 7.3* 7.0* 7.5*  HCT 20.4* 20.5* 21.4*  PLT 62* 60* 53*   BMET Recent Labs    06/13/17 1736 06/13/17 2345 06/14/17 0558  NA 133* 134* 135  K 3.9 3.5 3.4*  CL 106 106 107  CO2 18* 15* 16*   GLUCOSE  105* 101* 84  BUN 93* 95* 91*  CREATININE 11.16* 11.34* 11.43*  CALCIUM 8.2* 8.2* 8.1*   LFT Recent Labs    06/13/17 2142 06/14/17 0558  PROT  --  6.0*  ALBUMIN  --  2.7*  AST  --  163*  ALT  --  329*  ALKPHOS  --  70  BILITOT 1.6* 1.5*  BILIDIR 0.7*  --   IBILI 0.9  --    PT/INR Recent Labs    06/13/17 2003  LABPROT 17.0*  INR 1.40    STUDIES: Ct Abdomen Pelvis Wo Contrast  Result Date: 06/13/2017 CLINICAL DATA:  Anemia generalized weakness and fatigue decreased appetite abnormal labs EXAM: CT ABDOMEN AND PELVIS WITHOUT CONTRAST TECHNIQUE: Multidetector CT imaging of the abdomen and pelvis was performed following the standard protocol without IV contrast. COMPARISON:  PET-CT 06/11/2008, CT chest 10/24/2016 FINDINGS: Lower chest: Lung bases demonstrate scarring within the subpleural right lung base. Nodular subpleural densities in the left lower lobe, likely scarring. No acute consolidation or pleural effusion. Normal heart size. Small hiatal hernia. Hepatobiliary: No focal liver abnormality is seen. No gallstones, gallbladder wall thickening, or biliary dilatation. Pancreas: Unremarkable. No pancreatic ductal dilatation or surrounding inflammatory changes. Spleen: Normal in size without focal abnormality. Adrenals/Urinary Tract: Adrenal glands are within normal limits. No hydronephrosis. Small 2-3 mm stone lower pole left kidney. Possible cyst mid right kidney. Slightly thick-walled appearance of the bladder. Possible small posterior bladder diverticulum. Stomach/Bowel: Stomach is within normal limits. Appendix appears normal. No evidence of bowel wall thickening, distention, or inflammatory changes. Sigmoid colon diverticula without acute inflammation Vascular/Lymphatic: Moderate-to-marked aortic atherosclerosis. No aneurysmal dilatation. No significantly enlarged lymph nodes. Reproductive: Slightly enlarged prostate Other: Negative for free air or free fluid.  Musculoskeletal: Degenerative changes. No acute or suspicious abnormality. IMPRESSION: 1. Negative for hydronephrosis or ureteral stone. Small nonobstructing stone in the left kidney. 2. Sigmoid colon diverticular disease without acute inflammation Electronically Signed   By: Donavan Foil M.D.   On: 06/13/2017 22:00   Dg Chest 2 View  Result Date: 06/13/2017 CLINICAL DATA:  Elevated creatinine with decreased urine output EXAM: CHEST - 2 VIEW COMPARISON:  CT 10/24/2016, radiograph 10/17/2016 FINDINGS: Hyperinflation with emphysematous disease. Stable pleural and parenchymal scarring and surgical changes on the right. Small nodular suprahilar and upper lung, new since 10/17/2016 radiograph. Normal cardiomediastinal silhouette with aortic atherosclerosis. No pneumothorax. Degenerative changes of the spine. IMPRESSION: No active cardiopulmonary disease. Nodular opacities in the right upper lung, possible evolution of surgical changes. Electronically Signed   By: Donavan Foil M.D.   On: 06/13/2017 21:49   US Renal  Result Date: 06/13/2017 CLINICAL DATA:  Acute kidney injury. History of nephrolithiasis, benign prostatic hypertrophy and hypertension. EXAM: RENAL / URINARY TRACT ULTRASOUND COMPLETE COMPARISON:  CT abdomen and pelvis June 13, 2017 FINDINGS: Right Kidney: Length: 11 cm. Mildly increased cortical echogenicity. No suspicious mass or hydronephrosis visualized. 18 mm anechoic cyst upper pole with increased through transmission. Left Kidney: Length: 11.2 cm. Mildly increased cortical echogenicity. No suspicious mass or hydronephrosis visualized. 18 mm anechoic cyst upper pole with increased through transmission. Known nephrolithiasis not apparent by routine sonography. Bladder: Appears normal for degree of bladder distention. Prostate measures 4.4 x 3.5 x 5.5 cm. IMPRESSION: Mildly echogenic kidneys compatible with medical renal disease. No obstructive uropathy. Electronically Signed   By: Elon Alas M.D.   On: 06/13/2017 22:21     Impression / Plan:   Impression: 1.  Elevated LFTs: Unclear, question  shock liver, imaging as above, hepatitis panel as well as ANA pending 2.  Anemia and thrombocytopenia: Currently receiving blood transfusion, history of one melenic stool 2 days ago, none since; consider alcoholic gastropathy versus varices versus other 3.  AK I on CKD 3A: Traumatic AKI, unclear etiology at present, nephrology following 4.  Alcohol abuse: Half pint per day since October, has not drink in 4 days, prior to this 2 yrs of abstinence  Plan: 1.  Will schedule EGD for tomorrow given hx of melena and current anemia as well as hemoccult + stools.  Discussed risk, benefits, limitations and alternatives and the patient agreed to proceed. 2.  Continue to monitor hemoglobin with transfusion as needed. 3.  Continue to hold Xarelto, last dose 06/13/17 am. 4.  Patient will be n.p.o. after midnight. 5.  Agree with hepatitis panel and ANA 6.  Please await further recommendations from Dr. Hilarie Fredrickson later today  Thank you for your kind consultation, we will continue to follow.  Lavone Nian Berlie Persky  06/14/2017, 10:50 AM Pager #: [phone]

## 2017-06-15 ENCOUNTER — Encounter (HOSPITAL_COMMUNITY)
Admission: EM | Disposition: A | Discharge status: Home / Self Care | Payer: Self-pay | Source: Home / Self Care | Attending: Internal Medicine

## 2017-06-15 ENCOUNTER — Inpatient Hospital Stay (HOSPITAL_COMMUNITY): Payer: 59 | Admitting: Anesthesiology

## 2017-06-15 ENCOUNTER — Encounter (HOSPITAL_COMMUNITY): Payer: Self-pay | Admitting: *Deleted

## 2017-06-15 DIAGNOSIS — R42 Dizziness and giddiness: Secondary | ICD-10-CM

## 2017-06-15 HISTORY — PX: ESOPHAGOGASTRODUODENOSCOPY (EGD) WITH PROPOFOL: SHX5813

## 2017-06-15 LAB — HEPATITIS PANEL, ACUTE
HEP A IGM: NEGATIVE
HEP B S AG: NEGATIVE
Hep B C IgM: NEGATIVE

## 2017-06-15 LAB — RENAL FUNCTION PANEL
ANION GAP: 13 (ref 5–15)
Albumin: 2.7 g/dL — ABNORMAL LOW (ref 3.5–5.0)
BUN: 89 mg/dL — ABNORMAL HIGH (ref 6–20)
CALCIUM: 8.2 mg/dL — AB (ref 8.9–10.3)
CO2: 16 mmol/L — ABNORMAL LOW (ref 22–32)
CREATININE: 12.12 mg/dL — AB (ref 0.61–1.24)
Chloride: 108 mmol/L (ref 101–111)
GFR, EST AFRICAN AMERICAN: 4 mL/min — AB (ref >60)
GFR, EST NON AFRICAN AMERICAN: 4 mL/min — AB (ref >60)
Glucose, Bld: 96 mg/dL (ref 65–99)
Phosphorus: 4.6 mg/dL (ref 2.5–4.6)
Potassium: 3.6 mmol/L (ref 3.5–5.1)
SODIUM: 137 mmol/L (ref 135–145)

## 2017-06-15 LAB — PROTEIN ELECTROPHORESIS, SERUM
A/G Ratio: 1 (ref 0.7–1.7)
ALBUMIN ELP: 2.6 g/dL — AB (ref 2.9–4.4)
ALPHA-1-GLOBULIN: 0.3 g/dL (ref 0.0–0.4)
ALPHA-2-GLOBULIN: 0.5 g/dL (ref 0.4–1.0)
BETA GLOBULIN: 1.1 g/dL (ref 0.7–1.3)
GAMMA GLOBULIN: 0.8 g/dL (ref 0.4–1.8)
Globulin, Total: 2.6 g/dL (ref 2.2–3.9)
Total Protein ELP: 5.2 g/dL — ABNORMAL LOW (ref 6.0–8.5)

## 2017-06-15 LAB — KAPPA/LAMBDA LIGHT CHAINS
Kappa free light chain: 203.5 mg/L — ABNORMAL HIGH (ref 3.3–19.4)
Kappa, lambda light chain ratio: 1.62 (ref 0.26–1.65)
Lambda free light chains: 125.4 mg/L — ABNORMAL HIGH (ref 5.7–26.3)

## 2017-06-15 LAB — ANA W/REFLEX IF POSITIVE: Anti Nuclear Antibody(ANA): NEGATIVE

## 2017-06-15 LAB — C3 COMPLEMENT: C3 Complement: 135 mg/dL (ref 82–167)

## 2017-06-15 LAB — C4 COMPLEMENT: COMPLEMENT C4, BODY FLUID: 23 mg/dL (ref 14–44)

## 2017-06-15 SURGERY — ESOPHAGOGASTRODUODENOSCOPY (EGD) WITH PROPOFOL
Anesthesia: Monitor Anesthesia Care

## 2017-06-15 MED ORDER — PROPOFOL 10 MG/ML IV BOLUS
INTRAVENOUS | Status: AC
  Filled 2017-06-15: qty 40

## 2017-06-15 MED ORDER — SODIUM CHLORIDE 0.9 % IV SOLN
INTRAVENOUS | Status: DC
  Administered 2017-06-15: 14:00:00 via INTRAVENOUS

## 2017-06-15 MED ORDER — PROPOFOL 10 MG/ML IV BOLUS
INTRAVENOUS | Status: DC | PRN
  Administered 2017-06-15: 20 mg via INTRAVENOUS
  Administered 2017-06-15: 40 mg via INTRAVENOUS
  Administered 2017-06-15 (×3): 20 mg via INTRAVENOUS

## 2017-06-15 MED ORDER — PANTOPRAZOLE SODIUM 40 MG PO TBEC
40.0000 mg | DELAYED_RELEASE_TABLET | Freq: Every day | ORAL | Status: DC
  Administered 2017-06-16: 40 mg via ORAL
  Filled 2017-06-15: qty 1

## 2017-06-15 SURGICAL SUPPLY — 15 items

## 2017-06-15 NOTE — Anesthesia Postprocedure Evaluation (Signed)
Anesthesia Post Note  Patient: Charles [name]  Procedure(s) Performed: ESOPHAGOGASTRODUODENOSCOPY (EGD) WITH PROPOFOL (N/A )     Patient location during evaluation: PACU Anesthesia Type: MAC Level of consciousness: awake and alert and oriented Pain management: pain level controlled Vital Signs Assessment: post-procedure vital signs reviewed and stable Respiratory status: spontaneous breathing, nonlabored ventilation and respiratory function stable Cardiovascular status: stable and blood pressure returned to baseline Postop Assessment: no apparent nausea or vomiting Anesthetic complications: no    Last Vitals:  Vitals:   06/15/17 1354 06/15/17 1400  BP: 97/61 108/78  Pulse: 76 63  Resp: (!) 28 20  Temp: 36.6 C   SpO2: 98% 100%    Last Pain:  Vitals:   06/15/17 1400  TempSrc:   PainSc: 0-No pain                 Iverson Sees A.

## 2017-06-15 NOTE — Plan of Care (Signed)
  Problem: Education: Goal: Knowledge of General Education information will improve Outcome: Progressing   Problem: Health Behavior/Discharge Planning: Goal: Ability to manage health-related needs will improve Outcome: Progressing   Problem: Clinical Measurements: Goal: Ability to maintain clinical measurements within normal limits will improve Outcome: Progressing Goal: Will remain free from infection Outcome: Progressing Goal: Diagnostic test results will improve Outcome: Progressing Goal: Respiratory complications will improve Outcome: Not Applicable   Problem: Activity: Goal: Risk for activity intolerance will decrease Outcome: Progressing   Problem: Nutrition: Goal: Adequate nutrition will be maintained Outcome: Progressing   Problem: Coping: Goal: Level of anxiety will decrease Outcome: Progressing   Problem: Elimination: Goal: Will not experience complications related to bowel motility Outcome: Progressing Goal: Will not experience complications related to urinary retention Outcome: Progressing

## 2017-06-15 NOTE — Transfer of Care (Signed)
Immediate Anesthesia Transfer of Care Note  Patient: Charles [name]  Procedure(s) Performed: ESOPHAGOGASTRODUODENOSCOPY (EGD) WITH PROPOFOL (N/A )  Patient Location: PACU  Anesthesia Type:MAC  Level of Consciousness: awake, alert  and oriented  Airway & Oxygen Therapy: Patient Spontanous Breathing and Patient connected to face mask oxygen  Post-op Assessment: Report given to RN, Post -op Vital signs reviewed and stable and Patient moving all extremities  Post vital signs: Reviewed and stable  Last Vitals:  Vitals Value Taken Time  BP 97/61 06/15/2017  1:54 PM  Temp 36.6 C 06/15/2017  1:54 PM  Pulse 71 06/15/2017  1:55 PM  Resp 30 06/15/2017  1:55 PM  SpO2 100 % 06/15/2017  1:55 PM  Vitals shown include unvalidated device data.  Last Pain:  Vitals:   06/15/17 1354  TempSrc: Oral  PainSc: 0-No pain         Complications: No apparent anesthesia complications

## 2017-06-15 NOTE — Progress Notes (Signed)
Assessment/Plan  1. Nonoliguric AKI on CKD 3 inpt with AKI ? Hemodynamic with low BP on ACE-I  2. Anemia and thrombocytopenia, s/p PRBCsnow with GI bleed 3. EtOH abuse: recent EtOH withdrawal 4. Elevated LFTs: prob alcohol related    Subjective: Interval History: reports good UOP, s/p neg endo  Objective: Vital signs in last 24 hours: Temp:  [97.4 F (36.3 C)-99.2 F (37.3 C)] 98.3 F (36.8 C) (04/26 1234) Pulse Rate:  [57-80] 70 (04/26 1234) Resp:  [16-36] 20 (04/26 1234) BP: (98-133)/(42-94) 133/80 (04/26 1234) SpO2:  [62 %-100 %] 100 % (04/26 1234) Weight:  [170 lb 10.2 oz (77.4 kg)] 170 lb 10.2 oz (77.4 kg) (04/26 0423) Weight change:   Intake/Output from previous day: 04/25 0701 - 04/26 0700 In: 1069.3 [I.V.:676.3; Blood:393] Out: 1175 [Urine:1175] Intake/Output this shift: Total I/O In: 850 [I.V.:850] Out: 255 [Urine:255]  General appearance: alert and cooperative Cardio: regular rate and rhythm, S1, S2 normal, no murmur, click, rub or gallop GI: soft, non-tender; bowel sounds normal; no masses,  no organomegaly Extremities: extremities normal, atraumatic, no cyanosis or edema  Lab Results: Recent Labs    06/13/17 2345 06/14/17 0558 06/14/17 1544  WBC 9.0 9.1  --   HGB 7.0* 7.5* 9.7*  HCT 20.5* 21.4* 28.2*  PLT 60* 53*  --    BMET:  Recent Labs    06/14/17 0558 06/15/17 0701  NA 135 137  K 3.4* 3.6  CL 107 108  CO2 16* 16*  GLUCOSE 84 96  BUN 91* 89*  CREATININE 11.43* 12.12*  CALCIUM 8.1* 8.2*   No results for input(s): PTH in the last 72 hours. Iron Studies:  Recent Labs    06/13/17 1921  IRON 59  TIBC 266  FERRITIN 2,835*   Studies/Results: Ct Abdomen Pelvis Wo Contrast  Result Date: 06/13/2017 CLINICAL DATA:  Anemia generalized weakness and fatigue decreased appetite abnormal labs EXAM: CT ABDOMEN AND PELVIS WITHOUT CONTRAST TECHNIQUE: Multidetector CT imaging of the abdomen and pelvis was performed following the standard  protocol without IV contrast. COMPARISON:  PET-CT 06/11/2008, CT chest 10/24/2016 FINDINGS: Lower chest: Lung bases demonstrate scarring within the subpleural right lung base. Nodular subpleural densities in the left lower lobe, likely scarring. No acute consolidation or pleural effusion. Normal heart size. Small hiatal hernia. Hepatobiliary: No focal liver abnormality is seen. No gallstones, gallbladder wall thickening, or biliary dilatation. Pancreas: Unremarkable. No pancreatic ductal dilatation or surrounding inflammatory changes. Spleen: Normal in size without focal abnormality. Adrenals/Urinary Tract: Adrenal glands are within normal limits. No hydronephrosis. Small 2-3 mm stone lower pole left kidney. Possible cyst mid right kidney. Slightly thick-walled appearance of the bladder. Possible small posterior bladder diverticulum. Stomach/Bowel: Stomach is within normal limits. Appendix appears normal. No evidence of bowel wall thickening, distention, or inflammatory changes. Sigmoid colon diverticula without acute inflammation Vascular/Lymphatic: Moderate-to-marked aortic atherosclerosis. No aneurysmal dilatation. No significantly enlarged lymph nodes. Reproductive: Slightly enlarged prostate Other: Negative for free air or free fluid. Musculoskeletal: Degenerative changes. No acute or suspicious abnormality. IMPRESSION: 1. Negative for hydronephrosis or ureteral stone. Small nonobstructing stone in the left kidney. 2. Sigmoid colon diverticular disease without acute inflammation Electronically Signed   By: Donavan Foil M.D.   On: 06/13/2017 22:00   Dg Chest 2 View  Result Date: 06/13/2017 CLINICAL DATA:  Elevated creatinine with decreased urine output EXAM: CHEST - 2 VIEW COMPARISON:  CT 10/24/2016, radiograph 10/17/2016 FINDINGS: Hyperinflation with emphysematous disease. Stable pleural and parenchymal scarring and surgical changes on the right.  Small nodular suprahilar and upper lung, new since  10/17/2016 radiograph. Normal cardiomediastinal silhouette with aortic atherosclerosis. No pneumothorax. Degenerative changes of the spine. IMPRESSION: No active cardiopulmonary disease. Nodular opacities in the right upper lung, possible evolution of surgical changes. Electronically Signed   By: Donavan Foil M.D.   On: 06/13/2017 21:49   US Renal  Result Date: 06/13/2017 CLINICAL DATA:  Acute kidney injury. History of nephrolithiasis, benign prostatic hypertrophy and hypertension. EXAM: RENAL / URINARY TRACT ULTRASOUND COMPLETE COMPARISON:  CT abdomen and pelvis June 13, 2017 FINDINGS: Right Kidney: Length: 11 cm. Mildly increased cortical echogenicity. No suspicious mass or hydronephrosis visualized. 18 mm anechoic cyst upper pole with increased through transmission. Left Kidney: Length: 11.2 cm. Mildly increased cortical echogenicity. No suspicious mass or hydronephrosis visualized. 18 mm anechoic cyst upper pole with increased through transmission. Known nephrolithiasis not apparent by routine sonography. Bladder: Appears normal for degree of bladder distention. Prostate measures 4.4 x 3.5 x 5.5 cm. IMPRESSION: Mildly echogenic kidneys compatible with medical renal disease. No obstructive uropathy. Electronically Signed   By: Elon Alas M.D.   On: 06/13/2017 22:21    Scheduled: . [MAR Hold] folic acid  1 mg Oral Daily  . [MAR Hold] multivitamin with minerals  1 tablet Oral Daily  . [MAR Hold] sodium bicarbonate  650 mg Oral TID  . [MAR Hold] thiamine  100 mg Oral Daily   Or  . [MAR Hold] thiamine  100 mg Intravenous Daily   Continuous: . sodium chloride    . dextrose 5 % and 0.45% NaCl 50 mL/hr at 06/15/17 1128  . [MAR Hold] famotidine (PEPCID) IV Stopped (06/14/17 2147)  . [MAR Hold] sodium chloride       LOS: 2 days   [name] 06/15/2017,1:28 PM

## 2017-06-15 NOTE — Progress Notes (Addendum)
PROGRESS NOTE    Charles [name]  AJO:[id number] DOB: 1950-07-01 DOA: 06/13/2017 PCP: Biagio Borg, MD    Brief Narrative:  67 year old male who presented with worsening kidney function.  He does have the significant past medical history of COPD, BPH, cerebellar CVA, history of DVT/PE status post IVC filter, hypertension, dyslipidemia and history of spontaneous pneumothorax.  For the last 5 days prior to hospitalization, patient experienced withdrawal symptoms consistent with nausea, decreased p.o. intake and difficulty ambulating.  He was seen by his primary care physician who found him in acute renal failure, he was referred to the hospital for further evaluation.  On the initial physical examination blood pressure 96/60, heart rate 90, respiratory 15, temperature 98.3, oxygen saturation 100%.  Dry mucous membranes, lungs clear to auscultation bilaterally, heart S1-S2 present rhythmic, the abdomen soft nontender, no lower extremity edema.  Sodium 133, potassium 3.9, chloride 106, bicarb 18, glucose 105, BUN 93, creatinine 11.16, white count 9.6, hemoglobin 7.3, hematocrit 20.4, platelets 62.  Urinalysis pH 5.0, protein 100, specific gravity 1.009, RBCs greater than 50, white cells 21-50, urine sodium 33, fecal occult blood positive.  CT of the abdomen negative for hydronephrosis or ureteral stone.  Chest x-ray with hyperinflation right base scaring (chronic).  EKG normal sinus rhythm, normal axis normal intervals.  Patient was admitted to the hospital with the working diagnosis of worsening renal failure, acute kidney injury, complicated of symptomatic anemia, to rule out acute blood loss, due to upper GI bleed.   Assessment & Plan:   Active Problems:   Hyperlipidemia   Unspecified Peripheral Vascular Disease   GERD   Abnormal LFTs   BPH (benign prostatic hyperplasia)   Anemia   Gross hematuria   Acute renal failure superimposed on stage 3 chronic kidney disease (HCC)   Dehydration  Symptomatic anemia   COPD not affecting current episode of care ([place])   Hx of pulmonary embolus   Thrombocytopenia (HCC)   Alcohol abuse   Acute renal failure (ARF) (HCC)   AKI (acute kidney injury) ([place])   Melena   1.  Acute kidney injury due to ATN, with non anion gap metabolic acidosis. Patient continue to be hemodynamically stable, urine output over last 24 hours, up to 1,175 ml, no signs of volume overload or uremia. Basic metabolic panel pending this am. No clinical indication for HD. Will continue to follow up renal function and urine output. Suspected ATN related to hypotension at home. Continue to hold on antihypertensive agents. Avoid nephrotoxic medications. Follow serologies. Normal complement. Will continue gentle hydration with hypotonic saline and dextrose. Continue oral bicarbonate.   2.  Alcohol abuse. Currently with no active withdrawal symptoms. Continue gentle hydration with dextrose and half normal saline. Continue benzodiazepine per protocol CIWA and thiamine. Out of bed to chair, ambulate with physical therapy.   3.  Hypotension. Systolic blood pressure 947 to 120 mmHg, continue close monitoring. Hold on antihypertensive agents.   4.  Symptomatic anemia, possible acute blood loss due to upper GI bleed, in the setting of anemia of chronic disease. Sp one unit prbc transfusion, hb up to 9.7 and hct up to 28.2, no signs of bleeding, will continue antiacid therapy, will follow on endoscopic procedure today.  5. Hx of DVT and PE. Continue holding anticoagulation, will resume depending on EGD results.   6. Elevated liver enzymes. Suspected hypoperfusion related, liver shock, no encephalopathy or signs of liver failure.   DVT prophylaxis: scd  Code Status:  full Family Communication:  no family at the bedside  Disposition Plan:  Home when recovery of renal function.  Transfer to medical unit, discontinue cardiac monitoring.   Consultants:   Nephrology    GI  Procedures:     Antimicrobials    Subjective: Patient is feeling well, no dyspnea, no chest pain, no headache, no tremors or anxiety, no nausea or vomiting, tolerating po well.   Objective: Vitals:   06/15/17 0423 06/15/17 0500 06/15/17 0600 06/15/17 0700  BP:  (!) 129/49 (!) 101/58   Pulse:  (!) 59 (!) 57 69  Resp:  16 16 20   Temp: (!) 97.5 F (36.4 C)     TempSrc: Oral     SpO2:  100% 98% 100%  Weight: 77.4 kg (170 lb 10.2 oz)     Height:        Intake/Output Summary (Last 24 hours) at 06/15/2017 0749 Last data filed at 06/15/2017 0500 Gross per 24 hour  Intake 1069.25 ml  Output 1175 ml  Net -105.75 ml   Filed Weights   06/14/17 0825 06/15/17 0423  Weight: 75 kg (165 lb 5.5 oz) 77.4 kg (170 lb 10.2 oz)    Examination:   General: deconditioned Neurology: Awake and alert, non focal  E ENT: no pallor, no icterus, oral mucosa moist Cardiovascular: No JVD. S1-S2 present, rhythmic, no gallops, rubs, or murmurs. No lower extremity edema. Pulmonary: vesicular breath sounds bilaterally, adequate air movement, no wheezing, rhonchi or rales. Gastrointestinal. Abdomen with no organomegaly, non tender, no rebound or guarding Skin. No rashes Musculoskeletal: no joint deformities     Data Reviewed: I have personally reviewed following labs and imaging studies  CBC: Recent Labs  Lab 06/13/17 1047 06/13/17 1736 06/13/17 2142 06/13/17 2345 06/14/17 0558 06/14/17 1544  WBC 8.5 9.6  --  9.0 9.1  --   NEUTROABS 5.8  --  6.9  --   --   --   HGB 8.2 Repeated and verified X2.* 7.3*  --  7.0* 7.5* 9.7*  HCT 23.5 Repeated and verified X2.* 20.4*  --  20.5* 21.4* 28.2*  MCV 101.1* 97.1  --  96.7 94.3  --   PLT 62.0* 62*  --  60* 53*  --    Basic Metabolic Panel: Recent Labs  Lab 06/13/17 1047 06/13/17 1736 06/13/17 2345 06/14/17 0558  NA 134* 133* 134* 135  K 3.7 3.9 3.5 3.4*  CL 103 106 106 107  CO2 18* 18* 15* 16*  GLUCOSE 148* 105* 101* 84  BUN  84* 93* 95* 91*  CREATININE 10.59* 11.16* 11.34* 11.43*  CALCIUM 8.1* 8.2* 8.2* 8.1*  MG  --   --   --  1.9  PHOS  --   --   --  3.9   GFR: Estimated Creatinine Clearance: 7.1 mL/min (A) (by C-G formula based on SCr of 11.43 mg/dL (H)). Liver Function Tests: Recent Labs  Lab 06/13/17 1047 06/13/17 1921 06/13/17 2142 06/14/17 0558  AST 335* 259*  --  163*  ALT 474* 450*  --  329*  ALKPHOS 94 84  --  70  BILITOT 2.0* 1.9* 1.6* 1.5*  PROT 6.0 7.1  --  6.0*  ALBUMIN 3.0* 3.0*  --  2.7*   No results for input(s): LIPASE, AMYLASE in the last 168 hours. No results for input(s): AMMONIA in the last 168 hours. Coagulation Profile: Recent Labs  Lab 06/13/17 2003  INR 1.40   Cardiac Enzymes: Recent Labs  Lab 06/13/17 2003 06/13/17 2243 06/14/17 6301  CKTOTAL 119  --   --   TROPONINI  --  <0.03 <0.03   BNP (last 3 results) No results for input(s): PROBNP in the last 8760 hours. HbA1C: Recent Labs    06/13/17 1047  HGBA1C 4.6   CBG: No results for input(s): GLUCAP in the last 168 hours. Lipid Profile: Recent Labs    06/13/17 1047  CHOL 157  HDL 22.70*  TRIG 343.0*  CHOLHDL 7  LDLDIRECT 91.0   Thyroid Function Tests: Recent Labs    06/14/17 0558  TSH 2.917   Anemia Panel: Recent Labs    06/13/17 1921  VITAMINB12 1,124*  FOLATE 15.4  FERRITIN 2,835*  TIBC 266  IRON 59  RETICCTPCT 3.0      Radiology Studies: I have reviewed all of the imaging during this hospital visit personally     Scheduled Meds: . folic acid  1 mg Oral Daily  . multivitamin with minerals  1 tablet Oral Daily  . sodium bicarbonate  650 mg Oral TID  . thiamine  100 mg Oral Daily   Or  . thiamine  100 mg Intravenous Daily   Continuous Infusions: . dextrose 5 % and 0.45% NaCl 75 mL/hr at 06/14/17 2156  . famotidine (PEPCID) IV Stopped (06/14/17 2147)  . sodium chloride       LOS: 2 days        Tawni Millers, MD [facility] Pager  [phone]

## 2017-06-15 NOTE — Op Note (Signed)
[facility] Patient Name: Charles [name] Procedure Date: 06/15/2017 MRN: [id number] Attending MD: Jerene Bears , MD Date of Birth: [birth year] CSN: [id number] Age: 67 Admit Type: Inpatient Procedure:                Upper GI endoscopy Indications:              Acute post hemorrhagic anemia, Melena Providers:                Lajuan Lines. Hilarie Fredrickson, MD, Elna Breslow, RN, Tinnie Gens,                            Technician Referring MD:             [facility] Medicines:                Monitored Anesthesia Care Complications:            No immediate complications. Estimated Blood Loss:     Estimated blood loss: none. Procedure:                Pre-Anesthesia Assessment:                           - Prior to the procedure, a History and Physical                            was performed, and patient medications and                            allergies were reviewed. The patient's tolerance of                            previous anesthesia was also reviewed. The risks                            and benefits of the procedure and the sedation                            options and risks were discussed with the patient.                            All questions were answered, and informed consent                            was obtained. Prior Anticoagulants: The patient has                            taken Eliquis (apixaban), last dose was 2 days                            prior to procedure. ASA Grade Assessment: III - A                            patient with severe systemic disease. After  reviewing the risks and benefits, the patient was                            deemed in satisfactory condition to undergo the                            procedure.                           After obtaining informed consent, the endoscope was                            passed under direct vision. Throughout the                            procedure, the patient's blood  pressure, pulse, and                            oxygen saturations were monitored continuously. The                            Endoscope was introduced through the mouth, and                            advanced to the second part of duodenum. The upper                            GI endoscopy was accomplished without difficulty.                            The patient tolerated the procedure well. Scope In: Scope Out: Findings:      The examined esophagus was normal.      A 1 cm hiatal hernia was present.      The entire examined stomach was normal.      The examined duodenum was normal. Impression:               - Normal esophagus.                           - 1 cm hiatal hernia.                           - Normal stomach.                           - Normal examined duodenum.                           - No evidence of active or recent bleeding in the                            upper GI tract.                           - No specimens collected. Moderate Sedation:      N/A  Recommendation:           - Return patient to hospital ward for ongoing care.                           - Advance diet as tolerated.                           - Continue present medications.                           - Once daily PPI.                           - Patient has been advised to stop alcohol                            permanently.                           - Monitor for recurrent melena and if this occurs,                            colonoscopy is recommended.                           - Monitor LFTs (which have been improving).                           - GI office visit recommended after discharge for                            continuity.                           - GI available, call with questions. Procedure Code(s):        --- Professional ---                           [phone], Esophagogastroduodenoscopy, flexible,                            transoral; diagnostic, including collection of                             specimen(s) by brushing or washing, when performed                            (separate procedure) Diagnosis Code(s):        --- Professional ---                           K44.9, Diaphragmatic hernia without obstruction or                            gangrene  D62, Acute posthemorrhagic anemia                           K92.1, Melena (includes Hematochezia) CPT copyright 2017 [employer]. All rights reserved. The codes documented in this report are preliminary and upon coder review may  be revised to meet current compliance requirements. Jerene Bears, MD 06/15/2017 2:34:33 PM This report has been signed electronically. Number of Addenda: 0

## 2017-06-15 NOTE — Interval H&P Note (Signed)
History and Physical Interval Note: For EGD today to eval recent melena and acute on chronic anemia Hgb better with transfusion, 9.7 today The nature of the procedure, as well as the risks, benefits, and alternatives were carefully and thoroughly reviewed with the patient. Ample time for discussion and questions allowed. The patient understood, was satisfied, and agreed to proceed.      Lab Results  Component Value Date   INR 1.40 06/13/2017   INR 0.98 07/31/2016   INR 1.25 07/23/2016      CBC Latest Ref Rng & Units 06/14/2017 06/14/2017 06/13/2017  WBC 4.0 - 10.5 K/uL - 9.1 9.0  Hemoglobin 13.0 - 17.0 g/dL 9.7(L) 7.5(L) 7.0(L)  Hematocrit 39.0 - 52.0 % 28.2(L) 21.4(L) 20.5(L)  Platelets 150 - 400 K/uL - 53(L) 60(L)    06/15/2017 1:15 PM  Charles [name]  has presented today for surgery, with the diagnosis of anemia  The various methods of treatment have been discussed with the patient and family. After consideration of risks, benefits and other options for treatment, the patient has consented to  Procedure(s): ESOPHAGOGASTRODUODENOSCOPY (EGD) WITH PROPOFOL (N/A) as a surgical intervention .  The patient's history has been reviewed, patient examined, no change in status, stable for surgery.  I have reviewed the patient's chart and labs.  Questions were answered to the patient's satisfaction.     Lajuan Lines Casey Fye

## 2017-06-15 NOTE — Anesthesia Preprocedure Evaluation (Signed)
Anesthesia Evaluation  Patient identified by MRN, date of birth, ID band Patient awake    Reviewed: Allergy & Precautions, NPO status , Patient's Chart, lab work & pertinent test results, reviewed documented beta blocker date and time   Airway Mallampati: II  TM Distance: >3 FB Neck ROM: Full    Dental  (+) Poor Dentition   Pulmonary pneumonia, resolved, COPD, former smoker,  Hx/o pulmonary abscess   breath sounds clear to auscultation+ rhonchi  + decreased breath sounds      Cardiovascular hypertension, Pt. on medications + CAD and + Peripheral Vascular Disease   Rhythm:Regular Rate:Tachycardia  Hx/o DVT and PTE S/P IVC filter   Neuro/Psych  Headaches, PSYCHIATRIC DISORDERS Cerebellar stroke Bilateral hearing loss Dysarthria  CVA, Residual Symptoms    GI/Hepatic GERD  ,(+)     substance abuse  alcohol use, Melena IDA   Endo/Other  Hyperlipidemia   Renal/GU ARF and CRFRenal diseaseHx/o AKI   ED    Musculoskeletal negative musculoskeletal ROS (+)   Abdominal   Peds  Hematology  (+) anemia , Thrombocytopenia Xarelto therapy- last dose 06/13/2017   Anesthesia Other Findings   Reproductive/Obstetrics                             Anesthesia Physical  Anesthesia Plan  ASA: IV  Anesthesia Plan: MAC   Post-op Pain Management:    Induction: Intravenous  PONV Risk Score and Plan:   Airway Management Planned: Natural Airway and Nasal Cannula  Additional Equipment:   Intra-op Plan:   Post-operative Plan:   Informed Consent: I have reviewed the patients History and Physical, chart, labs and discussed the procedure including the risks, benefits and alternatives for the proposed anesthesia with the patient or authorized representative who has indicated his/her understanding and acceptance.     Plan Discussed with: Anesthesiologist, CRNA and Surgeon  Anesthesia Plan Comments:          Anesthesia Quick Evaluation

## 2017-06-15 NOTE — Progress Notes (Signed)
RN called to give report to nurse who will be taking care of patient once transferred to 1322. Nurse busy in another patient room. Will call back later.

## 2017-06-16 DIAGNOSIS — R5383 Other fatigue: Secondary | ICD-10-CM

## 2017-06-16 LAB — BASIC METABOLIC PANEL
Anion gap: 14 (ref 5–15)
BUN: 89 mg/dL — AB (ref 6–20)
CHLORIDE: 111 mmol/L (ref 101–111)
CO2: 14 mmol/L — ABNORMAL LOW (ref 22–32)
Calcium: 8 mg/dL — ABNORMAL LOW (ref 8.9–10.3)
Creatinine, Ser: 12.41 mg/dL — ABNORMAL HIGH (ref 0.61–1.24)
GFR calc Af Amer: 4 mL/min — ABNORMAL LOW (ref >60)
GFR, EST NON AFRICAN AMERICAN: 4 mL/min — AB (ref >60)
GLUCOSE: 92 mg/dL (ref 65–99)
POTASSIUM: 3.8 mmol/L (ref 3.5–5.1)
Sodium: 139 mmol/L (ref 135–145)

## 2017-06-16 MED ORDER — FAMOTIDINE 20 MG PO TABS
10.0000 mg | ORAL_TABLET | Freq: Every day | ORAL | Status: DC
  Administered 2017-06-16 – 2017-06-19 (×4): 10 mg via ORAL
  Filled 2017-06-16 (×4): qty 1

## 2017-06-16 MED ORDER — SOD CITRATE-CITRIC ACID 500-334 MG/5ML PO SOLN
30.0000 mL | Freq: Three times a day (TID) | ORAL | Status: DC
  Administered 2017-06-16 – 2017-06-18 (×9): 30 mL via ORAL
  Filled 2017-06-16 (×10): qty 30

## 2017-06-16 MED ORDER — FAMOTIDINE 20 MG PO TABS: 40.0000 mg | ORAL_TABLET | Freq: Every day | ORAL | Status: DC

## 2017-06-16 NOTE — Progress Notes (Signed)
PROGRESS NOTE    Charles [name]  XAJ:[id number] DOB: 02-04-1951 DOA: 06/13/2017 PCP: Biagio Borg, MD    Brief Narrative:  68 year old male who presented with worsening kidney function. He does have thesignificant past medical history of COPD, BPH, cerebellar CVA, history of DVT/PE status post IVC filter, hypertension, dyslipidemia and history of spontaneous pneumothorax. For the last 5 days prior to hospitalization, patient experienced withdrawal symptoms consistent with nausea, decreased p.o. intake and difficulty ambulating.He was seen by his primary care physician who found him in acute renal failure, hewas referred to the hospital for further evaluation. On the initial physical examination blood pressure 96/60, heart rate 90, respiratory 15, temperature 98.3, oxygen saturation 100%. Dry mucous membranes, lungs clear to auscultation bilaterally, heart S1-S2 present rhythmic, the abdomen soft nontender, no lower extremity edema. Sodium 133, potassium 3.9, chloride 106, bicarb 18, glucose 105, BUN 93, creatinine 11.16, white count 9.6, hemoglobin 7.3,hematocrit 20.4, platelets 62.Urinalysis pH 5.0, protein 100, specific gravity 1.009, RBCs greater than 50, white cells 21-50,urine sodium 33,fecal occult blood positive.CT of the abdomen negative for hydronephrosis or ureteral stone.Chest x-ray with hyperinflation right base scaring (chronic).EKG normal sinus rhythm, normal axis normal intervals.  Patient was admitted to the hospitalwith theworking diagnosis of worsening renal failure,acute kidney injury, complicated of symptomatic anemia, to rule out acute blood loss, due to upper GI bleed.  Assessment & Plan:   Active Problems:   Hyperlipidemia   Unspecified Peripheral Vascular Disease   GERD   Abnormal LFTs   BPH (benign prostatic hyperplasia)   Anemia   Gross hematuria   Acute renal failure superimposed on stage 3 chronic kidney disease (HCC)   Dehydration  Symptomatic anemia   COPD not affecting current episode of care ([place])   Hx of pulmonary embolus   Thrombocytopenia (HCC)   Alcohol abuse   Acute renal failure (ARF) (HCC)   AKI (acute kidney injury) ([place])   Melena  1.Acute non oliguric kidney injury due to ATN, with non anion gap metabolic acidosis. Urine output 14800 over last 24 hours, patient with no signs of volume overload or uremia. Tolerating po well, will discontinue IV fluids. Worsening non gap metabolic acidosis, will change bicarb to bicitra and will follow on renal panel in am. Continue to hold on nephrotoxic medications. Serum K at 3,8 and serum bicarbonate down to 14,   2.Alcohol abuse. No active withdrawal symptoms. Patient on benzodiazepine per protocol CIWA and thiamine. Has not required any dose of lorazepam since admission.   3.Hypotension. Systolic blood pressure 094 mmHg. Holding lisinopril and amlodipine.   4.Symptomatic anemia,possible acute blood loss due to upper GI bleed, in the setting of anemia of chronic disease. Sp one unit prbc transfusion. Upper endoscopy with no active bleeding, will continue H2 blocker, will avoid proton pump inhibitor due to renal failure.   5. Hx of DVT and PE. Patient had a IVC filter placed in the past, for now will continue to hold on novel oral anticoagulation due to low GFR.   6. Liver shock with elevated liver enzymes. Follow on liver panel in am, continue to avoid hypotension.    DVT prophylaxis:scd Code Status:full Family Communication:no family at the bedside Disposition Plan:Home when recovery of renal function.   Consultants:  Nephrology   GI  Procedures:    Antimicrobials   Subjective: Patient continue to feel better, getting close to his baseline, no nausea or vomiting, tolerating po well, no chest pain or dyspnea.   Objective: Vitals:   06/15/17  1400 06/15/17 1506 06/15/17 2205 06/16/17 0548  BP: 108/78 132/68 128/74 (!)  128/93  Pulse: 63 60 66 69  Resp: 20 16 16 14   Temp:  98.4 F (36.9 C) 98.3 F (36.8 C) 98.8 F (37.1 C)  TempSrc:  Oral Oral Oral  SpO2: 100% 100% 100% 99%  Weight:      Height:        Intake/Output Summary (Last 24 hours) at 06/16/2017 0944 Last data filed at 06/16/2017 0092 Gross per 24 hour  Intake 760 ml  Output 1700 ml  Net -940 ml   Filed Weights   06/14/17 0825 06/15/17 0423  Weight: 75 kg (165 lb 5.5 oz) 77.4 kg (170 lb 10.2 oz)    Examination:   General: Not in pain or dyspnea, deconditioned Neurology: Awake and alert, non focal  E ENT: mild pallor, no icterus, oral mucosa moist Cardiovascular: No JVD. S1-S2 present, rhythmic, no gallops, rubs, or murmurs. No lower extremity edema. Pulmonary: vesicular breath sounds bilaterally, adequate air movement, no wheezing, rhonchi or rales. Gastrointestinal. Abdomen with no organomegaly, non tender, no rebound or guarding Skin. No rashes Musculoskeletal: no joint deformities     Data Reviewed: I have personally reviewed following labs and imaging studies  CBC: Recent Labs  Lab 06/13/17 1047 06/13/17 1736 06/13/17 2142 06/13/17 2345 06/14/17 0558 06/14/17 1544  WBC 8.5 9.6  --  9.0 9.1  --   NEUTROABS 5.8  --  6.9  --   --   --   HGB 8.2 Repeated and verified X2.* 7.3*  --  7.0* 7.5* 9.7*  HCT 23.5 Repeated and verified X2.* 20.4*  --  20.5* 21.4* 28.2*  MCV 101.1* 97.1  --  96.7 94.3  --   PLT 62.0* 62*  --  60* 53*  --    Basic Metabolic Panel: Recent Labs  Lab 06/13/17 1736 06/13/17 2345 06/14/17 0558 06/15/17 0701 06/16/17 0340  NA 133* 134* 135 137 139  K 3.9 3.5 3.4* 3.6 3.8  CL 106 106 107 108 111  CO2 18* 15* 16* 16* 14*  GLUCOSE 105* 101* 84 96 92  BUN 93* 95* 91* 89* 89*  CREATININE 11.16* 11.34* 11.43* 12.12* 12.41*  CALCIUM 8.2* 8.2* 8.1* 8.2* 8.0*  MG  --   --  1.9  --   --   PHOS  --   --  3.9 4.6  --    GFR: Estimated Creatinine Clearance: 6.6 mL/min (A) (by C-G formula based  on SCr of 12.41 mg/dL (H)). Liver Function Tests: Recent Labs  Lab 06/13/17 1047 06/13/17 1921 06/13/17 2142 06/14/17 0558 06/15/17 0701  AST 335* 259*  --  163*  --   ALT 474* 450*  --  329*  --   ALKPHOS 94 84  --  70  --   BILITOT 2.0* 1.9* 1.6* 1.5*  --   PROT 6.0 7.1  --  6.0*  --   ALBUMIN 3.0* 3.0*  --  2.7* 2.7*   No results for input(s): LIPASE, AMYLASE in the last 168 hours. No results for input(s): AMMONIA in the last 168 hours. Coagulation Profile: Recent Labs  Lab 06/13/17 2003  INR 1.40   Cardiac Enzymes: Recent Labs  Lab 06/13/17 2003 06/13/17 2243 06/14/17 0558  CKTOTAL 119  --   --   TROPONINI  --  <0.03 <0.03   BNP (last 3 results) No results for input(s): PROBNP in the last 8760 hours. HbA1C: Recent Labs    06/13/17  1047  HGBA1C 4.6   CBG: No results for input(s): GLUCAP in the last 168 hours. Lipid Profile: Recent Labs    06/13/17 1047  CHOL 157  HDL 22.70*  TRIG 343.0*  CHOLHDL 7  LDLDIRECT 91.0   Thyroid Function Tests: Recent Labs    06/14/17 0558  TSH 2.917   Anemia Panel: Recent Labs    06/13/17 1921  VITAMINB12 1,124*  FOLATE 15.4  FERRITIN 2,835*  TIBC 266  IRON 59  RETICCTPCT 3.0      Radiology Studies: I have reviewed all of the imaging during this hospital visit personally     Scheduled Meds: . folic acid  1 mg Oral Daily  . multivitamin with minerals  1 tablet Oral Daily  . pantoprazole  40 mg Oral Q0600  . sodium bicarbonate  650 mg Oral TID  . thiamine  100 mg Oral Daily   Or  . thiamine  100 mg Intravenous Daily   Continuous Infusions: . dextrose 5 % and 0.45% NaCl 50 mL/hr at 06/16/17 0525  . sodium chloride       LOS: 3 days        Tawni Millers, MD [facility] Pager [phone]

## 2017-06-16 NOTE — Evaluation (Signed)
Physical Therapy Evaluation Patient Details Name: Charles [name] MRN: [id number] DOB: 18-May-1950 Today's Date: 06/16/2017   History of Present Illness  67 yo male with AKI, acute renal failure and abnormal LFT's was admitted. Had GI bleed, symptomatic anemia, PMHx:  COPD, CVA, DVT, PE, IVC filter, HTN, PVD, EtOH abuse  Clinical Impression  Pt was assisted to walk with some difficulty on his part for controlling his LE movement and managing loss of balance.  Pt is expecting to go home with wife so will work on his mobility and safety with RW, and will transition the same for HHPT.  Pt is having some appearances of anxiety but very motivated to work toward getting home.  Nursing aware and assisting with managing his symptoms.    Follow Up Recommendations Home health PT;Supervision - Intermittent    Equipment Recommendations  Rolling walker with 5" wheels    Recommendations for Other Services       Precautions / Restrictions Precautions Precautions: Fall Precaution Comments: monitor vitals Restrictions Weight Bearing Restrictions: No      Mobility  Bed Mobility Overal bed mobility: Modified Independent                Transfers Overall transfer level: Modified independent Equipment used: Rolling walker (2 wheeled)             General transfer comment: reminders for hand placement  Ambulation/Gait Ambulation/Gait assistance: Min guard Ambulation Distance (Feet): 150 Feet Assistive device: Rolling walker (2 wheeled);1 person hand held assist Gait Pattern/deviations: Step-through pattern;Decreased stride length;Narrow base of support;Trunk flexed Gait velocity: reduced Gait velocity interpretation: <1.31 ft/sec, indicative of household ambulator General Gait Details: Pt is stepping in short strides with dificulty maintaining upright posture and control of the walker  Stairs            Wheelchair Mobility    Modified Rankin (Stroke Patients Only)        Balance Overall balance assessment: Needs assistance Sitting-balance support: Feet supported Sitting balance-Leahy Scale: Good     Standing balance support: Bilateral upper extremity supported;During functional activity Standing balance-Leahy Scale: Fair Standing balance comment: less than fair balance with dynamic movement                             Pertinent Vitals/Pain Pain Assessment: No/denies pain    Home Living Family/patient expects to be discharged to:: Private residence Living Arrangements: Spouse/significant other Available Help at Discharge: Family;Available 24 hours/day Type of Home: House Home Access: Stairs to enter   [employer] of Steps: 2 Home Layout: Two level;Able to live on main level with bedroom/bathroom Home Equipment: None      Prior Function Level of Independence: Independent         Comments: Drives     Hand Dominance   Dominant Hand: Right    Extremity/Trunk Assessment   Upper Extremity Assessment Upper Extremity Assessment: Overall WFL for tasks assessed    Lower Extremity Assessment Lower Extremity Assessment: Generalized weakness    Cervical / Trunk Assessment Cervical / Trunk Assessment: Normal  Communication   Communication: No difficulties  Cognition Arousal/Alertness: Awake/alert Behavior During Therapy: WFL for tasks assessed/performed Overall Cognitive Status: Within Functional Limits for tasks assessed                                        General  Comments      Exercises     Assessment/Plan    PT Assessment Patient needs continued PT services  PT Problem List Decreased strength;Decreased range of motion;Decreased activity tolerance;Decreased balance;Decreased mobility;Decreased coordination;Decreased knowledge of use of DME;Decreased safety awareness;Cardiopulmonary status limiting activity       PT Treatment Interventions DME instruction;Gait training;Stair  training;Functional mobility training;Therapeutic activities;Therapeutic exercise;Balance training;Neuromuscular re-education;Patient/family education    PT Goals (Current goals can be found in the Care Plan section)  Acute Rehab PT Goals PT Goal Formulation: With patient/family Time For Goal Achievement: 06/30/17 Potential to Achieve Goals: Good    Frequency Min 3X/week   Barriers to discharge Inaccessible home environment;Decreased caregiver support home with wife with stairs on his house    Co-evaluation               AM-PAC PT "6 Clicks" Daily Activity  Outcome Measure Difficulty turning over in bed (including adjusting bedclothes, sheets and blankets)?: A Lot Difficulty moving from lying on back to sitting on the side of the bed? : A Lot Difficulty sitting down on and standing up from a chair with arms (e.g., wheelchair, bedside commode, etc,.)?: Unable Help needed moving to and from a bed to chair (including a wheelchair)?: A Little Help needed walking in hospital room?: A Little Help needed climbing 3-5 steps with a railing? : A Lot 6 Click Score: 13    End of Session Equipment Utilized During Treatment: Gait belt Activity Tolerance: Patient tolerated treatment well;Patient limited by fatigue Patient left: in bed;with call bell/phone within reach;with bed alarm set Nurse Communication: Mobility status PT Visit Diagnosis: Unsteadiness on feet (R26.81);Muscle weakness (generalized) (M62.81);Difficulty in walking, not elsewhere classified (R26.2);Ataxic gait (R26.0)    Time: 3295-1884 PT Time Calculation (min) (ACUTE ONLY): 25 min   Charges:   PT Evaluation $PT Eval Moderate Complexity: 1 Mod PT Treatments $Gait Training: 8-22 mins   PT G Codes:        Ramond Dial Jun 26, 2017, 10:25 PM   Mee Hives, PT MS Acute Rehab Dept. Number: [place] [facility] [place]

## 2017-06-16 NOTE — Progress Notes (Signed)
Assessment/Plan  1. Nonoliguric AKI on CKD 3 inpt with AKI? Hemodynamic with low BP on ACE-I---follow for recovery 2. Anemia and thrombocytopenia, s/p PRBCsnow with GI bleed 3. EtOH abuse:recentEtOH withdrawal 4. Elevated LFTs  Subjective: Interval History: no uremic symptoms  Objective: Vital signs in last 24 hours: Temp:  [97.8 F (36.6 C)-98.8 F (37.1 C)] 98.8 F (37.1 C) (04/27 0548) Pulse Rate:  [60-76] 69 (04/27 0548) Resp:  [14-28] 14 (04/27 0548) BP: (97-133)/(61-93) 128/93 (04/27 0548) SpO2:  [96 %-100 %] 99 % (04/27 0548) Weight change:   Intake/Output from previous day: 04/26 0701 - 04/27 0700 In: 1370 [P.O.:120; I.V.:1250] Out: 1480 [Urine:1480] Intake/Output this shift: No intake/output data recorded.  General appearance: alert and cooperative Resp: clear to auscultation bilaterally Chest wall: no tenderness Cardio: regular rate and rhythm, S1, S2 normal, no murmur, click, rub or gallop Extremities: extremities normal, atraumatic, no cyanosis or edema  Lab Results: Recent Labs    06/13/17 2345 06/14/17 0558 06/14/17 1544  WBC 9.0 9.1  --   HGB 7.0* 7.5* 9.7*  HCT 20.5* 21.4* 28.2*  PLT 60* 53*  --    BMET:  Recent Labs    06/15/17 0701 06/16/17 0340  NA 137 139  K 3.6 3.8  CL 108 111  CO2 16* 14*  GLUCOSE 96 92  BUN 89* 89*  CREATININE 12.12* 12.41*  CALCIUM 8.2* 8.0*   No results for input(s): PTH in the last 72 hours. Iron Studies:  Recent Labs    06/13/17 1921  IRON 59  TIBC 266  FERRITIN 2,835*   Studies/Results: No results found.  Scheduled: . folic acid  1 mg Oral Daily  . multivitamin with minerals  1 tablet Oral Daily  . pantoprazole  40 mg Oral Q0600  . sodium bicarbonate  650 mg Oral TID  . thiamine  100 mg Oral Daily   Or  . thiamine  100 mg Intravenous Daily      LOS: 3 days   [name] 06/16/2017,7:30 AM

## 2017-06-17 LAB — TYPE AND SCREEN
ABO/RH(D): B POS
ANTIBODY SCREEN: NEGATIVE
UNIT DIVISION: 0
Unit division: 0
Unit division: 0

## 2017-06-17 LAB — BASIC METABOLIC PANEL
Anion gap: 14 (ref 5–15)
BUN: 87 mg/dL — AB (ref 6–20)
CO2: 19 mmol/L — ABNORMAL LOW (ref 22–32)
Calcium: 8 mg/dL — ABNORMAL LOW (ref 8.9–10.3)
Chloride: 108 mmol/L (ref 101–111)
Creatinine, Ser: 13.11 mg/dL — ABNORMAL HIGH (ref 0.61–1.24)
GFR, EST AFRICAN AMERICAN: 4 mL/min — AB (ref >60)
GFR, EST NON AFRICAN AMERICAN: 3 mL/min — AB (ref >60)
Glucose, Bld: 90 mg/dL (ref 65–99)
POTASSIUM: 3.6 mmol/L (ref 3.5–5.1)
SODIUM: 141 mmol/L (ref 135–145)

## 2017-06-17 LAB — BPAM RBC
BLOOD PRODUCT EXPIRATION DATE: 201905232359
BLOOD PRODUCT EXPIRATION DATE: 201905232359
Blood Product Expiration Date: 201905302359
ISSUE DATE / TIME: 201904242327
ISSUE DATE / TIME: 201904251000
UNIT TYPE AND RH: 7300
Unit Type and Rh: 7300
Unit Type and Rh: 7300

## 2017-06-17 NOTE — Progress Notes (Signed)
Assessment/Plan  1. NonoliguricAKI on CKD 3in pt with AKI,? Hemodynamic with low BP on ACE-I---follow for recovery 2. Anemia and thrombocytopenia,s/p PRBCsnow with GI bleed 3. EtOH abuse:recentEtOH withdrawal 4. Elevated LFTs  5.  HTN, off med  Subjective: Interval History: "I feel good"  Objective: Vital signs in last 24 hours: Temp:  [97.7 F (36.5 C)-98.2 F (36.8 C)] 98.2 F (36.8 C) (04/28 0611) Pulse Rate:  [65-73] 73 (04/28 0611) Resp:  [22-24] 22 (04/28 0611) BP: (122-141)/(69-89) 128/89 (04/28 0611) SpO2:  [100 %] 100 % (04/28 1696) Weight change:   Intake/Output from previous day: 04/27 0701 - 04/28 0700 In: 840 [P.O.:840] Out: 1470 [Urine:1470] Intake/Output this shift: Total I/O In: 240 [P.O.:240] Out: 700 [Urine:700]  General appearance: alert and cooperative Back: symmetric, no curvature. ROM normal. No CVA tenderness. Resp: clear to auscultation bilaterally Cardio: regular rate and rhythm, S1, S2 normal, no murmur, click, rub or gallop Extremities: extremities normal, atraumatic, no cyanosis or edema  Lab Results: Recent Labs    06/14/17 1544  HGB 9.7*  HCT 28.2*   BMET:  Recent Labs    06/16/17 0340 06/17/17 0359  NA 139 141  K 3.8 3.6  CL 111 108  CO2 14* 19*  GLUCOSE 92 90  BUN 89* 87*  CREATININE 12.41* 13.11*  CALCIUM 8.0* 8.0*   No results for input(s): PTH in the last 72 hours. Iron Studies: No results for input(s): IRON, TIBC, TRANSFERRIN, FERRITIN in the last 72 hours. Studies/Results: No results found.  Scheduled: . famotidine  10 mg Oral Daily  . folic acid  1 mg Oral Daily  . multivitamin with minerals  1 tablet Oral Daily  . sodium citrate-citric acid  30 mL Oral TID PC & HS  . thiamine  100 mg Oral Daily   Or  . thiamine  100 mg Intravenous Daily     LOS: 4 days   [name] 06/17/2017,1:53 PM

## 2017-06-17 NOTE — Progress Notes (Signed)
PROGRESS NOTE    Charles [name]  QPY:[id number] DOB: July 27, 1950 DOA: 06/13/2017 PCP: Biagio Borg, MD    Brief Narrative:  67 year old male who presented with worsening kidney function. He does have thesignificant past medical history of COPD, BPH, cerebellar CVA, history of DVT/PE status post IVC filter, hypertension, dyslipidemia and history of spontaneous pneumothorax. For the last 5 days prior to hospitalization, patient experienced withdrawal symptoms consistent with nausea, decreased p.o. intake and difficulty ambulating.He was seen by his primary care physician who found him in acute renal failure, hewas referred to the hospital for further evaluation. On the initial physical examination blood pressure 96/60, heart rate 90, respiratory 15, temperature 98.3, oxygen saturation 100%. Dry mucous membranes, lungs clear to auscultation bilaterally, heart S1-S2 present rhythmic, the abdomen soft nontender, no lower extremity edema. Sodium 133, potassium 3.9, chloride 106, bicarb 18, glucose 105, BUN 93, creatinine 11.16, white count 9.6, hemoglobin 7.3,hematocrit 20.4, platelets 62.Urinalysis pH 5.0, protein 100, specific gravity 1.009, RBCs greater than 50, white cells 21-50,urine sodium 33,fecal occult blood positive.CT of the abdomen negative for hydronephrosis or ureteral stone.Chest x-ray with hyperinflation right base scaring (chronic).EKG normal sinus rhythm, normal axis normal intervals.  Patient was admitted to the hospitalwith theworking diagnosis of worsening renal failure,acute kidney injury, complicated of symptomatic anemia, to rule out acute blood loss, due to upper GI bleed.   Assessment & Plan:   Active Problems:   Hyperlipidemia   Unspecified Peripheral Vascular Disease   GERD   Abnormal LFTs   BPH (benign prostatic hyperplasia)   Anemia   Gross hematuria   Acute renal failure superimposed on stage 3 chronic kidney disease (HCC)   Dehydration  Symptomatic anemia   COPD not affecting current episode of care ([place])   Hx of pulmonary embolus   Thrombocytopenia (HCC)   Alcohol abuse   Acute renal failure (ARF) (HCC)   AKI (acute kidney injury) ([place])   Melena  1.Acute non oliguric kidney injurydue to ATN, with non anion gap metabolic acidosis.Urine output 1470 ml over last 24 hours, continue to remain with no signs of volume overload or uremia. Off IV fluids, serum bicarbonate up to 19 from 14, serum K at 3,6. Worsening cr at 13,1, with stable BUN at 87. Will continue to avoid hypotension and nephrotoxic medications, will follow renal panel in am.   2.Alcohol abuse.Will discontinue benzodiazepines, no anxiety or tremors, tolerating po well, ambulating.  3.Hypotension.Blood pressure continue to be well controlled.  Holding antihypertensive agents.  4.Symptomatic anemia,possible acute blood loss due to upper GI bleed, in the setting of anemia of chronic disease.Sp one unit prbc transfusion. Antiacid therapy with H2 blocker, tolerating po well, no nausea or vomiting, no signs of bleeding.  5. Hx of DVT and PE sp IVC filter. Will continue to hold anticoagulation for now, patient at home on rivaroxaban.   6. Liver shock with elevated liver enzymes. No clinical signs of liver failure, check liver panel in am.  DVT prophylaxis:scd Code Status:full Family Communication:no family at the bedside Disposition Plan:Home when recovery of renal function.   Consultants:  Nephrology   GI  Procedures:    Antimicrobials   Subjective: Patient is feeling better, able to ambulate, no nausea or vomiting, no chest pain, or dyspnea, no nausea or vomiting, tolerating po well.   Objective: Vitals:   06/16/17 0548 06/16/17 1403 06/16/17 2200 06/17/17 0611  BP: (!) 128/93 122/69 (!) 141/70 128/89  Pulse: 69 70 65 73  Resp: 14  (!) 24 (!)  22  Temp: 98.8 F (37.1 C) 97.7 F (36.5 C) 98 F (36.7 C) 98.2  F (36.8 C)  TempSrc: Oral Oral Oral Oral  SpO2: 99% 100% 100% 100%  Weight:      Height:        Intake/Output Summary (Last 24 hours) at 06/17/2017 0930 Last data filed at 06/17/2017 0347 Gross per 24 hour  Intake 600 ml  Output 1320 ml  Net -720 ml   Filed Weights   06/14/17 0825 06/15/17 0423  Weight: 75 kg (165 lb 5.5 oz) 77.4 kg (170 lb 10.2 oz)    Examination:   General: Not in pain or dyspnea,  Neurology: Awake and alert, non focal  E ENT: mild pallor, no icterus, oral mucosa moist Cardiovascular: No JVD. S1-S2 present, rhythmic, no gallops, rubs, or murmurs. No lower extremity edema. Pulmonary: vesicular breath sounds bilaterally, adequate air movement, no wheezing, rhonchi or rales. Gastrointestinal. Abdomen flat, no organomegaly, non tender, no rebound or guarding Skin. No rashes Musculoskeletal: no joint deformities     Data Reviewed: I have personally reviewed following labs and imaging studies  CBC: Recent Labs  Lab 06/13/17 1047 06/13/17 1736 06/13/17 2142 06/13/17 2345 06/14/17 0558 06/14/17 1544  WBC 8.5 9.6  --  9.0 9.1  --   NEUTROABS 5.8  --  6.9  --   --   --   HGB 8.2 Repeated and verified X2.* 7.3*  --  7.0* 7.5* 9.7*  HCT 23.5 Repeated and verified X2.* 20.4*  --  20.5* 21.4* 28.2*  MCV 101.1* 97.1  --  96.7 94.3  --   PLT 62.0* 62*  --  60* 53*  --    Basic Metabolic Panel: Recent Labs  Lab 06/13/17 2345 06/14/17 0558 06/15/17 0701 06/16/17 0340 06/17/17 0359  NA 134* 135 137 139 141  K 3.5 3.4* 3.6 3.8 3.6  CL 106 107 108 111 108  CO2 15* 16* 16* 14* 19*  GLUCOSE 101* 84 96 92 90  BUN 95* 91* 89* 89* 87*  CREATININE 11.34* 11.43* 12.12* 12.41* 13.11*  CALCIUM 8.2* 8.1* 8.2* 8.0* 8.0*  MG  --  1.9  --   --   --   PHOS  --  3.9 4.6  --   --    GFR: Estimated Creatinine Clearance: 6.2 mL/min (A) (by C-G formula based on SCr of 13.11 mg/dL (H)). Liver Function Tests: Recent Labs  Lab 06/13/17 1047 06/13/17 1921  06/13/17 2142 06/14/17 0558 06/15/17 0701  AST 335* 259*  --  163*  --   ALT 474* 450*  --  329*  --   ALKPHOS 94 84  --  70  --   BILITOT 2.0* 1.9* 1.6* 1.5*  --   PROT 6.0 7.1  --  6.0*  --   ALBUMIN 3.0* 3.0*  --  2.7* 2.7*   No results for input(s): LIPASE, AMYLASE in the last 168 hours. No results for input(s): AMMONIA in the last 168 hours. Coagulation Profile: Recent Labs  Lab 06/13/17 2003  INR 1.40   Cardiac Enzymes: Recent Labs  Lab 06/13/17 2003 06/13/17 2243 06/14/17 0558  CKTOTAL 119  --   --   TROPONINI  --  <0.03 <0.03   BNP (last 3 results) No results for input(s): PROBNP in the last 8760 hours. HbA1C: No results for input(s): HGBA1C in the last 72 hours. CBG: No results for input(s): GLUCAP in the last 168 hours. Lipid Profile: No results for input(s): CHOL,  HDL, LDLCALC, TRIG, CHOLHDL, LDLDIRECT in the last 72 hours. Thyroid Function Tests: No results for input(s): TSH, T4TOTAL, FREET4, T3FREE, THYROIDAB in the last 72 hours. Anemia Panel: No results for input(s): VITAMINB12, FOLATE, FERRITIN, TIBC, IRON, RETICCTPCT in the last 72 hours.    Radiology Studies: I have reviewed all of the imaging during this hospital visit personally     Scheduled Meds: . famotidine  10 mg Oral Daily  . folic acid  1 mg Oral Daily  . multivitamin with minerals  1 tablet Oral Daily  . sodium citrate-citric acid  30 mL Oral TID PC & HS  . thiamine  100 mg Oral Daily   Or  . thiamine  100 mg Intravenous Daily   Continuous Infusions: . sodium chloride       LOS: 4 days        Jarel Cuadra Gerome Apley, MD [facility] Pager [phone]

## 2017-06-18 ENCOUNTER — Encounter (HOSPITAL_COMMUNITY): Payer: Self-pay | Admitting: Internal Medicine

## 2017-06-18 LAB — BASIC METABOLIC PANEL
Anion gap: 12 (ref 5–15)
BUN: 91 mg/dL — ABNORMAL HIGH (ref 6–20)
CHLORIDE: 110 mmol/L (ref 101–111)
CO2: 21 mmol/L — AB (ref 22–32)
CREATININE: 13.15 mg/dL — AB (ref 0.61–1.24)
Calcium: 7.8 mg/dL — ABNORMAL LOW (ref 8.9–10.3)
GFR calc non Af Amer: 3 mL/min — ABNORMAL LOW (ref >60)
GFR, EST AFRICAN AMERICAN: 4 mL/min — AB (ref >60)
Glucose, Bld: 89 mg/dL (ref 65–99)
Potassium: 3.7 mmol/L (ref 3.5–5.1)
Sodium: 143 mmol/L (ref 135–145)

## 2017-06-18 LAB — URINALYSIS, ROUTINE W REFLEX MICROSCOPIC
BACTERIA UA: NONE SEEN
Bilirubin Urine: NEGATIVE
Glucose, UA: NEGATIVE mg/dL
Ketones, ur: NEGATIVE mg/dL
Leukocytes, UA: NEGATIVE
Nitrite: NEGATIVE
PH: 8 (ref 5.0–8.0)
Protein, ur: NEGATIVE mg/dL
SPECIFIC GRAVITY, URINE: 1.006 (ref 1.005–1.030)

## 2017-06-18 LAB — HEPATIC FUNCTION PANEL
ALBUMIN: 2.7 g/dL — AB (ref 3.5–5.0)
ALK PHOS: 54 U/L (ref 38–126)
ALT: 101 U/L — ABNORMAL HIGH (ref 17–63)
AST: 32 U/L (ref 15–41)
BILIRUBIN TOTAL: 1.1 mg/dL (ref 0.3–1.2)
Bilirubin, Direct: 0.3 mg/dL (ref 0.1–0.5)
Indirect Bilirubin: 0.8 mg/dL (ref 0.3–0.9)
Total Protein: 6.4 g/dL — ABNORMAL LOW (ref 6.5–8.1)

## 2017-06-18 MED ORDER — SODIUM CHLORIDE 0.45 % IV SOLN
INTRAVENOUS | Status: DC
  Administered 2017-06-18: 18:00:00 via INTRAVENOUS

## 2017-06-18 NOTE — Progress Notes (Addendum)
[facility] Progress Note  Subjective: 3.1 L UOP yest, creat stable at 13.1  Vitals:   06/17/17 1359 06/17/17 1954 06/18/17 0521 06/18/17 1244  BP: 130/71 132/88 109/75 101/69  Pulse: 69 70 81 85  Resp:  20 20   Temp: (!) 97.4 F (36.3 C) 99.1 F (37.3 C) 98.5 F (36.9 C) 98.6 F (37 C)  TempSrc: Oral Oral Oral Oral  SpO2: 100% 100% 100% 100%  Weight:      Height:        Inpatient medications: . famotidine  10 mg Oral Daily  . folic acid  1 mg Oral Daily  . multivitamin with minerals  1 tablet Oral Daily  . sodium citrate-citric acid  30 mL Oral TID PC & HS  . thiamine  100 mg Oral Daily   Or  . thiamine  100 mg Intravenous Daily   . sodium chloride     acetaminophen **OR** acetaminophen, HYDROcodone-acetaminophen, ondansetron **OR** ondansetron (ZOFRAN) IV  Exam: Alert, no distress No jvd Chest cta bilat Cor reg no mrg Abd soft ntnd Ext no edema  UA 4/24 > prot 100 , >50 rbc/ hpf, 21-50 wbc/ hpf, 0-5 epi Renal US 4/24 bilat echogenic kidneys, 11cm, normal size no hydro  urine sod 33  Urine creat 94   date  Creat   egfr  2008- 17 1.0- 1.3  2018   1.15 - 2.33 >60  aug 2018 1.80     apr 24  10.59  apr 26  12.12  apr 29 today 13.15  Home meds  - xarelto/ cialis/ crestor/ zetia - norvasc 10/ prinivil qd   Impression: 1 acute kidney injury - creat appears leveling off, uop excellent urine lytes suggest atn no signs of obstruction, may benefit from more volume will restart ivf's and repeat ua 2 ckd II w/ baseline creat 1.1- 1.4 3 etoh abuse 4 anemia/ low plts w gi bleed - sp prbcs 5 ^lfts 6 hypertension holding meds 7 copd  8 hx pulm embolus 2018 9 hx cva 2016  Plan - have d/w patient and answered questions, will follow   Kelly Splinter MD [facility] pager [phone]   06/18/2017, 4:08 PM   Recent Labs  Lab 06/14/17 0558 06/15/17 0701 06/16/17 0340 06/17/17 0359 06/18/17 0410  NA 135 137 139 141 143  K 3.4*  3.6 3.8 3.6 3.7  CL 107 108 111 108 110  CO2 16* 16* 14* 19* 21*  GLUCOSE 84 96 92 90 89  BUN 91* 89* 89* 87* 91*  CREATININE 11.43* 12.12* 12.41* 13.11* 13.15*  CALCIUM 8.1* 8.2* 8.0* 8.0* 7.8*  PHOS 3.9 4.6  --   --   --    Recent Labs  Lab 06/13/17 1921 06/13/17 2142 06/14/17 0558 06/15/17 0701 06/18/17 0410  AST 259*  --  163*  --  32  ALT 450*  --  329*  --  101*  ALKPHOS 84  --  70  --  54  BILITOT 1.9* 1.6* 1.5*  --  1.1  PROT 7.1  --  6.0*  --  6.4*  ALBUMIN 3.0*  --  2.7* 2.7* 2.7*   Recent Labs  Lab 06/13/17 1047 06/13/17 1736 06/13/17 2142 06/13/17 2345 06/14/17 0558 06/14/17 1544  WBC 8.5 9.6  --  9.0 9.1  --   NEUTROABS 5.8  --  6.9  --   --   --   HGB 8.2 Repeated and verified X2.* 7.3*  --  7.0*  7.5* 9.7*  HCT 23.5 Repeated and verified X2.* 20.4*  --  20.5* 21.4* 28.2*  MCV 101.1* 97.1  --  96.7 94.3  --   PLT 62.0* 62*  --  60* 53*  --    Iron/TIBC/Ferritin/ %Sat    Component Value Date/Time   IRON 59 06/13/2017 1921   TIBC 266 06/13/2017 1921   FERRITIN 2,835 (H) 06/13/2017 1921   IRONPCTSAT 22 06/13/2017 1921

## 2017-06-18 NOTE — Progress Notes (Signed)
Physical Therapy Discharge Patient Details Name: Charles [name] MRN: [id number] DOB: Oct 06, 1950 Today's Date: 06/18/2017 Time:  -     Patient discharged from PT services secondary to goals met and no further PT needs identified.  Please see latest therapy progress note for current level of functioning and progress toward goals.    Progress and discharge plan discussed with patient and/or caregiver: Patient/Caregiver agrees with plan   Pt reports he has been up walking around without difficulty and showered this morning.  He declines any further PT needs at this time.  PT to sign off per pt request.  GP     Enna Warwick,KATHrine E 06/18/2017, 10:59 AM  Carmelia Bake, PT, DPT 06/18/2017 Pager: [phone]

## 2017-06-18 NOTE — Progress Notes (Signed)
PROGRESS NOTE    Charles [name]  BOF:[id number] DOB: 1950-09-09 DOA: 06/13/2017 PCP: Biagio Borg, MD    Brief Narrative:  67 year old male who presented with worsening kidney function. He does have thesignificant past medical history of COPD, BPH, cerebellar CVA, history of DVT/PE status post IVC filter, hypertension, dyslipidemia and history of spontaneous pneumothorax. For the last 5 days prior to hospitalization, patient experienced withdrawal symptoms consistent with nausea, decreased p.o. intake and difficulty ambulating.He was seen by his primary care physician who found him in acute renal failure, hewas referred to the hospital for further evaluation. On the initial physical examination blood pressure 96/60, heart rate 90, respiratory 15, temperature 98.3, oxygen saturation 100%. Dry mucous membranes, lungs clear to auscultation bilaterally, heart S1-S2 present rhythmic, the abdomen soft nontender, no lower extremity edema. Sodium 133, potassium 3.9, chloride 106, bicarb 18, glucose 105, BUN 93, creatinine 11.16, white count 9.6, hemoglobin 7.3,hematocrit 20.4, platelets 62.Urinalysis pH 5.0, protein 100, specific gravity 1.009, RBCs greater than 50, white cells 21-50,urine sodium 33,fecal occult blood positive.CT of the abdomen negative for hydronephrosis or ureteral stone.Chest x-ray with hyperinflation right base scaring (chronic).EKG normal sinus rhythm, normal axis normal intervals.  Patient was admitted to the hospitalwith theworking diagnosis of worsening renal failure,acute kidney injury, complicated of symptomatic anemia, to rule out acute blood loss, due to upper GI bleed.  Assessment & Plan:   Active Problems:   Hyperlipidemia   Unspecified Peripheral Vascular Disease   GERD   Abnormal LFTs   BPH (benign prostatic hyperplasia)   Anemia   Gross hematuria   Acute renal failure superimposed on stage 3 chronic kidney disease (HCC)   Dehydration  Symptomatic anemia   COPD not affecting current episode of care ([place])   Hx of pulmonary embolus   Thrombocytopenia (HCC)   Alcohol abuse   Acute renal failure (ARF) (HCC)   AKI (acute kidney injury) ([place])   Melena  1.Acute non oligurickidney injurydue to ATN, with non anion gap metabolic acidosis.Urine output 3,150 mlover last 24 hours, no signs of volume overload or uremia. Patient tolerating po well, serum bicarbonate up to 21, will dc bicitra. Cr has plateau at 13, K at 3,7, will expect improvement on serum cr in the next 24 hours. Continue to avoid nephrotoxic medications.  2.Alcohol abuse.Patient back to his baseline, no nausea or vomiting, no tremors, or anxiety.  3.Hypotension.systolic blood pressure 778 to 130 mmHg, will continue to hold on antihypertensive agents.   4.Symptomatic anemia,possible acute blood loss due to upper GI bleed, in the setting of anemia of chronic disease.Sp one unit prbc transfusion.Continue H2 blocker.   5. Hx of DVT and PE sp IVC filter. Rivaroxaban, on hold due to low GFR, to resume when recovered renal function, calculated GFR greater than 30.   6.Liver shock with elevated liver enzymes. AST 32, ALT 101. Resolving liver injury.   DVT prophylaxis:scd Code Status:full Family Communication:no family at the bedside Disposition Plan:Home when recovery of renal function, possible within the next 24 hours.    Consultants:  Nephrology   GI  Procedures:    Antimicrobials   Subjective: Patient is feeling well, has been out of bed, tolerating po well, no nausea or vomiting.   Objective: Vitals:   06/17/17 0611 06/17/17 1359 06/17/17 1954 06/18/17 0521  BP: 128/89 130/71 132/88 109/75  Pulse: 73 69 70 81  Resp: (!) 22  20 20   Temp: 98.2 F (36.8 C) (!) 97.4 F (36.3 C) 99.1 F (37.3 C)  98.5 F (36.9 C)  TempSrc: Oral Oral Oral Oral  SpO2: 100% 100% 100% 100%  Weight:      Height:         Intake/Output Summary (Last 24 hours) at 06/18/2017 1225 Last data filed at 06/18/2017 0550 Gross per 24 hour  Intake 720 ml  Output 2450 ml  Net -1730 ml   Filed Weights   06/14/17 0825 06/15/17 0423  Weight: 75 kg (165 lb 5.5 oz) 77.4 kg (170 lb 10.2 oz)    Examination:   General: Not in pain or dyspnea Neurology: Awake and alert, non focal  E ENT: no pallor, no icterus, oral mucosa moist Cardiovascular: No JVD. S1-S2 present, rhythmic, no gallops, rubs, or murmurs. No lower extremity edema. Pulmonary: vesicular breath sounds bilaterally, adequate air movement, no wheezing, rhonchi or rales. Gastrointestinal. Abdomen with no organomegaly, non tender, no rebound or guarding Skin. No rashes Musculoskeletal: no joint deformities     Data Reviewed: I have personally reviewed following labs and imaging studies  CBC: Recent Labs  Lab 06/13/17 1047 06/13/17 1736 06/13/17 2142 06/13/17 2345 06/14/17 0558 06/14/17 1544  WBC 8.5 9.6  --  9.0 9.1  --   NEUTROABS 5.8  --  6.9  --   --   --   HGB 8.2 Repeated and verified X2.* 7.3*  --  7.0* 7.5* 9.7*  HCT 23.5 Repeated and verified X2.* 20.4*  --  20.5* 21.4* 28.2*  MCV 101.1* 97.1  --  96.7 94.3  --   PLT 62.0* 62*  --  60* 53*  --    Basic Metabolic Panel: Recent Labs  Lab 06/14/17 0558 06/15/17 0701 06/16/17 0340 06/17/17 0359 06/18/17 0410  NA 135 137 139 141 143  K 3.4* 3.6 3.8 3.6 3.7  CL 107 108 111 108 110  CO2 16* 16* 14* 19* 21*  GLUCOSE 84 96 92 90 89  BUN 91* 89* 89* 87* 91*  CREATININE 11.43* 12.12* 12.41* 13.11* 13.15*  CALCIUM 8.1* 8.2* 8.0* 8.0* 7.8*  MG 1.9  --   --   --   --   PHOS 3.9 4.6  --   --   --    GFR: Estimated Creatinine Clearance: 6.2 mL/min (A) (by C-G formula based on SCr of 13.15 mg/dL (H)). Liver Function Tests: Recent Labs  Lab 06/13/17 1047 06/13/17 1921 06/13/17 2142 06/14/17 0558 06/15/17 0701 06/18/17 0410  AST 335* 259*  --  163*  --  32  ALT 474* 450*  --   329*  --  101*  ALKPHOS 94 84  --  70  --  54  BILITOT 2.0* 1.9* 1.6* 1.5*  --  1.1  PROT 6.0 7.1  --  6.0*  --  6.4*  ALBUMIN 3.0* 3.0*  --  2.7* 2.7* 2.7*   No results for input(s): LIPASE, AMYLASE in the last 168 hours. No results for input(s): AMMONIA in the last 168 hours. Coagulation Profile: Recent Labs  Lab 06/13/17 2003  INR 1.40   Cardiac Enzymes: Recent Labs  Lab 06/13/17 2003 06/13/17 2243 06/14/17 0558  CKTOTAL 119  --   --   TROPONINI  --  <0.03 <0.03   BNP (last 3 results) No results for input(s): PROBNP in the last 8760 hours. HbA1C: No results for input(s): HGBA1C in the last 72 hours. CBG: No results for input(s): GLUCAP in the last 168 hours. Lipid Profile: No results for input(s): CHOL, HDL, LDLCALC, TRIG, CHOLHDL, LDLDIRECT in the last  72 hours. Thyroid Function Tests: No results for input(s): TSH, T4TOTAL, FREET4, T3FREE, THYROIDAB in the last 72 hours. Anemia Panel: No results for input(s): VITAMINB12, FOLATE, FERRITIN, TIBC, IRON, RETICCTPCT in the last 72 hours.    Radiology Studies: I have reviewed all of the imaging during this hospital visit personally     Scheduled Meds: . famotidine  10 mg Oral Daily  . folic acid  1 mg Oral Daily  . multivitamin with minerals  1 tablet Oral Daily  . sodium citrate-citric acid  30 mL Oral TID PC & HS  . thiamine  100 mg Oral Daily   Or  . thiamine  100 mg Intravenous Daily   Continuous Infusions: . sodium chloride       LOS: 5 days        Najae Filsaime Gerome Apley, MD [facility] Pager [phone]

## 2017-06-18 NOTE — Care Management Note (Signed)
Case Management Note  Patient Details  Name: Charles [name] MRN: [id number] Date of Birth: 1950/12/21  Subjective/Objective:    67 yo admitted with Abnormal LFTs.                Action/Plan: From home with wife. PT recommendations gone over with pt at bedside. Pt declines both HHPT and RW. He states he has been walking around all weekend and his balance has improved.  Expected Discharge Date:  (unknown)               Expected Discharge Plan:     In-House Referral:     Discharge planning Services     Post Acute Care Choice:    Choice offered to:     DME Arranged:    DME Agency:     HH Arranged:    HH Agency:     Status of Service:     If discussed at [employer], dates discussed:    Additional CommentsLynnell Catalan, RN 06/18/2017, 10:08 AM  [phone]

## 2017-06-19 LAB — DIFFERENTIAL
BASOS PCT: 0 %
Band Neutrophils: 0 %
Blasts: 0 %
Eosinophils Relative: 0 %
LYMPHS PCT: 20 %
Lymphs Abs: 1.9 10*3/uL (ref 0.7–4.0)
METAMYELOCYTES PCT: 0 %
MYELOCYTES: 0 %
Monocytes Absolute: 0.8 10*3/uL (ref 0.1–1.0)
Monocytes Relative: 8 %
NEUTROS PCT: 72 %
NRBC: 0 /100{WBCs}
Neutro Abs: 6.9 10*3/uL (ref 1.7–7.7)
Other: 0 %
Promyelocytes Relative: 0 %

## 2017-06-19 LAB — PROTIME-INR
INR: 1.01
PROTHROMBIN TIME: 13.2 s (ref 11.4–15.2)

## 2017-06-19 LAB — APTT: APTT: 29 s (ref 24–36)

## 2017-06-19 LAB — BASIC METABOLIC PANEL
Anion gap: 14 (ref 5–15)
BUN: 83 mg/dL — ABNORMAL HIGH (ref 6–20)
CHLORIDE: 107 mmol/L (ref 101–111)
CO2: 21 mmol/L — ABNORMAL LOW (ref 22–32)
CREATININE: 12.43 mg/dL — AB (ref 0.61–1.24)
Calcium: 7.6 mg/dL — ABNORMAL LOW (ref 8.9–10.3)
GFR calc non Af Amer: 4 mL/min — ABNORMAL LOW (ref >60)
GFR, EST AFRICAN AMERICAN: 4 mL/min — AB (ref >60)
Glucose, Bld: 79 mg/dL (ref 65–99)
POTASSIUM: 3.4 mmol/L — AB (ref 3.5–5.1)
SODIUM: 142 mmol/L (ref 135–145)

## 2017-06-19 LAB — HEPARIN LEVEL (UNFRACTIONATED): Heparin Unfractionated: 0.51 IU/mL (ref 0.30–0.70)

## 2017-06-19 MED ORDER — HEPARIN (PORCINE) IN NACL 100-0.45 UNIT/ML-% IJ SOLN
1250.0000 [IU]/h | INTRAMUSCULAR | Status: DC
  Administered 2017-06-19 – 2017-06-24 (×7): 1250 [IU]/h via INTRAVENOUS
  Filled 2017-06-19 (×8): qty 250

## 2017-06-19 MED ORDER — COUMADIN BOOK
Freq: Once | Status: AC
  Administered 2017-06-19: 12:00:00
  Filled 2017-06-19: qty 1

## 2017-06-19 MED ORDER — WARFARIN - PHARMACIST DOSING INPATIENT
Freq: Every day | Status: DC
  Administered 2017-06-21 – 2017-06-23 (×3)

## 2017-06-19 MED ORDER — WARFARIN SODIUM 5 MG PO TABS
7.5000 mg | ORAL_TABLET | Freq: Once | ORAL | Status: AC
  Administered 2017-06-19: 7.5 mg via ORAL
  Filled 2017-06-19: qty 1

## 2017-06-19 MED ORDER — HEPARIN BOLUS VIA INFUSION
2000.0000 [IU] | Freq: Once | INTRAVENOUS | Status: AC
  Administered 2017-06-19: 2000 [IU] via INTRAVENOUS
  Filled 2017-06-19: qty 2000

## 2017-06-19 MED ORDER — WARFARIN VIDEO: Freq: Once | Status: DC

## 2017-06-19 NOTE — Progress Notes (Signed)
[facility] Progress Note  Subjective: creat down 12.4 no uremic symptoms voiding lots   Vitals:   06/18/17 1244 06/18/17 2036 06/19/17 0530 06/19/17 1303  BP: 101/69 116/72 112/86 135/71  Pulse: 85 77 88 60  Resp:  20 18 16   Temp: 98.6 F (37 C) 98.6 F (37 C) 98.4 F (36.9 C) 97.6 F (36.4 C)  TempSrc: Oral Oral Oral Oral  SpO2: 100% 100% 100% 98%  Weight:      Height:        Inpatient medications: . famotidine  10 mg Oral Daily  . multivitamin with minerals  1 tablet Oral Daily  . thiamine  100 mg Oral Daily   Or  . thiamine  100 mg Intravenous Daily  . warfarin  7.5 mg Oral ONCE-1800  . warfarin   Does not apply Once  . Warfarin - Pharmacist Dosing Inpatient   Does not apply q1800   . sodium chloride 65 mL/hr at 06/18/17 1751  . heparin 1,250 Units/hr (06/19/17 1343)   acetaminophen **OR** acetaminophen, HYDROcodone-acetaminophen  Exam: Alert, no distress No jvd Chest cta bilat Cor reg no mrg Abd soft ntnd Ext no edema  UA 4/24 > prot 100 , >50 rbc/ hpf, 21-50 wbc/ hpf, 0-5 epi Renal US 4/24 bilat echogenic kidneys, 11cm, normal size no hydro  urine sod 33  Urine creat 94   date  Creat   egfr  2008- 17 1.0- 1.3  2018   1.15 - 2.33 >60  aug 2018 1.80     apr 24  10.59  apr 26  12.12  apr 29 today 13.15  Home meds  - xarelto/ cialis/ crestor/ zetia - norvasc 10/ prinivil qd   Impression: 1 acute kidney injury - uop excellent urine lytes suggest atn (due to hypovolemia/ hypotension/ acei,  no signs of obstruction will continue supportive care appears to be starting to recover repeat ua is normal have d/w patient and answered all questions 2 ckd II w/ baseline creat 1.1- 1.4 3 etoh abuse 4 anemia/ low plts w gi bleed - sp prbcs 5 ^lfts 6 hypertension holding meds 7 copd  8 hx pulm embolus 2018 9 hx cva 2016  Plan - as above   Kelly Splinter MD [facility] pager [phone]   06/19/2017, 4:15 PM   Recent Labs   Lab 06/14/17 0558 06/15/17 0701  06/17/17 0359 06/18/17 0410 06/19/17 0409  NA 135 137   < > 141 143 142  K 3.4* 3.6   < > 3.6 3.7 3.4*  CL 107 108   < > 108 110 107  CO2 16* 16*   < > 19* 21* 21*  GLUCOSE 84 96   < > 90 89 79  BUN 91* 89*   < > 87* 91* 83*  CREATININE 11.43* 12.12*   < > 13.11* 13.15* 12.43*  CALCIUM 8.1* 8.2*   < > 8.0* 7.8* 7.6*  PHOS 3.9 4.6  --   --   --   --    < > = values in this interval not displayed.   Recent Labs  Lab 06/13/17 1921 06/13/17 2142 06/14/17 0558 06/15/17 0701 06/18/17 0410  AST 259*  --  163*  --  32  ALT 450*  --  329*  --  101*  ALKPHOS 84  --  70  --  54  BILITOT 1.9* 1.6* 1.5*  --  1.1  PROT 7.1  --  6.0*  --  6.4*  ALBUMIN 3.0*  --  2.7* 2.7* 2.7*   Recent Labs  Lab 06/13/17 1047 06/13/17 1736 06/13/17 2142 06/13/17 2345 06/14/17 0558 06/14/17 1544  WBC 8.5 9.6  --  9.0 9.1  --   NEUTROABS 5.8  --  6.9  --   --   --   HGB 8.2 Repeated and verified X2.* 7.3*  --  7.0* 7.5* 9.7*  HCT 23.5 Repeated and verified X2.* 20.4*  --  20.5* 21.4* 28.2*  MCV 101.1* 97.1  --  96.7 94.3  --   PLT 62.0* 62*  --  60* 53*  --    Iron/TIBC/Ferritin/ %Sat    Component Value Date/Time   IRON 59 06/13/2017 1921   TIBC 266 06/13/2017 1921   FERRITIN 2,835 (H) 06/13/2017 1921   IRONPCTSAT 22 06/13/2017 1921

## 2017-06-19 NOTE — Progress Notes (Signed)
[facility] for Heparin/Warfarin Indication: pulmonary embolus, tx with Xarelto PTA  No Known Allergies  Patient Measurements: Height: 6\' 4"  (193 cm) Weight: 170 lb 10.2 oz (77.4 kg) IBW/kg (Calculated) : 86.8 Heparin Dosing Weight: 77.4kg  Vital Signs: Temp: 98.4 F (36.9 C) (04/30 0530) Temp Source: Oral (04/30 0530) BP: 112/86 (04/30 0530) Pulse Rate: 88 (04/30 0530)  Labs: Recent Labs    06/17/17 0359 06/18/17 0410 06/19/17 0409  CREATININE 13.11* 13.15* 12.43*   Estimated Creatinine Clearance: 6.6 mL/min (A) (by C-G formula based on SCr of 12.43 mg/dL (H)).  Medical History: Past Medical History:  Diagnosis Date  . ABNORMAL ELECTROCARDIOGRAM 11/02/2008  . ABSCESS, LUNG 10/23/2006  . Anemia 05/26/2014  . BACTERIAL PNEUMONIA 12/28/2009  . BPH (benign prostatic hyperplasia) 11/22/2010  . Cerebellar stroke ([place]) 05/26/2014  . CHEST PAIN 12/24/2009  . Coronary artery calcification seen on CT scan 06/01/2015  . Cramp of limb 07/19/2007  . DIVERTICULOSIS, COLON 03/07/2007  . DVT (deep venous thrombosis) ([place]) 01/2014   LLE  . Emphysema of lung ([place])   . 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/23/2006  . FLANK PAIN, LEFT 02/25/2010  . FREQUENCY, URINARY 12/24/2009  . HEMORRHOIDS 10/23/2006  . HYPERLIPIDEMIA 10/23/2006  . HYPERTENSION 10/23/2006  . PE (pulmonary embolism) 02/2014  . PLANTAR FASCIITIS, LEFT 11/02/2008  . Pneumonia 12/2013 X 2  . PSA, INCREASED 11/20/2007  . PULMONARY NODULE 05/14/2008  . Unspecified Peripheral Vascular Disease 10/23/2006   Medications:  Scheduled:  . famotidine  10 mg Oral Daily  . heparin  2,000 Units Intravenous Once  . multivitamin with minerals  1 tablet Oral Daily  . thiamine  100 mg Oral Daily   Or  . thiamine  100 mg Intravenous Daily  . warfarin  7.5 mg Oral ONCE-1800  . warfarin   Does not apply Once  . Warfarin - Pharmacist Dosing Inpatient   Does not apply q1800   Infusions:  . sodium chloride 65 mL/hr at 06/18/17  1751  . heparin      Assessment: 62 yoM with Non-oliguric AKI (baseline SCr 1.1-1.4), likely pre-renal/low BP on Lisinopril  - Pt reports dark vomitus 4 days ago, FOBT PMH: COPD, BPH, CVA, HLD, HTN, DVT/ PE on Xarelto, h/o acute PE in 2018 when stopped AC s/p hemopneumothorax, IVC filter (REMOVED), EtOH abuse, PVD, and CKD 3a  Home dose Xarelto 10mg  daily, last dose 4/24  Today, 06/19/2017 Begin Heparin and Warfarin, unable to use Xarelto with severe renal dysfunction Ordered baseline INR Ordered aPTT: anticipate Xarelto effect dissipated, but will ensure before starting Heparin  Goal of Therapy:  INR 2-3 Heparin level 0.3-0.7 units/ml Monitor platelets by anticoagulation protocol: Yes   Plan:  Heparin bolus 2000 units, infusion rate 1250 units/hr Warfarin 7.5mg  today at 1800 Daily PT/INR, CBC First Heparin level 8 hr after bolus/infusion begun  Minda Ditto PharmD Pager [phone] 06/19/2017, 12:21 PM

## 2017-06-19 NOTE — Progress Notes (Addendum)
PROGRESS NOTE    Charles [name]  LTJ:[id number] DOB: [birth year] DOA: 06/13/2017 PCP: Biagio Borg, MD    Brief Narrative:  67 year old male who presented with worsening kidney function. He does have thesignificant past medical history of COPD, BPH, cerebellar CVA, history of DVT/PE status post IVC filter, hypertension, dyslipidemia and history of spontaneous pneumothorax. For the last 5 days prior to hospitalization, patient experienced withdrawal symptoms consistent with nausea, decreased p.o. intake and difficulty ambulating.He was seen by his primary care physician who found him in acute renal failure, hewas referred to the hospital for further evaluation. On the initial physical examination blood pressure 96/60, heart rate 90, respiratory rate 15, temperature 98.3, oxygen saturation 100%. Dry mucous membranes, lungs clear to auscultation bilaterally, heart S1-S2 present rhythmic, the abdomen soft nontender, no lower extremity edema. Sodium 133, potassium 3.9, chloride 106, bicarb 18, glucose 105, BUN 93, creatinine 11.16, white count 9.6, hemoglobin 7.3,hematocrit 20.4, platelets 62.Urinalysis pH 5.0, protein 100, specific gravity 1.009, RBCs greater than 50, white cells 21-50,urine sodium 33,fecal occult blood positive.CT of the abdomen negative for hydronephrosis or ureteral stone.Chest x-ray with hyperinflation right base scaring (chronic).EKG normal sinus rhythm, normal axis normal intervals.  Patient was admitted to the hospitalwith theworking diagnosis of worsening renal failure,acute kidney injury, complicated of symptomatic anemia, to rule out acute blood loss, due to upper GI bleed.   Assessment & Plan:   Active Problems:   Hyperlipidemia   Unspecified Peripheral Vascular Disease   GERD   Abnormal LFTs   BPH (benign prostatic hyperplasia)   Anemia   Gross hematuria   Acute renal failure superimposed on stage 3 chronic kidney disease (HCC)   Dehydration  Symptomatic anemia   COPD not affecting current episode of care ([place])   Hx of pulmonary embolus   Thrombocytopenia (HCC)   Alcohol abuse   Acute renal failure (ARF) (HCC)   AKI (acute kidney injury) ([place])   Melena   1.Acute non oligurickidney injurydue to ATN, with non anion gap metabolic acidosis.Serum cr slowly trending down to 12,43 from 13,1 urine output documented over last 24 hours 650 ml. Clinically patient with no volume overload or uremia. Serum k at 3,4 and serum bicarbonate at 21.   2.Alcohol abuse.No tremors, or anxiety. No further withdrawal symptoms.   3.Hypotension.Continue to hold on ace inh and calcium channel blocker, at home on 20 mg of lisinopril and 10 mg of amlodipine. Blood pressure currently 112 to 076 mmHg systolic.   4.Symptomatic anemia,in the setting of anemia of chronic disease.Status post one unit prbc transfusion.(ruled out upper GI bleed). ContinueH2 blocker. EGD on 04/26 with no signs of recent upper GI bleed. Patient out of bed. Hb at 9,7 and Hct at 28.2 on 4/25.   5. Hx of DVT and PEspIVC filter/ recurrent DVT/ At home on Rivaroxaban, on hold due to low GFR. Old records personally reviewed, documented that patient will need life long anticoagulation. Very slow to recover renal function, GFR still significantly reduced. Will start patient on heparin and warfarin. Target INR 2 to 3.   6.Liver shock with elevated liver enzymes.Resolved liver injury. Avoid further hypotension.   7. Thrombocytopenia. Suspected to be reactive, follow cell count in am.    DVT prophylaxis:heparin and warfarin  Code Status:full Family Communication:no family at the bedside Disposition Plan:Needs therapeutic INR before discharge.   Consultants:  Nephrology   GI  Procedures:    Antimicrobials   Subjective: Patient feeling better, no nausea or vomiting, no chest pain  or dyspnea, has been out of bed and ambulating. No tremors or  anxiety.   Objective: Vitals:   06/18/17 0521 06/18/17 1244 06/18/17 2036 06/19/17 0530  BP: 109/75 101/69 116/72 112/86  Pulse: 81 85 77 88  Resp: 20  20 18   Temp: 98.5 F (36.9 C) 98.6 F (37 C) 98.6 F (37 C) 98.4 F (36.9 C)  TempSrc: Oral Oral Oral Oral  SpO2: 100% 100% 100% 100%  Weight:      Height:        Intake/Output Summary (Last 24 hours) at 06/19/2017 0822 Last data filed at 06/19/2017 0602 Gross per 24 hour  Intake 1271.92 ml  Output 650 ml  Net 621.92 ml   Filed Weights   06/14/17 0825 06/15/17 0423  Weight: 75 kg (165 lb 5.5 oz) 77.4 kg (170 lb 10.2 oz)    Examination:   General: Not in pain or dyspnea.  Neurology: Awake and alert, non focal  E ENT: mild pallor, no icterus, oral mucosa moist Cardiovascular: No JVD. S1-S2 present, rhythmic, no gallops, rubs, or murmurs. No lower extremity edema. Pulmonary: vesicular breath sounds bilaterally, adequate air movement, no wheezing, rhonchi or rales. Gastrointestinal. Abdomen with no organomegaly, non tender, no rebound or guarding Skin. No rashes Musculoskeletal: no joint deformities     Data Reviewed: I have personally reviewed following labs and imaging studies  CBC: Recent Labs  Lab 06/13/17 1047 06/13/17 1736 06/13/17 2142 06/13/17 2345 06/14/17 0558 06/14/17 1544  WBC 8.5 9.6  --  9.0 9.1  --   NEUTROABS 5.8  --  6.9  --   --   --   HGB 8.2 Repeated and verified X2.* 7.3*  --  7.0* 7.5* 9.7*  HCT 23.5 Repeated and verified X2.* 20.4*  --  20.5* 21.4* 28.2*  MCV 101.1* 97.1  --  96.7 94.3  --   PLT 62.0* 62*  --  60* 53*  --    Basic Metabolic Panel: Recent Labs  Lab 06/14/17 0558 06/15/17 0701 06/16/17 0340 06/17/17 0359 06/18/17 0410 06/19/17 0409  NA 135 137 139 141 143 142  K 3.4* 3.6 3.8 3.6 3.7 3.4*  CL 107 108 111 108 110 107  CO2 16* 16* 14* 19* 21* 21*  GLUCOSE 84 96 92 90 89 79  BUN 91* 89* 89* 87* 91* 83*  CREATININE 11.43* 12.12* 12.41* 13.11* 13.15* 12.43*    CALCIUM 8.1* 8.2* 8.0* 8.0* 7.8* 7.6*  MG 1.9  --   --   --   --   --   PHOS 3.9 4.6  --   --   --   --    GFR: Estimated Creatinine Clearance: 6.6 mL/min (A) (by C-G formula based on SCr of 12.43 mg/dL (H)). Liver Function Tests: Recent Labs  Lab 06/13/17 1047 06/13/17 1921 06/13/17 2142 06/14/17 0558 06/15/17 0701 06/18/17 0410  AST 335* 259*  --  163*  --  32  ALT 474* 450*  --  329*  --  101*  ALKPHOS 94 84  --  70  --  54  BILITOT 2.0* 1.9* 1.6* 1.5*  --  1.1  PROT 6.0 7.1  --  6.0*  --  6.4*  ALBUMIN 3.0* 3.0*  --  2.7* 2.7* 2.7*   No results for input(s): LIPASE, AMYLASE in the last 168 hours. No results for input(s): AMMONIA in the last 168 hours. Coagulation Profile: Recent Labs  Lab 06/13/17 2003  INR 1.40   Cardiac Enzymes: Recent Labs  Lab 06/13/17 2003 06/13/17 2243 06/14/17 0558  CKTOTAL 119  --   --   TROPONINI  --  <0.03 <0.03   BNP (last 3 results) No results for input(s): PROBNP in the last 8760 hours. HbA1C: No results for input(s): HGBA1C in the last 72 hours. CBG: No results for input(s): GLUCAP in the last 168 hours. Lipid Profile: No results for input(s): CHOL, HDL, LDLCALC, TRIG, CHOLHDL, LDLDIRECT in the last 72 hours. Thyroid Function Tests: No results for input(s): TSH, T4TOTAL, FREET4, T3FREE, THYROIDAB in the last 72 hours. Anemia Panel: No results for input(s): VITAMINB12, FOLATE, FERRITIN, TIBC, IRON, RETICCTPCT in the last 72 hours.    Radiology Studies: I have reviewed all of the imaging during this hospital visit personally     Scheduled Meds: . famotidine  10 mg Oral Daily  . multivitamin with minerals  1 tablet Oral Daily  . thiamine  100 mg Oral Daily   Or  . thiamine  100 mg Intravenous Daily   Continuous Infusions: . sodium chloride 65 mL/hr at 06/18/17 1751     LOS: 6 days        Malayshia All Gerome Apley, MD [facility] Pager [phone]

## 2017-06-19 NOTE — Progress Notes (Signed)
[facility] for Heparin/Warfarin Indication: pulmonary embolus, tx with Xarelto PTA  No Known Allergies  Patient Measurements: Height: 6\' 4"  (193 cm) Weight: 170 lb 10.2 oz (77.4 kg) IBW/kg (Calculated) : 86.8 Heparin Dosing Weight: 77.4kg  Vital Signs: Temp: 98.4 F (36.9 C) (04/30 2117) Temp Source: Oral (04/30 2117) BP: 154/77 (04/30 2117) Pulse Rate: 58 (04/30 2117)  Labs: Recent Labs    06/17/17 0359 06/18/17 0410 06/19/17 0409 06/19/17 1030 06/19/17 2136  APTT  --   --   --  29  --   LABPROT  --   --   --  13.2  --   INR  --   --   --  1.01  --   HEPARINUNFRC  --   --   --   --  0.51  CREATININE 13.11* 13.15* 12.43*  --   --    Estimated Creatinine Clearance: 6.6 mL/min (A) (by C-G formula based on SCr of 12.43 mg/dL (H)).  Medical History: Past Medical History:  Diagnosis Date  . ABNORMAL ELECTROCARDIOGRAM 11/02/2008  . ABSCESS, LUNG 10/23/2006  . Anemia 05/26/2014  . BACTERIAL PNEUMONIA 12/28/2009  . BPH (benign prostatic hyperplasia) 11/22/2010  . 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 CHEST PAIN 12/24/2009  . Coronary artery calcification seen on CT scan 06/01/2015  . Cramp of limb 07/19/2007  . DIVERTICULOSIS, COLON 03/07/2007  . DVT (deep venous thrombosis) ([place]) 01/2014   LLE  . Emphysema of lung ([place])   . 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/23/2006  . FLANK PAIN, LEFT 02/25/2010  . FREQUENCY, URINARY 12/24/2009  . HEMORRHOIDS 10/23/2006  . HYPERLIPIDEMIA 10/23/2006  . HYPERTENSION 10/23/2006  . PE (pulmonary embolism) 02/2014  . PLANTAR FASCIITIS, LEFT 11/02/2008  . Pneumonia 12/2013 X 2  . PSA, INCREASED 11/20/2007  . PULMONARY NODULE 05/14/2008  . Unspecified Peripheral Vascular Disease 10/23/2006   Medications:  Scheduled:  . famotidine  10 mg Oral Daily  . multivitamin with minerals  1 tablet Oral Daily  . thiamine  100 mg Oral Daily   Or  . thiamine  100 mg Intravenous Daily  . warfarin   Does not apply Once  . Warfarin - Pharmacist  Dosing Inpatient   Does not apply q1800   Infusions:  . sodium chloride 65 mL/hr at 06/18/17 1751  . heparin 1,250 Units/hr (06/19/17 1343)    Assessment: 50 yoM with Non-oliguric AKI (baseline SCr 1.1-1.4), likely pre-renal/low BP on Lisinopril  - Pt reports dark vomitus 4 days ago, FOBT PMH: COPD, BPH, CVA, HLD, HTN, DVT/ PE on Xarelto, h/o acute PE in 2018 when stopped AC s/p hemopneumothorax, IVC filter (REMOVED), EtOH abuse, PVD, and CKD 3a  Home dose Xarelto 10mg  daily, last dose 4/24  Today, 06/19/2017 Begin Heparin and Warfarin, unable to use Xarelto with severe renal dysfunction Ordered baseline INR Ordered aPTT: anticipate Xarelto effect dissipated, but will ensure before starting Heparin 2136 HL=0.51 at goal, no infusion or bleeding issues per RN.   Goal of Therapy:  INR 2-3 Heparin level 0.3-0.7 units/ml Monitor platelets by anticoagulation protocol: Yes   Plan:  Continue heparin drip at 1250 units/hr Daily PT/INR, CBC, HL  Dorrene German 06/19/2017, 10:32 PM

## 2017-06-20 DIAGNOSIS — D631 Anemia in chronic kidney disease: Secondary | ICD-10-CM

## 2017-06-20 DIAGNOSIS — N189 Chronic kidney disease, unspecified: Secondary | ICD-10-CM

## 2017-06-20 LAB — BASIC METABOLIC PANEL
Anion gap: 14 (ref 5–15)
BUN: 84 mg/dL — AB (ref 6–20)
CALCIUM: 7.6 mg/dL — AB (ref 8.9–10.3)
CHLORIDE: 111 mmol/L (ref 101–111)
CO2: 18 mmol/L — AB (ref 22–32)
Creatinine, Ser: 12.46 mg/dL — ABNORMAL HIGH (ref 0.61–1.24)
GFR calc Af Amer: 4 mL/min — ABNORMAL LOW (ref >60)
GFR calc non Af Amer: 4 mL/min — ABNORMAL LOW (ref >60)
Glucose, Bld: 85 mg/dL (ref 65–99)
Potassium: 3.7 mmol/L (ref 3.5–5.1)
Sodium: 143 mmol/L (ref 135–145)

## 2017-06-20 LAB — CBC WITH DIFFERENTIAL/PLATELET
BASOS PCT: 1 %
Basophils Absolute: 0.1 10*3/uL (ref 0.0–0.1)
EOS ABS: 0.1 10*3/uL (ref 0.0–0.7)
Eosinophils Relative: 2 %
HEMATOCRIT: 25.1 % — AB (ref 39.0–52.0)
HEMOGLOBIN: 8.6 g/dL — AB (ref 13.0–17.0)
LYMPHS ABS: 2.2 10*3/uL (ref 0.7–4.0)
LYMPHS PCT: 34 %
MCH: 31 pg (ref 26.0–34.0)
MCHC: 34.3 g/dL (ref 30.0–36.0)
MCV: 90.6 fL (ref 78.0–100.0)
Monocytes Absolute: 0.8 10*3/uL (ref 0.1–1.0)
Monocytes Relative: 12 %
NEUTROS ABS: 3.3 10*3/uL (ref 1.7–7.7)
Neutrophils Relative %: 51 %
Platelets: 160 10*3/uL (ref 150–400)
RBC: 2.77 MIL/uL — ABNORMAL LOW (ref 4.22–5.81)
RDW: 16.8 % — AB (ref 11.5–15.5)
WBC: 6.5 10*3/uL (ref 4.0–10.5)

## 2017-06-20 LAB — PROTIME-INR
INR: 1.13
Prothrombin Time: 14.4 seconds (ref 11.4–15.2)

## 2017-06-20 LAB — HEPARIN LEVEL (UNFRACTIONATED): Heparin Unfractionated: 0.47 IU/mL (ref 0.30–0.70)

## 2017-06-20 MED ORDER — WARFARIN SODIUM 2.5 MG PO TABS
7.5000 mg | ORAL_TABLET | Freq: Once | ORAL | Status: AC
  Administered 2017-06-20: 7.5 mg via ORAL
  Filled 2017-06-20: qty 1

## 2017-06-20 MED ORDER — SODIUM CHLORIDE 0.45 % IV SOLN
INTRAVENOUS | Status: DC
  Administered 2017-06-20 – 2017-06-22 (×2): via INTRAVENOUS

## 2017-06-20 NOTE — Progress Notes (Signed)
PROGRESS NOTE    Charles [name]  LDJ:[id number] DOB: [birth year] DOA: 06/13/2017 PCP: Biagio Borg, MD    Brief Narrative:  67 year old male who presented with worsening kidney function. He does have thesignificant past medical history of COPD, BPH, cerebellar CVA, history of DVT/PE status post IVC filter, hypertension, dyslipidemia and history of spontaneous pneumothorax. For the last 5 days prior to hospitalization, patient experienced withdrawal symptoms consistent with nausea, decreased p.o. intake and difficulty ambulating.He was seen by his primary care physician who found him in acute renal failure, hewas referred to the hospital for further evaluation. On the initial physical examination blood pressure 96/60, heart rate 90, respiratory rate 15, temperature 98.3, oxygen saturation 100%. Dry mucous membranes, lungs clear to auscultation bilaterally, heart S1-S2 present rhythmic, the abdomen soft nontender, no lower extremity edema. Sodium 133, potassium 3.9, chloride 106, bicarb 18, glucose 105, BUN 93, creatinine 11.16, white count 9.6, hemoglobin 7.3,hematocrit 20.4, platelets 62.Urinalysis pH 5.0, protein 100, specific gravity 1.009, RBCs greater than 50, white cells 21-50,urine sodium 33,fecal occult blood positive.CT of the abdomen negative for hydronephrosis or ureteral stone.Chest x-ray with hyperinflation right base scaring (chronic).EKG normal sinus rhythm, normal axis normal intervals.  Patient was admitted to the hospitalwith theworking diagnosis of worsening renal failure,acute kidney injury, complicated of symptomatic anemia, to rule out acute blood loss, due to upper GI bleed.     Assessment & Plan:   Principal Problem:   Acute renal failure superimposed on stage 3 chronic kidney disease (HCC) Active Problems:   Hyperlipidemia   Unspecified Peripheral Vascular Disease   GERD   Abnormal LFTs   BPH (benign prostatic hyperplasia)   Anemia   Gross  hematuria   Dehydration   Symptomatic anemia   COPD not affecting current episode of care ([place])   Hx of pulmonary embolus   Thrombocytopenia (HCC)   Alcohol abuse   Acute renal failure (ARF) (HCC)   AKI (acute kidney injury) ([place])   Melena  #1 acute on chronic kidney disease stage II/III secondary to ATN with non-anion gap metabolic acidosis (baseline creatinine 1.1-1.4) Likely secondary to ATN due to hypovolemia in the setting of ACE inhibitor.  Pending on admission was 10.59 which peaked at 13.15 and seems to be plateauing.  And currently at 12.46 today from 12.43 yesterday.  Patient with good urine output of 2.480 L over the past 24 hours.  Patient denies any nausea vomiting.  No abdominal pain.  No fatigue.  No change in appetite.  Renal ultrasound negative for hydronephrosis.  Nephrology following and has discontinued patient's PPI.  Bladder scan ordered.  IV fluid rate has been increased to 75 cc/h per nephrology.  Follow.  2.  Hypertension Blood pressure stable.  ACE inhibitor, calcium channel blocker on hold.  Follow H&H.  3.  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  Patient currently on IV heparin and Coumadin with goal INR 2-3.  Patient will likely be discharged home on Coumadin pending improvement with his renal function will need outpatient follow-up with his PCP.  4.  Shock liver/transaminitis Likely secondary to hypotension and volume depletion.  Improved with hydration.  Follow.  5.  Symptomatic anemia/anemia of chronic disease Patient seen in consultation by gastroenterology and upper GI bleed ruled out by upper endoscopy which was done on 06/15/2017.  Status post 2 units packed red blood cells during  his hospitalization.  Hemoglobin currently at 8.6.  Follow H&H.  Transfusion threshold hemoglobin less than 7.  6.  Alcohol abuse Alcohol  cessation.  With no signs of DTs.  No withdrawal symptoms.  Monitor.  7.  Thrombocytopenia Likely reactive.  No signs or symptoms of bleeding.  Improved.   DVT prophylaxis: Heparin/coumadin Code Status: Full Family Communication: Updated patient.  No family at bedside. Disposition Plan: Home once renal function close to baseline and per nephrology.   Consultants:   Nephrology: Dr. Hollie Salk for 24 2019  Gastroenterology: Dr. Hilarie Fredrickson 06/14/2017  Procedures:   Upper endoscopy Dr. Hilarie Fredrickson 06/15/2017  CT abdomen and pelvis 06/13/2017  Chest x-ray 06/13/2017  Renal ultrasound 06/13/2017  2D echo 06/14/2017  Antimicrobials:   None   Subjective: Sleeping easily arousable.  Denies any chest pain or shortness of breath.  No nausea or vomiting.  States has good urine output.  No abdominal pain.  Denies any fatigue.  Objective: Vitals:   06/19/17 0530 06/19/17 1303 06/19/17 2117 06/20/17 0350  BP: 112/86 135/71 (!) 154/77 136/71  Pulse: 88 60 (!) 58 61  Resp: 18 16  16   Temp: 98.4 F (36.9 C) 97.6 F (36.4 C) 98.4 F (36.9 C) 98.1 F (36.7 C)  TempSrc: Oral Oral Oral Oral  SpO2: 100% 98% 100% 100%  Weight:      Height:        Intake/Output Summary (Last 24 hours) at 06/20/2017 1213 Last data filed at 06/20/2017 0405 Gross per 24 hour  Intake 2212.83 ml  Output 1680 ml  Net 532.83 ml   Filed Weights   06/14/17 0825 06/15/17 0423  Weight: 75 kg (165 lb 5.5 oz) 77.4 kg (170 lb 10.2 oz)    Examination:  General exam: Appears calm and comfortable  Respiratory system: Clear to auscultation. Respiratory effort normal. Cardiovascular system: S1 & S2 heard, RRR. No JVD, murmurs, rubs, gallops or clicks. No pedal edema. Gastrointestinal system: Abdomen is nondistended, soft and nontender. No organomegaly or masses felt. Normal bowel sounds heard. Central nervous system: Alert and oriented. No focal neurological deficits. Extremities: Symmetric 5 x 5 power. Skin: No rashes,  lesions or ulcers Psychiatry: Judgement and insight appear normal. Mood & affect appropriate.     Data Reviewed: I have personally reviewed following labs and imaging studies  CBC: Recent Labs  Lab 06/13/17 1736 06/13/17 2142 06/13/17 2345 06/14/17 0558 06/14/17 1544 06/20/17 0336  WBC 9.6  --  9.0 9.1  --  6.5  NEUTROABS  --  6.9  --   --   --  3.3  HGB 7.3*  --  7.0* 7.5* 9.7* 8.6*  HCT 20.4*  --  20.5* 21.4* 28.2* 25.1*  MCV 97.1  --  96.7 94.3  --  90.6  PLT 62*  --  60* 53*  --  010   Basic Metabolic Panel: Recent Labs  Lab 06/14/17 0558 06/15/17 0701 06/16/17 0340 06/17/17 0359 06/18/17 0410 06/19/17 0409 06/20/17 0336  NA 135 137 139 141 143 142 143  K 3.4* 3.6 3.8 3.6 3.7 3.4* 3.7  CL 107 108 111 108 110 107 111  CO2 16* 16* 14* 19* 21* 21* 18*  GLUCOSE 84 96 92 90 89 79 85  BUN 91* 89* 89* 87* 91* 83* 84*  CREATININE 11.43* 12.12* 12.41* 13.11* 13.15* 12.43* 12.46*  CALCIUM 8.1* 8.2* 8.0* 8.0* 7.8* 7.6* 7.6*  MG 1.9  --   --   --   --   --   --  PHOS 3.9 4.6  --   --   --   --   --    GFR: Estimated Creatinine Clearance: 6.6 mL/min (A) (by C-G formula based on SCr of 12.46 mg/dL (H)). Liver Function Tests: Recent Labs  Lab 06/13/17 1921 06/13/17 2142 06/14/17 0558 06/15/17 0701 06/18/17 0410  AST 259*  --  163*  --  32  ALT 450*  --  329*  --  101*  ALKPHOS 84  --  70  --  54  BILITOT 1.9* 1.6* 1.5*  --  1.1  PROT 7.1  --  6.0*  --  6.4*  ALBUMIN 3.0*  --  2.7* 2.7* 2.7*   No results for input(s): LIPASE, AMYLASE in the last 168 hours. No results for input(s): AMMONIA in the last 168 hours. Coagulation Profile: Recent Labs  Lab 06/13/17 2003 06/19/17 1030 06/20/17 0336  INR 1.40 1.01 1.13   Cardiac Enzymes: Recent Labs  Lab 06/13/17 2003 06/13/17 2243 06/14/17 0558  CKTOTAL 119  --   --   TROPONINI  --  <0.03 <0.03   BNP (last 3 results) No results for input(s): PROBNP in the last 8760 hours. HbA1C: No results for input(s):  HGBA1C in the last 72 hours. CBG: No results for input(s): GLUCAP in the last 168 hours. Lipid Profile: No results for input(s): CHOL, HDL, LDLCALC, TRIG, CHOLHDL, LDLDIRECT in the last 72 hours. Thyroid Function Tests: No results for input(s): TSH, T4TOTAL, FREET4, T3FREE, THYROIDAB in the last 72 hours. Anemia Panel: No results for input(s): VITAMINB12, FOLATE, FERRITIN, TIBC, IRON, RETICCTPCT in the last 72 hours. Sepsis Labs: No results for input(s): PROCALCITON, LATICACIDVEN in the last 168 hours.  Recent Results (from the past 240 hour(s))  MRSA PCR Screening     Status: None   Collection Time: 06/14/17  3:30 AM  Result Value Ref Range Status   MRSA by PCR NEGATIVE NEGATIVE Final    Comment:        The GeneXpert MRSA Assay (FDA approved for NASAL specimens only), is one component of a comprehensive MRSA colonization surveillance program. It is not intended to diagnose MRSA infection nor to guide or monitor treatment for MRSA infections. Performed at [facility], [place] [address] [place], [place] [address]          Radiology Studies: No results found.      Scheduled Meds: . multivitamin with minerals  1 tablet Oral Daily  . thiamine  100 mg Oral Daily   Or  . thiamine  100 mg Intravenous Daily  . warfarin  7.5 mg Oral ONCE-1800  . warfarin   Does not apply Once  . Warfarin - Pharmacist Dosing Inpatient   Does not apply q1800   Continuous Infusions: . sodium chloride 75 mL/hr at 06/20/17 0850  . heparin 1,250 Units/hr (06/20/17 0852)     LOS: 7 days    Time spent: 35 minutes    Irine Seal, MD [facility] Pager [phone]  If 7PM-7AM, please contact night-coverage [email or web address] Password [email or web address] 06/20/2017, 12:13 PM

## 2017-06-20 NOTE — Progress Notes (Signed)
[facility] Progress Note  Subjective: creat 12.4 again today 2.4 L in and 2.4 L out yest pt w/o any c/o's no n/v no appetite issues or fatigue no abd pain or voiding issues  Vitals:   06/19/17 0530 06/19/17 1303 06/19/17 2117 06/20/17 0350  BP: 112/86 135/71 (!) 154/77 136/71  Pulse: 88 60 (!) 58 61  Resp: 18 16  16  Temp: 98.4 F (36.9 C) 97.6 F (36.4 C) 98.4 F (36.9 C) 98.1 F (36.7 C)  TempSrc: Oral Oral Oral Oral  SpO2: 100% 98% 100% 100%  Weight:      Height:        Inpatient medications: . famotidine  10 mg Oral Daily  . multivitamin with minerals  1 tablet Oral Daily  . thiamine  100 mg Oral Daily   Or  . thiamine  100 mg Intravenous Daily  . warfarin  7.5 mg Oral ONCE-1800  . warfarin   Does not apply Once  . Warfarin - Pharmacist Dosing Inpatient   Does not apply q1800   . sodium chloride 65 mL/hr at 06/18/17 1751  . sodium chloride 75 mL/hr at 06/20/17 0850  . heparin 1,250 Units/hr (06/20/17 0852)   acetaminophen **OR** acetaminophen, HYDROcodone-acetaminophen  Exam: Alert, no distress No jvd Chest cta bilat Cor reg no mrg Abd soft ntnd Ext no edema   date  Creat   egfr  2008- 17 1.0- 1.3  2018   1.15 - 2.33 >60  aug 2018 1.80     apr 24  10.59  apr 26  12.12  apr 29 today 13.15  Home meds  - xarelto/ cialis/ crestor/ zetia - norvasc 10/ prinivil qd   ua 4/24 > prot 100 , >50 rbc/ hpf, 21-50 wbc/ hpf, 0-5 epi  repeat ua 4/29 > negative  renal us 4/24 bilat echogenic kidneys, 11cm, normal size no hydro  urine sod 33  Urine creat 94  Impression: 1 acute kidney injury/ aki - severe aki suspected atn due to hypovol/ acei - mild ckd 2 underlying - creat did not drop today unfortunately not sure why he is not vol depleted and bp's are normal > will dc ppi, check bladder scans and ^ivf to 75/ hr 2 ckd II w/ baseline creat 1.1- 1.4 3 etoh abuse 4 anemia/ low plts w gi bleed - sp prbcs 5 ^lfts  6 hypertension holding meds,  bp's normal 7 copd  8 hx pulm embolus 2018 9 hx cva 2016  Plan - as above   Rob  MD [facility] pager [phone]   06/20/2017, 9:05 AM   Recent Labs  Lab 06/14/17 0558 06/15/17 0701  06/18/17 0410 06/19/17 0409 06/20/17 0336  NA 135 137   < > 143 142 143  K 3.4* 3.6   < > 3.7 3.4* 3.7  CL 107 108   < > 110 107 111  CO2 16* 16*   < > 21* 21* 18*  GLUCOSE 84 96   < > 89 79 85  BUN 91* 89*   < > 91* 83* 84*  CREATININE 11.43* 12.12*   < > 13.15* 12.43* 12.46*  CALCIUM 8.1* 8.2*   < > 7.8* 7.6* 7.6*  PHOS 3.9 4.6  --   --   --   --    < > = values in this interval not displayed.   Recent Labs  Lab 06/13/17 1921 06/13/17 2142 06/14/17 0558 06/15/17 0701 06/18/17 0410  AST 259*  --    163*  --  32  ALT 450*  --  329*  --  101*  ALKPHOS 84  --  70  --  54  BILITOT 1.9* 1.6* 1.5*  --  1.1  PROT 7.1  --  6.0*  --  6.4*  ALBUMIN 3.0*  --  2.7* 2.7* 2.7*   Recent Labs  Lab 06/13/17 1047  06/13/17 2142 06/13/17 2345 06/14/17 0558 06/14/17 1544 06/20/17 0336  WBC 8.5   < >  --  9.0 9.1  --  6.5  NEUTROABS 5.8  --  6.9  --   --   --  3.3  HGB 8.2 Repeated and verified X2.*   < >  --  7.0* 7.5* 9.7* 8.6*  HCT 23.5 Repeated and verified X2.*   < >  --  20.5* 21.4* 28.2* 25.1*  MCV 101.1*   < >  --  96.7 94.3  --  90.6  PLT 62.0*   < >  --  60* 53*  --  160   < > = values in this interval not displayed.   Iron/TIBC/Ferritin/ %Sat    Component Value Date/Time   IRON 59 06/13/2017 1921   TIBC 266 06/13/2017 1921   FERRITIN 2,835 (H) 06/13/2017 1921   IRONPCTSAT 22 06/13/2017 1921

## 2017-06-20 NOTE — Progress Notes (Signed)
[facility] for Heparin/Warfarin Indication: pulmonary embolus, tx with Xarelto PTA  No Known Allergies  Patient Measurements: Height: 6\' 4"  (193 cm) Weight: 170 lb 10.2 oz (77.4 kg) IBW/kg (Calculated) : 86.8 Heparin Dosing Weight: 77.4kg  Vital Signs: Temp: 98.1 F (36.7 C) (05/01 0350) Temp Source: Oral (05/01 0350) BP: 136/71 (05/01 0350) Pulse Rate: 61 (05/01 0350)  Labs: Recent Labs    06/18/17 0410 06/19/17 0409 06/19/17 1030 06/19/17 2136 06/20/17 0336  HGB  --   --   --   --  8.6*  HCT  --   --   --   --  25.1*  PLT  --   --   --   --  160  APTT  --   --  29  --   --   LABPROT  --   --  13.2  --  14.4  INR  --   --  1.01  --  1.13  HEPARINUNFRC  --   --   --  0.51 0.47  CREATININE 13.15* 12.43*  --   --  12.46*   Estimated Creatinine Clearance: 6.6 mL/min (A) (by C-G formula based on SCr of 12.46 mg/dL (H)).  Medical History: Past Medical History:  Diagnosis Date  . ABNORMAL ELECTROCARDIOGRAM 11/02/2008  . ABSCESS, LUNG 10/23/2006  . Anemia 05/26/2014  . BACTERIAL PNEUMONIA 12/28/2009  . BPH (benign prostatic hyperplasia) 11/22/2010  . 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 CHEST PAIN 12/24/2009  . Coronary artery calcification seen on CT scan 06/01/2015  . Cramp of limb 07/19/2007  . DIVERTICULOSIS, COLON 03/07/2007  . DVT (deep venous thrombosis) ([place]) 01/2014   LLE  . Emphysema of lung ([place])   . 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/23/2006  . FLANK PAIN, LEFT 02/25/2010  . FREQUENCY, URINARY 12/24/2009  . HEMORRHOIDS 10/23/2006  . HYPERLIPIDEMIA 10/23/2006  . HYPERTENSION 10/23/2006  . PE (pulmonary embolism) 02/2014  . PLANTAR FASCIITIS, LEFT 11/02/2008  . Pneumonia 12/2013 X 2  . PSA, INCREASED 11/20/2007  . PULMONARY NODULE 05/14/2008  . Unspecified Peripheral Vascular Disease 10/23/2006   Medications:  Scheduled:  . famotidine  10 mg Oral Daily  . multivitamin with minerals  1 tablet Oral Daily  . thiamine  100 mg Oral Daily   Or  .  thiamine  100 mg Intravenous Daily  . warfarin   Does not apply Once  . Warfarin - Pharmacist Dosing Inpatient   Does not apply q1800   Infusions:  . sodium chloride 65 mL/hr at 06/18/17 1751  . heparin 1,250 Units/hr (06/19/17 1343)    Assessment: 55 yoM with Non-oliguric AKI (baseline SCr 1.1-1.4), likely pre-renal/low BP on Lisinopril  - Pt reports dark vomitus 4 days ago, FOBT PMH: COPD, BPH, CVA, HLD, HTN, DVT/ PE on Xarelto, h/o acute PE in 2018 when stopped AC s/p hemopneumothorax, IVC filter (REMOVED), EtOH abuse, PVD, and CKD 3a  Home dose Xarelto 10mg  daily, last dose 4/24  Significant events: 4/30 Begin Heparin and Warfarin, unable to use Xarelto with severe renal dysfunction  Today, 06/20/2017 HL= 0.47 therapeutic INR 1.13 after 1 dose of warfarin H/H low Scr 12.46 Eating ~100% of meals  Goal of Therapy:  INR 2-3 Heparin level 0.3-0.7 units/ml Monitor platelets by anticoagulation protocol: Yes   Plan:  Continue heparin drip at 1250 units/hr Warfarin 7.5mg  po at 1800 Daily PT/INR, CBC, HL  Dolly Rias RPh 06/20/2017, 8:08 AM Pager [phone]

## 2017-06-21 ENCOUNTER — Ambulatory Visit: Payer: 59 | Admitting: Internal Medicine

## 2017-06-21 LAB — CBC
HCT: 26 % — ABNORMAL LOW (ref 39.0–52.0)
Hemoglobin: 8.5 g/dL — ABNORMAL LOW (ref 13.0–17.0)
MCH: 29.9 pg (ref 26.0–34.0)
MCHC: 32.7 g/dL (ref 30.0–36.0)
MCV: 91.5 fL (ref 78.0–100.0)
PLATELETS: 172 10*3/uL (ref 150–400)
RBC: 2.84 MIL/uL — AB (ref 4.22–5.81)
RDW: 16.6 % — AB (ref 11.5–15.5)
WBC: 6.6 10*3/uL (ref 4.0–10.5)

## 2017-06-21 LAB — HEPARIN LEVEL (UNFRACTIONATED): HEPARIN UNFRACTIONATED: 0.49 [IU]/mL (ref 0.30–0.70)

## 2017-06-21 LAB — RENAL FUNCTION PANEL
Albumin: 2.7 g/dL — ABNORMAL LOW (ref 3.5–5.0)
Anion gap: 12 (ref 5–15)
BUN: 77 mg/dL — AB (ref 6–20)
CHLORIDE: 110 mmol/L (ref 101–111)
CO2: 21 mmol/L — AB (ref 22–32)
CREATININE: 11.62 mg/dL — AB (ref 0.61–1.24)
Calcium: 8.1 mg/dL — ABNORMAL LOW (ref 8.9–10.3)
GFR calc Af Amer: 5 mL/min — ABNORMAL LOW (ref >60)
GFR, EST NON AFRICAN AMERICAN: 4 mL/min — AB (ref >60)
GLUCOSE: 87 mg/dL (ref 65–99)
POTASSIUM: 3.8 mmol/L (ref 3.5–5.1)
Phosphorus: 5.7 mg/dL — ABNORMAL HIGH (ref 2.5–4.6)
Sodium: 143 mmol/L (ref 135–145)

## 2017-06-21 LAB — PROTIME-INR
INR: 1.22
PROTHROMBIN TIME: 15.3 s — AB (ref 11.4–15.2)

## 2017-06-21 MED ORDER — WARFARIN SODIUM 5 MG PO TABS
7.5000 mg | ORAL_TABLET | Freq: Once | ORAL | Status: AC
  Administered 2017-06-21: 7.5 mg via ORAL
  Filled 2017-06-21: qty 1

## 2017-06-21 NOTE — Progress Notes (Signed)
ANTICOAGULATION CONSULT NOTE  Pharmacy Consult for Heparin/Warfarin Indication: pulmonary embolus, tx with Xarelto PTA  No Known Allergies  Patient Measurements: Height: 6\' 4"  (193 cm) Weight: 164 lb 3.9 oz (74.5 kg) IBW/kg (Calculated) : 86.8 Heparin Dosing Weight: 77.4kg  Vital Signs: Temp: 98.8 F (37.1 C) (05/02 0553) Temp Source: Oral (05/02 0553) BP: 133/76 (05/02 0553) Pulse Rate: 66 (05/02 0553)  Labs: Recent Labs    06/19/17 0409 06/19/17 1030 06/19/17 2136 06/20/17 0336 06/21/17 0336  HGB  --   --   --  8.6* 8.5*  HCT  --   --   --  25.1* 26.0*  PLT  --   --   --  160 172  APTT  --  29  --   --   --   LABPROT  --  13.2  --  14.4 15.3*  INR  --  1.01  --  1.13 1.22  HEPARINUNFRC  --   --  0.51 0.47 0.49  CREATININE 12.43*  --   --  12.46* 11.62*   Estimated Creatinine Clearance: 6.8 mL/min (A) (by C-G formula based on SCr of 11.62 mg/dL (H)).  Medical History: Past Medical History:  Diagnosis Date  . ABNORMAL ELECTROCARDIOGRAM 11/02/2008  . ABSCESS, LUNG 10/23/2006  . Anemia 05/26/2014  . BACTERIAL PNEUMONIA 12/28/2009  . BPH (benign prostatic hyperplasia) 11/22/2010  . 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 CHEST PAIN 12/24/2009  . Coronary artery calcification seen on CT scan 06/01/2015  . Cramp of limb 07/19/2007  . DIVERTICULOSIS, COLON 03/07/2007  . DVT (deep venous thrombosis) ([place]) 01/2014   LLE  . Emphysema of lung ([place])   . 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/23/2006  . FLANK PAIN, LEFT 02/25/2010  . FREQUENCY, URINARY 12/24/2009  . HEMORRHOIDS 10/23/2006  . HYPERLIPIDEMIA 10/23/2006  . HYPERTENSION 10/23/2006  . PE (pulmonary embolism) 02/2014  . PLANTAR FASCIITIS, LEFT 11/02/2008  . Pneumonia 12/2013 X 2  . PSA, INCREASED 11/20/2007  . PULMONARY NODULE 05/14/2008  . Unspecified Peripheral Vascular Disease 10/23/2006   Medications:  Scheduled:  . multivitamin with minerals  1 tablet Oral Daily  . thiamine  100 mg Oral Daily   Or  . thiamine  100 mg Intravenous Daily   . warfarin   Does not apply Once  . Warfarin - Pharmacist Dosing Inpatient   Does not apply q1800   Infusions:  . sodium chloride 75 mL/hr at 06/20/17 0850  . heparin 1,250 Units/hr (06/21/17 0610)    Assessment: 39 yoM with Non-oliguric AKI (baseline SCr 1.1-1.4), likely pre-renal/low BP on Lisinopril  - Pt reports dark vomitus 4 days ago, FOBT PMH: COPD, BPH, CVA, HLD, HTN, DVT/ PE on Xarelto, h/o acute PE in 2018 when stopped AC s/p hemopneumothorax, IVC filter (REMOVED), EtOH abuse, PVD, and CKD 3a  Home dose Xarelto 10mg  daily, last dose 4/24  Significant events: 4/30 Begin Heparin and Warfarin, unable to use Xarelto with severe renal dysfunction  Today, 06/21/2017 HL= 0.49 therapeutic INR 1.22 after 2 doses of warfarin H/H low, stable Scr 11.62 Eating ~100% of meals  Goal of Therapy:  INR 2-3 Heparin level 0.3-0.7 units/ml Monitor platelets by anticoagulation protocol: Yes   Plan:  Continue heparin drip at 1250 units/hr Warfarin 7.5mg  po at 1800 Daily PT/INR, CBC, HL  Dolly Rias RPh 06/21/2017, 8:00 AM Pager [phone]

## 2017-06-21 NOTE — Progress Notes (Signed)
[facility] Progress Note  Subjective: creat down 11.6, pt w/o any c/o's, good appetite  Vitals:   06/20/17 0350 06/20/17 1647 06/20/17 2241 06/21/17 0553  BP: 136/71 118/83 129/77 133/76  Pulse: 61 73 80 66  Resp: 16 18 20 18   Temp: 98.1 F (36.7 C) 99.7 F (37.6 C) 98.5 F (36.9 C) 98.8 F (37.1 C)  TempSrc: Oral Oral Oral Oral  SpO2: 100% 100% 100% 100%  Weight:    74.5 kg (164 lb 3.9 oz)  Height:        Inpatient medications: . multivitamin with minerals  1 tablet Oral Daily  . thiamine  100 mg Oral Daily   Or  . thiamine  100 mg Intravenous Daily  . warfarin  7.5 mg Oral ONCE-1800  . warfarin   Does not apply Once  . Warfarin - Pharmacist Dosing Inpatient   Does not apply q1800   . sodium chloride 75 mL/hr at 06/20/17 0850  . heparin 1,250 Units/hr (06/21/17 0610)   acetaminophen **OR** acetaminophen, HYDROcodone-acetaminophen  Exam: Alert, no distress No jvd Chest cta bilat Cor reg no mrg Abd soft ntnd Ext no edema   date  Creat   egfr  2008- 17 1.0- 1.3  2018   1.15 - 2.33 >60  aug 2018 1.80     apr 24  10.59  apr 26  12.12  apr 29 today 13.15  Home meds  - xarelto/ cialis/ crestor/ zetia - norvasc 10/ prinivil qd   ua 4/24 > prot 100 , >50 rbc/ hpf, 21-50 wbc/ hpf, 0-5 epi  repeat ua 4/29 > negative  renal US 4/24 bilat echogenic kidneys, 11cm, normal size no hydro  urine sod 33  Urine creat 94  Impression: 1 acute kidney injury/ aki - severe aki suspected atn due to hypovol/ acei - ckd 2/3 underlying - creat down today, cont supportive care and IVF's at 75/hr 2 ckd II w/ baseline creat 1.1- 1.4 3 etoh abuse 4 anemia/ low plts w gi bleed - sp prbcs 5 ^lfts  6 hypertension holding meds, bp's normal 7 copd  8 hx pulm embolus 2018 9 hx cva 2016  Plan - as above   Kelly Splinter MD [facility] pager [phone]   06/21/2017, 10:03 AM   Recent Labs  Lab 06/15/17 0701  06/19/17 0409 06/20/17 0336  06/21/17 0336  NA 137   < > 142 143 143  K 3.6   < > 3.4* 3.7 3.8  CL 108   < > 107 111 110  CO2 16*   < > 21* 18* 21*  GLUCOSE 96   < > 79 85 87  BUN 89*   < > 83* 84* 77*  CREATININE 12.12*   < > 12.43* 12.46* 11.62*  CALCIUM 8.2*   < > 7.6* 7.6* 8.1*  PHOS 4.6  --   --   --  5.7*   < > = values in this interval not displayed.   Recent Labs  Lab 06/15/17 0701 06/18/17 0410 06/21/17 0336  AST  --  32  --   ALT  --  101*  --   ALKPHOS  --  54  --   BILITOT  --  1.1  --   PROT  --  6.4*  --   ALBUMIN 2.7* 2.7* 2.7*   Recent Labs  Lab 06/14/17 1544 06/20/17 0336 06/21/17 0336  WBC  --  6.5 6.6  NEUTROABS  --  3.3  --  HGB 9.7* 8.6* 8.5*  HCT 28.2* 25.1* 26.0*  MCV  --  90.6 91.5  PLT  --  160 172   Iron/TIBC/Ferritin/ %Sat    Component Value Date/Time   IRON 59 06/13/2017 1921   TIBC 266 06/13/2017 1921   FERRITIN 2,835 (H) 06/13/2017 1921   IRONPCTSAT 22 06/13/2017 1921

## 2017-06-21 NOTE — Progress Notes (Signed)
PROGRESS NOTE    Charles [name]  PRF:[id number] DOB: 01-01-1951 DOA: 06/13/2017 PCP: Biagio Borg, MD    Brief Narrative:  67 year old male who presented with worsening kidney function. He does have thesignificant past medical history of COPD, BPH, cerebellar CVA, history of DVT/PE status post IVC filter, hypertension, dyslipidemia and history of spontaneous pneumothorax. For the last 5 days prior to hospitalization, patient experienced withdrawal symptoms consistent with nausea, decreased p.o. intake and difficulty ambulating.He was seen by his primary care physician who found him in acute renal failure, hewas referred to the hospital for further evaluation. On the initial physical examination blood pressure 96/60, heart rate 90, respiratory rate 15, temperature 98.3, oxygen saturation 100%. Dry mucous membranes, lungs clear to auscultation bilaterally, heart S1-S2 present rhythmic, the abdomen soft nontender, no lower extremity edema. Sodium 133, potassium 3.9, chloride 106, bicarb 18, glucose 105, BUN 93, creatinine 11.16, white count 9.6, hemoglobin 7.3,hematocrit 20.4, platelets 62.Urinalysis pH 5.0, protein 100, specific gravity 1.009, RBCs greater than 50, white cells 21-50,urine sodium 33,fecal occult blood positive.CT of the abdomen negative for hydronephrosis or ureteral stone.Chest x-ray with hyperinflation right base scaring (chronic).EKG normal sinus rhythm, normal axis normal intervals.  Patient was admitted to the hospitalwith theworking diagnosis of worsening renal failure,acute kidney injury, complicated of symptomatic anemia, to rule out acute blood loss, due to upper GI bleed.     Assessment & Plan:   Principal Problem:   Acute renal failure superimposed on stage 3 chronic kidney disease (HCC) Active Problems:   Hyperlipidemia   Unspecified Peripheral Vascular Disease   GERD   Abnormal LFTs   BPH (benign prostatic hyperplasia)   Anemia   Gross  hematuria   Dehydration   Symptomatic anemia   COPD not affecting current episode of care ([place])   Hx of pulmonary embolus   Thrombocytopenia (HCC)   Alcohol abuse   Acute renal failure (ARF) (HCC)   AKI (acute kidney injury) ([place])   Melena  #1 acute on chronic kidney disease stage II/III secondary to ATN with non-anion gap metabolic acidosis (baseline creatinine 1.1-1.4) Likely secondary to ATN due to hypovolemia in the setting of ACE inhibitor. On admission renal function was 10.59 which peaked at 13.15 and seems to be plateauing. and currently at 11.62 from 12.46 from 12.43.  Patient with good urine output of 4.780 L over the past 24 hours.  Patient denies any nausea vomiting.  No abdominal pain.  No fatigue.  No change in appetite.  Renal ultrasound negative for hydronephrosis.  Nephrology following and has discontinued patient's PPI.  IV fluids have been increased to 75 cc/h.  Follow for now.   2.  Hypertension Blood pressure currently at 145/83.  ACE inhibitor and calcium channel blocker still on hold.  Continue hydration.  Follow.   3.  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  Patient currently on IV heparin and Coumadin with goal INR 2-3.  INR currently at 1.22.  Patient will likely be discharged home on Coumadin pending improvement with his renal function will need outpatient follow-up with his PCP.  4.  Shock liver/transaminitis Likely secondary to hypotension and volume depletion.  Improved with hydration.  Follow for now.   5.  Symptomatic anemia/anemia of chronic disease Patient seen in consultation by gastroenterology and upper GI bleed ruled out by upper endoscopy which was done on 06/15/2017.  Status post 2 units packed red blood cells during his hospitalization.  Hemoglobin stable currently at 8.5.  Outpatient follow-up.  6.  Alcohol  abuse Alcohol cessation.  Patient with no signs of DTs or withdrawal symptoms.  Currently stable.    7.  Thrombocytopenia Likely reactive.  No bleeding.  Improved.    DVT prophylaxis: Heparin/coumadin Code Status: Full Family Communication: Updated patient.  No family at bedside. Disposition Plan: Home once renal function close to baseline and per nephrology.   Consultants:   Nephrology: Dr. Hollie Salk for 24 2019  Gastroenterology: Dr. Hilarie Fredrickson 06/14/2017  Procedures:   Upper endoscopy Dr. Hilarie Fredrickson 06/15/2017  CT abdomen and pelvis 06/13/2017  Chest x-ray 06/13/2017  Renal ultrasound 06/13/2017  2D echo 06/14/2017  Antimicrobials:   None   Subjective: Patient denies any chest pain.  No shortness of breath.  No nausea no emesis.  Still with good urine output.  No abdominal pain.  Gait instability improved.    Objective: Vitals:   06/20/17 0350 06/20/17 1647 06/20/17 2241 06/21/17 0553  BP: 136/71 118/83 129/77 133/76  Pulse: 61 73 80 66  Resp: 16 18 20 18   Temp: 98.1 F (36.7 C) 99.7 F (37.6 C) 98.5 F (36.9 C) 98.8 F (37.1 C)  TempSrc: Oral Oral Oral Oral  SpO2: 100% 100% 100% 100%  Weight:    74.5 kg (164 lb 3.9 oz)  Height:        Intake/Output Summary (Last 24 hours) at 06/21/2017 1237 Last data filed at 06/21/2017 0734 Gross per 24 hour  Intake 1823.96 ml  Output 3200 ml  Net -1376.04 ml   Filed Weights   06/14/17 0825 06/15/17 0423 06/21/17 0553  Weight: 75 kg (165 lb 5.5 oz) 77.4 kg (170 lb 10.2 oz) 74.5 kg (164 lb 3.9 oz)    Examination:  General exam: NAD Respiratory system: Lungs clear to auscultation bilaterally.  No wheezes, no crackles, no rhonchi.  Respiratory effort normal. Cardiovascular system: Regular rate rhythm no murmurs rubs or gallops.  No JVD.  No lower extremity edema. Gastrointestinal system: Abdomen is soft, nontender, nondistended, positive bowel sounds.  No rebound.  No guarding.   Central nervous system: Alert and oriented. No  focal neurological deficits. Extremities: Symmetric 5 x 5 power. Skin: No rashes, lesions or ulcers Psychiatry: Judgement and insight appear normal. Mood & affect appropriate.     Data Reviewed: I have personally reviewed following labs and imaging studies  CBC: Recent Labs  Lab 06/14/17 1544 06/20/17 0336 06/21/17 0336  WBC  --  6.5 6.6  NEUTROABS  --  3.3  --   HGB 9.7* 8.6* 8.5*  HCT 28.2* 25.1* 26.0*  MCV  --  90.6 91.5  PLT  --  160 433   Basic Metabolic Panel: Recent Labs  Lab 06/15/17 0701  06/17/17 0359 06/18/17 0410 06/19/17 0409 06/20/17 0336 06/21/17 0336  NA 137   < > 141 143 142 143 143  K 3.6   < > 3.6 3.7 3.4* 3.7 3.8  CL 108   < > 108 110 107 111 110  CO2 16*   < > 19* 21* 21* 18* 21*  GLUCOSE 96   < > 90 89 79 85 87  BUN 89*   < > 87* 91* 83* 84* 77*  CREATININE 12.12*   < > 13.11* 13.15* 12.43* 12.46* 11.62*  CALCIUM 8.2*   < > 8.0* 7.8* 7.6* 7.6* 8.1*  PHOS 4.6  --   --   --   --   --  5.7*   < > = values in this interval not displayed.   GFR: Estimated Creatinine Clearance: 6.8 mL/min (A) (by C-G formula based on SCr of 11.62 mg/dL (H)). Liver Function Tests: Recent Labs  Lab 06/15/17 0701 06/18/17 0410 06/21/17 0336  AST  --  32  --   ALT  --  101*  --   ALKPHOS  --  54  --   BILITOT  --  1.1  --   PROT  --  6.4*  --   ALBUMIN 2.7* 2.7* 2.7*   No results for input(s): LIPASE, AMYLASE in the last 168 hours. No results for input(s): AMMONIA in the last 168 hours. Coagulation Profile: Recent Labs  Lab 06/19/17 1030 06/20/17 0336 06/21/17 0336  INR 1.01 1.13 1.22   Cardiac Enzymes: No results for input(s): CKTOTAL, CKMB, CKMBINDEX, TROPONINI in the last 168 hours. BNP (last 3 results) No results for input(s): PROBNP in the last 8760 hours. HbA1C: No results for input(s): HGBA1C in the last 72 hours. CBG: No results for input(s): GLUCAP in the last 168 hours. Lipid Profile: No results for input(s): CHOL, HDL, LDLCALC, TRIG,  CHOLHDL, LDLDIRECT in the last 72 hours. Thyroid Function Tests: No results for input(s): TSH, T4TOTAL, FREET4, T3FREE, THYROIDAB in the last 72 hours. Anemia Panel: No results for input(s): VITAMINB12, FOLATE, FERRITIN, TIBC, IRON, RETICCTPCT in the last 72 hours. Sepsis Labs: No results for input(s): PROCALCITON, LATICACIDVEN in the last 168 hours.  Recent Results (from the past 240 hour(s))  MRSA PCR Screening     Status: None   Collection Time: 06/14/17  3:30 AM  Result Value Ref Range Status   MRSA by PCR NEGATIVE NEGATIVE Final    Comment:        The GeneXpert MRSA Assay (FDA approved for NASAL specimens only), is one component of a comprehensive MRSA colonization surveillance program. It is not intended to diagnose MRSA infection nor to guide or monitor treatment for MRSA infections. Performed at [facility], [place] [address] [place], [place] [address]          Radiology Studies: No results found.      Scheduled Meds: . multivitamin with minerals  1 tablet Oral Daily  . thiamine  100 mg Oral Daily   Or  . thiamine  100 mg Intravenous Daily  . warfarin  7.5 mg Oral ONCE-1800  . warfarin   Does not apply Once  . Warfarin - Pharmacist Dosing Inpatient   Does not apply q1800   Continuous Infusions: . sodium chloride 75 mL/hr at 06/20/17 0850  . heparin 1,250 Units/hr (06/21/17 0610)     LOS: 8 days    Time spent: 35 minutes    Irine Seal, MD [facility] Pager [phone]  If 7PM-7AM, please contact night-coverage [email or web address] Password [facility] 06/21/2017, 12:37 PM

## 2017-06-22 LAB — RENAL FUNCTION PANEL
ALBUMIN: 2.8 g/dL — AB (ref 3.5–5.0)
ANION GAP: 12 (ref 5–15)
BUN: 74 mg/dL — ABNORMAL HIGH (ref 6–20)
CALCIUM: 8.2 mg/dL — AB (ref 8.9–10.3)
CO2: 19 mmol/L — ABNORMAL LOW (ref 22–32)
Chloride: 112 mmol/L — ABNORMAL HIGH (ref 101–111)
Creatinine, Ser: 10.88 mg/dL — ABNORMAL HIGH (ref 0.61–1.24)
GFR calc Af Amer: 5 mL/min — ABNORMAL LOW (ref >60)
GFR, EST NON AFRICAN AMERICAN: 4 mL/min — AB (ref >60)
Glucose, Bld: 84 mg/dL (ref 65–99)
PHOSPHORUS: 5.7 mg/dL — AB (ref 2.5–4.6)
POTASSIUM: 3.8 mmol/L (ref 3.5–5.1)
SODIUM: 143 mmol/L (ref 135–145)

## 2017-06-22 LAB — PROTIME-INR
INR: 1.47
Prothrombin Time: 17.7 seconds — ABNORMAL HIGH (ref 11.4–15.2)

## 2017-06-22 LAB — HEPARIN LEVEL (UNFRACTIONATED): HEPARIN UNFRACTIONATED: 0.56 [IU]/mL (ref 0.30–0.70)

## 2017-06-22 LAB — CBC
HCT: 24.6 % — ABNORMAL LOW (ref 39.0–52.0)
Hemoglobin: 8.2 g/dL — ABNORMAL LOW (ref 13.0–17.0)
MCH: 30.4 pg (ref 26.0–34.0)
MCHC: 33.3 g/dL (ref 30.0–36.0)
MCV: 91.1 fL (ref 78.0–100.0)
PLATELETS: 165 10*3/uL (ref 150–400)
RBC: 2.7 MIL/uL — AB (ref 4.22–5.81)
RDW: 16.7 % — ABNORMAL HIGH (ref 11.5–15.5)
WBC: 7 10*3/uL (ref 4.0–10.5)

## 2017-06-22 MED ORDER — DEXTROSE-NACL 5-0.2 % IV SOLN
INTRAVENOUS | Status: DC
  Administered 2017-06-22: 11:00:00 via INTRAVENOUS

## 2017-06-22 MED ORDER — TRAZODONE HCL 50 MG PO TABS
50.0000 mg | ORAL_TABLET | Freq: Once | ORAL | Status: AC
  Administered 2017-06-22: 50 mg via ORAL
  Filled 2017-06-22: qty 1

## 2017-06-22 MED ORDER — WARFARIN SODIUM 2.5 MG PO TABS
7.5000 mg | ORAL_TABLET | Freq: Once | ORAL | Status: AC
  Administered 2017-06-22: 7.5 mg via ORAL
  Filled 2017-06-22: qty 1

## 2017-06-22 NOTE — Progress Notes (Signed)
PROGRESS NOTE    Charles [name]  ZYS:[id number] DOB: Apr 10, 1950 DOA: 06/13/2017 PCP: Biagio Borg, MD    Brief Narrative:  67 year old male who presented with worsening kidney function. He does have thesignificant past medical history of COPD, BPH, cerebellar CVA, history of DVT/PE status post IVC filter, hypertension, dyslipidemia and history of spontaneous pneumothorax. For the last 5 days prior to hospitalization, patient experienced withdrawal symptoms consistent with nausea, decreased p.o. intake and difficulty ambulating.He was seen by his primary care physician who found him in acute renal failure, hewas referred to the hospital for further evaluation. On the initial physical examination blood pressure 96/60, heart rate 90, respiratory rate 15, temperature 98.3, oxygen saturation 100%. Dry mucous membranes, lungs clear to auscultation bilaterally, heart S1-S2 present rhythmic, the abdomen soft nontender, no lower extremity edema. Sodium 133, potassium 3.9, chloride 106, bicarb 18, glucose 105, BUN 93, creatinine 11.16, white count 9.6, hemoglobin 7.3,hematocrit 20.4, platelets 62.Urinalysis pH 5.0, protein 100, specific gravity 1.009, RBCs greater than 50, white cells 21-50,urine sodium 33,fecal occult blood positive.CT of the abdomen negative for hydronephrosis or ureteral stone.Chest x-ray with hyperinflation right base scaring (chronic).EKG normal sinus rhythm, normal axis normal intervals.  Patient was admitted to the hospitalwith theworking diagnosis of worsening renal failure,acute kidney injury, complicated of symptomatic anemia, to rule out acute blood loss, due to upper GI bleed.     Assessment & Plan:   Principal Problem:   Acute renal failure superimposed on stage 3 chronic kidney disease (HCC) Active Problems:   Hyperlipidemia   Unspecified Peripheral Vascular Disease   GERD   Abnormal LFTs   BPH (benign prostatic hyperplasia)   Anemia   Gross  hematuria   Dehydration   Symptomatic anemia   COPD not affecting current episode of care ([place])   Hx of pulmonary embolus   Thrombocytopenia (HCC)   Alcohol abuse   Acute renal failure (ARF) (HCC)   AKI (acute kidney injury) ([place])   Melena  #1 acute on chronic kidney disease stage II/III secondary to ATN with non-anion gap metabolic acidosis (baseline creatinine 1.1-1.4) Likely secondary to ATN due to hypovolemia in the setting of ACE inhibitor. On admission renal function was 10.59 which peaked at 13.15 and seems to be plateauing. and currently at 10.88 from 11.62 from 12.46 from 12.43.  Patient with good urine output of 4.780 L over the past 24 hours.  Patient denies any nausea vomiting.  No abdominal pain.  No fatigue.  No change in appetite.  Renal ultrasound negative for hydronephrosis.  Nephrology following and has discontinued patient's PPI.  IV fluids have been increased to 125 cc/h.  Follow for now.   2.  Hypertension Blood Pressure stable.  Continue to hold ACE inhibitor and calcium channel blocker.  On IV fluids.  Follow.    3.  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  Patient currently on IV heparin and Coumadin with goal INR 2-3.  INR currently at 1.47.  Patient will likely be discharged home on Coumadin pending improvement with his renal function will need outpatient follow-up with his PCP to follow his INR and manage his Coumadin.   4.  Shock liver/transaminitis Likely secondary to hypotension and volume depletion.  Improved with hydration.  Follow for now.   5.  Symptomatic anemia/anemia of chronic disease Patient seen in consultation by gastroenterology and upper GI  bleed ruled out by upper endoscopy which was done on 06/15/2017.  Status post 2 units packed red blood cells during his hospitalization.  Hemoglobin stable stable at 8.2.   Outpatient follow-up.   6.  Alcohol abuse Alcohol cessation.  Patient with no signs or symptoms of withdrawal.  Stable.  7.  Thrombocytopenia Likely reactive.  Improving.  Denies any bleeding.     DVT prophylaxis: Heparin/coumadin Code Status: Full Family Communication: Updated patient.  No family at bedside. Disposition Plan: Home once renal function close to baseline and per nephrology.   Consultants:   Nephrology: Dr. Hollie Salk for 24 2019  Gastroenterology: Dr. Hilarie Fredrickson 06/14/2017  Procedures:   Upper endoscopy Dr. Hilarie Fredrickson 06/15/2017  CT abdomen and pelvis 06/13/2017  Chest x-ray 06/13/2017  Renal ultrasound 06/13/2017  2D echo 06/14/2017  Antimicrobials:   None   Subjective: Patient standing up in room denies any shortness of breath or chest pain.  Still with good urinary output.  No abdominal pain.  States gait instability improved.  Denies any bleeding.  Objective: Vitals:   06/21/17 1706 06/21/17 2341 06/22/17 0647 06/22/17 0855  BP: (!) 145/83 140/73 (!) 149/77   Pulse:  67 (!) 57   Resp: 18 20 20    Temp: 98.3 F (36.8 C) 99.5 F (37.5 C) 98.2 F (36.8 C)   TempSrc: Oral Oral Oral   SpO2:  100% 100%   Weight:    74.2 kg (163 lb 9.3 oz)  Height:        Intake/Output Summary (Last 24 hours) at 06/22/2017 1222 Last data filed at 06/22/2017 1100 Gross per 24 hour  Intake 2340.63 ml  Output 2475 ml  Net -134.37 ml   Filed Weights   06/15/17 0423 06/21/17 0553 06/22/17 0855  Weight: 77.4 kg (170 lb 10.2 oz) 74.5 kg (164 lb 3.9 oz) 74.2 kg (163 lb 9.3 oz)    Examination:  General exam: NAD Respiratory system: CTA B.  No crackles, no wheezes, no rhonchi.  Normal respiratory effort.   Cardiovascular system: RRR no murmurs rubs or gallops.  No lower extremity edema.  No JVD.  Gastrointestinal system: Abdomen is nontender, nondistended, soft, positive bowel sounds.  No rebound.  No guarding.  Central nervous system: Alert and oriented. No focal neurological  deficits. Extremities: Symmetric 5 x 5 power. Skin: No rashes, lesions or ulcers Psychiatry: Judgement and insight appear normal. Mood & affect appropriate.     Data Reviewed: I have personally reviewed following labs and imaging studies  CBC: Recent Labs  Lab 06/20/17 0336 06/21/17 0336 06/22/17 0348  WBC 6.5 6.6 7.0  NEUTROABS 3.3  --   --   HGB 8.6* 8.5* 8.2*  HCT 25.1* 26.0* 24.6*  MCV 90.6 91.5 91.1  PLT 160 172 761   Basic Metabolic Panel: Recent Labs  Lab 06/18/17 0410 06/19/17 0409 06/20/17 0336 06/21/17 0336 06/22/17 0348  NA 143 142 143 143 143  K 3.7 3.4* 3.7 3.8 3.8  CL 110 107 111 110 112*  CO2 21* 21* 18* 21* 19*  GLUCOSE 89 79 85 87 84  BUN 91* 83* 84* 77* 74*  CREATININE 13.15* 12.43* 12.46* 11.62* 10.88*  CALCIUM 7.8* 7.6* 7.6* 8.1* 8.2*  PHOS  --   --   --  5.7* 5.7*   GFR: Estimated Creatinine Clearance: 7.2 mL/min (A) (by C-G formula based on SCr of 10.88 mg/dL (H)). Liver Function Tests: Recent Labs  Lab 06/18/17 0410 06/21/17 0336 06/22/17 0348  AST 32  --   --  ALT 101*  --   --   ALKPHOS 54  --   --   BILITOT 1.1  --   --   PROT 6.4*  --   --   ALBUMIN 2.7* 2.7* 2.8*   No results for input(s): LIPASE, AMYLASE in the last 168 hours. No results for input(s): AMMONIA in the last 168 hours. Coagulation Profile: Recent Labs  Lab 06/19/17 1030 06/20/17 0336 06/21/17 0336 06/22/17 0348  INR 1.01 1.13 1.22 1.47   Cardiac Enzymes: No results for input(s): CKTOTAL, CKMB, CKMBINDEX, TROPONINI in the last 168 hours. BNP (last 3 results) No results for input(s): PROBNP in the last 8760 hours. HbA1C: No results for input(s): HGBA1C in the last 72 hours. CBG: No results for input(s): GLUCAP in the last 168 hours. Lipid Profile: No results for input(s): CHOL, HDL, LDLCALC, TRIG, CHOLHDL, LDLDIRECT in the last 72 hours. Thyroid Function Tests: No results for input(s): TSH, T4TOTAL, FREET4, T3FREE, THYROIDAB in the last 72  hours. Anemia Panel: No results for input(s): VITAMINB12, FOLATE, FERRITIN, TIBC, IRON, RETICCTPCT in the last 72 hours. Sepsis Labs: No results for input(s): PROCALCITON, LATICACIDVEN in the last 168 hours.  Recent Results (from the past 240 hour(s))  MRSA PCR Screening     Status: None   Collection Time: 06/14/17  3:30 AM  Result Value Ref Range Status   MRSA by PCR NEGATIVE NEGATIVE Final    Comment:        The GeneXpert MRSA Assay (FDA approved for NASAL specimens only), is one component of a comprehensive MRSA colonization surveillance program. It is not intended to diagnose MRSA infection nor to guide or monitor treatment for MRSA infections. Performed at [facility], [place] [address], [place], [place] [address]          Radiology Studies: No results found.      Scheduled Meds: . multivitamin with minerals  1 tablet Oral Daily  . thiamine  100 mg Oral Daily   Or  . thiamine  100 mg Intravenous Daily  . warfarin  7.5 mg Oral ONCE-1800  . warfarin   Does not apply Once  . Warfarin - Pharmacist Dosing Inpatient   Does not apply q1800   Continuous Infusions: . sodium chloride 75 mL/hr at 06/22/17 0745  . dextrose 5 % and 0.2 % NaCl 125 mL/hr at 06/22/17 1059  . heparin 1,250 Units/hr (06/22/17 0250)     LOS: 9 days    Time spent: 35 minutes    Irine Seal, MD [facility] Pager [phone]  If 7PM-7AM, please contact night-coverage [email or web address] Password [email or web address] 06/22/2017, 12:22 PM

## 2017-06-22 NOTE — Progress Notes (Signed)
[facility] Progress Note  Subjective: creat down 10.8 no new c/o's voiding a lot  Vitals:   06/21/17 1706 06/21/17 2341 06/22/17 0647 06/22/17 0855  BP: (!) 145/83 140/73 (!) 149/77   Pulse:  67 (!) 57   Resp: 18 20 20    Temp: 98.3 F (36.8 C) 99.5 F (37.5 C) 98.2 F (36.8 C)   TempSrc: Oral Oral Oral   SpO2:  100% 100%   Weight:    74.2 kg (163 lb 9.3 oz)  Height:        Inpatient medications: . multivitamin with minerals  1 tablet Oral Daily  . thiamine  100 mg Oral Daily   Or  . thiamine  100 mg Intravenous Daily  . warfarin  7.5 mg Oral ONCE-1800  . warfarin   Does not apply Once  . Warfarin - Pharmacist Dosing Inpatient   Does not apply q1800   . sodium chloride 75 mL/hr at 06/22/17 0745  . dextrose 5 % and 0.2 % NaCl    . heparin 1,250 Units/hr (06/22/17 0250)   acetaminophen **OR** acetaminophen, HYDROcodone-acetaminophen  Exam: Alert, no distress No jvd Chest cta bilat Cor reg no mrg Abd soft ntnd Ext no edema   date  Creat   egfr  2008- 17 1.0- 1.3  2018   1.15 - 2.33 >60  aug 2018 1.80     apr 24  10.59  apr 26  12.12  apr 29 today 13.15  Home meds  - xarelto/ cialis/ crestor/ zetia - norvasc 10/ prinivil qd   ua 4/24 > prot 100 , >50 rbc/ hpf, 21-50 wbc/ hpf, 0-5 epi  repeat ua 4/29 > negative  renal US 4/24 bilat echogenic kidneys, 11cm, normal size no hydro  urine sod 33  Urine creat 94  Impression: 1 acute kidney injury/ aki - severe aki suspected atn due to hypovol/ acei, improving albeit slowy - suspect osmotic diuresis from ^^bun is slowing recovery, will ^ivf's to 125/h - ckd 2/3 underlying 2 ckd II w/ baseline creat 1.1- 1.4 3 etoh abuse 4 anemia/ low plts w gi bleed - sp prbcs 5 ^lfts  6 hypertension holding meds, bp's normal 7 copd  8 hx pulm embolus 2018 9 hx cva 2016  Plan - as above   Kelly Splinter MD [facility] pager [phone]   06/22/2017, 10:17 AM   Recent Labs  Lab  06/20/17 0336 06/21/17 0336 06/22/17 0348  NA 143 143 143  K 3.7 3.8 3.8  CL 111 110 112*  CO2 18* 21* 19*  GLUCOSE 85 87 84  BUN 84* 77* 74*  CREATININE 12.46* 11.62* 10.88*  CALCIUM 7.6* 8.1* 8.2*  PHOS  --  5.7* 5.7*   Recent Labs  Lab 06/18/17 0410 06/21/17 0336 06/22/17 0348  AST 32  --   --   ALT 101*  --   --   ALKPHOS 54  --   --   BILITOT 1.1  --   --   PROT 6.4*  --   --   ALBUMIN 2.7* 2.7* 2.8*   Recent Labs  Lab 06/20/17 0336 06/21/17 0336 06/22/17 0348  WBC 6.5 6.6 7.0  NEUTROABS 3.3  --   --   HGB 8.6* 8.5* 8.2*  HCT 25.1* 26.0* 24.6*  MCV 90.6 91.5 91.1  PLT 160 172 165   Iron/TIBC/Ferritin/ %Sat    Component Value Date/Time   IRON 59 06/13/2017 1921   TIBC 266 06/13/2017 1921   FERRITIN  2,835 (H) 06/13/2017 1921   IRONPCTSAT 22 06/13/2017 1921

## 2017-06-22 NOTE — Progress Notes (Signed)
[place] for Heparin/Warfarin Indication: pulmonary embolus, tx with Xarelto PTA  No Known Allergies  Patient Measurements: Height: 6\' 4"  (193 cm) Weight: 164 lb 3.9 oz (74.5 kg) IBW/kg (Calculated) : 86.8 Heparin Dosing Weight: 77.4kg  Vital Signs: Temp: 98.2 F (36.8 C) (05/03 0647) Temp Source: Oral (05/03 0647) BP: 149/77 (05/03 0647) Pulse Rate: 57 (05/03 0647)  Labs: Recent Labs    06/19/17 1030  06/20/17 0336 06/21/17 0336 06/22/17 0348  HGB  --    < > 8.6* 8.5* 8.2*  HCT  --   --  25.1* 26.0* 24.6*  PLT  --   --  160 172 165  APTT 29  --   --   --   --   LABPROT 13.2  --  14.4 15.3* 17.7*  INR 1.01  --  1.13 1.22 1.47  HEPARINUNFRC  --    < > 0.47 0.49 0.56  CREATININE  --   --  12.46* 11.62* 10.88*   < > = values in this interval not displayed.   Estimated Creatinine Clearance: 7.2 mL/min (A) (by C-G formula based on SCr of 10.88 mg/dL (H)).  Medical History: Past Medical History:  Diagnosis Date  . ABNORMAL ELECTROCARDIOGRAM 11/02/2008  . ABSCESS, LUNG 10/23/2006  . Anemia 05/26/2014  . BACTERIAL PNEUMONIA 12/28/2009  . BPH (benign prostatic hyperplasia) 11/22/2010  . 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 CHEST PAIN 12/24/2009  . Coronary artery calcification seen on CT scan 06/01/2015  . Cramp of limb 07/19/2007  . DIVERTICULOSIS, COLON 03/07/2007  . DVT (deep venous thrombosis) ([employer]) 01/2014   LLE  . Emphysema of lung ([place])   . 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/23/2006  . FLANK PAIN, LEFT 02/25/2010  . FREQUENCY, URINARY 12/24/2009  . HEMORRHOIDS 10/23/2006  . HYPERLIPIDEMIA 10/23/2006  . HYPERTENSION 10/23/2006  . PE (pulmonary embolism) 02/2014  . PLANTAR FASCIITIS, LEFT 11/02/2008  . Pneumonia 12/2013 X 2  . PSA, INCREASED 11/20/2007  . PULMONARY NODULE 05/14/2008  . Unspecified Peripheral Vascular Disease 10/23/2006   Medications:  Scheduled:  . multivitamin with minerals  1 tablet Oral Daily  . thiamine  100 mg Oral Daily   Or  .  thiamine  100 mg Intravenous Daily  . warfarin   Does not apply Once  . Warfarin - Pharmacist Dosing Inpatient   Does not apply q1800   Infusions:  . sodium chloride 75 mL/hr at 06/22/17 0745  . heparin 1,250 Units/hr (06/22/17 0250)    Assessment: 14 yoM with Non-oliguric AKI (baseline SCr 1.1-1.4), likely pre-renal/low BP on Lisinopril  - Pt reports dark vomitus 4 days ago, FOBT PMH: COPD, BPH, CVA, HLD, HTN, DVT/ PE on Xarelto, h/o acute PE in 2018 when stopped AC s/p hemopneumothorax, IVC filter (REMOVED), EtOH abuse, PVD, and CKD 3a  Home dose Xarelto 10mg  daily, last dose 4/24  Significant events: 4/30 Begin Heparin and Warfarin, unable to use Xarelto with severe renal dysfunction  Today, 06/22/2017 HL= 0.56 therapeutic INR 1.47 after 3 doses of warfarin H/H low, stable, Plts WNL Scr 10.88, improving No bleeding or other issues noted  Goal of Therapy:  INR 2-3 Heparin level 0.3-0.7 units/ml Monitor platelets by anticoagulation protocol: Yes   Plan:  Continue heparin drip at 1250 units/hr Warfarin 7.5mg  po at 1800 Daily PT/INR, CBC, HL  Dolly Rias RPh 06/22/2017, 7:53 AM Pager [phone]

## 2017-06-22 NOTE — Progress Notes (Signed)
MD request this CM to check in with PCP office to check on ability to manage coumadin. Per Dr. Cathlean Cower office: they do coumadin lab hours on Wednesday and Friday in the office.  Per Benefits check for Lovenox for home: 1. LOVENOX 80 MG DAILY  COVER- YES  CO-PAY- $116.00  PRIOR APPROVAL- NO   2. ENOXAPARIN 80 MG DAILY  COVER- YES  CO-PAY- $ 5.00  PRIOR APPROVAL- NO    Will alert MD of above information. Marney Doctor RN,BSN,NCM [phone]

## 2017-06-23 LAB — RENAL FUNCTION PANEL
ANION GAP: 15 (ref 5–15)
Albumin: 2.8 g/dL — ABNORMAL LOW (ref 3.5–5.0)
BUN: 73 mg/dL — AB (ref 6–20)
CHLORIDE: 109 mmol/L (ref 101–111)
CO2: 16 mmol/L — ABNORMAL LOW (ref 22–32)
Calcium: 8.5 mg/dL — ABNORMAL LOW (ref 8.9–10.3)
Creatinine, Ser: 10.2 mg/dL — ABNORMAL HIGH (ref 0.61–1.24)
GFR calc Af Amer: 5 mL/min — ABNORMAL LOW (ref >60)
GFR calc non Af Amer: 5 mL/min — ABNORMAL LOW (ref >60)
Glucose, Bld: 90 mg/dL (ref 65–99)
PHOSPHORUS: 5.7 mg/dL — AB (ref 2.5–4.6)
Potassium: 3.8 mmol/L (ref 3.5–5.1)
SODIUM: 140 mmol/L (ref 135–145)

## 2017-06-23 LAB — PROTIME-INR
INR: 1.74
Prothrombin Time: 20.2 seconds — ABNORMAL HIGH (ref 11.4–15.2)

## 2017-06-23 LAB — CBC
HEMATOCRIT: 24.2 % — AB (ref 39.0–52.0)
Hemoglobin: 8.2 g/dL — ABNORMAL LOW (ref 13.0–17.0)
MCH: 30.8 pg (ref 26.0–34.0)
MCHC: 33.9 g/dL (ref 30.0–36.0)
MCV: 91 fL (ref 78.0–100.0)
PLATELETS: 168 10*3/uL (ref 150–400)
RBC: 2.66 MIL/uL — ABNORMAL LOW (ref 4.22–5.81)
RDW: 16.6 % — AB (ref 11.5–15.5)
WBC: 7.6 10*3/uL (ref 4.0–10.5)

## 2017-06-23 LAB — HEPARIN LEVEL (UNFRACTIONATED): Heparin Unfractionated: 0.59 IU/mL (ref 0.30–0.70)

## 2017-06-23 MED ORDER — STERILE WATER FOR INJECTION IV SOLN
INTRAVENOUS | Status: DC
  Administered 2017-06-23 – 2017-06-25 (×5): via INTRAVENOUS
  Filled 2017-06-23 (×5): qty 850

## 2017-06-23 MED ORDER — WARFARIN SODIUM 5 MG PO TABS
7.5000 mg | ORAL_TABLET | Freq: Once | ORAL | Status: AC
  Administered 2017-06-23: 7.5 mg via ORAL
  Filled 2017-06-23: qty 1

## 2017-06-23 MED ORDER — AMLODIPINE BESYLATE 10 MG PO TABS
10.0000 mg | ORAL_TABLET | Freq: Every day | ORAL | Status: DC
  Administered 2017-06-23 – 2017-06-29 (×7): 10 mg via ORAL
  Filled 2017-06-23 (×7): qty 1

## 2017-06-23 NOTE — Progress Notes (Signed)
[place] for Heparin/Warfarin Indication: pulmonary embolus, tx with Xarelto PTA  No Known Allergies  Patient Measurements: Height: 6\' 4"  (193 cm) Weight: 163 lb 9.3 oz (74.2 kg) IBW/kg (Calculated) : 86.8 Heparin Dosing Weight: 77.4kg  Vital Signs: Temp: 98.4 F (36.9 C) (05/04 0531) Temp Source: Oral (05/04 0531) BP: 154/89 (05/04 0531) Pulse Rate: 57 (05/04 0531)  Labs: Recent Labs    06/21/17 0336 06/22/17 0348 06/23/17 0339  HGB 8.5* 8.2* 8.2*  HCT 26.0* 24.6* 24.2*  PLT 172 165 168  LABPROT 15.3* 17.7* 20.2*  INR 1.22 1.47 1.74  HEPARINUNFRC 0.49 0.56 0.59  CREATININE 11.62* 10.88* 10.20*   Estimated Creatinine Clearance: 7.7 mL/min (A) (by C-G formula based on SCr of 10.2 mg/dL (H)).  Medical History: Past Medical History:  Diagnosis Date  . ABNORMAL ELECTROCARDIOGRAM 11/02/2008  . ABSCESS, LUNG 10/23/2006  . Anemia 05/26/2014  . BACTERIAL PNEUMONIA 12/28/2009  . BPH (benign prostatic hyperplasia) 11/22/2010  . 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 CHEST PAIN 12/24/2009  . Coronary artery calcification seen on CT scan 06/01/2015  . Cramp of limb 07/19/2007  . DIVERTICULOSIS, COLON 03/07/2007  . DVT (deep venous thrombosis) ([place]) 01/2014   LLE  . Emphysema of lung ([place])   . 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/23/2006  . FLANK PAIN, LEFT 02/25/2010  . FREQUENCY, URINARY 12/24/2009  . HEMORRHOIDS 10/23/2006  . HYPERLIPIDEMIA 10/23/2006  . HYPERTENSION 10/23/2006  . PE (pulmonary embolism) 02/2014  . PLANTAR FASCIITIS, LEFT 11/02/2008  . Pneumonia 12/2013 X 2  . PSA, INCREASED 11/20/2007  . PULMONARY NODULE 05/14/2008  . Unspecified Peripheral Vascular Disease 10/23/2006   Medications:  Scheduled:  . multivitamin with minerals  1 tablet Oral Daily  . thiamine  100 mg Oral Daily   Or  . thiamine  100 mg Intravenous Daily  . warfarin   Does not apply Once  . Warfarin - Pharmacist Dosing Inpatient   Does not apply q1800   Infusions:  . sodium  chloride Stopped (06/22/17 1100)  . dextrose 5 % and 0.2 % NaCl 125 mL/hr at 06/22/17 1059  . heparin 1,250 Units/hr (06/22/17 2331)   Assessment: 30 yoM with Non-oliguric AKI (baseline SCr 1.1-1.4), likely pre-renal/low BP on Lisinopril  - Pt reports dark vomitus 4 days ago, FOBT PMH: COPD, BPH, CVA, HLD, HTN, DVT/ PE on Xarelto, h/o acute PE in 2018 when stopped AC s/p hemopneumothorax, IVC filter (REMOVED), EtOH abuse, PVD, and CKD 3a  Home dose Xarelto 10mg  daily, last dose 4/24  Significant events: 4/30 Begin Heparin and Warfarin, unable to use Xarelto with severe renal dysfunction  Today, 06/23/2017 HL= 0.59 therapeutic INR 1.74 after 4 doses of warfarin H/H low, stable, Plts WNL Scr 10.20, improving No bleeding or other issues noted  Goal of Therapy:  INR 2-3 Heparin level 0.3-0.7 units/ml Monitor platelets by anticoagulation protocol: Yes   Plan:  Continue heparin drip at 1250 units/hr Warfarin 7.5mg  po at 1800 Daily PT/INR, CBC, HL  Minda Ditto PharmD Pager [phone] 06/23/2017, 9:58 AM

## 2017-06-23 NOTE — Progress Notes (Signed)
[facility] Progress Note  Subjective: creat down 10.2 no new c/o's  Vitals:   06/22/17 0855 06/22/17 1308 06/22/17 2238 06/23/17 0531  BP:  (!) 156/86 (!) 159/92 (!) 154/89  Pulse:  67 61 (!) 57  Resp:  _0 Temp:  97.7 F (36.5 C) 98.6 F (37 C) 98.4 F (36.9 C)  TempSrc:  Oral Oral Oral  SpO2:  100% 100% 100%  Weight: 74.2 kg (163 lb 9.3 oz)   73.4 kg (161 lb 12.8 oz)  Height:        Inpatient medications: . multivitamin with minerals  1 tablet Oral Daily  . thiamine  100 mg Oral Daily   Or  . thiamine  100 mg Intravenous Daily  . warfarin   Does not apply Once  . Warfarin - Pharmacist Dosing Inpatient   Does not apply q1800   . heparin 1,250 Units/hr (06/22/17 2331)  .  sodium bicarbonate (isotonic) infusion in sterile water     acetaminophen **OR** acetaminophen, HYDROcodone-acetaminophen  Exam: Alert, no distress No jvd Chest cta bilat Cor reg no mrg Abd soft ntnd Ext no edema   date  Creat   egfr  2008- 17 1.0- 1.3  2018   1.15 - 2.33 >60  aug 2018 1.80     apr 24  10.59  apr 26  12.12  apr 29 today 13.15  Home meds  - xarelto/ cialis/ crestor/ zetia - norvasc 10/ prinivil qd   ua 4/24 > prot 100 , >50 rbc/ hpf, 21-50 wbc/ hpf, 0-5 epi  repeat ua 4/29 > negative  renal US 4/24 bilat echogenic kidneys, 11cm, normal size no hydro  urine sod 33  Urine creat 94  Impression: 1 acute kidney injury/ aki - severe aki w suspected atn due to hypotension+ hypovol+ acei improving slowly not uremic - suspect osmotic diuresis from ^^bun slowing recovery - cont ivf's for now changed to bicarb gtt today at 100/hr - ckd 3 underlying 2 ckd w baseline creat 1.1- 1.4 3 etoh abuse 4 anemia/ low plts w gi bleed - sp prbcs 5 ^lfts  6 hypertension holding meds, bp's up a bit may resume home meds except avoid acei/ arb for 8 wks 7 copd  8 hx pulm embolus 2018 9 hx cva 2016  Plan - as above   Kelly Splinter MD [facility] pager [phone]   06/23/2017, 9:02 AM   Recent Labs  Lab 06/21/17 0336 06/22/17 0348 06/23/17 0339  NA 143 143 140  K 3.8 3.8 3.8  CL 110 112* 109  CO2 21* 19* 16*  GLUCOSE 87 84 90  BUN 77* 74* 73*  CREATININE 11.62* 10.88* 10.20*  CALCIUM 8.1* 8.2* 8.5*  PHOS 5.7* 5.7* 5.7*   Recent Labs  Lab 06/18/17 0410 06/21/17 0336 06/22/17 0348 06/23/17 0339  AST 32  --   --   --   ALT 101*  --   --   --   ALKPHOS 54  --   --   --   BILITOT 1.1  --   --   --   PROT 6.4*  --   --   --   ALBUMIN 2.7* 2.7* 2.8* 2.8*   Recent Labs  Lab 06/20/17 0336 06/21/17 0336 06/22/17 0348 06/23/17 0339  WBC 6.5 6.6 7.0 7.6  NEUTROABS 3.3  --   --   --   HGB 8.6* 8.5* 8.2* 8.2*  HCT 25.1* 26.0* 24.6* 24.2*  MCV  90.6 91.5 91.1 91.0  PLT 160 172 165 168   Iron/TIBC/Ferritin/ %Sat    Component Value Date/Time   IRON 59 06/13/2017 1921   TIBC 266 06/13/2017 1921   FERRITIN 2,835 (H) 06/13/2017 1921   IRONPCTSAT 22 06/13/2017 1921

## 2017-06-23 NOTE — Progress Notes (Signed)
PROGRESS NOTE    Charles [name]  BPZ:[id number] DOB: 1950/05/25 DOA: 06/13/2017 PCP: Biagio Borg, MD    Brief Narrative:  67 year old male who presented with worsening kidney function. He does have thesignificant past medical history of COPD, BPH, cerebellar CVA, history of DVT/PE status post IVC filter, hypertension, dyslipidemia and history of spontaneous pneumothorax. For the last 5 days prior to hospitalization, patient experienced withdrawal symptoms consistent with nausea, decreased p.o. intake and difficulty ambulating.He was seen by his primary care physician who found him in acute renal failure, hewas referred to the hospital for further evaluation. On the initial physical examination blood pressure 96/60, heart rate 90, respiratory rate 15, temperature 98.3, oxygen saturation 100%. Dry mucous membranes, lungs clear to auscultation bilaterally, heart S1-S2 present rhythmic, the abdomen soft nontender, no lower extremity edema. Sodium 133, potassium 3.9, chloride 106, bicarb 18, glucose 105, BUN 93, creatinine 11.16, white count 9.6, hemoglobin 7.3,hematocrit 20.4, platelets 62.Urinalysis pH 5.0, protein 100, specific gravity 1.009, RBCs greater than 50, white cells 21-50,urine sodium 33,fecal occult blood positive.CT of the abdomen negative for hydronephrosis or ureteral stone.Chest x-ray with hyperinflation right base scaring (chronic).EKG normal sinus rhythm, normal axis normal intervals.  Patient was admitted to the hospitalwith theworking diagnosis of worsening renal failure,acute kidney injury, complicated of symptomatic anemia, to rule out acute blood loss, due to upper GI bleed.     Assessment & Plan:   Principal Problem:   Acute renal failure superimposed on stage 3 chronic kidney disease (HCC) Active Problems:   Hyperlipidemia   Unspecified Peripheral Vascular Disease   GERD   Abnormal LFTs   BPH (benign prostatic hyperplasia)   Anemia   Gross  hematuria   Dehydration   Symptomatic anemia   COPD not affecting current episode of care ([place])   Hx of pulmonary embolus   Thrombocytopenia (HCC)   Alcohol abuse   Acute renal failure (ARF) (HCC)   AKI (acute kidney injury) ([place])   Melena  #1 acute on chronic kidney disease stage II/III secondary to ATN with non-anion gap metabolic acidosis (baseline creatinine 1.1-1.4) Likely secondary to ATN due to hypovolemia in the setting of ACE inhibitor. On admission renal function was 10.59 which peaked at 13.15 and seems to be plateauing. and currently at 10.20 from 10.88 from 11.62 from 12.46 from 12.43.  Patient with good urine output of 2.950 L over the past 24 hours.  Patient denies any nausea vomiting.  No abdominal pain.  No fatigue.  No change in appetite.  Renal ultrasound negative for hydronephrosis.  Nephrology following and has discontinued patient's PPI.  IV fluids have been changed to bicarb drip at 100 cc/h per nephrology.  Nephrology managing.    2.  Hypertension Blood pressure increasing with systolic now in the 242P.  Patient's ACE inhibitor has been discontinued secondary to acute renal failure.  Will resume patient back on his home dose Norvasc.  Follow.    3.  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  Patient currently on IV heparin and Coumadin with goal INR 2-3.  INR currently at 1.74.  Patient will likely be discharged home on Coumadin pending improvement with his renal function will need outpatient follow-up with his PCP to follow his INR and manage his Coumadin.   4.  Shock liver/transaminitis Likely secondary to hypotension and volume depletion. Resolved with hydration.  5.  Symptomatic anemia/anemia of chronic disease Patient seen in consultation by gastroenterology and upper GI bleed ruled out by upper endoscopy which was done on  06/15/2017.  Status post 2 units packed red blood cells during his hospitalization.  Hemoglobin stable stable at 8.2.  Outpatient follow-up.   6.  Alcohol abuse Alcohol cessation.  Patient with no signs or symptoms of withdrawal.  Stable.  7.  Thrombocytopenia Likely reactive.  Improving.  Denies any bleeding.    8 acidosis Likely secondary to acute renal failure.  Patient placed on a bicarb drip.  Per nephrology.   DVT prophylaxis: Heparin/coumadin Code Status: Full Family Communication: Updated patient.  No family at bedside. Disposition Plan: Home once renal function close to baseline and per nephrology.   Consultants:   Nephrology: Dr. Hollie Salk for 24 2019  Gastroenterology: Dr. Hilarie Fredrickson 06/14/2017  Procedures:   Upper endoscopy Dr. Hilarie Fredrickson 06/15/2017  CT abdomen and pelvis 06/13/2017  Chest x-ray 06/13/2017  Renal ultrasound 06/13/2017  2D echo 06/14/2017  Antimicrobials:   None   Subjective: Patient denies chest pain no shortness of breath.  No nausea or vomiting.  Feels better. No bleeding.     Objective: Vitals:   06/22/17 0855 06/22/17 1308 06/22/17 2238 06/23/17 0531  BP:  (!) 156/86 (!) 159/92 (!) 154/89  Pulse:  67 61 (!) 57  Resp:  14 20 20   Temp:  97.7 F (36.5 C) 98.6 F (37 C) 98.4 F (36.9 C)  TempSrc:  Oral Oral Oral  SpO2:  100% 100% 100%  Weight: 74.2 kg (163 lb 9.3 oz)   73.4 kg (161 lb 12.8 oz)  Height:        Intake/Output Summary (Last 24 hours) at 06/23/2017 1242 Last data filed at 06/23/2017 0700 Gross per 24 hour  Intake 3481.46 ml  Output 2650 ml  Net 831.46 ml   Filed Weights   06/21/17 0553 06/22/17 0855 06/23/17 0531  Weight: 74.5 kg (164 lb 3.9 oz) 74.2 kg (163 lb 9.3 oz) 73.4 kg (161 lb 12.8 oz)    Examination:  General exam: NAD Respiratory system: Lungs clear to auscultation bilaterally.  No wheezes, no crackles, no rhonchi.  Normal respiratory effort.   Cardiovascular system: Regular rate and rhythm no murmurs rubs or  gallops.  No JVD.  No lower extremity edema  Gastrointestinal system: Abdomen is soft, nontender, nondistended, positive bowel sounds.  No rebound.  No guarding..  Central nervous system: Alert and oriented. No focal neurological deficits. Extremities: Symmetric 5 x 5 power. Skin: No rashes, lesions or ulcers Psychiatry: Judgement and insight appear normal. Mood & affect appropriate.     Data Reviewed: I have personally reviewed following labs and imaging studies  CBC: Recent Labs  Lab 06/20/17 0336 06/21/17 0336 06/22/17 0348 06/23/17 0339  WBC 6.5 6.6 7.0 7.6  NEUTROABS 3.3  --   --   --   HGB 8.6* 8.5* 8.2* 8.2*  HCT 25.1* 26.0* 24.6* 24.2*  MCV 90.6 91.5 91.1 91.0  PLT 160 172 165 315   Basic Metabolic Panel: Recent Labs  Lab 06/19/17 0409 06/20/17 0336 06/21/17 0336 06/22/17 0348 06/23/17 0339  NA 142 143 143 143 140  K 3.4* 3.7 3.8 3.8 3.8  CL 107 111 110 112* 109  CO2 21* 18* 21* 19* 16*  GLUCOSE 79 85 87 84 90  BUN 83* 84* 77* 74* 73*  CREATININE 12.43* 12.46* 11.62* 10.88* 10.20*  CALCIUM 7.6* 7.6* 8.1* 8.2* 8.5*  PHOS  --   --  5.7* 5.7* 5.7*   GFR: Estimated Creatinine Clearance: 7.6 mL/min (A) (by C-G formula based on SCr of 10.2 mg/dL (H)). Liver Function Tests: Recent Labs  Lab 06/18/17 0410 06/21/17 0336 06/22/17 0348 06/23/17 0339  AST 32  --   --   --   ALT 101*  --   --   --   ALKPHOS 54  --   --   --   BILITOT 1.1  --   --   --   PROT 6.4*  --   --   --   ALBUMIN 2.7* 2.7* 2.8* 2.8*   No results for input(s): LIPASE, AMYLASE in the last 168 hours. No results for input(s): AMMONIA in the last 168 hours. Coagulation Profile: Recent Labs  Lab 06/19/17 1030 06/20/17 0336 06/21/17 0336 06/22/17 0348 06/23/17 0339  INR 1.01 1.13 1.22 1.47 1.74   Cardiac Enzymes: No results for input(s): CKTOTAL, CKMB, CKMBINDEX, TROPONINI in the last 168 hours. BNP (last 3 results) No results for input(s): PROBNP in the last 8760  hours. HbA1C: No results for input(s): HGBA1C in the last 72 hours. CBG: No results for input(s): GLUCAP in the last 168 hours. Lipid Profile: No results for input(s): CHOL, HDL, LDLCALC, TRIG, CHOLHDL, LDLDIRECT in the last 72 hours. Thyroid Function Tests: No results for input(s): TSH, T4TOTAL, FREET4, T3FREE, THYROIDAB in the last 72 hours. Anemia Panel: No results for input(s): VITAMINB12, FOLATE, FERRITIN, TIBC, IRON, RETICCTPCT in the last 72 hours. Sepsis Labs: No results for input(s): PROCALCITON, LATICACIDVEN in the last 168 hours.  Recent Results (from the past 240 hour(s))  MRSA PCR Screening     Status: None   Collection Time: 06/14/17  3:30 AM  Result Value Ref Range Status   MRSA by PCR NEGATIVE NEGATIVE Final    Comment:        The GeneXpert MRSA Assay (FDA approved for NASAL specimens only), is one component of a comprehensive MRSA colonization surveillance program. It is not intended to diagnose MRSA infection nor to guide or monitor treatment for MRSA infections. Performed at [facility], [place] [address] [place], [place] [address]          Radiology Studies: No results found.      Scheduled Meds: . amLODipine  10 mg Oral Daily  . multivitamin with minerals  1 tablet Oral Daily  . thiamine  100 mg Oral Daily   Or  . thiamine  100 mg Intravenous Daily  . warfarin  7.5 mg Oral ONCE-1800  . warfarin   Does not apply Once  . Warfarin - Pharmacist Dosing Inpatient   Does not apply q1800   Continuous Infusions: . heparin 1,250 Units/hr (06/22/17 2331)  .  sodium bicarbonate (isotonic) infusion in sterile water 100 mL/hr at 06/23/17 0910     LOS: 10 days    Time spent: 35 minutes    Irine Seal, MD [facility] Pager [phone]  If 7PM-7AM, please contact night-coverage [email or web address] Password [email or web address] 06/23/2017, 12:42 PM

## 2017-06-24 LAB — RENAL FUNCTION PANEL
Albumin: 2.8 g/dL — ABNORMAL LOW (ref 3.5–5.0)
Anion gap: 12 (ref 5–15)
BUN: 66 mg/dL — AB (ref 6–20)
CHLORIDE: 108 mmol/L (ref 101–111)
CO2: 25 mmol/L (ref 22–32)
Calcium: 8.1 mg/dL — ABNORMAL LOW (ref 8.9–10.3)
Creatinine, Ser: 8.92 mg/dL — ABNORMAL HIGH (ref 0.61–1.24)
GFR calc Af Amer: 6 mL/min — ABNORMAL LOW (ref >60)
GFR calc non Af Amer: 6 mL/min — ABNORMAL LOW (ref >60)
Glucose, Bld: 87 mg/dL (ref 65–99)
Phosphorus: 4.5 mg/dL (ref 2.5–4.6)
Potassium: 3.7 mmol/L (ref 3.5–5.1)
Sodium: 145 mmol/L (ref 135–145)

## 2017-06-24 LAB — CBC
HCT: 23.3 % — ABNORMAL LOW (ref 39.0–52.0)
HEMOGLOBIN: 7.8 g/dL — AB (ref 13.0–17.0)
MCH: 30.4 pg (ref 26.0–34.0)
MCHC: 33.5 g/dL (ref 30.0–36.0)
MCV: 90.7 fL (ref 78.0–100.0)
PLATELETS: 160 10*3/uL (ref 150–400)
RBC: 2.57 MIL/uL — AB (ref 4.22–5.81)
RDW: 16.5 % — AB (ref 11.5–15.5)
WBC: 8.2 10*3/uL (ref 4.0–10.5)

## 2017-06-24 LAB — PROTIME-INR
INR: 1.71
PROTHROMBIN TIME: 19.9 s — AB (ref 11.4–15.2)

## 2017-06-24 LAB — HEPARIN LEVEL (UNFRACTIONATED): HEPARIN UNFRACTIONATED: 0.55 [IU]/mL (ref 0.30–0.70)

## 2017-06-24 MED ORDER — WARFARIN SODIUM 5 MG PO TABS
10.0000 mg | ORAL_TABLET | Freq: Once | ORAL | Status: AC
  Administered 2017-06-24: 10 mg via ORAL
  Filled 2017-06-24: qty 2

## 2017-06-24 MED ORDER — POTASSIUM CHLORIDE CRYS ER 20 MEQ PO TBCR
40.0000 meq | EXTENDED_RELEASE_TABLET | Freq: Two times a day (BID) | ORAL | Status: AC
  Administered 2017-06-24 (×2): 40 meq via ORAL
  Filled 2017-06-24 (×2): qty 2

## 2017-06-24 NOTE — Progress Notes (Signed)
[place] for Heparin/Warfarin Indication: pulmonary embolus, tx with Xarelto PTA  No Known Allergies  Patient Measurements: Height: 6\' 4"  (193 cm) Weight: 163 lb 12.8 oz (74.3 kg) IBW/kg (Calculated) : 86.8 Heparin Dosing Weight: 77.4kg  Vital Signs: Temp: 98.8 F (37.1 C) (05/05 0546) Temp Source: Oral (05/05 0546) BP: 140/80 (05/05 0546) Pulse Rate: 73 (05/05 0546)  Labs: Recent Labs    06/22/17 0348 06/23/17 0339 06/24/17 0349  HGB 8.2* 8.2* 7.8*  HCT 24.6* 24.2* 23.3*  PLT 165 168 160  LABPROT 17.7* 20.2* 19.9*  INR 1.47 1.74 1.71  HEPARINUNFRC 0.56 0.59 0.55  CREATININE 10.88* 10.20* 8.92*   Estimated Creatinine Clearance: 8.8 mL/min (A) (by C-G formula based on SCr of 8.92 mg/dL (H)).  Medical History: Past Medical History:  Diagnosis Date  . ABNORMAL ELECTROCARDIOGRAM 11/02/2008  . ABSCESS, LUNG 10/23/2006  . Anemia 05/26/2014  . BACTERIAL PNEUMONIA 12/28/2009  . BPH (benign prostatic hyperplasia) 11/22/2010  . 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 CHEST PAIN 12/24/2009  . Coronary artery calcification seen on CT scan 06/01/2015  . Cramp of limb 07/19/2007  . DIVERTICULOSIS, COLON 03/07/2007  . DVT (deep venous thrombosis) ([place]) 01/2014   LLE  . Emphysema of lung ([place])   . 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/23/2006  . FLANK PAIN, LEFT 02/25/2010  . FREQUENCY, URINARY 12/24/2009  . HEMORRHOIDS 10/23/2006  . HYPERLIPIDEMIA 10/23/2006  . HYPERTENSION 10/23/2006  . PE (pulmonary embolism) 02/2014  . PLANTAR FASCIITIS, LEFT 11/02/2008  . Pneumonia 12/2013 X 2  . PSA, INCREASED 11/20/2007  . PULMONARY NODULE 05/14/2008  . Unspecified Peripheral Vascular Disease 10/23/2006   Medications:  Scheduled:  . amLODipine  10 mg Oral Daily  . multivitamin with minerals  1 tablet Oral Daily  . thiamine  100 mg Oral Daily   Or  . thiamine  100 mg Intravenous Daily  . warfarin   Does not apply Once  . Warfarin - Pharmacist Dosing Inpatient   Does not apply  q1800   Infusions:  . heparin 1,250 Units/hr (06/23/17 2312)  .  sodium bicarbonate (isotonic) infusion in sterile water 100 mL/hr at 06/24/17 3244   Assessment: 64 yoM with Non-oliguric AKI (baseline SCr 1.1-1.4), likely pre-renal/low BP on Lisinopril  - Pt reported dark vomitus 4 days PTA, FOBT PMH: COPD, BPH, CVA, HLD, HTN, DVT/ PE on Xarelto, h/o acute PE in 2018 when stopped [facility] s/p hemopneumothorax, IVC filter (REMOVED), EtOH abuse, PVD, and CKD 3a  Home dose Xarelto 10mg  daily, last dose 4/24  Significant events: 4/30 Begin Heparin and Warfarin, unable to use Xarelto with severe renal dysfunction  Today, 06/24/2017 HL= 0.55, in therapeutic range INR 1.71 after 5 doses of warfarin H/H low, stable, Plts WNL Scr 8.92, improving No bleeding or other issues noted  Goal of Therapy:  INR 2-3 Heparin level 0.3-0.7 units/ml Monitor platelets by anticoagulation protocol: Yes   Plan:  Continue heparin drip at 1250 units/hr Warfarin 10 mg po at 1200 Daily PT/INR, CBC, HL  Minda Ditto PharmD Pager [phone] 06/24/2017, 7:01 AM

## 2017-06-24 NOTE — Progress Notes (Addendum)
[facility] Progress Note  Subjective: creat down 8.9 today, good uop,  no new c/o's  Vitals:   06/23/17 0531 06/23/17 1312 06/23/17 2016 06/24/17 0546  BP: (!) 154/89 (!) 149/85 128/81 140/80  Pulse: (!) 57 70 74 73  Resp: 20  20 18   Temp: 98.4 F (36.9 C) 98 F (36.7 C) 98.8 F (37.1 C) 98.8 F (37.1 C)  TempSrc: Oral Oral Oral Oral  SpO2: 100% 100% 100% 100%  Weight: 73.4 kg (161 lb 12.8 oz)   74.3 kg (163 lb 12.8 oz)  Height:        Inpatient medications: . amLODipine  10 mg Oral Daily  . multivitamin with minerals  1 tablet Oral Daily  . potassium chloride  40 mEq Oral BID  . thiamine  100 mg Oral Daily   Or  . thiamine  100 mg Intravenous Daily  . warfarin  10 mg Oral Once  . warfarin   Does not apply Once  . Warfarin - Pharmacist Dosing Inpatient   Does not apply q1800   . heparin 1,250 Units/hr (06/23/17 2312)  .  sodium bicarbonate (isotonic) infusion in sterile water 100 mL/hr at 06/24/17 6333   acetaminophen **OR** acetaminophen, HYDROcodone-acetaminophen  Exam: Alert, no distress No jvd Chest cta bilat Cor reg no mrg Abd soft ntnd Ext no edema   date  Creat   egfr  2008- 17 1.0- 1.3  2018   1.15 - 2.33 >60  aug 2018 1.80     apr 24  10.59  apr 26  12.12  apr 29 today 13.15  Home meds  - xarelto/ cialis/ crestor/ zetia - norvasc 10/ prinivil qd   ua 4/24 > prot 100 , >50 rbc/ hpf, 21-50 wbc/ hpf, 0-5 epi  repeat ua 4/29 > negative  renal US 4/24 bilat echogenic kidneys, 11cm, normal size no hydro  urine sod 33  Urine creat 94  Impression: 1 acute kidney injury/ aki - severe aki w suspected atn due to hypotension+ hypovol+ acei improving now creat peaked 13 now down to 8.9 cont supportive care, no acei/ arb for 2-3 months - cont ivf's bicarb gtt decrease to 75/ hr - ckd 3 underlying 2 ckd w baseline creat 1.1- 1.4 3 etoh abuse 4 anemia/ low plts w gi bleed - sp prbcs 5 ^lfts  6 hypertension bp's up and resumed home  norvasc , cont to avoid acei/ arb as above 7 copd  8 hx pulm embolus 2018 9 hx cva 2016  Plan - as above   Kelly Splinter MD [facility] pager [phone]   06/24/2017, 8:43 AM   Recent Labs  Lab 06/22/17 0348 06/23/17 0339 06/24/17 0349  NA 143 140 145  K 3.8 3.8 3.7  CL 112* 109 108  CO2 19* 16* 25  GLUCOSE 84 90 87  BUN 74* 73* 66*  CREATININE 10.88* 10.20* 8.92*  CALCIUM 8.2* 8.5* 8.1*  PHOS 5.7* 5.7* 4.5   Recent Labs  Lab 06/18/17 0410  06/22/17 0348 06/23/17 0339 06/24/17 0349  AST 32  --   --   --   --   ALT 101*  --   --   --   --   ALKPHOS 54  --   --   --   --   BILITOT 1.1  --   --   --   --   PROT 6.4*  --   --   --   --   ALBUMIN  2.7*   < > 2.8* 2.8* 2.8*   < > = values in this interval not displayed.   Recent Labs  Lab 06/20/17 0336  06/22/17 0348 06/23/17 0339 06/24/17 0349  WBC 6.5   < > 7.0 7.6 8.2  NEUTROABS 3.3  --   --   --   --   HGB 8.6*   < > 8.2* 8.2* 7.8*  HCT 25.1*   < > 24.6* 24.2* 23.3*  MCV 90.6   < > 91.1 91.0 90.7  PLT 160   < > 165 168 160   < > = values in this interval not displayed.   Iron/TIBC/Ferritin/ %Sat    Component Value Date/Time   IRON 59 06/13/2017 1921   TIBC 266 06/13/2017 1921   FERRITIN 2,835 (H) 06/13/2017 1921   IRONPCTSAT 22 06/13/2017 1921

## 2017-06-24 NOTE — Progress Notes (Signed)
PROGRESS NOTE    Charles [name]  UDJ:[id number] DOB: 08-09-1950 DOA: 06/13/2017 PCP: Biagio Borg, MD    Brief Narrative:  67 year old male who presented with worsening kidney function. He does have thesignificant past medical history of COPD, BPH, cerebellar CVA, history of DVT/PE status post IVC filter, hypertension, dyslipidemia and history of spontaneous pneumothorax. For the last 5 days prior to hospitalization, patient experienced withdrawal symptoms consistent with nausea, decreased p.o. intake and difficulty ambulating.He was seen by his primary care physician who found him in acute renal failure, hewas referred to the hospital for further evaluation. On the initial physical examination blood pressure 96/60, heart rate 90, respiratory rate 15, temperature 98.3, oxygen saturation 100%. Dry mucous membranes, lungs clear to auscultation bilaterally, heart S1-S2 present rhythmic, the abdomen soft nontender, no lower extremity edema. Sodium 133, potassium 3.9, chloride 106, bicarb 18, glucose 105, BUN 93, creatinine 11.16, white count 9.6, hemoglobin 7.3,hematocrit 20.4, platelets 62.Urinalysis pH 5.0, protein 100, specific gravity 1.009, RBCs greater than 50, white cells 21-50,urine sodium 33,fecal occult blood positive.CT of the abdomen negative for hydronephrosis or ureteral stone.Chest x-ray with hyperinflation right base scaring (chronic).EKG normal sinus rhythm, normal axis normal intervals.  Patient was admitted to the hospitalwith theworking diagnosis of worsening renal failure,acute kidney injury, complicated of symptomatic anemia, to rule out acute blood loss, due to upper GI bleed.     Assessment & Plan:   Principal Problem:   Acute renal failure superimposed on stage 3 chronic kidney disease (HCC) Active Problems:   Hyperlipidemia   Unspecified Peripheral Vascular Disease   GERD   Abnormal LFTs   BPH (benign prostatic hyperplasia)   Anemia   Gross  hematuria   Dehydration   Symptomatic anemia   COPD not affecting current episode of care ([place])   Hx of pulmonary embolus   Thrombocytopenia (HCC)   Alcohol abuse   Acute renal failure (ARF) (HCC)   AKI (acute kidney injury) ([place])   Melena  #1 acute on chronic kidney disease stage II/III secondary to ATN with non-anion gap metabolic acidosis (baseline creatinine 1.1-1.4) Likely secondary to ATN due to hypovolemia in the setting of ACE inhibitor. On admission renal function was 10.59 which peaked at 13.15 and seems to be plateauing and started to trend back down.  Creatinine at 8.92 from 10.20 from 10.88 from 11.62 from 12.46 from 12.43.  Patient with good urine output of 2.650 L over the past 24 hours.  Patient denies any nausea vomiting.  No abdominal pain.  No fatigue.  No change in appetite.  Renal ultrasound negative for hydronephrosis.  Nephrology following and has discontinued patient's PPI.  IV fluids have been changed to bicarb drip at 100 cc/h per nephrology.  Nephrology managing.    2.  Hypertension Blood pressure improved with resumption of patient's Norvasc.  ACE inhibitor discontinued secondary to acute renal failure.  Will likely discontinue ACE inhibitor on discharge.  3.  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  Patient currently on IV heparin and Coumadin with goal INR 2-3.  INR currently at 1.71.  Patient will likely be discharged home on Coumadin pending improvement with his renal function will need outpatient follow-up with his PCP to follow his INR and manage his Coumadin.   4.  Shock liver/transaminitis Likely secondary to hypotension and volume depletion. Resolved with hydration.   5.  Symptomatic anemia/anemia of chronic disease Patient seen in consultation by gastroenterology and upper GI bleed ruled out by upper endoscopy which  was done on 06/15/2017.  Status post 2 units packed red blood cells during his hospitalization.  Hemoglobin stable at 7.8.  Monitor with hydration.   6.  Alcohol abuse No signs or symptoms of withdrawal.  Stable.   7.  Thrombocytopenia Likely reactive.  Improving.   8 acidosis Likely secondary to acute renal failure.  Improving on bicarb drip.  Per nephrology.    DVT prophylaxis: Heparin/coumadin Code Status: Full Family Communication: Updated patient.  No family at bedside. Disposition Plan: Home once renal function close to baseline and per nephrology.   Consultants:   Nephrology: Dr. Hollie Salk for 24 2019  Gastroenterology: Dr. Hilarie Fredrickson 06/14/2017  Procedures:   Upper endoscopy Dr. Hilarie Fredrickson 06/15/2017  CT abdomen and pelvis 06/13/2017  Chest x-ray 06/13/2017  Renal ultrasound 06/13/2017  2D echo 06/14/2017  Antimicrobials:   None   Subjective: Patient in bed.  No shortness of breath.  No chest pain.  No nausea or vomiting.  Good urine output.  Denies any bleeding.       Objective: Vitals:   06/23/17 0531 06/23/17 1312 06/23/17 2016 06/24/17 0546  BP: (!) 154/89 (!) 149/85 128/81 140/80  Pulse: (!) 57 70 74 73  Resp: 20  20 18   Temp: 98.4 F (36.9 C) 98 F (36.7 C) 98.8 F (37.1 C) 98.8 F (37.1 C)  TempSrc: Oral Oral Oral Oral  SpO2: 100% 100% 100% 100%  Weight: 73.4 kg (161 lb 12.8 oz)   74.3 kg (163 lb 12.8 oz)  Height:        Intake/Output Summary (Last 24 hours) at 06/24/2017 1140 Last data filed at 06/24/2017 0839 Gross per 24 hour  Intake 2024.79 ml  Output 3150 ml  Net -1125.21 ml   Filed Weights   06/22/17 0855 06/23/17 0531 06/24/17 0546  Weight: 74.2 kg (163 lb 9.3 oz) 73.4 kg (161 lb 12.8 oz) 74.3 kg (163 lb 12.8 oz)    Examination:  General exam: NAD Respiratory system: CTAB. Normal respiratory effort.   Cardiovascular system: RRR.  No lower extremity edema.  No JVD.  Gastrointestinal system: Abdomen is nontender, nondistended, soft, positive  bowel sounds.   Central nervous system: Alert and oriented. No focal neurological deficits. Extremities: Symmetric 5 x 5 power. Skin: No rashes, lesions or ulcers Psychiatry: Judgement and insight appear normal. Mood & affect appropriate.     Data Reviewed: I have personally reviewed following labs and imaging studies  CBC: Recent Labs  Lab 06/20/17 0336 06/21/17 0336 06/22/17 0348 06/23/17 0339 06/24/17 0349  WBC 6.5 6.6 7.0 7.6 8.2  NEUTROABS 3.3  --   --   --   --   HGB 8.6* 8.5* 8.2* 8.2* 7.8*  HCT 25.1* 26.0* 24.6* 24.2* 23.3*  MCV 90.6 91.5 91.1 91.0 90.7  PLT 160 172 165 168 253   Basic Metabolic Panel: Recent Labs  Lab 06/20/17 0336 06/21/17 0336 06/22/17 0348 06/23/17 0339 06/24/17 0349  NA 143 143 143 140 145  K 3.7 3.8 3.8 3.8 3.7  CL 111 110 112* 109 108  CO2 18* 21* 19* 16* 25  GLUCOSE 85 87 84 90 87  BUN 84* 77* 74* 73* 66*  CREATININE 12.46* 11.62* 10.88* 10.20* 8.92*  CALCIUM 7.6* 8.1* 8.2* 8.5* 8.1*  PHOS  --  5.7* 5.7* 5.7* 4.5   GFR: Estimated Creatinine Clearance: 8.8 mL/min (A) (by C-G formula  based on SCr of 8.92 mg/dL (H)). Liver Function Tests: Recent Labs  Lab 06/18/17 0410 06/21/17 0336 06/22/17 0348 06/23/17 0339 06/24/17 0349  AST 32  --   --   --   --   ALT 101*  --   --   --   --   ALKPHOS 54  --   --   --   --   BILITOT 1.1  --   --   --   --   PROT 6.4*  --   --   --   --   ALBUMIN 2.7* 2.7* 2.8* 2.8* 2.8*   No results for input(s): LIPASE, AMYLASE in the last 168 hours. No results for input(s): AMMONIA in the last 168 hours. Coagulation Profile: Recent Labs  Lab 06/20/17 0336 06/21/17 0336 06/22/17 0348 06/23/17 0339 06/24/17 0349  INR 1.13 1.22 1.47 1.74 1.71   Cardiac Enzymes: No results for input(s): CKTOTAL, CKMB, CKMBINDEX, TROPONINI in the last 168 hours. BNP (last 3 results) No results for input(s): PROBNP in the last 8760 hours. HbA1C: No results for input(s): HGBA1C in the last 72 hours. CBG: No  results for input(s): GLUCAP in the last 168 hours. Lipid Profile: No results for input(s): CHOL, HDL, LDLCALC, TRIG, CHOLHDL, LDLDIRECT in the last 72 hours. Thyroid Function Tests: No results for input(s): TSH, T4TOTAL, FREET4, T3FREE, THYROIDAB in the last 72 hours. Anemia Panel: No results for input(s): VITAMINB12, FOLATE, FERRITIN, TIBC, IRON, RETICCTPCT in the last 72 hours. Sepsis Labs: No results for input(s): PROCALCITON, LATICACIDVEN in the last 168 hours.  No results found for this or any previous visit (from the past 240 hour(s)).       Radiology Studies: No results found.      Scheduled Meds: . amLODipine  10 mg Oral Daily  . multivitamin with minerals  1 tablet Oral Daily  . potassium chloride  40 mEq Oral BID  . thiamine  100 mg Oral Daily   Or  . thiamine  100 mg Intravenous Daily  . warfarin  10 mg Oral Once  . warfarin   Does not apply Once  . Warfarin - Pharmacist Dosing Inpatient   Does not apply q1800   Continuous Infusions: . heparin 1,250 Units/hr (06/23/17 2312)  .  sodium bicarbonate (isotonic) infusion in sterile water 100 mL/hr at 06/24/17 0527     LOS: 11 days    Time spent: 35 minutes    Irine Seal, MD [facility] Pager [phone]  If 7PM-7AM, please contact night-coverage [email or web address] Password [email or web address] 06/24/2017, 11:40 AM

## 2017-06-25 LAB — CBC
HEMATOCRIT: 22.6 % — AB (ref 39.0–52.0)
HEMOGLOBIN: 7.6 g/dL — AB (ref 13.0–17.0)
MCH: 30.8 pg (ref 26.0–34.0)
MCHC: 33.6 g/dL (ref 30.0–36.0)
MCV: 91.5 fL (ref 78.0–100.0)
Platelets: 177 10*3/uL (ref 150–400)
RBC: 2.47 MIL/uL — ABNORMAL LOW (ref 4.22–5.81)
RDW: 16.2 % — ABNORMAL HIGH (ref 11.5–15.5)
WBC: 8.1 10*3/uL (ref 4.0–10.5)

## 2017-06-25 LAB — RENAL FUNCTION PANEL
ALBUMIN: 2.8 g/dL — AB (ref 3.5–5.0)
ANION GAP: 14 (ref 5–15)
BUN: 63 mg/dL — ABNORMAL HIGH (ref 6–20)
CALCIUM: 8.3 mg/dL — AB (ref 8.9–10.3)
CO2: 28 mmol/L (ref 22–32)
Chloride: 103 mmol/L (ref 101–111)
Creatinine, Ser: 8.38 mg/dL — ABNORMAL HIGH (ref 0.61–1.24)
GFR, EST AFRICAN AMERICAN: 7 mL/min — AB (ref >60)
GFR, EST NON AFRICAN AMERICAN: 6 mL/min — AB (ref >60)
Glucose, Bld: 85 mg/dL (ref 65–99)
PHOSPHORUS: 3.2 mg/dL (ref 2.5–4.6)
Potassium: 4.2 mmol/L (ref 3.5–5.1)
SODIUM: 145 mmol/L (ref 135–145)

## 2017-06-25 LAB — HEPARIN LEVEL (UNFRACTIONATED): Heparin Unfractionated: 0.55 IU/mL (ref 0.30–0.70)

## 2017-06-25 LAB — PROTIME-INR
INR: 2.04
Prothrombin Time: 22.9 seconds — ABNORMAL HIGH (ref 11.4–15.2)

## 2017-06-25 MED ORDER — WARFARIN SODIUM 5 MG PO TABS
7.5000 mg | ORAL_TABLET | Freq: Once | ORAL | Status: AC
  Administered 2017-06-25: 7.5 mg via ORAL
  Filled 2017-06-25: qty 1

## 2017-06-25 MED ORDER — SODIUM CHLORIDE 0.9 % IV SOLN
INTRAVENOUS | Status: DC
  Administered 2017-06-25 – 2017-06-26 (×2): via INTRAVENOUS

## 2017-06-25 MED ORDER — ZOLPIDEM TARTRATE 5 MG PO TABS
2.5000 mg | ORAL_TABLET | Freq: Once | ORAL | Status: AC
  Administered 2017-06-25: 2.5 mg via ORAL
  Filled 2017-06-25: qty 1

## 2017-06-25 NOTE — Progress Notes (Signed)
PROGRESS NOTE    Charles [name]  HOZ:[id number] DOB: 1950/07/27 DOA: 06/13/2017 PCP: Biagio Borg, MD    Brief Narrative:  67 year old male who presented with worsening kidney function. He does have thesignificant past medical history of COPD, BPH, cerebellar CVA, history of DVT/PE status post IVC filter, hypertension, dyslipidemia and history of spontaneous pneumothorax. For the last 5 days prior to hospitalization, patient experienced withdrawal symptoms consistent with nausea, decreased p.o. intake and difficulty ambulating.He was seen by his primary care physician who found him in acute renal failure, hewas referred to the hospital for further evaluation. On the initial physical examination blood pressure 96/60, heart rate 90, respiratory rate 15, temperature 98.3, oxygen saturation 100%. Dry mucous membranes, lungs clear to auscultation bilaterally, heart S1-S2 present rhythmic, the abdomen soft nontender, no lower extremity edema. Sodium 133, potassium 3.9, chloride 106, bicarb 18, glucose 105, BUN 93, creatinine 11.16, white count 9.6, hemoglobin 7.3,hematocrit 20.4, platelets 62.Urinalysis pH 5.0, protein 100, specific gravity 1.009, RBCs greater than 50, white cells 21-50,urine sodium 33,fecal occult blood positive.CT of the abdomen negative for hydronephrosis or ureteral stone.Chest x-ray with hyperinflation right base scaring (chronic).EKG normal sinus rhythm, normal axis normal intervals.  Patient was admitted to the hospitalwith theworking diagnosis of worsening renal failure,acute kidney injury, complicated of symptomatic anemia, to rule out acute blood loss, due to upper GI bleed.     Assessment & Plan:   Principal Problem:   Acute renal failure superimposed on stage 3 chronic kidney disease (HCC) Active Problems:   Hyperlipidemia   Unspecified Peripheral Vascular Disease   GERD   Abnormal LFTs   BPH (benign prostatic hyperplasia)   Anemia   Gross  hematuria   Dehydration   Symptomatic anemia   COPD not affecting current episode of care ([place])   Hx of pulmonary embolus   Thrombocytopenia (HCC)   Alcohol abuse   Acute renal failure (ARF) (HCC)   AKI (acute kidney injury) ([place])   Melena  #1 acute on chronic kidney disease stage II/III secondary to ATN with non-anion gap metabolic acidosis (baseline creatinine 1.1-1.4) Likely secondary to ATN due to hypovolemia in the setting of ACE inhibitor. On admission renal function was 10.59 which peaked at 13.15 and seems to have plateaued and started to trend back down.  Creatinine at 8.38 from 8.92 from 10.20 from 10.88 from 11.62 from 12.46 from 12.43.  Patient with good urine output of 3.6 L over the past 24 hours.  Patient denies any nausea vomiting.  No abdominal pain.  No fatigue.  No change in appetite.  Renal ultrasound negative for hydronephrosis.  Nephrology following and has discontinued patient's PPI.  Patient was on a bicarb drip.  IV fluids being changed by nephrology.  Bicarb drip to be discontinued.   Nephrology managing.    2.  Hypertension Blood pressure improved with resumption of patient's Norvasc.  ACE inhibitor discontinued secondary to acute renal failure.  Will likely discontinue ACE inhibitor on discharge.  3.  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  Patient currently on IV heparin and Coumadin with goal INR 2-3.  INR currently at 2.04.  Discontinue heparin.  Patient will likely be discharged home on Coumadin pending improvement with his renal function will need outpatient follow-up with his PCP to follow his INR and manage his Coumadin.   4.  Shock liver/transaminitis Likely secondary  to hypotension and volume depletion. Resolved with hydration.   5.  Symptomatic anemia/anemia of chronic disease Patient seen in consultation by  gastroenterology and upper GI bleed ruled out by upper endoscopy which was done on 06/15/2017.  Status post 2 units packed red blood cells during his hospitalization.  Hemoglobin stable at 7.6.  Monitor with hydration.   6.  Alcohol abuse No signs or symptoms of withdrawal.  Stable.   7.  Thrombocytopenia Likely reactive.  Improved.    8 acidosis Likely secondary to acute renal failure.  Resolved on bicarb drip.  Bicarb drip discontinued and IV fluids have been paged per nephrology.  Follow.     DVT prophylaxis: coumadin Code Status: Full Family Communication: Updated patient.  No family at bedside. Disposition Plan: Home once renal function close to baseline and per nephrology.   Consultants:   Nephrology: Dr. Hollie Salk for 24 2019  Gastroenterology: Dr. Hilarie Fredrickson 06/14/2017  Procedures:   Upper endoscopy Dr. Hilarie Fredrickson 06/15/2017  CT abdomen and pelvis 06/13/2017  Chest x-ray 06/13/2017  Renal ultrasound 06/13/2017  2D echo 06/14/2017  Antimicrobials:   None   Subjective: Patient denies any chest pain.  No shortness of breath.  No nausea or vomiting.  Still with good urine output.      Objective: Vitals:   06/24/17 0546 06/24/17 1316 06/24/17 2006 06/25/17 0549  BP: 140/80 129/87 (!) 142/83 127/88  Pulse: 73 75 76 80  Resp: 18  20 18   Temp: 98.8 F (37.1 C) 99.5 F (37.5 C) 98.3 F (36.8 C) 98.8 F (37.1 C)  TempSrc: Oral Oral Oral Oral  SpO2: 100% 97% 100% 100%  Weight: 74.3 kg (163 lb 12.8 oz)   73 kg (160 lb 15 oz)  Height:        Intake/Output Summary (Last 24 hours) at 06/25/2017 1203 Last data filed at 06/25/2017 0800 Gross per 24 hour  Intake -  Output 3050 ml  Net -3050 ml   Filed Weights   06/23/17 0531 06/24/17 0546 06/25/17 0549  Weight: 73.4 kg (161 lb 12.8 oz) 74.3 kg (163 lb 12.8 oz) 73 kg (160 lb 15 oz)    Examination:  General exam: NAD Respiratory system: Lungs clear to auscultation bilaterally.  No wheezes, no crackles, no rhonchi.    Cardiovascular system: Regular rate and rhythm no murmurs rubs or gallops.  No lower extremity edema.  No JVD. Gastrointestinal system: Abdomen is soft, nontender, nondistended, positive bowel sounds. Central nervous system: Alert and oriented. No focal neurological deficits. Extremities: Symmetric 5 x 5 power. Skin: No rashes, lesions or ulcers Psychiatry: Judgement and insight appear normal. Mood & affect appropriate.     Data Reviewed: I have personally reviewed following labs and imaging studies  CBC: Recent Labs  Lab 06/20/17 0336 06/21/17 0336 06/22/17 0348 06/23/17 0339 06/24/17 0349 06/25/17 0415  WBC 6.5 6.6 7.0 7.6 8.2 8.1  NEUTROABS 3.3  --   --   --   --   --   HGB 8.6* 8.5* 8.2* 8.2* 7.8* 7.6*  HCT 25.1* 26.0* 24.6* 24.2* 23.3* 22.6*  MCV 90.6 91.5 91.1 91.0 90.7 91.5  PLT 160 172 165 168 160 858   Basic Metabolic Panel: Recent Labs  Lab 06/21/17 0336 06/22/17 0348 06/23/17 0339 06/24/17 0349 06/25/17 0415  NA 143 143 140 145 145  K 3.8 3.8 3.8 3.7 4.2  CL 110 112* 109 108 103  CO2 21* 19* 16* 25 28  GLUCOSE 87 84 90 87 85  BUN  77* 74* 73* 66* 63*  CREATININE 11.62* 10.88* 10.20* 8.92* 8.38*  CALCIUM 8.1* 8.2* 8.5* 8.1* 8.3*  PHOS 5.7* 5.7* 5.7* 4.5 3.2   GFR: Estimated Creatinine Clearance: 9.2 mL/min (A) (by C-G formula based on SCr of 8.38 mg/dL (H)). Liver Function Tests: Recent Labs  Lab 06/21/17 0336 06/22/17 0348 06/23/17 0339 06/24/17 0349 06/25/17 0415  ALBUMIN 2.7* 2.8* 2.8* 2.8* 2.8*   No results for input(s): LIPASE, AMYLASE in the last 168 hours. No results for input(s): AMMONIA in the last 168 hours. Coagulation Profile: Recent Labs  Lab 06/21/17 0336 06/22/17 0348 06/23/17 0339 06/24/17 0349 06/25/17 0415  INR 1.22 1.47 1.74 1.71 2.04   Cardiac Enzymes: No results for input(s): CKTOTAL, CKMB, CKMBINDEX, TROPONINI in the last 168 hours. BNP (last 3 results) No results for input(s): PROBNP in the last 8760  hours. HbA1C: No results for input(s): HGBA1C in the last 72 hours. CBG: No results for input(s): GLUCAP in the last 168 hours. Lipid Profile: No results for input(s): CHOL, HDL, LDLCALC, TRIG, CHOLHDL, LDLDIRECT in the last 72 hours. Thyroid Function Tests: No results for input(s): TSH, T4TOTAL, FREET4, T3FREE, THYROIDAB in the last 72 hours. Anemia Panel: No results for input(s): VITAMINB12, FOLATE, FERRITIN, TIBC, IRON, RETICCTPCT in the last 72 hours. Sepsis Labs: No results for input(s): PROCALCITON, LATICACIDVEN in the last 168 hours.  No results found for this or any previous visit (from the past 240 hour(s)).       Radiology Studies: No results found.      Scheduled Meds: . amLODipine  10 mg Oral Daily  . multivitamin with minerals  1 tablet Oral Daily  . thiamine  100 mg Oral Daily   Or  . thiamine  100 mg Intravenous Daily  . warfarin  7.5 mg Oral ONCE-1800  . warfarin   Does not apply Once  . Warfarin - Pharmacist Dosing Inpatient   Does not apply q1800   Continuous Infusions: . heparin 1,250 Units/hr (06/24/17 2053)  .  sodium bicarbonate (isotonic) infusion in sterile water 75 mL/hr at 06/25/17 0710     LOS: 12 days    Time spent: 35 minutes    Irine Seal, MD [facility] Pager [phone]  If 7PM-7AM, please contact night-coverage [email or web address] Password [email or web address] 06/25/2017, 12:03 PM

## 2017-06-25 NOTE — Progress Notes (Signed)
S:Feels well, no complaints O:BP 124/85 (BP Location: Left Arm)   Pulse 85   Temp 98.5 F (36.9 C) (Oral)   Resp 18   Ht 6\' 4"  (1.93 m)   Wt 73 kg (160 lb 15 oz)   SpO2 100%   BMI 19.59 kg/m   Intake/Output Summary (Last 24 hours) at 06/25/2017 1310 Last data filed at 06/25/2017 0800 Gross per 24 hour  Intake -  Output 2450 ml  Net -2450 ml   Intake/Output: I/O last 3 completed shifts: In: 1316.5 [P.O.:1140; I.V.:176.5] Out: 5800 [Urine:5800]  Intake/Output this shift:  Total I/O In: -  Out: 150 [Urine:150] Weight change: -1.3 kg (-2 lb 13.9 oz) Gen: NAD CVS: no rub Resp: cta Abd: benign Ext: no edema  Recent Labs  Lab 06/19/17 0409 06/20/17 0336 06/21/17 0336 06/22/17 0348 06/23/17 0339 06/24/17 0349 06/25/17 0415  NA 142 143 143 143 140 145 145  K 3.4* 3.7 3.8 3.8 3.8 3.7 4.2  CL 107 111 110 112* 109 108 103  CO2 21* 18* 21* 19* 16* 25 28  GLUCOSE 79 85 87 84 90 87 85  BUN 83* 84* 77* 74* 73* 66* 63*  CREATININE 12.43* 12.46* 11.62* 10.88* 10.20* 8.92* 8.38*  ALBUMIN  --   --  2.7* 2.8* 2.8* 2.8* 2.8*  CALCIUM 7.6* 7.6* 8.1* 8.2* 8.5* 8.1* 8.3*  PHOS  --   --  5.7* 5.7* 5.7* 4.5 3.2   Liver Function Tests: Recent Labs  Lab 06/23/17 0339 06/24/17 0349 06/25/17 0415  ALBUMIN 2.8* 2.8* 2.8*   No results for input(s): LIPASE, AMYLASE in the last 168 hours. No results for input(s): AMMONIA in the last 168 hours. CBC: Recent Labs  Lab 06/20/17 0336 06/21/17 0336 06/22/17 0348 06/23/17 0339 06/24/17 0349 06/25/17 0415  WBC 6.5 6.6 7.0 7.6 8.2 8.1  NEUTROABS 3.3  --   --   --   --   --   HGB 8.6* 8.5* 8.2* 8.2* 7.8* 7.6*  HCT 25.1* 26.0* 24.6* 24.2* 23.3* 22.6*  MCV 90.6 91.5 91.1 91.0 90.7 91.5  PLT 160 172 165 168 160 177   Cardiac Enzymes: No results for input(s): CKTOTAL, CKMB, CKMBINDEX, TROPONINI in the last 168 hours. CBG: No results for input(s): GLUCAP in the last 168 hours.  Iron Studies: No results for input(s): IRON, TIBC,  TRANSFERRIN, FERRITIN in the last 72 hours. Studies/Results: No results found. Marland Kitchen amLODipine  10 mg Oral Daily  . multivitamin with minerals  1 tablet Oral Daily  . thiamine  100 mg Oral Daily   Or  . thiamine  100 mg Intravenous Daily  . warfarin  7.5 mg Oral ONCE-1800  . warfarin   Does not apply Once  . Warfarin - Pharmacist Dosing Inpatient   Does not apply q1800    BMET    Component Value Date/Time   NA 145 06/25/2017 0415   K 4.2 06/25/2017 0415   CL 103 06/25/2017 0415   CO2 28 06/25/2017 0415   GLUCOSE 85 06/25/2017 0415   BUN 63 (H) 06/25/2017 0415   CREATININE 8.38 (H) 06/25/2017 0415   CALCIUM 8.3 (L) 06/25/2017 0415   GFRNONAA 6 (L) 06/25/2017 0415   GFRAA 7 (L) 06/25/2017 0415   CBC    Component Value Date/Time   WBC 8.1 06/25/2017 0415   RBC 2.47 (L) 06/25/2017 0415   HGB 7.6 (L) 06/25/2017 0415   HCT 22.6 (L) 06/25/2017 0415   PLT 177 06/25/2017 0415   MCV  91.5 06/25/2017 0415   MCH 30.8 06/25/2017 0415   MCHC 33.6 06/25/2017 0415   RDW 16.2 (H) 06/25/2017 0415   LYMPHSABS 2.2 06/20/2017 0336   MONOABS 0.8 06/20/2017 0336   EOSABS 0.1 06/20/2017 0336   BASOSABS 0.1 06/20/2017 0336    Assessment/Plan:  1. AKI/CKD stage 3- acute onset with marked increase in Scr and history of worsening lower extremity edema, dizziness, and unsteady gait.  Also with new onset of anemia also noted to have some microscopic hematuria and subnephrotic proteinuria.  Presumably due to ischemic atn in setting of relative hypotension/hypovolemia with concomitant ACE-inhibition, however will need to r/o myeloma given presentation (SPEP was negative for M spike, will send UPEP).  Renal US ordered 06/13/17 without obstruction.  Will start GN workup as well. Negative HIV hepB and Hep C.  Renal function continues to slowly improve.  2. Metabolic acidosis- improved with IV bicarb will change to IV NS and follow 3. Symptomatic anemia- EGD on 06/15/17 without evidence of GIB.  As above r/o  myeloma s/p transfusion of 2 units PRBC's.  4. Abnormal LFT's consistent with shock liver per GI 5. H/o etoh abuse, recent cessation 6. COPD- stable 7. HTN- stable, avoid ACE/ARB for now 8. Hypoalbuminemia- possibly related to liver disease and r/o myeloma as above 9. H/o pulmonary embolus 2018 on xarelto 10. H/o CVA  Donetta Potts, MD [employer] [phone]

## 2017-06-25 NOTE — Progress Notes (Signed)
ANTICOAGULATION CONSULT NOTE  Pharmacy Consult for Heparin/Warfarin Indication: hx DVT and pulmonary embolus, on xarelto PTA  No Known Allergies  Patient Measurements: Height: 6\' 4"  (193 cm) Weight: 160 lb 15 oz (73 kg) IBW/kg (Calculated) : 86.8 Heparin Dosing Weight: 77.4kg  Vital Signs: Temp: 98.8 F (37.1 C) (05/06 0549) Temp Source: Oral (05/06 0549) BP: 127/88 (05/06 0549) Pulse Rate: 80 (05/06 0549)  Labs: Recent Labs    06/23/17 0339 06/24/17 0349 06/25/17 0415  HGB 8.2* 7.8* 7.6*  HCT 24.2* 23.3* 22.6*  PLT 168 160 177  LABPROT 20.2* 19.9* 22.9*  INR 1.74 1.71 2.04  HEPARINUNFRC 0.59 0.55 0.55  CREATININE 10.20* 8.92* 8.38*   Estimated Creatinine Clearance: 9.2 mL/min (A) (by C-G formula based on SCr of 8.38 mg/dL (H)).  Medical History: Past Medical History:  Diagnosis Date  . ABNORMAL ELECTROCARDIOGRAM 11/02/2008  . ABSCESS, LUNG 10/23/2006  . Anemia 05/26/2014  . BACTERIAL PNEUMONIA 12/28/2009  . BPH (benign prostatic hyperplasia) 11/22/2010  . 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 CHEST PAIN 12/24/2009  . Coronary artery calcification seen on CT scan 06/01/2015  . Cramp of limb 07/19/2007  . DIVERTICULOSIS, COLON 03/07/2007  . DVT (deep venous thrombosis) ([place]) 01/2014   LLE  . Emphysema of lung ([place])   . 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/23/2006  . FLANK PAIN, LEFT 02/25/2010  . FREQUENCY, URINARY 12/24/2009  . HEMORRHOIDS 10/23/2006  . HYPERLIPIDEMIA 10/23/2006  . HYPERTENSION 10/23/2006  . PE (pulmonary embolism) 02/2014  . PLANTAR FASCIITIS, LEFT 11/02/2008  . Pneumonia 12/2013 X 2  . PSA, INCREASED 11/20/2007  . PULMONARY NODULE 05/14/2008  . Unspecified Peripheral Vascular Disease 10/23/2006   Medications:  Scheduled:  . amLODipine  10 mg Oral Daily  . multivitamin with minerals  1 tablet Oral Daily  . thiamine  100 mg Oral Daily   Or  . thiamine  100 mg Intravenous Daily  . warfarin   Does not apply Once  . Warfarin - Pharmacist Dosing Inpatient   Does not apply  q1800   Infusions:  . heparin 1,250 Units/hr (06/24/17 2053)  .  sodium bicarbonate (isotonic) infusion in sterile water 75 mL/hr at 06/25/17 0710   Assessment: Patient is a 67 y.o M with hx DVT/PE on xarelto 10 mg daily PTA, presented to the ED on 06/13/17 for workup of renal insufficiency.  Patient was subsequently transitioned to heparin and warfarin inpatient.  Significant events: 4/26 upper GI endoscopy: no signif. findings 4/30 Begin Heparin and Warfarin, unable to use Xarelto with severe renal dysfunction  Today, 06/25/2017: - heparin level is therapeutic at 0.55 - INR is therapeutic at 2.04 - hgb low but somewhat stable, plts stable - no bleeding documented - no significant drug-drug intxns - scr 8.38 (crcl<10)  Goal of Therapy:  INR 2-3 Heparin level 0.3-0.7 units/ml Monitor platelets by anticoagulation protocol: Yes   Plan:  - Continue heparin drip at 1250 units/hr - Warfarin 7.5 mg PO x1 - consider d/c heparin drip since INR is therapeutic - monitor for s/s bleeding  Dia Sitter, PharmD, BCPS 06/25/2017 8:09 AM

## 2017-06-26 LAB — RENAL FUNCTION PANEL
ALBUMIN: 2.9 g/dL — AB (ref 3.5–5.0)
ANION GAP: 12 (ref 5–15)
BUN: 58 mg/dL — ABNORMAL HIGH (ref 6–20)
CALCIUM: 8.7 mg/dL — AB (ref 8.9–10.3)
CO2: 26 mmol/L (ref 22–32)
Chloride: 107 mmol/L (ref 101–111)
Creatinine, Ser: 7.59 mg/dL — ABNORMAL HIGH (ref 0.61–1.24)
GFR, EST AFRICAN AMERICAN: 8 mL/min — AB (ref >60)
GFR, EST NON AFRICAN AMERICAN: 7 mL/min — AB (ref >60)
Glucose, Bld: 85 mg/dL (ref 65–99)
PHOSPHORUS: 3.8 mg/dL (ref 2.5–4.6)
Potassium: 4.2 mmol/L (ref 3.5–5.1)
SODIUM: 145 mmol/L (ref 135–145)

## 2017-06-26 LAB — CBC
HCT: 22.8 % — ABNORMAL LOW (ref 39.0–52.0)
HEMOGLOBIN: 7.6 g/dL — AB (ref 13.0–17.0)
MCH: 30.9 pg (ref 26.0–34.0)
MCHC: 33.3 g/dL (ref 30.0–36.0)
MCV: 92.7 fL (ref 78.0–100.0)
Platelets: 165 10*3/uL (ref 150–400)
RBC: 2.46 MIL/uL — AB (ref 4.22–5.81)
RDW: 16.4 % — ABNORMAL HIGH (ref 11.5–15.5)
WBC: 8 10*3/uL (ref 4.0–10.5)

## 2017-06-26 LAB — C3 COMPLEMENT: C3 Complement: 131 mg/dL (ref 82–167)

## 2017-06-26 LAB — C4 COMPLEMENT: Complement C4, Body Fluid: 34 mg/dL (ref 14–44)

## 2017-06-26 LAB — PROTIME-INR
INR: 1.85
Prothrombin Time: 21.2 seconds — ABNORMAL HIGH (ref 11.4–15.2)

## 2017-06-26 LAB — COMPLEMENT, TOTAL

## 2017-06-26 LAB — MPO/PR-3 (ANCA) ANTIBODIES

## 2017-06-26 LAB — KAPPA/LAMBDA LIGHT CHAINS
Kappa free light chain: 175.5 mg/L — ABNORMAL HIGH (ref 3.3–19.4)
Kappa, lambda light chain ratio: 1.72 — ABNORMAL HIGH (ref 0.26–1.65)
LAMDA FREE LIGHT CHAINS: 102.1 mg/L — AB (ref 5.7–26.3)

## 2017-06-26 LAB — ANTINUCLEAR ANTIBODIES, IFA: ANTINUCLEAR ANTIBODIES, IFA: NEGATIVE

## 2017-06-26 LAB — ANTI-DNA ANTIBODY, DOUBLE-STRANDED: ds DNA Ab: 4 IU/mL (ref 0–9)

## 2017-06-26 LAB — ANTISTREPTOLYSIN O TITER: ASO: 56 IU/mL (ref 0.0–200.0)

## 2017-06-26 MED ORDER — ZOLPIDEM TARTRATE 5 MG PO TABS
5.0000 mg | ORAL_TABLET | Freq: Every evening | ORAL | Status: DC | PRN
  Administered 2017-06-26 – 2017-06-28 (×3): 5 mg via ORAL
  Filled 2017-06-26 (×3): qty 1

## 2017-06-26 MED ORDER — WARFARIN SODIUM 5 MG PO TABS
10.0000 mg | ORAL_TABLET | Freq: Once | ORAL | Status: AC
  Administered 2017-06-26: 10 mg via ORAL
  Filled 2017-06-26: qty 2

## 2017-06-26 NOTE — Progress Notes (Signed)
S: Feels better O:BP 135/83 (BP Location: Left Arm)   Pulse 86   Temp 98.1 F (36.7 C) (Oral)   Resp 20   Ht 6\' 4"  (1.93 m)   Wt 73.8 kg (162 lb 11.2 oz)   SpO2 99%   BMI 19.80 kg/m   Intake/Output Summary (Last 24 hours) at 06/26/2017 1229 Last data filed at 06/26/2017 0945 Gross per 24 hour  Intake 2486.25 ml  Output 2725 ml  Net -238.75 ml   Intake/Output: I/O last 3 completed shifts: In: 2246.3 [P.O.:1200; I.V.:1046.3] Out: 5100 [Urine:5100]  Intake/Output this shift:  Total I/O In: 240 [P.O.:240] Out: -  Weight change: 0.8 kg (1 lb 12.2 oz) Gen: NAD CVS: no rub Resp: cta Abd: +BS, soft, NT/ND Ext: no edema  Recent Labs  Lab 06/20/17 0336 06/21/17 0336 06/22/17 0348 06/23/17 0339 06/24/17 0349 06/25/17 0415 06/26/17 0405  NA 143 143 143 140 145 145 145  K 3.7 3.8 3.8 3.8 3.7 4.2 4.2  CL 111 110 112* 109 108 103 107  CO2 18* 21* 19* 16* 25 28 26   GLUCOSE 85 87 84 90 87 85 85  BUN 84* 77* 74* 73* 66* 63* 58*  CREATININE 12.46* 11.62* 10.88* 10.20* 8.92* 8.38* 7.59*  ALBUMIN  --  2.7* 2.8* 2.8* 2.8* 2.8* 2.9*  CALCIUM 7.6* 8.1* 8.2* 8.5* 8.1* 8.3* 8.7*  PHOS  --  5.7* 5.7* 5.7* 4.5 3.2 3.8   Liver Function Tests: Recent Labs  Lab 06/24/17 0349 06/25/17 0415 06/26/17 0405  ALBUMIN 2.8* 2.8* 2.9*   No results for input(s): LIPASE, AMYLASE in the last 168 hours. No results for input(s): AMMONIA in the last 168 hours. CBC: Recent Labs  Lab 06/20/17 0336  06/22/17 0348 06/23/17 0339 06/24/17 0349 06/25/17 0415 06/26/17 0405  WBC 6.5   < > 7.0 7.6 8.2 8.1 8.0  NEUTROABS 3.3  --   --   --   --   --   --   HGB 8.6*   < > 8.2* 8.2* 7.8* 7.6* 7.6*  HCT 25.1*   < > 24.6* 24.2* 23.3* 22.6* 22.8*  MCV 90.6   < > 91.1 91.0 90.7 91.5 92.7  PLT 160   < > 165 168 160 177 165   < > = values in this interval not displayed.   Cardiac Enzymes: No results for input(s): CKTOTAL, CKMB, CKMBINDEX, TROPONINI in the last 168 hours. CBG: No results for input(s):  GLUCAP in the last 168 hours.  Iron Studies: No results for input(s): IRON, TIBC, TRANSFERRIN, FERRITIN in the last 72 hours. Studies/Results: No results found. Marland Kitchen amLODipine  10 mg Oral Daily  . multivitamin with minerals  1 tablet Oral Daily  . thiamine  100 mg Oral Daily   Or  . thiamine  100 mg Intravenous Daily  . warfarin  10 mg Oral ONCE-1800  . warfarin   Does not apply Once  . Warfarin - Pharmacist Dosing Inpatient   Does not apply q1800    BMET    Component Value Date/Time   NA 145 06/26/2017 0405   K 4.2 06/26/2017 0405   CL 107 06/26/2017 0405   CO2 26 06/26/2017 0405   GLUCOSE 85 06/26/2017 0405   BUN 58 (H) 06/26/2017 0405   CREATININE 7.59 (H) 06/26/2017 0405   CALCIUM 8.7 (L) 06/26/2017 0405   GFRNONAA 7 (L) 06/26/2017 0405   GFRAA 8 (L) 06/26/2017 0405   CBC    Component Value Date/Time   WBC  8.0 06/26/2017 0405   RBC 2.46 (L) 06/26/2017 0405   HGB 7.6 (L) 06/26/2017 0405   HCT 22.8 (L) 06/26/2017 0405   PLT 165 06/26/2017 0405   MCV 92.7 06/26/2017 0405   MCH 30.9 06/26/2017 0405   MCHC 33.3 06/26/2017 0405   RDW 16.4 (H) 06/26/2017 0405   LYMPHSABS 2.2 06/20/2017 0336   MONOABS 0.8 06/20/2017 0336   EOSABS 0.1 06/20/2017 0336   BASOSABS 0.1 06/20/2017 0336    Assessment/Plan:  1. AKI/CKD stage 3- acute onset with marked increase in Scr and history of worsening lower extremity edema, dizziness, and unsteady gait.  Also with new onset of anemia also noted to have some microscopic hematuria and subnephrotic proteinuria.  Presumably due to ischemic atn in setting of relative hypotension/hypovolemia with concomitant ACE-inhibition, however will need to r/o myeloma given presentation (SPEP was negative for M spike, UPEP pending).  Renal US ordered 06/13/17 without obstruction.  GN workup started. Negative HIV hepB and Hep C.   1. Renal function continues to slowly improve. 2. Will stop IVF's and see if improvement continues as he may be able to go home  in the next 24 hours and f/u with Dr. Florene Glen in our office as an outpatient and follow labs closely  2. Metabolic acidosis- improved with IV bicarb will change to IV NS and follow 3. Symptomatic anemia- EGD on 06/15/17 without evidence of GIB.  As above r/o myeloma s/p transfusion of 2 units PRBC's.  4. Abnormal LFT's consistent with shock liver per GI.  Need to recheck in am.  5. H/o etoh abuse, recent cessation 6. COPD- stable 7. HTN- stable, avoid ACE/ARB for now 8. Hypoalbuminemia- possibly related to liver disease and r/o myeloma as above 9. H/o pulmonary embolus 2018 was on xarelto now on coumadin 10. H/o CVA   Donetta Potts, MD [employer] [phone]

## 2017-06-26 NOTE — Progress Notes (Signed)
[facility] for Warfarin Indication: hx DVT and pulmonary embolus, on xarelto PTA  No Known Allergies  Patient Measurements: Height: 6\' 4"  (193 cm) Weight: 162 lb 11.2 oz (73.8 kg) IBW/kg (Calculated) : 86.8 Heparin Dosing Weight: 77.4kg  Vital Signs: Temp: 98.1 F (36.7 C) (05/07 0532) Temp Source: Oral (05/07 0532) BP: 135/83 (05/07 0532) Pulse Rate: 86 (05/07 0532)  Labs: Recent Labs    06/24/17 0349 06/25/17 0415 06/26/17 0405  HGB 7.8* 7.6* 7.6*  HCT 23.3* 22.6* 22.8*  PLT 160 177 165  LABPROT 19.9* 22.9* 21.2*  INR 1.71 2.04 1.85  HEPARINUNFRC 0.55 0.55  --   CREATININE 8.92* 8.38* 7.59*   Estimated Creatinine Clearance: 10.3 mL/min (A) (by C-G formula based on SCr of 7.59 mg/dL (H)).  Medical History: Past Medical History:  Diagnosis Date  . ABNORMAL ELECTROCARDIOGRAM 11/02/2008  . ABSCESS, LUNG 10/23/2006  . Anemia 05/26/2014  . BACTERIAL PNEUMONIA 12/28/2009  . BPH (benign prostatic hyperplasia) 11/22/2010  . 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 CHEST PAIN 12/24/2009  . Coronary artery calcification seen on CT scan 06/01/2015  . Cramp of limb 07/19/2007  . DIVERTICULOSIS, COLON 03/07/2007  . DVT (deep venous thrombosis) ([place]) 01/2014   LLE  . Emphysema of lung ([place])   . 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/23/2006  . FLANK PAIN, LEFT 02/25/2010  . FREQUENCY, URINARY 12/24/2009  . HEMORRHOIDS 10/23/2006  . HYPERLIPIDEMIA 10/23/2006  . HYPERTENSION 10/23/2006  . PE (pulmonary embolism) 02/2014  . PLANTAR FASCIITIS, LEFT 11/02/2008  . Pneumonia 12/2013 X 2  . PSA, INCREASED 11/20/2007  . PULMONARY NODULE 05/14/2008  . Unspecified Peripheral Vascular Disease 10/23/2006   Medications:  Scheduled:  . amLODipine  10 mg Oral Daily  . multivitamin with minerals  1 tablet Oral Daily  . thiamine  100 mg Oral Daily   Or  . thiamine  100 mg Intravenous Daily  . warfarin   Does not apply Once  . Warfarin - Pharmacist Dosing Inpatient   Does not apply q1800    Infusions:  . sodium chloride 75 mL/hr at 06/26/17 0223   Assessment: Patient is a 67 y.o M with hx DVT/PE on xarelto 10 mg daily PTA, presented to the ED on 06/13/17 for workup of renal insufficiency.  Patient was subsequently transitioned to heparin and warfarin inpatient.  Significant events: 4/26 upper GI endoscopy: no signif. findings 4/30 Begin Heparin and Warfarin, unable to use Xarelto with severe renal dysfunction 5/6 heparin stopped  Today, 06/26/2017: - INR is subtherapeutic at 1.85 - hgb low but stable, plts stable - no bleeding documented - no significant drug-drug intxns - scr 7.59 (crcl 10)  Goal of Therapy:  INR 2-3 Heparin level 0.3-0.7 units/ml Monitor platelets by anticoagulation protocol: Yes   Plan:  - Warfarin 10 mg PO x1 - Daily INR  - monitor for s/s bleeding   Royetta Asal, PharmD, BCPS Pager [phone] 06/26/2017 9:59 AM

## 2017-06-26 NOTE — Progress Notes (Signed)
PROGRESS NOTE    Charles [name]  VPX:[id number] DOB: 09-04-1950 DOA: 06/13/2017 PCP: Biagio Borg, MD    Brief Narrative:  67 year old male who presented with worsening kidney function. He does have thesignificant past medical history of COPD, BPH, cerebellar CVA, history of DVT/PE status post IVC filter, hypertension, dyslipidemia and history of spontaneous pneumothorax. For the last 5 days prior to hospitalization, patient experienced withdrawal symptoms consistent with nausea, decreased p.o. intake and difficulty ambulating.He was seen by his primary care physician who found him in acute renal failure, hewas referred to the hospital for further evaluation. On the initial physical examination blood pressure 96/60, heart rate 90, respiratory rate 15, temperature 98.3, oxygen saturation 100%. Dry mucous membranes, lungs clear to auscultation bilaterally, heart S1-S2 present rhythmic, the abdomen soft nontender, no lower extremity edema. Sodium 133, potassium 3.9, chloride 106, bicarb 18, glucose 105, BUN 93, creatinine 11.16, white count 9.6, hemoglobin 7.3,hematocrit 20.4, platelets 62.Urinalysis pH 5.0, protein 100, specific gravity 1.009, RBCs greater than 50, white cells 21-50,urine sodium 33,fecal occult blood positive.CT of the abdomen negative for hydronephrosis or ureteral stone.Chest x-ray with hyperinflation right base scaring (chronic).EKG normal sinus rhythm, normal axis normal intervals.  Patient was admitted to the hospitalwith theworking diagnosis of worsening renal failure,acute kidney injury, complicated of symptomatic anemia, to rule out acute blood loss, due to upper GI bleed.     Assessment & Plan:   Principal Problem:   Acute renal failure superimposed on stage 3 chronic kidney disease (HCC) Active Problems:   Hyperlipidemia   Unspecified Peripheral Vascular Disease   GERD   Abnormal LFTs   BPH (benign prostatic hyperplasia)   Anemia   Gross  hematuria   Dehydration   Symptomatic anemia   COPD not affecting current episode of care ([place])   Hx of pulmonary embolus   Thrombocytopenia (HCC)   Alcohol abuse   Acute renal failure (ARF) (HCC)   AKI (acute kidney injury) ([place])   Melena  #1 acute on chronic kidney disease stage II/III secondary to ATN with non-anion gap metabolic acidosis (baseline creatinine 1.1-1.4) Likely secondary to ATN due to hypovolemia in the setting of ACE inhibitor. On admission renal function was 10.59 which peaked at 13.15 and seems to have plateaued and started to trend back down.  Creatinine at 7.59 from 8.38 from 8.92 from 10.20 from 10.88 from 11.62 from 12.46 from 12.43.  Patient with good urine output of 3.2 L over the past 24 hours.  Patient denies any nausea vomiting.  No abdominal pain.  No fatigue.  No change in appetite.  Renal ultrasound negative for hydronephrosis.  Nephrology following and has discontinued patient's PPI.  Patient was on a bicarb drip which is subsequently been discontinued.  IV fluids have been saline locked per nephrology today follow renal function.  Nephrology managing.    2.  Hypertension Blood pressure improved with resumption of patient's Norvasc.  ACE inhibitor discontinued secondary to acute renal failure.  Would not resume ACE inhibitor on discharge.   3.  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  Patient currently on IV heparin and Coumadin with goal INR 2-3.  INR currently at 1.85 from 2.04.  Heparin has been discontinued.  Patient will likely be discharged home on Coumadin pending improvement with his renal function will need outpatient follow-up with his PCP to follow his INR and  manage his Coumadin.   4.  Shock liver/transaminitis Likely secondary to hypotension and volume depletion. Resolved with hydration.   5.  Symptomatic  anemia/anemia of chronic disease Patient seen in consultation by gastroenterology and upper GI bleed ruled out by upper endoscopy which was done on 06/15/2017.  Status post 2 units packed red blood cells during his hospitalization.  SPEP which was done was negative for M spike.  UPEP ordered and is pending per nephrology.    6.  Alcohol abuse No signs or symptoms of withdrawal.  Stable.   7.  Thrombocytopenia Likely reactive.  Improved.    8 acidosis Likely secondary to acute renal failure.  Resolved on bicarb drip.  Bicarb drip discontinued and IV fluids now has been saline locked per nephrology.     DVT prophylaxis: coumadin Code Status: Full Family Communication: Updated patient.  No family at bedside. Disposition Plan: Home once renal function close to baseline and per nephrology hopefully in the next 24 to 48 hours..   Consultants:   Nephrology: Dr. Hollie Salk for 24 2019  Gastroenterology: Dr. Hilarie Fredrickson 06/14/2017  Procedures:   Upper endoscopy Dr. Hilarie Fredrickson 06/15/2017  CT abdomen and pelvis 06/13/2017  Chest x-ray 06/13/2017  Renal ultrasound 06/13/2017  2D echo 06/14/2017  Antimicrobials:   None   Subjective: Patient sleeping easily arousable.  No nausea or vomiting.  No shortness of breath.  No chest pain.  Good urine output.      Objective: Vitals:   06/25/17 1257 06/25/17 2053 06/26/17 0531 06/26/17 0532  BP: 124/85 (!) 145/86  135/83  Pulse: 85 76  86  Resp:  20  20  Temp: 98.5 F (36.9 C) 98.9 F (37.2 C)  98.1 F (36.7 C)  TempSrc: Oral Oral  Oral  SpO2: 100% 100%  99%  Weight:   73.8 kg (162 lb 11.2 oz)   Height:        Intake/Output Summary (Last 24 hours) at 06/26/2017 1246 Last data filed at 06/26/2017 0945 Gross per 24 hour  Intake 2486.25 ml  Output 2725 ml  Net -238.75 ml   Filed Weights   06/24/17 0546 06/25/17 0549 06/26/17 0531  Weight: 74.3 kg (163 lb 12.8 oz) 73 kg (160 lb 15 oz) 73.8 kg (162 lb 11.2 oz)    Examination:  General exam:  NAD Respiratory system: CTAB.  No wheezes, no crackles, no rhonchi.  Cardiovascular system: RRR no murmurs rubs or gallops.  No JVD. Gastrointestinal system: Abdomen is nontender, nondistended, soft, positive bowel sounds.  Central nervous system: Alert and oriented. No focal neurological deficits. Extremities: Symmetric 5 x 5 power. Skin: No rashes, lesions or ulcers Psychiatry: Judgement and insight appear normal. Mood & affect appropriate.     Data Reviewed: I have personally reviewed following labs and imaging studies  CBC: Recent Labs  Lab 06/20/17 0336  06/22/17 0348 06/23/17 0339 06/24/17 0349 06/25/17 0415 06/26/17 0405  WBC 6.5   < > 7.0 7.6 8.2 8.1 8.0  NEUTROABS 3.3  --   --   --   --   --   --   HGB 8.6*   < > 8.2* 8.2* 7.8* 7.6* 7.6*  HCT 25.1*   < > 24.6* 24.2* 23.3* 22.6* 22.8*  MCV 90.6   < > 91.1 91.0 90.7 91.5 92.7  PLT 160   < > 165 168 160 177 165   < > = values in this interval not displayed.   Basic Metabolic Panel: Recent Labs  Lab 06/22/17  1610 06/23/17 0339 06/24/17 0349 06/25/17 0415 06/26/17 0405  NA 143 140 145 145 145  K 3.8 3.8 3.7 4.2 4.2  CL 112* 109 108 103 107  CO2 19* 16* 25 28 26   GLUCOSE 84 90 87 85 85  BUN 74* 73* 66* 63* 58*  CREATININE 10.88* 10.20* 8.92* 8.38* 7.59*  CALCIUM 8.2* 8.5* 8.1* 8.3* 8.7*  PHOS 5.7* 5.7* 4.5 3.2 3.8   GFR: Estimated Creatinine Clearance: 10.3 mL/min (A) (by C-G formula based on SCr of 7.59 mg/dL (H)). Liver Function Tests: Recent Labs  Lab 06/22/17 0348 06/23/17 0339 06/24/17 0349 06/25/17 0415 06/26/17 0405  ALBUMIN 2.8* 2.8* 2.8* 2.8* 2.9*   No results for input(s): LIPASE, AMYLASE in the last 168 hours. No results for input(s): AMMONIA in the last 168 hours. Coagulation Profile: Recent Labs  Lab 06/22/17 0348 06/23/17 0339 06/24/17 0349 06/25/17 0415 06/26/17 0405  INR 1.47 1.74 1.71 2.04 1.85   Cardiac Enzymes: No results for input(s): CKTOTAL, CKMB, CKMBINDEX, TROPONINI  in the last 168 hours. BNP (last 3 results) No results for input(s): PROBNP in the last 8760 hours. HbA1C: No results for input(s): HGBA1C in the last 72 hours. CBG: No results for input(s): GLUCAP in the last 168 hours. Lipid Profile: No results for input(s): CHOL, HDL, LDLCALC, TRIG, CHOLHDL, LDLDIRECT in the last 72 hours. Thyroid Function Tests: No results for input(s): TSH, T4TOTAL, FREET4, T3FREE, THYROIDAB in the last 72 hours. Anemia Panel: No results for input(s): VITAMINB12, FOLATE, FERRITIN, TIBC, IRON, RETICCTPCT in the last 72 hours. Sepsis Labs: No results for input(s): PROCALCITON, LATICACIDVEN in the last 168 hours.  No results found for this or any previous visit (from the past 240 hour(s)).       Radiology Studies: No results found.      Scheduled Meds: . amLODipine  10 mg Oral Daily  . multivitamin with minerals  1 tablet Oral Daily  . thiamine  100 mg Oral Daily   Or  . thiamine  100 mg Intravenous Daily  . warfarin  10 mg Oral ONCE-1800  . warfarin   Does not apply Once  . Warfarin - Pharmacist Dosing Inpatient   Does not apply q1800   Continuous Infusions: . sodium chloride 10 mL/hr at 06/26/17 1236     LOS: 13 days    Time spent: 35 minutes    Irine Seal, MD [facility] Pager [phone]  If 7PM-7AM, please contact night-coverage [email or web address] Password [facility] 06/26/2017, 12:46 PM

## 2017-06-27 DIAGNOSIS — N170 Acute kidney failure with tubular necrosis: Principal | ICD-10-CM

## 2017-06-27 LAB — PROTIME-INR
INR: 1.72
PROTHROMBIN TIME: 20 s — AB (ref 11.4–15.2)

## 2017-06-27 LAB — CBC
HEMATOCRIT: 23.6 % — AB (ref 39.0–52.0)
HEMOGLOBIN: 7.9 g/dL — AB (ref 13.0–17.0)
MCH: 30.9 pg (ref 26.0–34.0)
MCHC: 33.5 g/dL (ref 30.0–36.0)
MCV: 92.2 fL (ref 78.0–100.0)
Platelets: 185 10*3/uL (ref 150–400)
RBC: 2.56 MIL/uL — ABNORMAL LOW (ref 4.22–5.81)
RDW: 16.4 % — AB (ref 11.5–15.5)
WBC: 8.7 10*3/uL (ref 4.0–10.5)

## 2017-06-27 LAB — RENAL FUNCTION PANEL
ALBUMIN: 3 g/dL — AB (ref 3.5–5.0)
Anion gap: 10 (ref 5–15)
BUN: 58 mg/dL — AB (ref 6–20)
CALCIUM: 8.5 mg/dL — AB (ref 8.9–10.3)
CO2: 23 mmol/L (ref 22–32)
Chloride: 107 mmol/L (ref 101–111)
Creatinine, Ser: 7.43 mg/dL — ABNORMAL HIGH (ref 0.61–1.24)
GFR calc Af Amer: 8 mL/min — ABNORMAL LOW (ref >60)
GFR, EST NON AFRICAN AMERICAN: 7 mL/min — AB (ref >60)
GLUCOSE: 84 mg/dL (ref 65–99)
PHOSPHORUS: 4.1 mg/dL (ref 2.5–4.6)
POTASSIUM: 4.1 mmol/L (ref 3.5–5.1)
SODIUM: 140 mmol/L (ref 135–145)

## 2017-06-27 LAB — HEPATIC FUNCTION PANEL
ALT: 17 U/L (ref 17–63)
AST: 19 U/L (ref 15–41)
Albumin: 3.1 g/dL — ABNORMAL LOW (ref 3.5–5.0)
Alkaline Phosphatase: 37 U/L — ABNORMAL LOW (ref 38–126)
BILIRUBIN DIRECT: 0.1 mg/dL (ref 0.1–0.5)
BILIRUBIN INDIRECT: 0.6 mg/dL (ref 0.3–0.9)
TOTAL PROTEIN: 6.8 g/dL (ref 6.5–8.1)
Total Bilirubin: 0.7 mg/dL (ref 0.3–1.2)

## 2017-06-27 LAB — IMMUNOFIXATION, URINE

## 2017-06-27 LAB — GLOMERULAR BASEMENT MEMBRANE ANTIBODIES: GBM Ab: 3 units (ref 0–20)

## 2017-06-27 MED ORDER — WARFARIN SODIUM 5 MG PO TABS
10.0000 mg | ORAL_TABLET | Freq: Once | ORAL | Status: AC
  Administered 2017-06-27: 10 mg via ORAL
  Filled 2017-06-27: qty 2

## 2017-06-27 NOTE — Progress Notes (Signed)
[facility] PROGRESS NOTE    Progress Note  Charles [name]  HQI:[id number] DOB: 1950-09-26 DOA: 06/13/2017 PCP: Charles Borg, MD     Brief Narrative:   Charles [name] is an 67 y.o. 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  Went to see his primary care doctor who found him in acute renal failure in 7 to the ED, his creatinine on admission was 11.1, with a bicarb of 18 and a potassium of 3.9 white count of 9.6 hemoglobin of 7.3 UA showed greater than 50 red blood cells and 21-50 white blood cells urinary sodium was 33 PT was positive, CT scan of the abdomen without contrast was negative for hydronephrosis or stone  Assessment/Plan:   ATN on stage 3 chronic kidney disease (Charles [name]) Based on creatinine of 1.1.4. ATN is likely due to hypotension in the setting of ACE inhibitor use, on admission his creatinine was 10.5 with peaked 13 and today is 7. Patient continues to have good urine output.  He denies any abdominal pain renal ultrasound showed no hydronephrosis, nephrology was consulted who has been treating him conservatively with IV fluids. Other management per nephrology. Ultrasound showed no obstruction. Renal ultrasound showed no obstruction hepatitis B C and HIV are negative. Pt is close to discharge.  Essential hypertension: Continue Norvasc will need to avoid ACE inhibitor after discharge.  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  Shock liver/transaminitis: Likely due to hypotension in setting of volume depletion. Now resolved.  Symptomatic anemia/anemia of chronic disease: He is status post 2 units of packed red blood cells SPEP no M spike and UPEP pending GI was consulted to perform an endoscopy on 06/15/2017 that showed no source of bleeding.  Alcohol abuse: No signs of  withdrawal.  Thrombocytopenia: Likely reactive  Metabolic acidosis: In the setting of acute renal failure resolved with bicarbonate drip.   DVT prophylaxis: cont coumadin per pharmacy Family Communication:Wife Disposition Plan/Barrier to D/C: hopefully tmrw Code Status:     Code Status Orders  (From admission, onward)        Start     Ordered   06/13/17 2244  Full code  Continuous     06/13/17 2243    Code Status History    Date Active Date Inactive Code Status Order ID Comments User Context   10/03/2016 0836 10/03/2016 1342 Full Code [id number]  Serafina Mitchell, MD Inpatient   07/23/2016 2144 08/08/2016 2017 Full Code [id number]  Ivor Costa, MD Inpatient   03/09/2014 1559 03/12/2014 1507 Full Code [id number]  Erick Colace, NP Inpatient        IV Access:    Peripheral IV   Procedures and diagnostic studies:   No results found.   Medical Consultants:    None.  Anti-Infectives:   None  Subjective:    Charles [name] he relates he feels great continues to put good urine output.  Objective:    Vitals:   06/26/17 0531 06/26/17 0532 06/26/17 2118 06/27/17 0530  BP:  135/83 125/78 121/79  Pulse:  86 82 87  Resp:  20 15 14   Temp:  38.7 F (56.4 C) 98 F (36.7 C) 98.5 F (36.9 C)  TempSrc:  Oral Oral Oral  SpO2:  99% 100% 100%  Weight: 73.8 kg (162 lb  11.2 oz)   72.5 kg (159 lb 13.3 oz)  Height:        Intake/Output Summary (Last 24 hours) at 06/27/2017 0916 Last data filed at 06/27/2017 0800 Gross per 24 hour  Intake 480 ml  Output 2400 ml  Net -1920 ml   Filed Weights   06/25/17 0549 06/26/17 0531 06/27/17 0530  Weight: 73 kg (160 lb 15 oz) 73.8 kg (162 lb 11.2 oz) 72.5 kg (159 lb 13.3 oz)    Exam: General exam: In no acute distress. Respiratory system: Good air movement and clear to auscultation. Cardiovascular system: S1 & S2 heard, RRR.  No rubs Gastrointestinal system: Abdomen is nondistended, soft and nontender.  Central nervous  system: Alert and oriented. No focal neurological deficits. Extremities: No pedal edema. Skin: No rashes, lesions or ulcers Psychiatry: Judgement and insight appear normal. Mood & affect appropriate.    Data Reviewed:    Labs: Basic Metabolic Panel: Recent Labs  Lab 06/23/17 0339 06/24/17 0349 06/25/17 0415 06/26/17 0405 06/27/17 0357  NA 140 145 145 145 140  K 3.8 3.7 4.2 4.2 4.1  CL 109 108 103 107 107  CO2 16* 25 28 26 23   GLUCOSE 90 87 85 85 84  BUN 73* 66* 63* 58* 58*  CREATININE 10.20* 8.92* 8.38* 7.59* 7.43*  CALCIUM 8.5* 8.1* 8.3* 8.7* 8.5*  PHOS 5.7* 4.5 3.2 3.8 4.1   GFR Estimated Creatinine Clearance: 10.3 mL/min (A) (by C-G formula based on SCr of 7.43 mg/dL (H)). Liver Function Tests: Recent Labs  Lab 06/23/17 0339 06/24/17 0349 06/25/17 0415 06/26/17 0405 06/27/17 0357  AST  --   --   --   --  19  ALT  --   --   --   --  17  ALKPHOS  --   --   --   --  37*  BILITOT  --   --   --   --  0.7  PROT  --   --   --   --  6.8  ALBUMIN 2.8* 2.8* 2.8* 2.9* 3.1*  3.0*   No results for input(s): LIPASE, AMYLASE in the last 168 hours. No results for input(s): AMMONIA in the last 168 hours. Coagulation profile Recent Labs  Lab 06/23/17 0339 06/24/17 0349 06/25/17 0415 06/26/17 0405 06/27/17 0357  INR 1.74 1.71 2.04 1.85 1.72    CBC: Recent Labs  Lab 06/23/17 0339 06/24/17 0349 06/25/17 0415 06/26/17 0405 06/27/17 0357  WBC 7.6 8.2 8.1 8.0 8.7  HGB 8.2* 7.8* 7.6* 7.6* 7.9*  HCT 24.2* 23.3* 22.6* 22.8* 23.6*  MCV 91.0 90.7 91.5 92.7 92.2  PLT 168 160 177 165 185   Cardiac Enzymes: No results for input(s): CKTOTAL, CKMB, CKMBINDEX, TROPONINI in the last 168 hours. BNP (last 3 results) No results for input(s): PROBNP in the last 8760 hours. CBG: No results for input(s): GLUCAP in the last 168 hours. D-Dimer: No results for input(s): DDIMER in the last 72 hours. Hgb A1c: No results for input(s): HGBA1C in the last 72 hours. Lipid  Profile: No results for input(s): CHOL, HDL, LDLCALC, TRIG, CHOLHDL, LDLDIRECT in the last 72 hours. Thyroid function studies: No results for input(s): TSH, T4TOTAL, T3FREE, THYROIDAB in the last 72 hours.  Invalid input(s): FREET3 Anemia work up: No results for input(s): VITAMINB12, FOLATE, FERRITIN, TIBC, IRON, RETICCTPCT in the last 72 hours. Sepsis Labs: Recent Labs  Lab 06/24/17 0349 06/25/17 0415 06/26/17 0405 06/27/17 0357  WBC 8.2 8.1 8.0 8.7  Microbiology No results found for this or any previous visit (from the past 240 hour(s)).   Medications:   . amLODipine  10 mg Oral Daily  . multivitamin with minerals  1 tablet Oral Daily  . thiamine  100 mg Oral Daily   Or  . thiamine  100 mg Intravenous Daily  . warfarin   Does not apply Once  . Warfarin - Pharmacist Dosing Inpatient   Does not apply q1800   Continuous Infusions: . sodium chloride 10 mL/hr at 06/26/17 1236      LOS: 14 days   [facility] Pager [phone]  *Please refer to [email or web address], password [email or web address] to get updated schedule on who will round on this patient, as hospitalists switch teams weekly. If 7PM-7AM, please contact night-coverage at [email or web address], password [email or web address] for any overnight needs.  06/27/2017, 9:16 AM

## 2017-06-27 NOTE — Progress Notes (Addendum)
S: No new complaints O:BP (!) 149/88 (BP Location: Left Arm)   Pulse 94   Temp 98.6 F (37 C) (Oral)   Resp 18   Ht 6\' 4"  (1.93 m)   Wt 72.5 kg (159 lb 13.3 oz)   SpO2 100%   BMI 19.46 kg/m   Intake/Output Summary (Last 24 hours) at 06/27/2017 1406 Last data filed at 06/27/2017 1256 Gross per 24 hour  Intake 960 ml  Output 2800 ml  Net -1840 ml   Intake/Output: I/O last 3 completed shifts: In: 2726.3 [P.O.:1680; I.V.:1046.3] Out: 4400 [Urine:4400]  Intake/Output this shift:  Total I/O In: 720 [P.O.:720] Out: 600 [Urine:600] Weight change: -1.3 kg (-2 lb 13.9 oz) Gen: NAD CVS: no rub Resp: cta Abd: benign Ext: no edema  Recent Labs  Lab 06/21/17 0336 06/22/17 0348 06/23/17 0339 06/24/17 0349 06/25/17 0415 06/26/17 0405 06/27/17 0357  NA 143 143 140 145 145 145 140  K 3.8 3.8 3.8 3.7 4.2 4.2 4.1  CL 110 112* 109 108 103 107 107  CO2 21* 19* 16* 25 28 26 23   GLUCOSE 87 84 90 87 85 85 84  BUN 77* 74* 73* 66* 63* 58* 58*  CREATININE 11.62* 10.88* 10.20* 8.92* 8.38* 7.59* 7.43*  ALBUMIN 2.7* 2.8* 2.8* 2.8* 2.8* 2.9* 3.1*  3.0*  CALCIUM 8.1* 8.2* 8.5* 8.1* 8.3* 8.7* 8.5*  PHOS 5.7* 5.7* 5.7* 4.5 3.2 3.8 4.1  AST  --   --   --   --   --   --  19  ALT  --   --   --   --   --   --  17   Liver Function Tests: Recent Labs  Lab 06/25/17 0415 06/26/17 0405 06/27/17 0357  AST  --   --  19  ALT  --   --  17  ALKPHOS  --   --  37*  BILITOT  --   --  0.7  PROT  --   --  6.8  ALBUMIN 2.8* 2.9* 3.1*  3.0*   No results for input(s): LIPASE, AMYLASE in the last 168 hours. No results for input(s): AMMONIA in the last 168 hours. CBC: Recent Labs  Lab 06/23/17 0339 06/24/17 0349 06/25/17 0415 06/26/17 0405 06/27/17 0357  WBC 7.6 8.2 8.1 8.0 8.7  HGB 8.2* 7.8* 7.6* 7.6* 7.9*  HCT 24.2* 23.3* 22.6* 22.8* 23.6*  MCV 91.0 90.7 91.5 92.7 92.2  PLT 168 160 177 165 185   Cardiac Enzymes: No results for input(s): CKTOTAL, CKMB, CKMBINDEX, TROPONINI in the last 168  hours. CBG: No results for input(s): GLUCAP in the last 168 hours.  Iron Studies: No results for input(s): IRON, TIBC, TRANSFERRIN, FERRITIN in the last 72 hours. Studies/Results: No results found. Marland Kitchen amLODipine  10 mg Oral Daily  . multivitamin with minerals  1 tablet Oral Daily  . thiamine  100 mg Oral Daily   Or  . thiamine  100 mg Intravenous Daily  . warfarin  10 mg Oral ONCE-1800  . warfarin   Does not apply Once  . Warfarin - Pharmacist Dosing Inpatient   Does not apply q1800    BMET    Component Value Date/Time   NA 140 06/27/2017 0357   K 4.1 06/27/2017 0357   CL 107 06/27/2017 0357   CO2 23 06/27/2017 0357   GLUCOSE 84 06/27/2017 0357   BUN 58 (H) 06/27/2017 0357   CREATININE 7.43 (H) 06/27/2017 0357   CALCIUM 8.5 (L) 06/27/2017  0357   GFRNONAA 7 (L) 06/27/2017 0357   GFRAA 8 (L) 06/27/2017 0357   CBC    Component Value Date/Time   WBC 8.7 06/27/2017 0357   RBC 2.56 (L) 06/27/2017 0357   HGB 7.9 (L) 06/27/2017 0357   HCT 23.6 (L) 06/27/2017 0357   PLT 185 06/27/2017 0357   MCV 92.2 06/27/2017 0357   MCH 30.9 06/27/2017 0357   MCHC 33.5 06/27/2017 0357   RDW 16.4 (H) 06/27/2017 0357   LYMPHSABS 2.2 06/20/2017 0336   MONOABS 0.8 06/20/2017 0336   EOSABS 0.1 06/20/2017 0336   BASOSABS 0.1 06/20/2017 0336    Assessment/Plan:  1. AKI/CKD stage 3- acute onset with marked increase in Scr and history of worsening lower extremity edema, dizziness, and unsteady gait. Also with new onset of anemia also noted to have some microscopic hematuria and subnephrotic proteinuria. Presumably due to ischemic atn in setting of relative hypotension/hypovolemia with concomitant ACE-inhibition, however will need to r/o myeloma given presentation (SPEP was negative for M spike, UPEP pending). Renal US ordered 06/13/17 without obstruction. GN workup negative ANA, dsDNA, ASO, ANCA, and normal complements. Negative HIV/hepB/Hep C.  1. Renal function continues to slowly  improve. 2. Stopped IVF's 06/26/17 and with some improvement.  Told to increase po intake as he needs to take in at least 1 liter of water daily and if the improvement continues, he may be able to go home in the next 24 hours and f/u with Dr. Florene Glen in our office as an outpatient and follow labs closely with PCP.  2. Metabolic acidosis- improved with IV bicarb will change to IV NS and follow 3. Symptomatic anemia- EGD on 06/15/17 without evidence of GIB. As above r/o myeloma s/p transfusion of 2 units PRBC's.  4. Abnormal LFT's consistent with shock liver per GI.  Need to recheck in am.  5. H/o etoh abuse, recent cessation 6. COPD- stable 7. HTN- stable, avoid ACE/ARB for now 8. Hypoalbuminemia- possibly related to liver disease and r/o myeloma as above 9. H/o pulmonary embolus 2018 was on xarelto now on coumadin 10. H/o CVA  Donetta Potts, MD [employer] [phone]

## 2017-06-27 NOTE — Progress Notes (Signed)
[facility] for Warfarin Indication: hx DVT and pulmonary embolus, on xarelto PTA  No Known Allergies  Patient Measurements: Height: 6\' 4"  (193 cm) Weight: 159 lb 13.3 oz (72.5 kg) IBW/kg (Calculated) : 86.8 Heparin Dosing Weight: 77.4kg  Vital Signs: Temp: 98.5 F (36.9 C) (05/08 0530) Temp Source: Oral (05/08 0530) BP: 121/79 (05/08 0530) Pulse Rate: 87 (05/08 0530)  Labs: Recent Labs    06/25/17 0415 06/26/17 0405 06/27/17 0357  HGB 7.6* 7.6* 7.9*  HCT 22.6* 22.8* 23.6*  PLT 177 165 185  LABPROT 22.9* 21.2* 20.0*  INR 2.04 1.85 1.72  HEPARINUNFRC 0.55  --   --   CREATININE 8.38* 7.59* 7.43*   Estimated Creatinine Clearance: 10.3 mL/min (A) (by C-G formula based on SCr of 7.43 mg/dL (H)).  Medical History: Past Medical History:  Diagnosis Date  . ABNORMAL ELECTROCARDIOGRAM 11/02/2008  . ABSCESS, LUNG 10/23/2006  . Anemia 05/26/2014  . BACTERIAL PNEUMONIA 12/28/2009  . BPH (benign prostatic hyperplasia) 11/22/2010  . Cerebellar stroke ([place]) 05/26/2014  . CHEST PAIN 12/24/2009  . Coronary artery calcification seen on CT scan 06/01/2015  . Cramp of limb 07/19/2007  . DIVERTICULOSIS, COLON 03/07/2007  . DVT (deep venous thrombosis) ([place]) 01/2014   LLE  . Emphysema of lung ([place])   . 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/23/2006  . FLANK PAIN, LEFT 02/25/2010  . FREQUENCY, URINARY 12/24/2009  . HEMORRHOIDS 10/23/2006  . HYPERLIPIDEMIA 10/23/2006  . HYPERTENSION 10/23/2006  . PE (pulmonary embolism) 02/2014  . PLANTAR FASCIITIS, LEFT 11/02/2008  . Pneumonia 12/2013 X 2  . PSA, INCREASED 11/20/2007  . PULMONARY NODULE 05/14/2008  . Unspecified Peripheral Vascular Disease 10/23/2006   Medications:  Scheduled:  . amLODipine  10 mg Oral Daily  . multivitamin with minerals  1 tablet Oral Daily  . thiamine  100 mg Oral Daily   Or  . thiamine  100 mg Intravenous Daily  . warfarin   Does not apply Once  . Warfarin - Pharmacist Dosing Inpatient   Does not apply q1800    Infusions:  . sodium chloride 10 mL/hr at 06/26/17 1236   Assessment: Patient is a 67 y.o M with hx DVT/PE on xarelto 10 mg daily PTA, presented to the ED on 06/13/17 for workup of renal insufficiency.  Patient was subsequently transitioned to heparin and warfarin inpatient.  Significant events: 4/26 upper GI endoscopy: no signif. findings 4/30 Begin Heparin and Warfarin, unable to use Xarelto with severe renal dysfunction 5/6 heparin stopped  Today, 06/27/2017: - INR is subtherapeutic at 1.72 - hgb low but stable, plts stable - no bleeding documented - no significant drug-drug intxns - scr 7.43 (crcl 10)  Goal of Therapy:  INR 2-3 Heparin level 0.3-0.7 units/ml Monitor platelets by anticoagulation protocol: Yes   Plan:  - Warfarin 10 mg PO x1 - Daily INR  - monitor for s/s bleeding   Royetta Asal, PharmD, BCPS Pager [phone] 06/27/2017 10:08 AM

## 2017-06-28 LAB — RENAL FUNCTION PANEL
Albumin: 2.8 g/dL — ABNORMAL LOW (ref 3.5–5.0)
Anion gap: 12 (ref 5–15)
BUN: 56 mg/dL — ABNORMAL HIGH (ref 6–20)
CALCIUM: 8.8 mg/dL — AB (ref 8.9–10.3)
CO2: 20 mmol/L — AB (ref 22–32)
CREATININE: 7.38 mg/dL — AB (ref 0.61–1.24)
Chloride: 108 mmol/L (ref 101–111)
GFR calc Af Amer: 8 mL/min — ABNORMAL LOW (ref >60)
GFR calc non Af Amer: 7 mL/min — ABNORMAL LOW (ref >60)
GLUCOSE: 87 mg/dL (ref 65–99)
Phosphorus: 4.7 mg/dL — ABNORMAL HIGH (ref 2.5–4.6)
Potassium: 4.2 mmol/L (ref 3.5–5.1)
SODIUM: 140 mmol/L (ref 135–145)

## 2017-06-28 LAB — URINALYSIS, COMPLETE (UACMP) WITH MICROSCOPIC
Bilirubin Urine: NEGATIVE
GLUCOSE, UA: NEGATIVE mg/dL
KETONES UR: NEGATIVE mg/dL
Leukocytes, UA: NEGATIVE
NITRITE: NEGATIVE
PROTEIN: NEGATIVE mg/dL
Specific Gravity, Urine: 1.008 (ref 1.005–1.030)
pH: 7 (ref 5.0–8.0)

## 2017-06-28 LAB — CBC
HCT: 22.9 % — ABNORMAL LOW (ref 39.0–52.0)
Hemoglobin: 7.6 g/dL — ABNORMAL LOW (ref 13.0–17.0)
MCH: 30.6 pg (ref 26.0–34.0)
MCHC: 33.2 g/dL (ref 30.0–36.0)
MCV: 92.3 fL (ref 78.0–100.0)
PLATELETS: 177 10*3/uL (ref 150–400)
RBC: 2.48 MIL/uL — AB (ref 4.22–5.81)
RDW: 16.6 % — ABNORMAL HIGH (ref 11.5–15.5)
WBC: 9 10*3/uL (ref 4.0–10.5)

## 2017-06-28 LAB — PROTIME-INR
INR: 1.99
PROTHROMBIN TIME: 22.5 s — AB (ref 11.4–15.2)

## 2017-06-28 MED ORDER — WARFARIN SODIUM 5 MG PO TABS
10.0000 mg | ORAL_TABLET | Freq: Once | ORAL | Status: AC
  Administered 2017-06-28: 10 mg via ORAL
  Filled 2017-06-28: qty 2

## 2017-06-28 MED ORDER — SODIUM CHLORIDE 0.9 % IV SOLN
INTRAVENOUS | Status: DC
  Administered 2017-06-28 – 2017-06-29 (×3): via INTRAVENOUS

## 2017-06-28 NOTE — Progress Notes (Signed)
S: Feels well but is concerned that his creatinine hasn't gotten much better.   O:BP 127/84 (BP Location: Left Arm)   Pulse 98   Temp 98 F (36.7 C) (Oral)   Resp 15   Ht 6\' 4"  (1.93 m)   Wt 72.5 kg (159 lb 13.3 oz)   SpO2 98%   BMI 19.46 kg/m   Intake/Output Summary (Last 24 hours) at 06/28/2017 0951 Last data filed at 06/28/2017 8315 Gross per 24 hour  Intake 2580 ml  Output 2270 ml  Net 310 ml   Intake/Output: I/O last 3 completed shifts: In: 2580 [P.O.:2580] Out: 4670 [Urine:4670]  Intake/Output this shift:  No intake/output data recorded. Weight change:  Gen:NAD CVS: no rub Resp: cta  VVO:HYWVPX Ext:no edema  Recent Labs  Lab 06/22/17 0348 06/23/17 0339 06/24/17 0349 06/25/17 0415 06/26/17 0405 06/27/17 0357 06/28/17 0328  NA 143 140 145 145 145 140 140  K 3.8 3.8 3.7 4.2 4.2 4.1 4.2  CL 112* 109 108 103 107 107 108  CO2 19* 16* 25 28 26 23  20*  GLUCOSE 84 90 87 85 85 84 87  BUN 74* 73* 66* 63* 58* 58* 56*  CREATININE 10.88* 10.20* 8.92* 8.38* 7.59* 7.43* 7.38*  ALBUMIN 2.8* 2.8* 2.8* 2.8* 2.9* 3.1*  3.0* 2.8*  CALCIUM 8.2* 8.5* 8.1* 8.3* 8.7* 8.5* 8.8*  PHOS 5.7* 5.7* 4.5 3.2 3.8 4.1 4.7*  AST  --   --   --   --   --  19  --   ALT  --   --   --   --   --  17  --    Liver Function Tests: Recent Labs  Lab 06/26/17 0405 06/27/17 0357 06/28/17 0328  AST  --  19  --   ALT  --  17  --   ALKPHOS  --  37*  --   BILITOT  --  0.7  --   PROT  --  6.8  --   ALBUMIN 2.9* 3.1*  3.0* 2.8*   No results for input(s): LIPASE, AMYLASE in the last 168 hours. No results for input(s): AMMONIA in the last 168 hours. CBC: Recent Labs  Lab 06/24/17 0349 06/25/17 0415 06/26/17 0405 06/27/17 0357 06/28/17 0328  WBC 8.2 8.1 8.0 8.7 9.0  HGB 7.8* 7.6* 7.6* 7.9* 7.6*  HCT 23.3* 22.6* 22.8* 23.6* 22.9*  MCV 90.7 91.5 92.7 92.2 92.3  PLT 160 177 165 185 177   Cardiac Enzymes: No results for input(s): CKTOTAL, CKMB, CKMBINDEX, TROPONINI in the last 168  hours. CBG: No results for input(s): GLUCAP in the last 168 hours.  Iron Studies: No results for input(s): IRON, TIBC, TRANSFERRIN, FERRITIN in the last 72 hours. Studies/Results: No results found. Marland Kitchen amLODipine  10 mg Oral Daily  . multivitamin with minerals  1 tablet Oral Daily  . thiamine  100 mg Oral Daily   Or  . thiamine  100 mg Intravenous Daily  . warfarin   Does not apply Once  . Warfarin - Pharmacist Dosing Inpatient   Does not apply q1800    BMET    Component Value Date/Time   NA 140 06/28/2017 0328   K 4.2 06/28/2017 0328   CL 108 06/28/2017 0328   CO2 20 (L) 06/28/2017 0328   GLUCOSE 87 06/28/2017 0328   BUN 56 (H) 06/28/2017 0328   CREATININE 7.38 (H) 06/28/2017 0328   CALCIUM 8.8 (L) 06/28/2017 0328   GFRNONAA 7 (L) 06/28/2017 1062  GFRAA 8 (L) 06/28/2017 0328   CBC    Component Value Date/Time   WBC 9.0 06/28/2017 0328   RBC 2.48 (L) 06/28/2017 0328   HGB 7.6 (L) 06/28/2017 0328   HCT 22.9 (L) 06/28/2017 0328   PLT 177 06/28/2017 0328   MCV 92.3 06/28/2017 0328   MCH 30.6 06/28/2017 0328   MCHC 33.2 06/28/2017 0328   RDW 16.6 (H) 06/28/2017 0328   LYMPHSABS 2.2 06/20/2017 0336   MONOABS 0.8 06/20/2017 0336   EOSABS 0.1 06/20/2017 0336   BASOSABS 0.1 06/20/2017 0336    Assessment/Plan:  1. AKI/CKD stage 3- acute onset with marked increase in Scr and history of worsening lower extremity edema, dizziness, and unsteady gait. Also with new onset of anemia also noted to have some microscopic hematuria and subnephrotic proteinuria. Presumably due to ischemic atn in setting of relative hypotension/hypovolemia with concomitant ACE-inhibition, however will need to r/o myeloma given presentation (SPEP was negative for M spike, UPEPpending). Renal US ordered 06/13/17 without obstruction. GN workupnegative ANA, dsDNA, ASO, ANCA, and normal complements. Negative HIV/hepB/Hep C.  1. Renal function continues to slowly improve. 2. Stopped IVF's 06/26/17 and  with some improvement.  Told to increase po intake as he needs to take in at least 1 liter of water daily and if the improvement continues, he may be able to go home in the next 24 hours and f/u with Dr. Florene Glen in our office as an outpatient and follow labs closely with PCP. 3. Pt doesn't think he can keep up fluid intake and nervous to go home.  Discussed with Dr. Olevia Bowens and will restart IVF's and follow UOP and Scr. 2. Metabolic acidosis- improved with IV bicarb will change to IV NS and follow 3. Symptomatic anemia- EGD on 06/15/17 without evidence of GIB. As above r/o myeloma s/p transfusion of 2 units PRBC's.  4. Abnormal LFT's consistent with shock liver per GI. Need to recheck in am.  5. H/o etoh abuse, recent cessation 6. COPD- stable 7. HTN- stable, avoid ACE/ARB for now 8. Hypoalbuminemia- possibly related to liver disease and r/o myeloma as above 9. H/o pulmonary embolus 2018wason xareltonow on coumadin 10. H/o CVA   Donetta Potts, MD [employer] [phone]

## 2017-06-28 NOTE — Progress Notes (Signed)
[facility] PROGRESS NOTE    Progress Note  Charles [name]  EGB:[id number] DOB: 1950/03/29 DOA: 06/13/2017 PCP: Biagio Borg, MD     Brief Narrative:   Charles [name] is an 67 y.o. 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  Went to see his primary care doctor who found him in acute renal failure in 7 to the ED, his creatinine on admission was 11.1, with a bicarb of 18 and a potassium of 3.9 white count of 9.6 hemoglobin of 7.3 UA showed greater than 50 red blood cells and 21-50 white blood cells urinary sodium was 33 PT was positive, CT scan of the abdomen without contrast was negative for hydronephrosis or stone  Assessment/Plan:   ATN on stage 3 chronic kidney disease ([place]) Based on creatinine of 1.4.  Creatinine is slowly improving ATN is likely due to hypotension in the setting of ACE inhibitor use. Start him back on IV fluid hydration recheck basic metabolic panel in the morning. Immunologic panel negative hepatitis BC and HIV are negative Renal ultrasound showed no obstruction hepatitis B C and HIV are negative.  Essential hypertension: Continue Norvasc will need to avoid ACE inhibitor after discharge.  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  His INR is slowly improving.  Shock liver/transaminitis: Likely due to hypotension in setting of volume depletion. Now resolved.  Symptomatic anemia/anemia of chronic disease: He is status post 2 units of packed red blood cells SPEP no M spike and UPEP no signs of myeloma. GI was consulted to perform an endoscopy on 06/15/2017 that showed no source of bleeding. Will need a colonoscopy as an outpatient.  No signs of overt bleeding.  Alcohol abuse: No signs of withdrawal.  Thrombocytopenia: Likely reactive  Metabolic acidosis: In the  setting of acute renal failure resolved with bicarbonate drip.   DVT prophylaxis: cont coumadin per pharmacy Family Communication:Wife Disposition Plan/Barrier to D/C: hopefully [email or web address] Code Status:     Code Status Orders  (From admission, onward)        Start     Ordered   06/13/17 2244  Full code  Continuous     06/13/17 2243    Code Status History    Date Active Date Inactive Code Status Order ID Comments User Context   10/03/2016 0836 10/03/2016 1342 Full Code [id number]  Serafina Mitchell, MD Inpatient   07/23/2016 2144 08/08/2016 2017 Full Code [id number]  Ivor Costa, MD Inpatient   03/09/2014 1559 03/12/2014 1507 Full Code [id number]  Erick Colace, NP Inpatient        IV Access:    Peripheral IV   Procedures and diagnostic studies:   No results found.   Medical Consultants:    None.  Anti-Infectives:   None  Subjective:    Charles [name] he relate that he wanted to stay in the hospital until his creatinine returned to baseline I told him that was not possible.  Objective:    Vitals:   06/27/17 0530 06/27/17 1252 06/27/17 2135 06/28/17 0506  BP: 121/79 (!) 149/88 131/87 127/84  Pulse: 87 94 80 98  Resp: 14 18 16 15   Temp: 98.5 F (36.9 C) 98.6 F (37 C) 98.6 F (37 C) 98 F (36.7 C)  TempSrc: Oral Oral Oral Oral  SpO2:  100% 100% 100% 98%  Weight: 72.5 kg (159 lb 13.3 oz)     Height:        Intake/Output Summary (Last 24 hours) at 06/28/2017 0827 Last data filed at 06/28/2017 7209 Gross per 24 hour  Intake 2580 ml  Output 2270 ml  Net 310 ml   Filed Weights   06/25/17 0549 06/26/17 0531 06/27/17 0530  Weight: 73 kg (160 lb 15 oz) 73.8 kg (162 lb 11.2 oz) 72.5 kg (159 lb 13.3 oz)    Exam: General exam: In no acute distress. Respiratory system: Good air movement and clear to auscultation. Cardiovascular system: S1 & S2 heard, RRR.  No rubs Gastrointestinal system: Abdomen is nondistended, soft and nontender.  Central nervous system: Alert  and oriented. No focal neurological deficits. Extremities: No pedal edema. Skin: No rashes, lesions or ulcers Psychiatry: Judgement and insight appear normal. Mood & affect appropriate.    Data Reviewed:    Labs: Basic Metabolic Panel: Recent Labs  Lab 06/24/17 0349 06/25/17 0415 06/26/17 0405 06/27/17 0357 06/28/17 0328  NA 145 145 145 140 140  K 3.7 4.2 4.2 4.1 4.2  CL 108 103 107 107 108  CO2 25 28 26 23  20*  GLUCOSE 87 85 85 84 87  BUN 66* 63* 58* 58* 56*  CREATININE 8.92* 8.38* 7.59* 7.43* 7.38*  CALCIUM 8.1* 8.3* 8.7* 8.5* 8.8*  PHOS 4.5 3.2 3.8 4.1 4.7*   GFR Estimated Creatinine Clearance: 10.4 mL/min (A) (by C-G formula based on SCr of 7.38 mg/dL (H)). Liver Function Tests: Recent Labs  Lab 06/24/17 0349 06/25/17 0415 06/26/17 0405 06/27/17 0357 06/28/17 0328  AST  --   --   --  19  --   ALT  --   --   --  17  --   ALKPHOS  --   --   --  37*  --   BILITOT  --   --   --  0.7  --   PROT  --   --   --  6.8  --   ALBUMIN 2.8* 2.8* 2.9* 3.1*  3.0* 2.8*   No results for input(s): LIPASE, AMYLASE in the last 168 hours. No results for input(s): AMMONIA in the last 168 hours. Coagulation profile Recent Labs  Lab 06/24/17 0349 06/25/17 0415 06/26/17 0405 06/27/17 0357 06/28/17 0328  INR 1.71 2.04 1.85 1.72 1.99    CBC: Recent Labs  Lab 06/24/17 0349 06/25/17 0415 06/26/17 0405 06/27/17 0357 06/28/17 0328  WBC 8.2 8.1 8.0 8.7 9.0  HGB 7.8* 7.6* 7.6* 7.9* 7.6*  HCT 23.3* 22.6* 22.8* 23.6* 22.9*  MCV 90.7 91.5 92.7 92.2 92.3  PLT 160 177 165 185 177   Cardiac Enzymes: No results for input(s): CKTOTAL, CKMB, CKMBINDEX, TROPONINI in the last 168 hours. BNP (last 3 results) No results for input(s): PROBNP in the last 8760 hours. CBG: No results for input(s): GLUCAP in the last 168 hours. D-Dimer: No results for input(s): DDIMER in the last 72 hours. Hgb A1c: No results for input(s): HGBA1C in the last 72 hours. Lipid Profile: No results for  input(s): CHOL, HDL, LDLCALC, TRIG, CHOLHDL, LDLDIRECT in the last 72 hours. Thyroid function studies: No results for input(s): TSH, T4TOTAL, T3FREE, THYROIDAB in the last 72 hours.  Invalid input(s): FREET3 Anemia work up: No results for input(s): VITAMINB12, FOLATE, FERRITIN, TIBC, IRON, RETICCTPCT in the last 72 hours. Sepsis Labs: Recent Labs  Lab 06/25/17 0415 06/26/17 0405 06/27/17 0357 06/28/17 0328  WBC  8.1 8.0 8.7 9.0   Microbiology No results found for this or any previous visit (from the past 240 hour(s)).   Medications:   . amLODipine  10 mg Oral Daily  . multivitamin with minerals  1 tablet Oral Daily  . thiamine  100 mg Oral Daily   Or  . thiamine  100 mg Intravenous Daily  . warfarin   Does not apply Once  . Warfarin - Pharmacist Dosing Inpatient   Does not apply q1800   Continuous Infusions: . sodium chloride 10 mL/hr at 06/26/17 1236      LOS: 15 days   [facility] Pager [phone]  *Please refer to [email or web address], password [email or web address] to get updated schedule on who will round on this patient, as hospitalists switch teams weekly. If 7PM-7AM, please contact night-coverage at [email or web address], password [email or web address] for any overnight needs.  06/28/2017, 8:27 AM

## 2017-06-28 NOTE — Progress Notes (Signed)
[place] for Warfarin Indication: hx DVT and pulmonary embolus, on xarelto PTA  No Known Allergies  Patient Measurements: Height: 6\' 4"  (193 cm) Weight: 159 lb 13.3 oz (72.5 kg) IBW/kg (Calculated) : 86.8 Heparin Dosing Weight: 77.4kg  Vital Signs: Temp: 98 F (36.7 C) (05/09 0506) Temp Source: Oral (05/09 0506) BP: 127/84 (05/09 0506) Pulse Rate: 98 (05/09 0506)  Labs: Recent Labs    06/26/17 0405 06/27/17 0357 06/28/17 0328  HGB 7.6* 7.9* 7.6*  HCT 22.8* 23.6* 22.9*  PLT 165 185 177  LABPROT 21.2* 20.0* 22.5*  INR 1.85 1.72 1.99  CREATININE 7.59* 7.43* 7.38*   Estimated Creatinine Clearance: 10.4 mL/min (A) (by C-G formula based on SCr of 7.38 mg/dL (H)).  Medical History: Past Medical History:  Diagnosis Date  . ABNORMAL ELECTROCARDIOGRAM 11/02/2008  . ABSCESS, LUNG 10/23/2006  . Anemia 05/26/2014  . BACTERIAL PNEUMONIA 12/28/2009  . BPH (benign prostatic hyperplasia) 11/22/2010  . Cerebellar stroke ([place]) 05/26/2014  . CHEST PAIN 12/24/2009  . Coronary artery calcification seen on CT scan 06/01/2015  . Cramp of limb 07/19/2007  . DIVERTICULOSIS, COLON 03/07/2007  . DVT (deep venous thrombosis) ([place]) 01/2014   LLE  . Emphysema of lung ([place])   . 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/23/2006  . FLANK PAIN, LEFT 02/25/2010  . FREQUENCY, URINARY 12/24/2009  . HEMORRHOIDS 10/23/2006  . HYPERLIPIDEMIA 10/23/2006  . HYPERTENSION 10/23/2006  . PE (pulmonary embolism) 02/2014  . PLANTAR FASCIITIS, LEFT 11/02/2008  . Pneumonia 12/2013 X 2  . PSA, INCREASED 11/20/2007  . PULMONARY NODULE 05/14/2008  . Unspecified Peripheral Vascular Disease 10/23/2006   Medications:  Scheduled:  . amLODipine  10 mg Oral Daily  . multivitamin with minerals  1 tablet Oral Daily  . thiamine  100 mg Oral Daily   Or  . thiamine  100 mg Intravenous Daily  . warfarin   Does not apply Once  . Warfarin - Pharmacist Dosing Inpatient   Does not apply q1800   Infusions:  . sodium  chloride 10 mL/hr at 06/26/17 1236  . sodium chloride     Assessment: Patient is a 67 y.o M with hx DVT/PE on xarelto 10 mg daily PTA, presented to the ED on 06/13/17 for workup of renal insufficiency.  Patient was subsequently transitioned to heparin and warfarin inpatient.  Significant events: 4/26 upper GI endoscopy: no signif. findings 4/30 Begin Heparin and Warfarin, unable to use Xarelto with severe renal dysfunction 5/6 heparin stopped  Today, 06/28/2017: - INR is subtherapeutic at 1.99, trending up - hgb low but stable, plts stable - no bleeding documented - no significant drug-drug intxns - scr 7.38 (crcl 10)  Goal of Therapy:  INR 2-3 Heparin level 0.3-0.7 units/ml Monitor platelets by anticoagulation protocol: Yes   Plan:  - Warfarin 10 mg PO x1 - Daily INR  - monitor for s/s bleeding   Royetta Asal, PharmD, BCPS Pager [phone] 06/28/2017 10:08 AM

## 2017-06-29 LAB — RENAL FUNCTION PANEL
ALBUMIN: 3 g/dL — AB (ref 3.5–5.0)
ANION GAP: 10 (ref 5–15)
BUN: 57 mg/dL — ABNORMAL HIGH (ref 6–20)
CALCIUM: 8.3 mg/dL — AB (ref 8.9–10.3)
CO2: 18 mmol/L — AB (ref 22–32)
CREATININE: 6.72 mg/dL — AB (ref 0.61–1.24)
Chloride: 112 mmol/L — ABNORMAL HIGH (ref 101–111)
GFR calc Af Amer: 9 mL/min — ABNORMAL LOW (ref >60)
GFR calc non Af Amer: 8 mL/min — ABNORMAL LOW (ref >60)
GLUCOSE: 87 mg/dL (ref 65–99)
PHOSPHORUS: 4.1 mg/dL (ref 2.5–4.6)
Potassium: 4.2 mmol/L (ref 3.5–5.1)
SODIUM: 140 mmol/L (ref 135–145)

## 2017-06-29 LAB — PROTIME-INR
INR: 2.15
Prothrombin Time: 23.8 seconds — ABNORMAL HIGH (ref 11.4–15.2)

## 2017-06-29 MED ORDER — SODIUM CHLORIDE 0.45 % IV SOLN: INTRAVENOUS | Status: DC

## 2017-06-29 MED ORDER — SODIUM BICARBONATE 8.4 % IV SOLN
INTRAVENOUS | Status: DC
  Administered 2017-06-29: 12:00:00 via INTRAVENOUS
  Filled 2017-06-29 (×2): qty 150

## 2017-06-29 MED ORDER — SODIUM BICARBONATE 8.4 % IV SOLN
INTRAVENOUS | Status: DC
  Filled 2017-06-29: qty 1000

## 2017-06-29 MED ORDER — WARFARIN SODIUM 5 MG PO TABS: 5.0000 mg | ORAL_TABLET | Freq: Every day | ORAL | 0 refills | Status: DC

## 2017-06-29 NOTE — Discharge Summary (Addendum)
Physician Discharge Summary  Charles [name] JOI:[id number] DOB: November 10, 1950 DOA: 06/13/2017  PCP: Biagio Borg, MD  Admit date: 06/13/2017 Discharge date: 06/29/2017  Admitted From: home Disposition:  Home  Recommendations for Outpatient Follow-up:  1. Follow up with PCP in 1-2 weeks 2. Check PT and INR and titrate Coumadin as needed. 3. Home health will go home on 07/02/2017 to check an INR will be sent to PCP.  Home Health:No Equipment/Devices:none  Discharge Condition:stable CODE STATUS:full Diet recommendation: Heart Healthy  Brief/Interim Summary: 67 y.o. 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  Went to see his primary care doctor who found him in acute renal failure in 7 to the ED, his creatinine on admission was 11.1, with a bicarb of 18 and a potassium of 3.9 white count of 9.6 hemoglobin of 7.3 UA showed greater than 50 red blood cells and 21-50 white blood cells urinary sodium was 33 PT was positive, CT scan of the abdomen without contrast was negative for hydronephrosis or stone    Discharge Diagnoses:  Principal Problem:   CKD (chronic kidney disease), stage III ([place]) Active Problems:   Hyperlipidemia   Unspecified Peripheral Vascular Disease   GERD   Abnormal LFTs   BPH (benign prostatic hyperplasia)   Anemia   Gross hematuria   Dehydration   Symptomatic anemia   COPD not affecting current episode of care ([place])   Hx of pulmonary embolus   Thrombocytopenia ([facility])   Alcohol abuse   Acute renal failure (ARF) ([facility])   ATN (acute tubular necrosis) (H[facility])   Melena  ATN on stage III chronic kidney disease: Baseline creatinine of 1.1 likely due to hypotension in the setting of ACE inhibitor. Nephrology was consulted who recommended conservative management with IV fluid hydration and his creatinine slowly improved immunologic panel was negative, HIV with hepatitis B and C were  negative.  Essential hypertension: Continue Norvasc will need to avoid ACE inhibitor, patient is not to restart ACE inhibitor until he is seen by his PCP who moved make that determination.  History of DVT and PE on IVC filter: Due to worsening renal function Xarelto was DC'd he was started on Coumadin and heparin once his INR became therapeutic he was continued on Coumadin.  He will follow-up with PCP in 1 week to check an INR level and titrate medications as needed.  Shock liver: Likely due to hypotension now resolved.  Symptomatic anemia/anemia of chronic disease: He status post 2 units of packed red blood cells his hemoglobin improved, SPEP and UPEP show no signs of myeloma. GI was consulted and performed an upper endoscopy that showed no signs of bleeding, he had a colonoscopy 2 years prior that showed diverticuli.  Alcohol abuse: No signs of withdrawal.  Thrombocytopenia: Likely reactive.  Metabolic acidosis: In the setting of acute renal failure now resolved.  Discharge Instructions  Discharge Instructions    Diet - low sodium heart healthy   Complete by:  As directed    Diet - low sodium heart healthy   Complete by:  As directed    Discharge instructions   Complete by:  As directed    Face-to-face encounter (required for Medicare/Medicaid patients)   Complete by:  As directed    I [name] certify that this patient is under my care and that I, or a nurse practitioner or physician's assistant working with me, had a face-to-face encounter that meets  the physician face-to-face encounter requirements with this patient on 06/29/2017. The encounter with the patient was in whole, or in part for the following medical condition(s) which is the primary reason for home health care (List medical condition): Check an INR on 07/02/2017   The encounter with the patient was in whole, or in part, for the following medical condition, which is the primary reason for home health care:   Check INR   I certify that, based on my findings, the following services are medically necessary home health services:  Nursing   Reason for Medically Necessary Home Health Services:  Other See Comments   My clinical findings support the need for the above services:  OTHER SEE COMMENTS   Further, I certify that my clinical findings support that this patient is homebound due to:  Can transfer bed to chair only   Home Health   Complete by:  As directed    To provide the following care/treatments:  [place]   Increase activity slowly   Complete by:  As directed    Increase activity slowly   Complete by:  As directed      Allergies as of 06/29/2017   No Known Allergies     Medication List    STOP taking these medications   lisinopril 20 MG tablet Commonly known as:  PRINIVIL,ZESTRIL   rivaroxaban 10 MG Tabs tablet Commonly known as:  XARELTO   XARELTO 10 MG Tabs tablet Generic drug:  rivaroxaban     TAKE these medications   amLODipine 10 MG tablet Commonly known as:  NORVASC Take 1 tablet (10 mg total) by mouth daily.   ezetimibe 10 MG tablet Commonly known as:  ZETIA Take 1 tablet (10 mg total) by mouth daily.   rosuvastatin 40 MG tablet Commonly known as:  CRESTOR TAKE 1 TABLET DAILY   tadalafil 20 MG tablet Commonly known as:  CIALIS Take 1 tablet (20 mg total) by mouth daily as needed for erectile dysfunction. What changed:  Another medication with the same name was removed. Continue taking this medication, and follow the directions you see here.   warfarin 5 MG tablet Commonly known as:  COUMADIN Take 1 tablet (5 mg total) by mouth daily. Take 2 tablets on Saturday Tuesday and Thursdays, then 1-1/2 tablets Monday Wednesday Friday and Sunday      Follow-up Information    Biagio Borg, MD Follow up in 1 week(s).   Specialties:  Internal Medicine, Radiology Why:  Please go to office for 1:15pm INR check. Contact information: [address]  [place] [address] [phone]          No Known Allergies  Consultations:  Nephrology   Procedures/Studies: Ct Abdomen Pelvis Wo Contrast  Result Date: 06/13/2017 CLINICAL DATA:  Anemia generalized weakness and fatigue decreased appetite abnormal labs EXAM: CT ABDOMEN AND PELVIS WITHOUT CONTRAST TECHNIQUE: Multidetector CT imaging of the abdomen and pelvis was performed following the standard protocol without IV contrast. COMPARISON:  PET-CT 06/11/2008, CT chest 10/24/2016 FINDINGS: Lower chest: Lung bases demonstrate scarring within the subpleural right lung base. Nodular subpleural densities in the left lower lobe, likely scarring. No acute consolidation or pleural effusion. Normal heart size. Small hiatal hernia. Hepatobiliary: No focal liver abnormality is seen. No gallstones, gallbladder wall thickening, or biliary dilatation. Pancreas: Unremarkable. No pancreatic ductal dilatation or surrounding inflammatory changes. Spleen: Normal in size without focal abnormality. Adrenals/Urinary Tract: Adrenal glands are within normal limits. No hydronephrosis. Small  2-3 mm stone lower pole left kidney. Possible cyst mid right kidney. Slightly thick-walled appearance of the bladder. Possible small posterior bladder diverticulum. Stomach/Bowel: Stomach is within normal limits. Appendix appears normal. No evidence of bowel wall thickening, distention, or inflammatory changes. Sigmoid colon diverticula without acute inflammation Vascular/Lymphatic: Moderate-to-marked aortic atherosclerosis. No aneurysmal dilatation. No significantly enlarged lymph nodes. Reproductive: Slightly enlarged prostate Other: Negative for free air or free fluid. Musculoskeletal: Degenerative changes. No acute or suspicious abnormality. IMPRESSION: 1. Negative for hydronephrosis or ureteral stone. Small nonobstructing stone in the left kidney. 2. Sigmoid colon diverticular disease without acute inflammation Electronically  Signed   By: Donavan Foil M.D.   On: 06/13/2017 22:00   Dg Chest 2 View  Result Date: 06/13/2017 CLINICAL DATA:  Elevated creatinine with decreased urine output EXAM: CHEST - 2 VIEW COMPARISON:  CT 10/24/2016, radiograph 10/17/2016 FINDINGS: Hyperinflation with emphysematous disease. Stable pleural and parenchymal scarring and surgical changes on the right. Small nodular suprahilar and upper lung, new since 10/17/2016 radiograph. Normal cardiomediastinal silhouette with aortic atherosclerosis. No pneumothorax. Degenerative changes of the spine. IMPRESSION: No active cardiopulmonary disease. Nodular opacities in the right upper lung, possible evolution of surgical changes. Electronically Signed   By: Donavan Foil M.D.   On: 06/13/2017 21:49   US Renal  Result Date: 06/13/2017 CLINICAL DATA:  Acute kidney injury. History of nephrolithiasis, benign prostatic hypertrophy and hypertension. EXAM: RENAL / URINARY TRACT ULTRASOUND COMPLETE COMPARISON:  CT abdomen and pelvis June 13, 2017 FINDINGS: Right Kidney: Length: 11 cm. Mildly increased cortical echogenicity. No suspicious mass or hydronephrosis visualized. 18 mm anechoic cyst upper pole with increased through transmission. Left Kidney: Length: 11.2 cm. Mildly increased cortical echogenicity. No suspicious mass or hydronephrosis visualized. 18 mm anechoic cyst upper pole with increased through transmission. Known nephrolithiasis not apparent by routine sonography. Bladder: Appears normal for degree of bladder distention. Prostate measures 4.4 x 3.5 x 5.5 cm. IMPRESSION: Mildly echogenic kidneys compatible with medical renal disease. No obstructive uropathy. Electronically Signed   By: Elon Alas M.D.   On: 06/13/2017 22:21     Subjective: No new complaints.  Discharge Exam: Vitals:   06/28/17 2120 06/29/17 0524  BP: 117/83 (!) 142/87  Pulse: 82 87  Resp: 16 17  Temp: 98.5 F (36.9 C) 98.6 F (37 C)  SpO2: 100% 99%   Vitals:    06/28/17 1438 06/28/17 2120 06/29/17 0524 06/29/17 0800  BP: (!) 141/85 117/83 (!) 142/87   Pulse: 97 82 87   Resp: 18 16 17    Temp: 99.6 F (37.6 C) 98.5 F (36.9 C) 98.6 F (37 C)   TempSrc: Oral Oral Oral   SpO2: 100% 100% 99%   Weight:    72.2 kg (159 lb 2.8 oz)  Height:        General: Pt is alert, awake, not in acute distress Cardiovascular: RRR, S1/S2 +, no rubs, no gallops Respiratory: CTA bilaterally, no wheezing, no rhonchi Abdominal: Soft, NT, ND, bowel sounds + Extremities: no edema, no cyanosis    The results of significant diagnostics from this hospitalization (including imaging, microbiology, ancillary and laboratory) are listed below for reference.     Microbiology: No results found for this or any previous visit (from the past 240 hour(s)).   Labs: BNP (last 3 results) Recent Labs    07/24/16 0326  BNP 017.5*   Basic Metabolic Panel: Recent Labs  Lab 06/25/17 0415 06/26/17 0405 06/27/17 0357 06/28/17 0328 06/29/17 0353  NA 145 145 140  140 140  K 4.2 4.2 4.1 4.2 4.2  CL 103 107 107 108 112*  CO2 28 26 23  20* 18*  GLUCOSE 85 85 84 87 87  BUN 63* 58* 58* 56* 57*  CREATININE 8.38* 7.59* 7.43* 7.38* 6.72*  CALCIUM 8.3* 8.7* 8.5* 8.8* 8.3*  PHOS 3.2 3.8 4.1 4.7* 4.1   Liver Function Tests: Recent Labs  Lab 06/25/17 0415 06/26/17 0405 06/27/17 0357 06/28/17 0328 06/29/17 0353  AST  --   --  19  --   --   ALT  --   --  17  --   --   ALKPHOS  --   --  37*  --   --   BILITOT  --   --  0.7  --   --   PROT  --   --  6.8  --   --   ALBUMIN 2.8* 2.9* 3.1*  3.0* 2.8* 3.0*   No results for input(s): LIPASE, AMYLASE in the last 168 hours. No results for input(s): AMMONIA in the last 168 hours. CBC: Recent Labs  Lab 06/24/17 0349 06/25/17 0415 06/26/17 0405 06/27/17 0357 06/28/17 0328  WBC 8.2 8.1 8.0 8.7 9.0  HGB 7.8* 7.6* 7.6* 7.9* 7.6*  HCT 23.3* 22.6* 22.8* 23.6* 22.9*  MCV 90.7 91.5 92.7 92.2 92.3  PLT 160 177 165 185 177    Cardiac Enzymes: No results for input(s): CKTOTAL, CKMB, CKMBINDEX, TROPONINI in the last 168 hours. BNP: Invalid input(s): POCBNP CBG: No results for input(s): GLUCAP in the last 168 hours. D-Dimer No results for input(s): DDIMER in the last 72 hours. Hgb A1c No results for input(s): HGBA1C in the last 72 hours. Lipid Profile No results for input(s): CHOL, HDL, LDLCALC, TRIG, CHOLHDL, LDLDIRECT in the last 72 hours. Thyroid function studies No results for input(s): TSH, T4TOTAL, T3FREE, THYROIDAB in the last 72 hours.  Invalid input(s): FREET3 Anemia work up No results for input(s): VITAMINB12, FOLATE, FERRITIN, TIBC, IRON, RETICCTPCT in the last 72 hours. Urinalysis    Component Value Date/Time   COLORURINE STRAW (A) 06/28/2017 1245   APPEARANCEUR CLEAR 06/28/2017 1245   LABSPEC 1.008 06/28/2017 1245   PHURINE 7.0 06/28/2017 1245   GLUCOSEU NEGATIVE 06/28/2017 1245   GLUCOSEU NEGATIVE 12/20/2016 1450   HGBUR SMALL (A) 06/28/2017 1245   BILIRUBINUR NEGATIVE 06/28/2017 1245   BILIRUBINUR negative 08/26/2015 0858   KETONESUR NEGATIVE 06/28/2017 1245   PROTEINUR NEGATIVE 06/28/2017 1245   UROBILINOGEN 0.2 12/20/2016 1450   NITRITE NEGATIVE 06/28/2017 1245   LEUKOCYTESUR NEGATIVE 06/28/2017 1245   Sepsis Labs Invalid input(s): PROCALCITONIN,  WBC,  LACTICIDVEN Microbiology No results found for this or any previous visit (from the past 240 hour(s)).   Time coordinating discharge: Over 35 minutes  SIGNED:   [name], MD  [facility] 06/29/2017, 1:23 PM Pager   If 7PM-7AM, please contact night-coverage [email or web address] Password [email or web address]

## 2017-06-29 NOTE — Progress Notes (Signed)
Per MD request, INR check appointment made for pt at Dr.John's office for Tuesday May 14th at 1:15pm. Pt made aware and info placed on AVS. Marney Doctor RN,BSN,NCM [phone]

## 2017-06-29 NOTE — Progress Notes (Signed)
S:Feels well and is without complaints.  O:BP (!) 142/87 (BP Location: Left Arm)   Pulse 87   Temp 98.6 F (37 C) (Oral)   Resp 17   Ht 6\' 4"  (1.93 m)   Wt 72.2 kg (159 lb 2.8 oz)   SpO2 99%   BMI 19.38 kg/m   Intake/Output Summary (Last 24 hours) at 06/29/2017 1208 Last data filed at 06/29/2017 0525 Gross per 24 hour  Intake 3712 ml  Output 2595 ml  Net 1117 ml   Intake/Output: I/O last 3 completed shifts: In: 1497 [P.O.:2500; I.V.:3062] Out: 0263 [Urine:4115]  Intake/Output this shift:  No intake/output data recorded. Weight change:  Gen: NAD CVS: no rub Resp: cta Abd: benign Ext: no edema  Recent Labs  Lab 06/23/17 0339 06/24/17 0349 06/25/17 0415 06/26/17 0405 06/27/17 0357 06/28/17 0328 06/29/17 0353  NA 140 145 145 145 140 140 140  K 3.8 3.7 4.2 4.2 4.1 4.2 4.2  CL 109 108 103 107 107 108 112*  CO2 16* 25 28 26 23  20* 18*  GLUCOSE 90 87 85 85 84 87 87  BUN 73* 66* 63* 58* 58* 56* 57*  CREATININE 10.20* 8.92* 8.38* 7.59* 7.43* 7.38* 6.72*  ALBUMIN 2.8* 2.8* 2.8* 2.9* 3.1*  3.0* 2.8* 3.0*  CALCIUM 8.5* 8.1* 8.3* 8.7* 8.5* 8.8* 8.3*  PHOS 5.7* 4.5 3.2 3.8 4.1 4.7* 4.1  AST  --   --   --   --  19  --   --   ALT  --   --   --   --  17  --   --    Liver Function Tests: Recent Labs  Lab 06/27/17 0357 06/28/17 0328 06/29/17 0353  AST 19  --   --   ALT 17  --   --   ALKPHOS 37*  --   --   BILITOT 0.7  --   --   PROT 6.8  --   --   ALBUMIN 3.1*  3.0* 2.8* 3.0*   No results for input(s): LIPASE, AMYLASE in the last 168 hours. No results for input(s): AMMONIA in the last 168 hours. CBC: Recent Labs  Lab 06/24/17 0349 06/25/17 0415 06/26/17 0405 06/27/17 0357 06/28/17 0328  WBC 8.2 8.1 8.0 8.7 9.0  HGB 7.8* 7.6* 7.6* 7.9* 7.6*  HCT 23.3* 22.6* 22.8* 23.6* 22.9*  MCV 90.7 91.5 92.7 92.2 92.3  PLT 160 177 165 185 177   Cardiac Enzymes: No results for input(s): CKTOTAL, CKMB, CKMBINDEX, TROPONINI in the last 168 hours. CBG: No results for  input(s): GLUCAP in the last 168 hours.  Iron Studies: No results for input(s): IRON, TIBC, TRANSFERRIN, FERRITIN in the last 72 hours. Studies/Results: No results found. Marland Kitchen amLODipine  10 mg Oral Daily  . multivitamin with minerals  1 tablet Oral Daily  . thiamine  100 mg Oral Daily   Or  . thiamine  100 mg Intravenous Daily  . warfarin   Does not apply Once  . Warfarin - Pharmacist Dosing Inpatient   Does not apply q1800    BMET    Component Value Date/Time   NA 140 06/29/2017 0353   K 4.2 06/29/2017 0353   CL 112 (H) 06/29/2017 0353   CO2 18 (L) 06/29/2017 0353   GLUCOSE 87 06/29/2017 0353   BUN 57 (H) 06/29/2017 0353   CREATININE 6.72 (H) 06/29/2017 0353   CALCIUM 8.3 (L) 06/29/2017 0353   GFRNONAA 8 (L) 06/29/2017 0353   GFRAA 9 (  L) 06/29/2017 0353   CBC    Component Value Date/Time   WBC 9.0 06/28/2017 0328   RBC 2.48 (L) 06/28/2017 0328   HGB 7.6 (L) 06/28/2017 0328   HCT 22.9 (L) 06/28/2017 0328   PLT 177 06/28/2017 0328   MCV 92.3 06/28/2017 0328   MCH 30.6 06/28/2017 0328   MCHC 33.2 06/28/2017 0328   RDW 16.6 (H) 06/28/2017 0328   LYMPHSABS 2.2 06/20/2017 0336   MONOABS 0.8 06/20/2017 0336   EOSABS 0.1 06/20/2017 0336   BASOSABS 0.1 06/20/2017 0336      Assessment/Plan:  1. AKI/CKD stage 3- acute onset with marked increase in Scr and history of worsening lower extremity edema, dizziness, and unsteady gait. Also with new onset of anemia also noted to have some microscopic hematuria and subnephrotic proteinuria. Presumably due to ischemic atn in setting of relative hypotension/hypovolemia with concomitant ACE-inhibition, however will need to r/o myeloma given presentation (SPEP was negative for M spike and normal ifx patter on UPEP). Renal US ordered 06/13/17 without obstruction. GN workupnegative ANA, dsDNA, ASO, ANCA, and normal complements. Negative HIV/hepB/Hep C.  1. Renal function continues to improve. 2. F/u with PCP on Tuesday to recheck INR  and renal function.  Appointment with Dr. Florene Glen on 07/10/17 at 1:30pm at our office (pt was given our business card with address and phone number). 2. Metabolic acidosis- improved with IV bicarb  3. Symptomatic anemia- EGD on 06/15/17 without evidence of GIB. As above r/o myeloma s/p transfusion of 2 units PRBC's.  4. Abnormal LFT's consistent with shock liver per GI. Need to recheck in am.  5. H/o etoh abuse, recent cessation 6. COPD- stable 7. HTN- stable, avoid ACE/ARB for now 8. Hypoalbuminemia- possibly related to liver disease and r/o myeloma as above 9. H/o pulmonary embolus 2018wason xareltonow on coumadin 10. H/o CVA 11. Disposition- stable for discharge home with f/u as above.    Charles Potts, MD [employer] [phone]

## 2017-07-03 ENCOUNTER — Ambulatory Visit (INDEPENDENT_AMBULATORY_CARE_PROVIDER_SITE_OTHER): Payer: 59 | Admitting: General Practice

## 2017-07-03 ENCOUNTER — Ambulatory Visit: Payer: 59

## 2017-07-03 DIAGNOSIS — Z7901 Long term (current) use of anticoagulants: Secondary | ICD-10-CM

## 2017-07-03 LAB — POCT INR: INR: 2.9

## 2017-07-03 NOTE — Patient Instructions (Addendum)
Pre visit review using our clinic review tool, if applicable. No additional management support is needed unless otherwise documented below in the visit note.  Please take 2 tablets daily except 1 tablet on Monday and Fridays.  Re-check in 1 week.  A full discussion of the nature of anticoagulants has been carried out.  A benefit risk analysis has been presented to the patient, so that they understand the justification for choosing anticoagulation at this time. The need for frequent and regular monitoring, precise dosage adjustment and compliance is stressed.  Side effects of potential bleeding are discussed.  The patient should avoid any OTC items containing aspirin or ibuprofen, and should avoid great swings in general diet.  Avoid alcohol consumption.  Call if any signs of abnormal bleeding.

## 2017-07-10 ENCOUNTER — Ambulatory Visit (INDEPENDENT_AMBULATORY_CARE_PROVIDER_SITE_OTHER): Payer: 59 | Admitting: General Practice

## 2017-07-10 ENCOUNTER — Other Ambulatory Visit: Payer: Self-pay | Admitting: General Practice

## 2017-07-10 DIAGNOSIS — Z7901 Long term (current) use of anticoagulants: Secondary | ICD-10-CM

## 2017-07-10 DIAGNOSIS — I2699 Other pulmonary embolism without acute cor pulmonale: Secondary | ICD-10-CM

## 2017-07-10 LAB — POCT INR: INR: 3.6 — AB (ref 2.0–3.0)

## 2017-07-10 MED ORDER — WARFARIN SODIUM 5 MG PO TABS: ORAL_TABLET | ORAL | 3 refills | Status: DC

## 2017-07-10 NOTE — Patient Instructions (Addendum)
Pre visit review using our clinic review tool, if applicable. No additional management support is needed unless otherwise documented below in the visit note.  Skip dosage today (5/21) and then change dosage and take  2 tablets daily except 1 tablet on Monday/Wednesdays and Fridays.  Re-check in 1 week.

## 2017-07-11 DIAGNOSIS — Z7901 Long term (current) use of anticoagulants: Secondary | ICD-10-CM | POA: Insufficient documentation

## 2017-07-13 ENCOUNTER — Encounter: Payer: Self-pay | Admitting: Internal Medicine

## 2017-07-13 ENCOUNTER — Other Ambulatory Visit (INDEPENDENT_AMBULATORY_CARE_PROVIDER_SITE_OTHER): Payer: 59

## 2017-07-13 ENCOUNTER — Ambulatory Visit (INDEPENDENT_AMBULATORY_CARE_PROVIDER_SITE_OTHER): Payer: 59 | Admitting: Internal Medicine

## 2017-07-13 VITALS — BP 140/60 | HR 84 | Ht 76.0 in | Wt 175.0 lb

## 2017-07-13 DIAGNOSIS — Z0001 Encounter for general adult medical examination with abnormal findings: Secondary | ICD-10-CM

## 2017-07-13 DIAGNOSIS — Z0000 Encounter for general adult medical examination without abnormal findings: Secondary | ICD-10-CM

## 2017-07-13 DIAGNOSIS — G4700 Insomnia, unspecified: Secondary | ICD-10-CM

## 2017-07-13 DIAGNOSIS — N189 Chronic kidney disease, unspecified: Secondary | ICD-10-CM

## 2017-07-13 DIAGNOSIS — N183 Chronic kidney disease, stage 3 unspecified: Secondary | ICD-10-CM

## 2017-07-13 DIAGNOSIS — D631 Anemia in chronic kidney disease: Secondary | ICD-10-CM | POA: Diagnosis not present

## 2017-07-13 LAB — PSA: PSA: 1.87 ng/mL (ref 0.10–4.00)

## 2017-07-13 LAB — LIPID PANEL
CHOL/HDL RATIO: 4
Cholesterol: 185 mg/dL (ref 0–200)
HDL: 47.4 mg/dL (ref >39.00)
LDL CALC: 105 mg/dL — AB (ref 0–99)
NonHDL: 137.59
Triglycerides: 165 mg/dL — ABNORMAL HIGH (ref 0.0–149.0)
VLDL: 33 mg/dL (ref 0.0–40.0)

## 2017-07-13 LAB — CBC WITH DIFFERENTIAL/PLATELET
Basophils Absolute: 0.1 10*3/uL (ref 0.0–0.1)
Basophils Relative: 0.8 % (ref 0.0–3.0)
Eosinophils Absolute: 0.2 10*3/uL (ref 0.0–0.7)
Eosinophils Relative: 2 % (ref 0.0–5.0)
HCT: 26.7 % — ABNORMAL LOW (ref 39.0–52.0)
Hemoglobin: 9.2 g/dL — ABNORMAL LOW (ref 13.0–17.0)
Lymphocytes Relative: 23.8 % (ref 12.0–46.0)
Lymphs Abs: 2.4 10*3/uL (ref 0.7–4.0)
MCHC: 34.3 g/dL (ref 30.0–36.0)
MCV: 92.5 fl (ref 78.0–100.0)
Monocytes Absolute: 1.4 10*3/uL — ABNORMAL HIGH (ref 0.1–1.0)
Monocytes Relative: 13.9 % — ABNORMAL HIGH (ref 3.0–12.0)
Neutro Abs: 5.9 10*3/uL (ref 1.4–7.7)
Neutrophils Relative %: 59.5 % (ref 43.0–77.0)
Platelets: 231 10*3/uL (ref 150.0–400.0)
RBC: 2.89 Mil/uL — ABNORMAL LOW (ref 4.22–5.81)
RDW: 17.6 % — ABNORMAL HIGH (ref 11.5–15.5)
WBC: 10 10*3/uL (ref 4.0–10.5)

## 2017-07-13 MED ORDER — TEMAZEPAM 15 MG PO CAPS: ORAL_CAPSULE | ORAL | 1 refills | Status: DC

## 2017-07-13 NOTE — Progress Notes (Signed)
Subjective:    Patient ID: Charles [name], male    DOB: December 01, 1950, 67 y.o.   MRN: [id number]  HPI  Here for wellness and f/u;  Overall doing ok;  Pt denies Chest pain, worsening SOB, DOE, wheezing, orthopnea, PND, worsening LE edema, palpitations, dizziness or syncope.  Pt denies neurological change such as new headache, facial or extremity weakness.  Pt denies polydipsia, polyuria, or low sugar symptoms. Pt states overall good compliance with treatment and medications, good tolerability, and has been trying to follow appropriate diet.  Pt denies worsening depressive symptoms, suicidal ideation or panic. No fever, night sweats, wt loss, loss of appetite, or other constitutional symptoms.  Pt states good ability with ADL's, has low fall risk, home safety reviewed and adequate, no other significant changes in hearing or vision, and only occasionally active with exercise. Recently hospd with severe AKI due to ATN, has seen Dr Powell/renal, with Cr reportedly 4.47 x 2 days ago.  Coumadin followed closely per coumadin clinic.  No overt bleeding,  No new complaints or interval hx Past Medical History:  Diagnosis Date  . ABNORMAL ELECTROCARDIOGRAM 11/02/2008  . ABSCESS, LUNG 10/23/2006  . Anemia 05/26/2014  . BACTERIAL PNEUMONIA 12/28/2009  . BPH (benign prostatic hyperplasia) 11/22/2010  . 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 CHEST PAIN 12/24/2009  . Coronary artery calcification seen on CT scan 06/01/2015  . Cramp of limb 07/19/2007  . DIVERTICULOSIS, COLON 03/07/2007  . DVT (deep venous thrombosis) ([place]) 01/2014   LLE  . Emphysema of lung ([place])   . 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/23/2006  . FLANK PAIN, LEFT 02/25/2010  . FREQUENCY, URINARY 12/24/2009  . HEMORRHOIDS 10/23/2006  . HYPERLIPIDEMIA 10/23/2006  . HYPERTENSION 10/23/2006  . PE (pulmonary embolism) 02/2014  . PLANTAR FASCIITIS, LEFT 11/02/2008  . Pneumonia 12/2013 X 2  . PSA, INCREASED 11/20/2007  . PULMONARY NODULE 05/14/2008  . Unspecified Peripheral Vascular  Disease 10/23/2006   Past Surgical History:  Procedure Laterality Date  . ESOPHAGOGASTRODUODENOSCOPY (EGD) WITH PROPOFOL N/A 06/15/2017   Procedure: ESOPHAGOGASTRODUODENOSCOPY (EGD) WITH PROPOFOL;  Surgeon: Jerene Bears, MD;  Location: [facility];  Service: Gastroenterology;  Laterality: N/A;  . HEMORRHOID SURGERY  ?1990's  . INGUINAL HERNIA REPAIR Bilateral ?2000's  . IVC FILTER INSERTION N/A 10/03/2016   Procedure: IVC Filter Retrieveal;  Surgeon: Serafina Mitchell, MD;  Location: [facility];  Service: Cardiovascular;  Laterality: N/A;  . SHOULDER ARTHROSCOPY W/ ROTATOR CUFF REPAIR Right 11/2013  . STAPLING OF BLEBS Right 07/31/2016   Procedure: BLEBECTOMY;  Surgeon: Melrose Nakayama, MD;  Location: [facility];  Service: Thoracic;  Laterality: Right;  . VENA CAVA FILTER PLACEMENT Right 08/04/2016   Procedure: INSERTION VENA-CAVA FILTER;  Surgeon: Melrose Nakayama, MD;  Location: [facility];  Service: Thoracic;  Laterality: Right;  Marland Kitchen VIDEO ASSISTED THORACOSCOPY Right 07/31/2016   Procedure: VIDEO ASSISTED THORACOSCOPY;  Surgeon: Melrose Nakayama, MD;  Location: [facility];  Service: Thoracic;  Laterality: Right;  Marland Kitchen VIDEO ASSISTED THORACOSCOPY Right 08/04/2016   Procedure: REDO VIDEO ASSISTED THORACOSCOPY- EVACUATION OF HEMOTHORAX;  Surgeon: Melrose Nakayama, MD;  Location: [facility];  Service: Thoracic;  Laterality: Right;  Marland Kitchen VIDEO BRONCHOSCOPY WITH ENDOBRONCHIAL NAVIGATION N/A 03/11/2014   Procedure: VIDEO BRONCHOSCOPY WITH ENDOBRONCHIAL NAVIGATION;  Surgeon: Collene Gobble, MD;  Location: [facility];  Service: Thoracic;  Laterality: N/A;    reports that he has quit smoking. His smoking use included cigarettes. He has a 15.00 pack-year smoking history. He has never  used smokeless tobacco. He reports that he drinks alcohol. He reports that he has current or past drug history. Drug: Marijuana. family history includes Heart disease in his father and mother; Kidney disease in his sister. No Known  Allergies Current Outpatient Medications on File Prior to Visit  Medication Sig Dispense Refill  . amLODipine (NORVASC) 10 MG tablet Take 1 tablet (10 mg total) by mouth daily. 90 tablet 3  . ezetimibe (ZETIA) 10 MG tablet Take 1 tablet (10 mg total) by mouth daily. 90 tablet 3  . rosuvastatin (CRESTOR) 40 MG tablet TAKE 1 TABLET DAILY 90 tablet 3  . warfarin (COUMADIN) 5 MG tablet Take 2 tablets daily except 1 tablet on Mon/Wed/Fri. Or AS DIRECTED BY ANTICOAGULATION CLINIC 45 tablet 3  . tadalafil (CIALIS) 20 MG tablet Take 1 tablet (20 mg total) by mouth daily as needed for erectile dysfunction. 10 tablet 11   No current facility-administered medications on file prior to visit.     Review of Systems Constitutional: Negative for other unusual diaphoresis, sweats, appetite or weight changes HENT: Negative for other worsening hearing loss, ear pain, facial swelling, mouth sores or neck stiffness.   Eyes: Negative for other worsening pain, redness or other visual disturbance.  Respiratory: Negative for other stridor or swelling Cardiovascular: Negative for other palpitations or other chest pain  Gastrointestinal: Negative for worsening diarrhea or loose stools, blood in stool, distention or other pain Genitourinary: Negative for hematuria, flank pain or other change in urine volume.  Musculoskeletal: Negative for myalgias or other joint swelling.  Skin: Negative for other color change, or other wound or worsening drainage.  Neurological: Negative for other syncope or numbness. Hematological: Negative for other adenopathy or swelling Psychiatric/Behavioral: Negative for hallucinations, other worsening agitation, SI, self-injury, or new decreased concentration All other system neg per pt    Objective:   Physical Exam BP 140/60 (BP Location: Left Arm, Patient Position: Sitting, Cuff Size: Normal)   Pulse 84   Ht 6\' 4"  (1.93 m)   Wt 175 lb (79.4 kg)   SpO2 98%   BMI 21.30 kg/m  VS  noted,  Constitutional: Pt is oriented to person, place, and time. Appears well-developed and well-nourished, in no significant distress and comfortable Head: Normocephalic and atraumatic  Eyes: Conjunctivae and EOM are normal. Pupils are equal, round, and reactive to light Right Ear: External ear normal without discharge Left Ear: External ear normal without discharge Nose: Nose without discharge or deformity Mouth/Throat: Oropharynx is without other ulcerations and moist  Neck: Normal range of motion. Neck supple. No JVD present. No tracheal deviation present or significant neck LA or mass Cardiovascular: Normal rate, regular rhythm, normal heart sounds and intact distal pulses.   Pulmonary/Chest: WOB normal and breath sounds without rales or wheezing  Abdominal: Soft. Bowel sounds are normal. NT. No HSM  Musculoskeletal: Normal range of motion. Exhibits no edema Lymphadenopathy: Has no other cervical adenopathy.  Neurological: Pt is alert and oriented to person, place, and time. Pt has normal reflexes. No cranial nerve deficit. Motor grossly intact, Gait intact Skin: Skin is warm and dry. No rash noted or new ulcerations Psychiatric:  Has normal mood and affect. Behavior is normal without agitation No other exam findings       Assessment & Plan:

## 2017-07-13 NOTE — Patient Instructions (Signed)
Please take all new medication as prescribed - the sleeping medication  Please continue all other medications as before, and refills have been done if requested.  Please have the pharmacy call with any other refills you may need.  Please continue your efforts at being more active, low cholesterol diet, and weight control.  You are otherwise up to date with prevention measures today.  Please keep your appointments with your specialists as you may have planned  Please go to the LAB in the Basement (turn left off the elevator) for the tests to be done today  You will be contacted by phone if any changes need to be made immediately.  Otherwise, you will receive a letter about your results with an explanation, but please check with MyChart first.  Please remember to sign up for MyChart if you have not done so, as this will be important to you in the future with finding out test results, communicating by private email, and scheduling acute appointments online when needed.  Please return in 6 months, or sooner if needed, with Lab testing done 3-5 days before

## 2017-07-14 NOTE — Assessment & Plan Note (Signed)
With recent acute flare, to cont renal f/u as planned

## 2017-07-14 NOTE — Assessment & Plan Note (Signed)
Cont med refill,  to f/u any worsening symptoms or concerns

## 2017-07-14 NOTE — Assessment & Plan Note (Signed)
For f/u cbc with labs

## 2017-07-14 NOTE — Assessment & Plan Note (Signed)

## 2017-07-17 ENCOUNTER — Ambulatory Visit (INDEPENDENT_AMBULATORY_CARE_PROVIDER_SITE_OTHER): Payer: 59 | Admitting: General Practice

## 2017-07-17 DIAGNOSIS — I2699 Other pulmonary embolism without acute cor pulmonale: Secondary | ICD-10-CM

## 2017-07-17 DIAGNOSIS — Z7901 Long term (current) use of anticoagulants: Secondary | ICD-10-CM | POA: Diagnosis not present

## 2017-07-17 LAB — POCT INR: INR: 2.3 (ref 2.0–3.0)

## 2017-07-17 NOTE — Patient Instructions (Addendum)
Pre visit review using our clinic review tool, if applicable. No additional management support is needed unless otherwise documented below in the visit note.  Continue to  2 tablets daily except 1 tablet on Monday/Wednesdays and Fridays.  Re-check in 1 week.

## 2017-07-24 ENCOUNTER — Other Ambulatory Visit: Payer: Self-pay | Admitting: General Practice

## 2017-07-24 ENCOUNTER — Ambulatory Visit (INDEPENDENT_AMBULATORY_CARE_PROVIDER_SITE_OTHER): Payer: 59 | Admitting: General Practice

## 2017-07-24 DIAGNOSIS — Z7901 Long term (current) use of anticoagulants: Secondary | ICD-10-CM | POA: Diagnosis not present

## 2017-07-24 DIAGNOSIS — I2699 Other pulmonary embolism without acute cor pulmonale: Secondary | ICD-10-CM

## 2017-07-24 LAB — POCT INR: INR: 3 (ref 2.0–3.0)

## 2017-07-24 MED ORDER — WARFARIN SODIUM 5 MG PO TABS: ORAL_TABLET | ORAL | 3 refills | Status: DC

## 2017-07-24 NOTE — Patient Instructions (Signed)
Pre visit review using our clinic review tool, if applicable. No additional management support is needed unless otherwise documented below in the visit note.  Take 1 tablet today and then continue to  2 tablets daily except 1 tablet on Monday/Wednesdays and Fridays.  Re-check in 2 weeks.

## 2017-08-07 ENCOUNTER — Ambulatory Visit (INDEPENDENT_AMBULATORY_CARE_PROVIDER_SITE_OTHER): Payer: 59 | Admitting: General Practice

## 2017-08-07 DIAGNOSIS — Z7901 Long term (current) use of anticoagulants: Secondary | ICD-10-CM

## 2017-08-07 DIAGNOSIS — I2699 Other pulmonary embolism without acute cor pulmonale: Secondary | ICD-10-CM

## 2017-08-07 LAB — POCT INR: INR: 3.1 — AB (ref 2.0–3.0)

## 2017-08-07 NOTE — Patient Instructions (Addendum)
Pre visit review using our clinic review tool, if applicable. No additional management support is needed unless otherwise documented below in the visit note.  Take 1 tablet today and then continue to  2 tablets daily except 1 tablet on Monday/Wednesdays and Fridays.  Re-check in 3 weeks.

## 2017-08-27 ENCOUNTER — Telehealth: Payer: Self-pay

## 2017-08-27 NOTE — Telephone Encounter (Signed)
-----   Message from Algernon Huxley, RN sent at 08/27/2017  9:53 AM EDT ----- Regarding: Hospital follow-up Hey Shaylene Paganelli this pt needs a hospital follow-up appt with Dr. Ardis Hughs per Dr. Hilarie Fredrickson. Not sure where to put him on [facility] schedule.  Thanks, Vaughan Basta ----- Message ----- From: Larina Bras, CMA Sent: 08/27/2017   8:22 AM To: Algernon Huxley, RN    ----- Message ----- From: Jerene Bears, MD Sent: 08/25/2017   8:20 PM To: Larina Bras, CMA  If a Ardis Hughs patient, then followup with him Thanks JMP  ----- Message ----- From: Larina Bras, CMA Sent: 08/22/2017   1:34 PM To: Jerene Bears, MD  Looks like you asked for this patient to be placed on your schedule for 09/10/17 as a follow up from hospital EGD. However, looks like they are originally a [employer] patient. Do you want them on your schedule still or do they need to be on [facility] schedule?

## 2017-08-27 NOTE — Telephone Encounter (Signed)
The pt was called and notified of the appt cancellation with Dr Hilarie Fredrickson and rescheduled to Dr Ardis Hughs for 9/25 at 9 am.  The pt was offered an earlier appt but declined.  He states he is doing very well and does not want to follow up at this time.

## 2017-08-28 ENCOUNTER — Ambulatory Visit (INDEPENDENT_AMBULATORY_CARE_PROVIDER_SITE_OTHER): Payer: Medicare Other | Admitting: General Practice

## 2017-08-28 DIAGNOSIS — I2699 Other pulmonary embolism without acute cor pulmonale: Secondary | ICD-10-CM

## 2017-08-28 DIAGNOSIS — Z7901 Long term (current) use of anticoagulants: Secondary | ICD-10-CM

## 2017-08-28 LAB — POCT INR: INR: 3.4 — AB (ref 2.0–3.0)

## 2017-08-28 NOTE — Patient Instructions (Addendum)
Pre visit review using our clinic review tool, if applicable. No additional management support is needed unless otherwise documented below in the visit note.  Hold coumadin today and then take 1 tablet daily except 2 tablets on Sunday/Tuesday and Thursdays.  Re-check in 3 weeks.

## 2017-09-10 ENCOUNTER — Ambulatory Visit: Payer: 59 | Admitting: Internal Medicine

## 2017-09-18 ENCOUNTER — Ambulatory Visit (INDEPENDENT_AMBULATORY_CARE_PROVIDER_SITE_OTHER): Payer: Medicare Other | Admitting: General Practice

## 2017-09-18 DIAGNOSIS — Z7901 Long term (current) use of anticoagulants: Secondary | ICD-10-CM

## 2017-09-18 DIAGNOSIS — I2699 Other pulmonary embolism without acute cor pulmonale: Secondary | ICD-10-CM

## 2017-09-18 LAB — POCT INR: INR: 2.2 (ref 2.0–3.0)

## 2017-09-18 NOTE — Patient Instructions (Signed)
Pre visit review using our clinic review tool, if applicable. No additional management support is needed unless otherwise documented below in the visit note.  Continue to take 1 tablet daily except 2 tablets on Sunday/Tuesday and Thursdays.  Re-check in 4 weeks.

## 2017-10-11 ENCOUNTER — Ambulatory Visit: Payer: Medicare Other | Admitting: Emergency Medicine

## 2017-10-19 ENCOUNTER — Ambulatory Visit (INDEPENDENT_AMBULATORY_CARE_PROVIDER_SITE_OTHER): Payer: Medicare Other | Admitting: General Practice

## 2017-10-19 DIAGNOSIS — I2699 Other pulmonary embolism without acute cor pulmonale: Secondary | ICD-10-CM

## 2017-10-19 DIAGNOSIS — Z7901 Long term (current) use of anticoagulants: Secondary | ICD-10-CM | POA: Diagnosis not present

## 2017-10-19 LAB — POCT INR: INR: 2.7 (ref 2.0–3.0)

## 2017-10-19 NOTE — Patient Instructions (Addendum)
Pre visit review using our clinic review tool, if applicable. No additional management support is needed unless otherwise documented below in the visit note.  Continue to take 1 tablet daily except 2 tablets on Sunday/Tuesday and Thursdays.  Re-check in 4 weeks.

## 2017-11-06 ENCOUNTER — Ambulatory Visit: Payer: 59 | Admitting: Gastroenterology

## 2017-11-14 ENCOUNTER — Ambulatory Visit: Payer: 59 | Admitting: Gastroenterology

## 2017-11-22 ENCOUNTER — Encounter: Payer: Self-pay | Admitting: Emergency Medicine

## 2017-11-22 ENCOUNTER — Ambulatory Visit (INDEPENDENT_AMBULATORY_CARE_PROVIDER_SITE_OTHER): Payer: Medicare Other | Admitting: Emergency Medicine

## 2017-11-22 DIAGNOSIS — J449 Chronic obstructive pulmonary disease, unspecified: Secondary | ICD-10-CM | POA: Diagnosis not present

## 2017-11-22 DIAGNOSIS — I2699 Other pulmonary embolism without acute cor pulmonale: Secondary | ICD-10-CM

## 2017-11-22 DIAGNOSIS — R918 Other nonspecific abnormal finding of lung field: Secondary | ICD-10-CM | POA: Diagnosis not present

## 2017-11-22 DIAGNOSIS — J301 Allergic rhinitis due to pollen: Secondary | ICD-10-CM

## 2017-11-22 MED ORDER — ALBUTEROL SULFATE HFA 108 (90 BASE) MCG/ACT IN AERS: 2.0000 | INHALATION_SPRAY | RESPIRATORY_TRACT | 5 refills | Status: DC | PRN

## 2017-11-22 NOTE — Assessment & Plan Note (Signed)
We do not need to repeat your CT scan of the chest unless you have a clinical change.

## 2017-11-22 NOTE — Assessment & Plan Note (Signed)
Mild obstruction.  He is having some dyspnea but I suspect that this is mainly due to some deconditioning and weight gain as opposed to COPD.  He is having some slight cough.  We will do a trial of introducing albuterol for him to use as needed, possibly pretreat exercise  Agree with working on increasing exercise, conditioning and some slow steady weight loss. We will try using albuterol 2 puffs if needed for shortness of breath, coughing, chest tightness.  You can try using this 10 to 15 minutes before exercise to see if it makes exertion easier. You may benefit from the flu shot. Let us know if you decide you would like to get it.

## 2017-11-22 NOTE — Patient Instructions (Signed)
Agree with working on increasing exercise, conditioning and some slow steady weight loss. We will try using albuterol 2 puffs if needed for shortness of breath, coughing, chest tightness.  You can try using this 10 to 15 minutes before exercise to see if it makes exertion easier. You may benefit from the flu shot. Let us know if you decide you would like to get it.  If your cough continues you may want to try starting an over-the-counter allergy medication such as loratadine (generic Claritin) 10 mg daily. Please continue your Coumadin as directed We do not need to repeat your CT scan of the chest unless you have a clinical change. Follow with Dr Lamonte Sakai in 12 months or sooner if you have any problems

## 2017-11-22 NOTE — Progress Notes (Signed)
Subjective:    Patient ID: Charles [name], male    DOB: [birth year], 67 y.o.   MRN: [id number]  HPI  ROV 11/23/16 -- this is a follow-up visit for patient with a history of COPD, pulmonary embolism on Xarelto. We have biopsied a left lower lobe spiculated nodule, pathology negative. We have subsequently been following with serial CT scans of the chest. Most recent scan 10/24/16 that I have personally reviewed. This shows stable appearance of left lower lobe spiculated area, probably scar. Left upper lobe irregular density has resolved. No evidence of malignancy. He was admitted in June for spontaneous R Ptx, required VATS. He believes that his breathing is doing well. Not on BD's. He is interested in LDCT, but he quit smoking about 27 yrs ago.   ROV 11/22/17 --this is an annual follow-up visit for Charles [name] who is 67, has a history of COPD, pulmonary embolism with an IVCF, left lower lobe spiculated nodule that was biopsy negative and subsequently stable on follow-up CT chest 10/24/2016.  Also with a history of spontaneous right-sided pneumothorax that required VATS. He was admitted in 06/2017 for dehydration, renal failure. He was able to avoid HD. His xarelto was changed to coumadin. He developed a slight cough 2 months ago, prod of some yellow. No HA, denies any PND, sneezing or GERD sx. He is not on any BD's. He has some exertional SOB - has gained about 20+ lbs since last time. He is not very active.     Review of Systems  Constitutional: Negative for appetite change, fever and unexpected weight change.  HENT: Negative for congestion, dental problem, ear pain, nosebleeds, postnasal drip, rhinorrhea, sinus pressure, sneezing, sore throat and trouble swallowing.   Eyes: Negative for redness and itching.  Respiratory: Negative for cough, chest tightness, shortness of breath and wheezing.   Cardiovascular: Negative for palpitations and leg swelling.  Gastrointestinal: Negative for nausea and vomiting.    Genitourinary: Negative for dysuria.  Musculoskeletal: Negative for joint swelling.  Skin: Negative for rash.  Neurological: Negative for headaches.  Hematological: Does not bruise/bleed easily.  Psychiatric/Behavioral: Negative for dysphoric mood. The patient is not nervous/anxious.     Past Medical History:  Diagnosis Date  . ABNORMAL ELECTROCARDIOGRAM 11/02/2008  . ABSCESS, LUNG 10/23/2006  . Anemia 05/26/2014  . BACTERIAL PNEUMONIA 12/28/2009  . BPH (benign prostatic hyperplasia) 11/22/2010  . 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 CHEST PAIN 12/24/2009  . Coronary artery calcification seen on CT scan 06/01/2015  . Cramp of limb 07/19/2007  . DIVERTICULOSIS, COLON 03/07/2007  . DVT (deep venous thrombosis) ([place]) 01/2014   LLE  . Emphysema of lung ([place])   . 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/23/2006  . FLANK PAIN, LEFT 02/25/2010  . FREQUENCY, URINARY 12/24/2009  . HEMORRHOIDS 10/23/2006  . HYPERLIPIDEMIA 10/23/2006  . HYPERTENSION 10/23/2006  . PE (pulmonary embolism) 02/2014  . PLANTAR FASCIITIS, LEFT 11/02/2008  . Pneumonia 12/2013 X 2  . PSA, INCREASED 11/20/2007  . PULMONARY NODULE 05/14/2008  . Unspecified Peripheral Vascular Disease 10/23/2006     Family History  Problem Relation Age of Onset  . Heart disease Mother   . Heart disease Father   . Kidney disease Sister        Renal transplant  . Colon cancer Neg Hx      Social History   Socioeconomic History  . Marital status: Married    Spouse name: Not on file  . Number of children: 2  . Years of education: Not  on file  . Highest education level: Not on file  Occupational History  . Occupation: retired  Scientific laboratory technician  . Financial resource strain: Not on file  . Food insecurity:    Worry: Not on file    Inability: Not on file  . Transportation needs:    Medical: Not on file    Non-medical: Not on file  Tobacco Use  . Smoking status: Former Smoker    Packs/day: 1.00    Years: 15.00    Pack years: 15.00    Types: Cigarettes  .  Smokeless tobacco: Never Used  . Tobacco comment: "quit smoking cigarettes in the 1990's"  Substance and Sexual Activity  . Alcohol use: Yes    Alcohol/week: 0.0 standard drinks    Comment: patient states he stopped 4-5 days ago  . 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 Sexual activity: Yes  Lifestyle  . Physical activity:    Days per week: Not on file    Minutes per session: Not on file  . Stress: Not on file  Relationships  . Social connections:    Talks on phone: Not on file    Gets together: Not on file    Attends religious service: Not on file    Active member of club or organization: Not on file    Attends meetings of clubs or organizations: Not on file    Relationship status: Not on file  . Intimate partner violence:    Fear of current or ex partner: Not on file    Emotionally abused: Not on file    Physically abused: Not on file    Forced sexual activity: Not on file  Other Topics Concern  . Not on file  Social History Narrative  . Not on file     No Known Allergies   Outpatient Medications Prior to Visit  Medication Sig Dispense Refill  . amLODipine (NORVASC) 10 MG tablet Take 1 tablet (10 mg total) by mouth daily. 90 tablet 3  . ezetimibe (ZETIA) 10 MG tablet Take 1 tablet (10 mg total) by mouth daily. 90 tablet 3  . rosuvastatin (CRESTOR) 40 MG tablet TAKE 1 TABLET DAILY 90 tablet 3  . temazepam (RESTORIL) 15 MG capsule 1-2 tab by mouth at bedtime as needed for sleep 60 capsule 1  . warfarin (COUMADIN) 5 MG tablet Take 2 tablets daily except 1 tablet on Mon/Wed/Fri. Or AS DIRECTED BY ANTICOAGULATION CLINIC 50 tablet 3  . tadalafil (CIALIS) 20 MG tablet Take 1 tablet (20 mg total) by mouth daily as needed for erectile dysfunction. 10 tablet 11   No facility-administered medications prior to visit.          Objective:   Physical Exam Vitals:   11/22/17 0900  BP: 126/78  Pulse: 84  SpO2: 97%  Weight: 210 lb (95.3 kg)  Height: 6'  4" (1.93 m)   Gen: Pleasant, thin, in no distress,  normal affect  ENT: No lesions,  mouth clear,  oropharynx clear, no postnasal drip  Neck: No JVD, no stridor  Lungs: No use of accessory muscles, clear without rales or rhonchi  Cardiovascular: RRR, heart sounds normal, no murmur or gallops, no peripheral edema  Musculoskeletal: No deformities, no cyanosis or clubbing  Neuro: alert, non focal  Skin: Warm, no lesions or rashes      Assessment & Plan:  PE (pulmonary thromboembolism) ([place]) With an IVC filter.  He was changed from  Xarelto to Coumadin after he was hospitalized with acute renal failure.  Plan to continue same  COPD (chronic obstructive pulmonary disease) ([facility]) Mild obstruction.  He is having some dyspnea but I suspect that this is mainly due to some deconditioning and weight gain as opposed to COPD.  He is having some slight cough.  We will do a trial of introducing albuterol for him to use as needed, possibly pretreat exercise  Agree with working on increasing exercise, conditioning and some slow steady weight loss. We will try using albuterol 2 puffs if needed for shortness of breath, coughing, chest tightness.  You can try using this 10 to 15 minutes before exercise to see if it makes exertion easier. You may benefit from the flu shot. Let us know if you decide you would like to get it.    Allergic rhinitis He denies any increase in rhinitis symptoms but he has had some increased cough with some yellowish sputum.  If your cough continues you may want to try starting an over-the-counter allergy medication such as loratadine (generic Claritin) 10 mg daily.  Multiple nodules of lung We do not need to repeat your CT scan of the chest unless you have a clinical change.  Baltazar Apo, MD, PhD 11/22/2017, 9:23 AM [facility] [phone] or if no answer [phone]

## 2017-11-22 NOTE — Assessment & Plan Note (Signed)
He denies any increase in rhinitis symptoms but he has had some increased cough with some yellowish sputum.  If your cough continues you may want to try starting an over-the-counter allergy medication such as loratadine (generic Claritin) 10 mg daily.

## 2017-11-22 NOTE — Assessment & Plan Note (Signed)
With an IVC filter.  He was changed from Xarelto to Coumadin after he was hospitalized with acute renal failure.  Plan to continue same

## 2017-11-23 ENCOUNTER — Ambulatory Visit (INDEPENDENT_AMBULATORY_CARE_PROVIDER_SITE_OTHER): Payer: Medicare Other | Admitting: General Practice

## 2017-11-23 DIAGNOSIS — Z7901 Long term (current) use of anticoagulants: Secondary | ICD-10-CM

## 2017-11-23 DIAGNOSIS — I2699 Other pulmonary embolism without acute cor pulmonale: Secondary | ICD-10-CM

## 2017-11-23 LAB — POCT INR: INR: 3 (ref 2.0–3.0)

## 2017-11-23 NOTE — Patient Instructions (Addendum)
Pre visit review using our clinic review tool, if applicable. No additional management support is needed unless otherwise documented below in the visit note.  Hold dosage today and then continue to take 1 tablet daily except 2 tablets on Sunday/Tuesday and Thursdays.  Re-check in 4 weeks.

## 2017-11-23 NOTE — Progress Notes (Signed)
Agree with management.  Stacy J Burns, MD  

## 2017-12-18 ENCOUNTER — Ambulatory Visit (INDEPENDENT_AMBULATORY_CARE_PROVIDER_SITE_OTHER): Payer: Medicare Other | Admitting: Family

## 2017-12-18 ENCOUNTER — Encounter: Payer: Self-pay | Admitting: Family

## 2017-12-18 ENCOUNTER — Other Ambulatory Visit: Payer: Self-pay | Admitting: General Practice

## 2017-12-18 ENCOUNTER — Ambulatory Visit (INDEPENDENT_AMBULATORY_CARE_PROVIDER_SITE_OTHER)
Admission: RE | Admit: 2017-12-18 | Discharge: 2017-12-18 | Disposition: A | Discharge status: Home / Self Care | Payer: Medicare Other | Source: Ambulatory Visit | Attending: Family | Admitting: Family

## 2017-12-18 ENCOUNTER — Ambulatory Visit (INDEPENDENT_AMBULATORY_CARE_PROVIDER_SITE_OTHER): Payer: Medicare Other | Admitting: General Practice

## 2017-12-18 VITALS — BP 142/90 | HR 80 | Temp 98.0°F | Ht 76.0 in | Wt 212.0 lb

## 2017-12-18 DIAGNOSIS — Z7901 Long term (current) use of anticoagulants: Secondary | ICD-10-CM | POA: Diagnosis not present

## 2017-12-18 DIAGNOSIS — M25562 Pain in left knee: Secondary | ICD-10-CM

## 2017-12-18 DIAGNOSIS — I2699 Other pulmonary embolism without acute cor pulmonale: Secondary | ICD-10-CM

## 2017-12-18 LAB — POCT INR: INR: 2.7 (ref 2.0–3.0)

## 2017-12-18 MED ORDER — WARFARIN SODIUM 5 MG PO TABS: ORAL_TABLET | ORAL | 0 refills | Status: DC

## 2017-12-18 NOTE — Patient Instructions (Signed)
Pre visit review using our clinic review tool, if applicable. No additional management support is needed unless otherwise documented below in the visit note.  Continue to take 1 tablet daily except 2 tablets on Sunday/Tuesday and Thursdays.  Re-check in 6 weeks.  

## 2017-12-18 NOTE — Progress Notes (Signed)
Charles [name] is a 67 y.o. male with the following history as recorded in EpicCare:  Patient Active Problem List   Diagnosis Date Noted  . Insomnia 07/13/2017  . Long term (current) use of anticoagulants 07/11/2017  . ATN (acute tubular necrosis) ([place])   . Melena   . Balance problems 06/13/2017  . Acute hearing loss, right 06/13/2017  . Dehydration 06/13/2017  . Symptomatic anemia 06/13/2017  . COPD not affecting current episode of care ([place]) 06/13/2017  . Hx of pulmonary embolus 06/13/2017  . Thrombocytopenia ([place]) 06/13/2017  . Alcohol abuse 06/13/2017  . Acute renal failure (ARF) ([place]) 06/13/2017  . Ear pain, bilateral 08/24/2016  . Lung blebs ([place]) 07/31/2016  . Acute DVT (deep venous thrombosis) ([place]) 07/25/2016  . Pneumothorax on right 07/23/2016  . PE (pulmonary thromboembolism) ([place]) 07/23/2016  . CKD (chronic kidney disease), stage III ([place]) 07/23/2016  . Gross hematuria 04/25/2016  . UTI (urinary tract infection) 08/26/2015  . Bilateral hearing loss 08/26/2015  . Coronary artery calcification seen on CT scan 06/01/2015  . Low back pain 06/01/2015  . Hyperglycemia 12/18/2014  . Anemia 05/26/2014  . 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 Brain contusion ([place]) 04/23/2014  . Lung nodule 03/09/2014  . Acute pulmonary embolism ([place]) 02/18/2014  . Multiple nodules of lung 02/18/2014  . Cough 12/24/2013  . Allergic rhinitis 12/24/2013  . Other chest pain 12/24/2013  . Increased prostate specific antigen (PSA) velocity 11/20/2013  . Eustachian tube dysfunction 11/20/2013  . Hearing loss on right 11/20/2013  . Serous otitis media 11/15/2013  . Muscle tightness 10/02/2013  . Rotator cuff injury 09/17/2013  . Skin lesion 07/25/2013  . Anal pain 12/18/2012  . Habitual alcohol use 11/17/2012  . Cellulitis and abscess of buttock 11/15/2012  . Cluster headaches 06/07/2012  . BPH (benign prostatic hyperplasia) 11/22/2010  . Abnormal LFTs 11/14/2010  . Right inguinal  hernia 09/16/2010  . Nocturia 08/29/2010  . Preventative health care 08/28/2010  . FREQUENCY, URINARY 12/24/2009  . COPD (chronic obstructive pulmonary disease) ([place]) 05/12/2009  . ABNORMAL ELECTROCARDIOGRAM 11/02/2008  . Solitary pulmonary nodule 05/14/2008  . Cramp of limb 07/19/2007  . DIVERTICULOSIS, COLON 03/07/2007  . Hyperlipidemia 10/23/2006  . 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/23/2006  . Essential hypertension 10/23/2006  . Unspecified Peripheral Vascular Disease 10/23/2006  . HEMORRHOIDS 10/23/2006  . ABSCESS, LUNG 10/23/2006  . GERD 10/23/2006    Current Outpatient Medications  Medication Sig Dispense Refill  . amLODipine (NORVASC) 10 MG tablet Take 1 tablet (10 mg total) by mouth daily. 90 tablet 3  . ezetimibe (ZETIA) 10 MG tablet Take 1 tablet (10 mg total) by mouth daily. 90 tablet 3  . rosuvastatin (CRESTOR) 40 MG tablet TAKE 1 TABLET DAILY 90 tablet 3  . temazepam (RESTORIL) 15 MG capsule 1-2 tab by mouth at bedtime as needed for sleep 60 capsule 1  . warfarin (COUMADIN) 5 MG tablet Take 1 tablet daily except 2 tablets on Sun Tues and Thurs or AS DIRECTED BY ANTICOAGULATION CLINIC 130 tablet 0   No current facility-administered medications for this visit.     Allergies: Patient has no known allergies.  Past Medical History:  Diagnosis Date  . ABNORMAL ELECTROCARDIOGRAM 11/02/2008  . ABSCESS, LUNG 10/23/2006  . Anemia 05/26/2014  . BACTERIAL PNEUMONIA 12/28/2009  . BPH (benign prostatic hyperplasia) 11/22/2010  . 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 CHEST PAIN 12/24/2009  . Coronary artery calcification seen on CT scan 06/01/2015  . Cramp of limb 07/19/2007  . DIVERTICULOSIS,  COLON 03/07/2007  . DVT (deep venous thrombosis) ([place]) 01/2014   LLE  . Emphysema of lung ([place])   . 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/23/2006  . FLANK PAIN, LEFT 02/25/2010  . FREQUENCY, URINARY 12/24/2009  . HEMORRHOIDS 10/23/2006  . HYPERLIPIDEMIA 10/23/2006  . HYPERTENSION 10/23/2006  . PE (pulmonary embolism) 02/2014   . PLANTAR FASCIITIS, LEFT 11/02/2008  . Pneumonia 12/2013 X 2  . PSA, INCREASED 11/20/2007  . PULMONARY NODULE 05/14/2008  . Unspecified Peripheral Vascular Disease 10/23/2006    Past Surgical History:  Procedure Laterality Date  . ESOPHAGOGASTRODUODENOSCOPY (EGD) WITH PROPOFOL N/A 06/15/2017   Procedure: ESOPHAGOGASTRODUODENOSCOPY (EGD) WITH PROPOFOL;  Surgeon: Jerene Bears, MD;  Location: [facility];  Service: Gastroenterology;  Laterality: N/A;  . HEMORRHOID SURGERY  ?1990's  . INGUINAL HERNIA REPAIR Bilateral ?2000's  . IVC FILTER INSERTION N/A 10/03/2016   Procedure: IVC Filter Retrieveal;  Surgeon: Serafina Mitchell, MD;  Location: [facility];  Service: Cardiovascular;  Laterality: N/A;  . SHOULDER ARTHROSCOPY W/ ROTATOR CUFF REPAIR Right 11/2013  . STAPLING OF BLEBS Right 07/31/2016   Procedure: BLEBECTOMY;  Surgeon: Melrose Nakayama, MD;  Location: [facility];  Service: Thoracic;  Laterality: Right;  . VENA CAVA FILTER PLACEMENT Right 08/04/2016   Procedure: INSERTION VENA-CAVA FILTER;  Surgeon: Melrose Nakayama, MD;  Location: [place] [facility];  Service: Thoracic;  Laterality: Right;  Marland Kitchen VIDEO ASSISTED THORACOSCOPY Right 07/31/2016   Procedure: VIDEO ASSISTED THORACOSCOPY;  Surgeon: Melrose Nakayama, MD;  Location: [facility];  Service: Thoracic;  Laterality: Right;  Marland Kitchen VIDEO ASSISTED THORACOSCOPY Right 08/04/2016   Procedure: REDO VIDEO ASSISTED THORACOSCOPY- EVACUATION OF HEMOTHORAX;  Surgeon: Melrose Nakayama, MD;  Location: [facility];  Service: Thoracic;  Laterality: Right;  Marland Kitchen VIDEO BRONCHOSCOPY WITH ENDOBRONCHIAL NAVIGATION N/A 03/11/2014   Procedure: VIDEO BRONCHOSCOPY WITH ENDOBRONCHIAL NAVIGATION;  Surgeon: Collene Gobble, MD;  Location: [facility];  Service: Thoracic;  Laterality: N/A;    Family History  Problem Relation Age of Onset  . Heart disease Mother   . Heart disease Father   . Kidney disease Sister        Renal transplant  . Colon cancer Neg Hx     Social History    Tobacco Use  . Smoking status: Former Smoker    Packs/day: 1.00    Years: 15.00    Pack years: 15.00    Types: Cigarettes  . Smokeless tobacco: Never Used  . Tobacco comment: "quit smoking cigarettes in the 1990's"  Substance Use Topics  . Alcohol use: Yes    Alcohol/week: 0.0 standard drinks    Comment: patient states he stopped 4-5 days ago    Subjective:  Left knee "swelling" x 1 month; seemed to start after helping a friend move prior to onset of symptoms; denies any numbness or tingling; no prior history of arthritis; has not used any OTC treatments; no history of gout; cannot use oral NSAIDs due to using Coumadin;      Objective:  Vitals:   12/18/17 0839  BP: (!) 142/90  Pulse: 80  Temp: 98 F (36.7 C)  TempSrc: Oral  SpO2: 96%  Weight: 212 lb (96.2 kg)  Height: 6\' 4"  (1.93 m)    General: Well developed, well nourished, in no acute distress  Skin : Warm and dry.  Head: Normocephalic and atraumatic  Eyes: Sclera and conjunctiva clear; pupils round and reactive to light; extraocular movements intact  Ears: External normal; canals clear; tympanic membranes normal  Oropharynx: Pink,  supple. No suspicious lesions  Neck: Supple without thyromegaly, adenopathy  Lungs: Respirations unlabored; Musculoskeletal: No deformities; no active joint inflammation  Extremities: No edema, cyanosis, clubbing  Vessels: Symmetric bilaterally  Neurologic: Alert and oriented; speech intact; face symmetrical; moves all extremities well; CNII-XII intact without focal deficit   Assessment:  1. Acute pain of left knee     Plan:  Suspect soft-tissue swelling; trial of [place] topical- bid to affected area; update X-ray today; follow-up if no better- may need to see sports medicine.   He will plan to get his INR checked today as well.    No follow-ups on file.  Orders Placed This Encounter  Procedures  . DG Knee Complete 4 Views Left    Standing Status:   Future    Number of  Occurrences:   1    Standing Expiration Date:   02/18/2019    Order Specific Question:   Reason for Exam (SYMPTOM  OR DIAGNOSIS REQUIRED)    Answer:   left knee pain swelling x 1 month    Order Specific Question:   Preferred imaging location?    Answer:   [facility]    Order Specific Question:   Radiology Contrast Protocol - do NOT remove file path    Answer:   [email or web address]    Requested Prescriptions    No prescriptions requested or ordered in this encounter

## 2017-12-21 ENCOUNTER — Ambulatory Visit: Payer: Medicare Other

## 2018-01-15 ENCOUNTER — Ambulatory Visit: Payer: 59 | Admitting: Internal Medicine

## 2018-01-16 ENCOUNTER — Other Ambulatory Visit: Payer: Self-pay | Admitting: Internal Medicine

## 2018-01-29 ENCOUNTER — Ambulatory Visit (INDEPENDENT_AMBULATORY_CARE_PROVIDER_SITE_OTHER): Payer: Medicare Other | Admitting: General Practice

## 2018-01-29 DIAGNOSIS — Z7901 Long term (current) use of anticoagulants: Secondary | ICD-10-CM

## 2018-01-29 DIAGNOSIS — I2699 Other pulmonary embolism without acute cor pulmonale: Secondary | ICD-10-CM

## 2018-01-29 LAB — POCT INR: INR: 2.9 (ref 2.0–3.0)

## 2018-01-29 NOTE — Patient Instructions (Addendum)
Pre visit review using our clinic review tool, if applicable. No additional management support is needed unless otherwise documented below in the visit note.  Continue to take 1 tablet daily except 2 tablets on Sunday/Tuesday and Thursdays.  Re-check in 6 weeks.

## 2018-02-01 ENCOUNTER — Ambulatory Visit (INDEPENDENT_AMBULATORY_CARE_PROVIDER_SITE_OTHER): Payer: Medicare Other | Admitting: Family Medicine

## 2018-02-01 ENCOUNTER — Telehealth: Payer: Self-pay | Admitting: Internal Medicine

## 2018-02-01 ENCOUNTER — Encounter: Payer: Self-pay | Admitting: Family Medicine

## 2018-02-01 ENCOUNTER — Ambulatory Visit: Payer: Self-pay

## 2018-02-01 VITALS — BP 126/72 | HR 80 | Resp 16 | Wt 207.0 lb

## 2018-02-01 DIAGNOSIS — M1712 Unilateral primary osteoarthritis, left knee: Secondary | ICD-10-CM

## 2018-02-01 MED ORDER — DICLOFENAC SODIUM 2 % TD SOLN: 1.0000 "application " | Freq: Two times a day (BID) | TRANSDERMAL | 3 refills | Status: DC

## 2018-02-01 MED ORDER — ROSUVASTATIN CALCIUM 40 MG PO TABS: 40.0000 mg | ORAL_TABLET | Freq: Every day | ORAL | 5 refills | Status: DC

## 2018-02-01 MED ORDER — TEMAZEPAM 15 MG PO CAPS: ORAL_CAPSULE | ORAL | 1 refills | Status: DC

## 2018-02-01 MED ORDER — EZETIMIBE 10 MG PO TABS: 10.0000 mg | ORAL_TABLET | Freq: Every day | ORAL | 3 refills | Status: DC

## 2018-02-01 MED ORDER — AMLODIPINE BESYLATE 10 MG PO TABS: 10.0000 mg | ORAL_TABLET | Freq: Every day | ORAL | 5 refills | Status: DC

## 2018-02-01 NOTE — Progress Notes (Signed)
Charles [name] - 67 y.o. male MRN [id number]  Date of birth: 06-Mar-1950  SUBJECTIVE:  Including CC & ROS.  No chief complaint on file.   Charles [name] is a 67 y.o. male that is presenting with left knee pain.  Pain is currently mild.  It is localized to the joint line and anterior aspect of the knee.  Denies any mechanical symptoms.  The pain after he is up and somebody move.  He was going about stairs repeatedly during the day.  No history of surgery on the knee.  Has not been taking any medications.  No history of similar symptoms.  He rides a bike for exercise.  The pain is worse with going up or down stairs..  Independent review of the left knee x-ray from 10/29 shows minimal medial joint space narrowing.    Review of Systems  Constitutional: Negative for fever.  HENT: Negative for congestion.   Respiratory: Negative for cough.   Cardiovascular: Negative for chest pain.  Gastrointestinal: Negative for abdominal pain.  Musculoskeletal: Positive for arthralgias.  Skin: Negative for color change.  Neurological: Negative for weakness.  Hematological: Negative for adenopathy.  Psychiatric/Behavioral: Negative for agitation.    HISTORY: Past Medical, Surgical, Social, and Family History Reviewed & Updated per EMR.   Pertinent Historical Findings include:  Past Medical History:  Diagnosis Date  . ABNORMAL ELECTROCARDIOGRAM 11/02/2008  . ABSCESS, LUNG 10/23/2006  . Anemia 05/26/2014  . BACTERIAL PNEUMONIA 12/28/2009  . BPH (benign prostatic hyperplasia) 11/22/2010  . Cerebellar stroke ([place]) 05/26/2014  . CHEST PAIN 12/24/2009  . Coronary artery calcification seen on CT scan 06/01/2015  . Cramp of limb 07/19/2007  . DIVERTICULOSIS, COLON 03/07/2007  . DVT (deep venous thrombosis) ([place]) 01/2014   LLE  . Emphysema of lung ([place])   . 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/23/2006  . FLANK PAIN, LEFT 02/25/2010  . FREQUENCY, URINARY 12/24/2009  . HEMORRHOIDS 10/23/2006  . HYPERLIPIDEMIA 10/23/2006  . HYPERTENSION  10/23/2006  . PE (pulmonary embolism) 02/2014  . PLANTAR FASCIITIS, LEFT 11/02/2008  . Pneumonia 12/2013 X 2  . PSA, INCREASED 11/20/2007  . PULMONARY NODULE 05/14/2008  . Unspecified Peripheral Vascular Disease 10/23/2006    Past Surgical History:  Procedure Laterality Date  . ESOPHAGOGASTRODUODENOSCOPY (EGD) WITH PROPOFOL N/A 06/15/2017   Procedure: ESOPHAGOGASTRODUODENOSCOPY (EGD) WITH PROPOFOL;  Surgeon: Jerene Bears, MD;  Location: [facility];  Service: Gastroenterology;  Laterality: N/A;  . HEMORRHOID SURGERY  ?1990's  . INGUINAL HERNIA REPAIR Bilateral ?2000's  . IVC FILTER INSERTION N/A 10/03/2016   Procedure: IVC Filter Retrieveal;  Surgeon: Serafina Mitchell, MD;  Location: [facility];  Service: Cardiovascular;  Laterality: N/A;  . SHOULDER ARTHROSCOPY W/ ROTATOR CUFF REPAIR Right 11/2013  . STAPLING OF BLEBS Right 07/31/2016   Procedure: BLEBECTOMY;  Surgeon: Melrose Nakayama, MD;  Location: [facility];  Service: Thoracic;  Laterality: Right;  . VENA CAVA FILTER PLACEMENT Right 08/04/2016   Procedure: INSERTION VENA-CAVA FILTER;  Surgeon: Melrose Nakayama, MD;  Location: [facility];  Service: Thoracic;  Laterality: Right;  Marland Kitchen VIDEO ASSISTED THORACOSCOPY Right 07/31/2016   Procedure: VIDEO ASSISTED THORACOSCOPY;  Surgeon: Melrose Nakayama, MD;  Location: [facility];  Service: Thoracic;  Laterality: Right;  Marland Kitchen VIDEO ASSISTED THORACOSCOPY Right 08/04/2016   Procedure: REDO VIDEO ASSISTED THORACOSCOPY- EVACUATION OF HEMOTHORAX;  Surgeon: Melrose Nakayama, MD;  Location: [facility];  Service: Thoracic;  Laterality: Right;  Marland Kitchen VIDEO BRONCHOSCOPY WITH ENDOBRONCHIAL NAVIGATION N/A 03/11/2014   Procedure: VIDEO  BRONCHOSCOPY WITH ENDOBRONCHIAL NAVIGATION;  Surgeon: Collene Gobble, MD;  Location: [facility];  Service: Thoracic;  Laterality: N/A;    No Known Allergies  Family History  Problem Relation Age of Onset  . Heart disease Mother   . Heart disease Father   . Kidney disease Sister         Renal transplant  . Colon cancer Neg Hx      Social History   Socioeconomic History  . Marital status: Married    Spouse name: Not on file  . Number of children: 2  . Years of education: Not on file  . Highest education level: Not on file  Occupational History  . Occupation: retired  Scientific laboratory technician  . Financial resource strain: Not on file  . Food insecurity:    Worry: Not on file    Inability: Not on file  . Transportation needs:    Medical: Not on file    Non-medical: Not on file  Tobacco Use  . Smoking status: Former Smoker    Packs/day: 1.00    Years: 15.00    Pack years: 15.00    Types: Cigarettes  . Smokeless tobacco: Never Used  . Tobacco comment: "quit smoking cigarettes in the 1990's"  Substance and Sexual Activity  . Alcohol use: Yes    Alcohol/week: 0.0 standard drinks    Comment: patient states he stopped 4-5 days ago  . 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 Sexual activity: Yes  Lifestyle  . Physical activity:    Days per week: Not on file    Minutes per session: Not on file  . Stress: Not on file  Relationships  . Social connections:    Talks on phone: Not on file    Gets together: Not on file    Attends religious service: Not on file    Active member of club or organization: Not on file    Attends meetings of clubs or organizations: Not on file    Relationship status: Not on file  . Intimate partner violence:    Fear of current or ex partner: Not on file    Emotionally abused: Not on file    Physically abused: Not on file    Forced sexual activity: Not on file  Other Topics Concern  . Not on file  Social History Narrative  . Not on file     PHYSICAL EXAM:  VS: BP 126/72   Pulse 80   Resp 16   Wt 207 lb (93.9 kg)   SpO2 98%   BMI 25.20 kg/m  Physical Exam Gen: NAD, alert, cooperative with exam, well-appearing ENT: normal lips, normal nasal mucosa,  Eye: normal EOM, normal conjunctiva and lids CV:  no edema,  +2 pedal pulses   Resp: no accessory muscle use, non-labored,  Skin: no rashes, no areas of induration  Neuro: normal tone, normal sensation to touch Psych:  normal insight, alert and oriented MSK:  Left Knee: Normal to inspection with no erythema or effusion or obvious bony abnormalities. Palpation normal with no warmth, or condyle tenderness. No significant pain with palpation to medial or lateral joint line  ROM full in flexion and extension and lower leg rotation. Ligaments with solid consistent endpoints including  LCL, MCL. Negative Mcmurray's tests. Non painful patellar compression. Patellar glide without crepitus. Patellar and quadriceps tendons unremarkable. Hamstring and quadriceps strength is normal.  Neurovascularly intact   Limited ultrasound: left knee:  Trace effusion. Mild narrowing of the medial joint line.  Appears to be a horizontal tear within the anterior horn of the medial meniscus. Normal-appearing lateral joint line.  Summary: Findings are consistent with mild degenerative changes and degenerative meniscal tear.  Ultrasound and interpretation by Clearance Coots, MD     ASSESSMENT & PLAN:   Primary osteoarthritis of left knee Pain currently mild severity.  Likely has component of degenerative meniscal tear degenerative changes of the knee. -Counseled on home exercise therapy and supportive care. -Pennsaid. -If no improvement consider physical therapy or injection.

## 2018-02-01 NOTE — Telephone Encounter (Signed)
Patient was in the office today to see Dr Raeford Razor and said that due to his insurance, he is no longer able to use the mail order pharmacy for his prescriptions. Patient is now using [facility] on [address].  He states that he will be needing refills on his medications soon.

## 2018-02-01 NOTE — Patient Instructions (Signed)
Nice to meet you  Please try the exercises  Please try ice on the knee  Please try the medicine to rub on the knees Please see me back in 4-6 weeks if no better.

## 2018-02-01 NOTE — Telephone Encounter (Signed)
Pharmacy updated and refills have been sent to new pharmacy.

## 2018-02-01 NOTE — Assessment & Plan Note (Signed)
Pain currently mild severity.  Likely has component of degenerative meniscal tear degenerative changes of the knee. -Counseled on home exercise therapy and supportive care. -Pennsaid. -If no improvement consider physical therapy or injection.

## 2018-02-05 ENCOUNTER — Telehealth: Payer: Self-pay | Admitting: Internal Medicine

## 2018-02-05 ENCOUNTER — Telehealth: Payer: Self-pay | Admitting: Family Medicine

## 2018-02-05 NOTE — Telephone Encounter (Signed)
error 

## 2018-02-05 NOTE — Telephone Encounter (Signed)
Copied from [place] [phone]. Topic: Quick Communication - Rx Refill/Question >> Feb 05, 2018 12:20 PM Valla Leaver wrote: Copied from [place] [id number]. Topic: Quick Communication - Rx Refill/Question >> Feb 05, 2018 12:17 PM Waylan Rocher, Lumin L wrote: Medication: Diclofenac Sodium (PENNSAID) 2 % SOLN (needs PA)  Has the patient contacted their pharmacy? Yes.   (Agent: If no, request that the patient contact the pharmacy for the refill.) (Agent: If yes, when and what did the pharmacy advise?)  Preferred Pharmacy (with phone number or street name): [place], [place] [address] Phone: [phone] Fax: [phone]  Agent: Please be advised that RX refills may take up to 3 business days. We ask that you follow-up with your pharmacy.

## 2018-02-06 NOTE — Telephone Encounter (Signed)
Received letter from [facility] that medication was approved.   Rosemarie Ax, MD [facility] 02/06/2018, 11:25 AM

## 2018-02-15 NOTE — Telephone Encounter (Signed)
Pt calling stating that he spoke with [facility] an hour before he called and they states that they still ned prior authorization he states that the office need to call [facility] because he keep getting different messages that the medicine has been approved but the pharmacy says no it hasn't

## 2018-02-21 MED ORDER — PREDNISONE 5 MG PO TABS: ORAL_TABLET | ORAL | 0 refills | Status: DC

## 2018-02-21 NOTE — Telephone Encounter (Signed)
Will send in prednisone. Patient is on coumadin. If no improvement then would try injection next.   Rosemarie Ax, MD [facility] 02/21/2018, 2:19 PM

## 2018-02-21 NOTE — Telephone Encounter (Signed)
Pt calling stating that he need his medicine to be authorize he has been waiting for 3 weeks on this he would like something else to be given to him please call pt today at [phone]

## 2018-02-21 NOTE — Addendum Note (Signed)
Addended by: Rosemarie Ax on: 02/21/2018 02:19 PM   Modules accepted: Orders

## 2018-03-12 ENCOUNTER — Ambulatory Visit (INDEPENDENT_AMBULATORY_CARE_PROVIDER_SITE_OTHER): Payer: Medicare Other | Admitting: General Practice

## 2018-03-12 DIAGNOSIS — Z7901 Long term (current) use of anticoagulants: Secondary | ICD-10-CM

## 2018-03-12 DIAGNOSIS — I2699 Other pulmonary embolism without acute cor pulmonale: Secondary | ICD-10-CM

## 2018-03-12 LAB — POCT INR: INR: 3.2 — AB (ref 2.0–3.0)

## 2018-03-12 NOTE — Patient Instructions (Addendum)
Pre visit review using our clinic review tool, if applicable. No additional management support is needed unless otherwise documented below in the visit note.  Hold dosage today (1/21) and then continue to take 1 tablet daily except 2 tablets on Sunday/Tuesday and Thursdays.  Re-check in 4 weeks.

## 2018-03-18 ENCOUNTER — Other Ambulatory Visit: Payer: Self-pay | Admitting: Internal Medicine

## 2018-03-18 NOTE — Telephone Encounter (Signed)
Done erx 

## 2018-03-27 ENCOUNTER — Other Ambulatory Visit: Payer: Self-pay | Admitting: Internal Medicine

## 2018-04-09 ENCOUNTER — Ambulatory Visit (INDEPENDENT_AMBULATORY_CARE_PROVIDER_SITE_OTHER): Payer: Medicare Other | Admitting: General Practice

## 2018-04-09 DIAGNOSIS — Z7901 Long term (current) use of anticoagulants: Secondary | ICD-10-CM | POA: Diagnosis not present

## 2018-04-09 DIAGNOSIS — I2699 Other pulmonary embolism without acute cor pulmonale: Secondary | ICD-10-CM

## 2018-04-09 LAB — POCT INR: INR: 3.4 — AB (ref 2.0–3.0)

## 2018-04-09 NOTE — Patient Instructions (Addendum)
Pre visit review using our clinic review tool, if applicable. No additional management support is needed unless otherwise documented below in the visit note.  Hold dosage tomorrow (2/19) and then decrease dosage and take 1 tablet daily except 2 tablets on Sunday and Thursdays.  Re-check in 4 weeks.

## 2018-05-07 ENCOUNTER — Ambulatory Visit (INDEPENDENT_AMBULATORY_CARE_PROVIDER_SITE_OTHER): Payer: Medicare Other | Admitting: General Practice

## 2018-05-07 ENCOUNTER — Other Ambulatory Visit: Payer: Self-pay

## 2018-05-07 DIAGNOSIS — Z7901 Long term (current) use of anticoagulants: Secondary | ICD-10-CM

## 2018-05-07 DIAGNOSIS — I2699 Other pulmonary embolism without acute cor pulmonale: Secondary | ICD-10-CM

## 2018-05-07 LAB — POCT INR: INR: 5.3 — AB (ref 2.0–3.0)

## 2018-05-07 NOTE — Patient Instructions (Addendum)
Pre visit review using our clinic review tool, if applicable. No additional management support is needed unless otherwise documented below in the visit note.  Hold dosage today, tomorrow and Thursday.  On Friday start taking 1 tablet daily except 2 tablets on Sundays only.  Re-check in 2 weeks.

## 2018-05-20 ENCOUNTER — Telehealth: Payer: Self-pay

## 2018-05-20 NOTE — Telephone Encounter (Signed)
Left voicemail asking patient to call back if he is not feeling well, we will need to prescreen patient before he comes to office tomorrow for CC visit

## 2018-05-21 ENCOUNTER — Ambulatory Visit (INDEPENDENT_AMBULATORY_CARE_PROVIDER_SITE_OTHER): Payer: Medicare Other | Admitting: General Practice

## 2018-05-21 ENCOUNTER — Other Ambulatory Visit: Payer: Self-pay

## 2018-05-21 DIAGNOSIS — Z7901 Long term (current) use of anticoagulants: Secondary | ICD-10-CM

## 2018-05-21 DIAGNOSIS — I2699 Other pulmonary embolism without acute cor pulmonale: Secondary | ICD-10-CM

## 2018-05-21 LAB — POCT INR: INR: 3 (ref 2.0–3.0)

## 2018-05-21 NOTE — Patient Instructions (Addendum)
Pre visit review using our clinic review tool, if applicable. No additional management support is needed unless otherwise documented below in the visit note.  Hold dosage today and then continue taking 1 tablet daily except 2 tablets on Sundays only.  Re-check in 4 weeks.

## 2018-06-18 ENCOUNTER — Other Ambulatory Visit: Payer: Self-pay

## 2018-06-18 ENCOUNTER — Ambulatory Visit (INDEPENDENT_AMBULATORY_CARE_PROVIDER_SITE_OTHER): Payer: Medicare Other | Admitting: General Practice

## 2018-06-18 DIAGNOSIS — I2699 Other pulmonary embolism without acute cor pulmonale: Secondary | ICD-10-CM

## 2018-06-18 DIAGNOSIS — Z7901 Long term (current) use of anticoagulants: Secondary | ICD-10-CM

## 2018-06-18 LAB — POCT INR: INR: 5.2 — AB (ref 2.0–3.0)

## 2018-06-18 NOTE — Patient Instructions (Addendum)
Pre visit review using our clinic review tool, if applicable. No additional management support is needed unless otherwise documented below in the visit note.  Hold coumadin today, tomorrow and Thursday and then start taking 1 tablet daily.  Re-check in 2 weeks. Encouraged patient to stop or decrease alcohol due to interaction with coumadin.

## 2018-06-26 HISTORY — PX: MYRINGOTOMY WITH TUBE PLACEMENT: SHX5663

## 2018-07-01 IMAGING — CT CT CHEST W/O CM
2 of 3 series · 15 of 36 positions shown, 18 images · non-contrast
Comparison: 08/01/2010, 02/18/2014, 04/01/2014, 10/27/2014,
04/28/2015

CLINICAL DATA: 65-year-old male with a history of follow-up lung
nodules

EXAM:
CT CHEST WITHOUT CONTRAST
TECHNIQUE: Multidetector CT imaging of the chest was performed following the
standard protocol without IV contrast.

[Series 2: thorax · axial · 0.70mm/px · z∈[-312,-26]mm · 12 of 169 slices shown, 15 images]
[im 13/169  mediastinal]
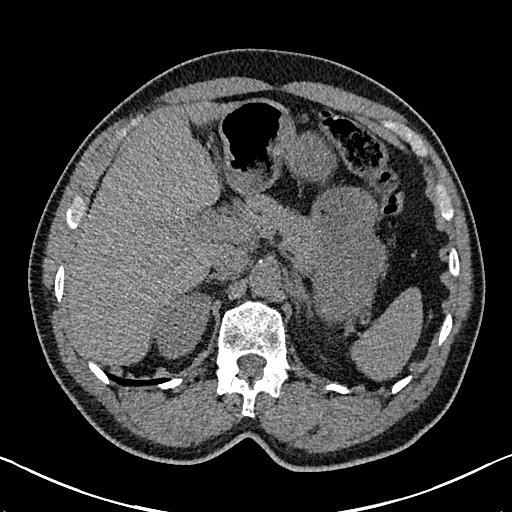
[im 13/169  lung]
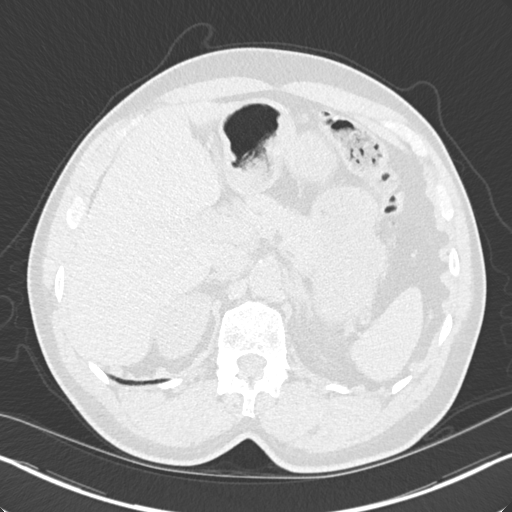
[im 25/169  lung]
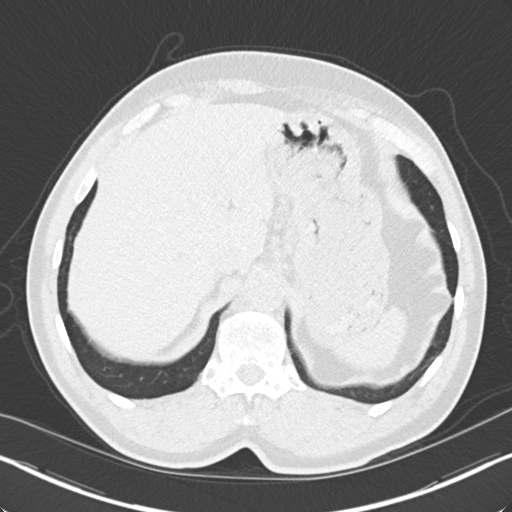
[im 38/169  lung]
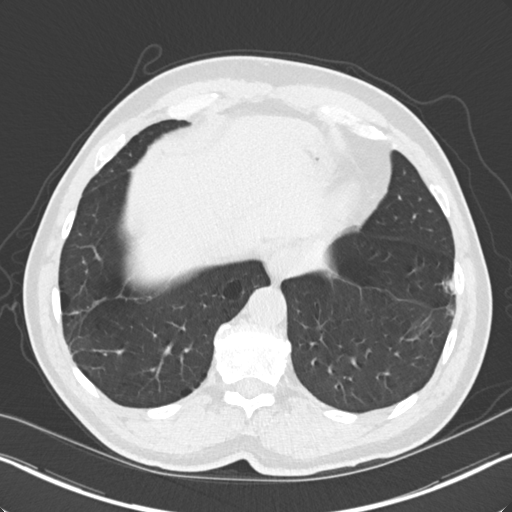
[im 50/169  lung]
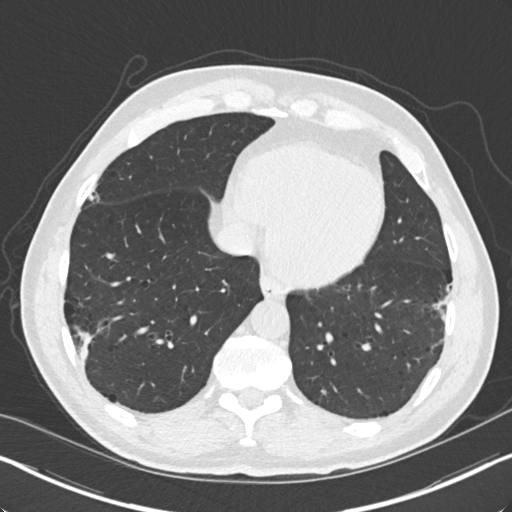
[im 63/169  mediastinal]
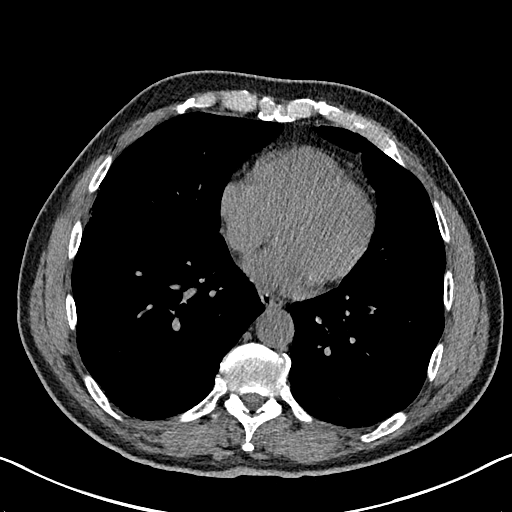
[im 63/169  lung]
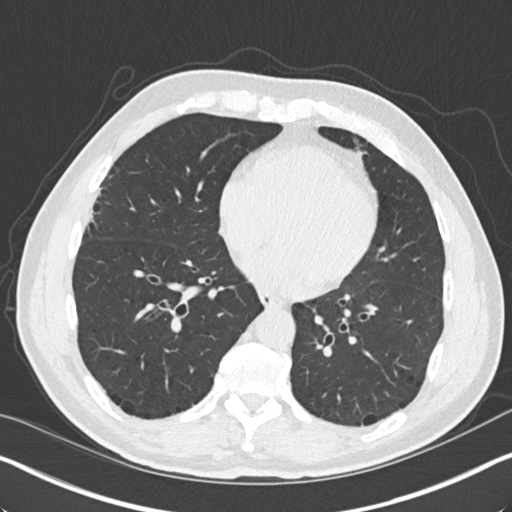
[im 75/169  lung]
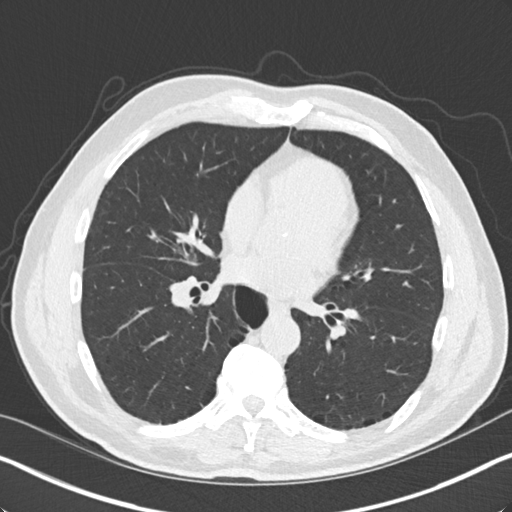
[im 94/169  lung]
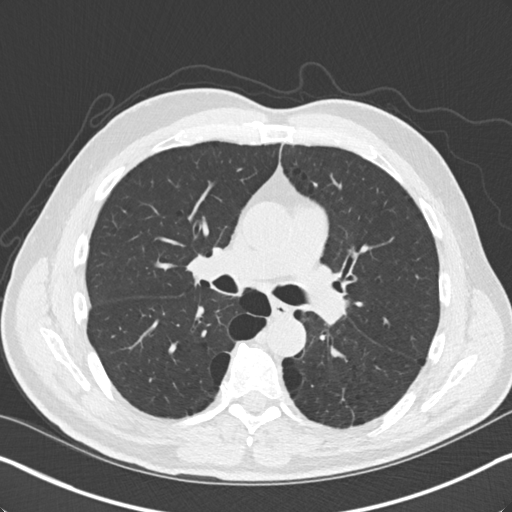
[im 106/169  lung]
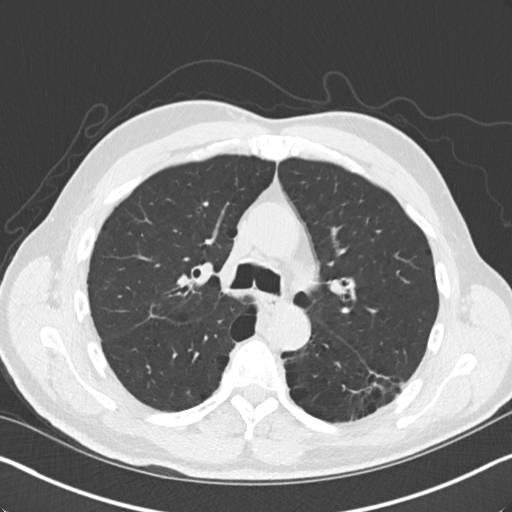
[im 119/169  mediastinal]
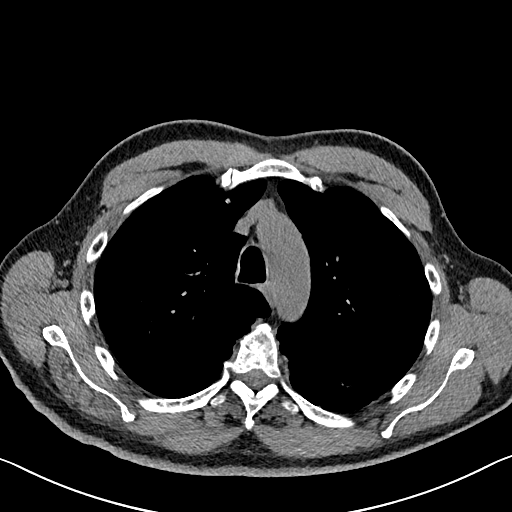
[im 119/169  lung]
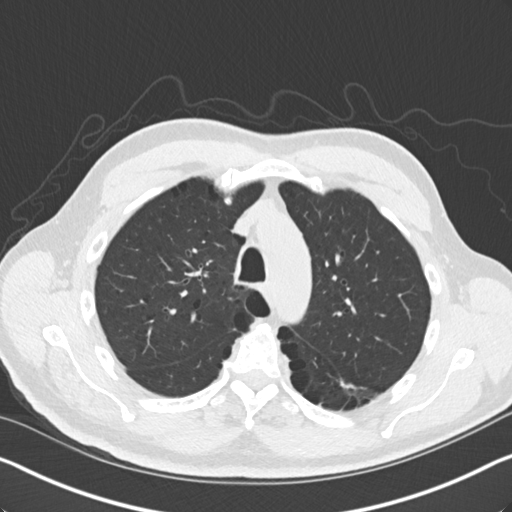
[im 131/169  lung]
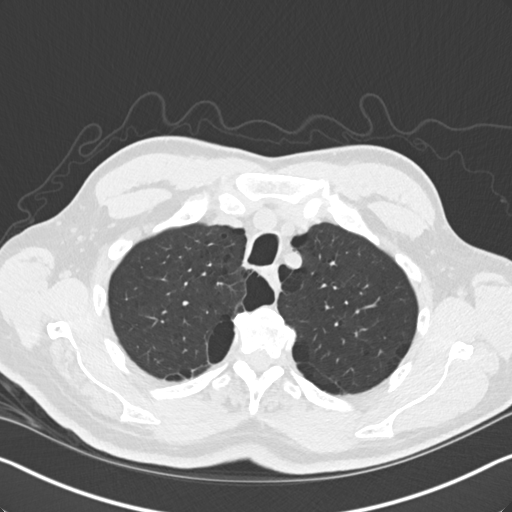
[im 144/169  lung]
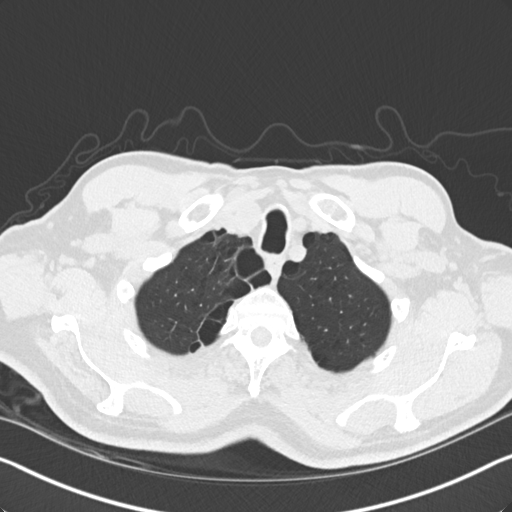
[im 156/169  lung]
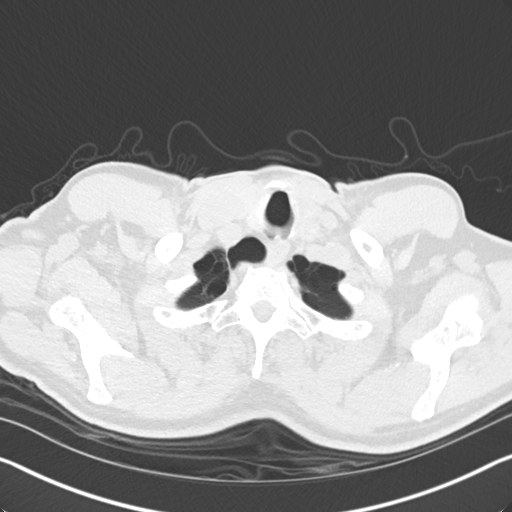

[Series 5: coronal · coronal · 0.66mm/px · 3 of 138 slices shown]
[im 28/138  lung]
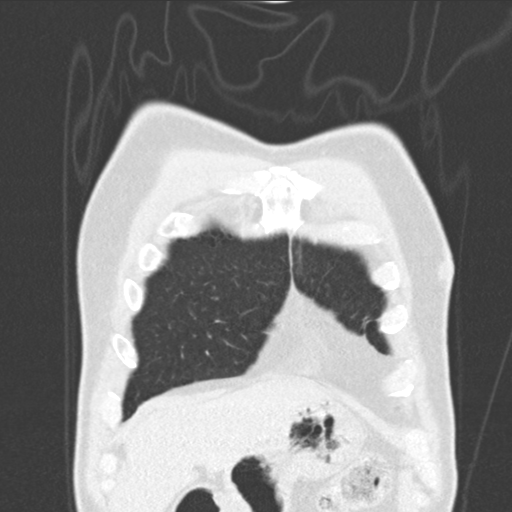
[im 55/138  lung]
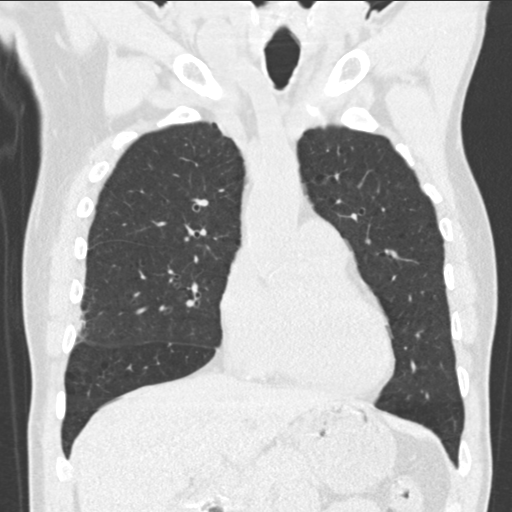
[im 83/138  lung]
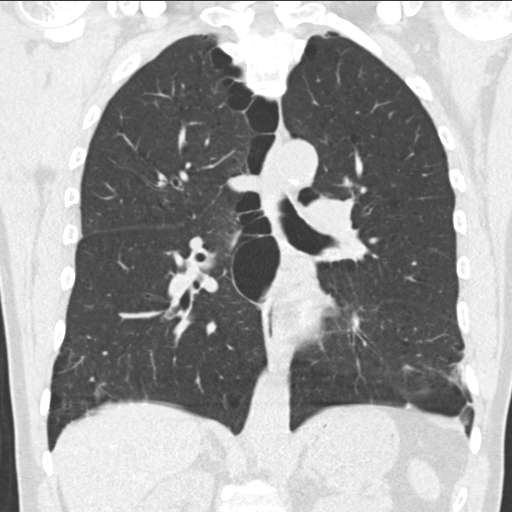

[15 of 36 positions shown; findings below may reference images not displayed]

FINDINGS: Chest:

Chest Wall: Superficial soft tissues unremarkable. No axillary or
supraclavicular adenopathy.

Thoracic Inlet: Partially visualized ballooning of the hypopharynx.
Unremarkable thoracic inlet.

Airways:  No endotracheal debris. No bronchial wall thickening.

Mediastinum: Lymph nodes present, not enlarged. Unremarkable
appearance of the thoracic esophagus. No hiatal hernia.

Heart: Heart not enlarged. No pericardial fluid/ thickening.
Calcifications of aortic valve, with calcifications of the left
main, left anterior descending, circumflex, right coronary arteries.

Vasculature: Vasculature not well evaluated given the absence of
intravenous contrast. No periaortic fluid. No aneurysm.
Calcifications of the aortic arch.

Lungs:

No new confluent airspace disease. No pleural effusion or
pneumothorax.

Paraseptal and centrilobular emphysema again noted.

Right upper lobe: Unchanged nodule in the right upper lobe posterior
segment (image 61). Measurement essentially unchanged, approximately
10 mm x 9 mm.

Right middle lobe: Similar appearance of subpleural reticulation/
thickening of the right middle lobe, lateral segment.

Right lower lobe: Similar appearance of mixed linear and nodular
architectural distortion at the periphery of the right lower lobe.
Cyst anterior there is mild tree-in-bud/ centrilobular nodularity,
essentially unchanged from the comparison.

Left upper lobe: Minimal centrilobular nodularity at the anterior
segment, unchanged from prior with paraseptal emphysema changes.

Left lower lobe: Similar appearance of nodule in the superior
segment with cavitations/nodularity (image 65) measuring essentially
unchanged approximately 11 mm x 20 mm. Again, there has been
resolution of the previous mucoid impaction within the adjacent
airways. Similar appearance of subpleural reticulation and
thickening in the lateral basal segment.

Upper abdomen:

Unremarkable appearance of the visualized upper abdomen.

Musculoskeletal:  No displaced fracture.

Mild degenerative changes of the visualized spine.
IMPRESSION: Relatively similar appearance of regions of nodularity of the
bilateral lungs, with no interval growth and with each region
measuring either unchanged or slightly smaller than comparison CT April 28, 2015. Again, the appearance is favored to reflect
inflammatory/ infectious changes.

Centrilobular and paraseptal emphysematous changes, with small area
of centrilobular nodularity in the anterior segment of the left
upper lobe and the lateral aspect of the right lower lobe,
compatible with emphysema/COPD.

Aortic atherosclerosis, with associated coronary artery disease and
trace aortic valve calcifications.

## 2018-07-02 ENCOUNTER — Telehealth: Payer: Self-pay | Admitting: Internal Medicine

## 2018-07-02 ENCOUNTER — Ambulatory Visit (INDEPENDENT_AMBULATORY_CARE_PROVIDER_SITE_OTHER): Payer: Medicare Other | Admitting: General Practice

## 2018-07-02 DIAGNOSIS — Z7901 Long term (current) use of anticoagulants: Secondary | ICD-10-CM

## 2018-07-02 DIAGNOSIS — I2699 Other pulmonary embolism without acute cor pulmonale: Secondary | ICD-10-CM

## 2018-07-02 LAB — POCT INR: INR: 4.5 — AB (ref 2.0–3.0)

## 2018-07-02 NOTE — Telephone Encounter (Signed)
Patient is scheduled for his yearly follow up on 07/16/2018. He would like to have his labs done prior to the visit. Can orders be put in for him?

## 2018-07-02 NOTE — Telephone Encounter (Signed)
Pt has medicare. Labs must be done same day in order for pt to not be billed for blood work.

## 2018-07-02 NOTE — Telephone Encounter (Signed)
Left message informing patient.

## 2018-07-02 NOTE — Patient Instructions (Addendum)
Pre visit review using our clinic review tool, if applicable. No additional management support is needed unless otherwise documented below in the visit note.  Hold coumadin today, tomorrow and then take 1 tablet daily.  Re-check in 2 weeks. Encouraged patient to stop or decrease alcohol due to interaction with coumadin.

## 2018-07-05 ENCOUNTER — Encounter: Payer: Self-pay | Admitting: Gastroenterology

## 2018-07-16 ENCOUNTER — Other Ambulatory Visit (INDEPENDENT_AMBULATORY_CARE_PROVIDER_SITE_OTHER): Payer: Medicare Other

## 2018-07-16 ENCOUNTER — Other Ambulatory Visit: Payer: Self-pay

## 2018-07-16 ENCOUNTER — Ambulatory Visit (INDEPENDENT_AMBULATORY_CARE_PROVIDER_SITE_OTHER): Payer: Medicare Other | Admitting: Internal Medicine

## 2018-07-16 ENCOUNTER — Ambulatory Visit (INDEPENDENT_AMBULATORY_CARE_PROVIDER_SITE_OTHER): Payer: Medicare Other | Admitting: General Practice

## 2018-07-16 ENCOUNTER — Encounter: Payer: Self-pay | Admitting: Internal Medicine

## 2018-07-16 VITALS — BP 136/82 | HR 81 | Temp 98.1°F | Ht 76.0 in | Wt 180.0 lb

## 2018-07-16 DIAGNOSIS — N183 Chronic kidney disease, stage 3 unspecified: Secondary | ICD-10-CM

## 2018-07-16 DIAGNOSIS — E785 Hyperlipidemia, unspecified: Secondary | ICD-10-CM | POA: Diagnosis not present

## 2018-07-16 DIAGNOSIS — D631 Anemia in chronic kidney disease: Secondary | ICD-10-CM | POA: Diagnosis not present

## 2018-07-16 DIAGNOSIS — Z7901 Long term (current) use of anticoagulants: Secondary | ICD-10-CM

## 2018-07-16 DIAGNOSIS — N189 Chronic kidney disease, unspecified: Secondary | ICD-10-CM

## 2018-07-16 DIAGNOSIS — E559 Vitamin D deficiency, unspecified: Secondary | ICD-10-CM | POA: Diagnosis not present

## 2018-07-16 DIAGNOSIS — R972 Elevated prostate specific antigen [PSA]: Secondary | ICD-10-CM

## 2018-07-16 DIAGNOSIS — I10 Essential (primary) hypertension: Secondary | ICD-10-CM

## 2018-07-16 DIAGNOSIS — I2699 Other pulmonary embolism without acute cor pulmonale: Secondary | ICD-10-CM

## 2018-07-16 DIAGNOSIS — R739 Hyperglycemia, unspecified: Secondary | ICD-10-CM

## 2018-07-16 DIAGNOSIS — E538 Deficiency of other specified B group vitamins: Secondary | ICD-10-CM | POA: Diagnosis not present

## 2018-07-16 LAB — CBC WITH DIFFERENTIAL/PLATELET
Basophils Absolute: 0.1 K/uL (ref 0.0–0.1)
Basophils Relative: 0.9 % (ref 0.0–3.0)
Eosinophils Absolute: 0.1 K/uL (ref 0.0–0.7)
Eosinophils Relative: 1.5 % (ref 0.0–5.0)
HCT: 41.2 % (ref 39.0–52.0)
Hemoglobin: 14.2 g/dL (ref 13.0–17.0)
Lymphocytes Relative: 26 % (ref 12.0–46.0)
Lymphs Abs: 2.1 K/uL (ref 0.7–4.0)
MCHC: 34.5 g/dL (ref 30.0–36.0)
MCV: 96.6 fl (ref 78.0–100.0)
Monocytes Absolute: 1.2 K/uL — ABNORMAL HIGH (ref 0.1–1.0)
Monocytes Relative: 15 % — ABNORMAL HIGH (ref 3.0–12.0)
Neutro Abs: 4.7 K/uL (ref 1.4–7.7)
Neutrophils Relative %: 56.6 % (ref 43.0–77.0)
Platelets: 164 K/uL (ref 150.0–400.0)
RBC: 4.27 Mil/uL (ref 4.22–5.81)
RDW: 13.7 % (ref 11.5–15.5)
WBC: 8.2 K/uL (ref 4.0–10.5)

## 2018-07-16 LAB — IBC PANEL
Iron: 143 ug/dL (ref 42–165)
Saturation Ratios: 56.4 % — ABNORMAL HIGH (ref 20.0–50.0)
Transferrin: 181 mg/dL — ABNORMAL LOW (ref 212.0–360.0)

## 2018-07-16 LAB — LIPID PANEL
Cholesterol: 208 mg/dL — ABNORMAL HIGH (ref 0–200)
HDL: 67.8 mg/dL
NonHDL: 140.58
Total CHOL/HDL Ratio: 3
Triglycerides: 321 mg/dL — ABNORMAL HIGH (ref 0.0–149.0)
VLDL: 64.2 mg/dL — ABNORMAL HIGH (ref 0.0–40.0)

## 2018-07-16 LAB — VITAMIN D 25 HYDROXY (VIT D DEFICIENCY, FRACTURES): VITD: 15.82 ng/mL — ABNORMAL LOW (ref 30.00–100.00)

## 2018-07-16 LAB — BASIC METABOLIC PANEL WITH GFR
BUN: 28 mg/dL — ABNORMAL HIGH (ref 6–23)
CO2: 24 meq/L (ref 19–32)
Calcium: 8.7 mg/dL (ref 8.4–10.5)
Chloride: 106 meq/L (ref 96–112)
Creatinine, Ser: 2.28 mg/dL — ABNORMAL HIGH (ref 0.40–1.50)
GFR: 34.96 mL/min — ABNORMAL LOW
Glucose, Bld: 72 mg/dL (ref 70–99)
Potassium: 4.2 meq/L (ref 3.5–5.1)
Sodium: 138 meq/L (ref 135–145)

## 2018-07-16 LAB — URINALYSIS, ROUTINE W REFLEX MICROSCOPIC
Bilirubin Urine: NEGATIVE
Ketones, ur: NEGATIVE
Leukocytes,Ua: NEGATIVE
Nitrite: NEGATIVE
Specific Gravity, Urine: 1.015 (ref 1.000–1.030)
Urine Glucose: NEGATIVE
Urobilinogen, UA: 0.2 (ref 0.0–1.0)
pH: 6 (ref 5.0–8.0)

## 2018-07-16 LAB — HEPATIC FUNCTION PANEL
ALT: 27 U/L (ref 0–53)
AST: 35 U/L (ref 0–37)
Albumin: 3.9 g/dL (ref 3.5–5.2)
Alkaline Phosphatase: 37 U/L — ABNORMAL LOW (ref 39–117)
Bilirubin, Direct: 0.2 mg/dL (ref 0.0–0.3)
Total Bilirubin: 1 mg/dL (ref 0.2–1.2)
Total Protein: 6.9 g/dL (ref 6.0–8.3)

## 2018-07-16 LAB — PSA: PSA: 2.74 ng/mL (ref 0.10–4.00)

## 2018-07-16 LAB — VITAMIN B12: Vitamin B-12: 615 pg/mL (ref 211–911)

## 2018-07-16 LAB — HEMOGLOBIN A1C: Hgb A1c MFr Bld: 4.7 % (ref 4.6–6.5)

## 2018-07-16 LAB — TSH: TSH: 3.58 u[IU]/mL (ref 0.35–4.50)

## 2018-07-16 LAB — LDL CHOLESTEROL, DIRECT: Direct LDL: 81 mg/dL

## 2018-07-16 LAB — POCT INR: INR: 3.5 — AB (ref 2.0–3.0)

## 2018-07-16 NOTE — Assessment & Plan Note (Signed)
stable overall by history and exam, recent data reviewed with pt, and pt to continue medical treatment as before,  to f/u any worsening symptoms or concernsk for a1c

## 2018-07-16 NOTE — Assessment & Plan Note (Signed)
stable overall by history and exam, recent data reviewed with pt, and pt to continue medical treatment as before,  to f/u any worsening symptoms or concerns  

## 2018-07-16 NOTE — Patient Instructions (Addendum)
Pre visit review using our clinic review tool, if applicable. No additional management support is needed unless otherwise documented below in the visit note.  Hold coumadin today and then change dosage and take 1 tablet daily except 1/2 tablet every Wednesday.  Re-check in 4 weeks. Encouraged patient to stop or decrease alcohol due to interaction with coumadin.

## 2018-07-16 NOTE — Assessment & Plan Note (Signed)
stable overall by history and exam, recent data reviewed with pt, and pt to continue medical treatment as before,  to f/u any worsening symptoms or concerns, for lipids

## 2018-07-16 NOTE — Patient Instructions (Signed)
Please continue all other medications as before, and refills have been done if requested.  Please have the pharmacy call with any other refills you may need.  Please continue your efforts at being more active, low cholesterol diet, and weight control.  You are otherwise up to date with prevention measures today.  Please keep your appointments with your specialists as you may have planned - and remember to call for your colonoscopy with Dr Ardis Hughs  Please go to the LAB in the Basement (turn left off the elevator) for the tests to be done today  You will be contacted by phone if any changes need to be made immediately.  Otherwise, you will receive a letter about your results with an explanation, but please check with MyChart first.  Please remember to sign up for MyChart if you have not done so, as this will be important to you in the future with finding out test results, communicating by private email, and scheduling acute appointments online when needed.  Please return in 6 months, or sooner if needed

## 2018-07-16 NOTE — Assessment & Plan Note (Signed)
Has some nocturia, o/w stable overall by history and exam, recent data reviewed with pt, and pt to continue medical treatment as before,  to f/u any worsening symptoms or concerns, for psa with labs

## 2018-07-16 NOTE — Progress Notes (Signed)
Subjective:    Patient ID: Charles [name], male    DOB: [birth year], 68 y.o.   MRN: [id number]   HPI Here for yearly f/u;  Overall doing ok;  Pt denies Chest pain, worsening SOB, DOE, wheezing, orthopnea, PND, worsening LE edema, palpitations, dizziness or syncope.  Pt denies neurological change such as new headache, facial or extremity weakness.  Pt denies polydipsia, polyuria, or low sugar symptoms. Pt states overall good compliance with treatment and medications, good tolerability, and has been trying to follow appropriate diet.  Pt denies worsening depressive symptoms, suicidal ideation or panic. No fever, night sweats, wt loss, loss of appetite, or other constitutional symptoms.  Pt states good ability with ADL's, has low fall risk, home safety reviewed and adequate, no other significant changes in hearing or vision, and only occasionally active with exercise. For INR check today.  Due for colonoscopy - pt plans to call Dr Ardis Hughs.  Past Medical History:  Diagnosis Date  . ABNORMAL ELECTROCARDIOGRAM 11/02/2008  . ABSCESS, LUNG 10/23/2006  . Anemia 05/26/2014  . BACTERIAL PNEUMONIA 12/28/2009  . BPH (benign prostatic hyperplasia) 11/22/2010  . 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 CHEST PAIN 12/24/2009  . Coronary artery calcification seen on CT scan 06/01/2015  . Cramp of limb 07/19/2007  . DIVERTICULOSIS, COLON 03/07/2007  . DVT (deep venous thrombosis) ([place]) 01/2014   LLE  . Emphysema of lung ([place])   . 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/23/2006  . FLANK PAIN, LEFT 02/25/2010  . FREQUENCY, URINARY 12/24/2009  . HEMORRHOIDS 10/23/2006  . HYPERLIPIDEMIA 10/23/2006  . HYPERTENSION 10/23/2006  . PE (pulmonary embolism) 02/2014  . PLANTAR FASCIITIS, LEFT 11/02/2008  . Pneumonia 12/2013 X 2  . PSA, INCREASED 11/20/2007  . PULMONARY NODULE 05/14/2008  . Unspecified Peripheral Vascular Disease 10/23/2006   Past Surgical History:  Procedure Laterality Date  . ESOPHAGOGASTRODUODENOSCOPY (EGD) WITH PROPOFOL N/A 06/15/2017   Procedure: ESOPHAGOGASTRODUODENOSCOPY (EGD) WITH PROPOFOL;  Surgeon: Jerene Bears, MD;  [facility];  Service: Gastroenterology;  Laterality: N/A;  . HEMORRHOID SURGERY  ?1990's  . INGUINAL HERNIA REPAIR Bilateral ?2000's  . IVC FILTER INSERTION N/A 10/03/2016   Procedure: IVC Filter Retrieveal;  Surgeon: Serafina Mitchell, MD;  Location: [place] [facility];  Service: Cardiovascular;  Laterality: N/A;  . SHOULDER ARTHROSCOPY W/ ROTATOR CUFF REPAIR Right 11/2013  . STAPLING OF BLEBS Right 07/31/2016   Procedure: BLEBECTOMY;  Surgeon: Melrose Nakayama, MD;  Location: [place];  Service: Thoracic;  Laterality: Right;  . VENA CAVA FILTER PLACEMENT Right 08/04/2016   Procedure: INSERTION VENA-CAVA FILTER;  Surgeon: Melrose Nakayama, MD;  Location: [facility];  Service: Thoracic;  Laterality: Right;  Marland Kitchen VIDEO ASSISTED THORACOSCOPY Right 07/31/2016   Procedure: VIDEO ASSISTED THORACOSCOPY;  Surgeon: Melrose Nakayama, MD;  Location: [facility];  Service: Thoracic;  Laterality: Right;  Marland Kitchen VIDEO ASSISTED THORACOSCOPY Right 08/04/2016   Procedure: REDO VIDEO ASSISTED THORACOSCOPY- EVACUATION OF HEMOTHORAX;  Surgeon: Melrose Nakayama, MD;  Location: [place];  Service: Thoracic;  Laterality: Right;  Marland Kitchen VIDEO BRONCHOSCOPY WITH ENDOBRONCHIAL NAVIGATION N/A 03/11/2014   Procedure: VIDEO BRONCHOSCOPY WITH ENDOBRONCHIAL NAVIGATION;  Surgeon: Collene Gobble, MD;  Location: [facility];  Service: Thoracic;  Laterality: N/A;    reports that he has quit smoking. His smoking use included cigarettes. He has a 15.00 pack-year smoking history. He has never used smokeless tobacco. He reports current alcohol use. He reports current drug use. Drug: Marijuana. family history includes Heart disease in his father and  mother; Kidney disease in his sister. No Known Allergies Current Outpatient Medications on File Prior to Visit  Medication Sig Dispense Refill  . amLODipine (NORVASC) 10 MG tablet Take 1 tablet (10 mg total) by  mouth daily. 30 tablet 5  . Diclofenac Sodium (PENNSAID) 2 % SOLN Place 1 application onto the skin 2 (two) times daily. 1 Bottle 3  . ezetimibe (ZETIA) 10 MG tablet Take 1 tablet (10 mg total) by mouth daily. 90 tablet 3  . predniSONE (DELTASONE) 5 MG tablet Take 6 pills for first day, 5 pills second day, 4 pills third day, 3 pills fourth day, 2 pills the fifth day, and 1 pill sixth day. 21 tablet 0  . rosuvastatin (CRESTOR) 40 MG tablet Take 1 tablet (40 mg total) by mouth daily. 30 tablet 5  . tadalafil (ADCIRCA/CIALIS) 20 MG tablet TAKE 1 TABLET BY MOUTH ONCE DAILY AS NEEDED FOR  ERECTILE  DYSFUNCTION 10 tablet 11  . temazepam (RESTORIL) 15 MG capsule TAKE 1 TO 2 CAPSULES BY MOUTH AT BEDTIME AS NEEDED FOR SLEEP 60 capsule 2  . warfarin (COUMADIN) 5 MG tablet TAKE 1 TABLET BY MOUTH ONCE DAILY EXCEPT  2  TABS  ON  SUNDAYS,  TUESDAYS,  AND  THURSDAYS OR AS  DIRECTED BY ANTICOAGULATION CLINIC  90 day 130 tablet 1   No current facility-administered medications on file prior to visit.    Review of Systems Constitutional: Negative for other unusual diaphoresis, sweats, appetite or weight changes HENT: Negative for other worsening hearing loss, ear pain, facial swelling, mouth sores or neck stiffness.   Eyes: Negative for other worsening pain, redness or other visual disturbance.  Respiratory: Negative for other stridor or swelling Cardiovascular: Negative for other palpitations or other chest pain  Gastrointestinal: Negative for worsening diarrhea or loose stools, blood in stool, distention or other pain Genitourinary: Negative for hematuria, flank pain or other change in urine volume.  Musculoskeletal: Negative for myalgias or other joint swelling.  Skin: Negative for other color change, or other wound or worsening drainage.  Neurological: Negative for other syncope or numbness. Hematological: Negative for other adenopathy or swelling Psychiatric/Behavioral: Negative for hallucinations, other  worsening agitation, SI, self-injury, or new decreased concentration All other system neg per pt    Objective:   Physical Exam BP 136/82   Pulse 81   Temp 98.1 F (36.7 C) (Oral)   Ht 6\' 4"  (1.93 m)   Wt 180 lb (81.6 kg)   SpO2 97%   BMI 21.91 kg/m  VS noted,  Constitutional: Pt is oriented to person, place, and time. Appears well-developed and well-nourished, in no significant distress and comfortable Head: Normocephalic and atraumatic  Eyes: Conjunctivae and EOM are normal. Pupils are equal, round, and reactive to light Right Ear: External ear normal without discharge Left Ear: External ear normal without discharge Nose: Nose without discharge or deformity Mouth/Throat: Oropharynx is without other ulcerations and moist  Neck: Normal range of motion. Neck supple. No JVD present. No tracheal deviation present or significant neck LA or mass Cardiovascular: Normal rate, regular rhythm, normal heart sounds and intact distal pulses.   Pulmonary/Chest: WOB normal and breath sounds without rales or wheezing  Abdominal: Soft. Bowel sounds are normal. NT. No HSM  Musculoskeletal: Normal range of motion. Exhibits no edema Lymphadenopathy: Has no other cervical adenopathy.  Neurological: Pt is alert and oriented to person, place, and time. Pt has normal reflexes. No cranial nerve deficit. Motor grossly intact, Gait intact Skin: Skin is  warm and dry. No rash noted or new ulcerations Psychiatric:  Has mild nervous  mood and affect. Behavior is normal without agitation No other exam findings Lab Results  Component Value Date   WBC 10.0 07/13/2017   HGB 9.2 (L) 07/13/2017   HCT 26.7 (L) 07/13/2017   PLT 231.0 07/13/2017   GLUCOSE 87 06/29/2017   CHOL 185 07/13/2017   TRIG 165.0 (H) 07/13/2017   HDL 47.40 07/13/2017   LDLDIRECT 91.0 06/13/2017   LDLCALC 105 (H) 07/13/2017   ALT 17 06/27/2017   AST 19 06/27/2017   NA 140 06/29/2017   K 4.2 06/29/2017   CL 112 (H) 06/29/2017    CREATININE 6.72 (H) 06/29/2017   BUN 57 (H) 06/29/2017   CO2 18 (L) 06/29/2017   TSH 2.917 06/14/2017   PSA 1.87 07/13/2017   INR 3.5 (A) 07/16/2018   HGBA1C 4.6 06/13/2017   MICROALBUR 1.5 08/25/2015       Assessment & Plan:

## 2018-07-16 NOTE — Assessment & Plan Note (Signed)
No overt bleeding, for f/u cbc, iron

## 2018-07-17 ENCOUNTER — Other Ambulatory Visit: Payer: Self-pay | Admitting: Internal Medicine

## 2018-07-17 ENCOUNTER — Ambulatory Visit: Payer: Medicare Other | Admitting: Internal Medicine

## 2018-07-17 MED ORDER — VITAMIN D (ERGOCALCIFEROL) 1.25 MG (50000 UNIT) PO CAPS: 50000.0000 [IU] | ORAL_CAPSULE | ORAL | 0 refills | Status: DC

## 2018-07-18 ENCOUNTER — Telehealth: Payer: Self-pay | Admitting: *Deleted

## 2018-07-18 MED ORDER — TIZANIDINE HCL 2 MG PO TABS: 2.0000 mg | ORAL_TABLET | Freq: Two times a day (BID) | ORAL | 1 refills | Status: DC | PRN

## 2018-07-18 NOTE — Telephone Encounter (Signed)
The pt was correct, the rx is now sent

## 2018-07-18 NOTE — Telephone Encounter (Signed)
Patient called for lab results and while on the phone he asked if the muscle relaxor dicussed at appointment had been called to pharmacy. Told patient will send note to PCP for review- no mention in chart- may have slipped mind.

## 2018-07-18 NOTE — Addendum Note (Signed)
Addended by: Biagio Borg on: 07/18/2018 12:50 PM   Modules accepted: Orders

## 2018-08-13 ENCOUNTER — Ambulatory Visit: Payer: Medicare Other

## 2018-08-13 ENCOUNTER — Telehealth: Payer: Self-pay

## 2018-08-13 ENCOUNTER — Ambulatory Visit (INDEPENDENT_AMBULATORY_CARE_PROVIDER_SITE_OTHER): Payer: Medicare Other | Admitting: General Practice

## 2018-08-13 ENCOUNTER — Other Ambulatory Visit (INDEPENDENT_AMBULATORY_CARE_PROVIDER_SITE_OTHER): Payer: Medicare Other

## 2018-08-13 DIAGNOSIS — Z7901 Long term (current) use of anticoagulants: Secondary | ICD-10-CM

## 2018-08-13 DIAGNOSIS — I2699 Other pulmonary embolism without acute cor pulmonale: Secondary | ICD-10-CM | POA: Diagnosis not present

## 2018-08-13 LAB — PROTIME-INR
INR: 1.4 ratio — ABNORMAL HIGH (ref 0.8–1.0)
Prothrombin Time: 15.9 s — ABNORMAL HIGH (ref 9.6–13.1)

## 2018-08-13 NOTE — Patient Instructions (Addendum)
Pre visit review using our clinic review tool, if applicable. No additional management support is needed unless otherwise documented below in the visit note.  Take 1 1/2 tablets today (6/23) and take 1 tablet tomorrow (6/24).  On Thursday change dosage and take 1 (5 mg) tablet daily.  Re-check INR in 7 to 10 days.  Dosing instructions left on VM  Also requested a return call to make sure pt received the message.

## 2018-08-13 NOTE — Telephone Encounter (Signed)
error 

## 2018-08-18 NOTE — Progress Notes (Signed)
Medical screening examination/treatment/procedure(s) were performed by non-physician practitioner and as supervising physician I was immediately available for consultation/collaboration. I agree with above. Crosby Bevan, MD   

## 2018-08-20 ENCOUNTER — Ambulatory Visit: Payer: Medicare Other

## 2018-08-30 ENCOUNTER — Ambulatory Visit (INDEPENDENT_AMBULATORY_CARE_PROVIDER_SITE_OTHER): Payer: Medicare Other | Admitting: General Practice

## 2018-08-30 ENCOUNTER — Other Ambulatory Visit: Payer: Self-pay

## 2018-08-30 DIAGNOSIS — Z7901 Long term (current) use of anticoagulants: Secondary | ICD-10-CM | POA: Diagnosis not present

## 2018-08-30 DIAGNOSIS — I2699 Other pulmonary embolism without acute cor pulmonale: Secondary | ICD-10-CM

## 2018-08-30 LAB — POCT INR: INR: 1.7 — AB (ref 2.0–3.0)

## 2018-08-30 NOTE — Patient Instructions (Addendum)
Pre visit review using our clinic review tool, if applicable. No additional management support is needed unless otherwise documented below in the visit note.  Take 1 1/2 tablets today (7/10) and then start taking 1 tablet daily except 1 1/2 tablets every Friday.  Re-check in 3 weeks.

## 2018-09-20 ENCOUNTER — Encounter: Payer: Self-pay | Admitting: Gastroenterology

## 2018-09-20 ENCOUNTER — Telehealth: Payer: Self-pay

## 2018-09-20 ENCOUNTER — Ambulatory Visit (INDEPENDENT_AMBULATORY_CARE_PROVIDER_SITE_OTHER): Payer: Medicare Other | Admitting: Gastroenterology

## 2018-09-20 ENCOUNTER — Other Ambulatory Visit: Payer: Self-pay

## 2018-09-20 ENCOUNTER — Ambulatory Visit (INDEPENDENT_AMBULATORY_CARE_PROVIDER_SITE_OTHER): Payer: Medicare Other | Admitting: General Practice

## 2018-09-20 VITALS — Ht 72.0 in | Wt 180.0 lb

## 2018-09-20 DIAGNOSIS — Z7901 Long term (current) use of anticoagulants: Secondary | ICD-10-CM

## 2018-09-20 DIAGNOSIS — I2699 Other pulmonary embolism without acute cor pulmonale: Secondary | ICD-10-CM

## 2018-09-20 DIAGNOSIS — Z86010 Personal history of colonic polyps: Secondary | ICD-10-CM

## 2018-09-20 LAB — POCT INR: INR: 5.7 — AB (ref 2.0–3.0)

## 2018-09-20 MED ORDER — PEG 3350-KCL-NA BICARB-NACL 420 G PO SOLR: 4000.0000 mL | ORAL | 0 refills | Status: DC

## 2018-09-20 NOTE — Progress Notes (Signed)
Review of pertinent gastrointestinal problems: 1.  Personal history of adenomatous colon polyps Colonoscopy, Dr. Ardis Hughs September 2006 for routine screening found diverticulosis in his left colon. Colonoscopy was otherwise normal. He was recommended to have repeat colonoscopy in 10 years for routine risk screening.  Colonoscopy Dr. Ardis Hughs May 2017 found 6 subcentimeter adenomas.  Also pan colonic diverticulosis and hemorrhoids.  This service was provided via virtual visit.  only audio was used.  The patient was located at home.  I was located in my office.  The patient did consent to this virtual visit and is aware of possible charges through their insurance for this visit.  The patient is an established patient.  My certified medical assistant, Grace Bushy, contributed to this visit by contacting the patient by phone 1 or 2 business days prior to the appointment and also followed up on the recommendations I made after the visit.  Time spent on virtual visit: 18 minutes   HPI: [name] whom I last saw about 3 years ago the time of previous colonoscopy see those results summarized above.  Since then he has had no GI troubles with his bowel.  Specifically no bleeding, no constipation, no diarrhea no abdominal pains.  He is on Coumadin for blood clots and will probably be on it for the rest of his life  Chief complaint is personal history of adenomatous colon polyps  ROS: complete GI ROS as described in HPI, all other review negative.  Constitutional:  No unintentional weight loss   Past Medical History:  Diagnosis Date  . ABNORMAL ELECTROCARDIOGRAM 11/02/2008  . ABSCESS, LUNG 10/23/2006  . Anemia 05/26/2014  . BACTERIAL PNEUMONIA 12/28/2009  . BPH (benign prostatic hyperplasia) 11/22/2010  . 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 CHEST PAIN 12/24/2009  . Coronary artery calcification seen on CT scan 06/01/2015  . Cramp of limb 07/19/2007  . DIVERTICULOSIS, COLON  03/07/2007  . DVT (deep venous thrombosis) ([place]) 01/2014   LLE  . Emphysema of lung ([place])   . 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/23/2006  . FLANK PAIN, LEFT 02/25/2010  . FREQUENCY, URINARY 12/24/2009  . HEMORRHOIDS 10/23/2006  . HYPERLIPIDEMIA 10/23/2006  . HYPERTENSION 10/23/2006  . PE (pulmonary embolism) 02/2014  . PLANTAR FASCIITIS, LEFT 11/02/2008  . Pneumonia 12/2013 X 2  . PSA, INCREASED 11/20/2007  . PULMONARY NODULE 05/14/2008  . Unspecified Peripheral Vascular Disease 10/23/2006    Past Surgical History:  Procedure Laterality Date  . ESOPHAGOGASTRODUODENOSCOPY (EGD) WITH PROPOFOL N/A 06/15/2017   Procedure: ESOPHAGOGASTRODUODENOSCOPY (EGD) WITH PROPOFOL;  Surgeon: Jerene Bears, MD;  [facility];  Service: Gastroenterology;  Laterality: N/A;  . HEMORRHOID SURGERY  ?1990's  . INGUINAL HERNIA REPAIR Bilateral ?2000's  . IVC FILTER INSERTION N/A 10/03/2016   Procedure: IVC Filter Retrieveal;  Surgeon: Serafina Mitchell, MD;  Location: [facility];  Service: Cardiovascular;  Laterality: N/A;  . SHOULDER ARTHROSCOPY W/ ROTATOR CUFF REPAIR Right 11/2013  . STAPLING OF BLEBS Right 07/31/2016   Procedure: BLEBECTOMY;  Surgeon: Melrose Nakayama, MD;  Location: [place];  Service: Thoracic;  Laterality: Right;  . VENA CAVA FILTER PLACEMENT Right 08/04/2016   Procedure: INSERTION VENA-CAVA FILTER;  Surgeon: Melrose Nakayama, MD;  Location: [facility];  Service: Thoracic;  Laterality: Right;  Marland Kitchen VIDEO ASSISTED THORACOSCOPY Right 07/31/2016   Procedure: VIDEO ASSISTED THORACOSCOPY;  Surgeon: Melrose Nakayama, MD;  Location: [facility];  Service: Thoracic;  Laterality: Right;  Marland Kitchen VIDEO ASSISTED THORACOSCOPY Right 08/04/2016  Procedure: REDO VIDEO ASSISTED THORACOSCOPY- EVACUATION OF HEMOTHORAX;  Surgeon: Melrose Nakayama, MD;  Location: [facility];  Service: Thoracic;  Laterality: Right;  Marland Kitchen VIDEO BRONCHOSCOPY WITH ENDOBRONCHIAL NAVIGATION N/A 03/11/2014   Procedure: VIDEO BRONCHOSCOPY WITH  ENDOBRONCHIAL NAVIGATION;  Surgeon: Collene Gobble, MD;  Location[facility];  Service: Thoracic;  Laterality: N/A;    Current Outpatient Medications  Medication Sig Dispense Refill  . amLODipine (NORVASC) 10 MG tablet Take 1 tablet (10 mg total) by mouth daily. 30 tablet 5  . Diclofenac Sodium (PENNSAID) 2 % SOLN Place 1 application onto the skin 2 (two) times daily. 1 Bottle 3  . ezetimibe (ZETIA) 10 MG tablet Take 1 tablet (10 mg total) by mouth daily. 90 tablet 3  . predniSONE (DELTASONE) 5 MG tablet Take 6 pills for first day, 5 pills second day, 4 pills third day, 3 pills fourth day, 2 pills the fifth day, and 1 pill sixth day. 21 tablet 0  . rosuvastatin (CRESTOR) 40 MG tablet Take 1 tablet (40 mg total) by mouth daily. 30 tablet 5  . tadalafil (ADCIRCA/CIALIS) 20 MG tablet TAKE 1 TABLET BY MOUTH ONCE DAILY AS NEEDED FOR  ERECTILE  DYSFUNCTION 10 tablet 11  . temazepam (RESTORIL) 15 MG capsule TAKE 1 TO 2 CAPSULES BY MOUTH AT BEDTIME AS NEEDED FOR SLEEP 60 capsule 2  . tiZANidine (ZANAFLEX) 2 MG tablet Take 1 tablet (2 mg total) by mouth 2 (two) times daily as needed for muscle spasms. 60 tablet 1  . Vitamin D, Ergocalciferol, (DRISDOL) 1.25 MG (50000 UT) CAPS capsule Take 1 capsule (50,000 Units total) by mouth every 7 (seven) days. 12 capsule 0  . warfarin (COUMADIN) 5 MG tablet TAKE 1 TABLET BY MOUTH ONCE DAILY EXCEPT  2  TABS  ON  SUNDAYS,  TUESDAYS,  AND  THURSDAYS OR AS  DIRECTED BY ANTICOAGULATION CLINIC  90 day 130 tablet 1   No current facility-administered medications for this visit.     Allergies as of 09/20/2018  . (No Known Allergies)    Family History  Problem Relation Age of Onset  . Heart disease Mother   . Heart disease Father   . Kidney disease Sister        Renal transplant  . Colon cancer Neg Hx     Social History   Socioeconomic History  . Marital status: Married    Spouse name: Not on file  . Number of children: 2  . Years of education: Not on file  .  Highest education level: Not on file  Occupational History  . Occupation: retired  Scientific laboratory technician  . Financial resource strain: Not on file  . Food insecurity    Worry: Not on file    Inability: Not on file  . Transportation needs    Medical: Not on file    Non-medical: Not on file  Tobacco Use  . Smoking status: Former Smoker    Packs/day: 1.00    Years: 15.00    Pack years: 15.00    Types: Cigarettes  . Smokeless tobacco: Never Used  . Tobacco comment: "quit smoking cigarettes in the 1990's"  Substance and Sexual Activity  . Alcohol use: Yes    Alcohol/week: 0.0 standard drinks    Comment: patient states he stopped 4-5 days ago  . 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 Sexual activity: Yes  Lifestyle  . Physical activity    Days per week:  Not on file    Minutes per session: Not on file  . Stress: Not on file  Relationships  . Social Herbalist on phone: Not on file    Gets together: Not on file    Attends religious service: Not on file    Active member of club or organization: Not on file    Attends meetings of clubs or organizations: Not on file    Relationship status: Not on file  . Intimate partner violence    Fear of current or ex partner: Not on file    Emotionally abused: Not on file    Physically abused: Not on file    Forced sexual activity: Not on file  Other Topics Concern  . Not on file  Social History Narrative  . Not on file     Physical Exam: Unable to perform because this was a "telemed visit" due to current Covid-19 pandemic  Assessment and plan: 68 y.o. male with personal history of adenomatous colon polyps, also chronic blood thinner use  He understands that he is at slightly increased risk for bleeding complications after a colonoscopy given his chronic blood thinner use.  He needs a surveillance colonoscopy given the 6 adenomatous polyps that I removed 3 years ago from his colon.  We will reach out to his  primary care physician about the safety of him stopping his Coumadin for 5 days prior to the colonoscopy.  I see no reason for any further blood tests or imaging studies prior to then.  Please see the "Patient Instructions" section for addition details about the plan.  Owens Loffler, MD [facility] 09/20/2018, 3:28 PM

## 2018-09-20 NOTE — Telephone Encounter (Signed)
OK for colonoscopy with holding the coumadin x 5 days prior; will ask Jenny Reichmann for lovenox bridge as well  Jenny Reichmann - can we do lovenox bridge?

## 2018-09-20 NOTE — Patient Instructions (Addendum)
Pre visit review using our clinic review tool, if applicable. No additional management support is needed unless otherwise documented below in the visit note.  Do not take coumadin today, tomorrow or Sunday.  On Monday take 1 tablet and re-check on Tuesday.

## 2018-09-20 NOTE — Telephone Encounter (Signed)
[facility] Pre-operative Risk Assessment     Request for surgical clearance:     Endoscopy Procedure  What type of surgery is being performed?     colonoscopy  When is this surgery scheduled?     10/21/18  What type of clearance is required ?   Pharmacy  Are there any medications that need to be held prior to surgery and how long? Coumadin  Practice name and name of physician performing surgery?      [facility]  What is your office phone and fax number?      Phone- [phone]  [phone]  Anesthesia type (None, local, MAC, general) ?       MAC

## 2018-09-20 NOTE — Patient Instructions (Addendum)
We will arrange a colonoscopy to be performed at his soonest convenience for history of adenomatous colon polyps   We will contact Dr. Jenny Reichmann, his PCP, who prescribes his Coumadin for history of blood clots to make sure that he feels that is safe for him to come off of Coumadin for 5 days prior to the colonoscopy.  Thank you for entrusting me with your care and choosing [facility].  Dr Ardis Hughs

## 2018-09-23 NOTE — Telephone Encounter (Signed)
Spoke to patient. He understands to hold Coumadin 5 days prior to colonoscopy on 10/21/18 per dr Jenny Reichmann

## 2018-09-24 ENCOUNTER — Other Ambulatory Visit: Payer: Self-pay

## 2018-09-24 ENCOUNTER — Ambulatory Visit (INDEPENDENT_AMBULATORY_CARE_PROVIDER_SITE_OTHER): Payer: Medicare Other | Admitting: General Practice

## 2018-09-24 DIAGNOSIS — Z7901 Long term (current) use of anticoagulants: Secondary | ICD-10-CM

## 2018-09-24 LAB — POCT INR: INR: 1.8 — AB (ref 2.0–3.0)

## 2018-09-24 NOTE — Patient Instructions (Addendum)
Pre visit review using our clinic review tool, if applicable. No additional management support is needed unless otherwise documented below in the visit note.  Resume taking 1 tablet daily.  Re-check on 8/21.

## 2018-09-26 ENCOUNTER — Telehealth: Payer: Self-pay | Admitting: General Practice

## 2018-09-26 NOTE — Telephone Encounter (Signed)
-----   Message from Biagio Borg, MD sent at 09/25/2018  4:56 PM EDT ----- Regarding: RE: Lovenox bridge [place] for the 1.5 dosing,  Thanks ----- Message ----- From: Warden Fillers, RN Sent: 09/25/2018   4:13 PM EDT To: Biagio Borg, MD Subject: Lovenox bridge                                 Dr. Jenny Reichmann,  I am working on dosing Lovenox for patient.  His CrCl is 36.  Are you OK with normal dosing at 1.5mg /kg Q24 or would reduced dosing at 33mh/kg Q24 be more appropriate?  Please advise.  Last SrCr was 2.28 in May of this year.  Villa Herb,  RN

## 2018-09-27 ENCOUNTER — Telehealth: Payer: Self-pay | Admitting: General Practice

## 2018-09-27 NOTE — Telephone Encounter (Signed)
Instructions for coumadin and Lovenox pre and post procedure on 8/31.  8/26 - Last dose of coumadin until after procedure. 8/27 - Nothing (No coumadin and No Lovenox) 8/28 - Lovenox in the AM 8/29 - Lovenox in the AM 8/30 - Lovenox in the AM (Take by 7 am) 8/31 - Procedure (DO NOT TAKE LOVENOX TODAY) 9/1 - Lovenox in the AM and 1 1/2 tablets of coumadin 9/2 - Lovenox in the AM and 1 1/2 tablets of coumadin 9/3 - Lovenox in the AM and 1 1/2 tablets of coumadin 9/4 - Lovenox and 1 tablet of coumadin 9/5 - Stop Lovenox and continue taking coumadin 1 tablet daily.  9/11 - Check INR with Villa Herb, RN

## 2018-10-10 ENCOUNTER — Other Ambulatory Visit: Payer: Self-pay | Admitting: General Practice

## 2018-10-10 DIAGNOSIS — Z7901 Long term (current) use of anticoagulants: Secondary | ICD-10-CM

## 2018-10-10 MED ORDER — ENOXAPARIN SODIUM 120 MG/0.8ML ~~LOC~~ SOLN: 120.0000 mg | SUBCUTANEOUS | 0 refills | Status: DC

## 2018-10-11 ENCOUNTER — Ambulatory Visit (INDEPENDENT_AMBULATORY_CARE_PROVIDER_SITE_OTHER): Payer: Medicare Other | Admitting: General Practice

## 2018-10-11 ENCOUNTER — Other Ambulatory Visit: Payer: Self-pay

## 2018-10-11 ENCOUNTER — Telehealth: Payer: Self-pay | Admitting: Internal Medicine

## 2018-10-11 ENCOUNTER — Other Ambulatory Visit: Payer: Self-pay | Admitting: Internal Medicine

## 2018-10-11 DIAGNOSIS — Z7901 Long term (current) use of anticoagulants: Secondary | ICD-10-CM

## 2018-10-11 DIAGNOSIS — I2699 Other pulmonary embolism without acute cor pulmonale: Secondary | ICD-10-CM

## 2018-10-11 LAB — POCT INR: INR: 5.9 — AB (ref 2.0–3.0)

## 2018-10-11 NOTE — Telephone Encounter (Signed)
Done erx 

## 2018-10-11 NOTE — Telephone Encounter (Signed)
Copied from [place] [id number]. Topic: General - Other >> Oct 11, 2018  9:13 AM Keene Breath wrote: Reason for [id number] called to get clarification on patient medication enoxaparin (LOVENOX) 120 MG/0.8ML injection.  Please call pharmacy to discuss at [phone]

## 2018-10-11 NOTE — Patient Instructions (Addendum)
Pre visit review using our clinic review tool, if applicable. No additional management support is needed unless otherwise documented below in the visit note.  Hold coumadin until after procedure.  Please follow patient instructions.     Instructions for coumadin and Lovenox pre and post procedure on 8/31.  8/26 - Last dose of coumadin until after procedure. 8/27 - Nothing (No coumadin and No Lovenox) 8/28 - Lovenox in the AM 8/29 - Lovenox in the AM 8/30 - Lovenox in the AM (Take by 7 am) 8/31 - Procedure (DO NOT TAKE LOVENOX TODAY) 9/1 - Lovenox in the AM and 1 1/2 tablets of coumadin 9/2 - Lovenox in the AM and 1 1/2 tablets of coumadin 9/3 - Lovenox in the AM and 1 1/2 tablets of coumadin 9/4 - Lovenox and 1 tablet of coumadin 9/5 - Stop Lovenox and continue taking coumadin 1 tablet daily.  9/11 - Check INR with Villa Herb, RN

## 2018-10-11 NOTE — Telephone Encounter (Signed)
[place] for cindy to address, thanks

## 2018-10-14 NOTE — Telephone Encounter (Signed)
Spoke with pharmacist at [facility].  Patient will be receiving 7 - 120 mg Lovenox syringes.

## 2018-10-18 ENCOUNTER — Telehealth: Payer: Self-pay | Admitting: Gastroenterology

## 2018-10-18 NOTE — Telephone Encounter (Signed)
Covid-19 screening questions °  °  °Do you now or have you had a fever in the last 14 days?  NO °  °Do you have any respiratory symptoms of shortness of breath or cough now or in the last 14 days?  NO °  °Do you have any family members or close contacts with diagnosed or suspected Covid-19 in the past 14 days? NO °  °Have you been tested for Covid-19 and found to be positive? NO ° °Advised to check in on the 4th floor and that care partner may wait in the car or come up to the lobby during procedure. Also made aware that both must enter the building wearing a mask. °

## 2018-10-21 ENCOUNTER — Encounter: Payer: Self-pay | Admitting: Gastroenterology

## 2018-10-21 ENCOUNTER — Other Ambulatory Visit: Payer: Self-pay

## 2018-10-21 ENCOUNTER — Ambulatory Visit (AMBULATORY_SURGERY_CENTER): Payer: Medicare Other | Admitting: Gastroenterology

## 2018-10-21 VITALS — BP 126/71 | HR 62 | Temp 97.7°F | Resp 15 | Ht 72.0 in | Wt 180.0 lb

## 2018-10-21 DIAGNOSIS — D123 Benign neoplasm of transverse colon: Secondary | ICD-10-CM

## 2018-10-21 DIAGNOSIS — D120 Benign neoplasm of cecum: Secondary | ICD-10-CM | POA: Diagnosis not present

## 2018-10-21 DIAGNOSIS — D122 Benign neoplasm of ascending colon: Secondary | ICD-10-CM

## 2018-10-21 DIAGNOSIS — D124 Benign neoplasm of descending colon: Secondary | ICD-10-CM | POA: Diagnosis not present

## 2018-10-21 DIAGNOSIS — K5730 Diverticulosis of large intestine without perforation or abscess without bleeding: Secondary | ICD-10-CM

## 2018-10-21 DIAGNOSIS — Z86010 Personal history of colonic polyps: Secondary | ICD-10-CM

## 2018-10-21 DIAGNOSIS — K649 Unspecified hemorrhoids: Secondary | ICD-10-CM

## 2018-10-21 DIAGNOSIS — K635 Polyp of colon: Secondary | ICD-10-CM

## 2018-10-21 MED ORDER — SODIUM CHLORIDE 0.9 % IV SOLN: 500.0000 mL | Freq: Once | INTRAVENOUS | Status: DC

## 2018-10-21 NOTE — Progress Notes (Signed)
Called to room to assist during endoscopic procedure.  Patient ID and intended procedure confirmed with present staff. Received instructions for my participation in the procedure from the performing physician.  

## 2018-10-21 NOTE — Patient Instructions (Signed)
You may resume your coumadin today, and we will see you again in three years.  Thank-you for choosing [place] for your medical needs today.  YOU HAD AN ENDOSCOPIC PROCEDURE TODAY AT [facility]:   Refer to the procedure report that was given to you for any specific questions about what was found during the examination.  If the procedure report does not answer your questions, please call your gastroenterologist to clarify.  If you requested that your care partner not be given the details of your procedure findings, then the procedure report has been included in a sealed envelope for you to review at your convenience later.  YOU SHOULD EXPECT: Some feelings of bloating in the abdomen. Passage of more gas than usual.  Walking can help get rid of the air that was put into your GI tract during the procedure and reduce the bloating. If you had a lower endoscopy (such as a colonoscopy or flexible sigmoidoscopy) you may notice spotting of blood in your stool or on the toilet paper. If you underwent a bowel prep for your procedure, you may not have a normal bowel movement for a few days.  Please Note:  You might notice some irritation and congestion in your nose or some drainage.  This is from the oxygen used during your procedure.  There is no need for concern and it should clear up in a day or so.  SYMPTOMS TO REPORT IMMEDIATELY:   Following lower endoscopy (colonoscopy or flexible sigmoidoscopy):  Excessive amounts of blood in the stool  Significant tenderness or worsening of abdominal pains  Swelling of the abdomen that is new, acute  Fever of 100F or higher   Black, tarry-looking stools  For urgent or emergent issues, a gastroenterologist can be reached at any hour by calling [phone].   DIET:  We do recommend a small meal at first, but then you may proceed to your regular diet.  Drink plenty of fluids but you should avoid alcoholic beverages for 24 hours. Try to increase the  fiber in your diet, and drink plenty of water.  ACTIVITY:  You should plan to take it easy for the rest of today and you should NOT DRIVE or use heavy machinery until tomorrow (because of the sedation medicines used during the test).    FOLLOW UP: Our staff will call the number listed on your records 48-72 hours following your procedure to check on you and address any questions or concerns that you may have regarding the information given to you following your procedure. If we do not reach you, we will leave a message.  We will attempt to reach you two times.  During this call, we will ask if you have developed any symptoms of COVID 19. If you develop any symptoms (ie: fever, flu-like symptoms, shortness of breath, cough etc.) before then, please call [phone].  If you test positive for Covid 19 in the 2 weeks post procedure, please call and report this information to [place].    If any biopsies were taken you will be contacted by phone or by letter within the next 1-3 weeks.  Please call us at [phone] if you have not heard about the biopsies in 3 weeks.    SIGNATURES/CONFIDENTIALITY: You and/or your care partner have signed paperwork which will be entered into your electronic medical record.  These signatures attest to the fact that that the information above on your After Visit Summary has been reviewed and is understood.  Full responsibility of the confidentiality of this discharge information lies with you and/or your care-partner.

## 2018-10-21 NOTE — Progress Notes (Signed)
PT taken to PACU. Monitors in place. VSS. Report given to RN. 

## 2018-10-21 NOTE — Progress Notes (Signed)
Pt's states no medical or surgical changes since previsit or office visit.  JB temps and Scott vitals. SM 

## 2018-10-21 NOTE — Op Note (Addendum)
[name] Procedure Date: 10/21/2018 8:31 AM MRN: [id number] Endoscopist: Milus Banister , MD Age: 68 Referring MD:  Date of Birth: 11/21/1950 Gender: Male Account #: [id number] Procedure:                Colonoscopy Indications:              High risk colon cancer surveillance: Personal                            history of colonic polyps; Colonoscopy, Dr. Ardis Hughs                            September 2006 for routine screening found                            diverticulosis in his left colon. Colonoscopy was                            otherwise normal. He was recommended to have repeat                            colonoscopy in 10 years for routine risk screening.                            Colonoscopy Dr. Ardis Hughs May 2017 found 6                            subcentimeter adenomas. Also pan colonic                            diverticulosis and hemorrhoids. Medicines:                Monitored Anesthesia Care Procedure:                Pre-Anesthesia Assessment:                           - Prior to the procedure, a History and Physical                            was performed, and patient medications and                            allergies were reviewed. The patient's tolerance of                            previous anesthesia was also reviewed. The risks                            and benefits of the procedure and the sedation                            options and risks were discussed with the patient.  All questions were answered, and informed consent                            was obtained. Prior Anticoagulants: The patient has                            taken Coumadin (warfarin), last dose was 5 days                            prior to procedure. ASA Grade Assessment: III - A                            patient with severe systemic disease. After                            reviewing the risks and benefits, the patient was                             deemed in satisfactory condition to undergo the                            procedure.                           After obtaining informed consent, the colonoscope                            was passed under direct vision. Throughout the                            procedure, the patient's blood pressure, pulse, and                            oxygen saturations were monitored continuously. The                            Colonoscope was introduced through the anus and                            advanced to the the cecum, identified by                            appendiceal orifice and ileocecal valve. The                            colonoscopy was performed without difficulty. The                            patient tolerated the procedure well. The quality                            of the bowel preparation was good. The ileocecal  valve, appendiceal orifice, and rectum were                            photographed. Scope In: 8:35:12 AM Scope Out: 8:49:16 AM Scope Withdrawal Time: 0 hours 10 minutes 45 seconds  Total Procedure Duration: 0 hours 14 minutes 4 seconds  Findings:                 Four sessile polyps were found in the descending                            colon, transverse colon, ascending colon and cecum.                            The polyps were 3 to 4 mm in size. These polyps                            were removed with a cold snare. Resection and                            retrieval were complete.                           A 12 mm polyp was found in the cecum. The polyp was                            sessile. The polyp was removed with a hot snare.                            Resection and retrieval were complete.                           Multiple small and large-mouthed diverticula were                            found in the entire colon.                           External and internal hemorrhoids were found. The                             hemorrhoids were medium-sized.                           The exam was otherwise without abnormality on                            direct and retroflexion views. Complications:            No immediate complications. Estimated blood loss:                            None. Estimated Blood Loss:     Estimated blood loss: none. Impression:               - Four 3 to 4 mm  polyps in the descending colon, in                            the transverse colon, in the ascending colon and in                            the cecum, removed with a cold snare. Resected and                            retrieved.                           - One 12 mm polyp in the cecum, removed with a hot                            snare. Resected and retrieved.                           - Diverticulosis in the entire examined colon.                           - External and internal hemorrhoids.                           - The examination was otherwise normal on direct                            and retroflexion views. Recommendation:           - Patient has a contact number available for                            emergencies. The signs and symptoms of potential                            delayed complications were discussed with the                            patient. Return to normal activities tomorrow.                            Written discharge instructions were provided to the                            patient.                           - Resume previous diet.                           - Continue present medications. OK to resume                            coumadin today.                           -  Await pathology results. LIkely repeat                            colonoscopy in 3 years. Milus Banister, MD 10/21/2018 8:52:49 AM This report has been signed electronically.

## 2018-10-23 ENCOUNTER — Telehealth: Payer: Self-pay

## 2018-10-23 NOTE — Telephone Encounter (Signed)
  Follow up Call-  Call back number 10/21/2018  Post procedure Call Back phone  # [phone]  Permission to leave phone message Yes  Some recent data might be hidden     Patient questions:  Do you have a fever, pain , or abdominal swelling? No. Pain Score  0 *  Have you tolerated food without any problems? Yes.  Have you been able to return to your normal activities? Yes.    Do you have any questions about your discharge instructions: Diet   No. Medications  No. Follow up visit  No.  Do you have questions or concerns about your Care? No.  Actions: * If pain score is 4 or above: No action needed, pain <4.  Have you developed a fever since your procedure? No 2.   Have you had an respiratory symptoms (SOB or cough) since your procedure? No  3.   Have you tested positive for COVID 19 since your procedure No  4.   Have you had any family members/close contacts diagnosed with the COVID 19 since your procedure?  No   If yes to any of these questions please route to Joylene John, RN and Alphonsa Gin, RN.

## 2018-10-25 ENCOUNTER — Encounter: Payer: Self-pay | Admitting: Gastroenterology

## 2018-11-01 ENCOUNTER — Other Ambulatory Visit: Payer: Self-pay

## 2018-11-01 ENCOUNTER — Ambulatory Visit (INDEPENDENT_AMBULATORY_CARE_PROVIDER_SITE_OTHER): Payer: Medicare Other | Admitting: General Practice

## 2018-11-01 DIAGNOSIS — Z7901 Long term (current) use of anticoagulants: Secondary | ICD-10-CM

## 2018-11-01 DIAGNOSIS — I2699 Other pulmonary embolism without acute cor pulmonale: Secondary | ICD-10-CM

## 2018-11-01 LAB — POCT INR: INR: 1.9 — AB (ref 2.0–3.0)

## 2018-11-01 NOTE — Patient Instructions (Addendum)
Pre visit review using our clinic review tool, if applicable. No additional management support is needed unless otherwise documented below in the visit note.  Take 1 1/2 tablets today and then continue to take 1 tablet daily.  Re-check in 4 weeks. 

## 2018-11-14 ENCOUNTER — Other Ambulatory Visit: Payer: Self-pay | Admitting: Internal Medicine

## 2018-11-16 ENCOUNTER — Other Ambulatory Visit: Payer: Self-pay | Admitting: Internal Medicine

## 2018-11-25 ENCOUNTER — Ambulatory Visit (INDEPENDENT_AMBULATORY_CARE_PROVIDER_SITE_OTHER): Payer: Medicare Other | Admitting: Pulmonary Disease

## 2018-11-25 ENCOUNTER — Ambulatory Visit: Payer: Medicare Other | Admitting: Emergency Medicine

## 2018-11-25 ENCOUNTER — Encounter: Payer: Self-pay | Admitting: Pulmonary Disease

## 2018-11-25 ENCOUNTER — Other Ambulatory Visit: Payer: Self-pay

## 2018-11-25 VITALS — BP 106/60 | HR 52 | Temp 98.0°F | Ht 72.0 in | Wt 171.2 lb

## 2018-11-25 DIAGNOSIS — Z0000 Encounter for general adult medical examination without abnormal findings: Secondary | ICD-10-CM

## 2018-11-25 DIAGNOSIS — Z7901 Long term (current) use of anticoagulants: Secondary | ICD-10-CM

## 2018-11-25 DIAGNOSIS — Z86718 Personal history of other venous thrombosis and embolism: Secondary | ICD-10-CM | POA: Diagnosis not present

## 2018-11-25 DIAGNOSIS — J301 Allergic rhinitis due to pollen: Secondary | ICD-10-CM

## 2018-11-25 DIAGNOSIS — Z86711 Personal history of pulmonary embolism: Secondary | ICD-10-CM | POA: Diagnosis not present

## 2018-11-25 DIAGNOSIS — J439 Emphysema, unspecified: Secondary | ICD-10-CM

## 2018-11-25 MED ORDER — ALBUTEROL SULFATE HFA 108 (90 BASE) MCG/ACT IN AERS: 2.0000 | INHALATION_SPRAY | Freq: Four times a day (QID) | RESPIRATORY_TRACT | 5 refills | Status: DC | PRN

## 2018-11-25 NOTE — Progress Notes (Signed)
@Patient  ID: Charles [name], male    DOB: 27-Jun-1950, 68 y.o.   MRN: [id number]  Chief Complaint  Patient presents with   Follow-up    68yr f/u for PE and COPD    Referring provider: Biagio Borg, MD  HPI:  68 year old male former smoker followed in our office for COPD, history of pulmonary embolism with IVC CF, left lower lobe spiculated nodule that was biopsy negative and subsequently stable on follow-up CT chest in September/2018.  History of spontaneous right-sided pneumothorax that required a VATS.   He is managed on Coumadin.  PMH: Hyperlipidemia, hypertension, GERD BPH, anemia, chronic kidney disease, history of PE, history of DVT Smoker/ Smoking History: Former smoker.  Quit smoking 1990.  15-pack-year smoking history. Maintenance: None Pt of: Dr. Lamonte Sakai  11/25/2018  - Visit   68 year old male former smoker presenting to our office today for one-year follow-up of COPD as well as a history of DVT, PE, pneumothorax.  Overall patient reports he has been doing quite well since last office visit.  He reports that he never received the albuterol rescue inhaler that was discussed and prescribed at last office visit.  He continues to be maintained on Coumadin.  He continues to lead a fairly sedentary lifestyle.  He admits that he is not very active.  Overall, patient feels stable with no acute complaints.  He has a follow-up with primary care in November/2020  We offered the flu vaccine as well as the Pneumovax 23 today patient declined.   Questionaires / Pulmonary Flowsheets:   MMRC: mMRC Dyspnea Scale mMRC Score  11/25/2018 1    Tests:   07/16/2018-CBC with differential- eosinophils relative 1.5, eosinophils absolute 0.1  10/24/2016-CT chest without contrast- postsurgical changes are noted medially in the right upper and lower lobes, consistent with recent bleb stapling, some bulla formation, remains in both upper lobes, density noted in prior exam anteriorly in left upper  lobe appears to have been resolved, stable bibasilar scarring is noted  06/14/2017-echocardiogram-LV ejection fraction 55 to 60%, mild LVH, trileaflet aortic valve, mildly thickened, mildly calcified leaflets, left atrium is mildly dilated  02/25/2016-pulmonary function test- FVC 4.55 (93% predicted), postbronchodilator ratio of 73, postbronchodilator FEV1 3.32 (87% predicted), no bronchodilator response, mid flow reversibility, DLCO 21.25 (52% predicted)  FENO:  No results found for: NITRICOXIDE  PFT: PFT Results Latest Ref Rng & Units 02/25/2016  FVC-Pre L 4.55  FVC-Predicted Pre % 93  FVC-Post L 4.53  FVC-Predicted Post % 92  Pre FEV1/FVC % % 69  Post FEV1/FCV % % 73  FEV1-Pre L 3.16  FEV1-Predicted Pre % 83  FEV1-Post L 3.32  DLCO UNC% % 52  DLCO COR %Predicted % 69  TLC L 7.85  TLC % Predicted % 95  RV % Predicted % 98    WALK:  No flowsheet data found.  Imaging: No results found.  Lab Results:  CBC    Component Value Date/Time   WBC 8.2 07/16/2018 0933   RBC 4.27 07/16/2018 0933   HGB 14.2 07/16/2018 0933   HCT 41.2 07/16/2018 0933   PLT 164.0 07/16/2018 0933   MCV 96.6 07/16/2018 0933   MCH 30.6 06/28/2017 0328   MCHC 34.5 07/16/2018 0933   RDW 13.7 07/16/2018 0933   LYMPHSABS 2.1 07/16/2018 0933   MONOABS 1.2 (H) 07/16/2018 0933   EOSABS 0.1 07/16/2018 0933   BASOSABS 0.1 07/16/2018 0933    BMET    Component Value Date/Time   NA 138 07/16/2018 0933  K 4.2 07/16/2018 0933   CL 106 07/16/2018 0933   CO2 24 07/16/2018 0933   GLUCOSE 72 07/16/2018 0933   BUN 28 (H) 07/16/2018 0933   CREATININE 2.28 (H) 07/16/2018 0933   CALCIUM 8.7 07/16/2018 0933   GFRNONAA 8 (L) 06/29/2017 0353   GFRAA 9 (L) 06/29/2017 0353    BNP    Component Value Date/Time   BNP 165.6 (H) 07/24/2016 0326    ProBNP No results found for: PROBNP  Specialty Problems      Pulmonary Problems   Solitary pulmonary nodule    Qualifier: Diagnosis of  By: Elsworth Soho MD, Leanna Sato         COPD (chronic obstructive pulmonary disease) (HCC)    Qualifier: Diagnosis of  By: Elsworth Soho MD, Leanna Sato.        Allergic rhinitis   Cough   Multiple nodules of lung   Lung nodule   Pneumothorax on right   Lung blebs ([place])      No Known Allergies  Immunization History  Administered Date(s) Administered   Td 02/20/1998, 11/02/2008    Past Medical History:  Diagnosis Date   ABNORMAL ELECTROCARDIOGRAM 11/02/2008   ABSCESS, LUNG 10/23/2006   Anemia 05/26/2014   BACTERIAL PNEUMONIA 12/28/2009   BPH (benign prostatic hyperplasia) 11/22/2010   Cerebellar stroke ([place]) 05/26/2014   CHEST PAIN 12/24/2009   Coronary artery calcification seen on CT scan 06/01/2015   Cramp of limb 07/19/2007   DIVERTICULOSIS, COLON 03/07/2007   DVT (deep venous thrombosis) ([place]) 01/2014   LLE   Emphysema of lung ([place])    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/23/2006   FLANK PAIN, LEFT 02/25/2010   FREQUENCY, URINARY 12/24/2009   HEMORRHOIDS 10/23/2006   HYPERLIPIDEMIA 10/23/2006   HYPERTENSION 10/23/2006   PE (pulmonary embolism) 02/2014   PLANTAR FASCIITIS, LEFT 11/02/2008   Pneumonia 12/2013 X 2   PSA, INCREASED 11/20/2007   PULMONARY NODULE 05/14/2008   Unspecified Peripheral Vascular Disease 10/23/2006    Tobacco History: Social History   Tobacco Use  Smoking Status Former Smoker   Packs/day: 1.00   Years: 15.00   Pack years: 15.00   Types: Cigarettes  Smokeless Tobacco Never Used  Tobacco Comment   "quit smoking cigarettes in the 1990's"   Counseling given: Yes Comment: "quit smoking cigarettes in the 1990's"   Continue to not smoke  Outpatient Encounter Medications as of 11/25/2018  Medication Sig   amLODipine (NORVASC) 10 MG tablet Take 1 tablet by mouth once daily   ezetimibe (ZETIA) 10 MG tablet Take 1 tablet (10 mg total) by mouth daily.   polyethylene glycol-electrolytes (NULYTELY/GOLYTELY) 420 g solution Take 4,000 mLs by mouth as directed.   rosuvastatin (CRESTOR)  40 MG tablet Take 1 tablet by mouth once daily   tadalafil (ADCIRCA/CIALIS) 20 MG tablet TAKE 1 TABLET BY MOUTH ONCE DAILY AS NEEDED FOR  ERECTILE  DYSFUNCTION   temazepam (RESTORIL) 15 MG capsule TAKE 1 TO 2 CAPSULES BY MOUTH AT BEDTIME AS NEEDED FOR SLEEP   tiZANidine (ZANAFLEX) 2 MG tablet Take 1 tablet (2 mg total) by mouth 2 (two) times daily as needed for muscle spasms.   warfarin (COUMADIN) 5 MG tablet TAKE 1 TABLET BY MOUTH ONCE DAILY EXCEPT  2  TABS  ON  SUNDAYS,  TUESDAYS,  AND  THURSDAYS OR AS  DIRECTED BY ANTICOAGULATION CLINIC  90 day   albuterol (VENTOLIN HFA) 108 (90 Base) MCG/ACT inhaler Inhale 2 puffs into the lungs every 6 (six) hours as needed for wheezing  or shortness of breath.   [DISCONTINUED] Diclofenac Sodium (PENNSAID) 2 % SOLN Place 1 application onto the skin 2 (two) times daily. (Patient not taking: Reported on 10/21/2018)   [DISCONTINUED] enoxaparin (LOVENOX) 120 MG/0.8ML injection Inject 0.8 mLs (120 mg total) into the skin daily.   [DISCONTINUED] predniSONE (DELTASONE) 5 MG tablet Take 6 pills for first day, 5 pills second day, 4 pills third day, 3 pills fourth day, 2 pills the fifth day, and 1 pill sixth day.   [DISCONTINUED] Vitamin D, Ergocalciferol, (DRISDOL) 1.25 MG (50000 UT) CAPS capsule Take 1 capsule (50,000 Units total) by mouth every 7 (seven) days.   No facility-administered encounter medications on file as of 11/25/2018.      Review of Systems  Review of Systems  Constitutional: Negative for activity change, chills, fatigue, fever and unexpected weight change.  HENT: Negative for postnasal drip, rhinorrhea, sinus pressure, sinus pain and sore throat.   Eyes: Negative.   Respiratory: Negative for cough, shortness of breath and wheezing.   Cardiovascular: Negative for chest pain and palpitations.  Gastrointestinal: Negative for constipation, diarrhea, nausea and vomiting.  Endocrine: Negative.   Genitourinary: Negative.   Musculoskeletal:  Negative.   Skin: Negative.   Neurological: Negative for dizziness and headaches.  Psychiatric/Behavioral: Negative.  Negative for dysphoric mood. The patient is not nervous/anxious.   All other systems reviewed and are negative.    Physical Exam  BP 106/60    Pulse (!) 52    Temp 98 F (36.7 C) (Temporal)    Ht 6' (1.829 m)    Wt 171 lb 3.2 oz (77.7 kg)    SpO2 98%    BMI 23.22 kg/m   Wt Readings from Last 5 Encounters:  11/25/18 171 lb 3.2 oz (77.7 kg)  10/21/18 180 lb (81.6 kg)  09/20/18 180 lb (81.6 kg)  07/16/18 180 lb (81.6 kg)  02/01/18 207 lb (93.9 kg)    BMI Readings from Last 5 Encounters:  11/25/18 23.22 kg/m  10/21/18 24.41 kg/m  09/20/18 24.41 kg/m  07/16/18 21.91 kg/m  02/01/18 25.20 kg/m     Physical Exam Vitals signs and nursing note reviewed.  Constitutional:      General: He is not in acute distress.    Appearance: Normal appearance. He is normal weight.  HENT:     Head: Normocephalic and atraumatic.     Right Ear: Hearing, tympanic membrane, ear canal and external ear normal.     Left Ear: Hearing, tympanic membrane, ear canal and external ear normal.     Nose: Nose normal. No mucosal edema or rhinorrhea.     Right Turbinates: Not enlarged.     Left Turbinates: Not enlarged.     Mouth/Throat:     Mouth: Mucous membranes are dry.     Pharynx: Oropharynx is clear. No oropharyngeal exudate.  Eyes:     Pupils: Pupils are equal, round, and reactive to light.  Neck:     Musculoskeletal: Normal range of motion.  Cardiovascular:     Rate and Rhythm: Normal rate and regular rhythm.     Pulses: Normal pulses.     Heart sounds: Normal heart sounds. No murmur.  Pulmonary:     Effort: Pulmonary effort is normal.     Breath sounds: Normal breath sounds. No decreased breath sounds, wheezing or rales.  Musculoskeletal:     Right lower leg: No edema.     Left lower leg: No edema.  Lymphadenopathy:     Cervical: No cervical  adenopathy.  Skin:     General: Skin is warm and dry.     Capillary Refill: Capillary refill takes less than 2 seconds.     Findings: No erythema or rash.  Neurological:     General: No focal deficit present.     Mental Status: He is alert and oriented to person, place, and time.     Motor: No weakness.     Coordination: Coordination normal.     Gait: Gait is intact. Gait normal.  Psychiatric:        Mood and Affect: Mood normal.        Behavior: Behavior normal. Behavior is cooperative.        Thought Content: Thought content normal.        Judgment: Judgment normal.       Assessment & Plan:   Allergic rhinitis Clinically stable at this point  Plan: If symptoms worsen can consider starting daily antihistamine  COPD (chronic obstructive pulmonary disease) (HCC) Clinically stable mMRC 1 Patient reports a morning productive cough occasionally  Plan: Albuterol rescue inhaler to be used as needed Contact our office if you have not use the rescue inhaler more often than usual Start increasing daily physical activity Offered flu vaccine as well as Pneumovax 23, patient declined today Follow-up in 1 year  History of DVT (deep vein thrombosis) Plan: Continue Coumadin  Hx of pulmonary embolus Plan: Continue Coumadin  Long term (current) use of anticoagulants Plan: Continue Coumadin  Preventative health care Plan: Offered flu vaccine today, patient declined Offered Pneumovax 23, patient declined  I have encouraged the patient to discuss these vaccinations with primary care in November/2020 office visit    Return in about 1 year (around 11/25/2019), or if symptoms worsen or fail to improve.   Lauraine Rinne, NP 11/25/2018   This appointment was 30 minutes long with over 50% of the time in direct face-to-face patient care, assessment, plan of care, and follow-up.

## 2018-11-25 NOTE — Assessment & Plan Note (Signed)
Clinically stable mMRC 1 Patient reports a morning productive cough occasionally  Plan: Albuterol rescue inhaler to be used as needed Contact our office if you have not use the rescue inhaler more often than usual Start increasing daily physical activity Offered flu vaccine as well as Pneumovax 23, patient declined today Follow-up in 1 year

## 2018-11-25 NOTE — Assessment & Plan Note (Signed)
Plan: Continue Coumadin 

## 2018-11-25 NOTE — Assessment & Plan Note (Signed)
Plan: Offered flu vaccine today, patient declined Offered Pneumovax 23, patient declined  I have encouraged the patient to discuss these vaccinations with primary care in November/2020 office visit

## 2018-11-25 NOTE — Assessment & Plan Note (Signed)
Clinically stable at this point  Plan: If symptoms worsen can consider starting daily antihistamine

## 2018-11-25 NOTE — Patient Instructions (Addendum)
You were seen today by Lauraine Rinne, NP  for:   1. Pulmonary emphysema, unspecified emphysema type ([place])  Only use your albuterol as a rescue medication to be used if you can't catch your breath by resting or doing a relaxed purse lip breathing pattern.  - The less you use it, the better it will work when you need it. - Ok to use up to 2 puffs  every 4 hours if you must but call for immediate appointment if use goes up over your usual need - Don't leave home without it !!  (think of it like the spare tire for your car)   Note your daily symptoms > remember "red flags" for COPD:   >>>Increase in cough >>>increase in sputum production >>>increase in shortness of breath or activity  intolerance.   If you notice these symptoms, please call the office to be seen.   We recommend that you receive your flu vaccine as well as your pneumonia vaccine, you declined these today.  Please discuss these with primary care at your follow-up visit in November/2020  Please review the information listed below as well is work on increasing your daily physical activity  2. Hx of pulmonary embolus  Continue your daily Coumadin as prescribed  3. History of DVT (deep vein thrombosis)  Continue Coumadin as prescribed  4. Long term (current) use of anticoagulants  Continue Coumadin  Continue follow-up with Coumadin clinic or primary care  5. Preventative health care  We recommend the flu vaccine and pneumonia vaccine.  Please discuss Pneumovax 23 with primary care in November  6. Allergic rhinitis due to pollen, unspecified seasonality  If you start to have worsened congestion or nasal congestion please consider the following:  Please start taking a daily antihistamine:  >>>choose one of: zyrtec, claritin, allegra, or xyzal  >>>these are over the counter medications  >>>can choose generic option  >>>take daily  >>>this medication helps with allergies, post nasal drip, and cough     Follow Up:     Return in about 1 year (around 11/25/2019), or if symptoms worsen or fail to improve.   Please do your part to reduce the spread of COVID-19:      Reduce your risk of any infection  and COVID19 by using the similar precautions used for avoiding the common cold or flu:  Marland Kitchen Wash your hands often with soap and warm water for at least 20 seconds.  If soap and water are not readily available, use an alcohol-based hand sanitizer with at least 60% alcohol.  . If coughing or sneezing, cover your mouth and nose by coughing or sneezing into the elbow areas of your shirt or coat, into a tissue or into your sleeve (not your hands). Langley Gauss A MASK when in public  . Avoid shaking hands with others and consider head nods or verbal greetings only. . Avoid touching your eyes, nose, or mouth with unwashed hands.  . Avoid close contact with people who are sick. . Avoid places or events with large numbers of people in one location, like concerts or sporting events. . If you have some symptoms but not all symptoms, continue to monitor at home and seek medical attention if your symptoms worsen. . If you are having a medical emergency, call 911.   [place] / e-Visit: [email or web address]         [facility]: [facility]: [phone]  [facility]: [phone]     It is flu season:   >>> Best ways to protect herself from the flu: Receive the yearly flu vaccine, practice good hand hygiene washing with soap and also using hand sanitizer when available, eat a nutritious meals, get adequate rest, hydrate appropriately   Please contact the office if your symptoms worsen or you have concerns that you are not improving.   Thank you for choosing [facility] for your healthcare, and for allowing [place] to partner with you on your  healthcare journey. I am thankful to be able to provide care to you today.   Wyn Quaker FNP-C     COPD and Physical Activity Chronic obstructive pulmonary disease (COPD) is a long-term (chronic) condition that affects the lungs. COPD is a general term that can be used to describe many different lung problems that cause lung swelling (inflammation) and limit airflow, including chronic bronchitis and emphysema. The main symptom of COPD is shortness of breath, which makes it harder to do even simple tasks. This can also make it harder to exercise and be active. Talk with your health care provider about treatments to help you breathe better and actions you can take to prevent breathing problems during physical activity. What are the benefits of exercising with COPD? Exercising regularly is an important part of a healthy lifestyle. You can still exercise and do physical activities even though you have COPD. Exercise and physical activity improve your shortness of breath by increasing blood flow (circulation). This causes your heart to pump more oxygen through your body. Moderate exercise can improve your:  Oxygen use.  Energy level.  Shortness of breath.  Strength in your breathing muscles.  Heart health.  Sleep.  Self-esteem and feelings of self-worth.  Depression, stress, and anxiety levels. Exercise can benefit everyone with COPD. The severity of your disease may affect how hard you can exercise, especially at first, but everyone can benefit. Talk with your health care provider about how much exercise is safe for you, and which activities and exercises are safe for you. What actions can I take to prevent breathing problems during physical activity?  Sign up for a pulmonary rehabilitation program. This type of program may include: ? Education about lung diseases. ? Exercise classes that teach you how to exercise and be more active while improving your breathing. This usually involves:   Exercise using your lower extremities, such as a stationary bicycle.  About 30 minutes of exercise, 2 to 5 times per week, for 6 to 12 weeks  Strength training, such as push ups or leg lifts. ? Nutrition education. ? Group classes in which you can talk with others who also have COPD and learn ways to manage stress.  If you use an oxygen tank, you should use it while you exercise. Work with your health care provider to adjust your oxygen for your physical activity. Your resting flow rate is different from your flow rate during physical activity.  While you are exercising: ? Take slow breaths. ? Pace yourself and do not try to go too fast. ? Purse your lips while breathing out. Pursing your lips is similar to a kissing or whistling position. ? If doing exercise that uses a quick burst of effort, such as weight lifting:  Breathe in before starting the exercise.  Breathe out during the hardest part of the exercise (such as raising the weights). Where to find support You can find support for exercising with COPD  from:  Your health care provider.  A pulmonary rehabilitation program.  Your local health department or community health programs.  Support groups, online or in-person. Your health care provider may be able to recommend support groups. Where to find more information You can find more information about exercising with COPD from:  American Lung Association: ClassInsider.se.  COPD Foundation: https://www.rivera.net/. Contact a health care provider if:  Your symptoms get worse.  You have chest pain.  You have nausea.  You have a fever.  You have trouble talking or catching your breath.  You want to start a new exercise program or a new activity. Summary  COPD is a general term that can be used to describe many different lung problems that cause lung swelling (inflammation) and limit airflow. This includes chronic bronchitis and emphysema.  Exercise and physical activity  improve your shortness of breath by increasing blood flow (circulation). This causes your heart to provide more oxygen to your body.  Contact your health care provider before starting any exercise program or new activity. Ask your health care provider what exercises and activities are safe for you. This information is not intended to replace advice given to you by your health care provider. Make sure you discuss any questions you have with your health care provider. Document Released: 03/01/2017 Document Revised: 05/29/2018 Document Reviewed: 03/01/2017 Elsevier Patient Education  2020 [employer].

## 2018-11-29 ENCOUNTER — Other Ambulatory Visit: Payer: Self-pay

## 2018-11-29 ENCOUNTER — Ambulatory Visit (INDEPENDENT_AMBULATORY_CARE_PROVIDER_SITE_OTHER): Payer: Medicare Other | Admitting: General Practice

## 2018-11-29 DIAGNOSIS — Z7901 Long term (current) use of anticoagulants: Secondary | ICD-10-CM

## 2018-11-29 DIAGNOSIS — I2699 Other pulmonary embolism without acute cor pulmonale: Secondary | ICD-10-CM

## 2018-11-29 LAB — POCT INR: INR: 3.9 — AB (ref 2.0–3.0)

## 2018-11-29 NOTE — Patient Instructions (Addendum)
Pre visit review using our clinic review tool, if applicable. No additional management support is needed unless otherwise documented below in the visit note.  Skip coumadin today (10/9) and take 1/2 tablet tomorrow (10/10) and then continue to take 1 tablet daily.  Re-check in 3 weeks.

## 2018-12-01 NOTE — Progress Notes (Signed)
Agree with management.  Monnie Gudgel J Almond Fitzgibbon, MD  

## 2018-12-10 ENCOUNTER — Encounter: Payer: Self-pay | Admitting: *Deleted

## 2018-12-11 ENCOUNTER — Ambulatory Visit (INDEPENDENT_AMBULATORY_CARE_PROVIDER_SITE_OTHER): Payer: Medicare Other | Admitting: Internal Medicine

## 2018-12-11 ENCOUNTER — Encounter: Payer: Self-pay | Admitting: Internal Medicine

## 2018-12-11 ENCOUNTER — Other Ambulatory Visit (INDEPENDENT_AMBULATORY_CARE_PROVIDER_SITE_OTHER): Payer: Medicare Other

## 2018-12-11 ENCOUNTER — Other Ambulatory Visit: Payer: Self-pay

## 2018-12-11 VITALS — BP 116/80 | HR 51 | Temp 98.6°F | Ht 72.0 in | Wt 166.0 lb

## 2018-12-11 DIAGNOSIS — N183 Chronic kidney disease, stage 3 unspecified: Secondary | ICD-10-CM

## 2018-12-11 DIAGNOSIS — R972 Elevated prostate specific antigen [PSA]: Secondary | ICD-10-CM

## 2018-12-11 DIAGNOSIS — I10 Essential (primary) hypertension: Secondary | ICD-10-CM | POA: Diagnosis not present

## 2018-12-11 DIAGNOSIS — R739 Hyperglycemia, unspecified: Secondary | ICD-10-CM

## 2018-12-11 DIAGNOSIS — T07XXXA Unspecified multiple injuries, initial encounter: Secondary | ICD-10-CM | POA: Insufficient documentation

## 2018-12-11 DIAGNOSIS — E559 Vitamin D deficiency, unspecified: Secondary | ICD-10-CM

## 2018-12-11 LAB — CBC WITH DIFFERENTIAL/PLATELET
Basophils Absolute: 0.1 10*3/uL (ref 0.0–0.1)
Basophils Relative: 0.8 % (ref 0.0–3.0)
Eosinophils Absolute: 0.1 10*3/uL (ref 0.0–0.7)
Eosinophils Relative: 0.6 % (ref 0.0–5.0)
HCT: 39.3 % (ref 39.0–52.0)
Hemoglobin: 13.3 g/dL (ref 13.0–17.0)
Lymphocytes Relative: 26 % (ref 12.0–46.0)
Lymphs Abs: 2.2 10*3/uL (ref 0.7–4.0)
MCHC: 33.9 g/dL (ref 30.0–36.0)
MCV: 103 fl — ABNORMAL HIGH (ref 78.0–100.0)
Monocytes Absolute: 1.2 10*3/uL — ABNORMAL HIGH (ref 0.1–1.0)
Monocytes Relative: 14.3 % — ABNORMAL HIGH (ref 3.0–12.0)
Neutro Abs: 5 10*3/uL (ref 1.4–7.7)
Neutrophils Relative %: 58.3 % (ref 43.0–77.0)
Platelets: 167 10*3/uL (ref 150.0–400.0)
RBC: 3.82 Mil/uL — ABNORMAL LOW (ref 4.22–5.81)
RDW: 13.5 % (ref 11.5–15.5)
WBC: 8.5 10*3/uL (ref 4.0–10.5)

## 2018-12-11 LAB — HEPATIC FUNCTION PANEL
ALT: 53 U/L (ref 0–53)
AST: 86 U/L — ABNORMAL HIGH (ref 0–37)
Albumin: 3.8 g/dL (ref 3.5–5.2)
Alkaline Phosphatase: 35 U/L — ABNORMAL LOW (ref 39–117)
Bilirubin, Direct: 0.2 mg/dL (ref 0.0–0.3)
Total Bilirubin: 0.8 mg/dL (ref 0.2–1.2)
Total Protein: 6.5 g/dL (ref 6.0–8.3)

## 2018-12-11 LAB — BASIC METABOLIC PANEL
BUN: 25 mg/dL — ABNORMAL HIGH (ref 6–23)
CO2: 25 mEq/L (ref 19–32)
Calcium: 8.6 mg/dL (ref 8.4–10.5)
Chloride: 106 mEq/L (ref 96–112)
Creatinine, Ser: 2.03 mg/dL — ABNORMAL HIGH (ref 0.40–1.50)
GFR: 39.92 mL/min — ABNORMAL LOW (ref >60.00)
Glucose, Bld: 97 mg/dL (ref 70–99)
Potassium: 4.7 mEq/L (ref 3.5–5.1)
Sodium: 138 mEq/L (ref 135–145)

## 2018-12-11 LAB — VITAMIN D 25 HYDROXY (VIT D DEFICIENCY, FRACTURES): VITD: 59.09 ng/mL (ref 30.00–100.00)

## 2018-12-11 LAB — LIPID PANEL
Cholesterol: 205 mg/dL — ABNORMAL HIGH (ref 0–200)
HDL: 89.4 mg/dL (ref >39.00)
LDL Cholesterol: 89 mg/dL (ref 0–99)
NonHDL: 115.59
Total CHOL/HDL Ratio: 2
Triglycerides: 133 mg/dL (ref 0.0–149.0)
VLDL: 26.6 mg/dL (ref 0.0–40.0)

## 2018-12-11 LAB — HEMOGLOBIN A1C: Hgb A1c MFr Bld: 4.2 % — ABNORMAL LOW (ref 4.6–6.5)

## 2018-12-11 LAB — PSA: PSA: 2.82 ng/mL (ref 0.10–4.00)

## 2018-12-11 NOTE — Progress Notes (Signed)
Subjective:    Patient ID: Charles [name], male    DOB: Nov 29, 1950, 68 y.o.   MRN: [id number]  HPI Here to f/u; overall doing ok,  Pt denies chest pain, increasing sob or doe, wheezing, orthopnea, PND, increased LE swelling, palpitations, dizziness or syncope.  Pt denies new neurological symptoms such as new headache, or facial or extremity weakness or numbness.  Pt denies polydipsia, polyuria, or low sugar episode.  Pt states overall good compliance with meds, mostly trying to follow appropriate diet, with wt overall stable,  No new complaints  Did finish his high dose vit d,   Past Medical History:  Diagnosis Date  . ABNORMAL ELECTROCARDIOGRAM 11/02/2008  . ABSCESS, LUNG 10/23/2006  . Anemia 05/26/2014  . BACTERIAL PNEUMONIA 12/28/2009  . BPH (benign prostatic hyperplasia) 11/22/2010  . 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 CHEST PAIN 12/24/2009  . Coronary artery calcification seen on CT scan 06/01/2015  . Cramp of limb 07/19/2007  . DIVERTICULOSIS, COLON 03/07/2007  . DVT (deep venous thrombosis) ([place]) 01/2014   LLE  . Emphysema of lung ([place])   . 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/23/2006  . FLANK PAIN, LEFT 02/25/2010  . FREQUENCY, URINARY 12/24/2009  . HEMORRHOIDS 10/23/2006  . HYPERLIPIDEMIA 10/23/2006  . HYPERTENSION 10/23/2006  . PE (pulmonary embolism) 02/2014  . PLANTAR FASCIITIS, LEFT 11/02/2008  . Pneumonia 12/2013 X 2  . PSA, INCREASED 11/20/2007  . PULMONARY NODULE 05/14/2008  . Unspecified Peripheral Vascular Disease 10/23/2006   Past Surgical History:  Procedure Laterality Date  . ESOPHAGOGASTRODUODENOSCOPY (EGD) WITH PROPOFOL N/A 06/15/2017   Procedure: ESOPHAGOGASTRODUODENOSCOPY (EGD) WITH PROPOFOL;  Surgeon: Jerene Bears, MD;  Location: [facility];  Service: Gastroenterology;  Laterality: N/A;  . HEMORRHOID SURGERY  ?1990's  . INGUINAL HERNIA REPAIR Bilateral ?2000's  . IVC FILTER INSERTION N/A 10/03/2016   Procedure: IVC Filter Retrieveal;  Surgeon: Serafina Mitchell, MD;  Location: [facility];  Service: Cardiovascular;  Laterality: N/A;  . SHOULDER ARTHROSCOPY W/ ROTATOR CUFF REPAIR Right 11/2013  . STAPLING OF BLEBS Right 07/31/2016   Procedure: BLEBECTOMY;  Surgeon: Melrose Nakayama, MD;  Location: [place] [facility];  Service: Thoracic;  Laterality: Right;  . VENA CAVA FILTER PLACEMENT Right 08/04/2016   Procedure: INSERTION VENA-CAVA FILTER;  Surgeon: Melrose Nakayama, MD;  Location: [facility];  Service: Thoracic;  Laterality: Right;  Marland Kitchen VIDEO ASSISTED THORACOSCOPY Right 07/31/2016   Procedure: VIDEO ASSISTED THORACOSCOPY;  Surgeon: Melrose Nakayama, MD;  Location: [facility];  Service: Thoracic;  Laterality: Right;  Marland Kitchen VIDEO ASSISTED THORACOSCOPY Right 08/04/2016   Procedure: REDO VIDEO ASSISTED THORACOSCOPY- EVACUATION OF HEMOTHORAX;  Surgeon: Melrose Nakayama, MD;  Location: [facility];  Service: Thoracic;  Laterality: Right;  Marland Kitchen VIDEO BRONCHOSCOPY WITH ENDOBRONCHIAL NAVIGATION N/A 03/11/2014   Procedure: VIDEO BRONCHOSCOPY WITH ENDOBRONCHIAL NAVIGATION;  Surgeon: Collene Gobble, MD;  Location: [facility];  Service: Thoracic;  Laterality: N/A;    reports that he has quit smoking. His smoking use included cigarettes. He has a 15.00 pack-year smoking history. He has never used smokeless tobacco. He reports current alcohol use. He reports current drug use. Drug: Marijuana. family history includes Heart disease in his father and mother; Kidney disease in his sister. No Known Allergies Current Outpatient Medications on File Prior to Visit  Medication Sig Dispense Refill  . albuterol (VENTOLIN HFA) 108 (90 Base) MCG/ACT inhaler Inhale 2 puffs into the lungs every 6 (six) hours as needed for wheezing or shortness of breath. 8 g 5  .  amLODipine (NORVASC) 10 MG tablet Take 1 tablet by mouth once daily 90 tablet 0  . ezetimibe (ZETIA) 10 MG tablet Take 1 tablet (10 mg total) by mouth daily. 90 tablet 3  . polyethylene glycol-electrolytes (NULYTELY/GOLYTELY) 420 g solution Take 4,000 mLs by mouth as  directed. 4000 mL 0  . rosuvastatin (CRESTOR) 40 MG tablet Take 1 tablet by mouth once daily 90 tablet 0  . tadalafil (ADCIRCA/CIALIS) 20 MG tablet TAKE 1 TABLET BY MOUTH ONCE DAILY AS NEEDED FOR  ERECTILE  DYSFUNCTION 10 tablet 11  . temazepam (RESTORIL) 15 MG capsule TAKE 1 TO 2 CAPSULES BY MOUTH AT BEDTIME AS NEEDED FOR SLEEP 60 capsule 5  . tiZANidine (ZANAFLEX) 2 MG tablet Take 1 tablet (2 mg total) by mouth 2 (two) times daily as needed for muscle spasms. 60 tablet 1  . warfarin (COUMADIN) 5 MG tablet TAKE 1 TABLET BY MOUTH ONCE DAILY EXCEPT  2  TABS  ON  SUNDAYS,  TUESDAYS,  AND  THURSDAYS OR AS  DIRECTED BY ANTICOAGULATION CLINIC  90 day 130 tablet 1   No current facility-administered medications on file prior to visit.    Review of Systems  Constitutional: Negative for other unusual diaphoresis or sweats HENT: Negative for ear discharge or swelling Eyes: Negative for other worsening visual disturbances Respiratory: Negative for stridor or other swelling  Gastrointestinal: Negative for worsening distension or other blood Genitourinary: Negative for retention or other urinary change Musculoskeletal: Negative for other MSK pain or swelling Skin: Negative for color change or other new lesions Neurological: Negative for worsening tremors and other numbness  Psychiatric/Behavioral: Negative for worsening agitation or other fatigue All otherwise neg per pt    Objective:   Physical Exam BP 116/80 (BP Location: Left Arm, Patient Position: Sitting, Cuff Size: Normal)   Pulse (!) 51   Temp 98.6 F (37 C) (Oral)   Ht 6' (1.829 m)   Wt 166 lb (75.3 kg)   SpO2 98%   BMI 22.51 kg/m  VS noted,  Constitutional: Pt appears in NAD HENT: Head: NCAT.  Right Ear: External ear normal.  Left Ear: External ear normal.  Eyes: . Pupils are equal, round, and reactive to light. Conjunctivae and EOM are normal Nose: without d/c or deformity Neck: Neck supple. Gross normal ROM Cardiovascular:  Normal rate and regular rhythm.   Pulmonary/Chest: Effort normal and breath sounds without rales or wheezing.  Abd:  Soft, NT, ND, + BS, no organomegaly Neurological: Pt is alert. At baseline orientation, motor grossly intact Skin: Skin is warm. No rashes, other new lesions, no LE edema Psychiatric: Pt behavior is normal without agitation  All otherwise neg per pt  Lab Results  Component Value Date   WBC 8.5 12/11/2018   HGB 13.3 12/11/2018   HCT 39.3 12/11/2018   PLT 167.0 12/11/2018   GLUCOSE 97 12/11/2018   CHOL 205 (H) 12/11/2018   TRIG 133.0 12/11/2018   HDL 89.40 12/11/2018   LDLDIRECT 81.0 07/16/2018   LDLCALC 89 12/11/2018   ALT 53 12/11/2018   AST 86 (H) 12/11/2018   NA 138 12/11/2018   K 4.7 12/11/2018   CL 106 12/11/2018   CREATININE 2.03 (H) 12/11/2018   BUN 25 (H) 12/11/2018   CO2 25 12/11/2018   TSH 3.58 07/16/2018   PSA 2.82 12/11/2018   INR 3.9 (A) 11/29/2018   HGBA1C 4.2 (L) 12/11/2018   MICROALBUR 1.5 08/25/2015       Assessment & Plan:

## 2018-12-11 NOTE — Assessment & Plan Note (Signed)
stable overall by history and exam, recent data reviewed with pt, and pt to continue medical treatment as before,  to f/u any worsening symptoms or concerns  

## 2018-12-11 NOTE — Assessment & Plan Note (Signed)
Also for f/u psa 

## 2018-12-11 NOTE — Patient Instructions (Signed)

## 2018-12-11 NOTE — Assessment & Plan Note (Signed)
For fu/ lab, cont oral otc replacement

## 2018-12-16 ENCOUNTER — Other Ambulatory Visit: Payer: Self-pay | Admitting: Internal Medicine

## 2018-12-16 DIAGNOSIS — Z7901 Long term (current) use of anticoagulants: Secondary | ICD-10-CM

## 2018-12-17 ENCOUNTER — Encounter (INDEPENDENT_AMBULATORY_CARE_PROVIDER_SITE_OTHER): Payer: Self-pay

## 2018-12-20 ENCOUNTER — Ambulatory Visit: Payer: Medicare Other

## 2019-01-10 ENCOUNTER — Other Ambulatory Visit: Payer: Self-pay

## 2019-01-10 ENCOUNTER — Ambulatory Visit (INDEPENDENT_AMBULATORY_CARE_PROVIDER_SITE_OTHER): Payer: Medicare Other | Admitting: General Practice

## 2019-01-10 DIAGNOSIS — Z7901 Long term (current) use of anticoagulants: Secondary | ICD-10-CM

## 2019-01-10 DIAGNOSIS — I2699 Other pulmonary embolism without acute cor pulmonale: Secondary | ICD-10-CM

## 2019-01-10 LAB — POCT INR: INR: 1.9 — AB (ref 2.0–3.0)

## 2019-01-10 NOTE — Progress Notes (Signed)
Medical screening examination/treatment/procedure(s) were performed by non-physician practitioner and as supervising physician I was immediately available for consultation/collaboration. I agree with above. James John, MD   

## 2019-01-10 NOTE — Patient Instructions (Addendum)
Pre visit review using our clinic review tool, if applicable. No additional management support is needed unless otherwise documented below in the visit note.  Take 1 1/2 tablets today and then continue to take 1 tablet daily.  Re-check in 4 weeks. 

## 2019-01-15 ENCOUNTER — Ambulatory Visit: Payer: Medicare Other | Admitting: Internal Medicine

## 2019-02-07 ENCOUNTER — Ambulatory Visit: Payer: Medicare Other

## 2019-02-15 ENCOUNTER — Other Ambulatory Visit: Payer: Self-pay | Admitting: Internal Medicine

## 2019-02-16 NOTE — Telephone Encounter (Signed)
Per routine office policy  All routine meds ok for 1 yr, then month to month only for 3 mo until refused after, thanks 

## 2019-02-25 ENCOUNTER — Ambulatory Visit (INDEPENDENT_AMBULATORY_CARE_PROVIDER_SITE_OTHER): Payer: Medicare Other | Admitting: General Practice

## 2019-02-25 ENCOUNTER — Other Ambulatory Visit: Payer: Self-pay

## 2019-02-25 DIAGNOSIS — Z7901 Long term (current) use of anticoagulants: Secondary | ICD-10-CM | POA: Diagnosis not present

## 2019-02-25 LAB — POCT INR: INR: 1.3 — AB (ref 2.0–3.0)

## 2019-02-25 NOTE — Patient Instructions (Signed)
.  lbpcmh  Take 1 1/2 tablets today, tomorrow and Thursday and then change dosage and take 1 tablet daily except 1 1/2 tablets on Mondays and Thursdays.  Re-check in 1 week.

## 2019-02-25 NOTE — Progress Notes (Signed)
Medical screening examination/treatment/procedure(s) were performed by non-physician practitioner and as supervising physician I was immediately available for consultation/collaboration. I agree with above. Janiyha Montufar, MD   

## 2019-03-04 ENCOUNTER — Ambulatory Visit (INDEPENDENT_AMBULATORY_CARE_PROVIDER_SITE_OTHER): Payer: Medicare Other | Admitting: General Practice

## 2019-03-04 ENCOUNTER — Other Ambulatory Visit: Payer: Self-pay

## 2019-03-04 DIAGNOSIS — Z7901 Long term (current) use of anticoagulants: Secondary | ICD-10-CM

## 2019-03-04 DIAGNOSIS — I2699 Other pulmonary embolism without acute cor pulmonale: Secondary | ICD-10-CM

## 2019-03-04 LAB — POCT INR: INR: 2 (ref 2.0–3.0)

## 2019-03-04 NOTE — Patient Instructions (Addendum)
.  lbpcmh  Continue to take 1 tablet daily except 1 1/2 tablets on Mondays and Thursdays.  Re-check in 4 weeks.

## 2019-03-04 NOTE — Progress Notes (Signed)
Medical screening examination/treatment/procedure(s) were performed by non-physician practitioner and as supervising physician I was immediately available for consultation/collaboration. I agree with above. Rhodie Cienfuegos, MD   

## 2019-04-01 ENCOUNTER — Ambulatory Visit (INDEPENDENT_AMBULATORY_CARE_PROVIDER_SITE_OTHER): Payer: Medicare Other | Admitting: General Practice

## 2019-04-01 ENCOUNTER — Other Ambulatory Visit: Payer: Self-pay

## 2019-04-01 DIAGNOSIS — Z7901 Long term (current) use of anticoagulants: Secondary | ICD-10-CM

## 2019-04-01 DIAGNOSIS — I2699 Other pulmonary embolism without acute cor pulmonale: Secondary | ICD-10-CM

## 2019-04-01 LAB — POCT INR: INR: 3.1 — AB (ref 2.0–3.0)

## 2019-04-01 NOTE — Patient Instructions (Addendum)
Pre visit review using our clinic review tool, if applicable. No additional management support is needed unless otherwise documented below in the visit note. Take 1/2 tablet tomorrow (2/10) and then continue to take 1 tablet daily except 1 1/2 tablets on Mondays and Thursdays.  Re-check in 4 weeks.

## 2019-04-10 ENCOUNTER — Ambulatory Visit: Payer: Medicare Other

## 2019-04-13 ENCOUNTER — Other Ambulatory Visit: Payer: Self-pay | Admitting: Internal Medicine

## 2019-04-13 NOTE — Telephone Encounter (Signed)
Please refill as per office routine med refill policy (all routine meds refilled for 3 mo or monthly per pt preference up to one year from last visit, then month to month grace period for 3 mo, then further med refills will have to be denied)  

## 2019-04-14 ENCOUNTER — Ambulatory Visit: Payer: Medicare Other | Attending: Family

## 2019-04-14 DIAGNOSIS — Z23 Encounter for immunization: Secondary | ICD-10-CM | POA: Insufficient documentation

## 2019-04-14 NOTE — Progress Notes (Signed)
   Covid-19 Vaccination Clinic  Name:  Charles [name]    MRN: [id number] DOB: 12-01-1950  04/14/2019  Mr. Charles [name] was observed post Covid-19 immunization for 15 minutes without incidence. He was provided with Vaccine Information Sheet and instruction to access the V-Safe system.   Mr. Charles [name] was instructed to call 911 with any severe reactions post vaccine: Marland Kitchen Difficulty breathing  . Swelling of your face and throat  . A fast heartbeat  . A bad rash all over your body  . Dizziness and weakness    Immunizations Administered    Name Date Dose VIS Date Route   Moderna COVID-19 Vaccine 04/14/2019  9:58 AM 0.5 mL 01/21/2019 Intramuscular   Manufacturer: Moderna   Lot: [id number]   [id number]

## 2019-04-29 ENCOUNTER — Ambulatory Visit: Payer: Medicare Other

## 2019-05-08 ENCOUNTER — Ambulatory Visit (INDEPENDENT_AMBULATORY_CARE_PROVIDER_SITE_OTHER): Payer: Medicare Other | Admitting: General Practice

## 2019-05-08 ENCOUNTER — Other Ambulatory Visit: Payer: Self-pay

## 2019-05-08 DIAGNOSIS — Z7901 Long term (current) use of anticoagulants: Secondary | ICD-10-CM | POA: Diagnosis not present

## 2019-05-08 DIAGNOSIS — I2699 Other pulmonary embolism without acute cor pulmonale: Secondary | ICD-10-CM

## 2019-05-08 LAB — POCT INR: INR: 6 — AB (ref 2.0–3.0)

## 2019-05-08 NOTE — Progress Notes (Signed)
Medical screening examination/treatment/procedure(s) were performed by non-physician practitioner and as supervising physician I was immediately available for consultation/collaboration. I agree with above. Kaytie Ratcliffe, MD   

## 2019-05-08 NOTE — Patient Instructions (Addendum)
Pre visit review using our clinic review tool, if applicable. No additional management support is needed unless otherwise documented below in the visit note.  Hold coumadin through Sunday.  On Monday continue to take 1 tablet daily except 1 1/2 tablets on Monday and Thursday.  Re-check in 1 week.  Decrease alcohol consumption.

## 2019-05-13 ENCOUNTER — Ambulatory Visit: Payer: Medicare Other | Attending: Family

## 2019-05-13 DIAGNOSIS — Z23 Encounter for immunization: Secondary | ICD-10-CM

## 2019-05-13 NOTE — Progress Notes (Signed)
   Covid-19 Vaccination Clinic  Name:  Charles [name]    MRN: [id number] DOB: 12-Apr-1950  05/13/2019  Mr. Charles [name] was observed post Covid-19 immunization for 30 minutes based on pre-vaccination screening without incident. He was provided with Vaccine Information Sheet and instruction to access the V-Safe system.   Mr. Charles [name] was instructed to call 911 with any severe reactions post vaccine: Marland Kitchen Difficulty breathing  . Swelling of face and throat  . A fast heartbeat  . A bad rash all over body  . Dizziness and weakness   Immunizations Administered    Name Date Dose VIS Date Route   Moderna COVID-19 Vaccine 05/13/2019 11:06 AM 0.5 mL 01/21/2019 Intramuscular   Manufacturer: Moderna   [id number]   [id number]

## 2019-05-15 ENCOUNTER — Other Ambulatory Visit: Payer: Self-pay

## 2019-05-15 ENCOUNTER — Ambulatory Visit (INDEPENDENT_AMBULATORY_CARE_PROVIDER_SITE_OTHER): Payer: Medicare Other | Admitting: General Practice

## 2019-05-15 DIAGNOSIS — I2699 Other pulmonary embolism without acute cor pulmonale: Secondary | ICD-10-CM

## 2019-05-15 DIAGNOSIS — Z7901 Long term (current) use of anticoagulants: Secondary | ICD-10-CM | POA: Diagnosis not present

## 2019-05-15 LAB — POCT INR: INR: 1.3 — AB (ref 2.0–3.0)

## 2019-05-15 NOTE — Progress Notes (Signed)
Medical screening examination/treatment/procedure(s) were performed by non-physician practitioner and as supervising physician I was immediately available for consultation/collaboration. I agree with above. Siona Coulston, MD   

## 2019-05-15 NOTE — Patient Instructions (Addendum)
Pre visit review using our clinic review tool, if applicable. No additional management support is needed unless otherwise documented below in the visit note.  Take 2 tablets today and take 1 1/2 tablets Friday and Saturday.  On Sunday continue to take 1 tablet daily except 1 1/2 tablets on Monday and Thursday.  Re-check in 7 to 10 days.

## 2019-05-27 ENCOUNTER — Ambulatory Visit: Payer: Medicare Other

## 2019-06-10 ENCOUNTER — Other Ambulatory Visit: Payer: Self-pay

## 2019-06-10 ENCOUNTER — Ambulatory Visit (INDEPENDENT_AMBULATORY_CARE_PROVIDER_SITE_OTHER): Payer: Medicare Other | Admitting: General Practice

## 2019-06-10 DIAGNOSIS — Z7901 Long term (current) use of anticoagulants: Secondary | ICD-10-CM

## 2019-06-10 DIAGNOSIS — I2699 Other pulmonary embolism without acute cor pulmonale: Secondary | ICD-10-CM

## 2019-06-10 LAB — POCT INR: INR: 2.9 (ref 2.0–3.0)

## 2019-06-10 NOTE — Progress Notes (Signed)
Medical screening examination/treatment/procedure(s) were performed by non-physician practitioner and as supervising physician I was immediately available for consultation/collaboration. I agree with above. Sheyenne Konz, MD   

## 2019-06-10 NOTE — Patient Instructions (Addendum)
Pre visit review using our clinic review tool, if applicable. No additional management support is needed unless otherwise documented below in the visit note. Continue to take 1 tablet daily except 1 1/2 tablets on Monday and Thursday.  Re-check in 4 weeks.

## 2019-06-11 ENCOUNTER — Telehealth: Payer: Self-pay | Admitting: Internal Medicine

## 2019-06-11 ENCOUNTER — Other Ambulatory Visit: Payer: Self-pay

## 2019-06-11 DIAGNOSIS — Z7901 Long term (current) use of anticoagulants: Secondary | ICD-10-CM

## 2019-06-11 MED ORDER — WARFARIN SODIUM 5 MG PO TABS: ORAL_TABLET | ORAL | 1 refills | Status: DC

## 2019-06-11 NOTE — Telephone Encounter (Signed)
warfarin (COUMADIN) 5 MG tablet Patient is requesting a refill.  Send to pharmacy on file.

## 2019-06-12 ENCOUNTER — Ambulatory Visit: Payer: Medicare Other | Admitting: Internal Medicine

## 2019-06-12 DIAGNOSIS — Z0289 Encounter for other administrative examinations: Secondary | ICD-10-CM

## 2019-06-18 ENCOUNTER — Encounter (INDEPENDENT_AMBULATORY_CARE_PROVIDER_SITE_OTHER): Payer: Self-pay | Admitting: Otolaryngology

## 2019-06-18 ENCOUNTER — Ambulatory Visit (INDEPENDENT_AMBULATORY_CARE_PROVIDER_SITE_OTHER): Payer: Medicare Other | Admitting: Otolaryngology

## 2019-06-18 ENCOUNTER — Other Ambulatory Visit: Payer: Self-pay

## 2019-06-18 ENCOUNTER — Other Ambulatory Visit: Payer: Self-pay | Admitting: Internal Medicine

## 2019-06-18 VITALS — Temp 97.2°F

## 2019-06-18 DIAGNOSIS — H6981 Other specified disorders of Eustachian tube, right ear: Secondary | ICD-10-CM

## 2019-06-18 DIAGNOSIS — H6121 Impacted cerumen, right ear: Secondary | ICD-10-CM

## 2019-06-18 NOTE — Telephone Encounter (Signed)
Please refill as per office routine med refill policy (all routine meds refilled for 3 mo or monthly per pt preference up to one year from last visit, then month to month grace period for 3 mo, then further med refills will have to be denied)  

## 2019-06-18 NOTE — Progress Notes (Signed)
HPI: Charles [name] is a 69 y.o. male who presents for evaluation of blockage of his right ear.  He is status post placement of a T-tube in the right ear 1 year ago.  He has had no drainage from the ear.  He had had several tubes placed prior to placement of the T-tube.  He feels like he does not hear quite as well from the right ear as he does the left..  Past Medical History:  Diagnosis Date  . ABNORMAL ELECTROCARDIOGRAM 11/02/2008  . ABSCESS, LUNG 10/23/2006  . Anemia 05/26/2014  . BACTERIAL PNEUMONIA 12/28/2009  . BPH (benign prostatic hyperplasia) 11/22/2010  . 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 CHEST PAIN 12/24/2009  . Coronary artery calcification seen on CT scan 06/01/2015  . Cramp of limb 07/19/2007  . DIVERTICULOSIS, COLON 03/07/2007  . DVT (deep venous thrombosis) ([place]) 01/2014   LLE  . Emphysema of lung ([place])   . 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/23/2006  . FLANK PAIN, LEFT 02/25/2010  . FREQUENCY, URINARY 12/24/2009  . HEMORRHOIDS 10/23/2006  . HYPERLIPIDEMIA 10/23/2006  . HYPERTENSION 10/23/2006  . PE (pulmonary embolism) 02/2014  . PLANTAR FASCIITIS, LEFT 11/02/2008  . Pneumonia 12/2013 X 2  . PSA, INCREASED 11/20/2007  . PULMONARY NODULE 05/14/2008  . Unspecified Peripheral Vascular Disease 10/23/2006   Past Surgical History:  Procedure Laterality Date  . ESOPHAGOGASTRODUODENOSCOPY (EGD) WITH PROPOFOL N/A 06/15/2017   Procedure: ESOPHAGOGASTRODUODENOSCOPY (EGD) WITH PROPOFOL;  Surgeon: Jerene Bears, MD;  Location: [facility];  Service: Gastroenterology;  Laterality: N/A;  . HEMORRHOID SURGERY  ?1990's  . INGUINAL HERNIA REPAIR Bilateral ?2000's  . IVC FILTER INSERTION N/A 10/03/2016   Procedure: IVC Filter Retrieveal;  Surgeon: Serafina Mitchell, MD;  Location: [facility];  Service: Cardiovascular;  Laterality: N/A;  . MYRINGOTOMY WITH TUBE PLACEMENT Right 06/26/2018  . SHOULDER ARTHROSCOPY W/ ROTATOR CUFF REPAIR Right 11/2013  . STAPLING OF BLEBS Right 07/31/2016   Procedure:  BLEBECTOMY;  Surgeon: Melrose Nakayama, MD;  Location: [place];  Service: Thoracic;  Laterality: Right;  . VENA CAVA FILTER PLACEMENT Right 08/04/2016   Procedure: INSERTION VENA-CAVA FILTER;  Surgeon: Melrose Nakayama, MD;  Location: [place];  Service: Thoracic;  Laterality: Right;  Marland Kitchen VIDEO ASSISTED THORACOSCOPY Right 07/31/2016   Procedure: VIDEO ASSISTED THORACOSCOPY;  Surgeon: Melrose Nakayama, MD;  Location: [place];  Service: Thoracic;  Laterality: Right;  Marland Kitchen VIDEO ASSISTED THORACOSCOPY Right 08/04/2016   Procedure: REDO VIDEO ASSISTED THORACOSCOPY- EVACUATION OF HEMOTHORAX;  Surgeon: Melrose Nakayama, MD;  Location: [facility];  Service: Thoracic;  Laterality: Right;  Marland Kitchen VIDEO BRONCHOSCOPY WITH ENDOBRONCHIAL NAVIGATION N/A 03/11/2014   Procedure: VIDEO BRONCHOSCOPY WITH ENDOBRONCHIAL NAVIGATION;  Surgeon: Collene Gobble, MD;  Location: [facility];  Service: Thoracic;  Laterality: N/A;   Social History   Socioeconomic History  . Marital status: Married    Spouse name: Not on file  . Number of children: 2  . Years of education: Not on file  . Highest education level: Not on file  Occupational History  . Occupation: retired  Tobacco Use  . Smoking status: Former Smoker    Packs/day: 1.00    Years: 15.00    Pack years: 15.00    Types: Cigarettes  . Smokeless tobacco: Never Used  . Tobacco comment: "quit smoking cigarettes in the 1990's"  Substance and Sexual Activity  . Alcohol use: Yes    Alcohol/week: 0.0 standard drinks    Comment: patient states he stopped 4-5  days ago  . 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 Sexual activity: Yes  Other Topics Concern  . Not on file  Social History Narrative  . Not on file   Social Determinants of Health   Financial Resource Strain:   . Difficulty of Paying Living Expenses:   Food Insecurity:   . Worried About Charity fundraiser in the Last Year:   . Arboriculturist in the Last Year:    Transportation Needs:   . Film/video editor (Medical):   Marland Kitchen Lack of Transportation (Non-Medical):   Physical Activity:   . Days of Exercise per Week:   . Minutes of Exercise per Session:   Stress:   . Feeling of Stress :   Social Connections:   . Frequency of Communication with Friends and Family:   . Frequency of Social Gatherings with Friends and Family:   . Attends Religious Services:   . Active Member of Clubs or Organizations:   . Attends Archivist Meetings:   Marland Kitchen Marital Status:    Family History  Problem Relation Age of Onset  . Heart disease Mother   . Heart disease Father   . Kidney disease Sister        Renal transplant  . Colon cancer Neg Hx   . Rectal cancer Neg Hx    No Known Allergies Prior to Admission medications   Medication Sig Start Date End Date Taking? Authorizing Provider  albuterol (VENTOLIN HFA) 108 (90 Base) MCG/ACT inhaler Inhale 2 puffs into the lungs every 6 (six) hours as needed for wheezing or shortness of breath. 11/25/18  Yes Lauraine Rinne, NP  amLODipine (NORVASC) 10 MG tablet Take 1 tablet by mouth once daily 02/17/19  Yes Biagio Borg, MD  ezetimibe (ZETIA) 10 MG tablet Take 1 tablet by mouth once daily 04/14/19  Yes Biagio Borg, MD  polyethylene glycol-electrolytes (NULYTELY/GOLYTELY) 420 g solution Take 4,000 mLs by mouth as directed. 09/20/18  Yes Milus Banister, MD  rosuvastatin (CRESTOR) 40 MG tablet Take 1 tablet by mouth once daily 02/17/19  Yes Biagio Borg, MD  tadalafil (ADCIRCA/CIALIS) 20 MG tablet TAKE 1 TABLET BY MOUTH ONCE DAILY AS NEEDED FOR  ERECTILE  DYSFUNCTION 03/18/18  Yes Biagio Borg, MD  temazepam (RESTORIL) 15 MG capsule TAKE 1 TO 2 CAPSULES BY MOUTH AT BEDTIME AS NEEDED FOR SLEEP 10/11/18  Yes Biagio Borg, MD  tiZANidine (ZANAFLEX) 2 MG tablet Take 1 tablet (2 mg total) by mouth 2 (two) times daily as needed for muscle spasms. 07/18/18  Yes Biagio Borg, MD  warfarin (COUMADIN) 5 MG tablet TAKE 1 TABLET  BY MOUTH ONCE DAILY OR AS DIRECTED BY ANTICOAGULATION CLINIC 06/11/19  Yes Biagio Borg, MD     Positive ROS: Otherwise negative  All other systems have been reviewed and were otherwise negative with the exception of those mentioned in the HPI and as above.  Physical Exam: Constitutional: Alert, well-appearing, no acute distress Ears: External ears without lesions or tenderness. Ear canals.  Left ear canal and left TM are clear.  Right ear canal reveals some cerumen that was removed.  The T-tube is still intact and patent and the TM is clear although he has some crusting anteriorly around the base of the T-tube.  The T-tube is starting to extrude.  On tuning fork testing he heard about the same in both ears  with a 512 tuning fork.  AC > BC bilaterally and Weber was midline.. Nasal: External nose without lesions. Clear nasal passages Oral: Oropharynx clear. Neck: No palpable adenopathy or masses Respiratory: Breathing comfortably  Skin: No facial/neck lesions or rash noted.  Cerumen impaction removal  Date/Time: 06/18/2019 8:44 AM Performed by: Rozetta Nunnery, MD Authorized by: Rozetta Nunnery, MD   Consent:    Consent obtained:  Verbal   Consent given by:  Patient   Risks discussed:  Pain and bleeding Procedure details:    Location:  R ear   Procedure type: curette and forceps   Post-procedure details:    Inspection:  TM intact and canal normal   Hearing quality:  Improved   Patient tolerance of procedure:  Tolerated well, no immediate complications Comments:     Left ear canal left TM are clear.  Right ear canal reveals some wax that was removed as well as a T-tube with some crusting around the base of the T-tube that could not be removed.  TM was clear otherwise and tube was patent.    Assessment: Minimal cerumen buildup on the right side Patent T-tube status post placement 1 year ago which is starting to extrude.  Plan: He will follow-up in 6 months for  recheck.  He will return earlier if he has any problems. Briefly discussed possible audiologic testing if he continues to have trouble with his hearing.  Radene Journey, MD

## 2019-07-08 ENCOUNTER — Ambulatory Visit: Payer: Medicare Other

## 2019-07-16 ENCOUNTER — Ambulatory Visit (INDEPENDENT_AMBULATORY_CARE_PROVIDER_SITE_OTHER): Payer: Medicare Other | Admitting: Otolaryngology

## 2019-07-17 ENCOUNTER — Other Ambulatory Visit: Payer: Self-pay | Admitting: Internal Medicine

## 2019-07-17 ENCOUNTER — Other Ambulatory Visit: Payer: Self-pay

## 2019-07-17 ENCOUNTER — Ambulatory Visit (INDEPENDENT_AMBULATORY_CARE_PROVIDER_SITE_OTHER): Payer: Medicare Other | Admitting: General Practice

## 2019-07-17 DIAGNOSIS — Z7901 Long term (current) use of anticoagulants: Secondary | ICD-10-CM | POA: Diagnosis not present

## 2019-07-17 DIAGNOSIS — I2699 Other pulmonary embolism without acute cor pulmonale: Secondary | ICD-10-CM

## 2019-07-17 LAB — POCT INR: INR: 3.3 — AB (ref 2.0–3.0)

## 2019-07-17 NOTE — Progress Notes (Signed)
Medical screening examination/treatment/procedure(s) were performed by non-physician practitioner and as supervising physician I was immediately available for consultation/collaboration. I agree with above. Charlann Wayne, MD   

## 2019-07-17 NOTE — Telephone Encounter (Signed)
Done erx 

## 2019-07-17 NOTE — Patient Instructions (Addendum)
Pre visit review using our clinic review tool, if applicable. No additional management support is needed unless otherwise documented below in the visit note.  Skip dosage tomorrow and then change dosage and take 1 tablet daily except 1 1/2 tablets on Mondays only.  Re-check in 4 weeks.

## 2019-08-06 ENCOUNTER — Other Ambulatory Visit: Payer: Self-pay | Admitting: Internal Medicine

## 2019-08-06 NOTE — Telephone Encounter (Signed)
Please refill as per office routine med refill policy (all routine meds refilled for 3 mo or monthly per pt preference up to one year from last visit, then month to month grace period for 3 mo, then further med refills will have to be denied)  

## 2019-08-07 NOTE — Telephone Encounter (Signed)
    Patient calling to report he has no medication remaining Next office visit 76

## 2019-08-14 ENCOUNTER — Ambulatory Visit (INDEPENDENT_AMBULATORY_CARE_PROVIDER_SITE_OTHER): Payer: Medicare Other | Admitting: General Practice

## 2019-08-14 ENCOUNTER — Other Ambulatory Visit: Payer: Self-pay

## 2019-08-14 DIAGNOSIS — Z7901 Long term (current) use of anticoagulants: Secondary | ICD-10-CM

## 2019-08-14 DIAGNOSIS — I2699 Other pulmonary embolism without acute cor pulmonale: Secondary | ICD-10-CM

## 2019-08-14 LAB — POCT INR: INR: 1.7 — AB (ref 2.0–3.0)

## 2019-08-14 NOTE — Patient Instructions (Addendum)
Pre visit review using our clinic review tool, if applicable. No additional management support is needed unless otherwise documented below in the visit note.  Take 1 1/2 tablets today and then change dosage back to 1 tablet daily except 1 1/2 tablets on Mondays and Thursdays.  Re-check in 4 weeks.

## 2019-08-14 NOTE — Progress Notes (Signed)
Medical screening examination/treatment/procedure(s) were performed by non-physician practitioner and as supervising physician I was immediately available for consultation/collaboration. I agree with above. Rylah Fukuda, MD   

## 2019-08-20 ENCOUNTER — Other Ambulatory Visit: Payer: Self-pay | Admitting: Internal Medicine

## 2019-08-20 NOTE — Telephone Encounter (Signed)
Please refill as per office routine med refill policy (all routine meds refilled for 3 mo or monthly per pt preference up to one year from last visit, then month to month grace period for 3 mo, then further med refills will have to be denied)  

## 2019-08-26 ENCOUNTER — Ambulatory Visit (INDEPENDENT_AMBULATORY_CARE_PROVIDER_SITE_OTHER): Payer: Medicare Other | Admitting: Internal Medicine

## 2019-08-26 ENCOUNTER — Telehealth: Payer: Self-pay | Admitting: Internal Medicine

## 2019-08-26 ENCOUNTER — Encounter: Payer: Self-pay | Admitting: Internal Medicine

## 2019-08-26 ENCOUNTER — Other Ambulatory Visit: Payer: Self-pay

## 2019-08-26 VITALS — BP 130/80 | HR 71 | Temp 98.0°F | Ht 72.0 in | Wt 171.0 lb

## 2019-08-26 DIAGNOSIS — N189 Chronic kidney disease, unspecified: Secondary | ICD-10-CM | POA: Diagnosis not present

## 2019-08-26 DIAGNOSIS — N183 Chronic kidney disease, stage 3 unspecified: Secondary | ICD-10-CM

## 2019-08-26 DIAGNOSIS — Z7901 Long term (current) use of anticoagulants: Secondary | ICD-10-CM

## 2019-08-26 DIAGNOSIS — E559 Vitamin D deficiency, unspecified: Secondary | ICD-10-CM

## 2019-08-26 DIAGNOSIS — I10 Essential (primary) hypertension: Secondary | ICD-10-CM

## 2019-08-26 DIAGNOSIS — R972 Elevated prostate specific antigen [PSA]: Secondary | ICD-10-CM | POA: Diagnosis not present

## 2019-08-26 DIAGNOSIS — E538 Deficiency of other specified B group vitamins: Secondary | ICD-10-CM

## 2019-08-26 DIAGNOSIS — E785 Hyperlipidemia, unspecified: Secondary | ICD-10-CM | POA: Diagnosis not present

## 2019-08-26 DIAGNOSIS — D631 Anemia in chronic kidney disease: Secondary | ICD-10-CM

## 2019-08-26 LAB — CBC WITH DIFFERENTIAL/PLATELET
Basophils Absolute: 0 10*3/uL (ref 0.0–0.1)
Basophils Relative: 0.7 % (ref 0.0–3.0)
Eosinophils Absolute: 0.2 10*3/uL (ref 0.0–0.7)
Eosinophils Relative: 3 % (ref 0.0–5.0)
HCT: 34 % — ABNORMAL LOW (ref 39.0–52.0)
Hemoglobin: 11.3 g/dL — ABNORMAL LOW (ref 13.0–17.0)
Lymphocytes Relative: 28.7 % (ref 12.0–46.0)
Lymphs Abs: 1.7 10*3/uL (ref 0.7–4.0)
MCHC: 33.2 g/dL (ref 30.0–36.0)
MCV: 93.3 fl (ref 78.0–100.0)
Monocytes Absolute: 0.9 10*3/uL (ref 0.1–1.0)
Monocytes Relative: 14.9 % — ABNORMAL HIGH (ref 3.0–12.0)
Neutro Abs: 3.2 10*3/uL (ref 1.4–7.7)
Neutrophils Relative %: 52.7 % (ref 43.0–77.0)
Platelets: 191 10*3/uL (ref 150.0–400.0)
RBC: 3.65 Mil/uL — ABNORMAL LOW (ref 4.22–5.81)
RDW: 16.1 % — ABNORMAL HIGH (ref 11.5–15.5)
WBC: 6 10*3/uL (ref 4.0–10.5)

## 2019-08-26 LAB — URINALYSIS, ROUTINE W REFLEX MICROSCOPIC
Bilirubin Urine: NEGATIVE
Ketones, ur: NEGATIVE
Leukocytes,Ua: NEGATIVE
Nitrite: NEGATIVE
Specific Gravity, Urine: 1.015 (ref 1.000–1.030)
Total Protein, Urine: NEGATIVE
Urine Glucose: NEGATIVE
Urobilinogen, UA: 0.2 (ref 0.0–1.0)
WBC, UA: NONE SEEN (ref 0–?)
pH: 6 (ref 5.0–8.0)

## 2019-08-26 LAB — BASIC METABOLIC PANEL
BUN: 22 mg/dL (ref 6–23)
CO2: 23 mEq/L (ref 19–32)
Calcium: 9.3 mg/dL (ref 8.4–10.5)
Chloride: 108 mEq/L (ref 96–112)
Creatinine, Ser: 2.09 mg/dL — ABNORMAL HIGH (ref 0.40–1.50)
GFR: 38.52 mL/min — ABNORMAL LOW (ref >60.00)
Glucose, Bld: 76 mg/dL (ref 70–99)
Potassium: 5.3 mEq/L — ABNORMAL HIGH (ref 3.5–5.1)
Sodium: 139 mEq/L (ref 135–145)

## 2019-08-26 LAB — PSA: PSA: 1.97 ng/mL (ref 0.10–4.00)

## 2019-08-26 LAB — LIPID PANEL
Cholesterol: 204 mg/dL — ABNORMAL HIGH (ref 0–200)
HDL: 52.4 mg/dL (ref >39.00)
LDL Cholesterol: 123 mg/dL — ABNORMAL HIGH (ref 0–99)
NonHDL: 151.63
Total CHOL/HDL Ratio: 4
Triglycerides: 141 mg/dL (ref 0.0–149.0)
VLDL: 28.2 mg/dL (ref 0.0–40.0)

## 2019-08-26 LAB — HEPATIC FUNCTION PANEL
ALT: 8 U/L (ref 0–53)
AST: 15 U/L (ref 0–37)
Albumin: 4.2 g/dL (ref 3.5–5.2)
Alkaline Phosphatase: 35 U/L — ABNORMAL LOW (ref 39–117)
Bilirubin, Direct: 0.1 mg/dL (ref 0.0–0.3)
Total Bilirubin: 0.5 mg/dL (ref 0.2–1.2)
Total Protein: 7.2 g/dL (ref 6.0–8.3)

## 2019-08-26 LAB — VITAMIN B12: Vitamin B-12: 1051 pg/mL — ABNORMAL HIGH (ref 211–911)

## 2019-08-26 LAB — VITAMIN D 25 HYDROXY (VIT D DEFICIENCY, FRACTURES): VITD: 60.1 ng/mL (ref 30.00–100.00)

## 2019-08-26 LAB — TSH: TSH: 2.77 u[IU]/mL (ref 0.35–4.50)

## 2019-08-26 MED ORDER — ALBUTEROL SULFATE HFA 108 (90 BASE) MCG/ACT IN AERS: 2.0000 | INHALATION_SPRAY | Freq: Four times a day (QID) | RESPIRATORY_TRACT | 5 refills | Status: DC | PRN

## 2019-08-26 MED ORDER — APIXABAN 5 MG PO TABS
5.0000 mg | ORAL_TABLET | Freq: Two times a day (BID) | ORAL | 11 refills | Status: DC
Start: 2019-08-26 — End: 2019-09-30

## 2019-08-26 MED ORDER — AMLODIPINE BESYLATE 10 MG PO TABS: 10.0000 mg | ORAL_TABLET | Freq: Every day | ORAL | 3 refills | Status: DC

## 2019-08-26 MED ORDER — EZETIMIBE 10 MG PO TABS: 10.0000 mg | ORAL_TABLET | Freq: Every day | ORAL | 3 refills | Status: DC

## 2019-08-26 MED ORDER — ROSUVASTATIN CALCIUM 40 MG PO TABS: 40.0000 mg | ORAL_TABLET | Freq: Every day | ORAL | 3 refills | Status: DC

## 2019-08-26 NOTE — Assessment & Plan Note (Addendum)
Ok for change coumadinto eliquis 5 bid  I spent 31 minutes in preparing to see the patient by review of recent labs, imaging and procedures, obtaining and reviewing separately obtained history, communicating with the patient and family or caregiver, ordering medications, tests or procedures, and documenting clinical information in the EHR including the differential Dx, treatment, and any further evaluation and other management of anticoagulation, anemi, ckd, htn, hld, psa velocity . Vit d defciency

## 2019-08-26 NOTE — Assessment & Plan Note (Signed)
For f/u lab, cont oral replacement 

## 2019-08-26 NOTE — Telephone Encounter (Signed)
Pt c/o medication issue:  1. Name of Medication: apixaban (ELIQUIS) 5 MG TABS tablet  2. How are you currently taking this medication (dosage and times per day)? n/a  3. Are you having a reaction (difficulty breathing--STAT)? no  4. What is your medication issue? Patient states medication is too costly ($33)

## 2019-08-26 NOTE — Assessment & Plan Note (Signed)
For f/u lab 

## 2019-08-26 NOTE — Patient Instructions (Signed)
Ok to stop the coumadin  Please take all new medication as prescribed - the eliquis 5 mg twice per day  Please continue all other medications as before, and refills have been done if requested.  Please have the pharmacy call with any other refills you may need.  Please continue your efforts at being more active, low cholesterol diet, and weight control.  You are otherwise up to date with prevention measures today.  Please keep your appointments with your specialists as you may have planned  Please go to the LAB at the blood drawing area for the tests to be done  You will be contacted by phone if any changes need to be made immediately.  Otherwise, you will receive a letter about your results with an explanation, but please check with MyChart first.  Please remember to sign up for MyChart if you have not done so, as this will be important to you in the future with finding out test results, communicating by private email, and scheduling acute appointments online when needed.  Please make an Appointment to return in 6 months, or sooner if needed

## 2019-08-26 NOTE — Assessment & Plan Note (Signed)
stable overall by history and exam, recent data reviewed with pt, and pt to continue medical treatment as before,  to f/u any worsening symptoms or concerns, for f/u lab 

## 2019-08-26 NOTE — Assessment & Plan Note (Signed)
Asympt, for f/u psa 

## 2019-08-26 NOTE — Telephone Encounter (Signed)
Sent to Dr. John to advise. 

## 2019-08-26 NOTE — Addendum Note (Signed)
Addended by: Cresenciano Lick on: 08/26/2019 10:15 AM   Modules accepted: Orders

## 2019-08-26 NOTE — Progress Notes (Signed)
Subjective:    Patient ID: Charles [name], male    DOB: [birth year], 69 y.o.   MRN: [id number]  HPI  Here for yearly f/u;  Overall doing ok;  Pt denies Chest pain, worsening SOB, DOE, wheezing, orthopnea, PND, worsening LE edema, palpitations, dizziness or syncope.  Pt denies neurological change such as new headache, facial or extremity weakness.  Pt denies polydipsia, polyuria, or low sugar symptoms. Pt states overall good compliance with treatment and medications, good tolerability, and has been trying to follow appropriate diet.  Pt denies worsening depressive symptoms, suicidal ideation or panic. No fever, night sweats, wt loss, loss of appetite, or other constitutional symptoms.  Pt states good ability with ADL's, has low fall risk, home safety reviewed and adequate, no other significant changes in hearing or vision, and only occasionally active with exercise. OK with change of coumadin to eliquis to avoid INR checks Past Medical History:  Diagnosis Date  . ABNORMAL ELECTROCARDIOGRAM 11/02/2008  . ABSCESS, LUNG 10/23/2006  . Anemia 05/26/2014  . BACTERIAL PNEUMONIA 12/28/2009  . BPH (benign prostatic hyperplasia) 11/22/2010  . 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 CHEST PAIN 12/24/2009  . Coronary artery calcification seen on CT scan 06/01/2015  . Cramp of limb 07/19/2007  . DIVERTICULOSIS, COLON 03/07/2007  . DVT (deep venous thrombosis) ([place]) 01/2014   LLE  . Emphysema of lung ([place])   . 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/23/2006  . FLANK PAIN, LEFT 02/25/2010  . FREQUENCY, URINARY 12/24/2009  . HEMORRHOIDS 10/23/2006  . HYPERLIPIDEMIA 10/23/2006  . HYPERTENSION 10/23/2006  . PE (pulmonary embolism) 02/2014  . PLANTAR FASCIITIS, LEFT 11/02/2008  . Pneumonia 12/2013 X 2  . PSA, INCREASED 11/20/2007  . PULMONARY NODULE 05/14/2008  . Unspecified Peripheral Vascular Disease 10/23/2006   Past Surgical History:  Procedure Laterality Date  . ESOPHAGOGASTRODUODENOSCOPY (EGD) WITH PROPOFOL N/A 06/15/2017   Procedure:  ESOPHAGOGASTRODUODENOSCOPY (EGD) WITH PROPOFOL;  Surgeon: Jerene Bears, MD;  [facility]: [facility];  Service: Gastroenterology;  Laterality: N/A;  . HEMORRHOID SURGERY  ?1990's  . INGUINAL HERNIA REPAIR Bilateral ?2000's  . IVC FILTER INSERTION N/A 10/03/2016   Procedure: IVC Filter Retrieveal;  Surgeon: Serafina Mitchell, MD;  Location: [place] [facility];  Service: Cardiovascular;  Laterality: N/A;  . MYRINGOTOMY WITH TUBE PLACEMENT Right 06/26/2018  . SHOULDER ARTHROSCOPY W/ ROTATOR CUFF REPAIR Right 11/2013  . STAPLING OF BLEBS Right 07/31/2016   Procedure: BLEBECTOMY;  Surgeon: Melrose Nakayama, MD;  Location: [place];  Service: Thoracic;  Laterality: Right;  . VENA CAVA FILTER PLACEMENT Right 08/04/2016   Procedure: INSERTION VENA-CAVA FILTER;  Surgeon: Melrose Nakayama, MD;  Location: [place];  Service: Thoracic;  Laterality: Right;  Marland Kitchen VIDEO ASSISTED THORACOSCOPY Right 07/31/2016   Procedure: VIDEO ASSISTED THORACOSCOPY;  Surgeon: Melrose Nakayama, MD;  Location: [place];  Service: Thoracic;  Laterality: Right;  Marland Kitchen VIDEO ASSISTED THORACOSCOPY Right 08/04/2016   Procedure: REDO VIDEO ASSISTED THORACOSCOPY- EVACUATION OF HEMOTHORAX;  Surgeon: Melrose Nakayama, MD;  Location: [facility];  Service: Thoracic;  Laterality: Right;  Marland Kitchen VIDEO BRONCHOSCOPY WITH ENDOBRONCHIAL NAVIGATION N/A 03/11/2014   Procedure: VIDEO BRONCHOSCOPY WITH ENDOBRONCHIAL NAVIGATION;  Surgeon: Collene Gobble, MD;  Location: [place];  Service: Thoracic;  Laterality: N/A;    reports that he has quit smoking. His smoking use included cigarettes. He has a 15.00 pack-year smoking history. He has never used smokeless tobacco. He reports current alcohol use. He reports current drug use. Drug: Marijuana. family history includes Heart disease  in his father and mother; Kidney disease in his sister. No Known Allergies Current Outpatient Medications on File Prior to Visit  Medication Sig Dispense Refill  . tadalafil (CIALIS) 20  MG tablet TAKE 1 TABLET BY MOUTH ONCE DAILY AS NEEDED FOR ERECTILE DYSFUNCTION 6 tablet 11  . temazepam (RESTORIL) 15 MG capsule TAKE 1 TO 2 CAPSULES BY MOUTH AT BEDTIME AS NEEDED FOR SLEEP 60 capsule 5   No current facility-administered medications on file prior to visit.   Review of Systems All otherwise neg per pt     Objective:   Physical Exam BP 130/80 (BP Location: Left Arm, Patient Position: Sitting, Cuff Size: Large)   Pulse 71   Temp 98 F (36.7 C) (Oral)   Ht 6' (1.829 m)   Wt 171 lb (77.6 kg)   SpO2 98%   BMI 23.19 kg/m  VS noted,  Constitutional: Pt appears in NAD HENT: Head: NCAT.  Right Ear: External ear normal.  Left Ear: External ear normal.  Eyes: . Pupils are equal, round, and reactive to light. Conjunctivae and EOM are normal Nose: without d/c or deformity Neck: Neck supple. Gross normal ROM Cardiovascular: Normal rate and regular rhythm.   Pulmonary/Chest: Effort normal and breath sounds without rales or wheezing.  Abd:  Soft, NT, ND, + BS, no organomegaly Neurological: Pt is alert. At baseline orientation, motor grossly intact Skin: Skin is warm. No rashes, other new lesions, no LE edema Psychiatric: Pt behavior is normal without agitation  All otherwise neg per pt Lab Results  Component Value Date   WBC 8.5 12/11/2018   HGB 13.3 12/11/2018   HCT 39.3 12/11/2018   PLT 167.0 12/11/2018   GLUCOSE 97 12/11/2018   CHOL 205 (H) 12/11/2018   TRIG 133.0 12/11/2018   HDL 89.40 12/11/2018   LDLDIRECT 81.0 07/16/2018   LDLCALC 89 12/11/2018   ALT 53 12/11/2018   AST 86 (H) 12/11/2018   NA 138 12/11/2018   K 4.7 12/11/2018   CL 106 12/11/2018   CREATININE 2.03 (H) 12/11/2018   BUN 25 (H) 12/11/2018   CO2 25 12/11/2018   TSH 3.58 07/16/2018   PSA 2.82 12/11/2018   INR 1.7 (A) 08/14/2019   HGBA1C 4.2 (L) 12/11/2018   MICROALBUR 1.5 08/25/2015       Assessment & Plan:

## 2019-08-26 NOTE — Telephone Encounter (Signed)
That is unfortunate, ok to let pt know to continue his coumadin that he is taking now

## 2019-08-27 NOTE — Telephone Encounter (Signed)
Spoke to pt and informed him of Dr. Gwynn Burly note. Pt understood and has no questions or concerns at this time.

## 2019-09-05 ENCOUNTER — Ambulatory Visit (INDEPENDENT_AMBULATORY_CARE_PROVIDER_SITE_OTHER): Payer: Medicare Other | Admitting: Internal Medicine

## 2019-09-05 ENCOUNTER — Encounter: Payer: Self-pay | Admitting: Internal Medicine

## 2019-09-05 ENCOUNTER — Other Ambulatory Visit: Payer: Self-pay

## 2019-09-05 VITALS — BP 110/72 | HR 96 | Temp 98.5°F | Ht 72.0 in | Wt 162.0 lb

## 2019-09-05 DIAGNOSIS — N189 Chronic kidney disease, unspecified: Secondary | ICD-10-CM

## 2019-09-05 DIAGNOSIS — N183 Chronic kidney disease, stage 3 unspecified: Secondary | ICD-10-CM

## 2019-09-05 DIAGNOSIS — R1310 Dysphagia, unspecified: Secondary | ICD-10-CM

## 2019-09-05 DIAGNOSIS — E876 Hypokalemia: Secondary | ICD-10-CM

## 2019-09-05 DIAGNOSIS — D631 Anemia in chronic kidney disease: Secondary | ICD-10-CM

## 2019-09-05 DIAGNOSIS — I10 Essential (primary) hypertension: Secondary | ICD-10-CM

## 2019-09-05 LAB — FERRITIN: Ferritin: 53 ng/mL (ref 24–380)

## 2019-09-05 MED ORDER — PANTOPRAZOLE SODIUM 40 MG PO TBEC: 40.0000 mg | DELAYED_RELEASE_TABLET | Freq: Two times a day (BID) | ORAL | 1 refills | Status: DC

## 2019-09-05 NOTE — Progress Notes (Signed)
Subjective:    Patient ID: Charles [name], male    DOB: 03-Apr-1950, 69 y.o.   MRN: [id number]  HPI  Here with c/o worsening dysphagia to solids in the past 3 wks, associated with worsening burning and sour brash only somewhat improved with TUMS.  Pt denies chest pain, increased sob or doe, wheezing, orthopnea, PND, increased LE swelling, palpitations, dizziness or syncope.  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   CHEST PAIN 12/24/2009   Coronary artery calcification seen on CT scan 06/01/2015   Cramp of limb 07/19/2007   DIVERTICULOSIS, COLON 03/07/2007   DVT (deep venous thrombosis) ([place]) 01/2014   LLE   Emphysema of lung ([place])    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/23/2006   FLANK PAIN, LEFT 02/25/2010   FREQUENCY, URINARY 12/24/2009   HEMORRHOIDS 10/23/2006   HYPERLIPIDEMIA 10/23/2006   HYPERTENSION 10/23/2006   PE (pulmonary embolism) 02/2014   PLANTAR FASCIITIS, LEFT 11/02/2008   Pneumonia 12/2013 X 2   PSA, INCREASED 11/20/2007   PULMONARY NODULE 05/14/2008   Unspecified Peripheral Vascular Disease 10/23/2006   Past Surgical History:  Procedure Laterality Date   ESOPHAGOGASTRODUODENOSCOPY (EGD) WITH PROPOFOL N/A 06/15/2017   Procedure: ESOPHAGOGASTRODUODENOSCOPY (EGD) WITH PROPOFOL;  Surgeon: Jerene Bears, MD;  Location: [facility];  Service: Gastroenterology;  Laterality: N/A;   HEMORRHOID SURGERY  ?1990's   INGUINAL HERNIA REPAIR Bilateral ?2000's   IVC FILTER INSERTION N/A 10/03/2016   Procedure: IVC Filter Retrieveal;  Surgeon: Serafina Mitchell, MD;  Location: [facility];  Service: Cardiovascular;  Laterality: N/A;     MYRINGOTOMY WITH TUBE PLACEMENT Right 06/26/2018   SHOULDER ARTHROSCOPY W/ ROTATOR CUFF REPAIR Right 11/2013   STAPLING OF BLEBS Right 07/31/2016   Procedure: BLEBECTOMY;  Surgeon: Melrose Nakayama, MD;  Location: [place];  Service: Thoracic;  Laterality: Right;   VENA CAVA FILTER PLACEMENT Right 08/04/2016   Procedure: INSERTION VENA-CAVA FILTER;  Surgeon: Melrose Nakayama, MD;  Location: [place];  Service: Thoracic;  Laterality: Right;   VIDEO ASSISTED THORACOSCOPY Right 07/31/2016   Procedure: VIDEO ASSISTED THORACOSCOPY;  Surgeon: Melrose Nakayama, MD;  Location: [place];  Service: Thoracic;  Laterality: Right;   VIDEO ASSISTED THORACOSCOPY Right 08/04/2016   Procedure: REDO VIDEO ASSISTED THORACOSCOPY- EVACUATION OF HEMOTHORAX;  Surgeon: Melrose Nakayama, MD;  Location: [place];  Service: Thoracic;  Laterality: Right;   VIDEO BRONCHOSCOPY WITH ENDOBRONCHIAL NAVIGATION N/A 03/11/2014   Procedure: VIDEO BRONCHOSCOPY WITH ENDOBRONCHIAL NAVIGATION;  Surgeon: Collene Gobble, MD;  Location: [facility];  Service: Thoracic;  Laterality: N/A;    reports that he has quit smoking. His smoking use included cigarettes. He has a 15.00 pack-year smoking history. He has never used smokeless tobacco. He reports current alcohol use. He reports current drug use. Drug: Marijuana. family history includes Heart disease in his father and mother; Kidney disease in his sister. No Known Allergies . Current Outpatient Medications on File Prior to Visit  Medication Sig Dispense Refill   albuterol (VENTOLIN HFA) 108 (90 Base) MCG/ACT inhaler Inhale 2 puffs into the lungs every 6 (six) hours as needed for wheezing or shortness of breath. 8 g 5  amLODipine (NORVASC) 10 MG tablet Take 1 tablet (10 mg total) by mouth daily. 90 tablet 3   apixaban (ELIQUIS) 5 MG TABS tablet Take 1 tablet (5 mg total) by mouth 2 (two) times daily. 60 tablet 11   ezetimibe (ZETIA) 10 MG tablet Take 1 tablet (10 mg total) by  mouth daily. Annual appt due in Oct must see provider for future refills 90 tablet 3   rosuvastatin (CRESTOR) 40 MG tablet Take 1 tablet (40 mg total) by mouth daily. 90 tablet 3   tadalafil (CIALIS) 20 MG tablet TAKE 1 TABLET BY MOUTH ONCE DAILY AS NEEDED FOR ERECTILE DYSFUNCTION 6 tablet 11   temazepam (RESTORIL) 15 MG capsule TAKE 1 TO 2 CAPSULES BY MOUTH AT BEDTIME AS NEEDED FOR SLEEP 60 capsule 5   No current facility-administered medications on file prior to visit.   Review of Systems All otherwise neg per pt     Objective:   Physical Exam BP 110/72 (BP Location: Left Arm, Patient Position: Sitting, Cuff Size: Large)    Pulse 96    Temp 98.5 F (36.9 C) (Oral)    Ht 6' (1.829 m)    Wt 162 lb (73.5 kg)    SpO2 97%    BMI 21.97 kg/m  VS noted,  Constitutional: Pt appears in NAD HENT: Head: NCAT.  Right Ear: External ear normal.  Left Ear: External ear normal.  Eyes: . Pupils are equal, round, and reactive to light. Conjunctivae and EOM are normal Nose: without d/c or deformity Neck: Neck supple. Gross normal ROM Cardiovascular: Normal rate and regular rhythm.   Pulmonary/Chest: Effort normal and breath sounds without rales or wheezing.  Abd:  Soft, NT, ND, + BS, no organomegaly Neurological: Pt is alert. At baseline orientation, motor grossly intact Skin: Skin is warm. No rashes, other new lesions, no LE edema Psychiatric: Pt behavior is normal without agitation  All otherwise neg per pt Lab Results  Component Value Date   WBC 9.1 09/05/2019   HGB 12.1 (L) 09/05/2019   HCT 37.0 (L) 09/05/2019   PLT 169 09/05/2019   GLUCOSE 101 (H) 09/05/2019   CHOL 204 (H) 08/26/2019   TRIG 141.0 08/26/2019   HDL 52.40 08/26/2019   LDLDIRECT 81.0 07/16/2018   LDLCALC 123 (H) 08/26/2019   ALT 8 08/26/2019   AST 15 08/26/2019   NA 137 09/05/2019   K 5.2 09/05/2019   CL 105 09/05/2019   CREATININE 3.39 (H) 09/05/2019   BUN 48 (H) 09/05/2019   CO2 21 09/05/2019   TSH 2.77  08/26/2019   PSA 1.97 08/26/2019   INR 1.7 (A) 08/14/2019   HGBA1C 4.2 (L) 12/11/2018   MICROALBUR 1.5 08/25/2015          Assessment & Plan:

## 2019-09-05 NOTE — Patient Instructions (Signed)
Please take all new medication as prescribed - the protonix twice per day for 1 wk, then once per day  You will be contacted regarding the referral for: Dr Hilarie Fredrickson  Please continue all other medications as before, and refills have been done if requested.  Please have the pharmacy call with any other refills you may need.  Please keep your appointments with your specialists as you may have planned  Please go to the LAB at the blood drawing area for the tests to be done  You will be contacted by phone if any changes need to be made immediately.  Otherwise, you will receive a letter about your results with an explanation, but please check with MyChart first.  Please remember to sign up for MyChart if you have not done so, as this will be important to you in the future with finding out test results, communicating by private email, and scheduling acute appointments online when needed.

## 2019-09-06 ENCOUNTER — Encounter: Payer: Self-pay | Admitting: Internal Medicine

## 2019-09-06 DIAGNOSIS — E876 Hypokalemia: Secondary | ICD-10-CM | POA: Insufficient documentation

## 2019-09-06 LAB — BASIC METABOLIC PANEL
BUN/Creatinine Ratio: 14 (calc) (ref 6–22)
BUN: 48 mg/dL — ABNORMAL HIGH (ref 7–25)
CO2: 21 mmol/L (ref 20–32)
Calcium: 9.5 mg/dL (ref 8.6–10.3)
Chloride: 105 mmol/L (ref 98–110)
Creat: 3.39 mg/dL — ABNORMAL HIGH (ref 0.70–1.25)
Glucose, Bld: 101 mg/dL — ABNORMAL HIGH (ref 65–99)
Potassium: 5.2 mmol/L (ref 3.5–5.3)
Sodium: 137 mmol/L (ref 135–146)

## 2019-09-06 LAB — CBC WITH DIFFERENTIAL/PLATELET
Absolute Monocytes: 1037 cells/uL — ABNORMAL HIGH (ref 200–950)
Basophils Absolute: 73 cells/uL (ref 0–200)
Basophils Relative: 0.8 %
Eosinophils Absolute: 46 cells/uL (ref 15–500)
Eosinophils Relative: 0.5 %
HCT: 37 % — ABNORMAL LOW (ref 38.5–50.0)
Hemoglobin: 12.1 g/dL — ABNORMAL LOW (ref 13.2–17.1)
Lymphs Abs: 1920 cells/uL (ref 850–3900)
MCH: 29.7 pg (ref 27.0–33.0)
MCHC: 32.7 g/dL (ref 32.0–36.0)
MCV: 90.9 fL (ref 80.0–100.0)
MPV: 9.5 fL (ref 7.5–12.5)
Monocytes Relative: 11.4 %
Neutro Abs: 6024 cells/uL (ref 1500–7800)
Neutrophils Relative %: 66.2 %
Platelets: 169 10*3/uL (ref 140–400)
RBC: 4.07 10*6/uL — ABNORMAL LOW (ref 4.20–5.80)
RDW: 14.7 % (ref 11.0–15.0)
Total Lymphocyte: 21.1 %
WBC: 9.1 10*3/uL (ref 3.8–10.8)

## 2019-09-06 LAB — TIQ-MISC

## 2019-09-06 NOTE — Assessment & Plan Note (Signed)
For f/u lab today 

## 2019-09-06 NOTE — Assessment & Plan Note (Addendum)
I suspect functional but r/o mechanical , for protonix 40 bid for 1 wk, then daily, also refer GI for possible EGD  I spent 31 minutes in preparing to see the patient by review of recent labs, imaging and procedures, obtaining and reviewing separately obtained history, communicating with the patient and family or caregiver, ordering medications, tests or procedures, and documenting clinical information in the EHR including the differential Dx, treatment, and any further evaluation and other management of dysphagia, anemia, hypok, ckd, htn

## 2019-09-06 NOTE — Assessment & Plan Note (Signed)
stable overall by history and exam, recent data reviewed with pt, and pt to continue medical treatment as before,  to f/u any worsening symptoms or concerns  

## 2019-09-06 NOTE — Assessment & Plan Note (Signed)
For f//u cbc , iron labs,  to f/u any worsening symptoms or concerns

## 2019-09-06 NOTE — Assessment & Plan Note (Signed)
stable overall by history and exam, recent data reviewed with pt, and pt to continue medical treatment as before,  to f/u any worsening symptoms or concerns le  

## 2019-09-11 ENCOUNTER — Ambulatory Visit: Payer: Medicare Other

## 2019-09-18 ENCOUNTER — Other Ambulatory Visit: Payer: Self-pay

## 2019-09-18 ENCOUNTER — Ambulatory Visit (INDEPENDENT_AMBULATORY_CARE_PROVIDER_SITE_OTHER): Payer: Medicare Other | Admitting: General Practice

## 2019-09-18 DIAGNOSIS — Z7901 Long term (current) use of anticoagulants: Secondary | ICD-10-CM

## 2019-09-18 DIAGNOSIS — I2699 Other pulmonary embolism without acute cor pulmonale: Secondary | ICD-10-CM

## 2019-09-18 LAB — POCT INR: INR: 4.3 — AB (ref 2.0–3.0)

## 2019-09-18 NOTE — Progress Notes (Signed)
Medical screening examination/treatment/procedure(s) were performed by non-physician practitioner and as supervising physician I was immediately available for consultation/collaboration. I agree with above. Milagro Belmares, MD   

## 2019-09-18 NOTE — Patient Instructions (Signed)
Pre visit review using our clinic review tool, if applicable. No additional management support is needed unless otherwise documented below in the visit note.  Hold coumadin today and tomorrow and then change dosage and take 1 tablet daily except 1 1/2 tablets on Mondays only.  Re-check in 2 weeks.

## 2019-09-22 ENCOUNTER — Other Ambulatory Visit: Payer: Self-pay | Admitting: Internal Medicine

## 2019-09-22 NOTE — Telephone Encounter (Signed)
Please refill as per office routine med refill policy (all routine meds refilled for 3 mo or monthly per pt preference up to one year from last visit, then month to month grace period for 3 mo, then further med refills will have to be denied)  

## 2019-09-27 ENCOUNTER — Encounter (HOSPITAL_COMMUNITY): Payer: Self-pay | Admitting: Emergency Medicine

## 2019-09-27 ENCOUNTER — Inpatient Hospital Stay (HOSPITAL_COMMUNITY)
Admission: EM | Admit: 2019-09-27 | Discharge: 2019-09-30 | DRG: 813 | Disposition: A | Discharge status: Home / Self Care | Payer: Medicare Other | Attending: Internal Medicine | Admitting: Internal Medicine

## 2019-09-27 ENCOUNTER — Other Ambulatory Visit: Payer: Self-pay

## 2019-09-27 ENCOUNTER — Emergency Department (HOSPITAL_COMMUNITY): Payer: Medicare Other

## 2019-09-27 DIAGNOSIS — Z8673 Personal history of transient ischemic attack (TIA), and cerebral infarction without residual deficits: Secondary | ICD-10-CM

## 2019-09-27 DIAGNOSIS — Z86718 Personal history of other venous thrombosis and embolism: Secondary | ICD-10-CM

## 2019-09-27 DIAGNOSIS — Z86711 Personal history of pulmonary embolism: Secondary | ICD-10-CM

## 2019-09-27 DIAGNOSIS — E785 Hyperlipidemia, unspecified: Secondary | ICD-10-CM | POA: Diagnosis present

## 2019-09-27 DIAGNOSIS — K219 Gastro-esophageal reflux disease without esophagitis: Secondary | ICD-10-CM | POA: Diagnosis present

## 2019-09-27 DIAGNOSIS — K449 Diaphragmatic hernia without obstruction or gangrene: Secondary | ICD-10-CM

## 2019-09-27 DIAGNOSIS — J439 Emphysema, unspecified: Secondary | ICD-10-CM | POA: Diagnosis present

## 2019-09-27 DIAGNOSIS — I129 Hypertensive chronic kidney disease with stage 1 through stage 4 chronic kidney disease, or unspecified chronic kidney disease: Secondary | ICD-10-CM | POA: Diagnosis present

## 2019-09-27 DIAGNOSIS — D6832 Hemorrhagic disorder due to extrinsic circulating anticoagulants: Principal | ICD-10-CM | POA: Diagnosis present

## 2019-09-27 DIAGNOSIS — Z95828 Presence of other vascular implants and grafts: Secondary | ICD-10-CM

## 2019-09-27 DIAGNOSIS — D62 Acute posthemorrhagic anemia: Secondary | ICD-10-CM | POA: Diagnosis present

## 2019-09-27 DIAGNOSIS — K3189 Other diseases of stomach and duodenum: Secondary | ICD-10-CM

## 2019-09-27 DIAGNOSIS — I82409 Acute embolism and thrombosis of unspecified deep veins of unspecified lower extremity: Secondary | ICD-10-CM | POA: Diagnosis present

## 2019-09-27 DIAGNOSIS — Z87891 Personal history of nicotine dependence: Secondary | ICD-10-CM

## 2019-09-27 DIAGNOSIS — I10 Essential (primary) hypertension: Secondary | ICD-10-CM | POA: Diagnosis present

## 2019-09-27 DIAGNOSIS — T45515A Adverse effect of anticoagulants, initial encounter: Secondary | ICD-10-CM | POA: Diagnosis present

## 2019-09-27 DIAGNOSIS — K921 Melena: Secondary | ICD-10-CM | POA: Diagnosis present

## 2019-09-27 DIAGNOSIS — E860 Dehydration: Secondary | ICD-10-CM | POA: Diagnosis present

## 2019-09-27 DIAGNOSIS — R7989 Other specified abnormal findings of blood chemistry: Secondary | ICD-10-CM

## 2019-09-27 DIAGNOSIS — D689 Coagulation defect, unspecified: Secondary | ICD-10-CM | POA: Diagnosis not present

## 2019-09-27 DIAGNOSIS — K922 Gastrointestinal hemorrhage, unspecified: Secondary | ICD-10-CM

## 2019-09-27 DIAGNOSIS — Z841 Family history of disorders of kidney and ureter: Secondary | ICD-10-CM

## 2019-09-27 DIAGNOSIS — R791 Abnormal coagulation profile: Secondary | ICD-10-CM

## 2019-09-27 DIAGNOSIS — Z8249 Family history of ischemic heart disease and other diseases of the circulatory system: Secondary | ICD-10-CM

## 2019-09-27 DIAGNOSIS — J449 Chronic obstructive pulmonary disease, unspecified: Secondary | ICD-10-CM | POA: Diagnosis present

## 2019-09-27 DIAGNOSIS — K2990 Gastroduodenitis, unspecified, without bleeding: Secondary | ICD-10-CM | POA: Diagnosis present

## 2019-09-27 DIAGNOSIS — K319 Disease of stomach and duodenum, unspecified: Secondary | ICD-10-CM | POA: Diagnosis present

## 2019-09-27 DIAGNOSIS — K2970 Gastritis, unspecified, without bleeding: Secondary | ICD-10-CM

## 2019-09-27 DIAGNOSIS — N183 Chronic kidney disease, stage 3 unspecified: Secondary | ICD-10-CM | POA: Diagnosis present

## 2019-09-27 DIAGNOSIS — N400 Enlarged prostate without lower urinary tract symptoms: Secondary | ICD-10-CM | POA: Diagnosis present

## 2019-09-27 DIAGNOSIS — N184 Chronic kidney disease, stage 4 (severe): Secondary | ICD-10-CM | POA: Diagnosis present

## 2019-09-27 DIAGNOSIS — Z20822 Contact with and (suspected) exposure to covid-19: Secondary | ICD-10-CM | POA: Diagnosis present

## 2019-09-27 DIAGNOSIS — K222 Esophageal obstruction: Secondary | ICD-10-CM | POA: Diagnosis present

## 2019-09-27 DIAGNOSIS — N1832 Chronic kidney disease, stage 3b: Secondary | ICD-10-CM | POA: Diagnosis present

## 2019-09-27 DIAGNOSIS — N179 Acute kidney failure, unspecified: Secondary | ICD-10-CM

## 2019-09-27 DIAGNOSIS — K4020 Bilateral inguinal hernia, without obstruction or gangrene, not specified as recurrent: Secondary | ICD-10-CM | POA: Diagnosis present

## 2019-09-27 LAB — BLOOD GAS, VENOUS
Acid-base deficit: 6.9 mmol/L — ABNORMAL HIGH (ref 0.0–2.0)
Bicarbonate: 16 mmol/L — ABNORMAL LOW (ref 20.0–28.0)
O2 Saturation: 97.8 %
Patient temperature: 98.6
pCO2, Ven: 25.2 mmHg — ABNORMAL LOW (ref 44.0–60.0)
pH, Ven: 7.419 (ref 7.250–7.430)
pO2, Ven: 104 mmHg — ABNORMAL HIGH (ref 32.0–45.0)

## 2019-09-27 LAB — BASIC METABOLIC PANEL
Anion gap: 19 — ABNORMAL HIGH (ref 5–15)
BUN: 57 mg/dL — ABNORMAL HIGH (ref 8–23)
CO2: 15 mmol/L — ABNORMAL LOW (ref 22–32)
Calcium: 7.4 mg/dL — ABNORMAL LOW (ref 8.9–10.3)
Chloride: 98 mmol/L (ref 98–111)
Creatinine, Ser: 3.98 mg/dL — ABNORMAL HIGH (ref 0.61–1.24)
GFR calc Af Amer: 17 mL/min — ABNORMAL LOW (ref >60)
GFR calc non Af Amer: 15 mL/min — ABNORMAL LOW (ref >60)
Glucose, Bld: 206 mg/dL — ABNORMAL HIGH (ref 70–99)
Potassium: 4.4 mmol/L (ref 3.5–5.1)
Sodium: 132 mmol/L — ABNORMAL LOW (ref 135–145)

## 2019-09-27 LAB — BRAIN NATRIURETIC PEPTIDE: B Natriuretic Peptide: 117.3 pg/mL — ABNORMAL HIGH (ref 0.0–100.0)

## 2019-09-27 LAB — TROPONIN I (HIGH SENSITIVITY)
Troponin I (High Sensitivity): 48 ng/L — ABNORMAL HIGH (ref <18)
Troponin I (High Sensitivity): 53 ng/L — ABNORMAL HIGH (ref <18)

## 2019-09-27 LAB — CBC
HCT: 30.6 % — ABNORMAL LOW (ref 39.0–52.0)
Hemoglobin: 10.8 g/dL — ABNORMAL LOW (ref 13.0–17.0)
MCH: 31.6 pg (ref 26.0–34.0)
MCHC: 35.3 g/dL (ref 30.0–36.0)
MCV: 89.5 fL (ref 80.0–100.0)
Platelets: 183 10*3/uL (ref 150–400)
RBC: 3.42 MIL/uL — ABNORMAL LOW (ref 4.22–5.81)
RDW: 14.2 % (ref 11.5–15.5)
WBC: 12.3 10*3/uL — ABNORMAL HIGH (ref 4.0–10.5)
nRBC: 1.9 % — ABNORMAL HIGH (ref 0.0–0.2)

## 2019-09-27 LAB — MAGNESIUM: Magnesium: 2 mg/dL (ref 1.7–2.4)

## 2019-09-27 LAB — POC OCCULT BLOOD, ED: Fecal Occult Bld: POSITIVE — AB

## 2019-09-27 LAB — PROTIME-INR
INR: 9.5 (ref 0.8–1.2)
Prothrombin Time: 74.1 seconds — ABNORMAL HIGH (ref 11.4–15.2)

## 2019-09-27 LAB — LACTIC ACID, PLASMA: Lactic Acid, Venous: 3.1 mmol/L (ref 0.5–1.9)

## 2019-09-27 MED ORDER — VITAMIN K1 10 MG/ML IJ SOLN
10.0000 mg | Freq: Once | INTRAVENOUS | Status: AC
  Administered 2019-09-27: 10 mg via INTRAVENOUS
  Filled 2019-09-27: qty 1

## 2019-09-27 MED ORDER — SODIUM CHLORIDE 0.9 % IV BOLUS
1000.0000 mL | Freq: Once | INTRAVENOUS | Status: AC
  Administered 2019-09-27: 1000 mL via INTRAVENOUS

## 2019-09-27 MED ORDER — MECLIZINE HCL 25 MG PO TABS
25.0000 mg | ORAL_TABLET | Freq: Once | ORAL | Status: AC
  Administered 2019-09-27: 25 mg via ORAL
  Filled 2019-09-27: qty 1

## 2019-09-27 NOTE — ED Notes (Signed)
Date and time results received: 09/27/19 2039 (use smartphrase ".now" to insert current time)  Test: INR Critical Value: 9.5  Name of Provider Notified: Roslynn Amble, MD   Orders Received? Or Actions Taken?: Orders Received - See Orders for details

## 2019-09-27 NOTE — ED Triage Notes (Addendum)
Patient reports dizziness since last night. Reports worsens with movement. Denies headache, blurred vision. States "my balance is off." Also reports SOB on exertion. Hx PE. Reports taking warfarin as prescribed.

## 2019-09-27 NOTE — ED Notes (Signed)
Date and time results received: 09/27/19 2227 (use smartphrase ".now" to insert current time)  Test: lactic acid  Critical Value: 3.1  Name of Provider Notified: Roslynn Amble, MD  Orders Received? Or Actions Taken?: Orders Received - See Orders for details

## 2019-09-28 ENCOUNTER — Encounter (HOSPITAL_COMMUNITY): Payer: Self-pay | Admitting: Internal Medicine

## 2019-09-28 DIAGNOSIS — N400 Enlarged prostate without lower urinary tract symptoms: Secondary | ICD-10-CM | POA: Diagnosis present

## 2019-09-28 DIAGNOSIS — K219 Gastro-esophageal reflux disease without esophagitis: Secondary | ICD-10-CM

## 2019-09-28 DIAGNOSIS — D689 Coagulation defect, unspecified: Secondary | ICD-10-CM

## 2019-09-28 DIAGNOSIS — K222 Esophageal obstruction: Secondary | ICD-10-CM | POA: Diagnosis present

## 2019-09-28 DIAGNOSIS — K319 Disease of stomach and duodenum, unspecified: Secondary | ICD-10-CM | POA: Diagnosis present

## 2019-09-28 DIAGNOSIS — E785 Hyperlipidemia, unspecified: Secondary | ICD-10-CM | POA: Diagnosis present

## 2019-09-28 DIAGNOSIS — Z8249 Family history of ischemic heart disease and other diseases of the circulatory system: Secondary | ICD-10-CM | POA: Diagnosis not present

## 2019-09-28 DIAGNOSIS — K2990 Gastroduodenitis, unspecified, without bleeding: Secondary | ICD-10-CM | POA: Diagnosis present

## 2019-09-28 DIAGNOSIS — R1310 Dysphagia, unspecified: Secondary | ICD-10-CM | POA: Diagnosis not present

## 2019-09-28 DIAGNOSIS — K922 Gastrointestinal hemorrhage, unspecified: Secondary | ICD-10-CM | POA: Diagnosis not present

## 2019-09-28 DIAGNOSIS — Z86711 Personal history of pulmonary embolism: Secondary | ICD-10-CM | POA: Diagnosis not present

## 2019-09-28 DIAGNOSIS — D6832 Hemorrhagic disorder due to extrinsic circulating anticoagulants: Secondary | ICD-10-CM | POA: Diagnosis present

## 2019-09-28 DIAGNOSIS — I10 Essential (primary) hypertension: Secondary | ICD-10-CM | POA: Diagnosis not present

## 2019-09-28 DIAGNOSIS — K449 Diaphragmatic hernia without obstruction or gangrene: Secondary | ICD-10-CM | POA: Diagnosis present

## 2019-09-28 DIAGNOSIS — N183 Chronic kidney disease, stage 3 unspecified: Secondary | ICD-10-CM | POA: Diagnosis not present

## 2019-09-28 DIAGNOSIS — Z20822 Contact with and (suspected) exposure to covid-19: Secondary | ICD-10-CM | POA: Diagnosis present

## 2019-09-28 DIAGNOSIS — Z8673 Personal history of transient ischemic attack (TIA), and cerebral infarction without residual deficits: Secondary | ICD-10-CM | POA: Diagnosis not present

## 2019-09-28 DIAGNOSIS — E860 Dehydration: Secondary | ICD-10-CM | POA: Diagnosis present

## 2019-09-28 DIAGNOSIS — K921 Melena: Secondary | ICD-10-CM

## 2019-09-28 DIAGNOSIS — K4020 Bilateral inguinal hernia, without obstruction or gangrene, not specified as recurrent: Secondary | ICD-10-CM | POA: Diagnosis present

## 2019-09-28 DIAGNOSIS — K3189 Other diseases of stomach and duodenum: Secondary | ICD-10-CM | POA: Diagnosis not present

## 2019-09-28 DIAGNOSIS — K2970 Gastritis, unspecified, without bleeding: Secondary | ICD-10-CM | POA: Diagnosis present

## 2019-09-28 DIAGNOSIS — J439 Emphysema, unspecified: Secondary | ICD-10-CM | POA: Diagnosis present

## 2019-09-28 DIAGNOSIS — N179 Acute kidney failure, unspecified: Secondary | ICD-10-CM | POA: Diagnosis present

## 2019-09-28 DIAGNOSIS — Z841 Family history of disorders of kidney and ureter: Secondary | ICD-10-CM | POA: Diagnosis not present

## 2019-09-28 DIAGNOSIS — Z86718 Personal history of other venous thrombosis and embolism: Secondary | ICD-10-CM | POA: Diagnosis not present

## 2019-09-28 DIAGNOSIS — D62 Acute posthemorrhagic anemia: Secondary | ICD-10-CM | POA: Diagnosis present

## 2019-09-28 DIAGNOSIS — I129 Hypertensive chronic kidney disease with stage 1 through stage 4 chronic kidney disease, or unspecified chronic kidney disease: Secondary | ICD-10-CM | POA: Diagnosis present

## 2019-09-28 DIAGNOSIS — T45515A Adverse effect of anticoagulants, initial encounter: Secondary | ICD-10-CM | POA: Diagnosis present

## 2019-09-28 DIAGNOSIS — N1832 Chronic kidney disease, stage 3b: Secondary | ICD-10-CM | POA: Diagnosis present

## 2019-09-28 LAB — HIV ANTIBODY (ROUTINE TESTING W REFLEX): HIV Screen 4th Generation wRfx: NONREACTIVE

## 2019-09-28 LAB — PROTIME-INR
INR: 2.1 — ABNORMAL HIGH (ref 0.8–1.2)
Prothrombin Time: 22.7 seconds — ABNORMAL HIGH (ref 11.4–15.2)

## 2019-09-28 LAB — LACTIC ACID, PLASMA: Lactic Acid, Venous: 2.7 mmol/L (ref 0.5–1.9)

## 2019-09-28 LAB — SARS CORONAVIRUS 2 BY RT PCR (HOSPITAL ORDER, PERFORMED IN ~~LOC~~ HOSPITAL LAB): SARS Coronavirus 2: NEGATIVE

## 2019-09-28 LAB — TYPE AND SCREEN
ABO/RH(D): B POS
Antibody Screen: NEGATIVE

## 2019-09-28 LAB — HEMOGLOBIN AND HEMATOCRIT, BLOOD
HCT: 30.1 % — ABNORMAL LOW (ref 39.0–52.0)
Hemoglobin: 10.5 g/dL — ABNORMAL LOW (ref 13.0–17.0)

## 2019-09-28 MED ORDER — ACETAMINOPHEN 650 MG RE SUPP: 650.0000 mg | Freq: Four times a day (QID) | RECTAL | Status: DC | PRN

## 2019-09-28 MED ORDER — EZETIMIBE 10 MG PO TABS
10.0000 mg | ORAL_TABLET | Freq: Every day | ORAL | Status: DC
  Administered 2019-09-28 – 2019-09-30 (×3): 10 mg via ORAL
  Filled 2019-09-28 (×3): qty 1

## 2019-09-28 MED ORDER — TEMAZEPAM 15 MG PO CAPS: 15.0000 mg | ORAL_CAPSULE | Freq: Every evening | ORAL | Status: DC | PRN

## 2019-09-28 MED ORDER — SODIUM CHLORIDE 0.9 % IV SOLN: INTRAVENOUS | Status: DC

## 2019-09-28 MED ORDER — ACETAMINOPHEN 325 MG PO TABS: 650.0000 mg | ORAL_TABLET | Freq: Four times a day (QID) | ORAL | Status: DC | PRN

## 2019-09-28 MED ORDER — AMLODIPINE BESYLATE 10 MG PO TABS
10.0000 mg | ORAL_TABLET | Freq: Every day | ORAL | Status: DC
  Administered 2019-09-28 – 2019-09-30 (×3): 10 mg via ORAL
  Filled 2019-09-28 (×3): qty 1

## 2019-09-28 MED ORDER — SODIUM CHLORIDE 0.9 % IV SOLN
80.0000 mg | Freq: Once | INTRAVENOUS | Status: AC
  Administered 2019-09-28: 03:00:00 80 mg via INTRAVENOUS
  Filled 2019-09-28: qty 80

## 2019-09-28 MED ORDER — ROSUVASTATIN CALCIUM 20 MG PO TABS
40.0000 mg | ORAL_TABLET | Freq: Every day | ORAL | Status: DC
  Administered 2019-09-28 – 2019-09-30 (×3): 40 mg via ORAL
  Filled 2019-09-28 (×3): qty 2

## 2019-09-28 MED ORDER — PANTOPRAZOLE SODIUM 40 MG IV SOLR
40.0000 mg | Freq: Two times a day (BID) | INTRAVENOUS | Status: DC
  Administered 2019-09-28 – 2019-09-29 (×3): 40 mg via INTRAVENOUS
  Filled 2019-09-28 (×3): qty 40

## 2019-09-28 MED ORDER — ZOLPIDEM TARTRATE 5 MG PO TABS
5.0000 mg | ORAL_TABLET | Freq: Every evening | ORAL | Status: DC | PRN
  Administered 2019-09-28 – 2019-09-29 (×2): 5 mg via ORAL
  Filled 2019-09-28 (×2): qty 1

## 2019-09-28 MED ORDER — SODIUM CHLORIDE 0.9 % IV SOLN
8.0000 mg/h | INTRAVENOUS | Status: DC
  Administered 2019-09-28: 04:00:00 8 mg/h via INTRAVENOUS
  Filled 2019-09-28 (×2): qty 80

## 2019-09-28 MED ORDER — BOOST / RESOURCE BREEZE PO LIQD CUSTOM
1.0000 | Freq: Three times a day (TID) | ORAL | Status: DC
  Administered 2019-09-28 – 2019-09-30 (×4): 1 via ORAL

## 2019-09-28 MED ORDER — VITAMIN K1 10 MG/ML IJ SOLN
5.0000 mg | Freq: Once | INTRAVENOUS | Status: AC
  Administered 2019-09-28: 5 mg via INTRAVENOUS
  Filled 2019-09-28: qty 0.5

## 2019-09-28 MED ORDER — PANTOPRAZOLE SODIUM 40 MG IV SOLR: 40.0000 mg | Freq: Two times a day (BID) | INTRAVENOUS | Status: DC

## 2019-09-28 NOTE — Progress Notes (Addendum)
Patient is admitted this morning , details please defer to HPI . 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  AKI likely due to volume depletion in the setting of gi bleed, will check ua, start hydration, repeat bmp in am  Of note, patient is interested in eliuqis, can discuss when able to resume anticoagulation.  Wife at bedside updated.

## 2019-09-28 NOTE — Progress Notes (Signed)
INR was 2.1. We are going to give him a little more Vit K and are planning on EGD tomorrow.

## 2019-09-28 NOTE — ED Provider Notes (Signed)
[facility] DEPT Provider Note   CSN: [id number] Arrival date & time: 09/27/19  1801     History Chief Complaint  Patient presents with  . Dizziness    Charles [name] is a 69 y.o. male.  Presents to ER with concern for dizziness.  Patient reports he has been having lightheadedness, unsteadiness since yesterday evening.  This morning also had a dark tarry bowel movement.  Also has some nausea.  No abdominal pain, no vomiting.  No recent changes in medications, no recent changes in diet.  Takes Coumadin for pulmonary embolism.  No numbness, weakness, speech changes, vision changes.  HPI     Past Medical History:  Diagnosis Date  . ABNORMAL ELECTROCARDIOGRAM 11/02/2008  . ABSCESS, LUNG 10/23/2006  . Anemia 05/26/2014  . BACTERIAL PNEUMONIA 12/28/2009  . BPH (benign prostatic hyperplasia) 11/22/2010  . Cerebellar stroke ([place]) 05/26/2014  . CHEST PAIN 12/24/2009  . Coronary artery calcification seen on CT scan 06/01/2015  . Cramp of limb 07/19/2007  . DIVERTICULOSIS, COLON 03/07/2007  . DVT (deep venous thrombosis) ([place]) 01/2014   LLE  . Emphysema of lung ([place])   . 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/23/2006  . FLANK PAIN, LEFT 02/25/2010  . FREQUENCY, URINARY 12/24/2009  . HEMORRHOIDS 10/23/2006  . HYPERLIPIDEMIA 10/23/2006  . HYPERTENSION 10/23/2006  . PE (pulmonary embolism) 02/2014  . PLANTAR FASCIITIS, LEFT 11/02/2008  . Pneumonia 12/2013 X 2  . PSA, INCREASED 11/20/2007  . PULMONARY NODULE 05/14/2008  . Unspecified Peripheral Vascular Disease 10/23/2006    Patient Active Problem List   Diagnosis Date Noted  . Hypokalemia 09/06/2019  . Dysphagia 09/05/2019  . Multiple contusions 12/11/2018  . Vitamin D deficiency 12/11/2018  . History of DVT (deep vein thrombosis) 11/25/2018  . Primary osteoarthritis of left knee 02/01/2018  . Insomnia 07/13/2017  . Long term (current) use of anticoagulants 07/11/2017  . ATN (acute tubular necrosis) ([place])   . Melena   . Balance  problems 06/13/2017  . Acute hearing loss, right 06/13/2017  . Dehydration 06/13/2017  . Symptomatic anemia 06/13/2017  . Hx of pulmonary embolus 06/13/2017  . Thrombocytopenia ([place]) 06/13/2017  . Alcohol abuse 06/13/2017  . Acute renal failure (ARF) ([place]) 06/13/2017  . Ear pain, bilateral 08/24/2016  . Lung blebs ([place]) 07/31/2016  . Acute DVT (deep venous thrombosis) ([place]) 07/25/2016  . Pneumothorax on right 07/23/2016  . PE (pulmonary thromboembolism) ([place]) 07/23/2016  . CKD (chronic kidney disease), stage III 07/23/2016  . Gross hematuria 04/25/2016  . UTI (urinary tract infection) 08/26/2015  . Bilateral hearing loss 08/26/2015  . Coronary artery calcification seen on CT scan 06/01/2015  . Low back pain 06/01/2015  . Hyperglycemia 12/18/2014  . Anemia 05/26/2014  . Cerebellar stroke ([place]) 05/26/2014  . Brain contusion ([place]) 04/23/2014  . Lung nodule 03/09/2014  . Acute pulmonary embolism ([place]) 02/18/2014  . Multiple nodules of lung 02/18/2014  . Cough 12/24/2013  . Allergic rhinitis 12/24/2013  . Other chest pain 12/24/2013  . Increased prostate specific antigen (PSA) velocity 11/20/2013  . Eustachian tube dysfunction 11/20/2013  . Hearing loss on right 11/20/2013  . Serous otitis media 11/15/2013  . Muscle tightness 10/02/2013  . Rotator cuff injury 09/17/2013  . Skin lesion 07/25/2013  . Anal pain 12/18/2012  . Habitual alcohol use 11/17/2012  . Cellulitis and abscess of buttock 11/15/2012  . Cluster headaches 06/07/2012  . BPH (benign prostatic hyperplasia) 11/22/2010  . Abnormal LFTs 11/14/2010  . Right inguinal hernia 09/16/2010  . Nocturia  08/29/2010  . Preventative health care 08/28/2010  . FREQUENCY, URINARY 12/24/2009  . COPD (chronic obstructive pulmonary disease) ([place]) 05/12/2009  . ABNORMAL ELECTROCARDIOGRAM 11/02/2008  . Solitary pulmonary nodule 05/14/2008  . Cramp of limb 07/19/2007  . Diverticulosis of colon 03/07/2007  . Hyperlipidemia  10/23/2006  . 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/23/2006  . Essential hypertension 10/23/2006  . Unspecified Peripheral Vascular Disease 10/23/2006  . Hemorrhoids 10/23/2006  . ABSCESS, LUNG 10/23/2006  . GERD 10/23/2006    Past Surgical History:  Procedure Laterality Date  . ESOPHAGOGASTRODUODENOSCOPY (EGD) WITH PROPOFOL N/A 06/15/2017   Procedure: ESOPHAGOGASTRODUODENOSCOPY (EGD) WITH PROPOFOL;  Surgeon: Jerene Bears, MD;  Location: [facility];  Service: Gastroenterology;  Laterality: N/A;  . HEMORRHOID SURGERY  ?1990's  . INGUINAL HERNIA REPAIR Bilateral ?2000's  . IVC FILTER INSERTION N/A 10/03/2016   Procedure: IVC Filter Retrieveal;  Surgeon: Serafina Mitchell, MD;  Location: [facility];  Service: Cardiovascular;  Laterality: N/A;  . MYRINGOTOMY WITH TUBE PLACEMENT Right 06/26/2018  . SHOULDER ARTHROSCOPY W/ ROTATOR CUFF REPAIR Right 11/2013  . STAPLING OF BLEBS Right 07/31/2016   Procedure: BLEBECTOMY;  Surgeon: Melrose Nakayama, MD;  Location: [facility];  Service: Thoracic;  Laterality: Right;  . VENA CAVA FILTER PLACEMENT Right 08/04/2016   Procedure: INSERTION VENA-CAVA FILTER;  Surgeon: Melrose Nakayama, MD;  Location: [facility];  Service: Thoracic;  Laterality: Right;  Marland Kitchen VIDEO ASSISTED THORACOSCOPY Right 07/31/2016   Procedure: VIDEO ASSISTED THORACOSCOPY;  Surgeon: Melrose Nakayama, MD;  Location: [facility];  Service: Thoracic;  Laterality: Right;  Marland Kitchen VIDEO ASSISTED THORACOSCOPY Right 08/04/2016   Procedure: REDO VIDEO ASSISTED THORACOSCOPY- EVACUATION OF HEMOTHORAX;  Surgeon: Melrose Nakayama, MD;  Location: [facility];  Service: Thoracic;  Laterality: Right;  Marland Kitchen VIDEO BRONCHOSCOPY WITH ENDOBRONCHIAL NAVIGATION N/A 03/11/2014   Procedure: VIDEO BRONCHOSCOPY WITH ENDOBRONCHIAL NAVIGATION;  Surgeon: Collene Gobble, MD;  Location: [facility];  Service: Thoracic;  Laterality: N/A;       Family History  Problem Relation Age of Onset  . Heart disease Mother   . Heart disease Father     . Kidney disease Sister        Renal transplant  . Colon cancer Neg Hx   . Rectal cancer Neg Hx     Social History   Tobacco Use  . Smoking status: Former Smoker    Packs/day: 1.00    Years: 15.00    Pack years: 15.00    Types: Cigarettes  . Smokeless tobacco: Never Used  . Tobacco comment: "quit smoking cigarettes in the 1990's"  Vaping Use  . Vaping Use: Never used  Substance Use Topics  . Alcohol use: Yes    Alcohol/week: 0.0 standard drinks    Comment: patient states he stopped 4-5 days ago  . Drug use: Yes    Types: Marijuana    Comment: daily use /yesterday    Home Medications Prior to Admission medications   Medication Sig Start Date End Date Taking? Authorizing Provider  amLODipine (NORVASC) 10 MG tablet Take 1 tablet (10 mg total) by mouth daily. 08/26/19  Yes Biagio Borg, MD  Cyanocobalamin (VITAMIN B12 PO) Take 1 tablet by mouth daily.   Yes [provider]  ezetimibe (ZETIA) 10 MG tablet Take 1 tablet (10 mg total) by mouth daily. Annual appt due in Oct must see provider for future refills Patient taking differently: Take 10 mg by mouth daily.  08/26/19  Yes Biagio Borg, MD  pantoprazole ([place])  40 MG tablet Take 1 tablet (40 mg total) by mouth 2 (two) times daily. Patient taking differently: Take 40 mg by mouth daily as needed (heartburn).  09/05/19  Yes Biagio Borg, MD  rosuvastatin (CRESTOR) 40 MG tablet Take 1 tablet by mouth once daily 09/23/19  Yes Biagio Borg, MD  tadalafil (CIALIS) 20 MG tablet TAKE 1 TABLET BY MOUTH ONCE DAILY AS NEEDED FOR ERECTILE DYSFUNCTION Patient taking differently: Take 20 mg by mouth daily as needed for erectile dysfunction.  07/17/19  Yes Biagio Borg, MD  temazepam (RESTORIL) 15 MG capsule TAKE 1 TO 2 CAPSULES BY MOUTH AT BEDTIME AS NEEDED FOR SLEEP Patient taking differently: Take 15-30 mg by mouth at bedtime as needed for sleep.  07/17/19  Yes Biagio Borg, MD  warfarin (COUMADIN) 5 MG tablet Take 5-7.5 mg by  mouth See admin instructions. Sunday, Tuesday, Wednesday, Friday and Saturday take 5 mg AND Mondays and Thursdays take 7.5 mg 09/08/19  Yes [provider]  albuterol (VENTOLIN HFA) 108 (90 Base) MCG/ACT inhaler Inhale 2 puffs into the lungs every 6 (six) hours as needed for wheezing or shortness of breath. Patient not taking: Reported on 09/27/2019 08/26/19   Biagio Borg, MD  apixaban (ELIQUIS) 5 MG TABS tablet Take 1 tablet (5 mg total) by mouth 2 (two) times daily. Patient not taking: Reported on 09/27/2019 08/26/19   Biagio Borg, MD    Allergies    Patient has no known allergies.  Review of Systems   Review of Systems  Constitutional: Negative for chills and fever.  HENT: Negative for ear pain and sore throat.   Eyes: Negative for pain and visual disturbance.  Respiratory: Negative for cough and shortness of breath.   Cardiovascular: Negative for chest pain and palpitations.  Gastrointestinal: Positive for blood in stool, diarrhea and nausea. Negative for abdominal pain and vomiting.  Genitourinary: Negative for dysuria and hematuria.  Musculoskeletal: Negative for arthralgias and back pain.  Skin: Negative for color change and rash.  Neurological: Negative for seizures and syncope.  All other systems reviewed and are negative.   Physical Exam Updated Vital Signs BP 139/73   Pulse (!) 116   Temp 97.9 F (36.6 C) (Oral)   Resp (!) 26   SpO2 92%   Physical Exam Vitals and nursing note reviewed.  Constitutional:      Appearance: He is well-developed.  HENT:     Head: Normocephalic and atraumatic.  Eyes:     Conjunctiva/sclera: Conjunctivae normal.  Cardiovascular:     Rate and Rhythm: Normal rate and regular rhythm.     Heart sounds: No murmur heard.   Pulmonary:     Effort: Pulmonary effort is normal. No respiratory distress.     Breath sounds: Normal breath sounds.  Abdominal:     Palpations: Abdomen is soft.     Tenderness: There is no abdominal tenderness.    Genitourinary:    Rectum: Normal.     Comments: Stool brown, heme occult positive Musculoskeletal:        General: No deformity or signs of injury.     Cervical back: Neck supple.  Skin:    General: Skin is warm and dry.     Capillary Refill: Capillary refill takes less than 2 seconds.  Neurological:     Mental Status: He is alert.     Comments: AAOx3 CN 2-12 intact, speech clear visual fields intact 5/5 strength in b/l UE and LE Sensation to  light touch intact in b/l UE and LE Normal FNF   Psychiatric:        Mood and Affect: Mood normal.     ED Results / Procedures / Treatments   Labs (all labs ordered are listed, but only abnormal results are displayed) Labs Reviewed  BASIC METABOLIC PANEL - Abnormal; Notable for the following components:      Result Value   Sodium 132 (*)    CO2 15 (*)    Glucose, Bld 206 (*)    BUN 57 (*)    Creatinine, Ser 3.98 (*)    Calcium 7.4 (*)    GFR calc non Af Amer 15 (*)    GFR calc Af Amer 17 (*)    Anion gap 19 (*)    All other components within normal limits  CBC - Abnormal; Notable for the following components:   WBC 12.3 (*)    RBC 3.42 (*)    Hemoglobin 10.8 (*)    HCT 30.6 (*)    nRBC 1.9 (*)    All other components within normal limits  PROTIME-INR - Abnormal; Notable for the following components:   Prothrombin Time 74.1 (*)    INR 9.5 (*)    All other components within normal limits  LACTIC ACID, PLASMA - Abnormal; Notable for the following components:   Lactic Acid, Venous 3.1 (*)    All other components within normal limits  BLOOD GAS, VENOUS - Abnormal; Notable for the following components:   pCO2, Ven 25.2 (*)    pO2, Ven 104.0 (*)    Bicarbonate 16.0 (*)    Acid-base deficit 6.9 (*)    All other components within normal limits  BRAIN NATRIURETIC PEPTIDE - Abnormal; Notable for the following components:   B Natriuretic Peptide 117.3 (*)    All other components within normal limits  POC OCCULT BLOOD, ED -  Abnormal; Notable for the following components:   Fecal Occult Bld POSITIVE (*)    All other components within normal limits  TROPONIN I (HIGH SENSITIVITY) - Abnormal; Notable for the following components:   Troponin I (High Sensitivity) 48 (*)    All other components within normal limits  TROPONIN I (HIGH SENSITIVITY) - Abnormal; Notable for the following components:   Troponin I (High Sensitivity) 53 (*)    All other components within normal limits  MAGNESIUM  LACTIC ACID, PLASMA  TYPE AND SCREEN    EKG EKG Interpretation  Date/Time:  Saturday September 27 2019 21:20:22 EDT Ventricular Rate:  96 PR Interval:    QRS Duration: 107 QT Interval:  358 QTC Calculation: 453 R Axis:   25 Text Interpretation: Sinus rhythm Low voltage, precordial leads ST elevation, consider inferior injury no acute STEMI Confirmed by Madalyn Rob [id number]) on 09/27/2019 10:15:39 PM   Radiology DG Chest 2 View  Result Date: 09/27/2019 CLINICAL DATA:  Shortness of breath EXAM: CHEST - 2 VIEW COMPARISON:  06/13/2017 FINDINGS: The heart size and mediastinal contours are within normal limits. Both lungs are clear. Disc degenerative disease of the thoracic spine. IMPRESSION: No acute abnormality of the lungs. Electronically Signed   By: Eddie Candle M.D.   On: 09/27/2019 20:05   CT Head Wo Contrast  Result Date: 09/27/2019 CLINICAL DATA:  Dizziness EXAM: CT HEAD WITHOUT CONTRAST TECHNIQUE: Contiguous axial images were obtained from the base of the skull through the vertex without intravenous contrast. COMPARISON:  05/13/2014 FINDINGS: Brain: Old infarct in the right cerebellum. There is atrophy and chronic  small vessel disease changes. No acute intracranial abnormality. Specifically, no hemorrhage, hydrocephalus, mass lesion, acute infarction, or significant intracranial injury. Vascular: No hyperdense vessel or unexpected calcification. Skull: No acute calvarial abnormality. Sinuses/Orbits: Visualized paranasal  sinuses and mastoids clear. Orbital soft tissues unremarkable. Other: None IMPRESSION: Old right cerebellar infarct. Atrophy, chronic microvascular disease. No acute intracranial abnormality. Electronically Signed   By: Rolm Baptise M.D.   On: 09/27/2019 20:29    Procedures .Critical Care Performed by: Lucrezia Starch, MD Authorized by: Lucrezia Starch, MD   Critical care provider statement:    Critical care time (minutes):  41   Critical care was necessary to treat or prevent imminent or life-threatening deterioration of the following conditions: GI bleed.   Critical care was time spent personally by me on the following activities:  Discussions with consultants, evaluation of patient's response to treatment, examination of patient, ordering and performing treatments and interventions, ordering and review of laboratory studies, ordering and review of radiographic studies, pulse oximetry, re-evaluation of patient's condition, obtaining history from patient or surrogate and review of old charts   (including critical care time)  Medications Ordered in ED Medications  sodium chloride 0.9 % bolus 1,000 mL (0 mLs Intravenous Stopped 09/27/19 2027)  meclizine (ANTIVERT) tablet 25 mg (25 mg Oral Given 09/27/19 2027)  phytonadione (VITAMIN K) 10 mg in dextrose 5 % 50 mL IVPB (0 mg Intravenous Stopped 09/28/19 0022)  sodium chloride 0.9 % bolus 1,000 mL (0 mLs Intravenous Stopped 09/28/19 0022)    ED Course  I have reviewed the triage vital signs and the nursing notes.  Pertinent labs & imaging results that were available during my care of the patient were reviewed by me and considered in my medical decision making (see chart for details).    MDM Rules/Calculators/A&P                          69 year old male history of DVT/PE on Coumadin presenting to ER with dizziness/lightheadedness and dark stools.  On exam, patient well-appearing in no distress but noted to be mildly tachycardic.  Stool is  brown but heme positive.  Lab work was concerning for INR of greater than 9, bicarb 15, lactate 3, elevated creatinine, elevated BUN, slight drop in hemoglobin.  Concern for GI bleed in setting of very elevated INR.  Reviewed with pharmacy on-call, she recommended 10 of vitamin K IV.  Provided fluids.  Discussed case with [facility] on-call, he recommended trending labs, improving INR, do not anticipate any acute intervention at this time, their team will see in am.  Consulted hospitalist for admission.  Wife at bedside and patient updated throughout stay.   Final Clinical Impression(s) / ED Diagnoses Final diagnoses:  Gastrointestinal hemorrhage, unspecified gastrointestinal hemorrhage type  Supratherapeutic INR  Acute renal failure, unspecified acute renal failure type (HCC)  Elevated lactic acid level    Rx / DC Orders ED Discharge Orders    None       Lucrezia Starch, MD 09/28/19 [phone]

## 2019-09-28 NOTE — H&P (View-Only) (Signed)
INR was 2.1. We are going to give him a little more Vit K and are planning on EGD tomorrow.

## 2019-09-28 NOTE — H&P (Signed)
History and Physical    Charles [name] UUV:[id number] DOB: 1950/09/27 DOA: 09/27/2019  PCP: Biagio Borg, MD (Confirm with patient/family/NH records and if not entered, this has to be entered at [facility] point of entry) Patient coming from: home  I have personally briefly reviewed patient's old medical records in [place]  Chief Complaint: dizzy, weak, dark stool  HPI: Charles [name] is a 69 y.o. male with medical history significant of h/o CVA, h/o PE, emphysema, HLD, HTN presents to [facility] for evaluation of dizziness, weakness and due to having had a very dark stool earlier in the day. He takes coumadin due to h/o PE And CVA.   ED Course: Afebrile, VSS. Lab reveals Hgb 10.8, normally 13-14 g, INR 9.5, heme positive stool. ED-P consulted on-call [facility] who recommended reversing coumadin and have medicine admit patient with consult to follow in AM. [facility] called to admit the patient.  Review of Systems: As per HPI otherwise 10 point review of systems negative. He gives a h/o mild dysphagia - dx'd as functional by his PCP .    Past Medical History:  Diagnosis Date  . ABNORMAL ELECTROCARDIOGRAM 11/02/2008  . ABSCESS, LUNG 10/23/2006  . Anemia 05/26/2014  . BACTERIAL PNEUMONIA 12/28/2009  . BPH (benign prostatic hyperplasia) 11/22/2010  . Cerebellar stroke ([place]) 05/26/2014  . CHEST PAIN 12/24/2009  . Coronary artery calcification seen on CT scan 06/01/2015  . Cramp of limb 07/19/2007  . DIVERTICULOSIS, COLON 03/07/2007  . DVT (deep venous thrombosis) ([place]) 01/2014   LLE  . Emphysema of lung ([place])   . 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/23/2006  . FLANK PAIN, LEFT 02/25/2010  . FREQUENCY, URINARY 12/24/2009  . HEMORRHOIDS 10/23/2006  . HYPERLIPIDEMIA 10/23/2006  . HYPERTENSION 10/23/2006  . PE (pulmonary embolism) 02/2014  . PLANTAR FASCIITIS, LEFT 11/02/2008  . Pneumonia 12/2013 X 2  . PSA, INCREASED 11/20/2007  . PULMONARY NODULE 05/14/2008  . Unspecified Peripheral Vascular Disease 10/23/2006    Past  Surgical History:  Procedure Laterality Date  . ESOPHAGOGASTRODUODENOSCOPY (EGD) WITH PROPOFOL N/A 06/15/2017   Procedure: ESOPHAGOGASTRODUODENOSCOPY (EGD) WITH PROPOFOL;  Surgeon: Jerene Bears, MD;  Location: [facility];  Service: Gastroenterology;  Laterality: N/A;  . HEMORRHOID SURGERY  ?1990's  . INGUINAL HERNIA REPAIR Bilateral ?2000's  . IVC FILTER INSERTION N/A 10/03/2016   Procedure: IVC Filter Retrieveal;  Surgeon: Serafina Mitchell, MD;  Location: [place] [facility];  Service: Cardiovascular;  Laterality: N/A;  . MYRINGOTOMY WITH TUBE PLACEMENT Right 06/26/2018  . SHOULDER ARTHROSCOPY W/ ROTATOR CUFF REPAIR Right 11/2013  . STAPLING OF BLEBS Right 07/31/2016   Procedure: BLEBECTOMY;  Surgeon: Melrose Nakayama, MD;  Location: [place];  Service: Thoracic;  Laterality: Right;  . VENA CAVA FILTER PLACEMENT Right 08/04/2016   Procedure: INSERTION VENA-CAVA FILTER;  Surgeon: Melrose Nakayama, MD;  Location: [place];  Service: Thoracic;  Laterality: Right;  Marland Kitchen VIDEO ASSISTED THORACOSCOPY Right 07/31/2016   Procedure: VIDEO ASSISTED THORACOSCOPY;  Surgeon: Melrose Nakayama, MD;  Location: [place];  Service: Thoracic;  Laterality: Right;  Marland Kitchen VIDEO ASSISTED THORACOSCOPY Right 08/04/2016   Procedure: REDO VIDEO ASSISTED THORACOSCOPY- EVACUATION OF HEMOTHORAX;  Surgeon: Melrose Nakayama, MD;  Location: [place];  Service: Thoracic;  Laterality: Right;  Marland Kitchen VIDEO BRONCHOSCOPY WITH ENDOBRONCHIAL NAVIGATION N/A 03/11/2014   Procedure: VIDEO BRONCHOSCOPY WITH ENDOBRONCHIAL NAVIGATION;  Surgeon: Collene Gobble, MD;  Location: [facility];  Service: Thoracic;  Laterality: N/A;    Soc Hx  - married, 2  grown children. 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 He then worked for the [employer] until retirement at age 40. He lives with his wife. They are I-ADLs.    reports that he has quit smoking. His smoking use included cigarettes. He has a 15.00  pack-year smoking history. He has never used smokeless tobacco. He reports current alcohol use. He reports current drug use. Drug: Marijuana.  No Known Allergies  Family History  Problem Relation Age of Onset  . Heart disease Mother   . Heart disease Father   . Kidney disease Sister        Renal transplant  . Colon cancer Neg Hx   . Rectal cancer Neg Hx      Prior to Admission medications   Medication Sig Start Date End Date Taking? Authorizing Provider  amLODipine (NORVASC) 10 MG tablet Take 1 tablet (10 mg total) by mouth daily. 08/26/19  Yes Biagio Borg, MD  Cyanocobalamin (VITAMIN B12 PO) Take 1 tablet by mouth daily.   Yes [provider]  ezetimibe (ZETIA) 10 MG tablet Take 1 tablet (10 mg total) by mouth daily. Annual appt due in Oct must see provider for future refills Patient taking differently: Take 10 mg by mouth daily.  08/26/19  Yes Biagio Borg, MD  pantoprazole (PROTONIX) 40 MG tablet Take 1 tablet (40 mg total) by mouth 2 (two) times daily. Patient taking differently: Take 40 mg by mouth daily as needed (heartburn).  09/05/19  Yes Biagio Borg, MD  rosuvastatin (CRESTOR) 40 MG tablet Take 1 tablet by mouth once daily 09/23/19  Yes Biagio Borg, MD  tadalafil (CIALIS) 20 MG tablet TAKE 1 TABLET BY MOUTH ONCE DAILY AS NEEDED FOR ERECTILE DYSFUNCTION Patient taking differently: Take 20 mg by mouth daily as needed for erectile dysfunction.  07/17/19  Yes Biagio Borg, MD  temazepam (RESTORIL) 15 MG capsule TAKE 1 TO 2 CAPSULES BY MOUTH AT BEDTIME AS NEEDED FOR SLEEP Patient taking differently: Take 15-30 mg by mouth at bedtime as needed for sleep.  07/17/19  Yes Biagio Borg, MD  warfarin (COUMADIN) 5 MG tablet Take 5-7.5 mg by mouth See admin instructions. Sunday, Tuesday, Wednesday, Friday and Saturday take 5 mg AND Mondays and Thursdays take 7.5 mg 09/08/19  Yes [provider]  albuterol (VENTOLIN HFA) 108 (90 Base) MCG/ACT inhaler Inhale 2 puffs into the  lungs every 6 (six) hours as needed for wheezing or shortness of breath. Patient not taking: Reported on 09/27/2019 08/26/19   Biagio Borg, MD  apixaban (ELIQUIS) 5 MG TABS tablet Take 1 tablet (5 mg total) by mouth 2 (two) times daily. Patient not taking: Reported on 09/27/2019 08/26/19   Biagio Borg, MD    Physical Exam: Vitals:   09/27/19 1851 09/27/19 2051 09/27/19 2115  BP: 132/80 118/68 139/73  Pulse: (!) 112 (!) 122 (!) 116  Resp: (!) 22 (!) 33 (!) 26  Temp: 97.9 F (36.6 C)    TempSrc: Oral    SpO2: 99% 96% 92%    Constitutional: NAD, calm, comfortable Vitals:   09/27/19 1851 09/27/19 2051 09/27/19 2115  BP: 132/80 118/68 139/73  Pulse: (!) 112 (!) 122 (!) 116  Resp: (!) 22 (!) 33 (!) 26  Temp: 97.9 F (36.6 C)    TempSrc: Oral    SpO2: 99% 96% 92%   General: WNWD man in no distress Eyes: PERRL,  lids and conjunctivae normal. Lid lag OS - a chronic condition ENMT: Mucous membranes are moist. Posterior pharynx clear of any exudate or lesions.Normal dentition. Mild gingival hyperplasia Neck: normal, supple, no masses, no thyromegaly Respiratory: clear to auscultation bilaterally, no wheezing, no crackles. Normal respiratory effort. No accessory muscle use.  Cardiovascular: Regular rate and rhythm, no murmurs / rubs / gallops. No extremity edema. 2+ pedal pulses. No carotid bruits.  Abdomen:  Tender to deep palpation epigastrium, no masses palpated. No hepatosplenomegaly. Bowel sounds positive.  Musculoskeletal: no clubbing / cyanosis. No joint deformity upper and lower extremities. Good ROM, no contractures. Normal muscle tone.  Skin: no rashes, lesions, ulcers. No induration. Chronic darkened patches distal lower extremity Neurologic: CN 2-12 grossly intact. Sensation intact, DTR normal. Strength 5/5 in all 4.  Psychiatric: Normal judgment and insight. Alert and oriented x 3. Normal mood.    Labs on Admission: I have personally reviewed following labs and imaging  studies  CBC: Recent Labs  Lab 09/27/19 1935  WBC 12.3*  HGB 10.8*  HCT 30.6*  MCV 89.5  PLT 476   Basic Metabolic Panel: Recent Labs  Lab 09/27/19 1935 09/27/19 2059  NA 132*  --   K 4.4  --   CL 98  --   CO2 15*  --   GLUCOSE 206*  --   BUN 57*  --   CREATININE 3.98*  --   CALCIUM 7.4*  --   MG  --  2.0   GFR: CrCl cannot be calculated (Unknown ideal weight.). Liver Function Tests: No results for input(s): AST, ALT, ALKPHOS, BILITOT, PROT, ALBUMIN in the last 168 hours. No results for input(s): LIPASE, AMYLASE in the last 168 hours. No results for input(s): AMMONIA in the last 168 hours. Coagulation Profile: Recent Labs  Lab 09/27/19 1935  INR 9.5*   Cardiac Enzymes: No results for input(s): CKTOTAL, CKMB, CKMBINDEX, TROPONINI in the last 168 hours. BNP (last 3 results) No results for input(s): PROBNP in the last 8760 hours. HbA1C: No results for input(s): HGBA1C in the last 72 hours. CBG: No results for input(s): GLUCAP in the last 168 hours. Lipid Profile: No results for input(s): CHOL, HDL, LDLCALC, TRIG, CHOLHDL, LDLDIRECT in the last 72 hours. Thyroid Function Tests: No results for input(s): TSH, T4TOTAL, FREET4, T3FREE, THYROIDAB in the last 72 hours. Anemia Panel: No results for input(s): VITAMINB12, FOLATE, FERRITIN, TIBC, IRON, RETICCTPCT in the last 72 hours. Urine analysis:    Component Value Date/Time   COLORURINE YELLOW 08/26/2019 1015   APPEARANCEUR CLEAR 08/26/2019 1015   LABSPEC 1.015 08/26/2019 1015   PHURINE 6.0 08/26/2019 1015   GLUCOSEU NEGATIVE 08/26/2019 1015   HGBUR TRACE-INTACT (A) 08/26/2019 1015   BILIRUBINUR NEGATIVE 08/26/2019 1015   BILIRUBINUR negative 08/26/2015 0858   KETONESUR NEGATIVE 08/26/2019 1015   PROTEINUR NEGATIVE 06/28/2017 1245   UROBILINOGEN 0.2 08/26/2019 1015   NITRITE NEGATIVE 08/26/2019 1015   LEUKOCYTESUR NEGATIVE 08/26/2019 1015    Radiological Exams on Admission: DG Chest 2 View  Result  Date: 09/27/2019 CLINICAL DATA:  Shortness of breath EXAM: CHEST - 2 VIEW COMPARISON:  06/13/2017 FINDINGS: The heart size and mediastinal contours are within normal limits. Both lungs are clear. Disc degenerative disease of the thoracic spine. IMPRESSION: No acute abnormality of the lungs. Electronically Signed   By: Eddie Candle M.D.   On: 09/27/2019 20:05   CT Head Wo Contrast  Result Date: 09/27/2019 CLINICAL DATA:  Dizziness EXAM: CT HEAD WITHOUT CONTRAST TECHNIQUE: Contiguous  axial images were obtained from the base of the skull through the vertex without intravenous contrast. COMPARISON:  05/13/2014 FINDINGS: Brain: Old infarct in the right cerebellum. There is atrophy and chronic small vessel disease changes. No acute intracranial abnormality. Specifically, no hemorrhage, hydrocephalus, mass lesion, acute infarction, or significant intracranial injury. Vascular: No hyperdense vessel or unexpected calcification. Skull: No acute calvarial abnormality. Sinuses/Orbits: Visualized paranasal sinuses and mastoids clear. Orbital soft tissues unremarkable. Other: None IMPRESSION: Old right cerebellar infarct. Atrophy, chronic microvascular disease. No acute intracranial abnormality. Electronically Signed   By: Rolm Baptise M.D.   On: 09/27/2019 20:29    EKG: Independently reviewed. Sinus tachycardia w/o acute changes  Assessment/Plan Active Problems:   Hyperlipidemia   Essential hypertension   GERD   COPD (chronic obstructive pulmonary disease) (HCC)   BPH (benign prostatic hyperplasia)   Coagulopathy (HCC)  (please populate well all problems here in Problem List. (For example, if patient is on BP meds at home and you resume or decide to hold them, it is a problem that needs to be her. Same for CAD, COPD, HLD and so on)   1. Coagulopathy/GI - patient with INR 9.5, report of melanotic stool and heme positive stool on exam. Suspect UGI bleed exacerbated by coagulopathy. Plan Vit K 10 mg administered  in ED  Hold coumadin  F/u INR later this AM  2. GI - h/o GERD. Recent c/o dysphagia suggesting possible reflux. Tender on exam at the episgastrium. Concern for UGI bleed as above Plan Protonix bolus and infusion  Serial H/H  GI consult - may come to EGD when INR stable  3. HTN - continue home meds  4. HLD - continue home meds  5. Dispo - home when medically stable  DVT prophylaxis: TED hose  Code Status: full code  Family Communication: patient asked that his wife not be disturbed at this hour. Disposition Plan: home when stable (specify when and where you expect patient to be discharged) Consults called: GI - [place] on call was contacted by EDP, will round/consult in AM  Admission status: Inpatient    Adella Hare MD [facility] Pager [phone]  If 7PM-7AM, please contact night-coverage [email or web address] Password [email or web address]  09/28/2019, 1:18 AM

## 2019-09-28 NOTE — Consult Note (Addendum)
Consultation  Referring Provider: Dr. Erlinda Hong     Primary Care Physician:  Biagio Borg, MD Primary Gastroenterologist:   Dr. Ardis Hughs      Reason for Consultation: Dizzy, weak, dark stool            HPI:   Charles [name] is a 69 y.o. male with a past medical history as listed below including emphysema, HLD, hypertension, CVA, DVT and PE on Coumadin, who presented to the ER on 09/27/2019 with a complaint of dizziness, weakness and passing a "dark stool".    Today, the patient explains that just yesterday when he stood up out of his recliner he felt dizzy "like I was drunk", he made it to the kitchen to sit down again but anytime he would stand up he felt weak and dizzy so his wife brought him to the ER.  Patient describes that he did pass a "dark stool" just yesterday one time.  He had not noticed this before then.  Also tells me that a couple of weeks ago he saw his PCP in regards to a feeling of food getting stuck in his throat on the way down.  He tells me that he started him on some medicine (we believe Pantoprazole 40 BID) and everything is better now.    Denies fever, chills, weight loss, heartburn, reflux, nausea, vomiting or abdominal pain.  ED course: VSS, labs reveal hemoglobin 10.8 (normally 13-14), INR 9.5, heme positive stool  GI history: 10/21/2018 colonoscopy Dr. Ardis Hughs: - Four 3 to 4 mm polyps in the descending colon, in the transverse colon, in the ascending colon and in the cecum, removed with a cold snare. Resected and retrieved. - One 12 mm polyp in the cecum, removed with a hot snare. Resected and retrieved. - Diverticulosis in the entire examined colon. - External and internal hemorrhoids. - The examination was otherwise normal on direct and retroflexion views. -  Pathology showed tubular adenomas repeat recommended in 3 years  06/15/2017 EGD by Dr. Hilarie Fredrickson in the hospital for melena: Normal other than 1 cm hiatal hernia  Past Medical History:  Diagnosis Date  .  ABNORMAL ELECTROCARDIOGRAM 11/02/2008  . ABSCESS, LUNG 10/23/2006  . Anemia 05/26/2014  . BACTERIAL PNEUMONIA 12/28/2009  . BPH (benign prostatic hyperplasia) 11/22/2010  . 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 CHEST PAIN 12/24/2009  . Coronary artery calcification seen on CT scan 06/01/2015  . Cramp of limb 07/19/2007  . DIVERTICULOSIS, COLON 03/07/2007  . DVT (deep venous thrombosis) ([place]) 01/2014   LLE  . Emphysema of lung ([place])   . 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/23/2006  . FLANK PAIN, LEFT 02/25/2010  . FREQUENCY, URINARY 12/24/2009  . HEMORRHOIDS 10/23/2006  . HYPERLIPIDEMIA 10/23/2006  . HYPERTENSION 10/23/2006  . PE (pulmonary embolism) 02/2014  . PLANTAR FASCIITIS, LEFT 11/02/2008  . Pneumonia 12/2013 X 2  . PSA, INCREASED 11/20/2007  . PULMONARY NODULE 05/14/2008  . Unspecified Peripheral Vascular Disease 10/23/2006    Past Surgical History:  Procedure Laterality Date  . ESOPHAGOGASTRODUODENOSCOPY (EGD) WITH PROPOFOL N/A 06/15/2017   Procedure: ESOPHAGOGASTRODUODENOSCOPY (EGD) WITH PROPOFOL;  Surgeon: Jerene Bears, MD;  Location: [facility];  Service: Gastroenterology;  Laterality: N/A;  . HEMORRHOID SURGERY  ?1990's  . INGUINAL HERNIA REPAIR Bilateral ?2000's  . IVC FILTER INSERTION N/A 10/03/2016   Procedure: IVC Filter Retrieveal;  Surgeon: Serafina Mitchell, MD;  Location: [facility];  Service: Cardiovascular;  Laterality: N/A;  . MYRINGOTOMY WITH TUBE PLACEMENT Right 06/26/2018  .  SHOULDER ARTHROSCOPY W/ ROTATOR CUFF REPAIR Right 11/2013  . STAPLING OF BLEBS Right 07/31/2016   Procedure: BLEBECTOMY;  Surgeon: Melrose Nakayama, MD;  Location: [facility];  Service: Thoracic;  Laterality: Right;  . VENA CAVA FILTER PLACEMENT Right 08/04/2016   Procedure: INSERTION VENA-CAVA FILTER;  Surgeon: Melrose Nakayama, MD;  Location: [facility];  Service: Thoracic;  Laterality: Right;  Marland Kitchen VIDEO ASSISTED THORACOSCOPY Right 07/31/2016   Procedure: VIDEO ASSISTED THORACOSCOPY;  Surgeon: Melrose Nakayama,  MD;  Location: [facility];  Service: Thoracic;  Laterality: Right;  Marland Kitchen VIDEO ASSISTED THORACOSCOPY Right 08/04/2016   Procedure: REDO VIDEO ASSISTED THORACOSCOPY- EVACUATION OF HEMOTHORAX;  Surgeon: Melrose Nakayama, MD;  Location: [place];  Service: Thoracic;  Laterality: Right;  Marland Kitchen VIDEO BRONCHOSCOPY WITH ENDOBRONCHIAL NAVIGATION N/A 03/11/2014   Procedure: VIDEO BRONCHOSCOPY WITH ENDOBRONCHIAL NAVIGATION;  Surgeon: Collene Gobble, MD;  Location: [facility];  Service: Thoracic;  Laterality: N/A;    Family History  Problem Relation Age of Onset  . Heart disease Mother   . Heart disease Father   . Kidney disease Sister        Renal transplant  . Colon cancer Neg Hx   . Rectal cancer Neg Hx      Social History   Tobacco Use  . Smoking status: Former Smoker    Packs/day: 1.00    Years: 15.00    Pack years: 15.00    Types: Cigarettes  . Smokeless tobacco: Never Used  . Tobacco comment: "quit smoking cigarettes in the 1990's"  Vaping Use  . Vaping Use: Never used  Substance Use Topics  . Alcohol use: Yes    Alcohol/week: 0.0 standard drinks    Comment: patient states he stopped 4-5 days ago  . 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 Authorizing Provider  amLODipine (NORVASC) 10 MG tablet Take 1 tablet (10 mg total) by mouth daily. 08/26/19  Yes Biagio Borg, MD  Cyanocobalamin (VITAMIN B12 PO) Take 1 tablet by mouth daily.   Yes [provider]  ezetimibe (ZETIA) 10 MG tablet Take 1 tablet (10 mg total) by mouth daily. Annual appt due in Oct must see provider for future refills Patient taking differently: Take 10 mg by mouth daily.  08/26/19  Yes Biagio Borg, MD  pantoprazole (PROTONIX) 40 MG tablet Take 1 tablet (40 mg total) by mouth 2 (two) times daily. Patient taking differently: Take 40 mg by mouth daily as needed (heartburn).  09/05/19  Yes Biagio Borg, MD    rosuvastatin (CRESTOR) 40 MG tablet Take 1 tablet by mouth once daily 09/23/19  Yes Biagio Borg, MD  tadalafil (CIALIS) 20 MG tablet TAKE 1 TABLET BY MOUTH ONCE DAILY AS NEEDED FOR ERECTILE DYSFUNCTION Patient taking differently: Take 20 mg by mouth daily as needed for erectile dysfunction.  07/17/19  Yes Biagio Borg, MD  temazepam (RESTORIL) 15 MG capsule TAKE 1 TO 2 CAPSULES BY MOUTH AT BEDTIME AS NEEDED FOR SLEEP Patient taking differently: Take 15-30 mg by mouth at bedtime as needed for sleep.  07/17/19  Yes Biagio Borg, MD  warfarin (COUMADIN) 5 MG tablet Take 5-7.5 mg by mouth See admin instructions. Sunday, Tuesday, Wednesday, Friday and Saturday take 5 mg AND Mondays and Thursdays take 7.5 mg 09/08/19  Yes [provider]  albuterol (VENTOLIN HFA) 108 (  90 Base) MCG/ACT inhaler Inhale 2 puffs into the lungs every 6 (six) hours as needed for wheezing or shortness of breath. Patient not taking: Reported on 09/27/2019 08/26/19   Biagio Borg, MD  apixaban (ELIQUIS) 5 MG TABS tablet Take 1 tablet (5 mg total) by mouth 2 (two) times daily. Patient not taking: Reported on 09/27/2019 08/26/19   Biagio Borg, MD    Current Facility-Administered Medications  Medication Dose Route Frequency Provider Last Rate Last Admin  . 0.9 %  sodium chloride infusion   Intravenous Continuous Norins, Heinz Knuckles, MD 10 mL/hr at 09/28/19 0255 New Bag at 09/28/19 0255  . acetaminophen (TYLENOL) tablet 650 mg  650 mg Oral Q6H PRN Norins, Heinz Knuckles, MD       Or  . acetaminophen (TYLENOL) suppository 650 mg  650 mg Rectal Q6H PRN Norins, Heinz Knuckles, MD      . amLODipine (NORVASC) tablet 10 mg  10 mg Oral Daily Norins, Heinz Knuckles, MD      . ezetimibe (ZETIA) tablet 10 mg  10 mg Oral Daily Norins, Heinz Knuckles, MD      . feeding supplement (BOOST / RESOURCE BREEZE) liquid 1 Container  1 Container Oral TID BM Florencia Reasons, MD      . pantoprazole (PROTONIX) 80 mg in sodium chloride 0.9 % 100 mL (0.8 mg/mL) infusion  8 mg/hr  Intravenous Continuous Norins, Heinz Knuckles, MD 10 mL/hr at 09/28/19 0344 8 mg/hr at 09/28/19 0344  . [START ON 10/01/2019] pantoprazole (PROTONIX) injection 40 mg  40 mg Intravenous Q12H Norins, Heinz Knuckles, MD      . rosuvastatin (CRESTOR) tablet 40 mg  40 mg Oral Daily Norins, Heinz Knuckles, MD      . zolpidem (AMBIEN) tablet 5 mg  5 mg Oral QHS PRN Norins, Heinz Knuckles, MD        Allergies as of 09/27/2019  . (No Known Allergies)     Review of Systems:    Constitutional: No weight loss, fever or chills Skin: No rash  Cardiovascular: No chest pain  Respiratory: +DOE Gastrointestinal: See HPI and otherwise negative Genitourinary: No dysuria  Neurological: +dizziness Musculoskeletal: No new muscle or joint pain Hematologic: No bleeding Psychiatric: No history of depression or anxiety    Physical Exam:  Vital signs in last 24 hours: Temp:  [97.9 F (36.6 C)-99.8 F (37.7 C)] 99.8 F (37.7 C) (08/08 0434) Pulse Rate:  [94-122] 103 (08/08 0434) Resp:  [20-33] 20 (08/08 0434) BP: (118-152)/(68-99) 152/99 (08/08 0434) SpO2:  [92 %-100 %] 100 % (08/08 0434) Weight:  [77.1 kg] 77.1 kg (08/08 0449) Last BM Date: 09/28/19 General:   Pleasant AA male appears to be in NAD, Well developed, Well nourished, alert and cooperative Head:  Normocephalic and atraumatic. Eyes:   PEERL, EOMI. No icterus. Conjunctiva pink. Ears:  Normal auditory acuity. Neck:  Supple Throat: Oral cavity and pharynx without inflammation, swelling or lesion.  Lungs: Respirations even and unlabored. Lungs clear to auscultation bilaterally.   No wheezes, crackles, or rhonchi.  Heart: Normal S1, S2. No MRG. Regular rate and rhythm. No peripheral edema, cyanosis or pallor.  Abdomen:  Soft, nondistended, nontender. No rebound or guarding. Normal bowel sounds. No appreciable masses or hepatomegaly. Rectal:  Not performed.  Msk:  Symmetrical without gross deformities. Peripheral pulses intact.  Extremities:  Without edema, no  deformity or joint abnormality.  Neurologic:  Alert and  oriented x4;  grossly normal neurologically.  Skin:   Dry and intact  without significant lesions or rashes. Psychiatric: Demonstrates good judgement and reason without abnormal affect or behaviors.   LAB RESULTS: Recent Labs    09/27/19 1935 09/28/19 0802  WBC 12.3*  --   HGB 10.8* 10.5*  HCT 30.6* 30.1*  PLT 183  --    BMET Recent Labs    09/27/19 1935  NA 132*  K 4.4  CL 98  CO2 15*  GLUCOSE 206*  BUN 57*  CREATININE 3.98*  CALCIUM 7.4*   PT/INR Recent Labs    09/27/19 1935  LABPROT 74.1*  INR 9.5*    STUDIES: DG Chest 2 View  Result Date: 09/27/2019 CLINICAL DATA:  Shortness of breath EXAM: CHEST - 2 VIEW COMPARISON:  06/13/2017 FINDINGS: The heart size and mediastinal contours are within normal limits. Both lungs are clear. Disc degenerative disease of the thoracic spine. IMPRESSION: No acute abnormality of the lungs. Electronically Signed   By: Eddie Candle M.D.   On: 09/27/2019 20:05   CT Head Wo Contrast  Result Date: 09/27/2019 CLINICAL DATA:  Dizziness EXAM: CT HEAD WITHOUT CONTRAST TECHNIQUE: Contiguous axial images were obtained from the base of the skull through the vertex without intravenous contrast. COMPARISON:  05/13/2014 FINDINGS: Brain: Old infarct in the right cerebellum. There is atrophy and chronic small vessel disease changes. No acute intracranial abnormality. Specifically, no hemorrhage, hydrocephalus, mass lesion, acute infarction, or significant intracranial injury. Vascular: No hyperdense vessel or unexpected calcification. Skull: No acute calvarial abnormality. Sinuses/Orbits: Visualized paranasal sinuses and mastoids clear. Orbital soft tissues unremarkable. Other: None IMPRESSION: Old right cerebellar infarct. Atrophy, chronic microvascular disease. No acute intracranial abnormality. Electronically Signed   By: Rolm Baptise M.D.   On: 09/27/2019 20:29    Impression / Plan:    Impression: 1.  Melena: Likely due to coagulopathy, INR 9.5 on Coumadin, reported melanotic stool which was heme positive on exam; most likely upper GI bleed 2.  Coagulopathy: INR 9.5, patient given 10 mg of vitamin K in the ER and Coumadin is on hold 3.  Dysphagia/GERD: Started on Pantoprazole 40 mg twice daily about 2 weeks ago for patient and these symptoms are better  Plan: 1.  For now plans are to normalize INR 2.  Continue twice daily PPI 3.  Patient will likely need more vitamin K, will leave to the hospitalist. 4.  Please await any further recommendations from Dr. Ardis Hughs later today.  Thank you for your kind consultation, we will continue to follow.  Lavone Nian [facility]  09/28/2019, 8:26 AM  ________________________________________________________________________  Velora Heckler GI MD note:  I personally examined the patient, reviewed the data and agree with the assessment and plan described above.  INR level was VERY high and I am not at all surprised that he has had bleeding.  Most important for now is to get his INR lower, he received 10mg  Vit K yesterday, repeat INR this morning still pending. He's continuing to have overt melena (had some about 30 minutes ago).  I am a bit concerned about his recent dysphagia. It has improved after starting PPI 2-3 weeks ago however it was significant enough for him to loose weight (he thinks 10-12 pounds).  For these reasons EGD is a reasonable next step once his INR will allow it to be done safely.  I am going to make him NPO after MN in case INR is adequate <1.7 or so.  May need more vit K today pending results from today's INR. I am stopping his protonix infusion and  instead ordering BID IV Protonix.  Ok for heart healthy diet for now.  CBC, INR in the morning (ordered).  Owens Loffler, MD [facility] Pager [phone]

## 2019-09-29 ENCOUNTER — Encounter (HOSPITAL_COMMUNITY)
Admission: EM | Disposition: A | Discharge status: Home / Self Care | Payer: Self-pay | Source: Home / Self Care | Attending: Internal Medicine

## 2019-09-29 ENCOUNTER — Other Ambulatory Visit: Payer: Self-pay

## 2019-09-29 ENCOUNTER — Inpatient Hospital Stay (HOSPITAL_COMMUNITY): Payer: Medicare Other | Admitting: Anesthesiology

## 2019-09-29 ENCOUNTER — Encounter (HOSPITAL_COMMUNITY): Payer: Self-pay | Admitting: Internal Medicine

## 2019-09-29 DIAGNOSIS — J439 Emphysema, unspecified: Secondary | ICD-10-CM

## 2019-09-29 DIAGNOSIS — K222 Esophageal obstruction: Secondary | ICD-10-CM | POA: Diagnosis not present

## 2019-09-29 DIAGNOSIS — I10 Essential (primary) hypertension: Secondary | ICD-10-CM | POA: Diagnosis not present

## 2019-09-29 DIAGNOSIS — K449 Diaphragmatic hernia without obstruction or gangrene: Secondary | ICD-10-CM

## 2019-09-29 DIAGNOSIS — K2990 Gastroduodenitis, unspecified, without bleeding: Secondary | ICD-10-CM

## 2019-09-29 DIAGNOSIS — K2970 Gastritis, unspecified, without bleeding: Secondary | ICD-10-CM | POA: Diagnosis not present

## 2019-09-29 DIAGNOSIS — R1310 Dysphagia, unspecified: Secondary | ICD-10-CM | POA: Diagnosis not present

## 2019-09-29 DIAGNOSIS — K3189 Other diseases of stomach and duodenum: Secondary | ICD-10-CM

## 2019-09-29 HISTORY — PX: BIOPSY: SHX5522

## 2019-09-29 HISTORY — PX: ESOPHAGOGASTRODUODENOSCOPY (EGD) WITH PROPOFOL: SHX5813

## 2019-09-29 LAB — CBC
HCT: 25.7 % — ABNORMAL LOW (ref 39.0–52.0)
Hemoglobin: 8.7 g/dL — ABNORMAL LOW (ref 13.0–17.0)
MCH: 30.3 pg (ref 26.0–34.0)
MCHC: 33.9 g/dL (ref 30.0–36.0)
MCV: 89.5 fL (ref 80.0–100.0)
Platelets: 94 10*3/uL — ABNORMAL LOW (ref 150–400)
RBC: 2.87 MIL/uL — ABNORMAL LOW (ref 4.22–5.81)
RDW: 14.4 % (ref 11.5–15.5)
WBC: 8.2 10*3/uL (ref 4.0–10.5)
nRBC: 1 % — ABNORMAL HIGH (ref 0.0–0.2)

## 2019-09-29 LAB — PROTIME-INR
INR: 1.2 (ref 0.8–1.2)
Prothrombin Time: 15 seconds (ref 11.4–15.2)

## 2019-09-29 LAB — BASIC METABOLIC PANEL
Anion gap: 10 (ref 5–15)
BUN: 40 mg/dL — ABNORMAL HIGH (ref 8–23)
CO2: 15 mmol/L — ABNORMAL LOW (ref 22–32)
Calcium: 7.3 mg/dL — ABNORMAL LOW (ref 8.9–10.3)
Chloride: 109 mmol/L (ref 98–111)
Creatinine, Ser: 2.32 mg/dL — ABNORMAL HIGH (ref 0.61–1.24)
GFR calc Af Amer: 32 mL/min — ABNORMAL LOW (ref >60)
GFR calc non Af Amer: 28 mL/min — ABNORMAL LOW (ref >60)
Glucose, Bld: 98 mg/dL (ref 70–99)
Potassium: 3.6 mmol/L (ref 3.5–5.1)
Sodium: 134 mmol/L — ABNORMAL LOW (ref 135–145)

## 2019-09-29 LAB — HEMOGLOBIN AND HEMATOCRIT, BLOOD
HCT: 28.7 % — ABNORMAL LOW (ref 39.0–52.0)
Hemoglobin: 9.5 g/dL — ABNORMAL LOW (ref 13.0–17.0)

## 2019-09-29 SURGERY — ESOPHAGOGASTRODUODENOSCOPY (EGD) WITH PROPOFOL
Anesthesia: Monitor Anesthesia Care

## 2019-09-29 MED ORDER — APIXABAN 2.5 MG PO TABS
2.5000 mg | ORAL_TABLET | Freq: Two times a day (BID) | ORAL | Status: DC
  Administered 2019-09-29 – 2019-09-30 (×2): 2.5 mg via ORAL
  Filled 2019-09-29 (×2): qty 1

## 2019-09-29 MED ORDER — PROPOFOL 10 MG/ML IV BOLUS
INTRAVENOUS | Status: DC | PRN
  Administered 2019-09-29 (×4): 20 mg via INTRAVENOUS

## 2019-09-29 MED ORDER — LIP MEDEX EX OINT
TOPICAL_OINTMENT | CUTANEOUS | Status: AC
  Filled 2019-09-29: qty 7

## 2019-09-29 MED ORDER — PANTOPRAZOLE SODIUM 40 MG PO TBEC
40.0000 mg | DELAYED_RELEASE_TABLET | Freq: Two times a day (BID) | ORAL | Status: DC
  Administered 2019-09-29 – 2019-09-30 (×2): 40 mg via ORAL
  Filled 2019-09-29 (×2): qty 1

## 2019-09-29 MED ORDER — PROPOFOL 500 MG/50ML IV EMUL
INTRAVENOUS | Status: DC | PRN
  Administered 2019-09-29: 125 ug/kg/min via INTRAVENOUS

## 2019-09-29 MED ORDER — LIDOCAINE 2% (20 MG/ML) 5 ML SYRINGE
INTRAMUSCULAR | Status: DC | PRN
  Administered 2019-09-29: 100 mg via INTRAVENOUS

## 2019-09-29 MED ORDER — PHENYLEPHRINE 40 MCG/ML (10ML) SYRINGE FOR IV PUSH (FOR BLOOD PRESSURE SUPPORT)
PREFILLED_SYRINGE | INTRAVENOUS | Status: DC | PRN
  Administered 2019-09-29: 80 ug via INTRAVENOUS

## 2019-09-29 MED ORDER — PROPOFOL 500 MG/50ML IV EMUL
INTRAVENOUS | Status: AC
  Filled 2019-09-29: qty 50

## 2019-09-29 SURGICAL SUPPLY — 15 items

## 2019-09-29 NOTE — Procedures (Addendum)
I spoke to the patient's wife by phone after the endoscopy. We went over the findings and plan Time provided for questions and answers and she thanked me for the call

## 2019-09-29 NOTE — Progress Notes (Addendum)
ANTICOAGULATION CONSULT NOTE - Initial Consult  Pharmacy Consult for: apixaban Indication: hx PE/DVT on warf PTA  No Known Allergies  Patient Measurements: Height: 6\' 4"  (193 cm) Weight: 77.1 kg (169 lb 15.6 oz) IBW/kg (Calculated) : 86.8 Heparin Dosing Weight: 77.1 kg  Vital Signs: Temp: 98.2 F (36.8 C) (08/09 1448) Temp Source: Oral (08/09 1448) BP: 127/77 (08/09 1448) Pulse Rate: 77 (08/09 1448)  Labs: Recent Labs    09/27/19 1935 09/27/19 1935 09/27/19 2059 09/28/19 0802 09/29/19 0158  HGB 10.8*   < >  --  10.5* 8.7*  HCT 30.6*  --   --  30.1* 25.7*  PLT 183  --   --   --  94*  LABPROT 74.1*  --   --  22.7* 15.0  INR 9.5*  --   --  2.1* 1.2  CREATININE 3.98*  --   --   --  2.32*  TROPONINIHS 48*  --  53*  --   --    < > = values in this interval not displayed.    Estimated Creatinine Clearance: 33.7 mL/min (A) (by C-G formula based on SCr of 2.32 mg/dL (H)).   Medical History: Past Medical History:  Diagnosis Date  . ABNORMAL ELECTROCARDIOGRAM 11/02/2008  . ABSCESS, LUNG 10/23/2006  . Anemia 05/26/2014  . BACTERIAL PNEUMONIA 12/28/2009  . BPH (benign prostatic hyperplasia) 11/22/2010  . 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 CHEST PAIN 12/24/2009  . Coronary artery calcification seen on CT scan 06/01/2015  . Cramp of limb 07/19/2007  . DIVERTICULOSIS, COLON 03/07/2007  . DVT (deep venous thrombosis) ([place]) 01/2014   LLE  . Emphysema of lung ([place])   . 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/23/2006  . FLANK PAIN, LEFT 02/25/2010  . FREQUENCY, URINARY 12/24/2009  . HEMORRHOIDS 10/23/2006  . HYPERLIPIDEMIA 10/23/2006  . HYPERTENSION 10/23/2006  . PE (pulmonary embolism) 02/2014  . PLANTAR FASCIITIS, LEFT 11/02/2008  . Pneumonia 12/2013 X 2  . PSA, INCREASED 11/20/2007  . PULMONARY NODULE 05/14/2008  . Unspecified Peripheral Vascular Disease 10/23/2006    Medications:  Medications Prior to Admission  Medication Sig Dispense Refill Last Dose  . amLODipine (NORVASC) 10 MG tablet Take 1  tablet (10 mg total) by mouth daily. 90 tablet 3 09/27/2019 at Unknown time  . Cyanocobalamin (VITAMIN B12 PO) Take 1 tablet by mouth daily.   Past Week at Unknown time  . ezetimibe (ZETIA) 10 MG tablet Take 1 tablet (10 mg total) by mouth daily. Annual appt due in Oct must see provider for future refills (Patient taking differently: Take 10 mg by mouth daily. ) 90 tablet 3 09/27/2019 at Unknown time  . pantoprazole (PROTONIX) 40 MG tablet Take 1 tablet (40 mg total) by mouth 2 (two) times daily. (Patient taking differently: Take 40 mg by mouth daily as needed (heartburn). ) 60 tablet 1 Past Week at Unknown time  . rosuvastatin (CRESTOR) 40 MG tablet Take 1 tablet by mouth once daily 90 tablet 0 09/27/2019 at Unknown time  . tadalafil (CIALIS) 20 MG tablet TAKE 1 TABLET BY MOUTH ONCE DAILY AS NEEDED FOR ERECTILE DYSFUNCTION (Patient taking differently: Take 20 mg by mouth daily as needed for erectile dysfunction. ) 6 tablet 11 Past Week at Unknown time  . temazepam (RESTORIL) 15 MG capsule TAKE 1 TO 2 CAPSULES BY MOUTH AT BEDTIME AS NEEDED FOR SLEEP (Patient taking differently: Take 15-30 mg by mouth at bedtime as needed for sleep. ) 60 capsule 5 Past Week at Unknown time  .  warfarin (COUMADIN) 5 MG tablet Take 5-7.5 mg by mouth See admin instructions. Sunday, Tuesday, Wednesday, Friday and Saturday take 5 mg AND Mondays and Thursdays take 7.5 mg   09/27/2019 at 0930  . albuterol (VENTOLIN HFA) 108 (90 Base) MCG/ACT inhaler Inhale 2 puffs into the lungs every 6 (six) hours as needed for wheezing or shortness of breath. (Patient not taking: Reported on 09/27/2019) 8 g 5 Not Taking at Unknown time  . apixaban (ELIQUIS) 5 MG TABS tablet Take 1 tablet (5 mg total) by mouth 2 (two) times daily. (Patient not taking: Reported on 09/27/2019) 60 tablet 11 Not Taking at Unknown time    Assessment: 69 yo M on warfarin PTA for hx PE/DVT.  PTA on warfarin 5 mg Sun,Tu, Wed, Fri. Sat and 7.5 mg Mon & Thursday> last dose 8/7 at  0930 am. Admit INR 9.5> vitamin K 10 mg IVPB 8/7 & vitamin K 5 mg IVPB 8/8 INR down to 1.2 today.  Hg 10.8> 8.7 PLTC 183> 94 SCr 3.98> 2.32.  8/8 EGD>OK to resume anticoag per GI  He has been on therapeutic anticoagulation for > 6 months so he qualifies for indefinite anticoagulation reduced-intensity dosing for prophylaxis against VTE recurrence dose of 2.5 bid- d/w with Dr Earnest Conroy   Goal of Therapy:  Monitor platelets by anticoagulation protocol: Yes   Plan:  Tonight at 8 pm:  apixaban 2.5 mg po BID  Eudelia Bunch, Pharm.D 09/29/2019 3:29 PM

## 2019-09-29 NOTE — Anesthesia Postprocedure Evaluation (Signed)
Anesthesia Post Note  Patient: Charles [name]  Procedure(s) Performed: ESOPHAGOGASTRODUODENOSCOPY (EGD) WITH PROPOFOL (N/A ) BIOPSY     Patient location during evaluation: PACU Anesthesia Type: MAC Level of consciousness: awake and alert Pain management: pain level controlled Vital Signs Assessment: post-procedure vital signs reviewed and stable Respiratory status: spontaneous breathing, nonlabored ventilation, respiratory function stable and patient connected to nasal cannula oxygen Cardiovascular status: stable and blood pressure returned to baseline Postop Assessment: no apparent nausea or vomiting Anesthetic complications: no   No complications documented.  Last Vitals:  Vitals:   09/29/19 1136 09/29/19 1347  BP: 125/66 (!) 93/46  Pulse: 82 85  Resp: 18 20  Temp: 37.2 C 36.5 C  SpO2: 99% 100%    Last Pain:  Vitals:   09/29/19 1347  TempSrc: Oral  PainSc: 0-No pain                 Merlinda Frederick

## 2019-09-29 NOTE — Interval H&P Note (Signed)
History and Physical Interval Note: For EGD today to eval melena, dysphagia, acute anemia INR corrected with Vit K HIGHER THAN BASELINE RISK.The nature of the procedure, as well as the risks, benefits, and alternatives were carefully and thoroughly reviewed with the patient. Ample time for discussion and questions allowed. The patient understood, was satisfied, and agreed to proceed.   Lab Results  Component Value Date   INR 1.2 09/29/2019   INR 2.1 (H) 09/28/2019   INR 9.5 (HH) 09/27/2019     09/29/2019 12:58 PM  Charles [name]  has presented today for surgery, with the diagnosis of Melena, Dysphagia.  The various methods of treatment have been discussed with the patient and family. After consideration of risks, benefits and other options for treatment, the patient has consented to  Procedure(s): ESOPHAGOGASTRODUODENOSCOPY (EGD) WITH PROPOFOL (N/A) as a surgical intervention.  The patient's history has been reviewed, patient examined, no change in status, stable for surgery.  I have reviewed the patient's chart and labs.  Questions were answered to the patient's satisfaction.     Lajuan Lines Sade Mehlhoff

## 2019-09-29 NOTE — Transfer of Care (Signed)
Immediate Anesthesia Transfer of Care Note  Patient: KRYSTIAN YOUNGLOVE  Procedure(s) Performed: ESOPHAGOGASTRODUODENOSCOPY (EGD) WITH PROPOFOL (N/A ) BIOPSY  Patient Location: Endoscopy Unit  Anesthesia Type:MAC  Level of Consciousness: drowsy  Airway & Oxygen Therapy: Patient Spontanous Breathing and Patient connected to face mask oxygen  Post-op Assessment: Report given to RN and Post -op Vital signs reviewed and stable  Post vital signs: Reviewed and stable  Last Vitals:  Vitals Value Taken Time  BP    Temp    Pulse 87 09/29/19 1344  Resp 18 09/29/19 1344  SpO2 97 % 09/29/19 1344  Vitals shown include unvalidated device data.  Last Pain:  Vitals:   09/29/19 1136  TempSrc: Oral  PainSc: 0-No pain      Patients Stated Pain Goal: 0 (78/58/85 0277)  Complications: No complications documented.

## 2019-09-29 NOTE — Progress Notes (Addendum)
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  GI consulted , plan for EGD    Subjective: N.p.o. when seen in rounds in anticipation of EGD.  States had a bowel movement earlier this morning and appeared to be "turning brown" per patient.  Hemoglobin down to 8.7 today.  Objective: Vitals:   09/29/19 1349 09/29/19 1359 09/29/19 1410 09/29/19 1448  BP: 105/65 114/60 138/68 127/77  Pulse: 89 72  77  Resp: (!) 23 (!) 26 18 16   Temp:    98.2 F (36.8 C)  TempSrc:    Oral  SpO2: 100% 98% 98% 100%  Weight:      Height:        Intake/Output Summary (Last 24 hours) at 09/29/2019 1456 Last data filed at 09/29/2019 1452 Gross per 24 hour  Intake 1765.74 ml  Output 2150 ml  Net -384.26 ml   Filed Weights   09/28/19 0449 09/29/19 1136  Weight: 77.1 kg 77.1 kg    Physical Examination:  General: Moderately built, no acute distress noted Head ENT: Atraumatic normocephalic, PERRLA, neck supple Heart: S1-S2 heard, regular rate and rhythm, no murmurs.  No leg edema noted Lungs: Equal air entry bilaterally, no rhonchi or rales on exam, no accessory muscle use Abdomen: Bowel sounds heard, soft, nontender, nondistended. No organomegaly.  No CVA tenderness Extremities: No pedal edema.  No cyanosis or clubbing. Neurological: Awake alert oriented x3, no focal weakness or numbness, strength and sensations to crude touch intact Skin: No wounds or rashes.  Data Reviewed: I have personally reviewed following labs and imaging studies  CBC: Recent Labs  Lab 09/27/19 1935 09/28/19 0802 09/29/19 0158  WBC 12.3*  --  8.2  HGB 10.8* 10.5* 8.7*  HCT 30.6* 30.1* 25.7*  MCV 89.5  --  89.5  PLT  183  --  94*   Basic Metabolic Panel: Recent Labs  Lab 09/27/19 1935 09/27/19 2059 09/29/19 0158  NA 132*  --  134*  K 4.4  --  3.6  CL 98  --  109  CO2 15*  --  15*  GLUCOSE 206*  --  98  BUN 57*  --  40*  CREATININE 3.98*  --  2.32*  CALCIUM 7.4*  --  7.3*  MG  --  2.0  --    GFR: Estimated Creatinine Clearance: 33.7 mL/min (A) (by C-G formula based on SCr of 2.32 mg/dL (H)). Liver Function Tests: No results for input(s): AST, ALT, ALKPHOS, BILITOT, PROT, ALBUMIN in the last 168 hours. No results for input(s): LIPASE, AMYLASE in the last 168 hours. No results for input(s): AMMONIA in the last 168 hours. Coagulation Profile: Recent Labs  Lab 09/27/19 1935 09/28/19 0802 09/29/19 0158  INR 9.5* 2.1* 1.2   Cardiac Enzymes: No results for input(s): CKTOTAL, CKMB, CKMBINDEX, TROPONINI in the last 168 hours. BNP (last 3 results) No results for input(s): PROBNP in the last 8760 hours. HbA1C: No results for input(s): HGBA1C in the last 72 hours. CBG: No results for input(s): GLUCAP in the last 168 hours. Lipid Profile: No results for input(s): CHOL, HDL, LDLCALC, TRIG, CHOLHDL, LDLDIRECT in the last 72 hours. Thyroid Function Tests: No results for input(s): TSH,  T4TOTAL, FREET4, T3FREE, THYROIDAB in the last 72 hours. Anemia Panel: No results for input(s): VITAMINB12, FOLATE, FERRITIN, TIBC, IRON, RETICCTPCT in the last 72 hours. Sepsis Labs: Recent Labs  Lab 09/27/19 2102 09/28/19 0020  LATICACIDVEN 3.1* 2.7*    Recent Results (from the past 240 hour(s))  SARS Coronavirus 2 by RT PCR (hospital order, performed in [facility] hospital lab) Nasopharyngeal Nasopharyngeal Swab     Status: None   Collection Time: 09/28/19  1:38 AM   Specimen: Nasopharyngeal Swab  Result Value Ref Range Status   SARS Coronavirus 2 NEGATIVE NEGATIVE Final    Comment: (NOTE) SARS-CoV-2 target nucleic acids are NOT DETECTED.  The SARS-CoV-2 RNA is generally detectable in upper and  lower respiratory specimens during the acute phase of infection. The lowest concentration of SARS-CoV-2 viral copies this assay can detect is 250 copies / mL. A negative result does not preclude SARS-CoV-2 infection and should not be used as the sole basis for treatment or other patient management decisions.  A negative result may occur with improper specimen collection / handling, submission of specimen other than nasopharyngeal swab, presence of viral mutation(s) within the areas targeted by this assay, and inadequate number of viral copies (<250 copies / mL). A negative result must be combined with clinical observations, patient history, and epidemiological information.  Fact Sheet for Patients:   StrictlyIdeas.no  Fact Sheet for Healthcare Providers: BankingDealers.co.za  This test is not yet approved or  cleared by the [place] FDA and has been authorized for detection and/or diagnosis of SARS-CoV-2 by FDA under an Emergency Use Authorization (EUA).  This EUA will remain in effect (meaning this test can be used) for the duration of the COVID-19 declaration under Section 564(b)(1) of the Act, 21 U.S.C. section 360bbb-3(b)(1), unless the authorization is terminated or revoked sooner.  Performed at [facility], [place] [address], [place], [place] [address]       Radiology Studies: DG Chest 2 View  Result Date: 09/27/2019 CLINICAL DATA:  Shortness of breath EXAM: CHEST - 2 VIEW COMPARISON:  06/13/2017 FINDINGS: The heart size and mediastinal contours are within normal limits. Both lungs are clear. Disc degenerative disease of the thoracic spine. IMPRESSION: No acute abnormality of the lungs. Electronically Signed   By: Eddie Candle M.D.   On: 09/27/2019 20:05   CT Head Wo Contrast  Result Date: 09/27/2019 CLINICAL DATA:  Dizziness EXAM: CT HEAD WITHOUT CONTRAST TECHNIQUE: Contiguous axial images were obtained  from the base of the skull through the vertex without intravenous contrast. COMPARISON:  05/13/2014 FINDINGS: Brain: Old infarct in the right cerebellum. There is atrophy and chronic small vessel disease changes. No acute intracranial abnormality. Specifically, no hemorrhage, hydrocephalus, mass lesion, acute infarction, or significant intracranial injury. Vascular: No hyperdense vessel or unexpected calcification. Skull: No acute calvarial abnormality. Sinuses/Orbits: Visualized paranasal sinuses and mastoids clear. Orbital soft tissues unremarkable. Other: None IMPRESSION: Old right cerebellar infarct. Atrophy, chronic microvascular disease. No acute intracranial abnormality. Electronically Signed   By: Rolm Baptise M.D.   On: 09/27/2019 20:29      Scheduled Meds: . amLODipine  10 mg Oral Daily  . ezetimibe  10 mg Oral Daily  . feeding supplement  1 Container Oral TID BM  . lip balm      . pantoprazole  40 mg Oral BID AC  . rosuvastatin  40 mg Oral Daily   Continuous Infusions: . sodium chloride 75 mL/hr at 09/29/19 1315     1.  Upper GI bleed: Seen by GI and underwent EGD today with findings of gastritis and duodenitis.  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  Await H. pylori report.  PPI twice daily recommended by GI Dr. Hilarie Fredrickson for gastritis although no active bleeding found.  Per GI okay to resume anticoagulation (specifically Eliquis per patient choice) with close monitoring of hemoglobin.  Anticoagulate with heparin and transition to Eliquis in a.m. if no significant bleeding and hemoglobin stable.  Advance diet as tolerated and DC IV fluids.  2.  Acute blood loss anemia: Hemoglobin was around 10.5 on presentation and dropped to 8.7 this morning although melena appears to be resolving.  Could be dilutional with IV fluids as well.  DC IV fluids and monitor.    3.   Hyperlipidemia: Continue statin/Zetia  4. Hypertension: Continue Norvasc.  5.  COPD: Stable.  MDI as needed  Time spent: 25 minutes    >50% time spent in discussions with care team and coordination of care.    Guilford Shi, MD [facility] Pager in [place]  If 7PM-7AM, please contact night-coverage [email or web address] 09/29/2019, 2:56 PM

## 2019-09-29 NOTE — Progress Notes (Signed)
Initial Nutrition Assessment  INTERVENTION:   -Boost Breeze po TID, each supplement provides 250 kcal and 9 grams of protein  NUTRITION DIAGNOSIS:   Inadequate oral intake related to dysphagia as evidenced by per patient/family report.  GOAL:   Patient will meet greater than or equal to 90% of their needs  MONITOR:   PO intake, Supplement acceptance, Labs, Weight trends, I & O's  REASON FOR ASSESSMENT:   Malnutrition Screening Tool    ASSESSMENT:   69 y.o. male with medical history significant of h/o CVA, h/o PE, emphysema, HLD, HTN presents to [facility] for evaluation of dizziness, weakness and due to having had a very dark stool earlier in the day.  Unable to speak with pt, was in endoscopy for EGD today. Pt now on soft diet following procedure.   Per chart review, pt with history of CVA. Pt was reporting dysphagia r/t GERD.  EGD revealed gastritis, duodenitis, esophageal stenosis.  Per weight records, pt weighed 179 lbs in May 2020. Current weight: 170 lbs. Insignificant weight changes.  Medications reviewed. Labs reviewed: Low Na  NUTRITION - FOCUSED PHYSICAL EXAM:  Unable to complete  Diet Order:   Diet Order            DIET SOFT Room service appropriate? Yes; Fluid consistency: Thin  Diet effective now                 EDUCATION NEEDS:   No education needs have been identified at this time  Skin:  Skin Assessment: Reviewed RN Assessment  Last BM:  8/8  Height:   Ht Readings from Last 1 Encounters:  09/28/19 6\' 4"  (1.93 m)    Weight:   Wt Readings from Last 1 Encounters:  09/29/19 77.1 kg    BMI:  Body mass index is 20.69 kg/m.  Estimated Nutritional Needs:   Kcal:  1900-2100  Protein:  85-95g  Fluid:  2L/day  Clayton Bibles, MS, RD, LDN Inpatient Clinical Dietitian Contact information available via Amion

## 2019-09-29 NOTE — Anesthesia Preprocedure Evaluation (Addendum)
Anesthesia Evaluation  Patient identified by MRN, date of birth, ID band Patient awake    Airway Mallampati: II  TM Distance: >3 FB Neck ROM: Full    Dental no notable dental hx.    Pulmonary COPD, former smoker,    Pulmonary exam normal breath sounds clear to auscultation + decreased breath sounds      Cardiovascular hypertension, + CAD and + Peripheral Vascular Disease  Normal cardiovascular exam Rhythm:Regular Rate:Normal     Neuro/Psych  Headaches, CVA (2016)    GI/Hepatic GERD  ,  Endo/Other    Renal/GU ARF and CRFRenal disease     Musculoskeletal  (+) Arthritis ,   Abdominal   Peds  Hematology  (+) anemia , Platelets 94    Anesthesia Other Findings   Reproductive/Obstetrics                            Anesthesia Physical Anesthesia Plan  ASA: III  Anesthesia Plan: MAC   Post-op Pain Management:    Induction: Intravenous  PONV Risk Score and Plan: 1 and Treatment may vary due to age or medical condition, Propofol infusion and Ondansetron  Airway Management Planned: Natural Airway  Additional Equipment:   Intra-op Plan:   Post-operative Plan:   Informed Consent: I have reviewed the patients History and Physical, chart, labs and discussed the procedure including the risks, benefits and alternatives for the proposed anesthesia with the patient or authorized representative who has indicated his/her understanding and acceptance.     Dental advisory given  Plan Discussed with:   Anesthesia Plan Comments:         Anesthesia Quick Evaluation

## 2019-09-29 NOTE — Op Note (Signed)
[facility] Patient Name: Charles [name] Procedure Date: 09/29/2019 MRN: [id number] Attending MD: Jerene Bears , MD Date of Birth: 1950/08/18 CSN: [id number] Age: 69 Admit Type: Outpatient Procedure:                Upper GI endoscopy Indications:              Dysphagia (improved with PPI), Melena, weight loss,                            acute anemia Providers:                Lajuan Lines. Hilarie Fredrickson, MD, Josie Dixon, RN, Tyrone Apple, Technician, Danley Danker, CRNA Referring MD:             [facility] Medicines:                Monitored Anesthesia Care Complications:            No immediate complications. Estimated Blood Loss:     Estimated blood loss was minimal. Procedure:                Pre-Anesthesia Assessment:                           - Prior to the procedure, a History and Physical                            was performed, and patient medications and                            allergies were reviewed. The patient's tolerance of                            previous anesthesia was also reviewed. The risks                            and benefits of the procedure and the sedation                            options and risks were discussed with the patient.                            All questions were answered, and informed consent                            was obtained. Prior Anticoagulants: The patient has                            taken no previous anticoagulant or antiplatelet                            agents. ASA Grade Assessment: III - A patient with  severe systemic disease. After reviewing the risks                            and benefits, the patient was deemed in                            satisfactory condition to undergo the procedure.                           After obtaining informed consent, the endoscope was                            passed under direct vision. Throughout the                             procedure, the patient's blood pressure, pulse, and                            oxygen saturations were monitored continuously. The                            [id number]-H190 ([id number] Olympus gastroscope was                            introduced through the mouth, and advanced to the                            second part of duodenum. The upper GI endoscopy was                            accomplished without difficulty. The patient                            tolerated the procedure well. Scope In: Scope Out: Findings:      One benign-appearing, intrinsic mild stenosis was found 38 cm from the       incisors. This stenosis measured 1.6 cm (inner diameter) x 1 cm (in       length). The stenosis was traversed. There is no significant esophagitis       at present. There are scattered venous blebs in the mid and distal       esophagus which do not appear like varices.      A 3 cm hiatal hernia was found. The proximal extent of the gastric folds       (end of tubular esophagus) was 40 cm from the incisors. The hiatal       narrowing was 43 cm from the incisors. Minor inflammation at the hiatus       hernia/diaphragmatic hiatus without active bleeding.      Mild inflammation characterized by erythema was found in the gastric       antrum with minor contact oozing. Biopsies were taken with a cold       forceps for histology and Helicobacter pylori testing.      A single 10 mm submucosal nodule was found in the duodenal bulb       immediately past the pylorus. Biopsies (bite  on bite) were taken with a       cold forceps for histology.      Mild inflammation characterized by erythema was found in the duodenal       bulb.      The second portion of the duodenum was normal. Impression:               - Benign-appearing esophageal stenosis which                            appears non-obstructive.                           - 3 cm hiatal hernia.                           - Gastritis.  Biopsied.                           - Submucosal nodule found in the duodenum. Biopsied.                           - Duodenitis.                           - Normal second portion of the duodenum. Moderate Sedation:      N/A Recommendation:           - Return patient to hospital ward for ongoing care.                           - Advance diet as tolerated.                           - Continue present medications including BID PPI.                            Can resume warfarin with close attention to INR and                            hemoglobin. I expect the recent melena and acute                            anemia are most related to the supratherapeutic INR                            now corrected.                           - Await pathology results.                           - Consider EUS as an outpatient to fully                            characterize the submucosal nodule in the duodenal  bulb.                           - Outpatient followup with Dr. Ardis Hughs. Procedure Code(s):        --- Professional ---                           [phone], Esophagogastroduodenoscopy, flexible,                            transoral; with biopsy, single or multiple Diagnosis Code(s):        --- Professional ---                           K22.2, Esophageal obstruction                           K44.9, Diaphragmatic hernia without obstruction or                            gangrene                           K29.70, Gastritis, unspecified, without bleeding                           K31.89, Other diseases of stomach and duodenum                           K29.80, Duodenitis without bleeding                           R13.10, Dysphagia, unspecified                           K92.1, Melena (includes Hematochezia) CPT copyright 2019 American Medical Association. All rights reserved. The codes documented in this report are preliminary and upon coder review may  be revised to meet current  compliance requirements. Jerene Bears, MD 09/29/2019 2:14:42 PM This report has been signed electronically. Number of Addenda: 0

## 2019-09-29 NOTE — Progress Notes (Signed)
Pt presented for procedure in ENDO.  Unwitnessed consent in chart.  Pt confirmed consent signature dated 09/29/19 - 8:38am.  Witness signed in ENDO.

## 2019-09-29 NOTE — Anesthesia Procedure Notes (Signed)
Date/Time: 09/29/2019 1:18 PM Performed by: Sharlette Dense, CRNA Oxygen Delivery Method: Simple face mask

## 2019-09-29 NOTE — Discharge Instructions (Signed)

## 2019-09-30 ENCOUNTER — Encounter (HOSPITAL_COMMUNITY): Payer: Self-pay | Admitting: Internal Medicine

## 2019-09-30 ENCOUNTER — Other Ambulatory Visit: Payer: Self-pay

## 2019-09-30 DIAGNOSIS — N179 Acute kidney failure, unspecified: Secondary | ICD-10-CM | POA: Diagnosis not present

## 2019-09-30 DIAGNOSIS — K922 Gastrointestinal hemorrhage, unspecified: Secondary | ICD-10-CM | POA: Diagnosis not present

## 2019-09-30 DIAGNOSIS — N400 Enlarged prostate without lower urinary tract symptoms: Secondary | ICD-10-CM | POA: Diagnosis not present

## 2019-09-30 DIAGNOSIS — N183 Chronic kidney disease, stage 3 unspecified: Secondary | ICD-10-CM

## 2019-09-30 DIAGNOSIS — K3189 Other diseases of stomach and duodenum: Secondary | ICD-10-CM | POA: Diagnosis not present

## 2019-09-30 LAB — BASIC METABOLIC PANEL
Anion gap: 8 (ref 5–15)
BUN: 25 mg/dL — ABNORMAL HIGH (ref 8–23)
CO2: 20 mmol/L — ABNORMAL LOW (ref 22–32)
Calcium: 8.1 mg/dL — ABNORMAL LOW (ref 8.9–10.3)
Chloride: 113 mmol/L — ABNORMAL HIGH (ref 98–111)
Creatinine, Ser: 2.02 mg/dL — ABNORMAL HIGH (ref 0.61–1.24)
GFR calc Af Amer: 38 mL/min — ABNORMAL LOW (ref >60)
GFR calc non Af Amer: 33 mL/min — ABNORMAL LOW (ref >60)
Glucose, Bld: 95 mg/dL (ref 70–99)
Potassium: 3.7 mmol/L (ref 3.5–5.1)
Sodium: 141 mmol/L (ref 135–145)

## 2019-09-30 LAB — HEMOGLOBIN AND HEMATOCRIT, BLOOD
HCT: 26 % — ABNORMAL LOW (ref 39.0–52.0)
Hemoglobin: 8.7 g/dL — ABNORMAL LOW (ref 13.0–17.0)

## 2019-09-30 MED ORDER — APIXABAN 2.5 MG PO TABS: 2.5000 mg | ORAL_TABLET | Freq: Two times a day (BID) | ORAL | 0 refills | Status: DC

## 2019-09-30 NOTE — Discharge Summary (Signed)
Physician Discharge Summary  Charles [name] QHU:[id number] DOB: 16-Sep-1950 DOA: 09/27/2019  PCP: Biagio Borg, MD  Admit date: 09/27/2019 Discharge date: 09/30/2019 Consultations: Gastroenterology Admitted From: Home Disposition: Home  Discharge Diagnoses:  Principal Problem:   Acute upper GI bleed Active Problems:   Gastritis and gastroduodenitis   AKI (acute kidney injury) ([place])   Hyperlipidemia   Essential hypertension   GERD   COPD (chronic obstructive pulmonary disease) (HCC)   BPH (benign prostatic hyperplasia)   CKD (chronic kidney disease), stage III   Acute DVT (deep venous thrombosis) (HCC)   Coagulopathy (HCC)   Hiatal hernia   Duodenal nodule   Hospital Course Summary: 69 year old male with history of COPD, cerebellar CVA, 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    Admitted to hospitalist service for further management and GI consulted.   1.  Upper GI bleed: Seen by GI and underwent EGD 8/9 with findings of gastritis and duodenitis.  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  Await H. pylori report.  PPI twice daily x 30 days recommended by GI Dr. Hilarie Fredrickson for gastritis although no active bleeding found.  Per GI okay to resume anticoagulation (specifically Eliquis per patient choice) with close monitoring of hemoglobin which was started yesterday at renal dose of 2.5 mg twice daily.  Hemoglobin remained stable around 8.7.  No active bleeding and having brown stools now.  Patient seen by GI and cleared for discharge today with follow-up as outpatient for H. pylori results.  2.  Acute blood loss anemia: Hemoglobin was around 10.5 on presentation and dropped to 8.7 yesterday morning and stable on repeat labs after initiating  anticoagulation.  Initial drop could be dilutional with IV fluids in addition to GI bleed..   3. History of DVT/PE: Patient states he was hospitalized 3 years back for lung collapse and upon discharge developed dyspnea and presented back to the hospital when he was diagnosed with DVT/PE.  Apparently was not started on anticoagulation with newer agents in concern for renal function.  Instead was started on Coumadin.  He also had IVC filter at that time which was retrieved about a year back.  Patient not very clear on recommended duration of anticoagulation treatment.  Patient advised to follow-up PCP and possibly hematology referral for establishing length of treatment especially with current presentation of GI bleed (albeit in the setting of supratherapeutic INR).  Patient now transition to Eliquis upon discharge  4. Hypertension: Continue Norvasc.  5. AKI on CKD stage IIIb:Patient presented with BUN 57 and creatinine 3.98 in the setting of GI bleed/dehydration..Baseline creatinine appears to be around 2-2.2.  Upon discharge creatinine improved to 2.0 and GFR 38  6. Hyperlipidemia: Continue statin/Zetia  7. COPD: Stable.  MDI as needed  Discharge Exam:   Vitals:   09/29/19 1830 09/29/19 2141 09/30/19 0158 09/30/19 0539  BP: 119/74 122/78 118/73 112/66  Pulse: 81 94 76 75  Resp: 16 18 18 18   Temp: 98.2 F (36.8 C) 98.4 F (36.9 C) 98.9 F (37.2 C) 98.2 F (36.8 C)  TempSrc: Oral Oral Oral Oral  SpO2: 100% 100% 100% 100%  Weight:      Height:        General: Pt is alert, awake, not in acute distress Cardiovascular: RRR, S1/S2 +, no rubs, no gallops Respiratory: CTA bilaterally,  no wheezing, no rhonchi Abdominal: Soft, NT, ND, bowel sounds + Extremities: no edema, no cyanosis  Discharge Condition:Stable CODE STATUS: Full code Diet recommendation: Avoid gastric irritants, low-salt low-fat Recommendations for Outpatient Follow-up:  1. Follow up with PCP: 1 week 2. Follow up  with consultants: GI as scheduled 3. Please obtain follow up labs including: CBC in 1 week  Home Health services upon discharge: None Equipment/Devices upon discharge: None   Discharge Instructions:  Discharge Instructions    Call MD for:  difficulty breathing, headache or visual disturbances   Complete by: As directed    Call MD for:  extreme fatigue   Complete by: As directed    Call MD for:  persistant dizziness or light-headedness   Complete by: As directed    Call MD for:  severe uncontrolled pain   Complete by: As directed    Call MD for:  temperature >100.4   Complete by: As directed    Diet - low sodium heart healthy   Complete by: As directed    Increase activity slowly   Complete by: As directed      Allergies as of 09/30/2019   No Known Allergies     Medication List    STOP taking these medications   warfarin 5 MG tablet Commonly known as: COUMADIN     TAKE these medications   albuterol 108 (90 Base) MCG/ACT inhaler Commonly known as: VENTOLIN HFA Inhale 2 puffs into the lungs every 6 (six) hours as needed for wheezing or shortness of breath.   amLODipine 10 MG tablet Commonly known as: NORVASC Take 1 tablet (10 mg total) by mouth daily.   apixaban 2.5 MG Tabs tablet Commonly known as: ELIQUIS Take 1 tablet (2.5 mg total) by mouth 2 (two) times daily. What changed:   medication strength  how much to take  when to take this   ezetimibe 10 MG tablet Commonly known as: ZETIA Take 1 tablet (10 mg total) by mouth daily. Annual appt due in Oct must see provider for future refills What changed: additional instructions   pantoprazole 40 MG tablet Commonly known as: PROTONIX Take 1 tablet (40 mg total) by mouth 2 (two) times daily. What changed:   when to take this  reasons to take this   rosuvastatin 40 MG tablet Commonly known as: CRESTOR Take 1 tablet by mouth once daily   tadalafil 20 MG tablet Commonly known as: CIALIS TAKE 1 TABLET  BY MOUTH ONCE DAILY AS NEEDED FOR ERECTILE DYSFUNCTION What changed: See the new instructions.   temazepam 15 MG capsule Commonly known as: RESTORIL TAKE 1 TO 2 CAPSULES BY MOUTH AT BEDTIME AS NEEDED FOR SLEEP What changed: See the new instructions.   VITAMIN B12 PO Take 1 tablet by mouth daily.       Follow-up Information    Levin Erp, PA Follow up on 11/10/2019.   Specialty: Gastroenterology Why: at 2pm. Please arrive 10 minutes early Contact information: [address] [address] [place]  [address] [phone]              No Known Allergies    The results of significant diagnostics from this hospitalization (including imaging, microbiology, ancillary and laboratory) are listed below for reference.    Labs: BNP (last 3 results) Recent Labs    09/27/19 2125  BNP 742.5*   Basic Metabolic Panel: Recent Labs  Lab 09/27/19 1935 09/27/19 2059 09/29/19 0158 09/30/19 0826  NA 132*  --  134*  141  K 4.4  --  3.6 3.7  CL 98  --  109 113*  CO2 15*  --  15* 20*  GLUCOSE 206*  --  98 95  BUN 57*  --  40* 25*  CREATININE 3.98*  --  2.32* 2.02*  CALCIUM 7.4*  --  7.3* 8.1*  MG  --  2.0  --   --    Liver Function Tests: No results for input(s): AST, ALT, ALKPHOS, BILITOT, PROT, ALBUMIN in the last 168 hours. No results for input(s): LIPASE, AMYLASE in the last 168 hours. No results for input(s): AMMONIA in the last 168 hours. CBC: Recent Labs  Lab 09/27/19 1935 09/28/19 0802 09/29/19 0158 09/29/19 1553 09/30/19 0442  WBC 12.3*  --  8.2  --   --   HGB 10.8* 10.5* 8.7* 9.5* 8.7*  HCT 30.6* 30.1* 25.7* 28.7* 26.0*  MCV 89.5  --  89.5  --   --   PLT 183  --  94*  --   --    Cardiac Enzymes: No results for input(s): CKTOTAL, CKMB, CKMBINDEX, TROPONINI in the last 168 hours. BNP: Invalid input(s): POCBNP CBG: No results for input(s): GLUCAP in the last 168 hours. D-Dimer No results for input(s): DDIMER in the last 72 hours. Hgb A1c No  results for input(s): HGBA1C in the last 72 hours. Lipid Profile No results for input(s): CHOL, HDL, LDLCALC, TRIG, CHOLHDL, LDLDIRECT in the last 72 hours. Thyroid function studies No results for input(s): TSH, T4TOTAL, T3FREE, THYROIDAB in the last 72 hours.  Invalid input(s): FREET3 Anemia work up No results for input(s): VITAMINB12, FOLATE, FERRITIN, TIBC, IRON, RETICCTPCT in the last 72 hours. Urinalysis    Component Value Date/Time   COLORURINE YELLOW 08/26/2019 1015   APPEARANCEUR CLEAR 08/26/2019 1015   LABSPEC 1.015 08/26/2019 1015   PHURINE 6.0 08/26/2019 1015   GLUCOSEU NEGATIVE 08/26/2019 1015   HGBUR TRACE-INTACT (A) 08/26/2019 1015   BILIRUBINUR NEGATIVE 08/26/2019 1015   BILIRUBINUR negative 08/26/2015 0858   KETONESUR NEGATIVE 08/26/2019 1015   PROTEINUR NEGATIVE 06/28/2017 1245   UROBILINOGEN 0.2 08/26/2019 1015   NITRITE NEGATIVE 08/26/2019 1015   LEUKOCYTESUR NEGATIVE 08/26/2019 1015   Sepsis Labs Invalid input(s): PROCALCITONIN,  WBC,  LACTICIDVEN Microbiology Recent Results (from the past 240 hour(s))  SARS Coronavirus 2 by RT PCR (hospital order, performed in [facility] hospital lab) Nasopharyngeal Nasopharyngeal Swab     Status: None   Collection Time: 09/28/19  1:38 AM   Specimen: Nasopharyngeal Swab  Result Value Ref Range Status   SARS Coronavirus 2 NEGATIVE NEGATIVE Final    Comment: (NOTE) SARS-CoV-2 target nucleic acids are NOT DETECTED.  The SARS-CoV-2 RNA is generally detectable in upper and lower respiratory specimens during the acute phase of infection. The lowest concentration of SARS-CoV-2 viral copies this assay can detect is 250 copies / mL. A negative result does not preclude SARS-CoV-2 infection and should not be used as the sole basis for treatment or other patient management decisions.  A negative result may occur with improper specimen collection / handling, submission of specimen other than nasopharyngeal swab, presence of viral  mutation(s) within the areas targeted by this assay, and inadequate number of viral copies (<250 copies / mL). A negative result must be combined with clinical observations, patient history, and epidemiological information.  Fact Sheet for Patients:   StrictlyIdeas.no  Fact Sheet for Healthcare Providers: BankingDealers.co.za  This test is not yet approved or  cleared by the [place]  FDA and has been authorized for detection and/or diagnosis of SARS-CoV-2 by FDA under an Emergency Use Authorization (EUA).  This EUA will remain in effect (meaning this test can be used) for the duration of the COVID-19 declaration under Section 564(b)(1) of the Act, 21 U.S.C. section 360bbb-3(b)(1), unless the authorization is terminated or revoked sooner.  Performed at [facility], [place] [address], [place], [place] [address]     Procedures/Studies: DG Chest 2 View  Result Date: 09/27/2019 CLINICAL DATA:  Shortness of breath EXAM: CHEST - 2 VIEW COMPARISON:  06/13/2017 FINDINGS: The heart size and mediastinal contours are within normal limits. Both lungs are clear. Disc degenerative disease of the thoracic spine. IMPRESSION: No acute abnormality of the lungs. Electronically Signed   By: Eddie Candle M.D.   On: 09/27/2019 20:05   CT Head Wo Contrast  Result Date: 09/27/2019 CLINICAL DATA:  Dizziness EXAM: CT HEAD WITHOUT CONTRAST TECHNIQUE: Contiguous axial images were obtained from the base of the skull through the vertex without intravenous contrast. COMPARISON:  05/13/2014 FINDINGS: Brain: Old infarct in the right cerebellum. There is atrophy and chronic small vessel disease changes. No acute intracranial abnormality. Specifically, no hemorrhage, hydrocephalus, mass lesion, acute infarction, or significant intracranial injury. Vascular: No hyperdense vessel or unexpected calcification. Skull: No acute calvarial abnormality.  Sinuses/Orbits: Visualized paranasal sinuses and mastoids clear. Orbital soft tissues unremarkable. Other: None IMPRESSION: Old right cerebellar infarct. Atrophy, chronic microvascular disease. No acute intracranial abnormality. Electronically Signed   By: Rolm Baptise M.D.   On: 09/27/2019 20:29    Time coordinating discharge: Over 30 minutes  SIGNED:   Guilford Shi, MD  [facility] 09/30/2019, 1:11 PM

## 2019-09-30 NOTE — Plan of Care (Signed)
Instructions were reviewed with patient. All questions were answered. Patient was transported to main entrance by wheelchair. ° °

## 2019-09-30 NOTE — Progress Notes (Signed)
Progress Note  CC:    GI bleed      ASSESSMENT AND PLAN:    # Melena in setting of supra therapeutic INR of 9.5.  --INR down to 1.2 --Hgb fluctuating but is down some overnight from 9.5 to 8.7 . However, melena has resolved, had normal BM this am.  --He is for discharge today. Continue BID PPI x 30 days then qday until seen in clinic.  --Follow up with Ellouise Newer, P.A on 11/10/19 at 2pm      # Dysphagia, improved after starting PPI  # Submucosal nodule in duodenum.  --May need EUS, can discuss further at follow up visit.     SUBJECTIVE    He feels okay today. No specific complaints. No further melena, normal BM today   OBJECTIVE:    EGD 09/29/19 -Benign-appearing esophageal stenosis which appears non-obstructive. - 3 cm hiatal hernia. - Gastritis. Biopsied. - Submucosal nodule found in the duodenum. Biopsied. - Duodenitis. - Normal second portion of the duodenum.  Return patient to hospital ward for ongoing care. - Advance diet as tolerated. - Continue present medications including BID PPI. Can resume warfarin with close attention to INR and hemoglobin. I expect the recent melena and acute anemia are most related to the supratherapeutic INR now corrected. - Await pathology results. - Consider EUS as an outpatient to fully characterize the submucosal nodule in the duodenal bulb. - Outpatient followup with Dr. Ardis Hughs.   Vital signs in last 24 hours: Temp:  [97.7 F (36.5 C)-98.9 F (37.2 C)] 98.2 F (36.8 C) (08/10 0539) Pulse Rate:  [72-94] 75 (08/10 0539) Resp:  [16-26] 18 (08/10 0539) BP: (93-138)/(46-78) 112/66 (08/10 0539) SpO2:  [98 %-100 %] 100 % (08/10 0539) Weight:  [77.1 kg] 77.1 kg (08/09 1136) Last BM Date: 09/29/19 General:   Alert, in NAD Heart:  Regular rate and rhythm.  No lower extremity edema   Pulm: Normal respiratory effort   Abdomen:  Soft,  nontender, nondistended.  Normal bowel sounds.          Neurologic:  Alert and   oriented,  grossly normal neurologically. Psych:  Pleasant, cooperative.  Normal mood and affect.   Intake/Output from previous day: 08/09 0701 - 08/10 0700 In: 2258.8 [P.O.:1440; I.V.:818.8] Out: 2400 [Urine:2400] Intake/Output this shift: No intake/output data recorded.  Lab Results: Recent Labs    09/27/19 1935 09/28/19 0802 09/29/19 0158 09/29/19 1553 09/30/19 0442  WBC 12.3*  --  8.2  --   --   HGB 10.8*   < > 8.7* 9.5* 8.7*  HCT 30.6*   < > 25.7* 28.7* 26.0*  PLT 183  --  94*  --   --    < > = values in this interval not displayed.   BMET Recent Labs    09/27/19 1935 09/29/19 0158 09/30/19 0826  NA 132* 134* 141  K 4.4 3.6 3.7  CL 98 109 113*  CO2 15* 15* 20*  GLUCOSE 206* 98 95  BUN 57* 40* 25*  CREATININE 3.98* 2.32* 2.02*  CALCIUM 7.4* 7.3* 8.1*   LFT No results for input(s): PROT, ALBUMIN, AST, ALT, ALKPHOS, BILITOT, BILIDIR, IBILI in the last 72 hours. PT/INR Recent Labs    09/28/19 0802 09/29/19 0158  LABPROT 22.7* 15.0  INR 2.1* 1.2       Active Problems:   Hyperlipidemia   Essential hypertension   GERD   COPD (chronic obstructive pulmonary disease) (H[facility])   BPH (benign prostatic hyperplasia)  Coagulopathy ([place])   Hiatal hernia   Gastritis and gastroduodenitis   Duodenal nodule     LOS: 2 days   Tye Savoy ,NP 09/30/2019, 9:58 AM

## 2019-10-01 ENCOUNTER — Encounter: Payer: Self-pay | Admitting: Internal Medicine

## 2019-10-01 ENCOUNTER — Telehealth: Payer: Self-pay | Admitting: *Deleted

## 2019-10-01 LAB — SURGICAL PATHOLOGY

## 2019-10-01 NOTE — Telephone Encounter (Signed)
Pt was on TCM report admitted 09/27/19 for hospitalist service and management for supratherapeutic INR 9.5 and melanotic stool.,FOBT positive. Pt underwent EGD 8/9 with findings of gastritis and duodenitis. Also noted was a benign-appearing esophageal stenosis which appears non-obstructive,3 cm hiatal herniaand duodenal submucosal nodulewhich was biopsied.Await H. pylori report. Pt D/C 09/30/19, and will follow-up with GASTROENTEROLOGY.Marland Kitchen/LMB

## 2019-10-02 ENCOUNTER — Ambulatory Visit: Payer: Self-pay | Admitting: General Practice

## 2019-10-02 ENCOUNTER — Ambulatory Visit: Payer: Medicare Other

## 2019-10-06 ENCOUNTER — Telehealth: Payer: Self-pay | Admitting: Physician Assistant

## 2019-10-06 ENCOUNTER — Other Ambulatory Visit: Payer: Self-pay

## 2019-10-06 MED ORDER — BIS SUBCIT-METRONID-TETRACYC 140-125-125 MG PO CAPS: 3.0000 | ORAL_CAPSULE | Freq: Three times a day (TID) | ORAL | 0 refills | Status: DC

## 2019-10-06 MED ORDER — PANTOPRAZOLE SODIUM 40 MG PO TBEC
40.0000 mg | DELAYED_RELEASE_TABLET | Freq: Two times a day (BID) | ORAL | 0 refills | Status: DC
Start: 2019-10-06 — End: 2019-12-24

## 2019-10-06 NOTE — Telephone Encounter (Signed)
Advised that it's okay to dispense, that he would be notified to make sure that he keeps his follow up with the coumadin clinic.

## 2019-10-06 NOTE — Telephone Encounter (Signed)
Charles [name] from [facility] is wanting to inform the provider that the pt's pylera interacts with the pt's warfarin medication, she wants to ensure if the medication could still be dispensed.

## 2019-10-07 ENCOUNTER — Ambulatory Visit: Payer: Medicare Other

## 2019-10-22 ENCOUNTER — Other Ambulatory Visit: Payer: Self-pay

## 2019-10-22 ENCOUNTER — Ambulatory Visit (INDEPENDENT_AMBULATORY_CARE_PROVIDER_SITE_OTHER): Payer: Medicare Other | Admitting: Family

## 2019-10-22 VITALS — BP 124/76 | HR 75 | Temp 98.3°F | Ht 76.0 in | Wt 167.2 lb

## 2019-10-22 DIAGNOSIS — N179 Acute kidney failure, unspecified: Secondary | ICD-10-CM

## 2019-10-22 DIAGNOSIS — L298 Other pruritus: Secondary | ICD-10-CM | POA: Diagnosis not present

## 2019-10-22 DIAGNOSIS — K922 Gastrointestinal hemorrhage, unspecified: Secondary | ICD-10-CM

## 2019-10-22 DIAGNOSIS — T50905A Adverse effect of unspecified drugs, medicaments and biological substances, initial encounter: Secondary | ICD-10-CM | POA: Diagnosis not present

## 2019-10-22 LAB — CBC WITH DIFFERENTIAL/PLATELET
Absolute Monocytes: 752 cells/uL (ref 200–950)
Basophils Absolute: 40 cells/uL (ref 0–200)
Basophils Relative: 1 %
Eosinophils Absolute: 108 cells/uL (ref 15–500)
Eosinophils Relative: 2.7 %
HCT: 29.8 % — ABNORMAL LOW (ref 38.5–50.0)
Hemoglobin: 9.5 g/dL — ABNORMAL LOW (ref 13.2–17.1)
Lymphs Abs: 1464 cells/uL (ref 850–3900)
MCH: 30 pg (ref 27.0–33.0)
MCHC: 31.9 g/dL — ABNORMAL LOW (ref 32.0–36.0)
MCV: 94 fL (ref 80.0–100.0)
MPV: 9.7 fL (ref 7.5–12.5)
Monocytes Relative: 18.8 %
Neutro Abs: 1636 cells/uL (ref 1500–7800)
Neutrophils Relative %: 40.9 %
Platelets: 242 10*3/uL (ref 140–400)
RBC: 3.17 10*6/uL — ABNORMAL LOW (ref 4.20–5.80)
RDW: 14.5 % (ref 11.0–15.0)
Total Lymphocyte: 36.6 %
WBC: 4 10*3/uL (ref 3.8–10.8)

## 2019-10-22 LAB — COMPREHENSIVE METABOLIC PANEL
AG Ratio: 1.3 (calc) (ref 1.0–2.5)
ALT: 7 U/L — ABNORMAL LOW (ref 9–46)
AST: 13 U/L (ref 10–35)
Albumin: 3.5 g/dL — ABNORMAL LOW (ref 3.6–5.1)
Alkaline phosphatase (APISO): 32 U/L — ABNORMAL LOW (ref 35–144)
BUN/Creatinine Ratio: 10 (calc) (ref 6–22)
BUN: 24 mg/dL (ref 7–25)
CO2: 22 mmol/L (ref 20–32)
Calcium: 8.5 mg/dL — ABNORMAL LOW (ref 8.6–10.3)
Chloride: 113 mmol/L — ABNORMAL HIGH (ref 98–110)
Creat: 2.34 mg/dL — ABNORMAL HIGH (ref 0.70–1.25)
Globulin: 2.7 g/dL (calc) (ref 1.9–3.7)
Glucose, Bld: 93 mg/dL (ref 65–99)
Potassium: 4 mmol/L (ref 3.5–5.3)
Sodium: 141 mmol/L (ref 135–146)
Total Bilirubin: 0.4 mg/dL (ref 0.2–1.2)
Total Protein: 6.2 g/dL (ref 6.1–8.1)

## 2019-10-22 MED ORDER — HYDROXYZINE HCL 10 MG PO TABS
10.0000 mg | ORAL_TABLET | Freq: Three times a day (TID) | ORAL | 0 refills | Status: DC | PRN
Start: 2019-10-22 — End: 2020-05-03

## 2019-10-22 MED ORDER — FAMOTIDINE 20 MG PO TABS
20.0000 mg | ORAL_TABLET | Freq: Two times a day (BID) | ORAL | 0 refills | Status: DC
Start: 2019-10-22 — End: 2019-12-24

## 2019-10-22 NOTE — Progress Notes (Signed)
Charles [name] is a 69 y.o. male with the following history as recorded in EpicCare:  Patient Active Problem List   Diagnosis Date Noted  . Acute upper GI bleed 09/30/2019  . Hiatal hernia   . Gastritis and gastroduodenitis   . Duodenal nodule   . Coagulopathy ([place]) 09/28/2019  . Hypokalemia 09/06/2019  . Dysphagia 09/05/2019  . Multiple contusions 12/11/2018  . Vitamin D deficiency 12/11/2018  . History of DVT (deep vein thrombosis) 11/25/2018  . Primary osteoarthritis of left knee 02/01/2018  . Insomnia 07/13/2017  . ATN (acute tubular necrosis) ([place])   . Melena   . Balance problems 06/13/2017  . Acute hearing loss, right 06/13/2017  . Dehydration 06/13/2017  . Symptomatic anemia 06/13/2017  . Hx of pulmonary embolus 06/13/2017  . Thrombocytopenia ([place]) 06/13/2017  . Alcohol abuse 06/13/2017  . AKI (acute kidney injury) ([place]) 06/13/2017  . Ear pain, bilateral 08/24/2016  . Lung blebs ([place]) 07/31/2016  . Acute DVT (deep venous thrombosis) ([place]) 07/25/2016  . Pneumothorax on right 07/23/2016  . CKD (chronic kidney disease), stage III 07/23/2016  . Gross hematuria 04/25/2016  . UTI (urinary tract infection) 08/26/2015  . Bilateral hearing loss 08/26/2015  . Coronary artery calcification seen on CT scan 06/01/2015  . Low back pain 06/01/2015  . Hyperglycemia 12/18/2014  . Anemia 05/26/2014  . Cerebellar stroke ([place]) 05/26/2014  . Brain contusion ([place]) 04/23/2014  . Lung nodule 03/09/2014  . Acute pulmonary embolism ([place]) 02/18/2014  . Multiple nodules of lung 02/18/2014  . Cough 12/24/2013  . Allergic rhinitis 12/24/2013  . Other chest pain 12/24/2013  . Increased prostate specific antigen (PSA) velocity 11/20/2013  . Eustachian tube dysfunction 11/20/2013  . Hearing loss on right 11/20/2013  . Serous otitis media 11/15/2013  . Muscle tightness 10/02/2013  . Rotator cuff injury 09/17/2013  . Skin lesion 07/25/2013  . Anal pain 12/18/2012  . Habitual alcohol use  11/17/2012  . Cellulitis and abscess of buttock 11/15/2012  . Cluster headaches 06/07/2012  . BPH (benign prostatic hyperplasia) 11/22/2010  . Abnormal LFTs 11/14/2010  . Right inguinal hernia 09/16/2010  . Nocturia 08/29/2010  . Preventative health care 08/28/2010  . FREQUENCY, URINARY 12/24/2009  . COPD (chronic obstructive pulmonary disease) ([place]) 05/12/2009  . ABNORMAL ELECTROCARDIOGRAM 11/02/2008  . Solitary pulmonary nodule 05/14/2008  . Cramp of limb 07/19/2007  . Diverticulosis of colon 03/07/2007  . Hyperlipidemia 10/23/2006  . 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/23/2006  . Essential hypertension 10/23/2006  . Unspecified Peripheral Vascular Disease 10/23/2006  . Hemorrhoids 10/23/2006  . ABSCESS, LUNG 10/23/2006  . GERD 10/23/2006    Current Outpatient Medications  Medication Sig Dispense Refill  . apixaban (ELIQUIS) 2.5 MG TABS tablet Take 1 tablet (2.5 mg total) by mouth 2 (two) times daily. 120 tablet 0  . albuterol (VENTOLIN HFA) 108 (90 Base) MCG/ACT inhaler Inhale 2 puffs into the lungs every 6 (six) hours as needed for wheezing or shortness of breath. (Patient not taking: Reported on 09/27/2019) 8 g 5  . amLODipine (NORVASC) 10 MG tablet Take 1 tablet (10 mg total) by mouth daily. 90 tablet 3  . ezetimibe (ZETIA) 10 MG tablet Take 1 tablet (10 mg total) by mouth daily. Annual appt due in Oct must see provider for future refills (Patient taking differently: Take 10 mg by mouth daily. ) 90 tablet 3  . famotidine (PEPCID) 20 MG tablet Take 1 tablet (20 mg total) by mouth 2 (two) times daily. 20 tablet 0  . hydrOXYzine (ATARAX/VISTARIL)  10 MG tablet Take 1 tablet (10 mg total) by mouth 3 (three) times daily as needed for itching. 30 tablet 0  . pantoprazole (PROTONIX) 40 MG tablet Take 1 tablet (40 mg total) by mouth 2 (two) times daily. 20 tablet 0  . rosuvastatin (CRESTOR) 40 MG tablet Take 1 tablet by mouth once daily 90 tablet 0  . tadalafil (CIALIS) 20 MG tablet TAKE 1 TABLET BY  MOUTH ONCE DAILY AS NEEDED FOR ERECTILE DYSFUNCTION (Patient taking differently: Take 20 mg by mouth daily as needed for erectile dysfunction. ) 6 tablet 11  . temazepam (RESTORIL) 15 MG capsule TAKE 1 TO 2 CAPSULES BY MOUTH AT BEDTIME AS NEEDED FOR SLEEP (Patient taking differently: Take 15-30 mg by mouth at bedtime as needed for sleep. ) 60 capsule 5   No current facility-administered medications for this visit.    Allergies: Pylera [bis subcit-metronid-tetracyc]  Past Medical History:  Diagnosis Date  . ABNORMAL ELECTROCARDIOGRAM 11/02/2008  . ABSCESS, LUNG 10/23/2006  . Anemia 05/26/2014  . BACTERIAL PNEUMONIA 12/28/2009  . BPH (benign prostatic hyperplasia) 11/22/2010  . Cerebellar stroke ([place]) 05/26/2014  . CHEST PAIN 12/24/2009  . Coronary artery calcification seen on CT scan 06/01/2015  . Cramp of limb 07/19/2007  . DIVERTICULOSIS, COLON 03/07/2007  . DVT (deep venous thrombosis) ([place]) 01/2014   LLE  . Emphysema of lung ([place])   . 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/23/2006  . FLANK PAIN, LEFT 02/25/2010  . FREQUENCY, URINARY 12/24/2009  . HEMORRHOIDS 10/23/2006  . HYPERLIPIDEMIA 10/23/2006  . HYPERTENSION 10/23/2006  . PE (pulmonary embolism) 02/2014  . PLANTAR FASCIITIS, LEFT 11/02/2008  . Pneumonia 12/2013 X 2  . PSA, INCREASED 11/20/2007  . PULMONARY NODULE 05/14/2008  . Unspecified Peripheral Vascular Disease 10/23/2006    Past Surgical History:  Procedure Laterality Date  . BIOPSY  09/29/2019   Procedure: BIOPSY;  Surgeon: Jerene Bears, MD;  [facility];  Service: Endoscopy;;  . ESOPHAGOGASTRODUODENOSCOPY (EGD) WITH PROPOFOL N/A 06/15/2017   Procedure: ESOPHAGOGASTRODUODENOSCOPY (EGD) WITH PROPOFOL;  Surgeon: Jerene Bears, MD;  [facility];  Service: Gastroenterology;  Laterality: N/A;  . ESOPHAGOGASTRODUODENOSCOPY (EGD) WITH PROPOFOL N/A 09/29/2019   Procedure: ESOPHAGOGASTRODUODENOSCOPY (EGD) WITH PROPOFOL;  Surgeon: Jerene Bears, MD;  [facility];  Service: Endoscopy;   Laterality: N/A;  . HEMORRHOID SURGERY  ?1990's  . INGUINAL HERNIA REPAIR Bilateral ?2000's  . IVC FILTER INSERTION N/A 10/03/2016   Procedure: IVC Filter Retrieveal;  Surgeon: Serafina Mitchell, MD;  Location: [facility];  Service: Cardiovascular;  Laterality: N/A;  . MYRINGOTOMY WITH TUBE PLACEMENT Right 06/26/2018  . SHOULDER ARTHROSCOPY W/ ROTATOR CUFF REPAIR Right 11/2013  . STAPLING OF BLEBS Right 07/31/2016   Procedure: BLEBECTOMY;  Surgeon: Melrose Nakayama, MD;  Location: [facility];  Service: Thoracic;  Laterality: Right;  . VENA CAVA FILTER PLACEMENT Right 08/04/2016   Procedure: INSERTION VENA-CAVA FILTER;  Surgeon: Melrose Nakayama, MD;  Location: [facility];  Service: Thoracic;  Laterality: Right;  Marland Kitchen VIDEO ASSISTED THORACOSCOPY Right 07/31/2016   Procedure: VIDEO ASSISTED THORACOSCOPY;  Surgeon: Melrose Nakayama, MD;  Location: [facility];  Service: Thoracic;  Laterality: Right;  Marland Kitchen VIDEO ASSISTED THORACOSCOPY Right 08/04/2016   Procedure: REDO VIDEO ASSISTED THORACOSCOPY- EVACUATION OF HEMOTHORAX;  Surgeon: Melrose Nakayama, MD;  Location: [facility];  Service: Thoracic;  Laterality: Right;  Marland Kitchen VIDEO BRONCHOSCOPY WITH ENDOBRONCHIAL NAVIGATION N/A 03/11/2014   Procedure: VIDEO BRONCHOSCOPY WITH ENDOBRONCHIAL NAVIGATION;  Surgeon: Collene Gobble, MD;  [facility];  Service: Thoracic;  Laterality: N/A;    Family History  Problem Relation Age of Onset  . Heart disease Mother   . Heart disease Father   . Kidney disease Sister        Renal transplant  . Colon cancer Neg Hx   . Rectal cancer Neg Hx     Social History   Tobacco Use  . Smoking status: Former Smoker    Packs/day: 1.00    Years: 15.00    Pack years: 15.00    Types: Cigarettes  . Smokeless tobacco: Never Used  . Tobacco comment: "quit smoking cigarettes in the 1990's"  Substance Use Topics  . Alcohol use: Yes    Alcohol/week: 0.0 standard drinks    Comment: patient states he stopped 4-5 days ago     Subjective:  Patient was being treated for H. Pylori infection with Pylera x 10 days; notes that while on the medication he was "itching all over." Finished the medication by Friday and notes that everyday since symptoms have improved;   Does need to have labs checked from recent hospitalization as well including CBC, CMP; had GI bleed and secondary AKI while in the hospital;     Objective:  Vitals:   10/22/19 0851  BP: 124/76  Pulse: 75  Temp: 98.3 F (36.8 C)  SpO2: 99%  Weight: 167 lb 3.2 oz (75.8 kg)  Height: _0  (1.93 m)    General: Well developed, well nourished, in no acute distress  Skin : Warm and dry.  Head: Normocephalic and atraumatic  Lungs: Respirations unlabored;  Musculoskeletal: No deformities; no active joint inflammation  Extremities: No edema, cyanosis, clubbing  Vessels: Symmetric bilaterally  Neurologic: Alert and oriented; speech intact; face symmetrical; moves all extremities well; CNII-XII intact without focal deficit   Assessment:  1. Itching due to drug   2. Acute upper GI bleed   3. AKI (acute kidney injury) ([place])     Plan:  1. Suspect allergic reaction to Pylera; symptoms have improved as he has been off the medication; notes almost completely symptom free today; Rx for Atarax and Pepcid to use as needed; 2. Check CBC today; keep planned followup with GI; 3. Check CMP to make sure kidney functions have continued to normalize;  This visit occurred during the SARS-CoV-2 public health emergency.  Safety protocols were in place, including screening questions prior to the visit, additional usage of staff PPE, and extensive cleaning of exam room while observing appropriate contact time as indicated for disinfecting solutions.     No follow-ups on file.  Orders Placed This Encounter  Procedures  . CBC with Differential/Platelet    Standing Status:   Future    Standing Expiration Date:   10/21/2020  . Comp Met (CMET)    Standing Status:   Future     Standing Expiration Date:   10/21/2020    Requested Prescriptions   Signed Prescriptions Disp Refills  . hydrOXYzine (ATARAX/VISTARIL) 10 MG tablet 30 tablet 0    Sig: Take 1 tablet (10 mg total) by mouth 3 (three) times daily as needed for itching.  . famotidine (PEPCID) 20 MG tablet 20 tablet 0    Sig: Take 1 tablet (20 mg total) by mouth 2 (two) times daily.

## 2019-10-22 NOTE — Addendum Note (Signed)
Addended by: Cresenciano Lick on: 10/22/2019 09:14 AM   Modules accepted: Orders

## 2019-11-06 ENCOUNTER — Other Ambulatory Visit: Payer: Self-pay | Admitting: Internal Medicine

## 2019-11-10 ENCOUNTER — Encounter: Payer: Self-pay | Admitting: Physician Assistant

## 2019-11-10 ENCOUNTER — Ambulatory Visit (INDEPENDENT_AMBULATORY_CARE_PROVIDER_SITE_OTHER): Payer: Medicare Other | Admitting: Physician Assistant

## 2019-11-10 VITALS — BP 110/68 | HR 70 | Ht 76.0 in | Wt 180.5 lb

## 2019-11-10 DIAGNOSIS — Z8719 Personal history of other diseases of the digestive system: Secondary | ICD-10-CM | POA: Diagnosis not present

## 2019-11-10 NOTE — Patient Instructions (Signed)
If you are age 66 or older, your body mass index should be between 23-30. Your Body mass index is 21.97 kg/m. If this is out of the aforementioned range listed, please consider follow up with your Primary Care Provider.  If you are age 57 or younger, your body mass index should be between 19-25. Your Body mass index is 21.97 kg/m. If this is out of the aformentioned range listed, please consider follow up with your Primary Care Provider.   Charles Newer, PA-C will talk with Dr. Ardis Hughs about your case to decide what is next.   We will contact you about a follow up or as needed.

## 2019-11-10 NOTE — Progress Notes (Signed)
I agree with the above note plan.   I looked at Dr. Vena Rua EGD images.  I think EUS is a good next step for evaluation.  Should be my next available [facility] Thursday, thanks

## 2019-11-10 NOTE — Progress Notes (Signed)
Chief Complaint: Follow-up hospitalization for melena  HPI:    Charles [name] is a 69 year old African-American male with a past medical history as listed below including emphysema, HLD, hypertension, CVA, DVT and PE on Coumadin, known to Dr. Ardis Hughs, who was referred to me by Biagio Borg, MD for follow-up after recent hospitalization for melena.      09/28/2019 patient consulted by our service for dizziness, weakness and dark stool.  INR was 9.5 at that time hemoglobin 10.8 (normally 13-14), heme positive stool.  At that time plans were to normalize INR and continue twice daily PPI.  09/29/2019 patient had an EGD with benign-appearing esophageal stenosis, 3 cm hiatal hernia, gastritis and a submucosal nodule found in the duodenum as well as duodenitis.  Was thought melena was related to supratherapeutic INR.  Is recommended to consider EUS as an outpatient to fully characterize submucosal nodule in duodenal bulb.    10/22/2019 CBC with a hemoglobin of 9.5 (8.7 on 09/30/2019).    Today, the patient tells me he feels well.  He has no complaints.  They switched him from his Coumadin to Eliquis and he has been happy with this.  Denies any abdominal pain or further melena.  Tells me his hemoglobin is going back up.  He continues Pantoprazole 40 mg twice daily.    Denies fever, chills or weight loss.  GI history: 10/21/2018 colonoscopy Dr. Ardis Hughs: - Four 3 to 4 mm polyps in the descending colon, in the transverse colon, in the ascending colon and in the cecum, removed with a cold snare. Resected and retrieved. - One 12 mm polyp in the cecum, removed with a hot snare. Resected and retrieved. - Diverticulosis in the entire examined colon. - External and internal hemorrhoids. - The examination was otherwise normal on direct and retroflexion views. -  Pathology showed tubular adenomas repeat recommended in 3 years  06/15/2017 EGD by Dr. Hilarie Fredrickson in the hospital for melena: Normal other than 1 cm hiatal  hernia  Past Medical History:  Diagnosis Date  . ABNORMAL ELECTROCARDIOGRAM 11/02/2008  . ABSCESS, LUNG 10/23/2006  . Anemia 05/26/2014  . BACTERIAL PNEUMONIA 12/28/2009  . BPH (benign prostatic hyperplasia) 11/22/2010  . Cerebellar stroke ([place]) 05/26/2014  . CHEST PAIN 12/24/2009  . Coronary artery calcification seen on CT scan 06/01/2015  . Cramp of limb 07/19/2007  . DIVERTICULOSIS, COLON 03/07/2007  . DVT (deep venous thrombosis) ([place]) 01/2014   LLE  . Emphysema of lung ([place])   . 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/23/2006  . FLANK PAIN, LEFT 02/25/2010  . FREQUENCY, URINARY 12/24/2009  . HEMORRHOIDS 10/23/2006  . HYPERLIPIDEMIA 10/23/2006  . HYPERTENSION 10/23/2006  . PE (pulmonary embolism) 02/2014  . PLANTAR FASCIITIS, LEFT 11/02/2008  . Pneumonia 12/2013 X 2  . PSA, INCREASED 11/20/2007  . PULMONARY NODULE 05/14/2008  . Unspecified Peripheral Vascular Disease 10/23/2006    Past Surgical History:  Procedure Laterality Date  . BIOPSY  09/29/2019   Procedure: BIOPSY;  Surgeon: Jerene Bears, MD;  [facility];  Service: Endoscopy;;  . ESOPHAGOGASTRODUODENOSCOPY (EGD) WITH PROPOFOL N/A 06/15/2017   Procedure: ESOPHAGOGASTRODUODENOSCOPY (EGD) WITH PROPOFOL;  Surgeon: Jerene Bears, MD;  [facility];  Service: Gastroenterology;  Laterality: N/A;  . ESOPHAGOGASTRODUODENOSCOPY (EGD) WITH PROPOFOL N/A 09/29/2019   Procedure: ESOPHAGOGASTRODUODENOSCOPY (EGD) WITH PROPOFOL;  Surgeon: Jerene Bears, MD;  [facility];  Service: Endoscopy;  Laterality: N/A;  . HEMORRHOID SURGERY  ?1990's  . INGUINAL HERNIA REPAIR Bilateral ?2000's  . IVC FILTER INSERTION  N/A 10/03/2016   Procedure: IVC Filter Retrieveal;  Surgeon: Serafina Mitchell, MD;  Location: [facility];  Service: Cardiovascular;  Laterality: N/A;  . MYRINGOTOMY WITH TUBE PLACEMENT Right 06/26/2018  . SHOULDER ARTHROSCOPY W/ ROTATOR CUFF REPAIR Right 11/2013  . STAPLING OF BLEBS Right 07/31/2016   Procedure: BLEBECTOMY;  Surgeon:  Melrose Nakayama, MD;  Location: [facility];  Service: Thoracic;  Laterality: Right;  . VENA CAVA FILTER PLACEMENT Right 08/04/2016   Procedure: INSERTION VENA-CAVA FILTER;  Surgeon: Melrose Nakayama, MD;  Location: [facility];  Service: Thoracic;  Laterality: Right;  Marland Kitchen VIDEO ASSISTED THORACOSCOPY Right 07/31/2016   Procedure: VIDEO ASSISTED THORACOSCOPY;  Surgeon: Melrose Nakayama, MD;  Location: [facility];  Service: Thoracic;  Laterality: Right;  Marland Kitchen VIDEO ASSISTED THORACOSCOPY Right 08/04/2016   Procedure: REDO VIDEO ASSISTED THORACOSCOPY- EVACUATION OF HEMOTHORAX;  Surgeon: Melrose Nakayama, MD;  Location: [facility];  Service: Thoracic;  Laterality: Right;  Marland Kitchen VIDEO BRONCHOSCOPY WITH ENDOBRONCHIAL NAVIGATION N/A 03/11/2014   Procedure: VIDEO BRONCHOSCOPY WITH ENDOBRONCHIAL NAVIGATION;  Surgeon: Collene Gobble, MD;  [facility];  Service: Thoracic;  Laterality: N/A;    Current Outpatient Medications  Medication Sig Dispense Refill  . albuterol (VENTOLIN HFA) 108 (90 Base) MCG/ACT inhaler Inhale 2 puffs into the lungs every 6 (six) hours as needed for wheezing or shortness of breath. (Patient not taking: Reported on 09/27/2019) 8 g 5  . amLODipine (NORVASC) 10 MG tablet Take 1 tablet (10 mg total) by mouth daily. 90 tablet 3  . apixaban (ELIQUIS) 2.5 MG TABS tablet Take 1 tablet (2.5 mg total) by mouth 2 (two) times daily. 120 tablet 0  . ezetimibe (ZETIA) 10 MG tablet Take 1 tablet (10 mg total) by mouth daily. Annual appt due in Oct must see provider for future refills (Patient taking differently: Take 10 mg by mouth daily. ) 90 tablet 3  . famotidine (PEPCID) 20 MG tablet Take 1 tablet (20 mg total) by mouth 2 (two) times daily. 20 tablet 0  . hydrOXYzine (ATARAX/VISTARIL) 10 MG tablet Take 1 tablet (10 mg total) by mouth 3 (three) times daily as needed for itching. 30 tablet 0  . pantoprazole (PROTONIX) 40 MG tablet Take 1 tablet (40 mg total) by mouth 2 (two) times daily. 20 tablet 0  .  rosuvastatin (CRESTOR) 40 MG tablet Take 1 tablet by mouth once daily 90 tablet 0  . tadalafil (CIALIS) 20 MG tablet TAKE 1 TABLET BY MOUTH ONCE DAILY AS NEEDED FOR ERECTILE DYSFUNCTION (Patient taking differently: Take 20 mg by mouth daily as needed for erectile dysfunction. ) 6 tablet 11  . temazepam (RESTORIL) 15 MG capsule TAKE 1 TO 2 CAPSULES BY MOUTH AT BEDTIME AS NEEDED FOR SLEEP (Patient taking differently: Take 15-30 mg by mouth at bedtime as needed for sleep. ) 60 capsule 5  . tiZANidine (ZANAFLEX) 2 MG tablet TAKE 1 TABLET BY MOUTH TWICE DAILY AS NEEDED FOR MUSCLE SPASM 60 tablet 0   No current facility-administered medications for this visit.    Allergies as of 11/10/2019 - Review Complete 10/22/2019  Allergen Reaction Noted  . Pylera [bis subcit-metronid-tetracyc]  10/22/2019    Family History  Problem Relation Age of Onset  . Heart disease Mother   . Heart disease Father   . Kidney disease Sister        Renal transplant  . Colon cancer Neg Hx   . Rectal cancer Neg Hx     Social History  Socioeconomic History  . Marital status: Married    Spouse name: Not on file  . Number of children: 2  . Years of education: Not on file  . Highest education level: Not on file  Occupational History  . Occupation: retired  Tobacco Use  . Smoking status: Former Smoker    Packs/day: 1.00    Years: 15.00    Pack years: 15.00    Types: Cigarettes  . Smokeless tobacco: Never Used  . Tobacco comment: "quit smoking cigarettes in the 1990's"  Vaping Use  . Vaping Use: Never used  Substance and Sexual Activity  . Alcohol use: Yes    Alcohol/week: 0.0 standard drinks    Comment: patient states he stopped 4-5 days ago  . 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 Sexual activity: Yes  Other Topics Concern  . Not on file  Social History Narrative  . Not on file   Social Determinants of Health   Financial Resource Strain:   . Difficulty of Paying  Living Expenses: Not on file  Food Insecurity:   . Worried About Charity fundraiser in the Last Year: Not on file  . Ran Out of Food in the Last Year: Not on file  Transportation Needs:   . Lack of Transportation (Medical): Not on file  . Lack of Transportation (Non-Medical): Not on file  Physical Activity:   . Days of Exercise per Week: Not on file  . Minutes of Exercise per Session: Not on file  Stress:   . Feeling of Stress : Not on file  Social Connections:   . Frequency of Communication with Friends and Family: Not on file  . Frequency of Social Gatherings with Friends and Family: Not on file  . Attends Religious Services: Not on file  . Active Member of Clubs or Organizations: Not on file  . Attends Archivist Meetings: Not on file  . Marital Status: Not on file  Intimate Partner Violence:   . Fear of Current or Ex-Partner: Not on file  . Emotionally Abused: Not on file  . Physically Abused: Not on file  . Sexually Abused: Not on file    Review of Systems:    Constitutional: No weight loss, fever or chills Cardiovascular: No chest pain  Respiratory: No SOB  Gastrointestinal: See HPI and otherwise negative   Physical Exam:  Vital signs: BP 110/68 (BP Location: Right Arm, Patient Position: Sitting, Cuff Size: Normal)   Pulse 70   Ht 6\' 4"  (1.93 m)   Wt 180 lb 8 oz (81.9 kg)   BMI 21.97 kg/m   Constitutional:   Pleasant AA male appears to be in NAD, Well developed, Well nourished, alert and cooperative Respiratory: Respirations even and unlabored. Lungs clear to auscultation bilaterally.   No wheezes, crackles, or rhonchi.  Cardiovascular: Normal S1, S2. No MRG. Regular rate and rhythm. No peripheral edema, cyanosis or pallor.  Gastrointestinal:  Soft, nondistended, nontender. No rebound or guarding. Normal bowel sounds. No appreciable masses or hepatomegaly. Rectal:  Not performed.  Psychiatric: Demonstrates good judgement and reason without abnormal affect  or behaviors.  RELEVANT LABS AND IMAGING: CBC    Component Value Date/Time   WBC 4.0 10/22/2019 0913   RBC 3.17 (L) 10/22/2019 0913   HGB 9.5 (L) 10/22/2019 0913   HCT 29.8 (L) 10/22/2019 0913   PLT 242 10/22/2019 0913   MCV 94.0 10/22/2019 0913   MCH 30.0  10/22/2019 0913   MCHC 31.9 (L) 10/22/2019 0913   RDW 14.5 10/22/2019 0913   LYMPHSABS 1,464 10/22/2019 0913   MONOABS 0.9 08/26/2019 1015   EOSABS 108 10/22/2019 0913   BASOSABS 40 10/22/2019 0913    CMP     Component Value Date/Time   NA 141 10/22/2019 0913   K 4.0 10/22/2019 0913   CL 113 (H) 10/22/2019 0913   CO2 22 10/22/2019 0913   GLUCOSE 93 10/22/2019 0913   BUN 24 10/22/2019 0913   CREATININE 2.34 (H) 10/22/2019 0913   CALCIUM 8.5 (L) 10/22/2019 0913   PROT 6.2 10/22/2019 0913   ALBUMIN 4.2 08/26/2019 1015   AST 13 10/22/2019 0913   ALT 7 (L) 10/22/2019 0913   ALKPHOS 35 (L) 08/26/2019 1015   BILITOT 0.4 10/22/2019 0913   GFRNONAA 33 (L) 09/30/2019 0826   GFRAA 38 (L) 09/30/2019 0826    Assessment: 1.  History of GI bleed: Suspected due to supratherapeutic INR greater than 9, EGD with abnormal submucosal nodule  Plan: 1.  Discussed possible EUS with the patient.  He would like Dr. Ardis Hughs opinion.  If Dr. Ardis Hughs feels it is necessary then he has happy to proceed. 2.  Continue Pantoprazole 40 mg twice daily 3.  Patient to follow in clinic per recommendations from Dr. Ardis Hughs.  Ellouise Newer, PA-C [facility] 11/10/2019, 2:04 PM  Cc: Biagio Borg, MD

## 2019-11-11 ENCOUNTER — Telehealth: Payer: Self-pay

## 2019-11-11 ENCOUNTER — Other Ambulatory Visit: Payer: Self-pay

## 2019-11-11 DIAGNOSIS — K3189 Other diseases of stomach and duodenum: Secondary | ICD-10-CM

## 2019-11-11 NOTE — Telephone Encounter (Signed)
Author: Milus Banister, MD Service: Gastroenterology Author Type: Physician  Filed: 11/10/2019 6:16 PM Encounter Date: 11/10/2019 Status: Signed  Editor: Milus Banister, MD (Physician)     Show:Clear all [x] Manual[] Template[] Copied  Added by: [x] Milus Banister, MD  [] Hover for details I agree with the above note plan.   I looked at Dr. Vena Rua EGD images.  I think EUS is a good next step for evaluation.  Should be my next available [facility] Thursday, thanks

## 2019-11-11 NOTE — Telephone Encounter (Signed)
-----   Message from Levin Erp, [place] sent at 11/11/2019  9:05 AM EDT ----- Regarding: Can you arrange please Needs EUS - please let patient know and schedule Thanks-JLL ----- Message ----- From: Milus Banister, MD Sent: 11/10/2019   6:15 PM EDT To: Levin Erp, PA    ----- Message ----- From: Levin Erp, [place] Sent: 11/10/2019   2:26 PM EDT To: Milus Banister, MD

## 2019-11-11 NOTE — Telephone Encounter (Signed)
Left message on machine to call back  

## 2019-11-11 NOTE — Telephone Encounter (Signed)
EUS scheduled for 12/25/19 at 830 am at [facility] with Dr Ardis Hughs.  COVID test on 12/22/19 at 955 am.  Anti coag letter sent to Dr Jenny Reichmann.

## 2019-11-12 NOTE — Telephone Encounter (Signed)
Left message on machine to call back  

## 2019-11-12 NOTE — Telephone Encounter (Signed)
EUS scheduled, pt instructed and medications reviewed.  Patient instructions mailed to home.  Patient to call with any questions or concerns. The pt is aware that he will be getting a call in regards to his blood thinner.

## 2019-11-15 IMAGING — CR DG CHEST 2V
2 series · 2 of 2 positions shown · non-contrast
Comparison: CT 10/24/2016, radiograph 10/17/2016

CLINICAL DATA: Elevated creatinine with decreased urine output

EXAM:
CHEST - 2 VIEW

[w chest pa]
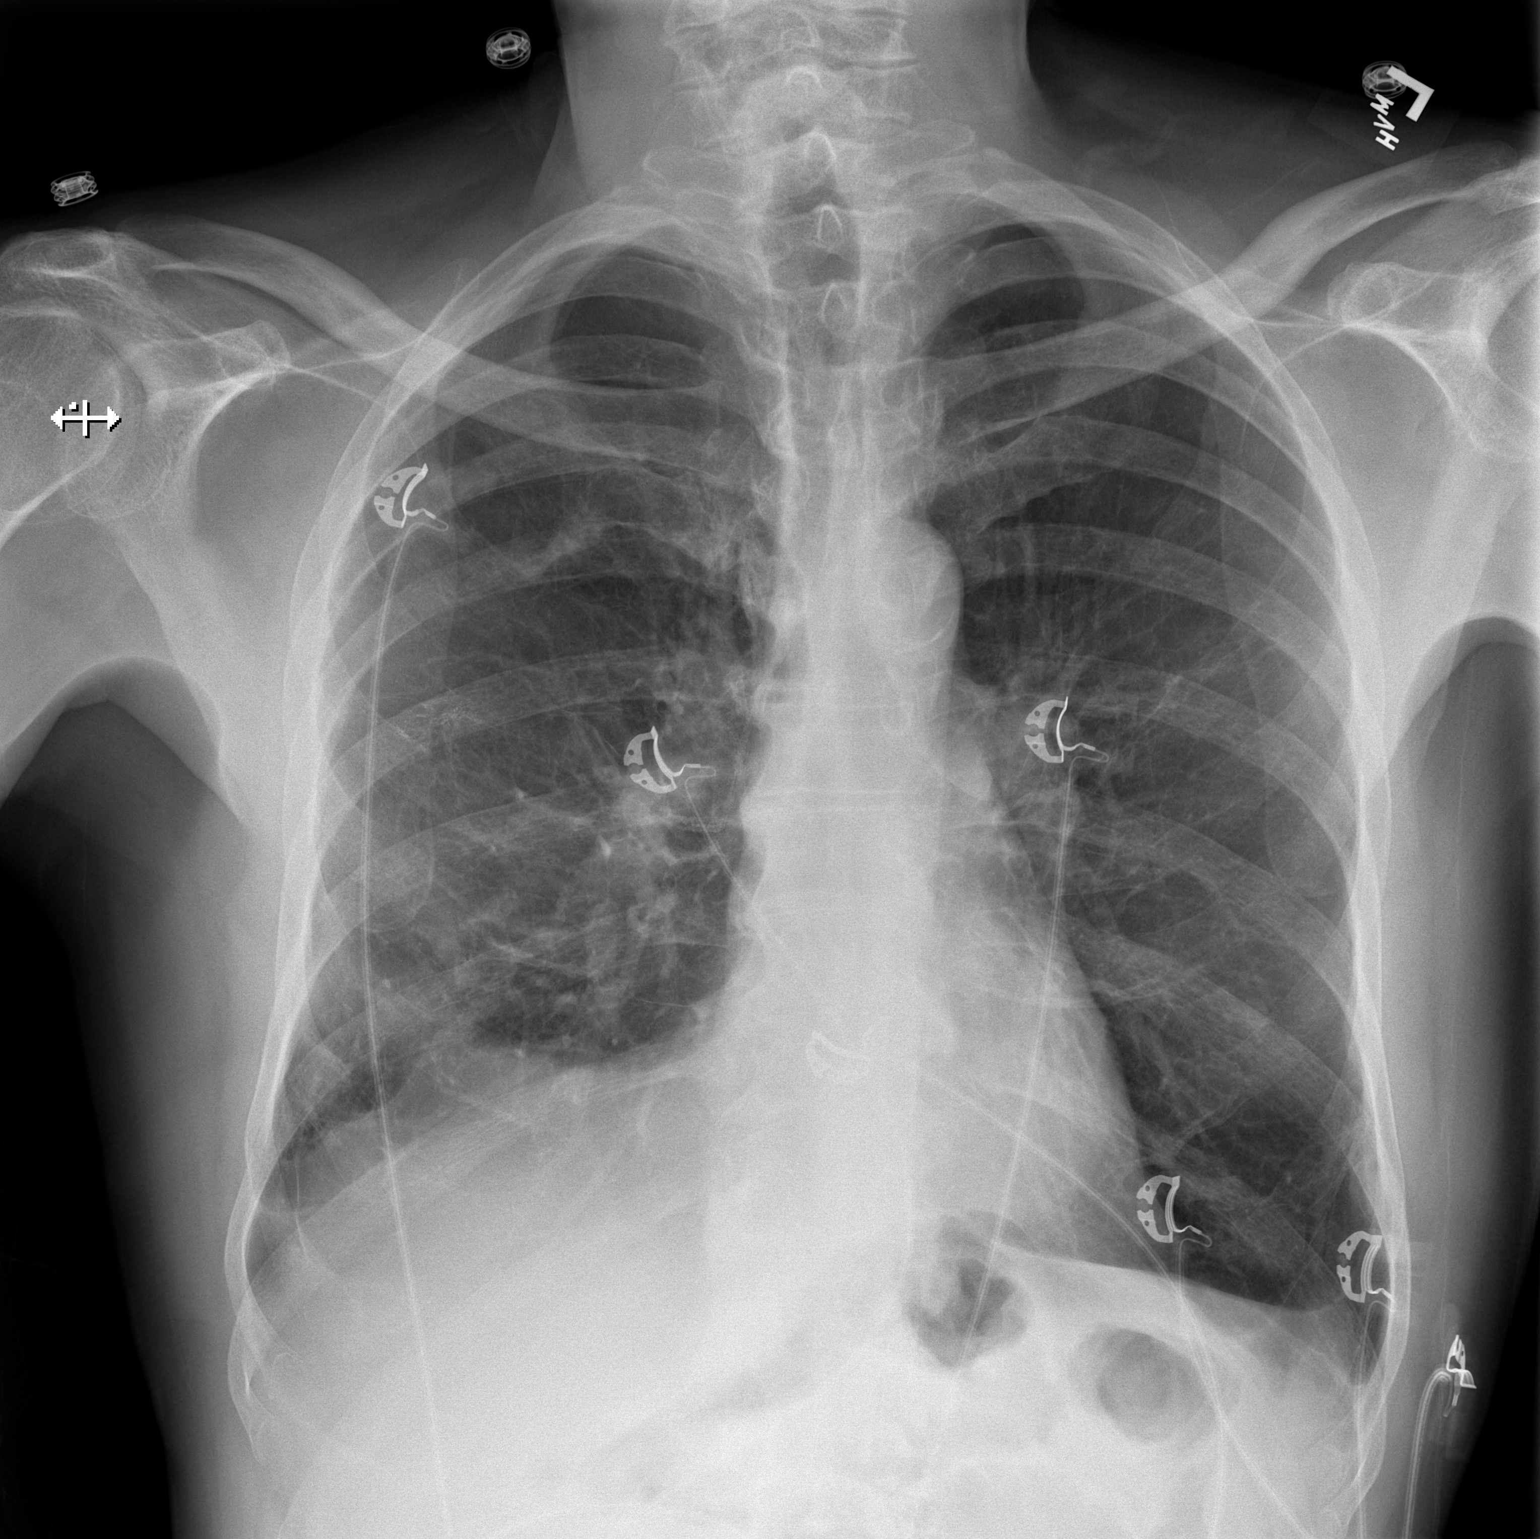

[w chest lat]
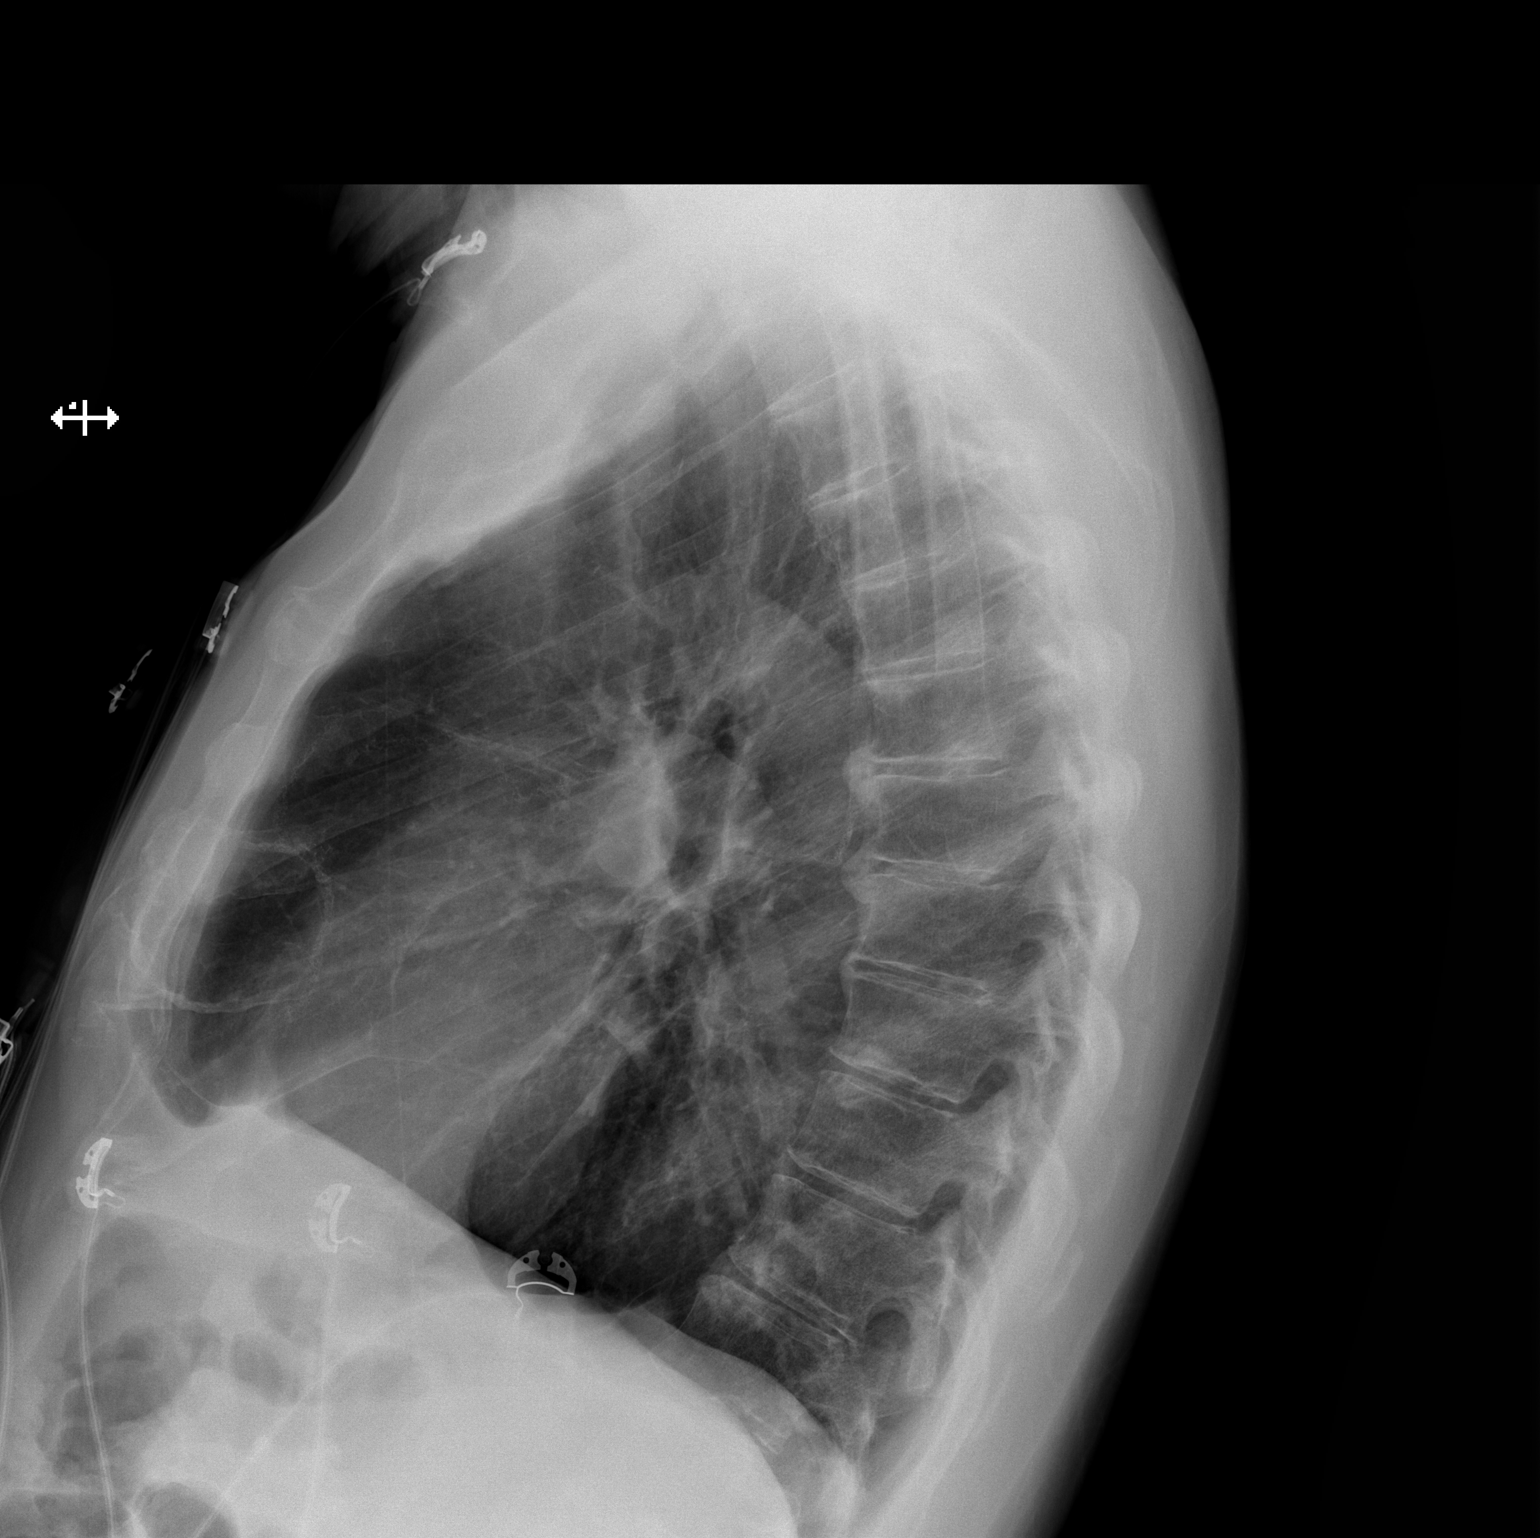

[2 of 2 positions shown; findings below may reference images not displayed]

FINDINGS: Hyperinflation with emphysematous disease. Stable pleural and
parenchymal scarring and surgical changes on the right. Small
nodular suprahilar and upper lung, new since 10/17/2016 radiograph.
Normal cardiomediastinal silhouette with aortic atherosclerosis. No
pneumothorax. Degenerative changes of the spine.
IMPRESSION: No active cardiopulmonary disease. Nodular opacities in the right
upper lung, possible evolution of surgical changes.

## 2019-12-08 ENCOUNTER — Telehealth: Payer: Self-pay | Admitting: Internal Medicine

## 2019-12-08 NOTE — Telephone Encounter (Signed)
[facility] for one month refill only  Please let pt know he missed his 1  week f/u with me after hospital d/c in august 2021 - will need ROV for further refills

## 2019-12-08 NOTE — Telephone Encounter (Signed)
Sent to Dr. John to advise. 

## 2019-12-08 NOTE — Telephone Encounter (Signed)
apixaban (ELIQUIS) 2.5 MG TABS tablet Patient was prescribed this by a provider in the hospital and was wondering if he could get a refill   [place], [address] Phone:  [phone]  Fax:  [phone]     Last seen- 09/05/19 Next apt- 02/26/20

## 2019-12-11 NOTE — Telephone Encounter (Signed)
  Patient calling for status of refill for Eliquis Patient scheduled for 10/28

## 2019-12-12 NOTE — Telephone Encounter (Signed)
LVM for pt stating Dr. Jenny Reichmann has sent in a 56mnth supply of his Eliquis until pt comes in for OV.

## 2019-12-16 ENCOUNTER — Telehealth: Payer: Self-pay | Admitting: Internal Medicine

## 2019-12-16 NOTE — Telephone Encounter (Signed)
LVM for pt to RTN my call to schedule AWV-I with [facility]. Patient is due as of 08/21/18 per [facility].

## 2019-12-17 ENCOUNTER — Ambulatory Visit (INDEPENDENT_AMBULATORY_CARE_PROVIDER_SITE_OTHER): Payer: Medicare Other | Admitting: Otolaryngology

## 2019-12-18 ENCOUNTER — Ambulatory Visit: Payer: Medicare Other | Admitting: Internal Medicine

## 2019-12-19 ENCOUNTER — Telehealth: Payer: Self-pay | Admitting: Internal Medicine

## 2019-12-19 ENCOUNTER — Telehealth: Payer: Self-pay

## 2019-12-19 NOTE — Telephone Encounter (Signed)
Sent to Dr. John. 

## 2019-12-19 NOTE — Telephone Encounter (Signed)
Left message on machine to call back  Also called Dr Jenny Reichmann.

## 2019-12-19 NOTE — Telephone Encounter (Signed)
    [facility]- GI calling to request Eliquis be held starting today for upcoming procedure on 11/04 (UPPER ENDOSCOPIC ULTRASOUND ) GI send ing a fax as well today  Please advise

## 2019-12-19 NOTE — Telephone Encounter (Signed)
Per Dr Judi Cong office note a message was sent to Dr Jenny Reichmann asking for clearance.

## 2019-12-19 NOTE — Telephone Encounter (Signed)
-----   Message from Timothy Lasso, RN sent at 12/17/2019  8:42 AM EDT -----  ----- Message ----- From: Timothy Lasso, RN Sent: 12/17/2019 To: Timothy Lasso, RN   ----- Message ----- From: Timothy Lasso, RN Sent: 12/12/2019 To: Timothy Lasso, RN   ----- Message ----- From: Timothy Lasso, RN Sent: 12/08/2019 To: Timothy Lasso, RN  Waiting for anticoag response from Dr Jenny Reichmann

## 2019-12-22 ENCOUNTER — Other Ambulatory Visit (HOSPITAL_COMMUNITY)
Admission: RE | Admit: 2019-12-22 | Discharge: 2019-12-22 | Disposition: A | Discharge status: Home / Self Care | Payer: Medicare Other | Source: Ambulatory Visit | Attending: Gastroenterology | Admitting: Gastroenterology

## 2019-12-22 DIAGNOSIS — Z01818 Encounter for other preprocedural examination: Secondary | ICD-10-CM | POA: Diagnosis present

## 2019-12-22 DIAGNOSIS — Z20822 Contact with and (suspected) exposure to covid-19: Secondary | ICD-10-CM | POA: Insufficient documentation

## 2019-12-22 LAB — SARS CORONAVIRUS 2 (TAT 6-24 HRS): SARS Coronavirus 2: NEGATIVE

## 2019-12-22 NOTE — Telephone Encounter (Signed)
Verdis Frederickson I have still not heard from Dr Jenny Reichmann about this pt's Eliquis.  Can you please check on this for me? The appt is 11/4 and we need an answer ASAP

## 2019-12-22 NOTE — Telephone Encounter (Signed)
Received the ok from Dr Jenny Reichmann by fax for the pt to hold eliquis for 3 days prior to the appt.  I will have the letter sent to be scanned into Epic.  Left message on machine to call back

## 2019-12-23 NOTE — Telephone Encounter (Signed)
I spoke with the pt and he confirms that he has stopped his eliquis for his upcoming procedure

## 2019-12-23 NOTE — Telephone Encounter (Signed)
Left message on all numbers available to call back

## 2019-12-24 ENCOUNTER — Encounter: Payer: Self-pay | Admitting: Internal Medicine

## 2019-12-24 ENCOUNTER — Other Ambulatory Visit: Payer: Self-pay

## 2019-12-24 ENCOUNTER — Ambulatory Visit (INDEPENDENT_AMBULATORY_CARE_PROVIDER_SITE_OTHER): Payer: Medicare Other | Admitting: Internal Medicine

## 2019-12-24 VITALS — BP 120/60 | HR 89 | Temp 99.1°F | Ht 76.0 in | Wt 174.0 lb

## 2019-12-24 DIAGNOSIS — D631 Anemia in chronic kidney disease: Secondary | ICD-10-CM

## 2019-12-24 DIAGNOSIS — J439 Emphysema, unspecified: Secondary | ICD-10-CM

## 2019-12-24 DIAGNOSIS — Z7901 Long term (current) use of anticoagulants: Secondary | ICD-10-CM

## 2019-12-24 DIAGNOSIS — R739 Hyperglycemia, unspecified: Secondary | ICD-10-CM

## 2019-12-24 DIAGNOSIS — N189 Chronic kidney disease, unspecified: Secondary | ICD-10-CM | POA: Diagnosis not present

## 2019-12-24 DIAGNOSIS — N183 Chronic kidney disease, stage 3 unspecified: Secondary | ICD-10-CM | POA: Diagnosis not present

## 2019-12-24 LAB — CBC WITH DIFFERENTIAL/PLATELET
Basophils Absolute: 0.1 10*3/uL (ref 0.0–0.1)
Basophils Relative: 0.8 % (ref 0.0–3.0)
Eosinophils Absolute: 0.1 10*3/uL (ref 0.0–0.7)
Eosinophils Relative: 0.9 % (ref 0.0–5.0)
HCT: 35.2 % — ABNORMAL LOW (ref 39.0–52.0)
Hemoglobin: 11.5 g/dL — ABNORMAL LOW (ref 13.0–17.0)
Lymphocytes Relative: 22.2 % (ref 12.0–46.0)
Lymphs Abs: 1.6 10*3/uL (ref 0.7–4.0)
MCHC: 32.6 g/dL (ref 30.0–36.0)
MCV: 80.8 fl (ref 78.0–100.0)
Monocytes Absolute: 0.9 10*3/uL (ref 0.1–1.0)
Monocytes Relative: 12.6 % — ABNORMAL HIGH (ref 3.0–12.0)
Neutro Abs: 4.6 10*3/uL (ref 1.4–7.7)
Neutrophils Relative %: 63.5 % (ref 43.0–77.0)
Platelets: 115 10*3/uL — ABNORMAL LOW (ref 150.0–400.0)
RBC: 4.35 Mil/uL (ref 4.22–5.81)
RDW: 20.8 % — ABNORMAL HIGH (ref 11.5–15.5)
WBC: 7.3 10*3/uL (ref 4.0–10.5)

## 2019-12-24 LAB — VITAMIN D 25 HYDROXY (VIT D DEFICIENCY, FRACTURES): VITD: 40.02 ng/mL (ref 30.00–100.00)

## 2019-12-24 LAB — HEPATIC FUNCTION PANEL
ALT: 24 U/L (ref 0–53)
AST: 31 U/L (ref 0–37)
Albumin: 3.9 g/dL (ref 3.5–5.2)
Alkaline Phosphatase: 45 U/L (ref 39–117)
Bilirubin, Direct: 0.1 mg/dL (ref 0.0–0.3)
Total Bilirubin: 0.5 mg/dL (ref 0.2–1.2)
Total Protein: 7.7 g/dL (ref 6.0–8.3)

## 2019-12-24 LAB — BASIC METABOLIC PANEL
BUN: 35 mg/dL — ABNORMAL HIGH (ref 6–23)
CO2: 21 mEq/L (ref 19–32)
Calcium: 8.8 mg/dL (ref 8.4–10.5)
Chloride: 107 mEq/L (ref 96–112)
Creatinine, Ser: 2.3 mg/dL — ABNORMAL HIGH (ref 0.40–1.50)
GFR: 28.7 mL/min — ABNORMAL LOW (ref >60.00)
Glucose, Bld: 102 mg/dL — ABNORMAL HIGH (ref 70–99)
Potassium: 4.4 mEq/L (ref 3.5–5.1)
Sodium: 135 mEq/L (ref 135–145)

## 2019-12-24 LAB — PHOSPHORUS: Phosphorus: 3.5 mg/dL (ref 2.3–4.6)

## 2019-12-24 NOTE — Progress Notes (Signed)
Subjective:    Patient ID: Charles [name], male    DOB: March 21, 1950, 69 y.o.   MRN: [id number]  HPI  Here to f/u , ecg is scheduled tomorrow in f/u, has been off eliquis x 4 days without overt bleeding or other new complaints. Denies worsening reflux, abd pain, dysphagia, n/v, bowel change or blood.  Pt denies chest pain, increased sob or doe, wheezing, orthopnea, PND, increased LE swelling, palpitations, dizziness or syncope.  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  Pt denies new neurological symptoms such as new headache, or facial or extremity weakness or numbness   Pt denies polydipsia, polyuria Past Medical History:  Diagnosis Date  . ABNORMAL ELECTROCARDIOGRAM 11/02/2008  . ABSCESS, LUNG 10/23/2006  . Anemia 05/26/2014  . BACTERIAL PNEUMONIA 12/28/2009  . BPH (benign prostatic hyperplasia) 11/22/2010  . 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 CHEST PAIN 12/24/2009  . Coronary artery calcification seen on CT scan 06/01/2015  . Cramp of limb 07/19/2007  . DIVERTICULOSIS, COLON 03/07/2007  . DVT (deep venous thrombosis) ([place]) 01/2014   LLE  . Emphysema of lung ([place])   . 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/23/2006  . FLANK PAIN, LEFT 02/25/2010  . FREQUENCY, URINARY 12/24/2009  . HEMORRHOIDS 10/23/2006  . HYPERLIPIDEMIA 10/23/2006  . HYPERTENSION 10/23/2006  . PE (pulmonary embolism) 02/2014  . PLANTAR FASCIITIS, LEFT 11/02/2008  . Pneumonia 12/2013 X 2  . PSA, INCREASED 11/20/2007  . PULMONARY NODULE 05/14/2008  . Unspecified Peripheral Vascular Disease 10/23/2006   Past Surgical History:  Procedure Laterality Date  . BIOPSY  09/29/2019   Procedure: BIOPSY;  Surgeon: Jerene Bears, MD;  [facility];  Service: Endoscopy;;  . BIOPSY  12/25/2019   Procedure: BIOPSY;  Surgeon: Milus Banister, MD;  [facility];  Service: Endoscopy;;  . ESOPHAGOGASTRODUODENOSCOPY (EGD) WITH PROPOFOL N/A 06/15/2017   Procedure:  ESOPHAGOGASTRODUODENOSCOPY (EGD) WITH PROPOFOL;  Surgeon: Jerene Bears, MD;  [facility];  Service: Gastroenterology;  Laterality: N/A;  . ESOPHAGOGASTRODUODENOSCOPY (EGD) WITH PROPOFOL N/A 09/29/2019   Procedure: ESOPHAGOGASTRODUODENOSCOPY (EGD) WITH PROPOFOL;  Surgeon: Jerene Bears, MD;  [facility];  Service: Endoscopy;  Laterality: N/A;  . ESOPHAGOGASTRODUODENOSCOPY (EGD) WITH PROPOFOL N/A 12/25/2019   Procedure: ESOPHAGOGASTRODUODENOSCOPY (EGD) WITH PROPOFOL;  Surgeon: Milus Banister, MD;  [facility];  Service: Endoscopy;  Laterality: N/A;  . EUS N/A 12/25/2019   Procedure: UPPER ENDOSCOPIC ULTRASOUND (EUS) RADIAL;  Surgeon: Milus Banister, MD;  [facility];  Service: Endoscopy;  Laterality: N/A;  . HEMORRHOID SURGERY  ?1990's  . INGUINAL HERNIA REPAIR Bilateral ?2000's  . IVC FILTER INSERTION N/A 10/03/2016   Procedure: IVC Filter Retrieveal;  Surgeon: Serafina Mitchell, MD;  Location: [facility];  Service: Cardiovascular;  Laterality: N/A;  . MYRINGOTOMY WITH TUBE PLACEMENT Right 06/26/2018  . SHOULDER ARTHROSCOPY W/ ROTATOR CUFF REPAIR Right 11/2013  . STAPLING OF BLEBS Right 07/31/2016   Procedure: BLEBECTOMY;  Surgeon: Melrose Nakayama, MD;  Location: [facility];  Service: Thoracic;  Laterality: Right;  . VENA CAVA FILTER PLACEMENT Right 08/04/2016   Procedure: INSERTION VENA-CAVA FILTER;  Surgeon: Melrose Nakayama, MD;  Location: [facility];  Service: Thoracic;  Laterality: Right;  Marland Kitchen VIDEO ASSISTED THORACOSCOPY Right 07/31/2016   Procedure: VIDEO ASSISTED THORACOSCOPY;  Surgeon: Melrose Nakayama, MD;  Location: [facility];  Service: Thoracic;  Laterality: Right;  Marland Kitchen VIDEO  ASSISTED THORACOSCOPY Right 08/04/2016   Procedure: REDO VIDEO ASSISTED THORACOSCOPY- EVACUATION OF HEMOTHORAX;  Surgeon: Melrose Nakayama, MD;  Location: [facility];  Service: Thoracic;  Laterality: Right;  Marland Kitchen VIDEO BRONCHOSCOPY WITH ENDOBRONCHIAL NAVIGATION N/A  03/11/2014   Procedure: VIDEO BRONCHOSCOPY WITH ENDOBRONCHIAL NAVIGATION;  Surgeon: Collene Gobble, MD;  Location: [facility];  Service: Thoracic;  Laterality: N/A;    reports that he has quit smoking. His smoking use included cigarettes. He has a 15.00 pack-year smoking history. He has never used smokeless tobacco. He reports current alcohol use. He reports current drug use. Drug: Marijuana. family history includes Heart disease in his father and mother; Kidney disease in his sister. Allergies  Allergen Reactions  . Pylera [Bis Subcit-Metronid-Tetracyc]     Itching    Current Outpatient Medications on File Prior to Visit  Medication Sig Dispense Refill  . albuterol (VENTOLIN HFA) 108 (90 Base) MCG/ACT inhaler Inhale 2 puffs into the lungs every 6 (six) hours as needed for wheezing or shortness of breath. 8 g 5  . amLODipine (NORVASC) 10 MG tablet Take 1 tablet (10 mg total) by mouth daily. 90 tablet 3  . ezetimibe (ZETIA) 10 MG tablet Take 1 tablet (10 mg total) by mouth daily. Annual appt due in Oct must see provider for future refills (Patient taking differently: Take 10 mg by mouth daily. ) 90 tablet 3  . hydrOXYzine (ATARAX/VISTARIL) 10 MG tablet Take 1 tablet (10 mg total) by mouth 3 (three) times daily as needed for itching. 30 tablet 0  . Multiple Vitamins-Minerals (MULTIVITAMIN WITH MINERALS) tablet Take 1 tablet by mouth daily. Men 29 +    . rosuvastatin (CRESTOR) 40 MG tablet Take 1 tablet by mouth once daily (Patient taking differently: Take 40 mg by mouth daily. ) 90 tablet 0  . tadalafil (CIALIS) 20 MG tablet TAKE 1 TABLET BY MOUTH ONCE DAILY AS NEEDED FOR ERECTILE DYSFUNCTION (Patient taking differently: Take 20 mg by mouth daily as needed for erectile dysfunction. ) 6 tablet 11  . temazepam (RESTORIL) 15 MG capsule TAKE 1 TO 2 CAPSULES BY MOUTH AT BEDTIME AS NEEDED FOR SLEEP (Patient taking differently: Take 15-30 mg by mouth at bedtime as needed for sleep. ) 60 capsule 5  . tiZANidine  (ZANAFLEX) 2 MG tablet TAKE 1 TABLET BY MOUTH TWICE DAILY AS NEEDED FOR MUSCLE SPASM (Patient taking differently: Take 2 mg by mouth 2 (two) times daily as needed for muscle spasms. ) 60 tablet 0   No current facility-administered medications on file prior to visit.   Review of Systems All otherwise neg per pt    Objective:   Physical Exam BP 120/60 (BP Location: Left Arm, Patient Position: Sitting, Cuff Size: Large)   Pulse 89   Temp 99.1 F (37.3 C) (Oral)   Ht 6\' 4"  (1.93 m)   Wt 174 lb (78.9 kg)   SpO2 98%   BMI 21.18 kg/m  VS noted,  Constitutional: Pt appears in NAD HENT: Head: NCAT.  Right Ear: External ear normal.  Left Ear: External ear normal.  Eyes: . Pupils are equal, round, and reactive to light. Conjunctivae and EOM are normal Nose: without d/c or deformity Neck: Neck supple. Gross normal ROM Cardiovascular: Normal rate and regular rhythm.   Pulmonary/Chest: Effort normal and breath sounds without rales or wheezing.  Abd:  Soft, NT, ND, + BS, no organomegaly Neurological: Pt is alert. At baseline orientation, motor grossly intact Skin: Skin is warm. No rashes, other new lesions,  no LE edema Psychiatric: Pt behavior is normal without agitation  All otherwise neg per pt  Lab Results  Component Value Date   WBC 4.0 10/22/2019   HGB 9.5 (L) 10/22/2019   HCT 29.8 (L) 10/22/2019   PLT 242 10/22/2019   GLUCOSE 93 10/22/2019   CHOL 204 (H) 08/26/2019   TRIG 141.0 08/26/2019   HDL 52.40 08/26/2019   LDLDIRECT 81.0 07/16/2018   LDLCALC 123 (H) 08/26/2019   ALT 7 (L) 10/22/2019   AST 13 10/22/2019   NA 141 10/22/2019   K 4.0 10/22/2019   CL 113 (H) 10/22/2019   CREATININE 2.34 (H) 10/22/2019   BUN 24 10/22/2019   CO2 22 10/22/2019   TSH 2.77 08/26/2019   PSA 1.97 08/26/2019   INR 1.2 09/29/2019   HGBA1C 4.2 (L) 12/11/2018   MICROALBUR 1.5 08/25/2015      Assessment & Plan:

## 2019-12-24 NOTE — Patient Instructions (Signed)
Ok to stay off the eliquis until after the procedure tomorrow, and restart if OK with Dr Ardis Hughs  Please continue all other medications as before, and refills have been done if requested.  Please have the pharmacy call with any other refills you may need.  Please continue your efforts at being more active, low cholesterol diet, and weight control  Please keep your appointments with your specialists as you may have planned  You will be contacted regarding the referral for: nephrology (kidney doctor)  Please go to the LAB at the blood drawing area for the tests to be done  You will be contacted by phone if any changes need to be made immediately.  Otherwise, you will receive a letter about your results with an explanation, but please check with MyChart first.  Please remember to sign up for MyChart if you have not done so, as this will be important to you in the future with finding out test results, communicating by private email, and scheduling acute appointments online when needed.  Please make an Appointment to return in 6 months, or sooner if needed

## 2019-12-25 ENCOUNTER — Encounter (HOSPITAL_COMMUNITY): Payer: Self-pay | Admitting: Gastroenterology

## 2019-12-25 ENCOUNTER — Ambulatory Visit (HOSPITAL_COMMUNITY)
Admission: RE | Admit: 2019-12-25 | Discharge: 2019-12-25 | Disposition: A | Discharge status: Home / Self Care | Payer: Medicare Other | Attending: Gastroenterology | Admitting: Gastroenterology

## 2019-12-25 ENCOUNTER — Encounter (HOSPITAL_COMMUNITY)
Admission: RE | Disposition: A | Discharge status: Home / Self Care | Payer: Self-pay | Source: Home / Self Care | Attending: Gastroenterology

## 2019-12-25 ENCOUNTER — Telehealth: Payer: Self-pay | Admitting: Internal Medicine

## 2019-12-25 ENCOUNTER — Ambulatory Visit (HOSPITAL_COMMUNITY): Payer: Medicare Other | Admitting: Anesthesiology

## 2019-12-25 DIAGNOSIS — Q438 Other specified congenital malformations of intestine: Secondary | ICD-10-CM | POA: Insufficient documentation

## 2019-12-25 DIAGNOSIS — K929 Disease of digestive system, unspecified: Secondary | ICD-10-CM | POA: Diagnosis present

## 2019-12-25 DIAGNOSIS — K3189 Other diseases of stomach and duodenum: Secondary | ICD-10-CM | POA: Diagnosis not present

## 2019-12-25 HISTORY — PX: ESOPHAGOGASTRODUODENOSCOPY (EGD) WITH PROPOFOL: SHX5813

## 2019-12-25 HISTORY — PX: EUS: SHX5427

## 2019-12-25 HISTORY — PX: BIOPSY: SHX5522

## 2019-12-25 LAB — PTH, INTACT AND CALCIUM
Calcium: 8.8 mg/dL (ref 8.6–10.3)
PTH: 37 pg/mL (ref 14–64)

## 2019-12-25 SURGERY — UPPER ENDOSCOPIC ULTRASOUND (EUS) RADIAL
Anesthesia: Monitor Anesthesia Care

## 2019-12-25 MED ORDER — LIDOCAINE 2% (20 MG/ML) 5 ML SYRINGE
INTRAMUSCULAR | Status: DC | PRN
  Administered 2019-12-25: 100 mg via INTRAVENOUS

## 2019-12-25 MED ORDER — PHENYLEPHRINE HCL (PRESSORS) 10 MG/ML IV SOLN
INTRAVENOUS | Status: DC | PRN
  Administered 2019-12-25: 120 ug via INTRAVENOUS
  Administered 2019-12-25: 80 ug via INTRAVENOUS

## 2019-12-25 MED ORDER — PROPOFOL 500 MG/50ML IV EMUL
INTRAVENOUS | Status: DC | PRN
  Administered 2019-12-25: 150 ug/kg/min via INTRAVENOUS

## 2019-12-25 MED ORDER — PROPOFOL 10 MG/ML IV BOLUS
INTRAVENOUS | Status: DC | PRN
  Administered 2019-12-25: 20 mg via INTRAVENOUS

## 2019-12-25 MED ORDER — PROPOFOL 500 MG/50ML IV EMUL
INTRAVENOUS | Status: AC
  Filled 2019-12-25: qty 50

## 2019-12-25 MED ORDER — SODIUM CHLORIDE 0.9 % IV SOLN: INTRAVENOUS | Status: DC

## 2019-12-25 MED ORDER — LACTATED RINGERS IV SOLN: INTRAVENOUS | Status: DC | PRN

## 2019-12-25 MED ORDER — PROPOFOL 1000 MG/100ML IV EMUL
INTRAVENOUS | Status: AC
  Filled 2019-12-25: qty 100

## 2019-12-25 NOTE — Transfer of Care (Signed)
Immediate Anesthesia Transfer of Care Note  Patient: Charles [name]  Procedure(s) Performed: UPPER ENDOSCOPIC ULTRASOUND (EUS) RADIAL (N/A ) BIOPSY  Patient Location: PACU  Anesthesia Type:MAC  Level of Consciousness: sedated, patient cooperative and responds to stimulation  Airway & Oxygen Therapy: Patient Spontanous Breathing and Patient connected to face mask oxygen  Post-op Assessment: Report given to RN and Post -op Vital signs reviewed and stable  Post vital signs: Reviewed and stable  Last Vitals:  Vitals Value Taken Time  BP 97/60 12/25/19 0855  Temp    Pulse 58 12/25/19 0856  Resp 13 12/25/19 0856  SpO2 100 % 12/25/19 0856  Vitals shown include unvalidated device data.  Last Pain:  Vitals:   12/25/19 0745  TempSrc: Oral  PainSc: 0-No pain         Complications: No complications documented.

## 2019-12-25 NOTE — Anesthesia Preprocedure Evaluation (Signed)
Anesthesia Evaluation  Patient identified by MRN, date of birth, ID band Patient awake    Reviewed: Allergy & Precautions, NPO status , Patient's Chart, lab work & pertinent test results  Airway Mallampati: I  TM Distance: >3 FB Neck ROM: Full    Dental   Pulmonary COPD, former smoker,    Pulmonary exam normal        Cardiovascular hypertension, Pt. on medications Normal cardiovascular exam     Neuro/Psych CVA    GI/Hepatic GERD  Medicated and Controlled,  Endo/Other    Renal/GU Renal InsufficiencyRenal disease     Musculoskeletal   Abdominal   Peds  Hematology   Anesthesia Other Findings   Reproductive/Obstetrics                             Anesthesia Physical Anesthesia Plan  ASA: III  Anesthesia Plan: MAC   Post-op Pain Management:    Induction: Intravenous  PONV Risk Score and Plan: 1 and Treatment may vary due to age or medical condition  Airway Management Planned: Nasal Cannula  Additional Equipment:   Intra-op Plan:   Post-operative Plan:   Informed Consent: I have reviewed the patients History and Physical, chart, labs and discussed the procedure including the risks, benefits and alternatives for the proposed anesthesia with the patient or authorized representative who has indicated his/her understanding and acceptance.       Plan Discussed with: CRNA and Surgeon  Anesthesia Plan Comments:         Anesthesia Quick Evaluation

## 2019-12-25 NOTE — Discharge Instructions (Signed)
YOU HAD AN ENDOSCOPIC PROCEDURE TODAY: Refer to the procedure report and other information in the discharge instructions given to you for any specific questions about what was found during the examination. If this information does not answer your questions, please call [place] office at [phone] to clarify.   YOU SHOULD EXPECT: Some feelings of bloating in the abdomen. Passage of more gas than usual. Walking can help get rid of the air that was put into your GI tract during the procedure and reduce the bloating. If you had a lower endoscopy (such as a colonoscopy or flexible sigmoidoscopy) you may notice spotting of blood in your stool or on the toilet paper. Some abdominal soreness may be present for a day or two, also.  DIET: Your first meal following the procedure should be a light meal and then it is ok to progress to your normal diet. A half-sandwich or bowl of soup is an example of a good first meal. Heavy or fried foods are harder to digest and may make you feel nauseous or bloated. Drink plenty of fluids but you should avoid alcoholic beverages for 24 hours. If you had a esophageal dilation, please see attached instructions for diet.    ACTIVITY: Your care partner should take you home directly after the procedure. You should plan to take it easy, moving slowly for the rest of the day. You can resume normal activity the day after the procedure however YOU SHOULD NOT DRIVE, use power tools, machinery or perform tasks that involve climbing or major physical exertion for 24 hours (because of the sedation medicines used during the test).   SYMPTOMS TO REPORT IMMEDIATELY: A gastroenterologist can be reached at any hour. Please call [phone]  for any of the following symptoms:   Following upper endoscopy (EGD, EUS, ERCP, esophageal dilation) Vomiting of blood or coffee ground material  New, significant abdominal pain  New, significant chest pain or pain under the shoulder blades  Painful or  persistently difficult swallowing  New shortness of breath  Black, tarry-looking or red, bloody stools  FOLLOW UP:  If any biopsies were taken you will be contacted by phone or by letter within the next 1-3 weeks. Call [phone]  if you have not heard about the biopsies in 3 weeks.  Please also call with any specific questions about appointments or follow up tests.  

## 2019-12-25 NOTE — H&P (Signed)
HPI: This is a 53 man with duodenal nodule on recent EGD   ROS: complete GI ROS as described in HPI, all other review negative.  Constitutional:  No unintentional weight loss   Past Medical History:  Diagnosis Date  . ABNORMAL ELECTROCARDIOGRAM 11/02/2008  . ABSCESS, LUNG 10/23/2006  . Anemia 05/26/2014  . BACTERIAL PNEUMONIA 12/28/2009  . BPH (benign prostatic hyperplasia) 11/22/2010  . 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 CHEST PAIN 12/24/2009  . Coronary artery calcification seen on CT scan 06/01/2015  . Cramp of limb 07/19/2007  . DIVERTICULOSIS, COLON 03/07/2007  . DVT (deep venous thrombosis) ([place]) 01/2014   LLE  . Emphysema of lung ([place])   . 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/23/2006  . FLANK PAIN, LEFT 02/25/2010  . FREQUENCY, URINARY 12/24/2009  . HEMORRHOIDS 10/23/2006  . HYPERLIPIDEMIA 10/23/2006  . HYPERTENSION 10/23/2006  . PE (pulmonary embolism) 02/2014  . PLANTAR FASCIITIS, LEFT 11/02/2008  . Pneumonia 12/2013 X 2  . PSA, INCREASED 11/20/2007  . PULMONARY NODULE 05/14/2008  . Unspecified Peripheral Vascular Disease 10/23/2006    Past Surgical History:  Procedure Laterality Date  . BIOPSY  09/29/2019   Procedure: BIOPSY;  Surgeon: Jerene Bears, MD;  [facility];  Service: Endoscopy;;  . ESOPHAGOGASTRODUODENOSCOPY (EGD) WITH PROPOFOL N/A 06/15/2017   Procedure: ESOPHAGOGASTRODUODENOSCOPY (EGD) WITH PROPOFOL;  Surgeon: Jerene Bears, MD;  [facility];  Service: Gastroenterology;  Laterality: N/A;  . ESOPHAGOGASTRODUODENOSCOPY (EGD) WITH PROPOFOL N/A 09/29/2019   Procedure: ESOPHAGOGASTRODUODENOSCOPY (EGD) WITH PROPOFOL;  Surgeon: Jerene Bears, MD;  [facility];  Service: Endoscopy;  Laterality: N/A;  . HEMORRHOID SURGERY  ?1990's  . INGUINAL HERNIA REPAIR Bilateral ?2000's  . IVC FILTER INSERTION N/A 10/03/2016   Procedure: IVC Filter Retrieveal;  Surgeon: Serafina Mitchell, MD;  Location: [facility];  Service: Cardiovascular;  Laterality: N/A;  .  MYRINGOTOMY WITH TUBE PLACEMENT Right 06/26/2018  . SHOULDER ARTHROSCOPY W/ ROTATOR CUFF REPAIR Right 11/2013  . STAPLING OF BLEBS Right 07/31/2016   Procedure: BLEBECTOMY;  Surgeon: Melrose Nakayama, MD;  Location: [facility];  Service: Thoracic;  Laterality: Right;  . VENA CAVA FILTER PLACEMENT Right 08/04/2016   Procedure: INSERTION VENA-CAVA FILTER;  Surgeon: Melrose Nakayama, MD;  Location: [facility];  Service: Thoracic;  Laterality: Right;  Marland Kitchen VIDEO ASSISTED THORACOSCOPY Right 07/31/2016   Procedure: VIDEO ASSISTED THORACOSCOPY;  Surgeon: Melrose Nakayama, MD;  Location: [facility];  Service: Thoracic;  Laterality: Right;  Marland Kitchen VIDEO ASSISTED THORACOSCOPY Right 08/04/2016   Procedure: REDO VIDEO ASSISTED THORACOSCOPY- EVACUATION OF HEMOTHORAX;  Surgeon: Melrose Nakayama, MD;  Location: [facility];  Service: Thoracic;  Laterality: Right;  Marland Kitchen VIDEO BRONCHOSCOPY WITH ENDOBRONCHIAL NAVIGATION N/A 03/11/2014   Procedure: VIDEO BRONCHOSCOPY WITH ENDOBRONCHIAL NAVIGATION;  Surgeon: Collene Gobble, MD;  Location: [facility];  Service: Thoracic;  Laterality: N/A;    No current facility-administered medications for this encounter.    Allergies as of 11/11/2019 - Review Complete 11/10/2019  Allergen Reaction Noted  . Pylera [bis subcit-metronid-tetracyc]  10/22/2019    Family History  Problem Relation Age of Onset  . Heart disease Mother   . Heart disease Father   . Kidney disease Sister        Renal transplant  . Colon cancer Neg Hx   . Rectal cancer Neg Hx     Social History   Socioeconomic History  . Marital status: Married    Spouse name: Not on file  . Number of children: 2  .  Years of education: Not on file  . Highest education level: Not on file  Occupational History  . Occupation: retired  Tobacco Use  . Smoking status: Former Smoker    Packs/day: 1.00    Years: 15.00    Pack years: 15.00    Types: Cigarettes  . Smokeless tobacco: Never Used  . Tobacco comment: "quit smoking  cigarettes in the 1990's"  Vaping Use  . Vaping Use: Never used  Substance and Sexual Activity  . Alcohol use: Yes    Alcohol/week: 0.0 standard drinks    Comment: patient states he stopped 4-5 days ago  . 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 Sexual activity: Yes  Other Topics Concern  . Not on file  Social History Narrative  . Not on file   Social Determinants of Health   Financial Resource Strain:   . Difficulty of Paying Living Expenses: Not on file  Food Insecurity:   . Worried About Charity fundraiser in the Last Year: Not on file  . Ran Out of Food in the Last Year: Not on file  Transportation Needs:   . Lack of Transportation (Medical): Not on file  . Lack of Transportation (Non-Medical): Not on file  Physical Activity:   . Days of Exercise per Week: Not on file  . Minutes of Exercise per Session: Not on file  Stress:   . Feeling of Stress : Not on file  Social Connections:   . Frequency of Communication with Friends and Family: Not on file  . Frequency of Social Gatherings with Friends and Family: Not on file  . Attends Religious Services: Not on file  . Active Member of Clubs or Organizations: Not on file  . Attends Archivist Meetings: Not on file  . Marital Status: Not on file  Intimate Partner Violence:   . Fear of Current or Ex-Partner: Not on file  . Emotionally Abused: Not on file  . Physically Abused: Not on file  . Sexually Abused: Not on file     Physical Exam: There were no vitals taken for this visit. Constitutional: generally well-appearing Psychiatric: alert and oriented x3 Abdomen: soft, nontender, nondistended, no obvious ascites, no peritoneal signs, normal bowel sounds No peripheral edema noted in lower extremities  Assessment and plan: 69 y.o. male with duodenal nodule  For upper EUS today  Please see the "Patient Instructions" section for addition details about the plan.  Owens Loffler,  MD [facility] 12/25/2019, 7:25 AM

## 2019-12-25 NOTE — Op Note (Addendum)
[facility] Patient Name: Charles [name] Procedure Date: 12/25/2019 MRN: [id number] Attending MD: Milus Banister , MD Date of Birth: Mar 10, 1950 CSN: [id number] Age: 69 Admit Type: Outpatient Procedure:                Upper EUS Indications:              Duodenal nodule found on recent EGD, tunnel                            biopsies suggested 'gastric heterotopia' Providers:                Milus Banister, MD, Angus Seller, Elspeth Cho Tech., Technician, Lesia Sago,                            Technician, Herbie Drape, CRNA Referring MD:             Zenovia Jarred, MD Medicines:                Monitored Anesthesia Care Complications:            No immediate complications. Estimated blood loss:                            None. Estimated Blood Loss:     Estimated blood loss: none. Procedure:                Pre-Anesthesia Assessment:                           - Prior to the procedure, a History and Physical                            was performed, and patient medications and                            allergies were reviewed. The patient's tolerance of                            previous anesthesia was also reviewed. The risks                            and benefits of the procedure and the sedation                            options and risks were discussed with the patient.                            All questions were answered, and informed consent                            was obtained. Prior Anticoagulants: The patient has                            taken  Eliquis (apixaban), last dose was 3 days                            prior to procedure. ASA Grade Assessment: III - A                            patient with severe systemic disease. After                            reviewing the risks and benefits, the patient was                            deemed in satisfactory condition to undergo the                            procedure.                            After obtaining informed consent, the endoscope was                            passed under direct vision. Throughout the                            procedure, the patient's blood pressure, pulse, and                            oxygen saturations were monitored continuously. The                            [id number]-UE160-[id number] ([id number]) Olympus Radial EUS was                            introduced through the mouth, and advanced to the                            third part of duodenum. The [id number]-[id number] ([id number]) was                            introduced through the mouth, and advanced to the                            third part of duodenum. The upper EUS was                            accomplished without difficulty. The patient                            tolerated the procedure well. Scope In: Scope Out: Findings:      ENDOSCOPIC FINDING (using radial EUS scope and standard adult       gastroscope) :      1. Soft nodule in the duodenal bulb, immediately post pylorus. This       measured about 1.5cm across. This was not a  discrete nodule, rather it       seemed confluent with slightly irregular nearby duodenal mucosa that was       not neoplastic appearing, just a bit congested. Following EUS evaluation       below I extensively biopsied the nodule with forceps.      2. Mild, non-specific gastropathy.      3. The UGI tract was otherwise normal.      ENDOSONOGRAPHIC FINDING: :      1. The lesion in the duodenal bulb described above correlated with       slightly, non-specifically thickened mucosa and deep mucosa layer of the       duodenal wall. There were no discrete tumors, masses of the duodenal       wall.      2. Limited views of the pancreas, liver, spleen, gallbladder were all       normal. Impression:               - Soft, mucosal nodule of the duodenal bulb that is                            associated with non-specific thickening of the                             mucosa, deep mucosa layers by [place]. This does not                            appear neoplastic, I repeated mucosal biopsies with                            forceps following EUS evaluation. 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 Await final pathology. Moderate Sedation:      Not Applicable - Patient had care per Anesthesia. Recommendation:           - Discharge patient to home.                           - OK to resume your blood thinner today. Procedure Code(s):        --- Professional ---                           [phone], Esophagogastroduodenoscopy, flexible,                            transoral; with endoscopic ultrasound examination                            limited to the esophagus, stomach or  duodenum, and                            adjacent structures                           [id number], Esophagogastroduodenoscopy, flexible,                            transoral; with biopsy, single or multiple Diagnosis Code(s):        --- Professional ---                           K31.89, Other diseases of stomach and duodenum CPT copyright 2019 American Medical Association. All rights reserved. The codes documented in this report are preliminary and upon coder review may  be revised to meet current compliance requirements. Milus Banister, MD 12/25/2019 9:00:39 AM This report has been signed electronically. Number of Addenda: 0

## 2019-12-25 NOTE — Anesthesia Postprocedure Evaluation (Signed)
Anesthesia Post Note  Patient: Charles [name]  Procedure(s) Performed: UPPER ENDOSCOPIC ULTRASOUND (EUS) RADIAL (N/A ) BIOPSY     Patient location during evaluation: PACU Anesthesia Type: MAC Level of consciousness: awake and alert Pain management: pain level controlled Vital Signs Assessment: post-procedure vital signs reviewed and stable Respiratory status: spontaneous breathing, nonlabored ventilation, respiratory function stable and patient connected to nasal cannula oxygen Cardiovascular status: stable and blood pressure returned to baseline Postop Assessment: no apparent nausea or vomiting Anesthetic complications: no   No complications documented.  Last Vitals:  Vitals:   12/25/19 0910 12/25/19 0920  BP: 106/61 122/76  Pulse: (!) 58 63  Resp: (!) 23 (!) 23  Temp:    SpO2: 100% 100%    Last Pain:  Vitals:   12/25/19 0920  TempSrc:   PainSc: 0-No pain                 Nicolina Hirt DAVID

## 2019-12-25 NOTE — Telephone Encounter (Signed)
    Patient requesting referral to Urologist  Patient requesting refill on Eliquis, pharmacy [place], [place]

## 2019-12-26 ENCOUNTER — Other Ambulatory Visit: Payer: Self-pay

## 2019-12-26 ENCOUNTER — Encounter (HOSPITAL_COMMUNITY): Payer: Self-pay | Admitting: Gastroenterology

## 2019-12-26 LAB — SURGICAL PATHOLOGY

## 2019-12-26 MED ORDER — APIXABAN 2.5 MG PO TABS: 2.5000 mg | ORAL_TABLET | Freq: Two times a day (BID) | ORAL | 3 refills | Status: DC

## 2019-12-26 MED ORDER — APIXABAN 2.5 MG PO TABS: 2.5000 mg | ORAL_TABLET | Freq: Two times a day (BID) | ORAL | 0 refills | Status: DC

## 2019-12-26 NOTE — Telephone Encounter (Signed)
Sent to Dr. John. 

## 2019-12-26 NOTE — Telephone Encounter (Signed)
Done erx 

## 2019-12-26 NOTE — Telephone Encounter (Signed)
   Patient calling, pharmacy does not have order for Eliquis Scheduler called pharmacy to confirm .  Please resend order. Patient has no medication remaining

## 2019-12-26 NOTE — Addendum Note (Signed)
Addended by: Biagio Borg on: 12/26/2019 09:56 PM   Modules accepted: Orders

## 2019-12-27 ENCOUNTER — Encounter: Payer: Self-pay | Admitting: Internal Medicine

## 2019-12-27 DIAGNOSIS — Z7901 Long term (current) use of anticoagulants: Secondary | ICD-10-CM | POA: Insufficient documentation

## 2019-12-27 NOTE — Assessment & Plan Note (Signed)
stable overall by history and exam, recent data reviewed with pt, and pt to continue medical treatment as before,  to f/u any worsening symptoms or concerns  

## 2019-12-27 NOTE — Assessment & Plan Note (Addendum)
With hx of dvt and PE x 2 in the past 5 yrs; for long term eliquis asd, to cont hold for now, but resume day after egd if ok with GI

## 2019-12-27 NOTE — Assessment & Plan Note (Addendum)
stable overall by history and exam, recent data reviewed with pt, and pt to continue medical treatment as before,  to f/u any worsening symptoms or concerns, for f/u egd in am  I spent 31 minutes in preparing to see the patient by review of recent labs, imaging and procedures, obtaining and reviewing separately obtained history, communicating with the patient and family or caregiver, ordering medications, tests or procedures, and documenting clinical information in the EHR including the differential Dx, treatment, and any further evaluation and other management of anemia, chronic anticoagulation, copd, ckd, hyperglycemia

## 2019-12-30 ENCOUNTER — Other Ambulatory Visit: Payer: Self-pay | Admitting: Internal Medicine

## 2019-12-30 NOTE — Telephone Encounter (Signed)
Please refill as per office routine med refill policy (all routine meds refilled for 3 mo or monthly per pt preference up to one year from last visit, then month to month grace period for 3 mo, then further med refills will have to be denied)  

## 2019-12-31 ENCOUNTER — Ambulatory Visit (INDEPENDENT_AMBULATORY_CARE_PROVIDER_SITE_OTHER): Payer: Medicare Other | Admitting: Otolaryngology

## 2019-12-31 ENCOUNTER — Encounter (INDEPENDENT_AMBULATORY_CARE_PROVIDER_SITE_OTHER): Payer: Self-pay | Admitting: Otolaryngology

## 2019-12-31 ENCOUNTER — Other Ambulatory Visit: Payer: Self-pay

## 2019-12-31 VITALS — Temp 97.5°F

## 2019-12-31 DIAGNOSIS — H7291 Unspecified perforation of tympanic membrane, right ear: Secondary | ICD-10-CM | POA: Diagnosis not present

## 2019-12-31 NOTE — Progress Notes (Signed)
HPI: Charles [name] is a 69 y.o. male who returns today for evaluation of right ear hearing loss.  He has had several sets of tubes placed in the right ear over the past 3 years.  He last had a T-tube placed about a year and a half ago.  He feels like the T-tube might of come out.  He has slight decreased hearing but no drainage from the ear..  Past Medical History:  Diagnosis Date  . ABNORMAL ELECTROCARDIOGRAM 11/02/2008  . ABSCESS, LUNG 10/23/2006  . Anemia 05/26/2014  . BACTERIAL PNEUMONIA 12/28/2009  . BPH (benign prostatic hyperplasia) 11/22/2010  . 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 CHEST PAIN 12/24/2009  . Coronary artery calcification seen on CT scan 06/01/2015  . Cramp of limb 07/19/2007  . DIVERTICULOSIS, COLON 03/07/2007  . DVT (deep venous thrombosis) ([place]) 01/2014   LLE  . Emphysema of lung ([place])   . 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/23/2006  . FLANK PAIN, LEFT 02/25/2010  . FREQUENCY, URINARY 12/24/2009  . HEMORRHOIDS 10/23/2006  . HYPERLIPIDEMIA 10/23/2006  . HYPERTENSION 10/23/2006  . PE (pulmonary embolism) 02/2014  . PLANTAR FASCIITIS, LEFT 11/02/2008  . Pneumonia 12/2013 X 2  . PSA, INCREASED 11/20/2007  . PULMONARY NODULE 05/14/2008  . Unspecified Peripheral Vascular Disease 10/23/2006   Past Surgical History:  Procedure Laterality Date  . BIOPSY  09/29/2019   Procedure: BIOPSY;  Surgeon: Jerene Bears, MD;  Location: [facility];  Service: Endoscopy;;  . BIOPSY  12/25/2019   Procedure: BIOPSY;  Surgeon: Milus Banister, MD;  Location: [facility];  Service: Endoscopy;;  . ESOPHAGOGASTRODUODENOSCOPY (EGD) WITH PROPOFOL N/A 06/15/2017   Procedure: ESOPHAGOGASTRODUODENOSCOPY (EGD) WITH PROPOFOL;  Surgeon: Jerene Bears, MD;  Location: [facility];  Service: Gastroenterology;  Laterality: N/A;  . ESOPHAGOGASTRODUODENOSCOPY (EGD) WITH PROPOFOL N/A 09/29/2019   Procedure: ESOPHAGOGASTRODUODENOSCOPY (EGD) WITH PROPOFOL;  Surgeon: Jerene Bears, MD;  Location: [facility];  Service: Endoscopy;   Laterality: N/A;  . ESOPHAGOGASTRODUODENOSCOPY (EGD) WITH PROPOFOL N/A 12/25/2019   Procedure: ESOPHAGOGASTRODUODENOSCOPY (EGD) WITH PROPOFOL;  Surgeon: Milus Banister, MD;  Location: [facility];  Service: Endoscopy;  Laterality: N/A;  . EUS N/A 12/25/2019   Procedure: UPPER ENDOSCOPIC ULTRASOUND (EUS) RADIAL;  Surgeon: Milus Banister, MD;  Location: [facility];  Service: Endoscopy;  Laterality: N/A;  . HEMORRHOID SURGERY  ?1990's  . INGUINAL HERNIA REPAIR Bilateral ?2000's  . IVC FILTER INSERTION N/A 10/03/2016   Procedure: IVC Filter Retrieveal;  Surgeon: Serafina Mitchell, MD;  Location: [place] [facility];  Service: Cardiovascular;  Laterality: N/A;  . MYRINGOTOMY WITH TUBE PLACEMENT Right 06/26/2018  . SHOULDER ARTHROSCOPY W/ ROTATOR CUFF REPAIR Right 11/2013  . STAPLING OF BLEBS Right 07/31/2016   Procedure: BLEBECTOMY;  Surgeon: Melrose Nakayama, MD;  Location: [facility];  Service: Thoracic;  Laterality: Right;  . VENA CAVA FILTER PLACEMENT Right 08/04/2016   Procedure: INSERTION VENA-CAVA FILTER;  Surgeon: Melrose Nakayama, MD;  Location: [place];  Service: Thoracic;  Laterality: Right;  Marland Kitchen VIDEO ASSISTED THORACOSCOPY Right 07/31/2016   Procedure: VIDEO ASSISTED THORACOSCOPY;  Surgeon: Melrose Nakayama, MD;  Location: [place];  Service: Thoracic;  Laterality: Right;  Marland Kitchen VIDEO ASSISTED THORACOSCOPY Right 08/04/2016   Procedure: REDO VIDEO ASSISTED THORACOSCOPY- EVACUATION OF HEMOTHORAX;  Surgeon: Melrose Nakayama, MD;  Location: [place];  Service: Thoracic;  Laterality: Right;  Marland Kitchen VIDEO BRONCHOSCOPY WITH ENDOBRONCHIAL NAVIGATION N/A 03/11/2014   Procedure: VIDEO BRONCHOSCOPY WITH ENDOBRONCHIAL NAVIGATION;  Surgeon: Collene Gobble, MD;  Location: [facility];  Service: Thoracic;  Laterality: N/A;   Social History   Socioeconomic History  . Marital status: Married    Spouse name: Not on file  . Number of children: 2  . Years of education: Not on file  . Highest education level: Not  on file  Occupational History  . Occupation: retired  Tobacco Use  . Smoking status: Former Smoker    Packs/day: 1.00    Years: 15.00    Pack years: 15.00    Types: Cigarettes  . Smokeless tobacco: Never Used  . Tobacco comment: "quit smoking cigarettes in the 1990's"  Vaping Use  . Vaping Use: Never used  Substance and Sexual Activity  . Alcohol use: Yes    Alcohol/week: 0.0 standard drinks    Comment: patient states he stopped 4-5 days ago  . 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 Sexual activity: Yes  Other Topics Concern  . Not on file  Social History Narrative  . Not on file   Social Determinants of Health   Financial Resource Strain:   . Difficulty of Paying Living Expenses: Not on file  Food Insecurity:   . Worried About Charity fundraiser in the Last Year: Not on file  . Ran Out of Food in the Last Year: Not on file  Transportation Needs:   . Lack of Transportation (Medical): Not on file  . Lack of Transportation (Non-Medical): Not on file  Physical Activity:   . Days of Exercise per Week: Not on file  . Minutes of Exercise per Session: Not on file  Stress:   . Feeling of Stress : Not on file  Social Connections:   . Frequency of Communication with Friends and Family: Not on file  . Frequency of Social Gatherings with Friends and Family: Not on file  . Attends Religious Services: Not on file  . Active Member of Clubs or Organizations: Not on file  . Attends Archivist Meetings: Not on file  . Marital Status: Not on file   Family History  Problem Relation Age of Onset  . Heart disease Mother   . Heart disease Father   . Kidney disease Sister        Renal transplant  . Colon cancer Neg Hx   . Rectal cancer Neg Hx    Allergies  Allergen Reactions  . Pylera [Bis Subcit-Metronid-Tetracyc]     Itching    Prior to Admission medications   Medication Sig Start Date End Date Taking? Authorizing Provider   albuterol (VENTOLIN HFA) 108 (90 Base) MCG/ACT inhaler Inhale 2 puffs into the lungs every 6 (six) hours as needed for wheezing or shortness of breath. 08/26/19  Yes Biagio Borg, MD  amLODipine (NORVASC) 10 MG tablet Take 1 tablet (10 mg total) by mouth daily. 08/26/19  Yes Biagio Borg, MD  apixaban (ELIQUIS) 2.5 MG TABS tablet Take 1 tablet (2.5 mg total) by mouth 2 (two) times daily. 12/26/19 02/24/20 Yes Biagio Borg, MD  ezetimibe (ZETIA) 10 MG tablet Take 1 tablet (10 mg total) by mouth daily. Annual appt due in Oct must see provider for future refills Patient taking differently: Take 10 mg by mouth daily.  08/26/19  Yes Biagio Borg, MD  hydrOXYzine (ATARAX/VISTARIL) 10 MG tablet Take 1 tablet (10 mg total) by mouth 3 (three) times daily as needed for itching. 10/22/19  Yes Marrian Salvage, [place]  Multiple Vitamins-Minerals (MULTIVITAMIN WITH MINERALS) tablet Take 1 tablet by mouth daily. Men 64 +   Yes [provider]  rosuvastatin (CRESTOR) 40 MG tablet Take 1 tablet by mouth once daily Patient taking differently: Take 40 mg by mouth daily.  09/23/19  Yes Biagio Borg, MD  tadalafil (CIALIS) 20 MG tablet TAKE 1 TABLET BY MOUTH ONCE DAILY AS NEEDED FOR ERECTILE DYSFUNCTION Patient taking differently: Take 20 mg by mouth daily as needed for erectile dysfunction.  07/17/19  Yes Biagio Borg, MD  temazepam (RESTORIL) 15 MG capsule TAKE 1 TO 2 CAPSULES BY MOUTH AT BEDTIME AS NEEDED FOR SLEEP Patient taking differently: Take 15-30 mg by mouth at bedtime as needed for sleep.  07/17/19  Yes Biagio Borg, MD  tiZANidine (ZANAFLEX) 2 MG tablet TAKE 1 TABLET BY MOUTH TWICE DAILY AS NEEDED FOR MUSCLE SPASM Patient taking differently: Take 2 mg by mouth 2 (two) times daily as needed for muscle spasms.  11/06/19  Yes Biagio Borg, MD     Positive ROS: Otherwise negative  All other systems have been reviewed and were otherwise negative with the exception of those mentioned in the HPI and as  above.  Physical Exam: Constitutional: Alert, well-appearing, no acute distress Ears: External ears without lesions or tenderness. Ear canals are clear bilaterally.  Left TM is clear and intact.  Right ear reveals an anterior TM perforation which is dry with a dry middle ear space.  On tuning fork testing Weber lateralized to the right side however AC was greater than BC on the right side. Nasal: External nose without lesions. Clear nasal passages Oral: Lips and gums without lesions. Tongue and palate mucosa without lesions. Posterior oropharynx clear. Neck: No palpable adenopathy or masses Respiratory: Breathing comfortably  Skin: No facial/neck lesions or rash noted.  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  But no signs of infection.  Plan: He will follow-up in 6 months for recheck and audiologic testing. Instructed to keep water out of the right ear.   Radene Journey, MD

## 2020-01-05 ENCOUNTER — Telehealth: Payer: Self-pay | Admitting: Internal Medicine

## 2020-01-05 DIAGNOSIS — N401 Enlarged prostate with lower urinary tract symptoms: Secondary | ICD-10-CM

## 2020-01-05 NOTE — Telephone Encounter (Signed)
Renal referral already done  We had not decided to do urology referral - can he say why he needs this?  thanks

## 2020-01-05 NOTE — Telephone Encounter (Signed)
Sent to Dr. John. 

## 2020-01-05 NOTE — Telephone Encounter (Signed)
Patient states he talked to Dr. Jenny Reichmann about getting a referral to a urologist and a kidney doctor and was wondering if we could do that. Patient # [phone]

## 2020-01-06 NOTE — Telephone Encounter (Signed)
   Patient made aware referral to [facility] has already been done. He was given the number to call for appointment. However [facility] advised patient to get new referral  Patient requesting Urology referral for frequent urination issue. He states this was discussed during last office visit as he was leaving.

## 2020-01-06 NOTE — Addendum Note (Signed)
Addended by: Biagio Borg on: 01/06/2020 12:43 PM   Modules accepted: Orders

## 2020-01-06 NOTE — Telephone Encounter (Signed)
Dr. Jenny Reichmann, I took care of the [facility] referral.  However, regarding the urology referral, pt states "Patient requesting Urology referral for frequent urination issue. He states this was discussed during last office visit as he was leaving." Your last office note does not state anything about this, so may need to add it if you do referral.  Thanks

## 2020-02-03 ENCOUNTER — Other Ambulatory Visit: Payer: Self-pay

## 2020-02-03 MED ORDER — APIXABAN 2.5 MG PO TABS: 2.5000 mg | ORAL_TABLET | Freq: Two times a day (BID) | ORAL | 3 refills | Status: DC

## 2020-02-03 MED ORDER — ROSUVASTATIN CALCIUM 40 MG PO TABS
40.0000 mg | ORAL_TABLET | Freq: Every day | ORAL | 0 refills | Status: DC
Start: 2020-02-03 — End: 2020-02-26

## 2020-02-03 MED ORDER — AMLODIPINE BESYLATE 10 MG PO TABS
10.0000 mg | ORAL_TABLET | Freq: Every day | ORAL | 3 refills | Status: DC
Start: 2020-02-03 — End: 2021-03-11

## 2020-02-03 MED ORDER — EZETIMIBE 10 MG PO TABS
10.0000 mg | ORAL_TABLET | Freq: Every day | ORAL | 3 refills | Status: DC
Start: 2020-02-03 — End: 2021-02-09

## 2020-02-16 ENCOUNTER — Other Ambulatory Visit: Payer: Self-pay | Admitting: Internal Medicine

## 2020-02-25 ENCOUNTER — Other Ambulatory Visit: Payer: Self-pay

## 2020-02-26 ENCOUNTER — Ambulatory Visit (INDEPENDENT_AMBULATORY_CARE_PROVIDER_SITE_OTHER): Payer: Medicare Other | Admitting: Internal Medicine

## 2020-02-26 ENCOUNTER — Encounter: Payer: Self-pay | Admitting: Internal Medicine

## 2020-02-26 VITALS — BP 140/82 | HR 85 | Temp 98.8°F | Ht 76.0 in | Wt 176.0 lb

## 2020-02-26 DIAGNOSIS — I10 Essential (primary) hypertension: Secondary | ICD-10-CM | POA: Diagnosis not present

## 2020-02-26 DIAGNOSIS — N1831 Chronic kidney disease, stage 3a: Secondary | ICD-10-CM | POA: Diagnosis not present

## 2020-02-26 DIAGNOSIS — E559 Vitamin D deficiency, unspecified: Secondary | ICD-10-CM

## 2020-02-26 DIAGNOSIS — R739 Hyperglycemia, unspecified: Secondary | ICD-10-CM

## 2020-02-26 DIAGNOSIS — J439 Emphysema, unspecified: Secondary | ICD-10-CM

## 2020-02-26 DIAGNOSIS — E7849 Other hyperlipidemia: Secondary | ICD-10-CM | POA: Diagnosis not present

## 2020-02-26 DIAGNOSIS — F1010 Alcohol abuse, uncomplicated: Secondary | ICD-10-CM

## 2020-02-26 MED ORDER — ROSUVASTATIN CALCIUM 40 MG PO TABS
40.0000 mg | ORAL_TABLET | Freq: Every day | ORAL | 3 refills | Status: DC
Start: 2020-02-26 — End: 2020-05-17

## 2020-02-26 NOTE — Patient Instructions (Addendum)
Please continue all other medications as before, and refills have been done if requested.  Please have the pharmacy call with any other refills you may need.  Please continue your efforts at being more active, low cholesterol diet, and weight control.  You are otherwise up to date with prevention measures today.  Please keep your appointments with your specialists as you may have planned - Dr Marisue Humble, and urology  Please make an Appointment to return in 6 months, or sooner if needed

## 2020-02-26 NOTE — Progress Notes (Signed)
Established Patient Office Visit  Subjective:  Patient ID: Charles [name], male    DOB: 05-03-1950  Age: 70 y.o. MRN: [id number]      Chief Complaint: for follow up HTN, HLD, ETOH dependence and w/d, right low back pain, ckd, nocturia,        HPI:  Charles [name] is a 70 y.o. male here to f/u; overall doing ok, except did quit ETOH completely 3 wks ago with some tremors for 2 days then stopped and no ETOH since.  BP < 140/90 at home.  Pt did have an episode right LBP moderate constant for 2 days and resolved, without bowel or bladder change, fever, wt loss,  worsening LE pain/numbness/weakness, gait change or falls, all last wk.  Has annoying nocturia x 3 and has urology f/u next wk planned.  Denies urinary symptoms such as dysuria, frequency, urgency, flank pain, hematuria or n/v, fever, chills. Tolerating statin and zetia, declines labs today.  Otherwise   Pt denies chest pain, increasing sob or doe, wheezing, orthopnea, PND, increased LE swelling, palpitations, dizziness or syncope.  Pt denies new neurological symptoms such as new headache, or facial or extremity weakness or numbness.  Pt denies polydipsia, polyuria .        Wt Readings from Last 3 Encounters:  02/26/20 176 lb (79.8 kg)  12/25/19 174 lb (78.9 kg)  12/24/19 174 lb (78.9 kg)   BP Readings from Last 3 Encounters:  02/26/20 140/82  12/25/19 122/76  12/24/19 120/60        Past Medical History:  Diagnosis Date  . ABNORMAL ELECTROCARDIOGRAM 11/02/2008  . ABSCESS, LUNG 10/23/2006  . Anemia 05/26/2014  . BACTERIAL PNEUMONIA 12/28/2009  . BPH (benign prostatic hyperplasia) 11/22/2010  . 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 CHEST PAIN 12/24/2009  . Coronary artery calcification seen on CT scan 06/01/2015  . Cramp of limb 07/19/2007  . DIVERTICULOSIS, COLON 03/07/2007  . DVT (deep venous thrombosis) ([place]) 01/2014   LLE  . Emphysema of lung ([place])   . 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/23/2006  . FLANK PAIN, LEFT 02/25/2010  . FREQUENCY, URINARY  12/24/2009  . HEMORRHOIDS 10/23/2006  . HYPERLIPIDEMIA 10/23/2006  . HYPERTENSION 10/23/2006  . PE (pulmonary embolism) 02/2014  . PLANTAR FASCIITIS, LEFT 11/02/2008  . Pneumonia 12/2013 X 2  . PSA, INCREASED 11/20/2007  . PULMONARY NODULE 05/14/2008  . Unspecified Peripheral Vascular Disease 10/23/2006   Past Surgical History:  Procedure Laterality Date  . BIOPSY  09/29/2019   Procedure: BIOPSY;  Surgeon: Jerene Bears, MD;  Location: [facility];  Service: Endoscopy;;  . BIOPSY  12/25/2019   Procedure: BIOPSY;  Surgeon: Milus Banister, MD;  Location: [facility];  Service: Endoscopy;;  . ESOPHAGOGASTRODUODENOSCOPY (EGD) WITH PROPOFOL N/A 06/15/2017   Procedure: ESOPHAGOGASTRODUODENOSCOPY (EGD) WITH PROPOFOL;  Surgeon: Jerene Bears, MD;  Location: [facility];  Service: Gastroenterology;  Laterality: N/A;  . ESOPHAGOGASTRODUODENOSCOPY (EGD) WITH PROPOFOL N/A 09/29/2019   Procedure: ESOPHAGOGASTRODUODENOSCOPY (EGD) WITH PROPOFOL;  Surgeon: Jerene Bears, MD;  Location: [facility];  Service: Endoscopy;  Laterality: N/A;  . ESOPHAGOGASTRODUODENOSCOPY (EGD) WITH PROPOFOL N/A 12/25/2019   Procedure: ESOPHAGOGASTRODUODENOSCOPY (EGD) WITH PROPOFOL;  Surgeon: Milus Banister, MD;  Location: [facility];  Service: Endoscopy;  Laterality: N/A;  . EUS N/A 12/25/2019   Procedure: UPPER ENDOSCOPIC ULTRASOUND (EUS) RADIAL;  Surgeon: Milus Banister, MD;  Location: [facility];  Service: Endoscopy;  Laterality: N/A;  . HEMORRHOID SURGERY  ?1990's  . INGUINAL HERNIA REPAIR Bilateral ?2000's  .  IVC FILTER INSERTION N/A 10/03/2016   Procedure: IVC Filter Retrieveal;  Surgeon: Serafina Mitchell, MD;  Location: [facility];  Service: Cardiovascular;  Laterality: N/A;  . MYRINGOTOMY WITH TUBE PLACEMENT Right 06/26/2018  . SHOULDER ARTHROSCOPY W/ ROTATOR CUFF REPAIR Right 11/2013  . STAPLING OF BLEBS Right 07/31/2016   Procedure: BLEBECTOMY;  Surgeon: Melrose Nakayama, MD;  Location: [facility];  Service:  Thoracic;  Laterality: Right;  . VENA CAVA FILTER PLACEMENT Right 08/04/2016   Procedure: INSERTION VENA-CAVA FILTER;  Surgeon: Melrose Nakayama, MD;  Location: [facility];  Service: Thoracic;  Laterality: Right;  Marland Kitchen VIDEO ASSISTED THORACOSCOPY Right 07/31/2016   Procedure: VIDEO ASSISTED THORACOSCOPY;  Surgeon: Melrose Nakayama, MD;  Location: [facility];  Service: Thoracic;  Laterality: Right;  Marland Kitchen VIDEO ASSISTED THORACOSCOPY Right 08/04/2016   Procedure: REDO VIDEO ASSISTED THORACOSCOPY- EVACUATION OF HEMOTHORAX;  Surgeon: Melrose Nakayama, MD;  Location: [facility];  Service: Thoracic;  Laterality: Right;  Marland Kitchen VIDEO BRONCHOSCOPY WITH ENDOBRONCHIAL NAVIGATION N/A 03/11/2014   Procedure: VIDEO BRONCHOSCOPY WITH ENDOBRONCHIAL NAVIGATION;  Surgeon: Collene Gobble, MD;  Location: [facility];  Service: Thoracic;  Laterality: N/A;    reports that he has quit smoking. His smoking use included cigarettes. He has a 15.00 pack-year smoking history. He has never used smokeless tobacco. He reports current alcohol use. He reports current drug use. Drug: Marijuana. family history includes Heart disease in his father and mother; Kidney disease in his sister. Allergies  Allergen Reactions  . Pylera [Bis Subcit-Metronid-Tetracyc]     Itching    Current Outpatient Medications on File Prior to Visit  Medication Sig Dispense Refill  . albuterol (VENTOLIN HFA) 108 (90 Base) MCG/ACT inhaler Inhale 2 puffs into the lungs every 6 (six) hours as needed for wheezing or shortness of breath. 8 g 5  . amLODipine (NORVASC) 10 MG tablet Take 1 tablet (10 mg total) by mouth daily. 90 tablet 3  . apixaban (ELIQUIS) 2.5 MG TABS tablet Take 1 tablet (2.5 mg total) by mouth 2 (two) times daily. 180 tablet 3  . ezetimibe (ZETIA) 10 MG tablet Take 1 tablet (10 mg total) by mouth daily. Annual appt due in Oct must see provider for future refills 90 tablet 3  . hydrOXYzine (ATARAX/VISTARIL) 10 MG tablet Take 1 tablet (10 mg total) by mouth 3  (three) times daily as needed for itching. 30 tablet 0  . Multiple Vitamins-Minerals (MULTIVITAMIN WITH MINERALS) tablet Take 1 tablet by mouth daily. Men 66 +    . tadalafil (CIALIS) 20 MG tablet TAKE 1 TABLET BY MOUTH ONCE DAILY AS NEEDED FOR ERECTILE DYSFUNCTION (Patient taking differently: Take 20 mg by mouth daily as needed for erectile dysfunction.) 6 tablet 11  . temazepam (RESTORIL) 15 MG capsule TAKE 1 TO 2 CAPSULES BY MOUTH AT BEDTIME AS NEEDED FOR SLEEP 60 capsule 2  . tiZANidine (ZANAFLEX) 2 MG tablet TAKE 1 TABLET BY MOUTH TWICE DAILY AS NEEDED FOR MUSCLE SPASM 60 tablet 2   No current facility-administered medications on file prior to visit.        ROS:  All others reviewed and negative.  Objective        PE:  BP 140/82 (BP Location: Left Arm, Patient Position: Sitting, Cuff Size: Large)   Pulse 85   Temp 98.8 F (37.1 C) (Oral)   Ht 6\' 4"  (1.93 m)   Wt 176 lb (79.8 kg)   SpO2 98%   BMI 21.42 kg/m  Constitutional: Pt appears in NAD               HENT: Head: NCAT.                Right Ear: External ear normal.                 Left Ear: External ear normal.                Eyes: . Pupils are equal, round, and reactive to light. Conjunctivae and EOM are normal               Nose: without d/c or deformity               Neck: Neck supple. Gross normal ROM               Cardiovascular: Normal rate and regular rhythm.                 Pulmonary/Chest: Effort normal and breath sounds without rales or wheezing.                Abd:  Soft, NT, ND, + BS, no organomegaly               Neurological: Pt is alert. At baseline orientation, motor grossly intact               Skin: Skin is warm. No rashes, no other new lesions, LE edema - none               Psychiatric: Pt behavior is normal without agitation   Assessment/Plan:  Charles [name] is a 70 y.o. Black or African American [2] male with  has a past medical history of ABNORMAL ELECTROCARDIOGRAM (11/02/2008),  ABSCESS, LUNG (10/23/2006), Anemia (05/26/2014), BACTERIAL PNEUMONIA (12/28/2009), BPH (benign prostatic hyperplasia) (11/22/2010), Cerebellar stroke ([place]) (05/26/2014), CHEST PAIN (12/24/2009), Coronary artery calcification seen on CT scan (06/01/2015), Cramp of limb (07/19/2007), DIVERTICULOSIS, COLON (03/07/2007), DVT (deep venous thrombosis) ([place]) (01/2014), Emphysema of lung ([place]), ERECTILE DYSFUNCTION (10/23/2006), FLANK PAIN, LEFT (02/25/2010), FREQUENCY, URINARY (12/24/2009), HEMORRHOIDS (10/23/2006), HYPERLIPIDEMIA (10/23/2006), HYPERTENSION (10/23/2006), PE (pulmonary embolism) (02/2014), PLANTAR FASCIITIS, LEFT (11/02/2008), Pneumonia (12/2013 X 2), PSA, INCREASED (11/20/2007), PULMONARY NODULE (05/14/2008), and Unspecified Peripheral Vascular Disease (10/23/2006).   Assessment Plan  See problem oriented assessment and plan Labs reviewed for each problem: Lab Results  Component Value Date   WBC 7.3 12/24/2019   HGB 11.5 (L) 12/24/2019   HCT 35.2 (L) 12/24/2019   PLT 115.0 (L) 12/24/2019   GLUCOSE 102 (H) 12/24/2019   CHOL 204 (H) 08/26/2019   TRIG 141.0 08/26/2019   HDL 52.40 08/26/2019   LDLDIRECT 81.0 07/16/2018   LDLCALC 123 (H) 08/26/2019   ALT 24 12/24/2019   AST 31 12/24/2019   NA 135 12/24/2019   K 4.4 12/24/2019   CL 107 12/24/2019   CREATININE 2.30 (H) 12/24/2019   BUN 35 (H) 12/24/2019   CO2 21 12/24/2019   TSH 2.77 08/26/2019   PSA 1.97 08/26/2019   INR 1.2 09/29/2019   HGBA1C 4.2 (L) 12/11/2018   MICROALBUR 1.5 08/25/2015    Micro: none  Cardiac tracings I have personally interpreted today:  none  Pertinent Radiological findings (summarize): none    There are no preventive care reminders to display for this patient.  There are no preventive care reminders to display for this patient.  Lab Results  Component Value Date   TSH 2.77 08/26/2019   Lab Results  Component  Value Date   WBC 7.3 12/24/2019   HGB 11.5 (L) 12/24/2019   HCT 35.2 (L) 12/24/2019   MCV 80.8 12/24/2019    PLT 115.0 (L) 12/24/2019   Lab Results  Component Value Date   NA 135 12/24/2019   K 4.4 12/24/2019   CO2 21 12/24/2019   GLUCOSE 102 (H) 12/24/2019   BUN 35 (H) 12/24/2019   CREATININE 2.30 (H) 12/24/2019   BILITOT 0.5 12/24/2019   ALKPHOS 45 12/24/2019   AST 31 12/24/2019   ALT 24 12/24/2019   PROT 7.7 12/24/2019   ALBUMIN 3.9 12/24/2019   CALCIUM 8.8 12/24/2019   CALCIUM 8.8 12/24/2019   ANIONGAP 8 09/30/2019   GFR 28.70 (L) 12/24/2019   Lab Results  Component Value Date   CHOL 204 (H) 08/26/2019   Lab Results  Component Value Date   HDL 52.40 08/26/2019   Lab Results  Component Value Date   LDLCALC 123 (H) 08/26/2019   Lab Results  Component Value Date   TRIG 141.0 08/26/2019   Lab Results  Component Value Date   CHOLHDL 4 08/26/2019   Lab Results  Component Value Date   HGBA1C 4.2 (L) 12/11/2018      Assessment & Plan:   Problem List Items Addressed This Visit      High   COPD (chronic obstructive pulmonary disease) (HCC)    Stable, cont current med tx - ventolin hfa prn       Alcohol abuse    With recent quit x 3 wks and minor w/d symptoms now resolved, urged to abstain further which he plans to do but come next fall "I may start again"        Medium   Vitamin D deficiency    Last vitamin D Lab Results  Component Value Date   VD25OH 40.02 12/24/2019   Stable, cont oral replacement      Hyperlipidemia - Primary    Stable on zetia/crestor,  Lab Results  Component Value Date   LDLCALC 123 (H) 08/26/2019   Declines f/u lab today      Relevant Medications   rosuvastatin (CRESTOR) 40 MG tablet   Hyperglycemia    Lab Results  Component Value Date   HGBA1C 4.2 (L) 12/11/2018   Stable, pt to continue current medical treatment  - diet       Essential hypertension    BP Readings from Last 3 Encounters:  02/26/20 140/82  12/25/19 122/76  12/24/19 120/60   Stable, pt to continue medical treatment  amlodpine 10   Current  Outpatient Medications (Cardiovascular):  .  amLODipine (NORVASC) 10 MG tablet, Take 1 tablet (10 mg total) by mouth daily. Marland Kitchen  ezetimibe (ZETIA) 10 MG tablet, Take 1 tablet (10 mg total) by mouth daily. Annual appt due in Oct must see provider for future refills .  tadalafil (CIALIS) 20 MG tablet, TAKE 1 TABLET BY MOUTH ONCE DAILY AS NEEDED FOR ERECTILE DYSFUNCTION (Patient taking differently: Take 20 mg by mouth daily as needed for erectile dysfunction.) .  rosuvastatin (CRESTOR) 40 MG tablet, Take 1 tablet (40 mg total) by mouth daily.  Current Outpatient Medications (Respiratory):  .  albuterol (VENTOLIN HFA) 108 (90 Base) MCG/ACT inhaler, Inhale 2 puffs into the lungs every 6 (six) hours as needed for wheezing or shortness of breath.   Current Outpatient Medications (Hematological):  .  apixaban (ELIQUIS) 2.5 MG TABS tablet, Take 1 tablet (2.5 mg total) by mouth 2 (two) times daily.  Current Outpatient  Medications (Other):  .  hydrOXYzine (ATARAX/VISTARIL) 10 MG tablet, Take 1 tablet (10 mg total) by mouth 3 (three) times daily as needed for itching. .  Multiple Vitamins-Minerals (MULTIVITAMIN WITH MINERALS) tablet, Take 1 tablet by mouth daily. Men 50 + .  temazepam (RESTORIL) 15 MG capsule, TAKE 1 TO 2 CAPSULES BY MOUTH AT BEDTIME AS NEEDED FOR SLEEP .  tiZANidine (ZANAFLEX) 2 MG tablet, TAKE 1 TABLET BY MOUTH TWICE DAILY AS NEEDED FOR MUSCLE SPASM       Relevant Medications   rosuvastatin (CRESTOR) 40 MG tablet   CKD (chronic kidney disease), stage III (HCC)    Lab Results  Component Value Date   CREATININE 2.30 (H) 12/24/2019   Stable overall, cont to avoid nephrotoxins         Meds ordered this encounter  Medications  . rosuvastatin (CRESTOR) 40 MG tablet    Sig: Take 1 tablet (40 mg total) by mouth daily.    Dispense:  90 tablet    Refill:  3    Follow-up: Return in about 6 months (around 08/25/2020).   Cathlean Cower, MD 02/26/2020 8:46 AM [facility]

## 2020-03-08 ENCOUNTER — Encounter: Payer: Self-pay | Admitting: Internal Medicine

## 2020-03-08 NOTE — Assessment & Plan Note (Signed)
Last vitamin D Lab Results  Component Value Date   VD25OH 40.02 12/24/2019   Stable, cont oral replacement

## 2020-03-08 NOTE — Assessment & Plan Note (Signed)
Stable, cont current med tx - ventolin hfa prn  

## 2020-03-08 NOTE — Assessment & Plan Note (Signed)
BP Readings from Last 3 Encounters:  02/26/20 140/82  12/25/19 122/76  12/24/19 120/60   Stable, pt to continue medical treatment  amlodpine 10   Current Outpatient Medications (Cardiovascular):  .  amLODipine (NORVASC) 10 MG tablet, Take 1 tablet (10 mg total) by mouth daily. Marland Kitchen  ezetimibe (ZETIA) 10 MG tablet, Take 1 tablet (10 mg total) by mouth daily. Annual appt due in Oct must see provider for future refills .  tadalafil (CIALIS) 20 MG tablet, TAKE 1 TABLET BY MOUTH ONCE DAILY AS NEEDED FOR ERECTILE DYSFUNCTION (Patient taking differently: Take 20 mg by mouth daily as needed for erectile dysfunction.) .  rosuvastatin (CRESTOR) 40 MG tablet, Take 1 tablet (40 mg total) by mouth daily.  Current Outpatient Medications (Respiratory):  .  albuterol (VENTOLIN HFA) 108 (90 Base) MCG/ACT inhaler, Inhale 2 puffs into the lungs every 6 (six) hours as needed for wheezing or shortness of breath.   Current Outpatient Medications (Hematological):  .  apixaban (ELIQUIS) 2.5 MG TABS tablet, Take 1 tablet (2.5 mg total) by mouth 2 (two) times daily.  Current Outpatient Medications (Other):  .  hydrOXYzine (ATARAX/VISTARIL) 10 MG tablet, Take 1 tablet (10 mg total) by mouth 3 (three) times daily as needed for itching. .  Multiple Vitamins-Minerals (MULTIVITAMIN WITH MINERALS) tablet, Take 1 tablet by mouth daily. Men 50 + .  temazepam (RESTORIL) 15 MG capsule, TAKE 1 TO 2 CAPSULES BY MOUTH AT BEDTIME AS NEEDED FOR SLEEP .  tiZANidine (ZANAFLEX) 2 MG tablet, TAKE 1 TABLET BY MOUTH TWICE DAILY AS NEEDED FOR MUSCLE SPASM

## 2020-03-08 NOTE — Assessment & Plan Note (Signed)
Lab Results  Component Value Date   HGBA1C 4.2 (L) 12/11/2018   Stable, pt to continue current medical treatment  - diet

## 2020-03-08 NOTE — Assessment & Plan Note (Signed)
With recent quit x 3 wks and minor w/d symptoms now resolved, urged to abstain further which he plans to do but come next fall "I may start again"

## 2020-03-08 NOTE — Assessment & Plan Note (Signed)
Lab Results  Component Value Date   CREATININE 2.30 (H) 12/24/2019   Stable overall, cont to avoid nephrotoxins

## 2020-03-08 NOTE — Assessment & Plan Note (Signed)
Stable on zetia/crestor,  Lab Results  Component Value Date   LDLCALC 123 (H) 08/26/2019   Declines f/u lab today

## 2020-05-03 ENCOUNTER — Telehealth: Payer: Self-pay | Admitting: Internal Medicine

## 2020-05-03 MED ORDER — HYDROXYZINE HCL 10 MG PO TABS: 10.0000 mg | ORAL_TABLET | Freq: Three times a day (TID) | ORAL | 0 refills | Status: DC | PRN

## 2020-05-03 NOTE — Telephone Encounter (Signed)
Patient requesting refill for  hydrOXYzine (ATARAX/VISTARIL) 10 MG tablet  Pharmacy [id number] - [place], [place]

## 2020-05-03 NOTE — Telephone Encounter (Signed)
Ok done erx 

## 2020-05-15 ENCOUNTER — Other Ambulatory Visit: Payer: Self-pay | Admitting: Internal Medicine

## 2020-05-15 NOTE — Telephone Encounter (Signed)
Please refill as per office routine med refill policy (all routine meds refilled for 3 mo or monthly per pt preference up to one year from last visit, then month to month grace period for 3 mo, then further med refills will have to be denied)  

## 2020-06-30 ENCOUNTER — Ambulatory Visit (INDEPENDENT_AMBULATORY_CARE_PROVIDER_SITE_OTHER): Payer: Medicare Other | Admitting: Otolaryngology

## 2020-06-30 ENCOUNTER — Other Ambulatory Visit: Payer: Self-pay

## 2020-06-30 ENCOUNTER — Encounter (INDEPENDENT_AMBULATORY_CARE_PROVIDER_SITE_OTHER): Payer: Self-pay | Admitting: Otolaryngology

## 2020-06-30 VITALS — Temp 97.3°F

## 2020-06-30 DIAGNOSIS — H7291 Unspecified perforation of tympanic membrane, right ear: Secondary | ICD-10-CM | POA: Diagnosis not present

## 2020-06-30 DIAGNOSIS — H906 Mixed conductive and sensorineural hearing loss, bilateral: Secondary | ICD-10-CM

## 2020-06-30 NOTE — Progress Notes (Signed)
HPI: Charles [name] is a 70 y.o. male who returns today for evaluation of right ear hearing.  He does not feel like he hears well in the right ear as he does the left and thought he might need a tube replaced in the right ear.  He has had a previous right T-tube placed initially a little over 2 years ago because of chronic eustachian tube dysfunction and previous placement of multiple myringotomy tubes.  The right T-tube was removed several months ago..  Past Medical History:  Diagnosis Date  . ABNORMAL ELECTROCARDIOGRAM 11/02/2008  . ABSCESS, LUNG 10/23/2006  . Anemia 05/26/2014  . BACTERIAL PNEUMONIA 12/28/2009  . BPH (benign prostatic hyperplasia) 11/22/2010  . 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 CHEST PAIN 12/24/2009  . Coronary artery calcification seen on CT scan 06/01/2015  . Cramp of limb 07/19/2007  . DIVERTICULOSIS, COLON 03/07/2007  . DVT (deep venous thrombosis) ([place]) 01/2014   LLE  . Emphysema of lung ([place])   . 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/23/2006  . FLANK PAIN, LEFT 02/25/2010  . FREQUENCY, URINARY 12/24/2009  . HEMORRHOIDS 10/23/2006  . HYPERLIPIDEMIA 10/23/2006  . HYPERTENSION 10/23/2006  . PE (pulmonary embolism) 02/2014  . PLANTAR FASCIITIS, LEFT 11/02/2008  . Pneumonia 12/2013 X 2  . PSA, INCREASED 11/20/2007  . PULMONARY NODULE 05/14/2008  . Unspecified Peripheral Vascular Disease 10/23/2006   Past Surgical History:  Procedure Laterality Date  . BIOPSY  09/29/2019   Procedure: BIOPSY;  Surgeon: Jerene Bears, MD;  [facility];  Service: Endoscopy;;  . BIOPSY  12/25/2019   Procedure: BIOPSY;  Surgeon: Milus Banister, MD;  [facility];  Service: Endoscopy;;  . ESOPHAGOGASTRODUODENOSCOPY (EGD) WITH PROPOFOL N/A 06/15/2017   Procedure: ESOPHAGOGASTRODUODENOSCOPY (EGD) WITH PROPOFOL;  Surgeon: Jerene Bears, MD;  [facility];  Service: Gastroenterology;  Laterality: N/A;  . ESOPHAGOGASTRODUODENOSCOPY (EGD) WITH PROPOFOL N/A 09/29/2019   Procedure:  ESOPHAGOGASTRODUODENOSCOPY (EGD) WITH PROPOFOL;  Surgeon: Jerene Bears, MD;  [facility];  Service: Endoscopy;  Laterality: N/A;  . ESOPHAGOGASTRODUODENOSCOPY (EGD) WITH PROPOFOL N/A 12/25/2019   Procedure: ESOPHAGOGASTRODUODENOSCOPY (EGD) WITH PROPOFOL;  Surgeon: Milus Banister, MD;  [facility];  Service: Endoscopy;  Laterality: N/A;  . EUS N/A 12/25/2019   Procedure: UPPER ENDOSCOPIC ULTRASOUND (EUS) RADIAL;  Surgeon: Milus Banister, MD;  [facility];  Service: Endoscopy;  Laterality: N/A;  . HEMORRHOID SURGERY  ?1990's  . INGUINAL HERNIA REPAIR Bilateral ?2000's  . IVC FILTER INSERTION N/A 10/03/2016   Procedure: IVC Filter Retrieveal;  Surgeon: Serafina Mitchell, MD;  Location: [facility];  Service: Cardiovascular;  Laterality: N/A;  . MYRINGOTOMY WITH TUBE PLACEMENT Right 06/26/2018  . SHOULDER ARTHROSCOPY W/ ROTATOR CUFF REPAIR Right 11/2013  . STAPLING OF BLEBS Right 07/31/2016   Procedure: BLEBECTOMY;  Surgeon: Melrose Nakayama, MD;  Location: [facility];  Service: Thoracic;  Laterality: Right;  . VENA CAVA FILTER PLACEMENT Right 08/04/2016   Procedure: INSERTION VENA-CAVA FILTER;  Surgeon: Melrose Nakayama, MD;  Location: [facility];  Service: Thoracic;  Laterality: Right;  Marland Kitchen VIDEO ASSISTED THORACOSCOPY Right 07/31/2016   Procedure: VIDEO ASSISTED THORACOSCOPY;  Surgeon: Melrose Nakayama, MD;  Location: [facility];  Service: Thoracic;  Laterality: Right;  Marland Kitchen VIDEO ASSISTED THORACOSCOPY Right 08/04/2016   Procedure: REDO VIDEO ASSISTED THORACOSCOPY- EVACUATION OF HEMOTHORAX;  Surgeon: Melrose Nakayama, MD;  Location: [facility];  Service: Thoracic;  Laterality: Right;  Marland Kitchen VIDEO BRONCHOSCOPY WITH ENDOBRONCHIAL NAVIGATION N/A 03/11/2014  Procedure: VIDEO BRONCHOSCOPY WITH ENDOBRONCHIAL NAVIGATION;  Surgeon: Collene Gobble, MD;  [facility];  Service: Thoracic;  Laterality: N/A;   Social History   Socioeconomic History  . Marital status: Married     Spouse name: Not on file  . Number of children: 2  . Years of education: Not on file  . Highest education level: Not on file  Occupational History  . Occupation: retired  Tobacco Use  . Smoking status: Former Smoker    Packs/day: 1.00    Years: 15.00    Pack years: 15.00    Types: Cigarettes  . Smokeless tobacco: Never Used  . Tobacco comment: "quit smoking cigarettes in the 1990's"  Vaping Use  . Vaping Use: Never used  Substance and Sexual Activity  . Alcohol use: Yes    Alcohol/week: 0.0 standard drinks    Comment: patient states he stopped 4-5 days ago  . 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 Sexual activity: Yes  Other Topics Concern  . Not on file  Social History Narrative  . Not on file   Social Determinants of Health   Financial Resource Strain: Not on file  Food Insecurity: Not on file  Transportation Needs: Not on file  Physical Activity: Not on file  Stress: Not on file  Social Connections: Not on file   Family History  Problem Relation Age of Onset  . Heart disease Mother   . Heart disease Father   . Kidney disease Sister        Renal transplant  . Colon cancer Neg Hx   . Rectal cancer Neg Hx    Allergies  Allergen Reactions  . Pylera [Bis Subcit-Metronid-Tetracyc]     Itching    Prior to Admission medications   Medication Sig Start Date End Date Taking? Authorizing Provider  albuterol (VENTOLIN HFA) 108 (90 Base) MCG/ACT inhaler Inhale 2 puffs into the lungs every 6 (six) hours as needed for wheezing or shortness of breath. 08/26/19   Biagio Borg, MD  amLODipine (NORVASC) 10 MG tablet Take 1 tablet (10 mg total) by mouth daily. 02/03/20   Biagio Borg, MD  apixaban (ELIQUIS) 2.5 MG TABS tablet Take 1 tablet (2.5 mg total) by mouth 2 (two) times daily. 02/03/20 04/03/20  Biagio Borg, MD  ezetimibe (ZETIA) 10 MG tablet Take 1 tablet (10 mg total) by mouth daily. Annual appt due in Oct must see provider for  future refills 02/03/20   Biagio Borg, MD  hydrOXYzine (ATARAX/VISTARIL) 10 MG tablet Take 1 tablet (10 mg total) by mouth 3 (three) times daily as needed for itching. 05/03/20   Biagio Borg, MD  Multiple Vitamins-Minerals (MULTIVITAMIN WITH MINERALS) tablet Take 1 tablet by mouth daily. Men 13 +    [provider]  rosuvastatin (CRESTOR) 40 MG tablet TAKE 1 TABLET DAILY 05/17/20   Biagio Borg, MD  tadalafil (CIALIS) 20 MG tablet TAKE 1 TABLET BY MOUTH ONCE DAILY AS NEEDED FOR ERECTILE DYSFUNCTION Patient taking differently: Take 20 mg by mouth daily as needed for erectile dysfunction. 07/17/19   Biagio Borg, MD  temazepam (RESTORIL) 15 MG capsule TAKE 1 TO 2 CAPSULES BY MOUTH AT BEDTIME AS NEEDED FOR SLEEP 02/16/20   Biagio Borg, MD  tiZANidine (ZANAFLEX) 2 MG tablet TAKE 1 TABLET BY MOUTH TWICE DAILY AS NEEDED FOR MUSCLE SPASM 02/16/20   Biagio Borg, MD  Positive ROS: Otherwise negative  All other systems have been reviewed and were otherwise negative with the exception of those mentioned in the HPI and as above.  Physical Exam: Constitutional: Alert, well-appearing, no acute distress Ears: External ears without lesions or tenderness. Ear canals are clear bilaterally.  Left TM is clear and intact.  Right TM with an anterior inferior TM perforation which is dry. Nasal: External nose without lesions. Septum with mild deformity and mild rhinitis.. Clear nasal passages otherwise. Oral: Lips and gums without lesions. Tongue and palate mucosa without lesions. Posterior oropharynx clear. Neck: No palpable adenopathy or masses Respiratory: Breathing comfortably  Skin: No facial/neck lesions or rash noted.  Audiologic testing today demonstrated mild conductive loss in both ears but slightly worse on the right side.  He has mild hearing loss in both ears slightly worse on the right side with SRT's of 30 dB on the right and 25 dB on the left.  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  As this has been beneficial in the past. Recommended use of hearing aids.   Radene Journey, MD

## 2020-08-09 ENCOUNTER — Other Ambulatory Visit: Payer: Self-pay | Admitting: Internal Medicine

## 2020-08-20 ENCOUNTER — Encounter: Payer: Medicare Other | Admitting: Internal Medicine

## 2020-08-20 ENCOUNTER — Ambulatory Visit: Payer: Medicare Other | Admitting: Internal Medicine

## 2020-08-27 ENCOUNTER — Ambulatory Visit: Payer: Medicare Other | Admitting: Internal Medicine

## 2020-09-08 ENCOUNTER — Encounter: Payer: Self-pay | Admitting: Internal Medicine

## 2020-09-08 ENCOUNTER — Ambulatory Visit (INDEPENDENT_AMBULATORY_CARE_PROVIDER_SITE_OTHER): Payer: Medicare Other | Admitting: Internal Medicine

## 2020-09-08 ENCOUNTER — Other Ambulatory Visit: Payer: Self-pay

## 2020-09-08 VITALS — BP 132/78 | HR 79 | Temp 98.4°F | Ht 76.0 in | Wt 175.0 lb

## 2020-09-08 DIAGNOSIS — E7800 Pure hypercholesterolemia, unspecified: Secondary | ICD-10-CM | POA: Diagnosis not present

## 2020-09-08 DIAGNOSIS — N189 Chronic kidney disease, unspecified: Secondary | ICD-10-CM | POA: Diagnosis not present

## 2020-09-08 DIAGNOSIS — R739 Hyperglycemia, unspecified: Secondary | ICD-10-CM

## 2020-09-08 DIAGNOSIS — I10 Essential (primary) hypertension: Secondary | ICD-10-CM | POA: Diagnosis not present

## 2020-09-08 DIAGNOSIS — D631 Anemia in chronic kidney disease: Secondary | ICD-10-CM | POA: Diagnosis not present

## 2020-09-08 DIAGNOSIS — E559 Vitamin D deficiency, unspecified: Secondary | ICD-10-CM

## 2020-09-08 DIAGNOSIS — Z23 Encounter for immunization: Secondary | ICD-10-CM

## 2020-09-08 DIAGNOSIS — E538 Deficiency of other specified B group vitamins: Secondary | ICD-10-CM

## 2020-09-08 DIAGNOSIS — I700 Atherosclerosis of aorta: Secondary | ICD-10-CM

## 2020-09-08 DIAGNOSIS — N320 Bladder-neck obstruction: Secondary | ICD-10-CM | POA: Diagnosis not present

## 2020-09-08 DIAGNOSIS — N1831 Chronic kidney disease, stage 3a: Secondary | ICD-10-CM

## 2020-09-08 LAB — URINALYSIS, ROUTINE W REFLEX MICROSCOPIC
Bilirubin Urine: NEGATIVE
Ketones, ur: NEGATIVE
Leukocytes,Ua: NEGATIVE
Nitrite: NEGATIVE
Specific Gravity, Urine: 1.015 (ref 1.000–1.030)
Urine Glucose: NEGATIVE
Urobilinogen, UA: 0.2 (ref 0.0–1.0)
pH: 6 (ref 5.0–8.0)

## 2020-09-08 LAB — CBC WITH DIFFERENTIAL/PLATELET
Basophils Absolute: 0.1 10*3/uL (ref 0.0–0.1)
Basophils Relative: 1.1 % (ref 0.0–3.0)
Eosinophils Absolute: 0.2 10*3/uL (ref 0.0–0.7)
Eosinophils Relative: 4.4 % (ref 0.0–5.0)
HCT: 40.5 % (ref 39.0–52.0)
Hemoglobin: 13.7 g/dL (ref 13.0–17.0)
Lymphocytes Relative: 43.4 % (ref 12.0–46.0)
Lymphs Abs: 2.4 10*3/uL (ref 0.7–4.0)
MCHC: 33.8 g/dL (ref 30.0–36.0)
MCV: 92.2 fl (ref 78.0–100.0)
Monocytes Absolute: 0.8 10*3/uL (ref 0.1–1.0)
Monocytes Relative: 15.1 % — ABNORMAL HIGH (ref 3.0–12.0)
Neutro Abs: 2 10*3/uL (ref 1.4–7.7)
Neutrophils Relative %: 36 % — ABNORMAL LOW (ref 43.0–77.0)
Platelets: 200 10*3/uL (ref 150.0–400.0)
RBC: 4.39 Mil/uL (ref 4.22–5.81)
RDW: 14.2 % (ref 11.5–15.5)
WBC: 5.4 10*3/uL (ref 4.0–10.5)

## 2020-09-08 LAB — FERRITIN: Ferritin: 39.2 ng/mL (ref 22.0–322.0)

## 2020-09-08 LAB — IBC PANEL
Iron: 111 ug/dL (ref 42–165)
Saturation Ratios: 31.7 % (ref 20.0–50.0)
Transferrin: 250 mg/dL (ref 212.0–360.0)

## 2020-09-08 LAB — HEPATIC FUNCTION PANEL
ALT: 32 U/L (ref 0–53)
AST: 37 U/L (ref 0–37)
Albumin: 4.1 g/dL (ref 3.5–5.2)
Alkaline Phosphatase: 38 U/L — ABNORMAL LOW (ref 39–117)
Bilirubin, Direct: 0.1 mg/dL (ref 0.0–0.3)
Total Bilirubin: 0.6 mg/dL (ref 0.2–1.2)
Total Protein: 7.5 g/dL (ref 6.0–8.3)

## 2020-09-08 LAB — BASIC METABOLIC PANEL
BUN: 37 mg/dL — ABNORMAL HIGH (ref 6–23)
CO2: 20 mEq/L (ref 19–32)
Calcium: 8.8 mg/dL (ref 8.4–10.5)
Chloride: 105 mEq/L (ref 96–112)
Creatinine, Ser: 2.18 mg/dL — ABNORMAL HIGH (ref 0.40–1.50)
GFR: 30.46 mL/min — ABNORMAL LOW (ref >60.00)
Glucose, Bld: 93 mg/dL (ref 70–99)
Potassium: 3.8 mEq/L (ref 3.5–5.1)
Sodium: 137 mEq/L (ref 135–145)

## 2020-09-08 LAB — LIPID PANEL
Cholesterol: 159 mg/dL (ref 0–200)
HDL: 74.2 mg/dL (ref >39.00)
NonHDL: 84.39
Total CHOL/HDL Ratio: 2
Triglycerides: 231 mg/dL — ABNORMAL HIGH (ref 0.0–149.0)
VLDL: 46.2 mg/dL — ABNORMAL HIGH (ref 0.0–40.0)

## 2020-09-08 LAB — VITAMIN B12: Vitamin B-12: 573 pg/mL (ref 211–911)

## 2020-09-08 LAB — LDL CHOLESTEROL, DIRECT: Direct LDL: 65 mg/dL

## 2020-09-08 LAB — TSH: TSH: 1.41 u[IU]/mL (ref 0.35–5.50)

## 2020-09-08 LAB — PSA: PSA: 3.02 ng/mL (ref 0.10–4.00)

## 2020-09-08 LAB — VITAMIN D 25 HYDROXY (VIT D DEFICIENCY, FRACTURES): VITD: 56.67 ng/mL (ref 30.00–100.00)

## 2020-09-08 LAB — HEMOGLOBIN A1C: Hgb A1c MFr Bld: 5 % (ref 4.6–6.5)

## 2020-09-08 NOTE — Progress Notes (Signed)
Patient ID: Charles [name], male   DOB: 1950-07-15, 70 y.o.   MRN: [id number]         Chief Complaint:: yearly exam       HPI:  Charles [name] is a 70 y.o. male here overall doing ok; Pt denies chest pain, increased sob or doe, wheezing, orthopnea, PND, increased LE swelling, palpitations, dizziness or syncope.   Pt denies polydipsia, polyuria, or new focal neuro s/s.   Pt denies fever, wt loss, night sweats, loss of appetite, or other constitutional symptoms  Has been drinking ETOH only very infrequently.   No bleeding or bruising.  No other new complaints.  Trying to follow lower chol diet.  Denies worsening reflux, abd pain, dysphagia, n/v, bowel change or blood.  Denies urinary symptoms such as dysuria, frequency, urgency, flank pain, hematuria or n/v, fever, chills.    Wt Readings from Last 3 Encounters:  09/08/20 175 lb (79.4 kg)  02/26/20 176 lb (79.8 kg)  12/25/19 174 lb (78.9 kg)   BP Readings from Last 3 Encounters:  09/08/20 132/78  02/26/20 140/82  12/25/19 122/76   Immunization History  Administered Date(s) Administered   Moderna Sars-Covid-2 Vaccination 04/14/2019, 05/13/2019, 02/09/2020   PNEUMOCOCCAL CONJUGATE-20 09/08/2020   Td 02/20/1998, 11/02/2008   Health Maintenance Due  Topic Date Due   PNA vac Low Risk Adult (1 of 2 - PCV13) 08/26/2017      Past Medical History:  Diagnosis Date   ABNORMAL ELECTROCARDIOGRAM 11/02/2008   ABSCESS, LUNG 10/23/2006   Anemia 05/26/2014   BACTERIAL PNEUMONIA 12/28/2009   BPH (benign prostatic hyperplasia) 11/22/2010   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   CHEST PAIN 12/24/2009   Coronary artery calcification seen on CT scan 06/01/2015   Cramp of limb 07/19/2007   DIVERTICULOSIS, COLON 03/07/2007   DVT (deep venous thrombosis) ([place]) 01/2014   LLE   Emphysema of lung ([place])    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/23/2006   FLANK PAIN, LEFT 02/25/2010   FREQUENCY, URINARY 12/24/2009   HEMORRHOIDS 10/23/2006   HYPERLIPIDEMIA 10/23/2006   HYPERTENSION  10/23/2006   PE (pulmonary embolism) 02/2014   PLANTAR FASCIITIS, LEFT 11/02/2008   Pneumonia 12/2013 X 2   PSA, INCREASED 11/20/2007   PULMONARY NODULE 05/14/2008   Unspecified Peripheral Vascular Disease 10/23/2006   Past Surgical History:  Procedure Laterality Date   BIOPSY  09/29/2019   Procedure: BIOPSY;  Surgeon: Jerene Bears, MD;  [facility];  Service: Endoscopy;;   BIOPSY  12/25/2019   Procedure: BIOPSY;  Surgeon: Milus Banister, MD;  [facility];  Service: Endoscopy;;   ESOPHAGOGASTRODUODENOSCOPY (EGD) WITH PROPOFOL N/A 06/15/2017   Procedure: ESOPHAGOGASTRODUODENOSCOPY (EGD) WITH PROPOFOL;  Surgeon: Jerene Bears, MD;  Location: [facility];  Service: Gastroenterology;  Laterality: N/A;   ESOPHAGOGASTRODUODENOSCOPY (EGD) WITH PROPOFOL N/A 09/29/2019   Procedure: ESOPHAGOGASTRODUODENOSCOPY (EGD) WITH PROPOFOL;  Surgeon: Jerene Bears, MD;  [facility];  Service: Endoscopy;  Laterality: N/A;   ESOPHAGOGASTRODUODENOSCOPY (EGD) WITH PROPOFOL N/A 12/25/2019   Procedure: ESOPHAGOGASTRODUODENOSCOPY (EGD) WITH PROPOFOL;  Surgeon: Milus Banister, MD;  [facility];  Service: Endoscopy;  Laterality: N/A;   EUS N/A 12/25/2019   Procedure: UPPER ENDOSCOPIC ULTRASOUND (EUS) RADIAL;  Surgeon: Milus Banister, MD;  [facility];  Service: Endoscopy;  Laterality: N/A;   HEMORRHOID SURGERY  ?1990's   INGUINAL HERNIA REPAIR Bilateral ?2000's   IVC FILTER INSERTION N/A 10/03/2016   Procedure: IVC Filter Retrieveal;  Surgeon: Serafina Mitchell, MD;  Location: [facility]  IN[facility];  Service: Cardiovascular;  Laterality: N/A;   MYRINGOTOMY WITH TUBE PLACEMENT Right 06/26/2018   SHOULDER ARTHROSCOPY W/ ROTATOR CUFF REPAIR Right 11/2013   STAPLING OF BLEBS Right 07/31/2016   Procedure: BLEBECTOMY;  Surgeon: Melrose Nakayama, MD;  Location: [facility];  Service: Thoracic;  Laterality: Right;   VENA CAVA FILTER PLACEMENT Right 08/04/2016   Procedure: INSERTION  VENA-CAVA FILTER;  Surgeon: Melrose Nakayama, MD;  Location: [place];  Service: Thoracic;  Laterality: Right;   VIDEO ASSISTED THORACOSCOPY Right 07/31/2016   Procedure: VIDEO ASSISTED THORACOSCOPY;  Surgeon: Melrose Nakayama, MD;  Location: [facility];  Service: Thoracic;  Laterality: Right;   VIDEO ASSISTED THORACOSCOPY Right 08/04/2016   Procedure: REDO VIDEO ASSISTED THORACOSCOPY- EVACUATION OF HEMOTHORAX;  Surgeon: Melrose Nakayama, MD;  Location: [facility];  Service: Thoracic;  Laterality: Right;   VIDEO BRONCHOSCOPY WITH ENDOBRONCHIAL NAVIGATION N/A 03/11/2014   Procedure: VIDEO BRONCHOSCOPY WITH ENDOBRONCHIAL NAVIGATION;  Surgeon: Collene Gobble, MD;  Location: [facility];  Service: Thoracic;  Laterality: N/A;    reports that he has quit smoking. His smoking use included cigarettes. He has a 15.00 pack-year smoking history. He has never used smokeless tobacco. He reports current alcohol use. He reports current drug use. Drug: Marijuana. family history includes Heart disease in his father and mother; Kidney disease in his sister. Allergies  Allergen Reactions   Pylera [Bis Subcit-Metronid-Tetracyc]     Itching    Current Outpatient Medications on File Prior to Visit  Medication Sig Dispense Refill   albuterol (VENTOLIN HFA) 108 (90 Base) MCG/ACT inhaler Inhale 2 puffs into the lungs every 6 (six) hours as needed for wheezing or shortness of breath. 8 g 5   amLODipine (NORVASC) 10 MG tablet Take 1 tablet (10 mg total) by mouth daily. 90 tablet 3   ezetimibe (ZETIA) 10 MG tablet Take 1 tablet (10 mg total) by mouth daily. Annual appt due in Oct must see provider for future refills 90 tablet 3   hydrOXYzine (ATARAX/VISTARIL) 10 MG tablet Take 1 tablet (10 mg total) by mouth 3 (three) times daily as needed for itching. 60 tablet 0   Multiple Vitamins-Minerals (MULTIVITAMIN WITH MINERALS) tablet Take 1 tablet by mouth daily. Men 50 +     rosuvastatin (CRESTOR) 40 MG tablet TAKE 1 TABLET DAILY 90  tablet 3   tadalafil (CIALIS) 20 MG tablet TAKE 1 TABLET BY MOUTH ONCE DAILY AS NEEDED FOR ERECTILE DYSFUNCTION 6 tablet 8   temazepam (RESTORIL) 15 MG capsule TAKE 1 TO 2 CAPSULES BY MOUTH AT BEDTIME AS NEEDED FOR SLEEP 60 capsule 2   tiZANidine (ZANAFLEX) 2 MG tablet TAKE 1 TABLET BY MOUTH TWICE DAILY AS NEEDED FOR MUSCLE SPASM 60 tablet 2   apixaban (ELIQUIS) 2.5 MG TABS tablet Take 1 tablet (2.5 mg total) by mouth 2 (two) times daily. 180 tablet 3   No current facility-administered medications on file prior to visit.        ROS:  All others reviewed and negative.  Objective        PE:  BP 132/78 (BP Location: Left Arm, Patient Position: Sitting, Cuff Size: Normal)   Pulse 79   Temp 98.4 F (36.9 C) (Oral)   Ht 6\' 4"  (1.93 m)   Wt 175 lb (79.4 kg)   SpO2 98%   BMI 21.30 kg/m                 Constitutional: Pt appears in NAD  HENT: Head: NCAT.                Right Ear: External ear normal.                 Left Ear: External ear normal.                Eyes: . Pupils are equal, round, and reactive to light. Conjunctivae and EOM are normal               Nose: without d/c or deformity               Neck: Neck supple. Gross normal ROM               Cardiovascular: Normal rate and regular rhythm.                 Pulmonary/Chest: Effort normal and breath sounds without rales or wheezing.                Abd:  Soft, NT, ND, + BS, no organomegaly               Neurological: Pt is alert. At baseline orientation, motor grossly intact               Skin: Skin is warm. No rashes, no other new lesions, LE edema - none               Psychiatric: Pt behavior is normal without agitation   Micro: none  Cardiac tracings I have personally interpreted today:  none  Pertinent Radiological findings (summarize): none   Lab Results  Component Value Date   WBC 5.4 09/08/2020   HGB 13.7 09/08/2020   HCT 40.5 09/08/2020   PLT 200.0 09/08/2020   GLUCOSE 93 09/08/2020   CHOL 159  09/08/2020   TRIG 231.0 (H) 09/08/2020   HDL 74.20 09/08/2020   LDLDIRECT 65.0 09/08/2020   LDLCALC 123 (H) 08/26/2019   ALT 32 09/08/2020   AST 37 09/08/2020   NA 137 09/08/2020   K 3.8 09/08/2020   CL 105 09/08/2020   CREATININE 2.18 (H) 09/08/2020   BUN 37 (H) 09/08/2020   CO2 20 09/08/2020   TSH 1.41 09/08/2020   PSA 3.02 09/08/2020   INR 1.2 09/29/2019   HGBA1C 5.0 09/08/2020   MICROALBUR 1.5 08/25/2015   Assessment/Plan:  Charles [name] is a 70 y.o. Black or African American [2] male with  has a past medical history of ABNORMAL ELECTROCARDIOGRAM (11/02/2008), ABSCESS, LUNG (10/23/2006), Anemia (05/26/2014), BACTERIAL PNEUMONIA (12/28/2009), BPH (benign prostatic hyperplasia) (11/22/2010), Cerebellar stroke ([place]) (05/26/2014), CHEST PAIN (12/24/2009), Coronary artery calcification seen on CT scan (06/01/2015), Cramp of limb (07/19/2007), DIVERTICULOSIS, COLON (03/07/2007), DVT (deep venous thrombosis) ([place]) (01/2014), Emphysema of lung ([place]), ERECTILE DYSFUNCTION (10/23/2006), FLANK PAIN, LEFT (02/25/2010), FREQUENCY, URINARY (12/24/2009), HEMORRHOIDS (10/23/2006), HYPERLIPIDEMIA (10/23/2006), HYPERTENSION (10/23/2006), PE (pulmonary embolism) (02/2014), PLANTAR FASCIITIS, LEFT (11/02/2008), Pneumonia (12/2013 X 2), PSA, INCREASED (11/20/2007), PULMONARY NODULE (05/14/2008), and Unspecified Peripheral Vascular Disease (10/23/2006).  Vitamin D deficiency Last vitamin D Lab Results  Component Value Date   VD25OH 56.67 09/08/2020   Stable, cont oral replacement   Hyperlipidemia Lab Results  Component Value Date   LDLCALC 123 (H) 08/26/2019   Uncontrolled, goal ldl < 70, pt to continue current zetia and crestor, urged good compliance, declines lipid clinic for now   Hyperglycemia Lab Results  Component Value Date   HGBA1C 5.0 09/08/2020   Stable, pt to continue current medical  treatment  - diet   Essential hypertension BP Readings from Last 3 Encounters:  09/08/20 132/78  02/26/20 140/82   12/25/19 122/76   Stable, pt to continue medical treatment amlodipine   CKD (chronic kidney disease), stage III Lab Results  Component Value Date   CREATININE 2.18 (H) 09/08/2020   Stable overall, cont to avoid nephrotoxins   Aortic atherosclerosis (HCC) Pt to continue low chol diet, exercise, and zeta/crestor  Anemia Also for iron labs  Followup: Return in about 6 months (around 03/11/2021).  Cathlean Cower, MD 09/12/2020 7:48 PM [facility]

## 2020-09-08 NOTE — Patient Instructions (Addendum)
Please check with the [employer] on the shingles shot  You had the Prevnar 20 pneumonia shot today  Please continue all other medications as before, and refills have been done if requested.  Please have the pharmacy call with any other refills you may need.  Please continue your efforts at being more active, low cholesterol diet, and weight control.  You are otherwise up to date with prevention measures today.  Please keep your appointments with your specialists as you may have planned  Please go to the LAB at the blood drawing area for the tests to be done  You will be contacted by phone if any changes need to be made immediately.  Otherwise, you will receive a letter about your results with an explanation, but please check with MyChart first.  Please remember to sign up for MyChart if you have not done so, as this will be important to you in the future with finding out test results, communicating by private email, and scheduling acute appointments online when needed.  Please make an Appointment to return in 6 months, or sooner if needed

## 2020-09-12 ENCOUNTER — Encounter: Payer: Self-pay | Admitting: Internal Medicine

## 2020-09-12 NOTE — Assessment & Plan Note (Signed)
BP Readings from Last 3 Encounters:  09/08/20 132/78  02/26/20 140/82  12/25/19 122/76   Stable, pt to continue medical treatment amlodipine

## 2020-09-12 NOTE — Assessment & Plan Note (Signed)
Lab Results  Component Value Date   LDLCALC 123 (H) 08/26/2019   Uncontrolled, goal ldl < 70, pt to continue current zetia and crestor, urged good compliance, declines lipid clinic for now

## 2020-09-12 NOTE — Assessment & Plan Note (Signed)
Pt to continue low chol diet, exercise, and zeta/crestor

## 2020-09-12 NOTE — Assessment & Plan Note (Signed)
Lab Results  Component Value Date   HGBA1C 5.0 09/08/2020   Stable, pt to continue current medical treatment  - diet

## 2020-09-12 NOTE — Assessment & Plan Note (Signed)
Lab Results  Component Value Date   CREATININE 2.18 (H) 09/08/2020   Stable overall, cont to avoid nephrotoxins

## 2020-09-12 NOTE — Assessment & Plan Note (Signed)
Last vitamin D Lab Results  Component Value Date   VD25OH 56.67 09/08/2020   Stable, cont oral replacement

## 2020-09-12 NOTE — Assessment & Plan Note (Signed)
Also for iron labs 

## 2020-09-21 ENCOUNTER — Telehealth: Payer: Self-pay | Admitting: Internal Medicine

## 2020-09-21 NOTE — Telephone Encounter (Signed)
1.Medication Requested: hydrOXYzine (ATARAX/VISTARIL) 10 MG tablet, albuterol (VENTOLIN HFA) 108 (90 Base) MCG/ACT inhaler, tadalafil (CIALIS) 20 MG tablet  2. Pharmacy (Name, [place], [place]): [id number] [place] Phone:  [phone]:  [phone]  3. On Med List: yes  4. Last Visit with PCP: 09/08/20  5. Next visit date with PCP: 03/11/21  **Patient says he requested these meds be refilled at prev appt but pharmacy advised they never received them   Agent: Please be advised that RX refills may take up to 3 business days. We ask that you follow-up with your pharmacy.

## 2020-09-22 MED ORDER — ALBUTEROL SULFATE HFA 108 (90 BASE) MCG/ACT IN AERS: 2.0000 | INHALATION_SPRAY | Freq: Four times a day (QID) | RESPIRATORY_TRACT | 5 refills | Status: DC | PRN

## 2020-09-22 MED ORDER — TADALAFIL 20 MG PO TABS: 20.0000 mg | ORAL_TABLET | Freq: Every day | ORAL | 8 refills | Status: DC | PRN

## 2020-09-22 MED ORDER — HYDROXYZINE HCL 10 MG PO TABS: 10.0000 mg | ORAL_TABLET | Freq: Three times a day (TID) | ORAL | 1 refills | Status: AC | PRN

## 2020-09-23 ENCOUNTER — Encounter (INDEPENDENT_AMBULATORY_CARE_PROVIDER_SITE_OTHER): Payer: Self-pay

## 2020-10-07 ENCOUNTER — Telehealth: Payer: Self-pay | Admitting: Internal Medicine

## 2020-10-07 MED ORDER — TEMAZEPAM 15 MG PO CAPS: ORAL_CAPSULE | ORAL | 2 refills | Status: DC

## 2020-10-07 NOTE — Telephone Encounter (Signed)
Last refill per controlled substance database: 02/19/20

## 2020-10-07 NOTE — Addendum Note (Signed)
Addended by: Biagio Borg on: 10/07/2020 04:40 PM   Modules accepted: Orders

## 2020-10-07 NOTE — Telephone Encounter (Signed)
1.Medication Requested: temazepam (RESTORIL) 15 MG capsule 2. Pharmacy (Name, [place], [place]): [id number] [place]  [phone] Fax:  [phone]    3. On Med List: y  470 Last Visit with PCP: 09/08/2020  5. Next visit date with PCP: 03/11/2021   Agent: Please be advised that RX refills may take up to 3 business days. We ask that you follow-up with your pharmacy.

## 2021-02-08 ENCOUNTER — Other Ambulatory Visit: Payer: Self-pay | Admitting: Internal Medicine

## 2021-02-08 NOTE — Telephone Encounter (Signed)
Please refill as per office routine med refill policy (all routine meds to be refilled for 3 mo or monthly (per pt preference) up to one year from last visit, then month to month grace period for 3 mo, then further med refills will have to be denied) ? ?

## 2021-03-04 ENCOUNTER — Emergency Department (HOSPITAL_COMMUNITY)
Admission: EM | Admit: 2021-03-04 | Discharge: 2021-03-04 | Disposition: A | Discharge status: Home / Self Care | Payer: Medicare Other | Attending: Emergency Medicine | Admitting: Emergency Medicine

## 2021-03-04 ENCOUNTER — Other Ambulatory Visit: Payer: Self-pay

## 2021-03-04 ENCOUNTER — Encounter (HOSPITAL_COMMUNITY): Payer: Self-pay

## 2021-03-04 ENCOUNTER — Emergency Department (HOSPITAL_COMMUNITY): Payer: Medicare Other

## 2021-03-04 DIAGNOSIS — R110 Nausea: Secondary | ICD-10-CM | POA: Insufficient documentation

## 2021-03-04 DIAGNOSIS — I10 Essential (primary) hypertension: Secondary | ICD-10-CM | POA: Insufficient documentation

## 2021-03-04 DIAGNOSIS — R072 Precordial pain: Secondary | ICD-10-CM | POA: Diagnosis present

## 2021-03-04 DIAGNOSIS — Z79899 Other long term (current) drug therapy: Secondary | ICD-10-CM | POA: Diagnosis not present

## 2021-03-04 DIAGNOSIS — I2510 Atherosclerotic heart disease of native coronary artery without angina pectoris: Secondary | ICD-10-CM | POA: Insufficient documentation

## 2021-03-04 DIAGNOSIS — Z7901 Long term (current) use of anticoagulants: Secondary | ICD-10-CM | POA: Diagnosis not present

## 2021-03-04 LAB — BASIC METABOLIC PANEL
Anion gap: 16 — ABNORMAL HIGH (ref 5–15)
BUN: 43 mg/dL — ABNORMAL HIGH (ref 8–23)
CO2: 17 mmol/L — ABNORMAL LOW (ref 22–32)
Calcium: 8.5 mg/dL — ABNORMAL LOW (ref 8.9–10.3)
Chloride: 103 mmol/L (ref 98–111)
Creatinine, Ser: 2.94 mg/dL — ABNORMAL HIGH (ref 0.61–1.24)
GFR, Estimated: 22 mL/min — ABNORMAL LOW (ref >60)
Glucose, Bld: 135 mg/dL — ABNORMAL HIGH (ref 70–99)
Potassium: 4 mmol/L (ref 3.5–5.1)
Sodium: 136 mmol/L (ref 135–145)

## 2021-03-04 LAB — CBC
HCT: 43 % (ref 39.0–52.0)
Hemoglobin: 14.7 g/dL (ref 13.0–17.0)
MCH: 31.5 pg (ref 26.0–34.0)
MCHC: 34.2 g/dL (ref 30.0–36.0)
MCV: 92.3 fL (ref 80.0–100.0)
Platelets: 176 10*3/uL (ref 150–400)
RBC: 4.66 MIL/uL (ref 4.22–5.81)
RDW: 13.3 % (ref 11.5–15.5)
WBC: 11.1 10*3/uL — ABNORMAL HIGH (ref 4.0–10.5)
nRBC: 0.3 % — ABNORMAL HIGH (ref 0.0–0.2)

## 2021-03-04 LAB — TROPONIN I (HIGH SENSITIVITY)
Troponin I (High Sensitivity): 13 ng/L (ref <18)
Troponin I (High Sensitivity): 10 ng/L (ref <18)

## 2021-03-04 MED ORDER — ALUM & MAG HYDROXIDE-SIMETH 200-200-20 MG/5ML PO SUSP
30.0000 mL | Freq: Once | ORAL | Status: AC
  Administered 2021-03-04: 30 mL via ORAL
  Filled 2021-03-04: qty 30

## 2021-03-04 MED ORDER — ONDANSETRON HCL 4 MG PO TABS: 4.0000 mg | ORAL_TABLET | Freq: Four times a day (QID) | ORAL | 0 refills | Status: DC

## 2021-03-04 MED ORDER — ONDANSETRON 4 MG PO TBDP
4.0000 mg | ORAL_TABLET | Freq: Once | ORAL | Status: AC
  Administered 2021-03-04: 4 mg via ORAL
  Filled 2021-03-04: qty 1

## 2021-03-04 MED ORDER — CHLORPROMAZINE HCL 10 MG PO TABS: 10.0000 mg | ORAL_TABLET | Freq: Three times a day (TID) | ORAL | 0 refills | Status: DC | PRN

## 2021-03-04 MED ORDER — PANTOPRAZOLE SODIUM 20 MG PO TBEC: 20.0000 mg | DELAYED_RELEASE_TABLET | Freq: Every day | ORAL | 0 refills | Status: DC

## 2021-03-04 MED ORDER — LIDOCAINE VISCOUS HCL 2 % MT SOLN
15.0000 mL | Freq: Once | OROMUCOSAL | Status: AC
  Administered 2021-03-04: 15 mL via ORAL
  Filled 2021-03-04: qty 15

## 2021-03-04 MED ORDER — ONDANSETRON HCL 4 MG PO TABS: 4.0000 mg | ORAL_TABLET | Freq: Four times a day (QID) | ORAL | 0 refills | Status: AC

## 2021-03-04 MED ORDER — CHLORPROMAZINE HCL 25 MG PO TABS
25.0000 mg | ORAL_TABLET | Freq: Once | ORAL | Status: AC
  Administered 2021-03-04: 25 mg via ORAL
  Filled 2021-03-04: qty 1

## 2021-03-04 MED ORDER — CHLORPROMAZINE HCL 10 MG PO TABS: 10.0000 mg | ORAL_TABLET | Freq: Three times a day (TID) | ORAL | 0 refills | Status: AC | PRN

## 2021-03-04 NOTE — Discharge Instructions (Addendum)
I have prescribed you medications to help with nausea/echo feeling.  Please return if symptoms worsen as discussed.  Your cardiac work-up today is unremarkable as discussed.

## 2021-03-04 NOTE — ED Triage Notes (Signed)
Pt reports sternal chest pains beginning yesterday after  hiccupping for 24 hours.

## 2021-03-04 NOTE — ED Notes (Signed)
Save blue tube in main lab °

## 2021-03-04 NOTE — ED Provider Notes (Signed)
[facility] DEPT Provider Note   CSN: [id number] Arrival date & time: 03/04/21  0556     History  Chief Complaint  Patient presents with   Chest Pain    Charles [name] is a 71 y.o. male.  The history is provided by the patient.  Chest Pain Pain location:  Substernal area Pain quality: burning   Pain radiates to:  Does not radiate Pain severity:  Mild Onset quality:  Gradual Duration:  2 days Timing:  Intermittent Progression:  Waxing and waning Chronicity:  New Context: at rest   Relieved by:  Nothing Worsened by:  Nothing Associated symptoms: heartburn (?)   Associated symptoms: no abdominal pain, no anorexia, no anxiety, no back pain, no claudication, no cough, no dizziness, no dysphagia, no fatigue, no fever, no numbness, no orthopnea, no palpitations, no PND, no shortness of breath and no vomiting   Risk factors: coronary artery disease and 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 Authorizing Provider  chlorproMAZINE (THORAZINE) 10 MG tablet Take 1 tablet (10 mg total) by mouth 3 (three) times daily as needed for up to 10 doses. To help with hiccups 03/04/21  Yes Zachary Nole, DO  ondansetron (ZOFRAN) 4 MG tablet Take 1 tablet (4 mg total) by mouth every 6 (six) hours. 03/04/21  Yes Ignatz Deis, DO  pantoprazole (PROTONIX) 20 MG tablet Take 1 tablet (20 mg total) by mouth daily for 14 days. 03/04/21 03/18/21 Yes Niki Payment, DO  albuterol (VENTOLIN HFA) 108 (90 Base) MCG/ACT inhaler Inhale 2 puffs into the lungs every 6 (six) hours as needed for wheezing or shortness of breath. 09/22/20   Biagio Borg, MD  amLODipine (NORVASC) 10 MG tablet Take 1 tablet (10 mg total) by mouth daily. 02/03/20   Biagio Borg, MD  apixaban (ELIQUIS) 2.5 MG TABS tablet Take 1 tablet (2.5 mg total) by mouth 2 (two) times daily. 02/03/20  04/03/20  Biagio Borg, MD  ezetimibe (ZETIA) 10 MG tablet TAKE 1 TABLET DAILY [facility] APPOINTMENT DUE IN OCTOBER MUST SEE PROVIDER FOR FUTURE REFILLS) 02/09/21   Biagio Borg, MD  hydrOXYzine (ATARAX/VISTARIL) 10 MG tablet Take 1 tablet (10 mg total) by mouth 3 (three) times daily as needed for itching. 09/22/20   Biagio Borg, MD  Multiple Vitamins-Minerals (MULTIVITAMIN WITH MINERALS) tablet Take 1 tablet by mouth daily. Men 59 +    [provider]  rosuvastatin (CRESTOR) 40 MG tablet TAKE 1 TABLET DAILY 05/17/20   Biagio Borg, MD  tadalafil (CIALIS) 20 MG tablet Take 1 tablet (20 mg total) by mouth daily as needed for erectile dysfunction. 09/22/20   Biagio Borg, MD  temazepam (RESTORIL) 15 MG capsule 1 tab by  mouth at  bedtime as needed 10/07/20   Biagio Borg, MD  tiZANidine (ZANAFLEX) 2 MG tablet TAKE 1 TABLET BY MOUTH TWICE DAILY AS NEEDED FOR MUSCLE SPASM 02/16/20   Biagio Borg, MD      Allergies    Pylera [bis subcit-metronid-tetracyc]    Review of Systems   Review of Systems  Constitutional:  Negative for chills, fatigue and fever.  HENT:  Negative for ear pain, sore throat and trouble swallowing.   Eyes:  Negative for pain and visual disturbance.  Respiratory:  Negative for cough and shortness of breath.   Cardiovascular:  Positive for chest pain. Negative for palpitations, orthopnea, claudication and PND.  Gastrointestinal:  Positive for heartburn (?). Negative for abdominal pain, anorexia and vomiting.  Genitourinary:  Negative for dysuria and hematuria.  Musculoskeletal:  Negative for arthralgias and back pain.  Skin:  Negative for color change and rash.  Neurological:  Negative for dizziness, seizures, syncope and numbness.  All other systems reviewed and are negative.  Physical Exam Updated Vital Signs BP (!) 156/89    Pulse 87    Temp (!) 97.4 F (36.3 C)    Resp (!) 22    Ht 6\' 4"  (1.93 m)    Wt 79.4 kg    SpO2 95%    BMI 21.30 kg/m  Physical  Exam Vitals and nursing note reviewed.  Constitutional:      General: He is not in acute distress.    Appearance: He is well-developed. He is not ill-appearing.  HENT:     Head: Normocephalic and atraumatic.  Eyes:     Extraocular Movements: Extraocular movements intact.     Conjunctiva/sclera: Conjunctivae normal.     Pupils: Pupils are equal, round, and reactive to light.  Cardiovascular:     Rate and Rhythm: Normal rate and regular rhythm.     Pulses:          Radial pulses are 2+ on the right side and 2+ on the left side.     Heart sounds: Normal heart sounds. No murmur heard. Pulmonary:     Effort: Pulmonary effort is normal. No respiratory distress.     Breath sounds: Normal breath sounds. No decreased breath sounds, wheezing, rhonchi or rales.  Abdominal:     Palpations: Abdomen is soft.     Tenderness: There is no abdominal tenderness.  Musculoskeletal:        General: No swelling.     Cervical back: Normal range of motion and neck supple.  Skin:    General: Skin is warm and dry.     Capillary Refill: Capillary refill takes less than 2 seconds.  Neurological:     General: No focal deficit present.     Mental Status: He is alert and oriented to person, place, and time.     Cranial Nerves: No cranial nerve deficit.     Motor: No weakness.     Comments: 5+5 strength throughout, normal sensation, no drift, normal finger-to-nose finger, normal speech  Psychiatric:        Mood and Affect: Mood normal.    ED Results / Procedures / Treatments   Labs (all labs ordered are listed, but only abnormal results are displayed) Labs Reviewed  BASIC METABOLIC PANEL - Abnormal; Notable for the following components:      Result Value   CO2 17 (*)    Glucose, Bld 135 (*)    BUN 43 (*)    Creatinine, Ser 2.94 (*)    Calcium 8.5 (*)    GFR, Estimated 22 (*)    Anion gap 16 (*)    All other components within normal limits  CBC - Abnormal; Notable for the following components:    WBC 11.1 (*)    nRBC 0.3 (*)    All other components within normal limits  TROPONIN I (HIGH SENSITIVITY)  TROPONIN I (HIGH SENSITIVITY)    EKG EKG Interpretation  Date/Time:  Friday March 04 2021 06:11:50 EST Ventricular Rate:  93 PR Interval:  151 QRS Duration: 93 QT Interval:  366 QTC Calculation: 456 R Axis:   -  34 Text Interpretation: Sinus rhythm Left axis deviation Confirmed by Nanda Quinton [phone]) on 03/04/2021 6:21:14 AM  Radiology DG Chest 2 View  Result Date: 03/04/2021 CLINICAL DATA:  Mid chest pain and shortness of breath.  COPD. EXAM: CHEST - 2 VIEW COMPARISON:  PA Lat 09/27/2019, chest CT 10/24/2016 FINDINGS: The cardiac size is normal. The mediastinal configuration is stable with aortic atherosclerosis. There are emphysematous and chronic changes in the lungs and scattered postsurgical change in the right lung, and chronic right basilar pleural-parenchymal disease and slight volume loss. No focal pneumonia is evident and no pleural effusion is seen. Mild thoracic kyphosis with thoracic spondylosis. IMPRESSION: Stable COPD chest with postsurgical and chronic changes on the right. No evidence of acute chest disease. Electronically Signed   By: Telford Nab M.D.   On: 03/04/2021 06:29    Procedures Procedures    Medications Ordered in ED Medications  chlorproMAZINE (THORAZINE) tablet 25 mg (25 mg Oral Given 03/04/21 0835)  alum & mag hydroxide-simeth (MAALOX/MYLANTA) 200-200-20 MG/5ML suspension 30 mL (30 mLs Oral Given 03/04/21 0833)    And  lidocaine (XYLOCAINE) 2 % viscous mouth solution 15 mL (15 mLs Oral Given 03/04/21 0828)  ondansetron (ZOFRAN-ODT) disintegrating tablet 4 mg (4 mg Oral Given 03/04/21 5397)    ED Course/ Medical Decision Making/ A&P                           Medical Decision Making  DAYSHON ROBACK is here with chest pain/nausea.  Patient with unremarkable vitals.  No fever.  Patient has had some chest discomfort, nausea feeling, hiccups at  times.  Denies any cough or shortness of breath.  Significant comorbidities of pulmonary embolism on Eliquis, hypertension, high cholesterol, CAD, stroke.  Overall appears well.  Denies any weakness or numbness.  Not much chest discomfort currently.  No signs of hiccups on exam.  No abdominal tenderness.  Neurologically he appears intact.  EKG has been done and upon my interpretation shows sinus rhythm.  No ischemic changes.  Differential diagnosis includes acute coronary syndrome although atypical story, less likely pulmonary embolism as he is on blood thinner, pneumonia, reflux, stroke.  History and physical not consistent with dissection.  To evaluate we will get blood work including troponins, chest x-ray.  Will give Thorazine, GI cocktail, Zofran and reevaluate.  9:47 AM lab work has been obtained and interpreted by myself.  This includes chest x-ray as well.  Per my review of chest x-ray patient with no pneumonia or pneumothorax.  Troponins are negative x2 and doubt ACS.  No significant anemia or electrolyte abnormality.  Creatinine around baseline.  On reevaluation patient feeling much better after antiemetics and Thorazine.  Repeat neurologic exam is normal.  He states he is not having any difficulty swallowing.  Feels like symptoms have improved greatly.  Told him that he if felt like symptoms were getting worse or if he was having trouble swallowing or other stroke type symptoms that he should come back for reevaluation.  Overall he is asymptomatic at this time.  Well-appearing.  Discharged in ED in good condition.  Will prescribe Zofran, Protonix, Thorazine.  Recommend follow-up with primary care doctor.  This chart was dictated using voice recognition software.  Despite best efforts to proofread,  errors can occur which can change the documentation meaning.         Final Clinical Impression(s) / ED Diagnoses Final diagnoses:  Nausea    Rx /  DC Orders ED Discharge Orders           Ordered    chlorproMAZINE (THORAZINE) 10 MG tablet  3 times daily PRN        03/04/21 0945    ondansetron (ZOFRAN) 4 MG tablet  Every 6 hours        03/04/21 0945    pantoprazole (PROTONIX) 20 MG tablet  Daily        03/04/21 0945              Lennice Sites, DO 03/04/21 [phone]

## 2021-03-07 ENCOUNTER — Other Ambulatory Visit: Payer: Self-pay

## 2021-03-07 ENCOUNTER — Encounter: Payer: Self-pay | Admitting: Internal Medicine

## 2021-03-07 ENCOUNTER — Ambulatory Visit (INDEPENDENT_AMBULATORY_CARE_PROVIDER_SITE_OTHER): Payer: Medicare Other | Admitting: Internal Medicine

## 2021-03-07 VITALS — BP 108/60 | HR 98 | Ht 76.0 in | Wt 174.0 lb

## 2021-03-07 DIAGNOSIS — E559 Vitamin D deficiency, unspecified: Secondary | ICD-10-CM | POA: Diagnosis not present

## 2021-03-07 DIAGNOSIS — N1831 Chronic kidney disease, stage 3a: Secondary | ICD-10-CM

## 2021-03-07 DIAGNOSIS — K2990 Gastroduodenitis, unspecified, without bleeding: Secondary | ICD-10-CM

## 2021-03-07 DIAGNOSIS — K219 Gastro-esophageal reflux disease without esophagitis: Secondary | ICD-10-CM

## 2021-03-07 DIAGNOSIS — K2970 Gastritis, unspecified, without bleeding: Secondary | ICD-10-CM

## 2021-03-07 DIAGNOSIS — I10 Essential (primary) hypertension: Secondary | ICD-10-CM

## 2021-03-07 MED ORDER — SUCRALFATE 1 G PO TABS: 1.0000 g | ORAL_TABLET | Freq: Four times a day (QID) | ORAL | 0 refills | Status: DC

## 2021-03-07 MED ORDER — PANTOPRAZOLE SODIUM 40 MG PO TBEC: 40.0000 mg | DELAYED_RELEASE_TABLET | Freq: Every day | ORAL | 3 refills | Status: DC

## 2021-03-07 NOTE — Patient Instructions (Addendum)
Ok to increase the protonix to 40 mg per day  Please take all new medication as prescribed - the carafate for 1 month only to help the stomach heal  Please continue all other medications as before, and refills have been done if requested.  Please have the pharmacy call with any other refills you may need.  Please continue your efforts at being more active, low cholesterol diet, and weight control.  Please keep your appointments with your specialists as you may have planned  We can hold on further blood tests today  Please make an Appointment to return in 6 months, or sooner if needed

## 2021-03-07 NOTE — Progress Notes (Signed)
Patient ID: Charles [name], male   DOB: 11-Jun-1950, 71 y.o.   MRN: [id number]        Chief Complaint: follow up hiccups, GI symptoms, ckd 4       HPI:  Charles [name] is a 71 y.o. male here with c/o persistent hiccups last thur/fri seen in ED tx but now resolved it seems today, though did not take the thorazine..  Does also have worsening reflux, upper abd pain, but no dysphagia, n/v, bowel change or blood. Pt denies chest pain, increased sob or doe, wheezing, orthopnea, PND, increased LE swelling, palpitations, dizziness or syncope.   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   CHEST PAIN 12/24/2009   Coronary artery calcification seen on CT scan 06/01/2015   Cramp of limb 07/19/2007   DIVERTICULOSIS, COLON 03/07/2007   DVT (deep venous thrombosis) ([place]) 01/2014   LLE   Emphysema of lung ([place])    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/23/2006   FLANK PAIN, LEFT 02/25/2010   FREQUENCY, URINARY 12/24/2009   HEMORRHOIDS 10/23/2006   HYPERLIPIDEMIA 10/23/2006   HYPERTENSION 10/23/2006   PE (pulmonary embolism) 02/2014   PLANTAR FASCIITIS, LEFT 11/02/2008   Pneumonia 12/2013 X 2   PSA, INCREASED 11/20/2007   PULMONARY NODULE 05/14/2008   Unspecified Peripheral Vascular Disease 10/23/2006   Past Surgical History:  Procedure Laterality Date   BIOPSY  09/29/2019   Procedure: BIOPSY;  Surgeon: Jerene Bears, MD;  Location: [facility];  Service: Endoscopy;;    BIOPSY  12/25/2019   Procedure: BIOPSY;  Surgeon: Milus Banister, MD;  Location: [facility];  Service: Endoscopy;;   ESOPHAGOGASTRODUODENOSCOPY (EGD) WITH PROPOFOL N/A 06/15/2017   Procedure: ESOPHAGOGASTRODUODENOSCOPY (EGD) WITH PROPOFOL;  Surgeon: Jerene Bears, MD;  Location: [facility];  Service: Gastroenterology;  Laterality: N/A;   ESOPHAGOGASTRODUODENOSCOPY (EGD) WITH PROPOFOL N/A 09/29/2019   Procedure: ESOPHAGOGASTRODUODENOSCOPY (EGD) WITH PROPOFOL;  Surgeon: Jerene Bears, MD;  Location: [facility];  Service: Endoscopy;  Laterality: N/A;   ESOPHAGOGASTRODUODENOSCOPY (EGD) WITH PROPOFOL N/A 12/25/2019   Procedure: ESOPHAGOGASTRODUODENOSCOPY (EGD) WITH PROPOFOL;  Surgeon: Milus Banister, MD;  Location: [facility];  Service: Endoscopy;  Laterality: N/A;   EUS N/A 12/25/2019   Procedure: UPPER ENDOSCOPIC ULTRASOUND (EUS) RADIAL;  Surgeon: Milus Banister, MD;  Location: [facility];  Service: Endoscopy;  Laterality: N/A;   HEMORRHOID SURGERY  ?1990's   INGUINAL HERNIA REPAIR Bilateral ?2000's   IVC FILTER INSERTION N/A 10/03/2016   Procedure: IVC Filter Retrieveal;  Surgeon: Serafina Mitchell, MD;  Location: [facility];  Service: Cardiovascular;  Laterality: N/A;   MYRINGOTOMY WITH TUBE PLACEMENT Right 06/26/2018   SHOULDER ARTHROSCOPY W/ ROTATOR CUFF REPAIR Right 11/2013   STAPLING OF BLEBS Right 07/31/2016   Procedure: BLEBECTOMY;  Surgeon: Melrose Nakayama, MD;  Location: [facility];  Service: Thoracic;  Laterality: Right;  VENA CAVA FILTER PLACEMENT Right 08/04/2016   Procedure: INSERTION VENA-CAVA FILTER;  Surgeon: Melrose Nakayama, MD;  Location: [facility];  Service: Thoracic;  Laterality: Right;   VIDEO ASSISTED THORACOSCOPY Right 07/31/2016   Procedure: VIDEO ASSISTED THORACOSCOPY;  Surgeon: Melrose Nakayama, MD;  Location: [facility];  Service: Thoracic;  Laterality: Right;   VIDEO ASSISTED THORACOSCOPY Right 08/04/2016   Procedure: REDO VIDEO ASSISTED  THORACOSCOPY- EVACUATION OF HEMOTHORAX;  Surgeon: Melrose Nakayama, MD;  Location: [place] [facility];  Service: Thoracic;  Laterality: Right;   VIDEO BRONCHOSCOPY WITH ENDOBRONCHIAL NAVIGATION N/A 03/11/2014   Procedure: VIDEO BRONCHOSCOPY WITH ENDOBRONCHIAL NAVIGATION;  Surgeon: Collene Gobble, MD;  Location: [facility];  Service: Thoracic;  Laterality: N/A;    reports that he has quit smoking. His smoking use included cigarettes. He has a 15.00 pack-year smoking history. He has never used smokeless tobacco. He reports current alcohol use. He reports current drug use. Drug: Marijuana. family history includes Heart disease in his father and mother; Kidney disease in his sister. Allergies  Allergen Reactions   Pylera [Bis Subcit-Metronid-Tetracyc]     Itching    Current Outpatient Medications on File Prior to Visit  Medication Sig Dispense Refill   apixaban (ELIQUIS) 2.5 MG TABS tablet Take 1 tablet (2.5 mg total) by mouth 2 (two) times daily. 180 tablet 3   ezetimibe (ZETIA) 10 MG tablet TAKE 1 TABLET DAILY (ANNUAL APPOINTMENT DUE IN OCTOBER MUST SEE PROVIDER FOR FUTURE REFILLS) 90 tablet 0   hydrOXYzine (ATARAX/VISTARIL) 10 MG tablet Take 1 tablet (10 mg total) by mouth 3 (three) times daily as needed for itching. 60 tablet 1   Multiple Vitamins-Minerals (MULTIVITAMIN WITH MINERALS) tablet Take 1 tablet by mouth daily. Men 50 +     ondansetron (ZOFRAN) 4 MG tablet Take 1 tablet (4 mg total) by mouth every 6 (six) hours. 12 tablet 0   rosuvastatin (CRESTOR) 40 MG tablet TAKE 1 TABLET DAILY 90 tablet 3   tadalafil (CIALIS) 20 MG tablet Take 1 tablet (20 mg total) by mouth daily as needed for erectile dysfunction. 6 tablet 8   temazepam (RESTORIL) 15 MG capsule 1 tab by  mouth at  bedtime as needed 60 capsule 2   tiZANidine (ZANAFLEX) 2 MG tablet TAKE 1 TABLET BY MOUTH TWICE DAILY AS NEEDED FOR MUSCLE SPASM 60 tablet 2   albuterol (VENTOLIN HFA) 108 (90 Base) MCG/ACT inhaler Inhale 2 puffs into the lungs  every 6 (six) hours as needed for wheezing or shortness of breath. (Patient not taking: Reported on 03/07/2021) 8 g 5   chlorproMAZINE (THORAZINE) 10 MG tablet Take 1 tablet (10 mg total) by mouth 3 (three) times daily as needed for up to 10 doses. To help with hiccups (Patient not taking: Reported on 03/07/2021) 10 tablet 0   No current facility-administered medications on file prior to visit.        ROS:  All others reviewed and negative.  Objective        PE:  BP 108/60 (BP Location: Left Arm, Patient Position: Sitting, Cuff Size: Large)    Pulse 98    Ht 6\' 4"  (1.93 m)    Wt 174 lb (78.9 kg)    SpO2 98%    BMI 21.18 kg/m                 Constitutional: Pt appears in NAD               HENT: Head: NCAT.  Right Ear: External ear normal.                 Left Ear: External ear normal.                Eyes: . Pupils are equal, round, and reactive to light. Conjunctivae and EOM are normal               Nose: without d/c or deformity               Neck: Neck supple. Gross normal ROM               Cardiovascular: Normal rate and regular rhythm.                 Pulmonary/Chest: Effort normal and breath sounds without rales or wheezing.                Abd:  Soft, mild epigastric tender, ND, + BS, no organomegaly               Neurological: Pt is alert. At baseline orientation, motor grossly intact               Skin: Skin is warm. No rashes, no other new lesions, LE edema - none               Psychiatric: Pt behavior is normal without agitation   Micro: none  Cardiac tracings I have personally interpreted today:  none  Pertinent Radiological findings (summarize): none   Lab Results  Component Value Date   WBC 11.1 (H) 03/04/2021   HGB 14.7 03/04/2021   HCT 43.0 03/04/2021   PLT 176 03/04/2021   GLUCOSE 135 (H) 03/04/2021   CHOL 159 09/08/2020   TRIG 231.0 (H) 09/08/2020   HDL 74.20 09/08/2020   LDLDIRECT 65.0 09/08/2020   LDLCALC 123 (H) 08/26/2019   ALT 32 09/08/2020    AST 37 09/08/2020   NA 136 03/04/2021   K 4.0 03/04/2021   CL 103 03/04/2021   CREATININE 2.94 (H) 03/04/2021   BUN 43 (H) 03/04/2021   CO2 17 (L) 03/04/2021   TSH 1.41 09/08/2020   PSA 3.02 09/08/2020   INR 1.2 09/29/2019   HGBA1C 5.0 09/08/2020   MICROALBUR 1.5 08/25/2015   Assessment/Plan:  GILAD DUGGER is a 72 y.o. Black or African American [2] male with  has a past medical history of ABNORMAL ELECTROCARDIOGRAM (11/02/2008), ABSCESS, LUNG (10/23/2006), Anemia (05/26/2014), BACTERIAL PNEUMONIA (12/28/2009), BPH (benign prostatic hyperplasia) (11/22/2010), Cerebellar stroke ([place]) (05/26/2014), CHEST PAIN (12/24/2009), Coronary artery calcification seen on CT scan (06/01/2015), Cramp of limb (07/19/2007), DIVERTICULOSIS, COLON (03/07/2007), DVT (deep venous thrombosis) ([place]) (01/2014), Emphysema of lung ([place]), ERECTILE DYSFUNCTION (10/23/2006), FLANK PAIN, LEFT (02/25/2010), FREQUENCY, URINARY (12/24/2009), HEMORRHOIDS (10/23/2006), HYPERLIPIDEMIA (10/23/2006), HYPERTENSION (10/23/2006), PE (pulmonary embolism) (02/2014), PLANTAR FASCIITIS, LEFT (11/02/2008), Pneumonia (12/2013 X 2), PSA, INCREASED (11/20/2007), PULMONARY NODULE (05/14/2008), and Unspecified Peripheral Vascular Disease (10/23/2006).  CKD (chronic kidney disease), stage III Lab Results  Component Value Date   CREATININE 2.94 (H) 03/04/2021   Stable overall, cont to avoid nephrotoxins   Essential hypertension BP Readings from Last 3 Encounters:  03/07/21 108/60  03/04/21 (!) 156/89  09/08/20 132/78   Stable, pt to continue medical treatment amlodipine   Vitamin D deficiency Last vitamin D Lab Results  Component Value Date   VD25OH 56.67 09/08/2020   Stable, cont oral replacement   Gastritis and gastroduodenitis With mild worsening for carafate qid x 1 mo,  to f/u any worsening symptoms or concerns  GERD With worsening, for increased protonix 40 qd  Followup: Return in about 6 months (around 09/04/2021).  Cathlean Cower, MD  03/12/2021 9:20 PM [facility]

## 2021-03-10 ENCOUNTER — Other Ambulatory Visit: Payer: Self-pay | Admitting: Internal Medicine

## 2021-03-10 NOTE — Telephone Encounter (Signed)
Please refill as per office routine med refill policy (all routine meds to be refilled for 3 mo or monthly (per pt preference) up to one year from last visit, then month to month grace period for 3 mo, then further med refills will have to be denied) ? ?

## 2021-03-11 ENCOUNTER — Ambulatory Visit: Payer: Medicare Other | Admitting: Internal Medicine

## 2021-03-12 ENCOUNTER — Encounter: Payer: Self-pay | Admitting: Internal Medicine

## 2021-03-12 NOTE — Assessment & Plan Note (Signed)
With worsening, for increased protonix 40 qd

## 2021-03-12 NOTE — Assessment & Plan Note (Signed)
With mild worsening for carafate qid x 1 mo,  to f/u any worsening symptoms or concerns

## 2021-03-12 NOTE — Assessment & Plan Note (Signed)
Lab Results  Component Value Date   CREATININE 2.94 (H) 03/04/2021   Stable overall, cont to avoid nephrotoxins

## 2021-03-12 NOTE — Assessment & Plan Note (Signed)
BP Readings from Last 3 Encounters:  03/07/21 108/60  03/04/21 (!) 156/89  09/08/20 132/78   Stable, pt to continue medical treatment amlodipine

## 2021-03-12 NOTE — Assessment & Plan Note (Signed)
Last vitamin D Lab Results  Component Value Date   VD25OH 56.67 09/08/2020   Stable, cont oral replacement

## 2021-03-18 ENCOUNTER — Other Ambulatory Visit: Payer: Self-pay | Admitting: Internal Medicine

## 2021-04-15 ENCOUNTER — Other Ambulatory Visit: Payer: Self-pay | Admitting: Internal Medicine

## 2021-04-15 NOTE — Telephone Encounter (Signed)
Please refill as per office routine med refill policy (all routine meds to be refilled for 3 mo or monthly (per pt preference) up to one year from last visit, then month to month grace period for 3 mo, then further med refills will have to be denied) ? ?

## 2021-04-22 ENCOUNTER — Other Ambulatory Visit: Payer: Self-pay | Admitting: Internal Medicine

## 2021-04-22 NOTE — Telephone Encounter (Signed)
Please refill as per office routine med refill policy (all routine meds to be refilled for 3 mo or monthly (per pt preference) up to one year from last visit, then month to month grace period for 3 mo, then further med refills will have to be denied) ? ?

## 2021-05-05 ENCOUNTER — Ambulatory Visit: Payer: Medicare Other

## 2021-07-07 ENCOUNTER — Encounter: Payer: Self-pay | Admitting: Internal Medicine

## 2021-07-07 ENCOUNTER — Ambulatory Visit (INDEPENDENT_AMBULATORY_CARE_PROVIDER_SITE_OTHER): Payer: Medicare Other | Admitting: Internal Medicine

## 2021-07-07 VITALS — BP 122/68 | HR 63 | Resp 18 | Ht 76.0 in | Wt 184.0 lb

## 2021-07-07 DIAGNOSIS — R739 Hyperglycemia, unspecified: Secondary | ICD-10-CM

## 2021-07-07 DIAGNOSIS — R202 Paresthesia of skin: Secondary | ICD-10-CM | POA: Diagnosis not present

## 2021-07-07 DIAGNOSIS — E7800 Pure hypercholesterolemia, unspecified: Secondary | ICD-10-CM

## 2021-07-07 DIAGNOSIS — E559 Vitamin D deficiency, unspecified: Secondary | ICD-10-CM | POA: Diagnosis not present

## 2021-07-07 DIAGNOSIS — E538 Deficiency of other specified B group vitamins: Secondary | ICD-10-CM | POA: Diagnosis not present

## 2021-07-07 DIAGNOSIS — N320 Bladder-neck obstruction: Secondary | ICD-10-CM | POA: Diagnosis not present

## 2021-07-07 LAB — CBC WITH DIFFERENTIAL/PLATELET
Basophils Absolute: 0 10*3/uL (ref 0.0–0.1)
Basophils Relative: 0.8 % (ref 0.0–3.0)
Eosinophils Absolute: 0.2 10*3/uL (ref 0.0–0.7)
Eosinophils Relative: 3.5 % (ref 0.0–5.0)
HCT: 38.3 % — ABNORMAL LOW (ref 39.0–52.0)
Hemoglobin: 12.9 g/dL — ABNORMAL LOW (ref 13.0–17.0)
Lymphocytes Relative: 35.8 % (ref 12.0–46.0)
Lymphs Abs: 2.2 10*3/uL (ref 0.7–4.0)
MCHC: 33.6 g/dL (ref 30.0–36.0)
MCV: 93.4 fl (ref 78.0–100.0)
Monocytes Absolute: 0.9 10*3/uL (ref 0.1–1.0)
Monocytes Relative: 14.8 % — ABNORMAL HIGH (ref 3.0–12.0)
Neutro Abs: 2.8 10*3/uL (ref 1.4–7.7)
Neutrophils Relative %: 45.1 % (ref 43.0–77.0)
Platelets: 182 10*3/uL (ref 150.0–400.0)
RBC: 4.1 Mil/uL — ABNORMAL LOW (ref 4.22–5.81)
RDW: 14 % (ref 11.5–15.5)
WBC: 6.2 10*3/uL (ref 4.0–10.5)

## 2021-07-07 LAB — BASIC METABOLIC PANEL
BUN: 26 mg/dL — ABNORMAL HIGH (ref 6–23)
CO2: 24 mEq/L (ref 19–32)
Calcium: 9.1 mg/dL (ref 8.4–10.5)
Chloride: 109 mEq/L (ref 96–112)
Creatinine, Ser: 1.97 mg/dL — ABNORMAL HIGH (ref 0.40–1.50)
GFR: 34.19 mL/min — ABNORMAL LOW (ref >60.00)
Glucose, Bld: 87 mg/dL (ref 70–99)
Potassium: 4.1 mEq/L (ref 3.5–5.1)
Sodium: 138 mEq/L (ref 135–145)

## 2021-07-07 LAB — LIPID PANEL
Cholesterol: 170 mg/dL (ref 0–200)
HDL: 34.3 mg/dL — ABNORMAL LOW (ref >39.00)
NonHDL: 135.56
Total CHOL/HDL Ratio: 5
Triglycerides: 238 mg/dL — ABNORMAL HIGH (ref 0.0–149.0)
VLDL: 47.6 mg/dL — ABNORMAL HIGH (ref 0.0–40.0)

## 2021-07-07 LAB — PSA: PSA: 2.27 ng/mL (ref 0.10–4.00)

## 2021-07-07 LAB — LDL CHOLESTEROL, DIRECT: Direct LDL: 103 mg/dL

## 2021-07-07 LAB — HEPATIC FUNCTION PANEL
ALT: 10 U/L (ref 0–53)
AST: 13 U/L (ref 0–37)
Albumin: 4.2 g/dL (ref 3.5–5.2)
Alkaline Phosphatase: 45 U/L (ref 39–117)
Bilirubin, Direct: 0.1 mg/dL (ref 0.0–0.3)
Total Bilirubin: 0.5 mg/dL (ref 0.2–1.2)
Total Protein: 7.1 g/dL (ref 6.0–8.3)

## 2021-07-07 LAB — VITAMIN D 25 HYDROXY (VIT D DEFICIENCY, FRACTURES): VITD: 44.33 ng/mL (ref 30.00–100.00)

## 2021-07-07 LAB — TSH: TSH: 2.03 u[IU]/mL (ref 0.35–5.50)

## 2021-07-07 LAB — VITAMIN B12: Vitamin B-12: 338 pg/mL (ref 211–911)

## 2021-07-07 NOTE — Patient Instructions (Signed)
Please wear a left wrist splint at night only to help the tingling that you have.  Please see Dr Amedeo Plenty at [facility] if not better or getting worse  Please continue all other medications as before, and refills have been done if requested.  Please have the pharmacy call with any other refills you may need.  Please continue your efforts at being more active, low cholesterol diet, and weight control.  You are otherwise up to date with prevention measures today.  Please keep your appointments with your specialists as you may have planned  Please go to the LAB at the blood drawing area for the tests to be done  You will be contacted by phone if any changes need to be made immediately.  Otherwise, you will receive a letter about your results with an explanation, but please check with MyChart first.  Please remember to sign up for MyChart if you have not done so, as this will be important to you in the future with finding out test results, communicating by private email, and scheduling acute appointments online when needed.  Please make an Appointment to return in 6 months, or sooner if needed

## 2021-07-07 NOTE — Progress Notes (Signed)
Patient ID: Charles [name], male   DOB: 07/21/1950, 71 y.o.   MRN: [id number]        Chief Complaint: follow up tingling left 4th and 5th fingers x 1 wk       HPI:  Charles [name] is a 71 y.o. male here with c/o above, without signfiicant pain but numbness on occasion extends to the left elbow as well, without significant weakness or trauma. Worse with driving, better to rest.    Pt denies chest pain, increased sob or doe, wheezing, orthopnea, PND, increased LE swelling, palpitations, dizziness or syncope.   Pt denies polydipsia, polyuria, or other new focal neuro s/s.    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   CHEST PAIN 12/24/2009   Coronary artery calcification seen on CT scan 06/01/2015   Cramp of limb 07/19/2007   DIVERTICULOSIS, COLON 03/07/2007   DVT (deep venous thrombosis) ([place]) 01/2014   LLE   Emphysema of lung ([place])    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/23/2006   FLANK PAIN, LEFT 02/25/2010   FREQUENCY, URINARY 12/24/2009   HEMORRHOIDS 10/23/2006   HYPERLIPIDEMIA 10/23/2006   HYPERTENSION 10/23/2006   PE (pulmonary embolism) 02/2014   PLANTAR FASCIITIS, LEFT 11/02/2008   Pneumonia 12/2013 X 2   PSA, INCREASED 11/20/2007   PULMONARY NODULE 05/14/2008   Unspecified Peripheral Vascular Disease 10/23/2006   Past Surgical History:  Procedure Laterality Date   BIOPSY  09/29/2019   Procedure: BIOPSY;  Surgeon: Jerene Bears, MD;  [facility];  Service: Endoscopy;;   BIOPSY  12/25/2019    Procedure: BIOPSY;  Surgeon: Milus Banister, MD;  [facility];  Service: Endoscopy;;   ESOPHAGOGASTRODUODENOSCOPY (EGD) WITH PROPOFOL N/A 06/15/2017   Procedure: ESOPHAGOGASTRODUODENOSCOPY (EGD) WITH PROPOFOL;  Surgeon: Jerene Bears, MD;  [facility];  Service: Gastroenterology;  Laterality: N/A;   ESOPHAGOGASTRODUODENOSCOPY (EGD) WITH PROPOFOL N/A 09/29/2019   Procedure: ESOPHAGOGASTRODUODENOSCOPY (EGD) WITH PROPOFOL;  Surgeon: Jerene Bears, MD;  [facility];  Service: Endoscopy;  Laterality: N/A;   ESOPHAGOGASTRODUODENOSCOPY (EGD) WITH PROPOFOL N/A 12/25/2019   Procedure: ESOPHAGOGASTRODUODENOSCOPY (EGD) WITH PROPOFOL;  Surgeon: Milus Banister, MD;  [facility];  Service: Endoscopy;  Laterality: N/A;   EUS N/A 12/25/2019   Procedure: UPPER ENDOSCOPIC ULTRASOUND (EUS) RADIAL;  Surgeon: Milus Banister, MD;  [facility];  Service: Endoscopy;  Laterality: N/A;   HEMORRHOID SURGERY  ?1990's   INGUINAL HERNIA REPAIR Bilateral ?2000's   IVC FILTER INSERTION N/A 10/03/2016   Procedure: IVC Filter Retrieveal;  Surgeon: Serafina Mitchell, MD;  Location: [facility];  Service: Cardiovascular;  Laterality: N/A;   MYRINGOTOMY WITH TUBE PLACEMENT Right 06/26/2018   SHOULDER ARTHROSCOPY W/ ROTATOR CUFF REPAIR Right 11/2013   STAPLING OF BLEBS Right 07/31/2016   Procedure: BLEBECTOMY;  Surgeon: Melrose Nakayama, MD;  Location: [facility];  Service: Thoracic;  Laterality: Right;   VENA  CAVA FILTER PLACEMENT Right 08/04/2016   Procedure: INSERTION VENA-CAVA FILTER;  Surgeon: Melrose Nakayama, MD;  Location: [facility];  Service: Thoracic;  Laterality: Right;   VIDEO ASSISTED THORACOSCOPY Right 07/31/2016   Procedure: VIDEO ASSISTED THORACOSCOPY;  Surgeon: Melrose Nakayama, MD;  Location: [facility];  Service: Thoracic;  Laterality: Right;   VIDEO ASSISTED THORACOSCOPY Right 08/04/2016   Procedure: REDO VIDEO ASSISTED THORACOSCOPY- EVACUATION OF  HEMOTHORAX;  Surgeon: Melrose Nakayama, MD;  Location: [facility];  Service: Thoracic;  Laterality: Right;   VIDEO BRONCHOSCOPY WITH ENDOBRONCHIAL NAVIGATION N/A 03/11/2014   Procedure: VIDEO BRONCHOSCOPY WITH ENDOBRONCHIAL NAVIGATION;  Surgeon: Collene Gobble, MD;  Location: [facility];  Service: Thoracic;  Laterality: N/A;    reports that he has quit smoking. His smoking use included cigarettes. He has a 15.00 pack-year smoking history. He has never used smokeless tobacco. He reports current alcohol use. He reports current drug use. Drug: Marijuana. family history includes Heart disease in his father and mother; Kidney disease in his sister. Allergies  Allergen Reactions   Pylera [Bis Subcit-Metronid-Tetracyc]     Itching    Current Outpatient Medications on File Prior to Visit  Medication Sig Dispense Refill   albuterol (VENTOLIN HFA) 108 (90 Base) MCG/ACT inhaler Inhale 2 puffs into the lungs every 6 (six) hours as needed for wheezing or shortness of breath. 8 g 5   amLODipine (NORVASC) 10 MG tablet TAKE 1 TABLET DAILY 90 tablet 3   chlorproMAZINE (THORAZINE) 10 MG tablet Take 1 tablet (10 mg total) by mouth 3 (three) times daily as needed for up to 10 doses. To help with hiccups 10 tablet 0   ELIQUIS 2.5 MG TABS tablet TAKE 1 TABLET TWICE A DAY 180 tablet 3   ezetimibe (ZETIA) 10 MG tablet TAKE 1 TABLET DAILY (ANNUAL APPOINTMENT DUE IN OCTOBER MUST SEE PROVIDER FOR FUTURE REFILLS) 90 tablet 1   hydrOXYzine (ATARAX/VISTARIL) 10 MG tablet Take 1 tablet (10 mg total) by mouth 3 (three) times daily as needed for itching. 60 tablet 1   Multiple Vitamins-Minerals (MULTIVITAMIN WITH MINERALS) tablet Take 1 tablet by mouth daily. Men 50 +     ondansetron (ZOFRAN) 4 MG tablet Take 1 tablet (4 mg total) by mouth every 6 (six) hours. 12 tablet 0   pantoprazole (PROTONIX) 40 MG tablet Take 1 tablet (40 mg total) by mouth daily. 90 tablet 3   rosuvastatin (CRESTOR) 40 MG tablet TAKE 1 TABLET DAILY 90 tablet  1   sucralfate (CARAFATE) 1 g tablet Take 1 tablet (1 g total) by mouth 4 (four) times daily. 120 tablet 0   tadalafil (CIALIS) 20 MG tablet Take 1 tablet (20 mg total) by mouth daily as needed for erectile dysfunction. 6 tablet 8   temazepam (RESTORIL) 15 MG capsule 1 tab by  mouth at  bedtime as needed 60 capsule 2   tiZANidine (ZANAFLEX) 2 MG tablet TAKE 1 TABLET BY MOUTH TWICE DAILY AS NEEDED FOR MUSCLE SPASM 60 tablet 2   No current facility-administered medications on file prior to visit.        ROS:  All others reviewed and negative.  Objective        PE:  BP 122/68   Pulse 63   Resp 18   Ht '6\' 4"'$  (1.93 m)   Wt 184 lb (83.5 kg)   SpO2 98%   BMI 22.40 kg/m  Constitutional: Pt appears in NAD               HENT: Head: NCAT.                Right Ear: External ear normal.                 Left Ear: External ear normal.                Eyes: . Pupils are equal, round, and reactive to light. Conjunctivae and EOM are normal               Nose: without d/c or deformity               Neck: Neck supple. Gross normal ROM               Cardiovascular: Normal rate and regular rhythm.                 Pulmonary/Chest: Effort normal and breath sounds without rales or wheezing.                Abd:  Soft, NT, ND, + BS, no organomegaly               Neurological: Pt is alert. At baseline orientation, motor grossly intact               Skin: Skin is warm. No rashes, no other new lesions, LE edema - none               Psychiatric: Pt behavior is normal without agitation   Micro: none  Cardiac tracings I have personally interpreted today:  none  Pertinent Radiological findings (summarize): none   Lab Results  Component Value Date   WBC 6.2 07/07/2021   HGB 12.9 (L) 07/07/2021   HCT 38.3 (L) 07/07/2021   PLT 182.0 07/07/2021   GLUCOSE 87 07/07/2021   CHOL 170 07/07/2021   TRIG 238.0 (H) 07/07/2021   HDL 34.30 (L) 07/07/2021   LDLDIRECT 103.0 07/07/2021   LDLCALC 123  (H) 08/26/2019   ALT 10 07/07/2021   AST 13 07/07/2021   NA 138 07/07/2021   K 4.1 07/07/2021   CL 109 07/07/2021   CREATININE 1.97 (H) 07/07/2021   BUN 26 (H) 07/07/2021   CO2 24 07/07/2021   TSH 2.03 07/07/2021   PSA 2.27 07/07/2021   INR 1.2 09/29/2019   HGBA1C 5.0 09/08/2020   MICROALBUR 1.5 08/25/2015   Assessment/Plan:  NIRVAAN FRETT is a 71 y.o. Black or African American [2] male with  has a past medical history of ABNORMAL ELECTROCARDIOGRAM (11/02/2008), ABSCESS, LUNG (10/23/2006), Anemia (05/26/2014), BACTERIAL PNEUMONIA (12/28/2009), BPH (benign prostatic hyperplasia) (11/22/2010), Cerebellar stroke ([place]) (05/26/2014), CHEST PAIN (12/24/2009), Coronary artery calcification seen on CT scan (06/01/2015), Cramp of limb (07/19/2007), DIVERTICULOSIS, COLON (03/07/2007), DVT (deep venous thrombosis) ([place]) (01/2014), Emphysema of lung ([place]), ERECTILE DYSFUNCTION (10/23/2006), FLANK PAIN, LEFT (02/25/2010), FREQUENCY, URINARY (12/24/2009), HEMORRHOIDS (10/23/2006), HYPERLIPIDEMIA (10/23/2006), HYPERTENSION (10/23/2006), PE (pulmonary embolism) (02/2014), PLANTAR FASCIITIS, LEFT (11/02/2008), Pneumonia (12/2013 X 2), PSA, INCREASED (11/20/2007), PULMONARY NODULE (05/14/2008), and Unspecified Peripheral Vascular Disease (10/23/2006).  Hyperlipidemia Lab Results  Component Value Date   LDLCALC 123 (H) 08/26/2019   Uncontrolled, pt to continue current crestor and zetia and encouraged compliance, and lower chol diet   Vitamin D deficiency Last vitamin D Lab Results  Component Value Date   VD25OH 44.33 07/07/2021   Stable, cont oral replacement   Hyperglycemia Lab Results  Component Value Date   HGBA1C 5.0 09/08/2020   Stable, pt to continue current medical treatment  - diet   Arm paresthesia, left Exam c/w CTS vs ulnar neuritis, for trial left wrist splint at bedtime, and to f/u with Dr Elon Jester hand surgury if persists or worsens, also check [place] anad B12 with labs  Followup: Return in about  6 months (around 01/07/2022).  Cathlean Cower, MD 07/10/2021 1:49 PM [facility]

## 2021-07-08 ENCOUNTER — Other Ambulatory Visit: Payer: Self-pay | Admitting: *Deleted

## 2021-07-08 LAB — URINALYSIS, ROUTINE W REFLEX MICROSCOPIC
Ketones, ur: NEGATIVE
Leukocytes,Ua: NEGATIVE
Nitrite: NEGATIVE
Specific Gravity, Urine: 1.025 (ref 1.000–1.030)
Total Protein, Urine: NEGATIVE
Urine Glucose: NEGATIVE
Urobilinogen, UA: 0.2 (ref 0.0–1.0)
pH: 6.5 (ref 5.0–8.0)

## 2021-07-10 ENCOUNTER — Encounter: Payer: Self-pay | Admitting: Internal Medicine

## 2021-07-10 DIAGNOSIS — R202 Paresthesia of skin: Secondary | ICD-10-CM | POA: Insufficient documentation

## 2021-07-10 NOTE — Assessment & Plan Note (Addendum)
Exam c/w CTS vs ulnar neuritis, for trial left wrist splint at bedtime, and to f/u with Dr Elon Jester hand surgury if persists or worsens, also check [place] anad B12 with labs

## 2021-07-10 NOTE — Assessment & Plan Note (Signed)
Lab Results  Component Value Date   LDLCALC 123 (H) 08/26/2019   Uncontrolled, pt to continue current crestor and zetia and encouraged compliance, and lower chol diet

## 2021-07-10 NOTE — Assessment & Plan Note (Signed)
Lab Results  Component Value Date   HGBA1C 5.0 09/08/2020   Stable, pt to continue current medical treatment  - diet

## 2021-07-10 NOTE — Assessment & Plan Note (Signed)
Last vitamin D Lab Results  Component Value Date   VD25OH 44.33 07/07/2021   Stable, cont oral replacement  

## 2021-08-01 ENCOUNTER — Encounter: Payer: Self-pay | Admitting: Gastroenterology

## 2021-08-19 ENCOUNTER — Other Ambulatory Visit: Payer: Self-pay | Admitting: Internal Medicine

## 2021-09-06 ENCOUNTER — Ambulatory Visit: Payer: TRICARE For Life (TFL) | Admitting: Internal Medicine

## 2021-09-21 ENCOUNTER — Ambulatory Visit (INDEPENDENT_AMBULATORY_CARE_PROVIDER_SITE_OTHER): Payer: Medicare Other | Admitting: Internal Medicine

## 2021-09-21 ENCOUNTER — Encounter: Payer: Self-pay | Admitting: Internal Medicine

## 2021-09-21 VITALS — BP 118/58 | HR 64 | Temp 97.9°F | Ht 76.0 in | Wt 184.0 lb

## 2021-09-21 DIAGNOSIS — E7800 Pure hypercholesterolemia, unspecified: Secondary | ICD-10-CM

## 2021-09-21 DIAGNOSIS — N1831 Chronic kidney disease, stage 3a: Secondary | ICD-10-CM

## 2021-09-21 DIAGNOSIS — R739 Hyperglycemia, unspecified: Secondary | ICD-10-CM

## 2021-09-21 DIAGNOSIS — R351 Nocturia: Secondary | ICD-10-CM | POA: Diagnosis not present

## 2021-09-21 DIAGNOSIS — N401 Enlarged prostate with lower urinary tract symptoms: Secondary | ICD-10-CM | POA: Diagnosis not present

## 2021-09-21 DIAGNOSIS — E559 Vitamin D deficiency, unspecified: Secondary | ICD-10-CM

## 2021-09-21 DIAGNOSIS — I10 Essential (primary) hypertension: Secondary | ICD-10-CM

## 2021-09-21 MED ORDER — TAMSULOSIN HCL 0.4 MG PO CAPS: 0.4000 mg | ORAL_CAPSULE | Freq: Every day | ORAL | 3 refills | Status: DC

## 2021-09-21 NOTE — Assessment & Plan Note (Signed)
Lab Results  Component Value Date   LDLCALC 123 (H) 08/26/2019   uncontrolled, pt to restart current statin crestor 40 mg and zetia 10 mg

## 2021-09-21 NOTE — Assessment & Plan Note (Signed)
Last vitamin D Lab Results  Component Value Date   VD25OH 44.33 07/07/2021   Stable, cont oral replacement

## 2021-09-21 NOTE — Patient Instructions (Addendum)
Please have your Shingrix (shingles) shots done at your local pharmacy - maybe the tricare pays for this  Please take all new medication as prescribed  - the flomax at bedtime ([facility]  Please continue all other medications as before, and refills have been done if requested.  Please have the pharmacy call with any other refills you may need.  Please continue your efforts at being more active, low cholesterol diet, and weight control.  You are otherwise up to date with prevention measures today.  Please keep your appointments with your specialists as you may have planned  Please make an Appointment to return in 6 months, or sooner if needed

## 2021-09-21 NOTE — Progress Notes (Signed)
Patient ID: Charles [name], male   DOB: 05/23/1950, 71 y.o.   MRN: [id number]         Chief Complaint:: yearly exam       HPI:  Charles [name] is a 71 y.o. male here overall doing ok, has ? Mild worsening left droopy eyelid but states no change in vision, declines plastic surgury referral.  Pt denies chest pain, increased sob or doe, wheezing, orthopnea, PND, increased LE swelling, palpitations, dizziness or syncope.   Pt denies polydipsia, polyuria, or new focal neuro s/s.    Pt denies fever, wt loss, night sweats, loss of appetite, or other constitutional symptoms  Sees renal yearly as overall ckd stable. Wt up several 10 lbs with less rigoruous diet but plans to do better.   Does have recent worsening slowing urinary stream and nocturia about 4 times per night, hoping to do better.  Denies urinary symptoms such as dysuria, urgency, flank pain, hematuria or n/v, fever, chills.  Has been out of statin and zetia recently. Wt Readings from Last 3 Encounters:  09/21/21 184 lb (83.5 kg)  07/07/21 184 lb (83.5 kg)  03/07/21 174 lb (78.9 kg)   BP Readings from Last 3 Encounters:  09/21/21 (!) 118/58  07/07/21 122/68  03/07/21 108/60   Immunization History  Administered Date(s) Administered   Moderna Sars-Covid-2 Vaccination 04/14/2019, 05/13/2019, 02/09/2020   PNEUMOCOCCAL CONJUGATE-20 09/08/2020   Td 02/20/1998, 11/02/2008   There are no preventive care reminders to display for this patient.     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   CHEST PAIN 12/24/2009   Coronary artery calcification seen on CT scan 06/01/2015   Cramp of limb 07/19/2007   DIVERTICULOSIS, COLON 03/07/2007   DVT (deep venous thrombosis) ([place]) 01/2014   LLE   Emphysema of lung ([place])    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/23/2006   FLANK  PAIN, LEFT 02/25/2010   FREQUENCY, URINARY 12/24/2009   HEMORRHOIDS 10/23/2006   HYPERLIPIDEMIA 10/23/2006   HYPERTENSION 10/23/2006   PE (pulmonary embolism) 02/2014   PLANTAR FASCIITIS, LEFT 11/02/2008   Pneumonia 12/2013 X 2   PSA, INCREASED 11/20/2007   PULMONARY NODULE 05/14/2008   Unspecified Peripheral Vascular Disease 10/23/2006   Past Surgical History:  Procedure Laterality Date   BIOPSY  09/29/2019   Procedure: BIOPSY;  Surgeon: Jerene Bears, MD;  [facility];  Service: Endoscopy;;   BIOPSY  12/25/2019   Procedure: BIOPSY;  Surgeon: Milus Banister, MD;  [facility];  Service: Endoscopy;;   ESOPHAGOGASTRODUODENOSCOPY (EGD) WITH PROPOFOL N/A 06/15/2017   Procedure: ESOPHAGOGASTRODUODENOSCOPY (EGD) WITH PROPOFOL;  Surgeon: Jerene Bears, MD;  [facility];  Service: Gastroenterology;  Laterality: N/A;   ESOPHAGOGASTRODUODENOSCOPY (EGD) WITH PROPOFOL N/A 09/29/2019   Procedure: ESOPHAGOGASTRODUODENOSCOPY (EGD) WITH PROPOFOL;  Surgeon: Jerene Bears, MD;  [facility];  Service: Endoscopy;  Laterality: N/A;   ESOPHAGOGASTRODUODENOSCOPY (EGD) WITH PROPOFOL N/A 12/25/2019   Procedure: ESOPHAGOGASTRODUODENOSCOPY (EGD) WITH PROPOFOL;  Surgeon: Milus Banister, MD;  [facility];  Service: Endoscopy;  Laterality: N/A;   EUS N/A 12/25/2019   Procedure: UPPER ENDOSCOPIC ULTRASOUND (EUS) RADIAL;  Surgeon: Milus Banister, MD;  [facility];  Service: Endoscopy;  Laterality: N/A;   HEMORRHOID SURGERY  ?1990's   INGUINAL HERNIA REPAIR Bilateral ?2000's   IVC  FILTER INSERTION N/A 10/03/2016   Procedure: IVC Filter Retrieveal;  Surgeon: Serafina Mitchell, MD;  Location: [facility];  Service: Cardiovascular;  Laterality: N/A;   MYRINGOTOMY WITH TUBE PLACEMENT Right 06/26/2018   SHOULDER ARTHROSCOPY W/ ROTATOR CUFF REPAIR Right 11/2013   STAPLING OF BLEBS Right 07/31/2016   Procedure: BLEBECTOMY;  Surgeon: Melrose Nakayama, MD;  Location:  [facility];  Service: Thoracic;  Laterality: Right;   VENA CAVA FILTER PLACEMENT Right 08/04/2016   Procedure: INSERTION VENA-CAVA FILTER;  Surgeon: Melrose Nakayama, MD;  Location: [facility];  Service: Thoracic;  Laterality: Right;   VIDEO ASSISTED THORACOSCOPY Right 07/31/2016   Procedure: VIDEO ASSISTED THORACOSCOPY;  Surgeon: Melrose Nakayama, MD;  Location: [facility];  Service: Thoracic;  Laterality: Right;   VIDEO ASSISTED THORACOSCOPY Right 08/04/2016   Procedure: REDO VIDEO ASSISTED THORACOSCOPY- EVACUATION OF HEMOTHORAX;  Surgeon: Melrose Nakayama, MD;  Location: [facility];  Service: Thoracic;  Laterality: Right;   VIDEO BRONCHOSCOPY WITH ENDOBRONCHIAL NAVIGATION N/A 03/11/2014   Procedure: VIDEO BRONCHOSCOPY WITH ENDOBRONCHIAL NAVIGATION;  Surgeon: Collene Gobble, MD;  Location: [facility];  Service: Thoracic;  Laterality: N/A;    reports that he has quit smoking. His smoking use included cigarettes. He has a 15.00 pack-year smoking history. He has never used smokeless tobacco. He reports current alcohol use. He reports current drug use. Drug: Marijuana. family history includes Heart disease in his father and mother; Kidney disease in his sister. Allergies  Allergen Reactions   Pylera [Bis Subcit-Metronid-Tetracyc]     Itching    Current Outpatient Medications on File Prior to Visit  Medication Sig Dispense Refill   albuterol (VENTOLIN HFA) 108 (90 Base) MCG/ACT inhaler Inhale 2 puffs into the lungs every 6 (six) hours as needed for wheezing or shortness of breath. 8 g 5   amLODipine (NORVASC) 10 MG tablet TAKE 1 TABLET DAILY 90 tablet 3   chlorproMAZINE (THORAZINE) 10 MG tablet Take 1 tablet (10 mg total) by mouth 3 (three) times daily as needed for up to 10 doses. To help with hiccups 10 tablet 0   ELIQUIS 2.5 MG TABS tablet TAKE 1 TABLET TWICE A DAY 180 tablet 3   ezetimibe (ZETIA) 10 MG tablet TAKE 1 TABLET DAILY (ANNUAL APPOINTMENT DUE IN OCTOBER MUST SEE PROVIDER FOR FUTURE REFILLS) 90  tablet 1   hydrOXYzine (ATARAX/VISTARIL) 10 MG tablet Take 1 tablet (10 mg total) by mouth 3 (three) times daily as needed for itching. 60 tablet 1   Multiple Vitamins-Minerals (MULTIVITAMIN WITH MINERALS) tablet Take 1 tablet by mouth daily. Men 50 +     ondansetron (ZOFRAN) 4 MG tablet Take 1 tablet (4 mg total) by mouth every 6 (six) hours. 12 tablet 0   pantoprazole (PROTONIX) 40 MG tablet Take 1 tablet (40 mg total) by mouth daily. 90 tablet 3   rosuvastatin (CRESTOR) 40 MG tablet TAKE 1 TABLET DAILY 90 tablet 1   sucralfate (CARAFATE) 1 g tablet Take 1 tablet (1 g total) by mouth 4 (four) times daily. 120 tablet 0   tadalafil (CIALIS) 20 MG tablet Take 1 tablet (20 mg total) by mouth daily as needed for erectile dysfunction. 6 tablet 8   temazepam (RESTORIL) 15 MG capsule TAKE 1 CAPSULE BY MOUTH AT BEDTIME AS NEEDED 60 capsule 2   tiZANidine (ZANAFLEX) 2 MG tablet TAKE 1 TABLET BY MOUTH TWICE DAILY AS NEEDED FOR MUSCLE SPASM 60 tablet 2   No current facility-administered medications on file prior to  visit.        ROS:  All others reviewed and negative.  Objective        PE:  BP (!) 118/58 (BP Location: Right Arm, Patient Position: Sitting, Cuff Size: Large)   Pulse 64   Temp 97.9 F (36.6 C) (Oral)   Ht '6\' 4"'$  (1.93 m)   Wt 184 lb (83.5 kg)   SpO2 96%   BMI 22.40 kg/m                 Constitutional: Pt appears in NAD               HENT: Head: NCAT.                Right Ear: External ear normal.                 Left Ear: External ear normal.                Eyes: . Pupils are equal, round, and reactive to light. Conjunctivae and EOM are normal               Nose: without d/c or deformity               Neck: Neck supple. Gross normal ROM               Cardiovascular: Normal rate and regular rhythm.                 Pulmonary/Chest: Effort normal and breath sounds without rales or wheezing.                Abd:  Soft, NT, ND, + BS, no organomegaly               Neurological: Pt is  alert. At baseline orientation, motor grossly intact               Skin: Skin is warm. No rashes, no other new lesions, LE edema - none               Psychiatric: Pt behavior is normal without agitation   Micro: none  Cardiac tracings I have personally interpreted today:  none  Pertinent Radiological findings (summarize): none   Lab Results  Component Value Date   WBC 6.2 07/07/2021   HGB 12.9 (L) 07/07/2021   HCT 38.3 (L) 07/07/2021   PLT 182.0 07/07/2021   GLUCOSE 87 07/07/2021   CHOL 170 07/07/2021   TRIG 238.0 (H) 07/07/2021   HDL 34.30 (L) 07/07/2021   LDLDIRECT 103.0 07/07/2021   LDLCALC 123 (H) 08/26/2019   ALT 10 07/07/2021   AST 13 07/07/2021   NA 138 07/07/2021   K 4.1 07/07/2021   CL 109 07/07/2021   CREATININE 1.97 (H) 07/07/2021   BUN 26 (H) 07/07/2021   CO2 24 07/07/2021   TSH 2.03 07/07/2021   PSA 2.27 07/07/2021   INR 1.2 09/29/2019   HGBA1C 5.0 09/08/2020   MICROALBUR 1.5 08/25/2015   Assessment/Plan:  Charles [name] is a 71 y.o. 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), CHEST PAIN (12/24/2009), Coronary artery calcification seen on CT scan (06/01/2015), Cramp of limb (07/19/2007), DIVERTICULOSIS, COLON (03/07/2007), DVT (deep venous thrombosis) ([place]) (01/2014), Emphysema of lung ([place]), ERECTILE DYSFUNCTION (10/23/2006), FLANK PAIN, LEFT (02/25/2010), FREQUENCY, URINARY (12/24/2009), HEMORRHOIDS (  10/23/2006), HYPERLIPIDEMIA (10/23/2006), HYPERTENSION (10/23/2006), PE (pulmonary embolism) (02/2014), PLANTAR FASCIITIS, LEFT (11/02/2008), Pneumonia (12/2013 X 2), PSA, INCREASED (11/20/2007), PULMONARY NODULE (05/14/2008), and Unspecified Peripheral Vascular Disease (10/23/2006).  Vitamin D deficiency Last vitamin D Lab Results  Component Value Date    VD25OH 44.33 07/07/2021   Stable, cont oral replacement   Hyperlipidemia Lab Results  Component Value Date   LDLCALC 123 (H) 08/26/2019   uncontrolled, pt to restart current statin crestor 40 mg and zetia 10 mg   BPH associated with nocturia Uncontrolled worsening, for start flomax 1 qhs  Essential hypertension BP Readings from Last 3 Encounters:  09/21/21 (!) 118/58  07/07/21 122/68  03/07/21 108/60   Stable, pt to continue medical treatment norvasc 10 qd   Hyperglycemia Lab Results  Component Value Date   HGBA1C 5.0 09/08/2020   Stable, pt to continue current medical treatment  - diet, wt control   CKD (chronic kidney disease), stage III Lab Results  Component Value Date   CREATININE 1.97 (H) 07/07/2021   Stable overall, cont to avoid nephrotoxins  Followup: Return in about 6 months (around 03/24/2022).  Cathlean Cower, MD 09/24/2021 9:37 PM [facility]

## 2021-09-24 ENCOUNTER — Encounter: Payer: Self-pay | Admitting: Internal Medicine

## 2021-09-24 DIAGNOSIS — N401 Enlarged prostate with lower urinary tract symptoms: Secondary | ICD-10-CM | POA: Insufficient documentation

## 2021-09-24 NOTE — Assessment & Plan Note (Signed)
Uncontrolled worsening, for start flomax 1 qhs

## 2021-09-24 NOTE — Assessment & Plan Note (Signed)
Lab Results  Component Value Date   HGBA1C 5.0 09/08/2020   Stable, pt to continue current medical treatment  - diet, wt control

## 2021-09-24 NOTE — Assessment & Plan Note (Signed)
BP Readings from Last 3 Encounters:  09/21/21 (!) 118/58  07/07/21 122/68  03/07/21 108/60   Stable, pt to continue medical treatment norvasc 10 qd

## 2021-09-24 NOTE — Assessment & Plan Note (Signed)
Lab Results  Component Value Date   CREATININE 1.97 (H) 07/07/2021   Stable overall, cont to avoid nephrotoxins

## 2021-10-17 ENCOUNTER — Other Ambulatory Visit: Payer: Self-pay | Admitting: Internal Medicine

## 2021-10-17 NOTE — Telephone Encounter (Signed)
Please refill as per office routine med refill policy (all routine meds to be refilled for 3 mo or monthly (per pt preference) up to one year from last visit, then month to month grace period for 3 mo, then further med refills will have to be denied) ? ?

## 2021-10-21 ENCOUNTER — Other Ambulatory Visit: Payer: Self-pay | Admitting: Internal Medicine

## 2021-10-21 NOTE — Telephone Encounter (Signed)
Please refill as per office routine med refill policy (all routine meds to be refilled for 3 mo or monthly (per pt preference) up to one year from last visit, then month to month grace period for 3 mo, then further med refills will have to be denied) ? ?

## 2021-11-11 ENCOUNTER — Other Ambulatory Visit: Payer: Self-pay | Admitting: Internal Medicine

## 2021-11-11 NOTE — Telephone Encounter (Signed)
Please refill as per office routine med refill policy (all routine meds to be refilled for 3 mo or monthly (per pt preference) up to one year from last visit, then month to month grace period for 3 mo, then further med refills will have to be denied) ? ?

## 2021-11-25 ENCOUNTER — Ambulatory Visit (INDEPENDENT_AMBULATORY_CARE_PROVIDER_SITE_OTHER): Payer: Medicare Other

## 2021-11-25 VITALS — Ht 76.0 in | Wt 180.0 lb

## 2021-11-25 DIAGNOSIS — Z0000 Encounter for general adult medical examination without abnormal findings: Secondary | ICD-10-CM | POA: Diagnosis not present

## 2021-11-25 DIAGNOSIS — Z1211 Encounter for screening for malignant neoplasm of colon: Secondary | ICD-10-CM | POA: Diagnosis not present

## 2021-11-25 NOTE — Patient Instructions (Signed)
Mr. Charles [name] , Thank you for taking time to come for your Medicare Wellness Visit. I appreciate your ongoing commitment to your health goals. Please review the following plan we discussed and let me know if I can assist you in the future.   These are the goals we discussed:  Goals      Exercise 3x per week (30 min per time)        This is a list of the screening recommended for you and due dates:  Health Maintenance  Topic Date Due   Zoster (Shingles) Vaccine (1 of 2) Never done   Colon Cancer Screening  10/20/2021   Tetanus Vaccine  03/07/2022*   Flu Shot  05/21/2022*   Pneumonia Vaccine  Completed   Hepatitis C Screening: USPSTF Recommendation to screen - Ages 18-79 yo.  Completed   HPV Vaccine  Aged Out   COVID-19 Vaccine  Discontinued  *Topic was postponed. The date shown is not the original due date.    Advanced directives: Please bring a copy of your health care power of attorney and living will to the office to be added to your chart at your convenience.   Conditions/risks identified: Aim for 30 minutes of exercise or brisk walking, 6-8 glasses of water, and 5 servings of fruits and vegetables each day.   Next appointment: Follow up in one year for your annual wellness visit.   Preventive Care 3 Years and Older, Male  Preventive care refers to lifestyle choices and visits with your health care provider that can promote health and wellness. What does preventive care include? A yearly physical exam. This is also called an annual well check. Dental exams once or twice a year. Routine eye exams. Ask your health care provider how often you should have your eyes checked. Personal lifestyle choices, including: Daily care of your teeth and gums. Regular physical activity. Eating a healthy diet. Avoiding tobacco and drug use. Limiting alcohol use. Practicing safe sex. Taking low doses of aspirin every day. Taking vitamin and mineral supplements as recommended by your health  care provider. What happens during an annual well check? The services and screenings done by your health care provider during your annual well check will depend on your age, overall health, lifestyle risk factors, and family history of disease. Counseling  Your health care provider may ask you questions about your: Alcohol use. Tobacco use. Drug use. Emotional well-being. Home and relationship well-being. Sexual activity. Eating habits. History of falls. Memory and ability to understand (cognition). Work and work Statistician. Screening  You may have the following tests or measurements: Height, weight, and BMI. Blood pressure. Lipid and cholesterol levels. These may be checked every 5 years, or more frequently if you are over 33 years old. Skin check. Lung cancer screening. You may have this screening every year starting at age 58 if you have a 30-pack-year history of smoking and currently smoke or have quit within the past 15 years. Fecal occult blood test (FOBT) of the stool. You may have this test every year starting at age 41. Flexible sigmoidoscopy or colonoscopy. You may have a sigmoidoscopy every 5 years or a colonoscopy every 10 years starting at age 84. Prostate cancer screening. Recommendations will vary depending on your family history and other risks. Hepatitis C blood test. Hepatitis B blood test. Sexually transmitted disease (STD) testing. Diabetes screening. This is done by checking your blood sugar (glucose) after you have not eaten for a while (fasting). You may have this  done every 1-3 years. Abdominal aortic aneurysm (AAA) screening. You may need this if you are a current or former smoker. Osteoporosis. You may be screened starting at age 23 if you are at high risk. Talk with your health care provider about your test results, treatment options, and if necessary, the need for more tests. Vaccines  Your health care provider may recommend certain vaccines, such  as: Influenza vaccine. This is recommended every year. Tetanus, diphtheria, and acellular pertussis (Tdap, Td) vaccine. You may need a Td booster every 10 years. Zoster vaccine. You may need this after age 92. Pneumococcal 13-valent conjugate (PCV13) vaccine. One dose is recommended after age 60. Pneumococcal polysaccharide (PPSV23) vaccine. One dose is recommended after age 38. Talk to your health care provider about which screenings and vaccines you need and how often you need them. This information is not intended to replace advice given to you by your health care provider. Make sure you discuss any questions you have with your health care provider. Document Released: 03/05/2015 Document Revised: 10/27/2015 Document Reviewed: 12/08/2014 Elsevier Interactive Patient Education  2017 [place] Prevention in the Home Falls can cause injuries. They can happen to people of all ages. There are many things you can do to make your home safe and to help prevent falls. What can I do on the outside of my home? Regularly fix the edges of walkways and driveways and fix any cracks. Remove anything that might make you trip as you walk through a door, such as a raised step or threshold. Trim any bushes or trees on the path to your home. Use bright outdoor lighting. Clear any walking paths of anything that might make someone trip, such as rocks or tools. Regularly check to see if handrails are loose or broken. Make sure that both sides of any steps have handrails. Any raised decks and porches should have guardrails on the edges. Have any leaves, snow, or ice cleared regularly. Use sand or salt on walking paths during winter. Clean up any spills in your garage right away. This includes oil or grease spills. What can I do in the bathroom? Use night lights. Install grab bars by the toilet and in the tub and shower. Do not use towel bars as grab bars. Use non-skid mats or decals in the tub or  shower. If you need to sit down in the shower, use a plastic, non-slip stool. Keep the floor dry. Clean up any water that spills on the floor as soon as it happens. Remove soap buildup in the tub or shower regularly. Attach bath mats securely with double-sided non-slip rug tape. Do not have throw rugs and other things on the floor that can make you trip. What can I do in the bedroom? Use night lights. Make sure that you have a light by your bed that is easy to reach. Do not use any sheets or blankets that are too big for your bed. They should not hang down onto the floor. Have a firm chair that has side arms. You can use this for support while you get dressed. Do not have throw rugs and other things on the floor that can make you trip. What can I do in the kitchen? Clean up any spills right away. Avoid walking on wet floors. Keep items that you use a lot in easy-to-reach places. If you need to reach something above you, use a strong step stool that has a grab bar. Keep electrical cords out of  the way. Do not use floor polish or wax that makes floors slippery. If you must use wax, use non-skid floor wax. Do not have throw rugs and other things on the floor that can make you trip. What can I do with my stairs? Do not leave any items on the stairs. Make sure that there are handrails on both sides of the stairs and use them. Fix handrails that are broken or loose. Make sure that handrails are as long as the stairways. Check any carpeting to make sure that it is firmly attached to the stairs. Fix any carpet that is loose or worn. Avoid having throw rugs at the top or bottom of the stairs. If you do have throw rugs, attach them to the floor with carpet tape. Make sure that you have a light switch at the top of the stairs and the bottom of the stairs. If you do not have them, ask someone to add them for you. What else can I do to help prevent falls? Wear shoes that: Do not have high heels. Have  rubber bottoms. Are comfortable and fit you well. Are closed at the toe. Do not wear sandals. If you use a stepladder: Make sure that it is fully opened. Do not climb a closed stepladder. Make sure that both sides of the stepladder are locked into place. Ask someone to hold it for you, if possible. Clearly mark and make sure that you can see: Any grab bars or handrails. First and last steps. Where the edge of each step is. Use tools that help you move around (mobility aids) if they are needed. These include: Canes. Walkers. Scooters. Crutches. Turn on the lights when you go into a dark area. Replace any light bulbs as soon as they burn out. Set up your furniture so you have a clear path. Avoid moving your furniture around. If any of your floors are uneven, fix them. If there are any pets around you, be aware of where they are. Review your medicines with your doctor. Some medicines can make you feel dizzy. This can increase your chance of falling. Ask your doctor what other things that you can do to help prevent falls. This information is not intended to replace advice given to you by your health care provider. Make sure you discuss any questions you have with your health care provider. Document Released: 12/03/2008 Document Revised: 07/15/2015 Document Reviewed: 03/13/2014 Elsevier Interactive Patient Education  2017 [employer].

## 2021-11-25 NOTE — Progress Notes (Signed)
Subjective:   GRAESON NOURI is a 71 y.o. male who presents for an Initial Medicare Annual Wellness Visit.   Virtual Visit via Telephone Note  I connected with  [name] on 11/25/21 at 10:15 AM EDT by telephone and verified that I am speaking with the correct person using two identifiers.  Location: Patient: home  Provider: Esmond Plants  Persons participating in the virtual visit: [place]   I discussed the limitations, risks, security and privacy concerns of performing an evaluation and management service by telephone and the availability of in person appointments. The patient expressed understanding and agreed to proceed.  Interactive audio and video telecommunications were attempted between this nurse and patient, however failed, due to patient having technical difficulties OR patient did not have access to video capability.  We continued and completed visit with audio only.  Some vital signs may be absent or patient reported.   Daphane Shepherd, LPN  Review of Systems     Cardiac Risk Factors include: advanced age (>67mn, >>62women);hypertension     Objective:    Today's Vitals   11/25/21 1020  Weight: 180 lb (81.6 kg)  Height: '6\' 4"'$  (1.93 m)   Body mass index is 21.91 kg/m.     11/25/2021   10:26 AM 03/04/2021    6:14 AM 12/25/2019    7:38 AM 09/28/2019    4:46 AM 09/27/2019    6:50 PM 06/13/2017    9:38 PM 09/19/2016   11:35 AM  Advanced Directives  Does Patient Have a Medical Advance Directive? Yes No Yes  No No No  Type of AParamedicof [place]awayLiving will        Copy of H[place] Chart? No - copy requested        Would patient like information on creating a medical advance directive?  No - Patient declined  No - Patient declined  No - Patient declined     Current Medications (verified) Outpatient Encounter Medications as of 11/25/2021  Medication Sig   amLODipine (NORVASC) 10 MG tablet  TAKE 1 TABLET DAILY   ELIQUIS 2.5 MG TABS tablet TAKE 1 TABLET TWICE A DAY   ezetimibe (ZETIA) 10 MG tablet Take 1 tablet (10 mg total) by mouth daily.   Multiple Vitamins-Minerals (MULTIVITAMIN WITH MINERALS) tablet Take 1 tablet by mouth daily. Men 50 +   rosuvastatin (CRESTOR) 40 MG tablet TAKE 1 TABLET DAILY   tadalafil (CIALIS) 20 MG tablet Take 1 tablet (20 mg total) by mouth daily as needed for erectile dysfunction.   temazepam (RESTORIL) 15 MG capsule TAKE 1 CAPSULE BY MOUTH AT BEDTIME AS NEEDED   albuterol (VENTOLIN HFA) 108 (90 Base) MCG/ACT inhaler Inhale 2 puffs into the lungs every 6 (six) hours as needed for wheezing or shortness of breath. (Patient not taking: Reported on 11/25/2021)   chlorproMAZINE (THORAZINE) 10 MG tablet Take 1 tablet (10 mg total) by mouth 3 (three) times daily as needed for up to 10 doses. To help with hiccups (Patient not taking: Reported on 11/25/2021)   hydrOXYzine (ATARAX/VISTARIL) 10 MG tablet Take 1 tablet (10 mg total) by mouth 3 (three) times daily as needed for itching. (Patient not taking: Reported on 11/25/2021)   ondansetron (ZOFRAN) 4 MG tablet Take 1 tablet (4 mg total) by mouth every 6 (six) hours. (Patient not taking: Reported on 11/25/2021)   pantoprazole (PROTONIX) 40 MG tablet Take 1 tablet (40 mg total) by mouth  daily. (Patient not taking: Reported on 11/25/2021)   sucralfate (CARAFATE) 1 g tablet Take 1 tablet (1 g total) by mouth 4 (four) times daily. (Patient not taking: Reported on 11/25/2021)   tamsulosin (FLOMAX) 0.4 MG CAPS capsule Take 1 capsule (0.4 mg total) by mouth daily. (Patient not taking: Reported on 11/25/2021)   tiZANidine (ZANAFLEX) 2 MG tablet TAKE 1 TABLET BY MOUTH TWICE DAILY AS NEEDED FOR MUSCLE SPASM (Patient not taking: Reported on 11/25/2021)   No facility-administered encounter medications on file as of 11/25/2021.    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   CHEST PAIN 12/24/2009   Coronary artery calcification seen on CT scan 06/01/2015   Cramp of limb 07/19/2007   DIVERTICULOSIS, COLON 03/07/2007   DVT (deep venous thrombosis) ([place]) 01/2014   LLE   Emphysema of lung ([place])    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/23/2006   FLANK PAIN, LEFT 02/25/2010   FREQUENCY, URINARY 12/24/2009   HEMORRHOIDS 10/23/2006   HYPERLIPIDEMIA 10/23/2006   HYPERTENSION 10/23/2006   PE (pulmonary embolism) 02/2014   PLANTAR FASCIITIS, LEFT 11/02/2008   Pneumonia 12/2013 X 2   PSA, INCREASED 11/20/2007   PULMONARY NODULE 05/14/2008   Unspecified Peripheral Vascular Disease 10/23/2006   Past Surgical History:  Procedure Laterality Date   BIOPSY  09/29/2019   Procedure: BIOPSY;  Surgeon: Jerene Bears, MD;  [facility];  Service: Endoscopy;;   BIOPSY  12/25/2019   Procedure: BIOPSY;  Surgeon: Milus Banister, MD;  [facility];  Service: Endoscopy;;   ESOPHAGOGASTRODUODENOSCOPY (EGD) WITH PROPOFOL N/A 06/15/2017   Procedure: ESOPHAGOGASTRODUODENOSCOPY (EGD) WITH PROPOFOL;  Surgeon: Jerene Bears, MD;  [facility];  Service: Gastroenterology;  Laterality: N/A;   ESOPHAGOGASTRODUODENOSCOPY (EGD) WITH PROPOFOL N/A 09/29/2019   Procedure: ESOPHAGOGASTRODUODENOSCOPY (EGD) WITH PROPOFOL;  Surgeon: Jerene Bears, MD;  [facility];  Service: Endoscopy;  Laterality: N/A;   ESOPHAGOGASTRODUODENOSCOPY (EGD) WITH PROPOFOL N/A 12/25/2019   Procedure: ESOPHAGOGASTRODUODENOSCOPY (EGD) WITH PROPOFOL;  Surgeon: Milus Banister, MD;  [facility];  Service: Endoscopy;  Laterality: N/A;   EUS N/A 12/25/2019   Procedure: UPPER ENDOSCOPIC ULTRASOUND (EUS) RADIAL;  Surgeon: Milus Banister, MD;  [facility];  Service: Endoscopy;  Laterality:  N/A;   HEMORRHOID SURGERY  ?1990's   INGUINAL HERNIA REPAIR Bilateral ?2000's   IVC FILTER INSERTION N/A 10/03/2016   Procedure: IVC Filter Retrieveal;  Surgeon: Serafina Mitchell, MD;  Location: [facility];  Service: Cardiovascular;  Laterality: N/A;   MYRINGOTOMY WITH TUBE PLACEMENT Right 06/26/2018   SHOULDER ARTHROSCOPY W/ ROTATOR CUFF REPAIR Right 11/2013   STAPLING OF BLEBS Right 07/31/2016   Procedure: BLEBECTOMY;  Surgeon: Melrose Nakayama, MD;  Location: [facility] [place];  Service: Thoracic;  Laterality: Right;   VENA CAVA FILTER PLACEMENT Right 08/04/2016   Procedure: INSERTION VENA-CAVA FILTER;  Surgeon: Melrose Nakayama, MD;  Location: [facility];  Service: Thoracic;  Laterality: Right;   VIDEO ASSISTED THORACOSCOPY Right 07/31/2016   Procedure: VIDEO ASSISTED THORACOSCOPY;  Surgeon: Melrose Nakayama, MD;  Location: [place];  Service: Thoracic;  Laterality: Right;   VIDEO ASSISTED THORACOSCOPY Right 08/04/2016   Procedure: REDO VIDEO ASSISTED THORACOSCOPY- EVACUATION OF HEMOTHORAX;  Surgeon: Melrose Nakayama, MD;  Location: [place];  Service: Thoracic;  Laterality: Right;   VIDEO BRONCHOSCOPY WITH ENDOBRONCHIAL NAVIGATION N/A 03/11/2014   Procedure: VIDEO BRONCHOSCOPY WITH ENDOBRONCHIAL NAVIGATION;  Surgeon: Collene Gobble, MD;  [facility];  Service: Thoracic;  Laterality: N/A;   Family History  Problem Relation Age of Onset   Heart disease Mother    Heart disease Father    Kidney disease Sister        Renal transplant   Colon cancer Neg Hx    Rectal cancer Neg Hx    Social History   Socioeconomic History   Marital status: Married    Spouse name: Not on file   Number of children: 2   Years of education: Not on file   Highest education level: Not on file  Occupational History   Occupation: retired  Tobacco Use   Smoking status: Former    Packs/day: 1.00    Years: 15.00    Total pack years: 15.00    Types: Cigarettes   Smokeless tobacco: Never   Tobacco  comments:    "quit smoking cigarettes in the 1990's"  Vaping Use   Vaping Use: Never used  Substance and Sexual Activity   Alcohol use: Yes    Alcohol/week: 0.0 standard drinks of alcohol    Comment: patient states he stopped 4-5 days ago   Drug use: Yes    Types: Marijuana    Comment: daily use /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   Sexual activity: Yes  Other Topics Concern   Not on file  Social History Narrative   Not on file   Social Determinants of Health   Financial Resource Strain: Low Risk  (11/25/2021)   Overall Financial Resource Strain (CARDIA)    Difficulty of Paying Living Expenses: Not hard at all  Food Insecurity: No Food Insecurity (11/25/2021)   Hunger Vital Sign    Worried About Running Out of Food in the Last Year: Never true    Ran Out of Food in the Last Year: Never true  Transportation Needs: No Transportation Needs (11/25/2021)   PRAPARE - Hydrologist (Medical): No    Lack of Transportation (Non-Medical): No  Physical Activity: Insufficiently Active (11/25/2021)   Exercise Vital Sign    Days of Exercise per Week: 2 days    Minutes of Exercise per Session: 10 min  Stress: No Stress Concern Present (11/25/2021)   [place]    Feeling of Stress : Not at all  Social Connections: Moderately Integrated (11/25/2021)   Social Connection and Isolation Panel [NHANES]    Frequency of Communication with Friends and Family: More than three times a week    Frequency of Social Gatherings with Friends and Family: More than three times a week    Attends Religious Services: More than 4 times per year    Active Member of [employer] or Organizations: No    Attends Music therapist: Never    Marital Status: Married    Tobacco Counseling Counseling given: Not Answered Tobacco comments: "quit smoking cigarettes in the 1990's"   Clinical Intake:  Pre-visit preparation completed: Yes  Pain :  No/denies pain     Nutritional Risks: None Diabetes: No  How often do you need to have someone help you when you read instructions, pamphlets, or other written materials from your doctor or pharmacy?: 1 - Never  Diabetic?no   Interpreter Needed?: No  Information entered by :: Jadene Pierini, LPN   Activities of Daily  Living    11/25/2021   10:26 AM 03/07/2021    9:21 AM  In your present state of health, do you have any difficulty performing the following activities:  Hearing? 0 1  Vision? 0 0  Difficulty concentrating or making decisions? 0 0  Walking or climbing stairs? 0 0  Dressing or bathing? 0 0  Doing errands, shopping? 0 0  Preparing Food and eating ? N   Using the Toilet? N   In the past six months, have you accidently leaked urine? N   Do you have problems with loss of bowel control? N   Managing your Medications? N   Managing your Finances? N   Housekeeping or managing your Housekeeping? N     Patient Care Team: Biagio Borg, MD as PCP - General  Indicate any recent Medical Services you may have received from other than [facility] providers in the past year (date may be approximate).     Assessment:   This is a routine wellness examination for [place].  Hearing/Vision screen Vision Screening - Comments:: Annual eye exams wear glasses   Dietary issues and exercise activities discussed: Current Exercise Habits: Home exercise routine, Type of exercise: walking, Time (Minutes): 10, Frequency (Times/Week): 2, Weekly Exercise (Minutes/Week): 20, Intensity: Mild, Exercise limited by: None identified   Goals Addressed             This Visit's Progress    Exercise 3x per week (30 min per time)         Depression Screen    11/25/2021   10:25 AM 09/21/2021    8:20 AM 09/21/2021    8:07 AM 07/07/2021    1:55 PM 07/07/2021    1:46 PM 03/07/2021    9:29 AM 09/08/2020    9:09 AM  PHQ 2/9 Scores  PHQ - 2 Score 0 0 0 0 0 0 0  PHQ- 9 Score   1  0      Fall Risk     11/25/2021   10:21 AM 09/21/2021    8:19 AM 09/21/2021    8:07 AM 07/07/2021    1:55 PM 07/07/2021    1:45 PM  Fall Risk   Falls in the past year? 0 0 0 0 0  Number falls in past yr: 0 0 0 0 0  Injury with Fall? 0 0 0 0 0  Risk for fall due to : No Fall Risks      Follow up Falls prevention discussed        FALL RISK PREVENTION PERTAINING TO THE HOME:  Any stairs in or around the home? No  If so, are there any without handrails? No  Home free of loose throw rugs in walkways, pet beds, electrical cords, etc? Yes  Adequate lighting in your home to reduce risk of falls? Yes   ASSISTIVE DEVICES UTILIZED TO PREVENT FALLS:  Life alert? No  Use of a cane, walker or w/c? No  Grab bars in the bathroom? Yes  Shower chair or bench in shower? Yes  Elevated toilet seat or a handicapped toilet? Yes        11/25/2021   10:26 AM  6CIT Screen  What Year? 0 points  What month? 0 points  What time? 0 points  Count back from 20 0 points  Months in reverse 0 points  Repeat phrase 0 points  Total Score 0 points    Immunizations Immunization History  Administered Date(s) Administered   [employer] Vaccination  04/14/2019, 05/13/2019, 02/09/2020   PNEUMOCOCCAL CONJUGATE-20 09/08/2020   Td 02/20/1998, 11/02/2008    TDAP status: Due, Education has been provided regarding the importance of this vaccine. Advised may receive this vaccine at local pharmacy or Health Dept. Aware to provide a copy of the vaccination record if obtained from local pharmacy or Health Dept. Verbalized acceptance and understanding.  Flu Vaccine status: Due, Education has been provided regarding the importance of this vaccine. Advised may receive this vaccine at local pharmacy or Health Dept. Aware to provide a copy of the vaccination record if obtained from local pharmacy or Health Dept. Verbalized acceptance and understanding.  Pneumococcal vaccine status: Up to date  Covid-19 vaccine status: Completed  vaccines  Qualifies for Shingles Vaccine? Yes   Zostavax completed No   Shingrix Completed?: No.    Education has been provided regarding the importance of this vaccine. Patient has been advised to call insurance company to determine out of pocket expense if they have not yet received this vaccine. Advised may also receive vaccine at local pharmacy or Health Dept. Verbalized acceptance and understanding.  Screening Tests Health Maintenance  Topic Date Due   Zoster Vaccines- Shingrix (1 of 2) Never done   COLONOSCOPY (Pts 45-35yr Insurance coverage will need to be confirmed)  10/20/2021   TETANUS/TDAP  03/07/2022 (Originally 11/03/2018)   INFLUENZA VACCINE  05/21/2022 (Originally 09/20/2021)   Pneumonia Vaccine 71 Years old  Completed   Hepatitis C Screening  Completed   HPV VACCINES  Aged Out   COVID-19 Vaccine  Discontinued    Health Maintenance  Health Maintenance Due  Topic Date Due   Zoster Vaccines- Shingrix (1 of 2) Never done   COLONOSCOPY (Pts 45-470yrInsurance coverage will need to be confirmed)  10/20/2021    Colorectal cancer screening: Referral to GI placed 11/25/2021. Pt aware the office will call re: appt.  Lung Cancer Screening: (Low Dose CT Chest recommended if Age 69-80ears, 30 pack-year currently smoking OR have quit w/in 15years.) does not qualify.   Lung Cancer Screening Referral: n/a  Additional Screening:  Hepatitis C Screening: does not qualify;   Vision Screening: Recommended annual ophthalmology exams for early detection of glaucoma and other disorders of the eye. Is the patient up to date with their annual eye exam?  Yes  Who is the provider or what is the name of the office in which the patient attends annual eye exams[place] not established with a provider, would they like to be referred to a provider to establish care? No .   Dental Screening: Recommended annual dental exams for proper oral hygiene  Community Resource  Referral / Chronic Care Management: CRR required this visit?  No   CCM required this visit?  No      Plan:     I have personally reviewed and noted the following in the patient's chart:   Medical and social history Use of alcohol, tobacco or illicit drugs  Current medications and supplements including opioid prescriptions. Patient is not currently taking opioid prescriptions. Functional ability and status Nutritional status Physical activity Advanced directives List of other physicians Hospitalizations, surgeries, and ER visits in previous 12 months Vitals Screenings to include cognitive, depression, and falls Referrals and appointments  In addition, I have reviewed and discussed with patient certain preventive protocols, quality metrics, and best practice recommendations. A written personalized care plan for preventive services as well as general preventive health recommendations were provided to patient.  Daphane Shepherd, LPN   88/06/275   Nurse Notes: Due TDAP /Flu Vaccine        Subjective:   KEGHAN MCFARREN is a 71 y.o. male who presents for Medicare Annual/Subsequent preventive examination. Virtual Visit via Telephone Note  I connected with  [name] on 11/25/21 at 10:15 AM EDT by telephone and verified that I am speaking with the correct person using two identifiers.  Location: Patient: home  Provider: Cristopher Estimable  Persons participating in the virtual visit: patient/Nurse Health Advisor   I discussed the limitations, risks, security and privacy concerns of performing an evaluation and management service by telephone and the availability of in person appointments. The patient expressed understanding and agreed to proceed.  Interactive audio and video telecommunications were attempted between this nurse and patient, however failed, due to patient having technical difficulties OR patient did not have access to video capability.  We continued and  completed visit with audio only.  Some vital signs may be absent or patient reported.   Daphane Shepherd, LPN  Review of Systems     Cardiac Risk Factors include: advanced age (>50mn, >>75women);hypertension     Objective:    Today's Vitals   11/25/21 1020  Weight: 180 lb (81.6 kg)  Height: '6\' 4"'$  (1.93 m)   Body mass index is 21.91 kg/m.     11/25/2021   10:26 AM 03/04/2021    6:14 AM 12/25/2019    7:38 AM 09/28/2019    4:46 AM 09/27/2019    6:50 PM 06/13/2017    9:38 PM 09/19/2016   11:35 AM  Advanced Directives  Does Patient Have a Medical Advance Directive? Yes No Yes  No No No  Type of AParamedicof [place]arrisonLiving will        Copy of [place]ordin Chart? No - copy requested        Would patient like information on creating a medical advance directive?  No - Patient declined  No - Patient declined  No - Patient declined     Current Medications (verified) Outpatient Encounter Medications as of 11/25/2021  Medication Sig   amLODipine (NORVASC) 10 MG tablet TAKE 1 TABLET DAILY   ELIQUIS 2.5 MG TABS tablet TAKE 1 TABLET TWICE A DAY   ezetimibe (ZETIA) 10 MG tablet Take 1 tablet (10 mg total) by mouth daily.   Multiple Vitamins-Minerals (MULTIVITAMIN WITH MINERALS) tablet Take 1 tablet by mouth daily. Men 50 +   rosuvastatin (CRESTOR) 40 MG tablet TAKE 1 TABLET DAILY   tadalafil (CIALIS) 20 MG tablet Take 1 tablet (20 mg total) by mouth daily as needed for erectile dysfunction.   temazepam (RESTORIL) 15 MG capsule TAKE 1 CAPSULE BY MOUTH AT BEDTIME AS NEEDED   albuterol (VENTOLIN HFA) 108 (90 Base) MCG/ACT inhaler Inhale 2 puffs into the lungs every 6 (six) hours as needed for wheezing or shortness of breath. (Patient not taking: Reported on 11/25/2021)   chlorproMAZINE (THORAZINE) 10 MG tablet Take 1 tablet (10 mg total) by mouth 3 (three) times daily as needed for up to 10 doses. To help with hiccups (Patient not taking: Reported on  11/25/2021)   hydrOXYzine (ATARAX/VISTARIL) 10 MG tablet Take 1 tablet (10 mg total) by mouth 3 (three) times daily as needed for itching. (Patient not taking: Reported on 11/25/2021)   ondansetron (ZOFRAN) 4 MG tablet Take 1 tablet (4 mg total) by mouth every 6 (six) hours. (Patient not taking:  Reported on 11/25/2021)   pantoprazole (PROTONIX) 40 MG tablet Take 1 tablet (40 mg total) by mouth daily. (Patient not taking: Reported on 11/25/2021)   sucralfate (CARAFATE) 1 g tablet Take 1 tablet (1 g total) by mouth 4 (four) times daily. (Patient not taking: Reported on 11/25/2021)   tamsulosin (FLOMAX) 0.4 MG CAPS capsule Take 1 capsule (0.4 mg total) by mouth daily. (Patient not taking: Reported on 11/25/2021)   tiZANidine (ZANAFLEX) 2 MG tablet TAKE 1 TABLET BY MOUTH TWICE DAILY AS NEEDED FOR MUSCLE SPASM (Patient not taking: Reported on 11/25/2021)   No facility-administered encounter medications on file as of 11/25/2021.    Allergies (verified) Pylera [bis subcit-metronid-tetracyc]   History: Past Medical History:  Diagnosis Date   ABNORMAL ELECTROCARDIOGRAM 11/02/2008   ABSCESS, LUNG 10/23/2006   Anemia 05/26/2014   BACTERIAL PNEUMONIA 12/28/2009   BPH (benign prostatic hyperplasia) 11/22/2010   Cerebellar stroke ([place]) 05/26/2014   CHEST PAIN 12/24/2009   Coronary artery calcification seen on CT scan 06/01/2015   Cramp of limb 07/19/2007   DIVERTICULOSIS, COLON 03/07/2007   DVT (deep venous thrombosis) ([place]) 01/2014   LLE   Emphysema of lung ([place])    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/23/2006   FLANK PAIN, LEFT 02/25/2010   FREQUENCY, URINARY 12/24/2009   HEMORRHOIDS 10/23/2006   HYPERLIPIDEMIA 10/23/2006   HYPERTENSION 10/23/2006   PE (pulmonary embolism) 02/2014   PLANTAR FASCIITIS, LEFT 11/02/2008   Pneumonia 12/2013 X 2   PSA, INCREASED 11/20/2007   PULMONARY NODULE 05/14/2008   Unspecified Peripheral Vascular Disease 10/23/2006   Past Surgical History:  Procedure Laterality Date   BIOPSY  09/29/2019   Procedure:  BIOPSY;  Surgeon: Jerene Bears, MD;  [facility];  Service: Endoscopy;;   BIOPSY  12/25/2019   Procedure: BIOPSY;  Surgeon: Milus Banister, MD;  [facility];  Service: Endoscopy;;   ESOPHAGOGASTRODUODENOSCOPY (EGD) WITH PROPOFOL N/A 06/15/2017   Procedure: ESOPHAGOGASTRODUODENOSCOPY (EGD) WITH PROPOFOL;  Surgeon: Jerene Bears, MD;  [facility];  Service: Gastroenterology;  Laterality: N/A;   ESOPHAGOGASTRODUODENOSCOPY (EGD) WITH PROPOFOL N/A 09/29/2019   Procedure: ESOPHAGOGASTRODUODENOSCOPY (EGD) WITH PROPOFOL;  Surgeon: Jerene Bears, MD;  [facility];  Service: Endoscopy;  Laterality: N/A;   ESOPHAGOGASTRODUODENOSCOPY (EGD) WITH PROPOFOL N/A 12/25/2019   Procedure: ESOPHAGOGASTRODUODENOSCOPY (EGD) WITH PROPOFOL;  Surgeon: Milus Banister, MD;  [facility];  Service: Endoscopy;  Laterality: N/A;   EUS N/A 12/25/2019   Procedure: UPPER ENDOSCOPIC ULTRASOUND (EUS) RADIAL;  Surgeon: Milus Banister, MD;  [facility];  Service: Endoscopy;  Laterality: N/A;   HEMORRHOID SURGERY  ?1990's   INGUINAL HERNIA REPAIR Bilateral ?2000's   IVC FILTER INSERTION N/A 10/03/2016   Procedure: IVC Filter Retrieveal;  Surgeon: Serafina Mitchell, MD;  Location: [facility];  Service: Cardiovascular;  Laterality: N/A;   MYRINGOTOMY WITH TUBE PLACEMENT Right 06/26/2018   SHOULDER ARTHROSCOPY W/ ROTATOR CUFF REPAIR Right 11/2013   STAPLING OF BLEBS Right 07/31/2016   Procedure: BLEBECTOMY;  Surgeon: Melrose Nakayama, MD;  Location: [place];  Service: Thoracic;  Laterality: Right;   VENA CAVA FILTER PLACEMENT Right 08/04/2016   Procedure: INSERTION VENA-CAVA FILTER;  Surgeon: Melrose Nakayama, MD;  Location: [place];  Service: Thoracic;  Laterality: Right;   VIDEO ASSISTED THORACOSCOPY Right 07/31/2016   Procedure: VIDEO ASSISTED THORACOSCOPY;  Surgeon: Melrose Nakayama, MD;  Location: [place];  Service: Thoracic;  Laterality: Right;   VIDEO  ASSISTED THORACOSCOPY Right 08/04/2016   Procedure:  REDO VIDEO ASSISTED THORACOSCOPY- EVACUATION OF HEMOTHORAX;  Surgeon: Melrose Nakayama, MD;  [facility];  Service: Thoracic;  Laterality: Right;   VIDEO BRONCHOSCOPY WITH ENDOBRONCHIAL NAVIGATION N/A 03/11/2014   Procedure: VIDEO BRONCHOSCOPY WITH ENDOBRONCHIAL NAVIGATION;  Surgeon: Collene Gobble, MD;  [facility];  Service: Thoracic;  Laterality: N/A;   Family History  Problem Relation Age of Onset   Heart disease Mother    Heart disease Father    Kidney disease Sister        Renal transplant   Colon cancer Neg Hx    Rectal cancer Neg Hx    Social History   Socioeconomic History   Marital status: Married    Spouse name: Not on file   Number of children: 2   Years of education: Not on file   Highest education level: Not on file  Occupational History   Occupation: retired  Tobacco Use   Smoking status: Former    Packs/day: 1.00    Years: 15.00    Total pack years: 15.00    Types: Cigarettes   Smokeless tobacco: Never   Tobacco comments:    "quit smoking cigarettes in the 1990's"  Vaping Use   Vaping Use: Never used  Substance and Sexual Activity   Alcohol use: Yes    Alcohol/week: 0.0 standard drinks of alcohol    Comment: patient states he stopped 4-5 days ago   Drug use: Yes    Types: Marijuana    Comment: daily use /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   Sexual activity: Yes  Other Topics Concern   Not on file  Social History Narrative   Not on file   Social Determinants of Health   Financial Resource Strain: Low Risk  (11/25/2021)   Overall Financial Resource Strain (CARDIA)    Difficulty of Paying Living Expenses: Not hard at all  Food Insecurity: No Food Insecurity (11/25/2021)   Hunger Vital Sign    Worried About Running Out of Food in the Last Year: Never true    Ran Out of Food in the Last Year: Never true  Transportation Needs: No Transportation Needs (11/25/2021)   PRAPARE - Radiographer, therapeutic (Medical): No    Lack of Transportation (Non-Medical): No  Physical Activity: Insufficiently Active (11/25/2021)   Exercise Vital Sign    Days of Exercise per Week: 2 days    Minutes of Exercise per Session: 10 min  Stress: No Stress Concern Present (11/25/2021)   [place]    Feeling of Stress : Not at all  Social Connections: Moderately Integrated (11/25/2021)   Social Connection and Isolation Panel [NHANES]    Frequency of Communication with Friends and Family: More than three times a week    Frequency of Social Gatherings with Friends and Family: More than three times a week    Attends Religious Services: More than 4 times per year    Active Member of [employer] or Organizations: No    Attends Music therapist: Never    Marital Status: Married    Tobacco Counseling Counseling given: Not Answered Tobacco comments: "quit smoking cigarettes in the 1990's"   Clinical Intake:  Pre-visit preparation completed: Yes  Pain : No/denies pain     Nutritional Risks: None Diabetes: No  How often do you need to have someone help you when you read instructions, pamphlets, or other written materials from your doctor or pharmacy?: 1 - Never  Diabetic?no  Interpreter Needed?: No  Information entered by :: Jadene Pierini, LPN   Activities of Daily Living    11/25/2021   10:26 AM 03/07/2021    9:21 AM  In your present state of health, do you have any difficulty performing the following activities:  Hearing? 0 1  Vision? 0 0  Difficulty concentrating or making decisions? 0 0  Walking or climbing stairs? 0 0  Dressing or bathing? 0 0  Doing errands, shopping? 0 0  Preparing Food and eating ? N   Using the Toilet? N   In the past six months, have you accidently leaked urine? N   Do you have problems with loss of bowel control? N   Managing your Medications? N   Managing your Finances? N    Housekeeping or managing your Housekeeping? N     Patient Care Team: Biagio Borg, MD as PCP - General  Indicate any recent Medical Services you may have received from other than [facility] providers in the past year (date may be approximate).     Assessment:   This is a routine wellness examination for [place].  Hearing/Vision screen Vision Screening - Comments:: Annual eye exams wear glasses   Dietary issues and exercise activities discussed: Current Exercise Habits: Home exercise routine, Type of exercise: walking, Time (Minutes): 10, Frequency (Times/Week): 2, Weekly Exercise (Minutes/Week): 20, Intensity: Mild, Exercise limited by: None identified   Goals Addressed             This Visit's Progress    Exercise 3x per week (30 min per time)        Depression Screen    11/25/2021   10:25 AM 09/21/2021    8:20 AM 09/21/2021    8:07 AM 07/07/2021    1:55 PM 07/07/2021    1:46 PM 03/07/2021    9:29 AM 09/08/2020    9:09 AM  PHQ 2/9 Scores  PHQ - 2 Score 0 0 0 0 0 0 0  PHQ- 9 Score   1  0      Fall Risk    11/25/2021   10:21 AM 09/21/2021    8:19 AM 09/21/2021    8:07 AM 07/07/2021    1:55 PM 07/07/2021    1:45 PM  Fall Risk   Falls in the past year? 0 0 0 0 0  Number falls in past yr: 0 0 0 0 0  Injury with Fall? 0 0 0 0 0  Risk for fall due to : No Fall Risks      Follow up Falls prevention discussed        FALL RISK PREVENTION PERTAINING TO THE HOME:  Any stairs in or around the home? Yes  If so, are there any without handrails? Yes  Home free of loose throw rugs in walkways, pet beds, electrical cords, etc? No  Adequate lighting in your home to reduce risk of falls? Yes   ASSISTIVE DEVICES UTILIZED TO PREVENT FALLS:  Life alert? No  Use of a cane, walker or w/c? No  Grab bars in the bathroom? Yes  Shower chair or bench in shower? Yes  Elevated toilet seat or a handicapped toilet? Yes    Immunizations Immunization History  Administered Date(s) Administered    Moderna Sars-Covid-2 Vaccination 04/14/2019, 05/13/2019, 02/09/2020   PNEUMOCOCCAL CONJUGATE-20 09/08/2020   Td 02/20/1998, 11/02/2008    TDAP status: Due, Education has been provided regarding the importance of this vaccine. Advised may receive this vaccine at local pharmacy  or Health Dept. Aware to provide a copy of the vaccination record if obtained from local pharmacy or Health Dept. Verbalized acceptance and understanding.  Flu Vaccine status: Due, Education has been provided regarding the importance of this vaccine. Advised may receive this vaccine at local pharmacy or Health Dept. Aware to provide a copy of the vaccination record if obtained from local pharmacy or Health Dept. Verbalized acceptance and understanding.  Pneumococcal vaccine status: Up to date  Covid-19 vaccine status: Completed vaccines  Qualifies for Shingles Vaccine? Yes   Zostavax completed No   Shingrix Completed?: No.    Education has been provided regarding the importance of this vaccine. Patient has been advised to call insurance company to determine out of pocket expense if they have not yet received this vaccine. Advised may also receive vaccine at local pharmacy or Health Dept. Verbalized acceptance and understanding.  Screening Tests Health Maintenance  Topic Date Due   Zoster Vaccines- Shingrix (1 of 2) Never done   COLONOSCOPY (Pts 45-36yr Insurance coverage will need to be confirmed)  10/20/2021   TETANUS/TDAP  03/07/2022 (Originally 11/03/2018)   INFLUENZA VACCINE  05/21/2022 (Originally 09/20/2021)   Pneumonia Vaccine 69 Years old  Completed   Hepatitis C Screening  Completed   HPV VACCINES  Aged Out   COVID-19 Vaccine  Discontinued    Health Maintenance  Health Maintenance Due  Topic Date Due   Zoster Vaccines- Shingrix (1 of 2) Never done   COLONOSCOPY (Pts 45-443yrInsurance coverage will need to be confirmed)  10/20/2021    Colorectal cancer screening: Referral to GI placed 11/25/2021. Pt  aware the office will call re: appt.  Lung Cancer Screening: (Low Dose CT Chest recommended if Age 69-80ears, 30 pack-year currently smoking OR have quit w/in 15years.) does not qualify.   Lung Cancer Screening Referral: n/a  Additional Screening:  Hepatitis C Screening: does not qualify;   Vision Screening: Recommended annual ophthalmology exams for early detection of glaucoma and other disorders of the eye. Is the patient up to date with their annual eye exam?  Yes  Who is the provider or what is the name of the office in which the patient attends annual eye exams[place] is not established with a provider, would they like to be referred to a provider to establish care? No .   Dental Screening: Recommended annual dental exams for proper oral hygiene  Community Resource Referral / Chronic Care Management: CRR required this visit?  No   CCM required this visit?  No      Plan:     I have personally reviewed and noted the following in the patient's chart:   Medical and social history Use of alcohol, tobacco or illicit drugs  Current medications and supplements including opioid prescriptions. Patient is not currently taking opioid prescriptions. Functional ability and status Nutritional status Physical activity Advanced directives List of other physicians Hospitalizations, surgeries, and ER visits in previous 12 months Vitals Screenings to include cognitive, depression, and falls Referrals and appointments  In addition, I have reviewed and discussed with patient certain preventive protocols, quality metrics, and best practice recommendations. A written personalized care plan for preventive services as well as general preventive health recommendations were provided to patient.     LaDaphane ShepherdLPN   [id number] Nurse Notes: Due TDAP /Flu Vaccine

## 2022-01-02 ENCOUNTER — Encounter: Payer: Self-pay | Admitting: Physician Assistant

## 2022-01-02 ENCOUNTER — Ambulatory Visit (INDEPENDENT_AMBULATORY_CARE_PROVIDER_SITE_OTHER): Payer: Medicare Other | Admitting: Physician Assistant

## 2022-01-02 VITALS — BP 140/80 | HR 78 | Ht 76.0 in | Wt 186.4 lb

## 2022-01-02 DIAGNOSIS — Z86010 Personal history of colonic polyps: Secondary | ICD-10-CM

## 2022-01-02 DIAGNOSIS — Z7901 Long term (current) use of anticoagulants: Secondary | ICD-10-CM | POA: Diagnosis not present

## 2022-01-02 MED ORDER — NA SULFATE-K SULFATE-MG SULF 17.5-3.13-1.6 GM/177ML PO SOLN: 1.0000 | Freq: Once | ORAL | 0 refills | Status: AC

## 2022-01-02 NOTE — Patient Instructions (Signed)
You have been scheduled for a colonoscopy. Please follow written instructions given to you at your visit today.  Please pick up your prep supplies at the pharmacy within the next 1-3 days. If you use inhalers (even only as needed), please bring them with you on the day of your procedure.  _______________________________________________________  If you are age 69 or older, your body mass index should be between 23-30. Your Body mass index is 22.69 kg/m. If this is out of the aforementioned range listed, please consider follow up with your Primary Care Provider.  If you are age 2 or younger, your body mass index should be between 19-25. Your Body mass index is 22.69 kg/m. If this is out of the aformentioned range listed, please consider follow up with your Primary Care Provider.   ________________________________________________________  The [place] [facility] providers would like to encourage you to use [facility] to communicate with providers for non-urgent requests or questions.  Due to long hold times on the telephone, sending your provider a message by [facility] may be a faster and more efficient way to get a response.  Please allow 48 business hours for a response.  Please remember that this is for non-urgent requests.  _______________________________________________________

## 2022-01-02 NOTE — Progress Notes (Signed)
Chief Complaint: Screening for colorectal cancer on chronic anticoagulation  HPI:    Charles [name] is a 71 year old African-American male with a past medical history as listed below including DVT and PE on Eliquis (06/14/2017 echo with LVEF 55-60%), who presents to clinic today to discuss his surveillance colonoscopy.    11/10/2019 patient seen in clinic for recent hospitalization for melena.  At that time scheduled for EUS as below.    Today, patient presents to clinic and tells me that he thinks he is due for a colonoscopy.  He denies any acute GI complaints or concerns and has had no heart issues over the past 5 years.    Denies fever, chills, weight loss or blood in his stool.  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 Ardis Hughs: - Four 3 to 4 mm polyps in the descending colon, in the transverse colon, in the ascending colon and in the cecum, removed with a cold snare. Resected and retrieved. - One 12 mm polyp in the cecum, removed with a hot snare. Resected and retrieved. - Diverticulosis in the entire examined colon. - External and internal hemorrhoids. - The examination was otherwise normal on direct and retroflexion views. -  Pathology showed tubular adenomas repeat recommended in 3 years   06/15/2017 EGD by Dr. Hilarie Fredrickson in the hospital for melena: Normal other than 1 cm hiatal hernia  Past Medical History:  Diagnosis Date   ABNORMAL ELECTROCARDIOGRAM 11/02/2008   ABSCESS, LUNG 10/23/2006   Anemia 05/26/2014   BACTERIAL PNEUMONIA 12/28/2009   BPH (benign prostatic hyperplasia) 11/22/2010   Cerebellar stroke ([place]) 05/26/2014   CHEST PAIN 12/24/2009   Coronary artery calcification seen on CT scan 06/01/2015   Cramp of limb 07/19/2007    DIVERTICULOSIS, COLON 03/07/2007   DVT (deep venous thrombosis) ([place]) 01/2014   LLE   Emphysema of lung ([place])    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/23/2006   FLANK PAIN, LEFT 02/25/2010   FREQUENCY, URINARY 12/24/2009   HEMORRHOIDS 10/23/2006   HYPERLIPIDEMIA 10/23/2006   HYPERTENSION 10/23/2006   PE (pulmonary embolism) 02/2014   PLANTAR FASCIITIS, LEFT 11/02/2008   Pneumonia 12/2013 X 2   PSA, INCREASED 11/20/2007   PULMONARY NODULE 05/14/2008   Unspecified Peripheral Vascular Disease 10/23/2006    Past Surgical History:  Procedure Laterality Date   BIOPSY  09/29/2019   Procedure: BIOPSY;  Surgeon: Jerene Bears, MD;  Location: [facility];  Service: Endoscopy;;   BIOPSY  12/25/2019   Procedure: BIOPSY;  Surgeon: Milus Banister, MD;  Location: [facility];  Service: Endoscopy;;   ESOPHAGOGASTRODUODENOSCOPY (EGD) WITH PROPOFOL N/A 06/15/2017   Procedure: ESOPHAGOGASTRODUODENOSCOPY (EGD) WITH PROPOFOL;  Surgeon: Jerene Bears, MD;  Location: [facility];  Service: Gastroenterology;  Laterality: N/A;   ESOPHAGOGASTRODUODENOSCOPY (EGD) WITH PROPOFOL N/A 09/29/2019   Procedure: ESOPHAGOGASTRODUODENOSCOPY (EGD) WITH PROPOFOL;  Surgeon: Jerene Bears, MD;  Location: [facility];  Service: Endoscopy;  Laterality: N/A;   ESOPHAGOGASTRODUODENOSCOPY (EGD) WITH PROPOFOL N/A 12/25/2019   Procedure: ESOPHAGOGASTRODUODENOSCOPY (EGD) WITH PROPOFOL;  Surgeon: Milus Banister, MD;  Location: [facility];  Service: Endoscopy;  Laterality: N/A;   EUS N/A 12/25/2019   Procedure: UPPER ENDOSCOPIC ULTRASOUND (EUS) RADIAL;  Surgeon: Milus Banister, MD;  Location: [facility];  Service: Endoscopy;  Laterality: N/A;   HEMORRHOID SURGERY  ?  1990's   INGUINAL HERNIA REPAIR Bilateral ?2000's   IVC FILTER INSERTION N/A 10/03/2016   Procedure: IVC Filter Retrieveal;  Surgeon: Serafina Mitchell, MD;  Location: [place] [facility];  Service: Cardiovascular;  Laterality: N/A;   MYRINGOTOMY WITH TUBE PLACEMENT Right 06/26/2018   SHOULDER  ARTHROSCOPY W/ ROTATOR CUFF REPAIR Right 11/2013   STAPLING OF BLEBS Right 07/31/2016   Procedure: BLEBECTOMY;  Surgeon: Melrose Nakayama, MD;  Location: [facility];  Service: Thoracic;  Laterality: Right;   VENA CAVA FILTER PLACEMENT Right 08/04/2016   Procedure: INSERTION VENA-CAVA FILTER;  Surgeon: Melrose Nakayama, MD;  Location: [place];  Service: Thoracic;  Laterality: Right;   VIDEO ASSISTED THORACOSCOPY Right 07/31/2016   Procedure: VIDEO ASSISTED THORACOSCOPY;  Surgeon: Melrose Nakayama, MD;  Location: [facility];  Service: Thoracic;  Laterality: Right;   VIDEO ASSISTED THORACOSCOPY Right 08/04/2016   Procedure: REDO VIDEO ASSISTED THORACOSCOPY- EVACUATION OF HEMOTHORAX;  Surgeon: Melrose Nakayama, MD;  Location: [place];  Service: Thoracic;  Laterality: Right;   VIDEO BRONCHOSCOPY WITH ENDOBRONCHIAL NAVIGATION N/A 03/11/2014   Procedure: VIDEO BRONCHOSCOPY WITH ENDOBRONCHIAL NAVIGATION;  Surgeon: Collene Gobble, MD;  Location: [facility];  Service: Thoracic;  Laterality: N/A;    Current Outpatient Medications  Medication Sig Dispense Refill   albuterol (VENTOLIN HFA) 108 (90 Base) MCG/ACT inhaler Inhale 2 puffs into the lungs every 6 (six) hours as needed for wheezing or shortness of breath. (Patient not taking: Reported on 11/25/2021) 8 g 5   amLODipine (NORVASC) 10 MG tablet TAKE 1 TABLET DAILY 90 tablet 3   chlorproMAZINE (THORAZINE) 10 MG tablet Take 1 tablet (10 mg total) by mouth 3 (three) times daily as needed for up to 10 doses. To help with hiccups (Patient not taking: Reported on 11/25/2021) 10 tablet 0   ELIQUIS 2.5 MG TABS tablet TAKE 1 TABLET TWICE A DAY 180 tablet 3   ezetimibe (ZETIA) 10 MG tablet Take 1 tablet (10 mg total) by mouth daily. 30 tablet 11   hydrOXYzine (ATARAX/VISTARIL) 10 MG tablet Take 1 tablet (10 mg total) by mouth 3 (three) times daily as needed for itching. (Patient not taking: Reported on 11/25/2021) 60 tablet 1   Multiple Vitamins-Minerals (MULTIVITAMIN  WITH MINERALS) tablet Take 1 tablet by mouth daily. Men 50 +     ondansetron (ZOFRAN) 4 MG tablet Take 1 tablet (4 mg total) by mouth every 6 (six) hours. (Patient not taking: Reported on 11/25/2021) 12 tablet 0   pantoprazole (PROTONIX) 40 MG tablet Take 1 tablet (40 mg total) by mouth daily. (Patient not taking: Reported on 11/25/2021) 90 tablet 3   rosuvastatin (CRESTOR) 40 MG tablet TAKE 1 TABLET DAILY 90 tablet 3   sucralfate (CARAFATE) 1 g tablet Take 1 tablet (1 g total) by mouth 4 (four) times daily. (Patient not taking: Reported on 11/25/2021) 120 tablet 0   tadalafil (CIALIS) 20 MG tablet Take 1 tablet (20 mg total) by mouth daily as needed for erectile dysfunction. 6 tablet 8   tamsulosin (FLOMAX) 0.4 MG CAPS capsule Take 1 capsule (0.4 mg total) by mouth daily. (Patient not taking: Reported on 11/25/2021) 90 capsule 3   temazepam (RESTORIL) 15 MG capsule TAKE 1 CAPSULE BY MOUTH AT BEDTIME AS NEEDED 60 capsule 2   tiZANidine (ZANAFLEX) 2 MG tablet TAKE 1 TABLET BY MOUTH TWICE DAILY AS NEEDED FOR MUSCLE SPASM (Patient not taking: Reported on 11/25/2021) 60 tablet 2   No current facility-administered medications for this visit.  Allergies as of 01/02/2022 - Review Complete 01/02/2022  Allergen Reaction Noted   Pylera [bis subcit-metronid-tetracyc]  10/22/2019    Family History  Problem Relation Age of Onset   Heart disease Mother    Heart disease Father    Kidney disease Sister        Renal transplant   Colon cancer Neg Hx    Rectal cancer Neg Hx     Social History   Socioeconomic History   Marital status: Married    Spouse name: Not on file   Number of children: 2   Years of education: Not on file   Highest education level: Not on file  Occupational History   Occupation: retired  Tobacco Use   Smoking status: Former    Packs/day: 1.00    Years: 15.00    Total pack years: 15.00    Types: Cigarettes   Smokeless tobacco: Never   Tobacco comments:    "quit smoking  cigarettes in the 1990's"  Vaping Use   Vaping Use: Never used  Substance and Sexual Activity   Alcohol use: Yes    Alcohol/week: 0.0 standard drinks of alcohol    Comment: patient states he stopped 4-5 days ago   Drug use: Yes    Types: Marijuana    Comment: daily use /yesterday   Sexual activity: Yes  Other Topics Concern   Not on file  Social History Narrative   Not on file   Social Determinants of Health   Financial Resource Strain: Low Risk  (11/25/2021)   Overall Financial Resource Strain (CARDIA)    Difficulty of Paying Living Expenses: Not hard at all  Food Insecurity: No Food Insecurity (11/25/2021)   Hunger Vital Sign    Worried About Running Out of Food in the Last Year: Never true    Ran Out of Food in the Last Year: Never true  Transportation Needs: No Transportation Needs (11/25/2021)   PRAPARE - Hydrologist (Medical): No    Lack of Transportation (Non-Medical): No  Physical Activity: Insufficiently Active (11/25/2021)   Exercise Vital Sign    Days of Exercise per Week: 2 days    Minutes of Exercise per Session: 10 min  Stress: No Stress Concern Present (11/25/2021)   [place]    Feeling of Stress : Not at all  Social Connections: Moderately Integrated (11/25/2021)   Social Connection and Isolation Panel [NHANES]    Frequency of Communication with Friends and Family: More than three times a week    Frequency of Social Gatherings with Friends and Family: More than three times a week    Attends Religious Services: More than 4 times per year    Active Member of [employer] or Organizations: No    Attends Archivist Meetings: Never    Marital Status: Married  Human resources officer Violence: Not At Risk (11/25/2021)   Humiliation, Afraid, Rape, and Kick questionnaire    Fear of Current or Ex-Partner: No    Emotionally Abused: No    Physically Abused: No    Sexually  Abused: No    Review of Systems:    Constitutional: No weight loss, fever or chills  Cardiovascular: No chest pain  Respiratory: No SOB  Gastrointestinal: See HPI and otherwise negative   Physical Exam:  Vital signs: BP (!) 140/80   Pulse 78   Ht '6\' 4"'$  (1.93 m)   Wt 186 lb 6.4 oz (84.6 kg)  SpO2 98%   BMI 22.69 kg/m    Constitutional:   Pleasant AA male appears to be in NAD, Well developed, Well nourished, alert and cooperative Respiratory: Respirations even and unlabored. Lungs clear to auscultation bilaterally.   No wheezes, crackles, or rhonchi.  Cardiovascular: Normal S1, S2. No MRG. Regular rate and rhythm. No peripheral edema, cyanosis or pallor.  Gastrointestinal:  Soft, nondistended, nontender. No rebound or guarding. Normal bowel sounds. No appreciable masses or hepatomegaly. Rectal:  Not performed.  Psychiatric: Oriented to person, place and time. Demonstrates good judgement and reason without abnormal affect or behaviors.  RELEVANT LABS AND IMAGING: CBC    Component Value Date/Time   WBC 6.2 07/07/2021 1414   RBC 4.10 (L) 07/07/2021 1414   HGB 12.9 (L) 07/07/2021 1414   HCT 38.3 (L) 07/07/2021 1414   PLT 182.0 07/07/2021 1414   MCV 93.4 07/07/2021 1414   MCH 31.5 03/04/2021 0617   MCHC 33.6 07/07/2021 1414   RDW 14.0 07/07/2021 1414   LYMPHSABS 2.2 07/07/2021 1414   MONOABS 0.9 07/07/2021 1414   EOSABS 0.2 07/07/2021 1414   BASOSABS 0.0 07/07/2021 1414    CMP     Component Value Date/Time   NA 138 07/07/2021 1414   K 4.1 07/07/2021 1414   CL 109 07/07/2021 1414   CO2 24 07/07/2021 1414   GLUCOSE 87 07/07/2021 1414   BUN 26 (H) 07/07/2021 1414   CREATININE 1.97 (H) 07/07/2021 1414   CREATININE 2.34 (H) 10/22/2019 0913   CALCIUM 9.1 07/07/2021 1414   PROT 7.1 07/07/2021 1414   ALBUMIN 4.2 07/07/2021 1414   AST 13 07/07/2021 1414   ALT 10 07/07/2021 1414   ALKPHOS 45 07/07/2021 1414   BILITOT 0.5 07/07/2021 1414   GFRNONAA 22 (L) 03/04/2021 0617    GFRAA 38 (L) 09/30/2019 0826    Assessment: 1.  History of adenomatous polyps: Last colonoscopy in August of 2020 with repeat recommended 3 years 2.  History of PE and DVT: On Eliquis  Plan: 1.  Scheduled patient for surveillance colonoscopy in the [place] with Dr. Loletha Carrow today in lieu of Dr. Ardis Hughs absence.  Did provide the patient a detailed list of risks for the procedure and he agrees to proceed. Patient is appropriate for endoscopic procedure(s) in the ambulatory ([facility]) setting.  2.  Patient was advised to hold his Eliquis for 2 days prior to time of procedure.  We will communicate with his prescribing physician to ensure this is acceptable for him. 3.  Patient to follow in clinic per recommendations after time of procedure.  Ellouise Newer, PA-C [facility] 01/02/2022, 9:39 AM  Cc: Biagio Borg, MD

## 2022-01-03 NOTE — Progress Notes (Signed)
____________________________________________________________  Attending physician addendum:  Thank you for sending this case to me. I have reviewed the entire note and agree with the plan.  For documentation purposes: LVEF normal last echocardiogram April 2019.  Wilfrid Lund, MD  ____________________________________________________________

## 2022-01-04 ENCOUNTER — Telehealth: Payer: Self-pay | Admitting: *Deleted

## 2022-01-04 NOTE — Telephone Encounter (Signed)
OK for off eliquis for 2 days prior to procedure, thanks

## 2022-01-04 NOTE — Telephone Encounter (Signed)
   Charles [name] 1950-05-06 [id number]  Dear Dr. Jenny Reichmann:  We have scheduled the above named patient for a(n) colonoscopy procedure. Our records show that (s)he is on anticoagulation therapy.  Please advise as to whether the patient may come off their therapy of Eliquis 2 days prior to their procedure which is scheduled for Tuesday 02/07/22.  Please route your response to Caryl Asp, [place] or fax response to [phone].  Sincerely,   Caryl Asp, [facility]

## 2022-01-06 NOTE — Telephone Encounter (Signed)
Left message for patient to call office.  

## 2022-01-18 NOTE — Telephone Encounter (Signed)
Left message for patient to call office.  

## 2022-01-23 NOTE — Telephone Encounter (Signed)
Patient informed to hold Eliquis 2 days. Patient voiced understanding.

## 2022-02-01 ENCOUNTER — Ambulatory Visit: Payer: Medicare Other | Admitting: Emergency Medicine

## 2022-02-03 ENCOUNTER — Encounter: Payer: Self-pay | Admitting: Family Medicine

## 2022-02-03 ENCOUNTER — Ambulatory Visit (INDEPENDENT_AMBULATORY_CARE_PROVIDER_SITE_OTHER): Payer: Medicare Other

## 2022-02-03 ENCOUNTER — Ambulatory Visit (HOSPITAL_BASED_OUTPATIENT_CLINIC_OR_DEPARTMENT_OTHER)
Admission: RE | Admit: 2022-02-03 | Discharge: 2022-02-03 | Disposition: A | Discharge status: Home / Self Care | Payer: Medicare Other | Source: Ambulatory Visit | Attending: Family Medicine | Admitting: Family Medicine

## 2022-02-03 ENCOUNTER — Ambulatory Visit (HOSPITAL_BASED_OUTPATIENT_CLINIC_OR_DEPARTMENT_OTHER): Payer: Medicare Other

## 2022-02-03 ENCOUNTER — Other Ambulatory Visit: Payer: Self-pay | Admitting: Family Medicine

## 2022-02-03 ENCOUNTER — Ambulatory Visit (INDEPENDENT_AMBULATORY_CARE_PROVIDER_SITE_OTHER): Payer: Medicare Other | Admitting: Family Medicine

## 2022-02-03 VITALS — BP 110/64 | HR 75 | Temp 97.8°F | Ht 76.0 in | Wt 178.0 lb

## 2022-02-03 DIAGNOSIS — R142 Eructation: Secondary | ICD-10-CM

## 2022-02-03 DIAGNOSIS — R1319 Other dysphagia: Secondary | ICD-10-CM

## 2022-02-03 DIAGNOSIS — K219 Gastro-esophageal reflux disease without esophagitis: Secondary | ICD-10-CM | POA: Diagnosis not present

## 2022-02-03 DIAGNOSIS — R6881 Early satiety: Secondary | ICD-10-CM

## 2022-02-03 DIAGNOSIS — J439 Emphysema, unspecified: Secondary | ICD-10-CM | POA: Diagnosis not present

## 2022-02-03 DIAGNOSIS — J189 Pneumonia, unspecified organism: Secondary | ICD-10-CM

## 2022-02-03 DIAGNOSIS — R0789 Other chest pain: Secondary | ICD-10-CM

## 2022-02-03 LAB — LIPASE: Lipase: 92 U/L — ABNORMAL HIGH (ref 11.0–59.0)

## 2022-02-03 LAB — COMPREHENSIVE METABOLIC PANEL
ALT: 9 U/L (ref 0–53)
AST: 14 U/L (ref 0–37)
Albumin: 3.9 g/dL (ref 3.5–5.2)
Alkaline Phosphatase: 37 U/L — ABNORMAL LOW (ref 39–117)
BUN: 28 mg/dL — ABNORMAL HIGH (ref 6–23)
CO2: 23 mEq/L (ref 19–32)
Calcium: 8.9 mg/dL (ref 8.4–10.5)
Chloride: 110 mEq/L (ref 96–112)
Creatinine, Ser: 2.27 mg/dL — ABNORMAL HIGH (ref 0.40–1.50)
GFR: 28.73 mL/min — ABNORMAL LOW (ref >60.00)
Glucose, Bld: 85 mg/dL (ref 70–99)
Potassium: 4.4 mEq/L (ref 3.5–5.1)
Sodium: 140 mEq/L (ref 135–145)
Total Bilirubin: 0.5 mg/dL (ref 0.2–1.2)
Total Protein: 7.3 g/dL (ref 6.0–8.3)

## 2022-02-03 LAB — CBC WITH DIFFERENTIAL/PLATELET
Basophils Absolute: 0 10*3/uL (ref 0.0–0.1)
Basophils Relative: 0.5 % (ref 0.0–3.0)
Eosinophils Absolute: 0 10*3/uL (ref 0.0–0.7)
Eosinophils Relative: 0.4 % (ref 0.0–5.0)
HCT: 38.4 % — ABNORMAL LOW (ref 39.0–52.0)
Hemoglobin: 12.9 g/dL — ABNORMAL LOW (ref 13.0–17.0)
Lymphocytes Relative: 14.3 % (ref 12.0–46.0)
Lymphs Abs: 1.2 10*3/uL (ref 0.7–4.0)
MCHC: 33.6 g/dL (ref 30.0–36.0)
MCV: 95.4 fl (ref 78.0–100.0)
Monocytes Absolute: 1.6 10*3/uL — ABNORMAL HIGH (ref 0.1–1.0)
Monocytes Relative: 20 % — ABNORMAL HIGH (ref 3.0–12.0)
Neutro Abs: 5.3 10*3/uL (ref 1.4–7.7)
Neutrophils Relative %: 64.8 % (ref 43.0–77.0)
Platelets: 197 10*3/uL (ref 150.0–400.0)
RBC: 4.03 Mil/uL — ABNORMAL LOW (ref 4.22–5.81)
RDW: 14.6 % (ref 11.5–15.5)
WBC: 8.2 10*3/uL (ref 4.0–10.5)

## 2022-02-03 LAB — TROPONIN I (HIGH SENSITIVITY): High Sens Troponin I: 8 ng/L (ref 2–17)

## 2022-02-03 MED ORDER — AMOXICILLIN-POT CLAVULANATE 500-125 MG PO TABS: 1.0000 | ORAL_TABLET | Freq: Three times a day (TID) | ORAL | 0 refills | Status: DC

## 2022-02-03 MED ORDER — PANTOPRAZOLE SODIUM 40 MG PO TBEC: 40.0000 mg | DELAYED_RELEASE_TABLET | Freq: Every day | ORAL | 0 refills | Status: DC

## 2022-02-03 NOTE — Progress Notes (Signed)
Subjective:     Patient ID: Charles [name], male    DOB: 02-12-1951, 71 y.o.   MRN: [id number]  Chief Complaint  Patient presents with   Gastroesophageal Reflux    Every time he eats or drinks something he burps, slight uncomfortable feeling on right side of chest    Gastroesophageal Reflux   Patient is in today for a 1-2 wk hx of belching, early satiety, difficulty getting food down and right sided chest pain that is dull but occasionally sharp for several seconds.   Hx of GERD. Started back on pantoprazole  Denies fever, chills, dizziness, palpitations, shortness of breath, abdominal pain, vomiting or diarrhea. No urinary symptoms. No LE edema.   Hx of COPD, HTN, stage 3 CKD and CAD.   Normal bowel movement today.   States he is scheduled to have a colonoscopy next week.    Health Maintenance Due  Topic Date Due   Zoster Vaccines- Shingrix (1 of 2) Never done   DTaP/Tdap/Td (3 - Tdap) 11/03/2018   COLONOSCOPY (Pts 45-28yr Insurance coverage will need to be confirmed)  10/20/2021    Past Medical History:  Diagnosis Date   ABNORMAL ELECTROCARDIOGRAM 11/02/2008   ABSCESS, LUNG 10/23/2006   Anemia 05/26/2014   BACTERIAL PNEUMONIA 12/28/2009   BPH (benign prostatic hyperplasia) 11/22/2010   Cerebellar stroke ([facility] 05/26/2014   CHEST PAIN 12/24/2009   Coronary artery calcification seen on CT scan 06/01/2015   Cramp of limb 07/19/2007   DIVERTICULOSIS, COLON 03/07/2007   DVT (deep venous thrombosis) ([facility] 01/2014   LLE   Emphysema of lung ([facility]    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/23/2006   FLANK PAIN, LEFT 02/25/2010   FREQUENCY, URINARY 12/24/2009   HEMORRHOIDS 10/23/2006   HYPERLIPIDEMIA 10/23/2006   HYPERTENSION 10/23/2006   PE (pulmonary embolism) 02/2014   PLANTAR FASCIITIS, LEFT 11/02/2008   Pneumonia 12/2013 X 2   PSA, INCREASED 11/20/2007   PULMONARY NODULE 05/14/2008   Unspecified Peripheral Vascular Disease 10/23/2006    Past Surgical History:  Procedure Laterality Date   BIOPSY   09/29/2019   Procedure: BIOPSY;  Surgeon: PJerene Bears MD;  Location: [facility];  Service: Endoscopy;;   BIOPSY  12/25/2019   Procedure: BIOPSY;  Surgeon: JMilus Banister MD;  Location: [facility];  Service: Endoscopy;;   ESOPHAGOGASTRODUODENOSCOPY (EGD) WITH PROPOFOL N/A 06/15/2017   Procedure: ESOPHAGOGASTRODUODENOSCOPY (EGD) WITH PROPOFOL;  Surgeon: PJerene Bears MD;  Location: [facility];  Service: Gastroenterology;  Laterality: N/A;   ESOPHAGOGASTRODUODENOSCOPY (EGD) WITH PROPOFOL N/A 09/29/2019   Procedure: ESOPHAGOGASTRODUODENOSCOPY (EGD) WITH PROPOFOL;  Surgeon: PJerene Bears MD;  Location: [facility];  Service: Endoscopy;  Laterality: N/A;   ESOPHAGOGASTRODUODENOSCOPY (EGD) WITH PROPOFOL N/A 12/25/2019   Procedure: ESOPHAGOGASTRODUODENOSCOPY (EGD) WITH PROPOFOL;  Surgeon: JMilus Banister MD;  Location: [facility];  Service: Endoscopy;  Laterality: N/A;   EUS N/A 12/25/2019   Procedure: UPPER ENDOSCOPIC ULTRASOUND (EUS) RADIAL;  Surgeon: JMilus Banister MD;  Location: [facility];  Service: Endoscopy;  Laterality: N/A;   HEMORRHOID SURGERY  ?1990's   INGUINAL HERNIA REPAIR Bilateral ?2000's   IVC FILTER INSERTION N/A 10/03/2016   Procedure: IVC Filter Retrieveal;  Surgeon: BSerafina Mitchell MD;  [facility];  Service: Cardiovascular;  Laterality: N/A;   MYRINGOTOMY WITH TUBE PLACEMENT Right 06/26/2018   SHOULDER ARTHROSCOPY W/ ROTATOR CUFF REPAIR Right 11/2013   STAPLING OF BLEBS Right 07/31/2016   Procedure: BLEBECTOMY;  Surgeon: HMelrose Nakayama MD;  [place] [facility]  Service: Thoracic;  Laterality: Right;   VENA CAVA FILTER PLACEMENT Right 08/04/2016   Procedure: INSERTION VENA-CAVA FILTER;  Surgeon: Melrose Nakayama, MD;  Location: [facility];  Service: Thoracic;  Laterality: Right;   VIDEO ASSISTED THORACOSCOPY Right 07/31/2016   Procedure: VIDEO ASSISTED THORACOSCOPY;  Surgeon: Melrose Nakayama, MD;  Location: [facility];  Service: Thoracic;   Laterality: Right;   VIDEO ASSISTED THORACOSCOPY Right 08/04/2016   Procedure: REDO VIDEO ASSISTED THORACOSCOPY- EVACUATION OF HEMOTHORAX;  Surgeon: Melrose Nakayama, MD;  Location: [facility];  Service: Thoracic;  Laterality: Right;   VIDEO BRONCHOSCOPY WITH ENDOBRONCHIAL NAVIGATION N/A 03/11/2014   Procedure: VIDEO BRONCHOSCOPY WITH ENDOBRONCHIAL NAVIGATION;  Surgeon: Collene Gobble, MD;  Location: [place];  Service: Thoracic;  Laterality: N/A;    Family History  Problem Relation Age of Onset   Heart disease Mother    Heart disease Father    Kidney disease Sister        Renal transplant   Colon cancer Neg Hx    Rectal cancer Neg Hx     Social History   Socioeconomic History   Marital status: Married    Spouse name: Not on file   Number of children: 2   Years of education: Not on file   Highest education level: Not on file  Occupational History   Occupation: retired  Tobacco Use   Smoking status: Former    Packs/day: 1.00    Years: 15.00    Total pack years: 15.00    Types: Cigarettes   Smokeless tobacco: Never   Tobacco comments:    "quit smoking cigarettes in the 1990's"  Vaping Use   Vaping Use: Never used  Substance and Sexual Activity   Alcohol use: Yes    Alcohol/week: 0.0 standard drinks of alcohol    Comment: patient states he stopped 4-5 days ago   Drug use: Yes    Types: Marijuana    Comment: daily use /yesterday   Sexual activity: Yes  Other Topics Concern   Not on file  Social History Narrative   Not on file   Social Determinants of Health   Financial Resource Strain: Low Risk  (11/25/2021)   Overall Financial Resource Strain (CARDIA)    Difficulty of Paying Living Expenses: Not hard at all  Food Insecurity: No Food Insecurity (11/25/2021)   Hunger Vital Sign    Worried About Running Out of Food in the Last Year: Never true    Ran Out of Food in the Last Year: Never true  Transportation Needs: No Transportation Needs (11/25/2021)   PRAPARE -  Hydrologist (Medical): No    Lack of Transportation (Non-Medical): No  Physical Activity: Insufficiently Active (11/25/2021)   Exercise Vital Sign    Days of Exercise per Week: 2 days    Minutes of Exercise per Session: 10 min  Stress: No Stress Concern Present (11/25/2021)   [place]    Feeling of Stress : Not at all  Social Connections: Moderately Integrated (11/25/2021)   Social Connection and Isolation Panel [NHANES]    Frequency of Communication with Friends and Family: More than three times a week    Frequency of Social Gatherings with Friends and Family: More than three times a week    Attends Religious Services: More than 4 times per year    Active Member of [employer] or Organizations: No    Attends Archivist Meetings: Never  Marital Status: Married  Human resources officer Violence: Not At Risk (11/25/2021)   Humiliation, Afraid, Rape, and Kick questionnaire    Fear of Current or Ex-Partner: No    Emotionally Abused: No    Physically Abused: No    Sexually Abused: No    Outpatient Medications Prior to Visit  Medication Sig Dispense Refill   albuterol (VENTOLIN HFA) 108 (90 Base) MCG/ACT inhaler Inhale 2 puffs into the lungs every 6 (six) hours as needed for wheezing or shortness of breath. 8 g 5   amLODipine (NORVASC) 10 MG tablet TAKE 1 TABLET DAILY 90 tablet 3   chlorproMAZINE (THORAZINE) 10 MG tablet Take 1 tablet (10 mg total) by mouth 3 (three) times daily as needed for up to 10 doses. To help with hiccups 10 tablet 0   ELIQUIS 2.5 MG TABS tablet TAKE 1 TABLET TWICE A DAY 180 tablet 3   ezetimibe (ZETIA) 10 MG tablet Take 1 tablet (10 mg total) by mouth daily. 30 tablet 11   hydrOXYzine (ATARAX/VISTARIL) 10 MG tablet Take 1 tablet (10 mg total) by mouth 3 (three) times daily as needed for itching. 60 tablet 1   Multiple Vitamins-Minerals (MULTIVITAMIN WITH MINERALS) tablet  Take 1 tablet by mouth daily. Men 50 +     ondansetron (ZOFRAN) 4 MG tablet Take 1 tablet (4 mg total) by mouth every 6 (six) hours. 12 tablet 0   rosuvastatin (CRESTOR) 40 MG tablet TAKE 1 TABLET DAILY 90 tablet 3   tadalafil (CIALIS) 20 MG tablet Take 1 tablet (20 mg total) by mouth daily as needed for erectile dysfunction. 6 tablet 8   tamsulosin (FLOMAX) 0.4 MG CAPS capsule Take 1 capsule (0.4 mg total) by mouth daily. 90 capsule 3   temazepam (RESTORIL) 15 MG capsule TAKE 1 CAPSULE BY MOUTH AT BEDTIME AS NEEDED 60 capsule 2   tiZANidine (ZANAFLEX) 2 MG tablet TAKE 1 TABLET BY MOUTH TWICE DAILY AS NEEDED FOR MUSCLE SPASM 60 tablet 2   pantoprazole (PROTONIX) 40 MG tablet Take 1 tablet (40 mg total) by mouth daily. 90 tablet 3   sucralfate (CARAFATE) 1 g tablet Take 1 tablet (1 g total) by mouth 4 (four) times daily. (Patient not taking: Reported on 02/03/2022) 120 tablet 0   No facility-administered medications prior to visit.    Allergies  Allergen Reactions   Pylera [Bis Subcit-Metronid-Tetracyc]     Itching     ROS     Objective:    Physical Exam Constitutional:      General: He is not in acute distress.    Appearance: He is not ill-appearing.  HENT:     Mouth/Throat:     Mouth: Mucous membranes are moist.  Eyes:     Conjunctiva/sclera: Conjunctivae normal.  Cardiovascular:     Rate and Rhythm: Normal rate and regular rhythm.  Pulmonary:     Effort: Pulmonary effort is normal.     Breath sounds: Normal breath sounds.  Chest:     Chest wall: No tenderness.  Abdominal:     General: Bowel sounds are normal. There is no distension.     Palpations: Abdomen is soft.     Tenderness: There is no abdominal tenderness. There is no right CVA tenderness, left CVA tenderness, guarding or rebound. Negative signs include Murphy's sign and McBurney's sign.  Musculoskeletal:     Cervical back: Normal range of motion and neck supple.  Skin:    General: Skin is warm and dry.   Neurological:  General: No focal deficit present.     Mental Status: He is alert and oriented to person, place, and time.  Psychiatric:        Mood and Affect: Mood normal.        Behavior: Behavior normal.        Thought Content: Thought content normal.     BP 110/64 (BP Location: Left Arm, Patient Position: Sitting, Cuff Size: Large)   Pulse 75   Temp 97.8 F (36.6 C) (Temporal)   Ht '6\' 4"'$  (1.93 m)   Wt 178 lb (80.7 kg)   SpO2 99%   BMI 21.67 kg/m  Wt Readings from Last 3 Encounters:  02/03/22 178 lb (80.7 kg)  01/02/22 186 lb 6.4 oz (84.6 kg)  11/25/21 180 lb (81.6 kg)       Assessment & Plan:   Problem List Items Addressed This Visit       Respiratory   COPD (chronic obstructive pulmonary disease) ([place])   Relevant Orders   DG Chest 2 View     Digestive   Dysphagia   Relevant Orders   EKG 12-Lead   Lipase   Troponin I (High Sensitivity)   DG Chest 2 View   US Abdomen Limited RUQ (LIVER/GB)   GERD   Relevant Medications   pantoprazole (PROTONIX) 40 MG tablet   Other Relevant Orders   Lipase   Other Visit Diagnoses     Early satiety    -  Primary   Relevant Orders   Lipase   US Abdomen Limited RUQ (LIVER/GB)   Belching       Relevant Orders   Lipase   Troponin I (High Sensitivity)   US Abdomen Limited RUQ (LIVER/GB)   Intermittent right-sided chest pain       Relevant Orders   CBC with Differential/Platelet   Comprehensive metabolic panel   EKG 40-CXKG   Troponin I (High Sensitivity)   DG Chest 2 View   US Abdomen Limited RUQ (LIVER/GB)      Here today with complaints of early satiety, dysphagia, belching, decreased appetite and intermittent right sided chest pain.  Recently started back on acid reflux medications. He is not in any acute distress.  Currently is not having chest pain, palpitations or dyspnea. EKG shows NSR with a rate 66, no ST or T wave changes compared to prior EKG. Unlikely ACS.  Check troponin along with CBC, CMP and  chest x-ray. Ultrasound right upper quadrant ordered to look at gallbladder. Refilled pantoprazole. Upcoming colonoscopy scheduled on 02/07/2022 with Dr. Loletha Carrow.  I will forward this note to him to see if he would like to consider an EGD in combination with colonoscopy if he finds that appropriate.  I am having Neal C. Bangura maintain his multivitamin with minerals, tiZANidine, albuterol, hydrOXYzine, tadalafil, chlorproMAZINE, ondansetron, sucralfate, amLODipine, Eliquis, temazepam, tamsulosin, rosuvastatin, ezetimibe, and pantoprazole.  Meds ordered this encounter  Medications   pantoprazole (PROTONIX) 40 MG tablet    Sig: Take 1 tablet (40 mg total) by mouth daily.    Dispense:  90 tablet    Refill:  0    Order Specific Question:   Supervising Provider    Answer:   Pricilla Holm A [[id number]]

## 2022-02-03 NOTE — Patient Instructions (Signed)
Please go downstairs for labs and a chest x-ray before you leave today.  You will hear from [facility] imaging to schedule an ultrasound of your abdomen to look at your gallbladder.  I will reach out to Dr. Loletha Carrow who is doing your colonoscopy next week and let him know about your belching, acid reflux and issues swallowing.  Continue your acid reflux medication and avoid spicy foods, acidic foods or beverages and eat more of a bland diet for the next few days.

## 2022-02-05 ENCOUNTER — Other Ambulatory Visit: Payer: Self-pay | Admitting: Internal Medicine

## 2022-02-05 NOTE — Progress Notes (Signed)
Need follow up with Dr. Jenny Reichmann or nephrologist if he has one for slightly worse kidney function. He will need to call his GI office and they may not do his colonoscopy Tuesday since he has pneumonia.

## 2022-02-07 ENCOUNTER — Encounter: Payer: Self-pay | Admitting: Gastroenterology

## 2022-02-07 ENCOUNTER — Ambulatory Visit (AMBULATORY_SURGERY_CENTER): Payer: Medicare Other | Admitting: Gastroenterology

## 2022-02-07 VITALS — BP 124/70 | HR 53 | Temp 96.8°F | Resp 19 | Ht 76.0 in | Wt 186.0 lb

## 2022-02-07 DIAGNOSIS — Z86010 Personal history of colonic polyps: Secondary | ICD-10-CM

## 2022-02-07 DIAGNOSIS — D120 Benign neoplasm of cecum: Secondary | ICD-10-CM

## 2022-02-07 DIAGNOSIS — D128 Benign neoplasm of rectum: Secondary | ICD-10-CM

## 2022-02-07 DIAGNOSIS — Z09 Encounter for follow-up examination after completed treatment for conditions other than malignant neoplasm: Secondary | ICD-10-CM | POA: Diagnosis not present

## 2022-02-07 DIAGNOSIS — D125 Benign neoplasm of sigmoid colon: Secondary | ICD-10-CM

## 2022-02-07 MED ORDER — SODIUM CHLORIDE 0.9 % IV SOLN: 500.0000 mL | Freq: Once | INTRAVENOUS | Status: DC

## 2022-02-07 NOTE — Op Note (Signed)
[place] Patient Name: Charles [name] Procedure Date: 02/07/2022 8:42 AM MRN: [id number] Endoscopist: [place]. Loletha Carrow , MD, [id number] Age: 71 Referring MD:  Date of Birth: [birth year] Gender: Male Account #: [id number] Procedure:                Colonoscopy Indications:              Surveillance: Personal history of adenomatous                            polyps on last colonoscopy 3 years ago                           August 2020-tubular adenoma x 5 (1 of which was 12                            mm) -Dr. Ardis [name]                           May 2017 -6 subcentimeter tubular adenomas -Dr.                            Ardis [name] Medicines:                Monitored Anesthesia Care Procedure:                Pre-Anesthesia Assessment:                           - Prior to the procedure, a History and Physical                            was performed, and patient medications and                            allergies were reviewed. The patient's tolerance of                            previous anesthesia was also reviewed. The risks                            and benefits of the procedure and the sedation                            options and risks were discussed with the patient.                            All questions were answered, and informed consent                            was obtained. Prior Anticoagulants: The patient has                            taken Eliquis (apixaban), last dose was 2 days  prior to procedure. ASA Grade Assessment: III - A                            patient with severe systemic disease. After                            reviewing the risks and benefits, the patient was                            deemed in satisfactory condition to undergo the                            procedure.                           After obtaining informed consent, the colonoscope                            was passed under direct vision. Throughout the                             procedure, the patient's blood pressure, pulse, and                            oxygen saturations were monitored continuously. The                            CF [id number] #[id number] was introduced through the anus                            and advanced to the the terminal ileum, with                            identification of the appendiceal orifice and IC                            valve. The colonoscopy was performed without                            difficulty. The patient tolerated the procedure                            well. The quality of the bowel preparation was                            generally good, with some scattered fibrous debris                            that could not be completely cleared.. The terminal                            ileum, ileocecal valve, appendiceal orifice, and  rectum were photographed. The bowel preparation                            used was SUPREP via split dose instruction. Scope In: 8:52:34 AM Scope Out: 9:10:30 AM Scope Withdrawal Time: 0 hours 13 minutes 59 seconds  Total Procedure Duration: 0 hours 17 minutes 56 seconds  Findings:                 The perianal and digital rectal examinations were                            normal.                           The terminal ileum appeared normal.                           Repeat examination of right colon under NBI                            performed.                           Many diverticula were found in the entire colon.                           Two semi-sessile polyps were found in the proximal                            rectum and cecum. The polyps were diminutive in                            size. These polyps were removed with a cold snare.                            Resection and retrieval were complete.                           Internal hemorrhoids were found.                           The exam was otherwise without abnormality on                             direct and retroflexion views. Complications:            No immediate complications. Estimated Blood Loss:     Estimated blood loss was minimal. Impression:               - The examined portion of the ileum was normal.                           - Diverticulosis in the entire examined colon.                           - Two diminutive polyps in the proximal rectum and  in the cecum, removed with a cold snare. Resected                            and retrieved.                           - Internal hemorrhoids.                           - The examination was otherwise normal on direct                            and retroflexion views. Recommendation:           - Patient has a contact number available for                            emergencies. The signs and symptoms of potential                            delayed complications were discussed with the                            patient. Return to normal activities tomorrow.                            Written discharge instructions were provided to the                            patient.                           - Resume previous diet.                           - Continue present medications.                           - Await pathology results.                           - Repeat colonoscopy in 5 years for surveillance.                            (Consume more water with prep for next exam)                           - Resume Eliquis (apixaban) at prior dose today. Charles [name] L. Loletha Carrow, MD 02/07/2022 9:18:47 AM This report has been signed electronically.

## 2022-02-07 NOTE — Patient Instructions (Signed)
Please read handouts provided. Continue present medications. Await pathology results. Repeat colonoscopy in 5 years for screening. Resume Eliquis ( apixaban ) at prior dose today.   YOU HAD AN ENDOSCOPIC PROCEDURE TODAY AT [facility]:   Refer to the procedure report that was given to you for any specific questions about what was found during the examination.  If the procedure report does not answer your questions, please call your gastroenterologist to clarify.  If you requested that your care partner not be given the details of your procedure findings, then the procedure report has been included in a sealed envelope for you to review at your convenience later.  YOU SHOULD EXPECT: Some feelings of bloating in the abdomen. Passage of more gas than usual.  Walking can help get rid of the air that was put into your GI tract during the procedure and reduce the bloating. If you had a lower endoscopy (such as a colonoscopy or flexible sigmoidoscopy) you may notice spotting of blood in your stool or on the toilet paper. If you underwent a bowel prep for your procedure, you may not have a normal bowel movement for a few days.  Please Note:  You might notice some irritation and congestion in your nose or some drainage.  This is from the oxygen used during your procedure.  There is no need for concern and it should clear up in a day or so.  SYMPTOMS TO REPORT IMMEDIATELY:  Following lower endoscopy (colonoscopy or flexible sigmoidoscopy):  Excessive amounts of blood in the stool  Significant tenderness or worsening of abdominal pains  Swelling of the abdomen that is new, acute  Fever of 100F or higher   For urgent or emergent issues, a gastroenterologist can be reached at any hour by calling [phone]. Do not use MyChart messaging for urgent concerns.    DIET:  We do recommend a small meal at first, but then you may proceed to your regular diet.  Drink plenty of fluids but you  should avoid alcoholic beverages for 24 hours.  ACTIVITY:  You should plan to take it easy for the rest of today and you should NOT DRIVE or use heavy machinery until tomorrow (because of the sedation medicines used during the test).    FOLLOW UP: Our staff will call the number listed on your records the next business day following your procedure.  We will call around 7:15- 8:00 am to check on you and address any questions or concerns that you may have regarding the information given to you following your procedure. If we do not reach you, we will leave a message.     If any biopsies were taken you will be contacted by phone or by letter within the next 1-3 weeks.  Please call us at [phone] if you have not heard about the biopsies in 3 weeks.    SIGNATURES/CONFIDENTIALITY: You and/or your care partner have signed paperwork which will be entered into your electronic medical record.  These signatures attest to the fact that that the information above on your After Visit Summary has been reviewed and is understood.  Full responsibility of the confidentiality of this discharge information lies with you and/or your care-partner.

## 2022-02-07 NOTE — Progress Notes (Signed)
Pt resting comfortably. VSS. Airway intact. SBAR complete to RN. All questions answered.   

## 2022-02-07 NOTE — Progress Notes (Signed)
History and Physical:  This patient presents for endoscopic testing for: Encounter Diagnosis  Name Primary?   History of adenomatous polyp of colon Yes    71 year old man here for surveillance colonoscopy.  Last colonoscopy August 2020 with Dr. Philomena Doheny adenoma x 5, 1 of which was 12 mm. April 2017-6 subcentimeter tubular adenomas.  Patient is also had some recent upper digestive symptoms with early satiety nausea bloating and belching, and primary care has sent him for an abdominal ultrasound. He has a history of dysphagia and GI bleeding with inpatient upper endoscopies in 2019 and 2021 by Dr. Hilarie Fredrickson.  Patient describes some belching and bloating over the last week or so, and says her primary care last week they thought he might have a respiratory infection and put him on amoxicillin.  He was coughing and that has subsided, no production of sputum, no hemoptysis, breathing comfortably on room air with normal oxygen saturation.  He denies dysphagia or vomiting.   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   CHEST PAIN 12/24/2009   Coronary artery calcification seen on CT scan 06/01/2015   Cramp of limb 07/19/2007   DIVERTICULOSIS, COLON 03/07/2007   DVT (deep venous thrombosis) ([place]) 01/2014   LLE   Emphysema of lung ([place])    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/23/2006   FLANK PAIN, LEFT 02/25/2010   FREQUENCY, URINARY 12/24/2009   HEMORRHOIDS 10/23/2006   HYPERLIPIDEMIA 10/23/2006   HYPERTENSION 10/23/2006   PE (pulmonary embolism) 02/2014   PLANTAR FASCIITIS, LEFT 11/02/2008   Pneumonia 12/2013 X 2   PSA, INCREASED 11/20/2007   PULMONARY NODULE 05/14/2008   Unspecified Peripheral Vascular Disease 10/23/2006     Past Surgical History: Past Surgical History:  Procedure Laterality Date    BIOPSY  09/29/2019   Procedure: BIOPSY;  Surgeon: Jerene Bears, MD;  [facility];  Service: Endoscopy;;   BIOPSY  12/25/2019   Procedure: BIOPSY;  Surgeon: Milus Banister, MD;  [facility];  Service: Endoscopy;;   COLONOSCOPY     ESOPHAGOGASTRODUODENOSCOPY (EGD) WITH PROPOFOL N/A 06/15/2017   Procedure: ESOPHAGOGASTRODUODENOSCOPY (EGD) WITH PROPOFOL;  Surgeon: Jerene Bears, MD;  [facility];  Service: Gastroenterology;  Laterality: N/A;   ESOPHAGOGASTRODUODENOSCOPY (EGD) WITH PROPOFOL N/A 09/29/2019   Procedure: ESOPHAGOGASTRODUODENOSCOPY (EGD) WITH PROPOFOL;  Surgeon: Jerene Bears, MD;  [facility];  Service: Endoscopy;  Laterality: N/A;   ESOPHAGOGASTRODUODENOSCOPY (EGD) WITH PROPOFOL N/A 12/25/2019   Procedure: ESOPHAGOGASTRODUODENOSCOPY (EGD) WITH PROPOFOL;  Surgeon: Milus Banister, MD;  [facility];  Service: Endoscopy;  Laterality: N/A;   EUS N/A 12/25/2019   Procedure: UPPER ENDOSCOPIC ULTRASOUND (EUS) RADIAL;  Surgeon: Milus Banister, MD;  [facility];  Service: Endoscopy;  Laterality: N/A;   HEMORRHOID SURGERY  ?1990's   INGUINAL HERNIA REPAIR Bilateral ?2000's   IVC FILTER INSERTION N/A 10/03/2016   Procedure: IVC Filter Retrieveal;  Surgeon: Serafina Mitchell, MD;  Location: [facility];  Service: Cardiovascular;  Laterality: N/A;   MYRINGOTOMY WITH TUBE PLACEMENT Right 06/26/2018   SHOULDER ARTHROSCOPY W/ ROTATOR CUFF REPAIR Right 11/2013   STAPLING OF BLEBS Right 07/31/2016   Procedure: BLEBECTOMY;  Surgeon: Melrose Nakayama, MD;  Location: [place];  Service: Thoracic;  Laterality: Right;   VENA CAVA FILTER PLACEMENT Right 08/04/2016  Procedure: INSERTION VENA-CAVA FILTER;  Surgeon: Melrose Nakayama, MD;  Location: [place];  Service: Thoracic;  Laterality: Right;   VIDEO ASSISTED THORACOSCOPY Right 07/31/2016   Procedure: VIDEO ASSISTED THORACOSCOPY;  Surgeon: Melrose Nakayama, MD;   Location: [place];  Service: Thoracic;  Laterality: Right;   VIDEO ASSISTED THORACOSCOPY Right 08/04/2016   Procedure: REDO VIDEO ASSISTED THORACOSCOPY- EVACUATION OF HEMOTHORAX;  Surgeon: Melrose Nakayama, MD;  Location: [place];  Service: Thoracic;  Laterality: Right;   VIDEO BRONCHOSCOPY WITH ENDOBRONCHIAL NAVIGATION N/A 03/11/2014   Procedure: VIDEO BRONCHOSCOPY WITH ENDOBRONCHIAL NAVIGATION;  Surgeon: Collene Gobble, MD;  Location: [place];  Service: Thoracic;  Laterality: N/A;    Allergies: Allergies  Allergen Reactions   Pylera [Bis Subcit-Metronid-Tetracyc]     Itching     Outpatient Meds: Current Outpatient Medications  Medication Sig Dispense Refill   amLODipine (NORVASC) 10 MG tablet TAKE 1 TABLET DAILY 90 tablet 3   amoxicillin-clavulanate (AUGMENTIN) 500-125 MG tablet Take 1 tablet by mouth 3 (three) times daily. 21 tablet 0   ELIQUIS 2.5 MG TABS tablet TAKE 1 TABLET TWICE A DAY 180 tablet 3   ezetimibe (ZETIA) 10 MG tablet Take 1 tablet (10 mg total) by mouth daily. 30 tablet 11   pantoprazole (PROTONIX) 40 MG tablet Take 1 tablet (40 mg total) by mouth daily. 90 tablet 0   rosuvastatin (CRESTOR) 40 MG tablet TAKE 1 TABLET DAILY 90 tablet 3   sucralfate (CARAFATE) 1 g tablet Take 1 tablet (1 g total) by mouth 4 (four) times daily. 120 tablet 0   albuterol (VENTOLIN HFA) 108 (90 Base) MCG/ACT inhaler Inhale 2 puffs into the lungs every 6 (six) hours as needed for wheezing or shortness of breath. 8 g 5   chlorproMAZINE (THORAZINE) 10 MG tablet Take 1 tablet (10 mg total) by mouth 3 (three) times daily as needed for up to 10 doses. To help with hiccups 10 tablet 0   hydrOXYzine (ATARAX/VISTARIL) 10 MG tablet Take 1 tablet (10 mg total) by mouth 3 (three) times daily as needed for itching. 60 tablet 1   Multiple Vitamins-Minerals (MULTIVITAMIN WITH MINERALS) tablet Take 1 tablet by mouth daily. Men 50 +     ondansetron (ZOFRAN) 4 MG tablet Take 1 tablet (4 mg total) by mouth  every 6 (six) hours. 12 tablet 0   tadalafil (CIALIS) 20 MG tablet Take 1 tablet (20 mg total) by mouth daily as needed for erectile dysfunction. 6 tablet 8   tamsulosin (FLOMAX) 0.4 MG CAPS capsule Take 1 capsule (0.4 mg total) by mouth daily. (Patient not taking: Reported on 02/07/2022) 90 capsule 3   temazepam (RESTORIL) 15 MG capsule TAKE 1 CAPSULE BY MOUTH AT BEDTIME AS NEEDED 60 capsule 2   tiZANidine (ZANAFLEX) 2 MG tablet TAKE 1 TABLET BY MOUTH TWICE DAILY AS NEEDED FOR MUSCLE SPASM 60 tablet 2   Current Facility-Administered Medications  Medication Dose Route Frequency Provider Last Rate Last Admin   0.9 %  sodium chloride infusion  500 mL Intravenous Once Danis, Kirke Corin, MD          ___________________________________________________________________ Objective   Exam:  BP 125/69   Pulse 69   Temp (!) 96.8 F (36 C) (Temporal)   Ht '6\' 4"'$  (1.93 m)   Wt 186 lb (84.4 kg)   SpO2 99%   BMI 22.64 kg/m   CV: regular , S1/S2 Resp: clear to auscultation bilaterally, normal RR and effort noted GI: soft, no tenderness,  with active bowel sounds.  Data:  Normal abdominal ultrasound on 02/03/2022  Assessment: Encounter Diagnosis  Name Primary?   History of adenomatous polyp of colon Yes   This patient has also briefly been off his Eliquis for today's procedure.  Plan: Colonoscopy  The benefits and risks of the planned procedure were described in detail with the patient or (when appropriate) their health care proxy.  Risks were outlined as including, but not limited to, bleeding, infection, perforation, adverse medication reaction leading to cardiac or pulmonary decompensation, pancreatitis (if ERCP).  The limitation of incomplete mucosal visualization was also discussed.  No guarantees or warranties were given.    The patient is appropriate for an endoscopic procedure in the ambulatory setting.   - Wilfrid Lund, MD

## 2022-02-07 NOTE — Progress Notes (Signed)
Called to room to assist during endoscopic procedure.  Patient ID and intended procedure confirmed with present staff. Received instructions for my participation in the procedure from the performing physician.  

## 2022-02-08 ENCOUNTER — Other Ambulatory Visit: Payer: Self-pay | Admitting: Internal Medicine

## 2022-02-08 ENCOUNTER — Telehealth: Payer: Self-pay | Admitting: *Deleted

## 2022-02-08 NOTE — Telephone Encounter (Signed)
Attempted to call patient for their post-procedure follow-up call. No answer. Left voicemail.   

## 2022-02-16 ENCOUNTER — Other Ambulatory Visit: Payer: Self-pay | Admitting: Internal Medicine

## 2022-02-16 NOTE — Telephone Encounter (Signed)
Please refill as per office routine med refill policy (all routine meds to be refilled for 3 mo or monthly (per pt preference) up to one year from last visit, then month to month grace period for 3 mo, then further med refills will have to be denied) ? ?

## 2022-02-22 ENCOUNTER — Encounter: Payer: Self-pay | Admitting: Gastroenterology

## 2022-02-23 ENCOUNTER — Other Ambulatory Visit: Payer: Self-pay | Admitting: Internal Medicine

## 2022-03-12 ENCOUNTER — Other Ambulatory Visit: Payer: Self-pay | Admitting: Internal Medicine

## 2022-03-27 ENCOUNTER — Ambulatory Visit (INDEPENDENT_AMBULATORY_CARE_PROVIDER_SITE_OTHER): Payer: Medicare Other | Admitting: Internal Medicine

## 2022-03-27 ENCOUNTER — Other Ambulatory Visit: Payer: Self-pay | Admitting: Internal Medicine

## 2022-03-27 VITALS — BP 112/62 | HR 78 | Temp 97.9°F | Ht 76.0 in | Wt 178.0 lb

## 2022-03-27 DIAGNOSIS — E559 Vitamin D deficiency, unspecified: Secondary | ICD-10-CM | POA: Diagnosis not present

## 2022-03-27 DIAGNOSIS — N320 Bladder-neck obstruction: Secondary | ICD-10-CM

## 2022-03-27 DIAGNOSIS — K219 Gastro-esophageal reflux disease without esophagitis: Secondary | ICD-10-CM

## 2022-03-27 DIAGNOSIS — R1011 Right upper quadrant pain: Secondary | ICD-10-CM | POA: Insufficient documentation

## 2022-03-27 DIAGNOSIS — R739 Hyperglycemia, unspecified: Secondary | ICD-10-CM

## 2022-03-27 DIAGNOSIS — E7800 Pure hypercholesterolemia, unspecified: Secondary | ICD-10-CM

## 2022-03-27 DIAGNOSIS — N1831 Chronic kidney disease, stage 3a: Secondary | ICD-10-CM

## 2022-03-27 DIAGNOSIS — E538 Deficiency of other specified B group vitamins: Secondary | ICD-10-CM | POA: Diagnosis not present

## 2022-03-27 DIAGNOSIS — E611 Iron deficiency: Secondary | ICD-10-CM | POA: Diagnosis not present

## 2022-03-27 DIAGNOSIS — R972 Elevated prostate specific antigen [PSA]: Secondary | ICD-10-CM

## 2022-03-27 LAB — URINALYSIS, ROUTINE W REFLEX MICROSCOPIC
Bilirubin Urine: NEGATIVE
Hgb urine dipstick: NEGATIVE
Ketones, ur: NEGATIVE
Leukocytes,Ua: NEGATIVE
Nitrite: NEGATIVE
RBC / HPF: NONE SEEN (ref 0–?)
Specific Gravity, Urine: 1.015 (ref 1.000–1.030)
Total Protein, Urine: NEGATIVE
Urine Glucose: NEGATIVE
Urobilinogen, UA: 0.2 (ref 0.0–1.0)
pH: 6.5 (ref 5.0–8.0)

## 2022-03-27 LAB — CBC WITH DIFFERENTIAL/PLATELET
Basophils Absolute: 0.1 10*3/uL (ref 0.0–0.1)
Basophils Relative: 0.7 % (ref 0.0–3.0)
Eosinophils Absolute: 0.1 10*3/uL (ref 0.0–0.7)
Eosinophils Relative: 1.7 % (ref 0.0–5.0)
HCT: 37.1 % — ABNORMAL LOW (ref 39.0–52.0)
Hemoglobin: 12.4 g/dL — ABNORMAL LOW (ref 13.0–17.0)
Lymphocytes Relative: 26 % (ref 12.0–46.0)
Lymphs Abs: 1.9 10*3/uL (ref 0.7–4.0)
MCHC: 33.4 g/dL (ref 30.0–36.0)
MCV: 94.3 fl (ref 78.0–100.0)
Monocytes Absolute: 0.9 10*3/uL (ref 0.1–1.0)
Monocytes Relative: 12.5 % — ABNORMAL HIGH (ref 3.0–12.0)
Neutro Abs: 4.3 10*3/uL (ref 1.4–7.7)
Neutrophils Relative %: 59.1 % (ref 43.0–77.0)
Platelets: 157 10*3/uL (ref 150.0–400.0)
RBC: 3.93 Mil/uL — ABNORMAL LOW (ref 4.22–5.81)
RDW: 15.2 % (ref 11.5–15.5)
WBC: 7.4 10*3/uL (ref 4.0–10.5)

## 2022-03-27 LAB — HEPATIC FUNCTION PANEL
ALT: 13 U/L (ref 0–53)
AST: 16 U/L (ref 0–37)
Albumin: 3.6 g/dL (ref 3.5–5.2)
Alkaline Phosphatase: 35 U/L — ABNORMAL LOW (ref 39–117)
Bilirubin, Direct: 0.1 mg/dL (ref 0.0–0.3)
Total Bilirubin: 0.5 mg/dL (ref 0.2–1.2)
Total Protein: 6.5 g/dL (ref 6.0–8.3)

## 2022-03-27 LAB — LIPID PANEL
Cholesterol: 165 mg/dL (ref 0–200)
HDL: 61 mg/dL (ref >39.00)
LDL Cholesterol: 74 mg/dL (ref 0–99)
NonHDL: 103.7
Total CHOL/HDL Ratio: 3
Triglycerides: 148 mg/dL (ref 0.0–149.0)
VLDL: 29.6 mg/dL (ref 0.0–40.0)

## 2022-03-27 LAB — PSA: PSA: 4.74 ng/mL — ABNORMAL HIGH (ref 0.10–4.00)

## 2022-03-27 LAB — BASIC METABOLIC PANEL
BUN: 31 mg/dL — ABNORMAL HIGH (ref 6–23)
CO2: 23 mEq/L (ref 19–32)
Calcium: 8.5 mg/dL (ref 8.4–10.5)
Chloride: 109 mEq/L (ref 96–112)
Creatinine, Ser: 2.21 mg/dL — ABNORMAL HIGH (ref 0.40–1.50)
GFR: 29.64 mL/min — ABNORMAL LOW (ref >60.00)
Glucose, Bld: 70 mg/dL (ref 70–99)
Potassium: 3.9 mEq/L (ref 3.5–5.1)
Sodium: 142 mEq/L (ref 135–145)

## 2022-03-27 LAB — IBC PANEL
Iron: 64 ug/dL (ref 42–165)
Saturation Ratios: 22.6 % (ref 20.0–50.0)
TIBC: 282.8 ug/dL (ref 250.0–450.0)
Transferrin: 202 mg/dL — ABNORMAL LOW (ref 212.0–360.0)

## 2022-03-27 LAB — VITAMIN D 25 HYDROXY (VIT D DEFICIENCY, FRACTURES): VITD: 24.33 ng/mL — ABNORMAL LOW (ref 30.00–100.00)

## 2022-03-27 LAB — HEMOGLOBIN A1C: Hgb A1c MFr Bld: 4.8 % (ref 4.6–6.5)

## 2022-03-27 LAB — FERRITIN: Ferritin: 137.3 ng/mL (ref 22.0–322.0)

## 2022-03-27 LAB — TSH: TSH: 3.45 u[IU]/mL (ref 0.35–5.50)

## 2022-03-27 LAB — VITAMIN B12: Vitamin B-12: 359 pg/mL (ref 211–911)

## 2022-03-27 MED ORDER — PANTOPRAZOLE SODIUM 40 MG PO TBEC: 40.0000 mg | DELAYED_RELEASE_TABLET | Freq: Every day | ORAL | 3 refills | Status: DC

## 2022-03-27 MED ORDER — APIXABAN 2.5 MG PO TABS: 2.5000 mg | ORAL_TABLET | Freq: Two times a day (BID) | ORAL | 3 refills | Status: DC

## 2022-03-27 MED ORDER — EZETIMIBE 10 MG PO TABS: 10.0000 mg | ORAL_TABLET | Freq: Every day | ORAL | 3 refills | Status: DC

## 2022-03-27 MED ORDER — ROSUVASTATIN CALCIUM 40 MG PO TABS: 40.0000 mg | ORAL_TABLET | Freq: Every day | ORAL | 3 refills | Status: DC

## 2022-03-27 MED ORDER — TEMAZEPAM 15 MG PO CAPS: ORAL_CAPSULE | ORAL | 2 refills | Status: DC

## 2022-03-27 MED ORDER — AMLODIPINE BESYLATE 10 MG PO TABS: 10.0000 mg | ORAL_TABLET | Freq: Every day | ORAL | 3 refills | Status: DC

## 2022-03-27 NOTE — Progress Notes (Unsigned)
Patient ID: Charles [name], male   DOB: 1950/12/03, 72 y.o.   MRN: [id number]         Chief Complaint:: yearly exam and 20mofollow up (Discomfort on right side of abdomen)       HPI:  Charles BIASis a 72y.o. male here for yearly exam; overall doing ok, Pt denies chest pain, increased sob or doe, wheezing, orthopnea, PND, increased LE swelling, palpitations, dizziness or syncope.   Pt denies polydipsia, polyuria, or new focal neuro s/s.    Pt denies fever, wt loss, night sweats, loss of appetite, or other constitutional symptoms  Has had mil worsening reflux, but no dysphagia, n/v, bowel change or blood, but has had mild to mod worsening intermittent 2 mo RUQ pain with radiation at times to the right flank area.  Has been treated for recent right pulm pna, denies fever, cough,.  Has been out of PPI for 2 mo            Wt Readings from Last 3 Encounters:  03/27/22 178 lb (80.7 kg)  02/07/22 186 lb (84.4 kg)  02/03/22 178 lb (80.7 kg)   BP Readings from Last 3 Encounters:  03/27/22 112/62  02/07/22 124/70  02/03/22 110/64   Immunization History  Administered Date(s) Administered   Moderna Sars-Covid-2 Vaccination 04/14/2019, 05/13/2019, 02/09/2020   PNEUMOCOCCAL CONJUGATE-20 09/08/2020   Td 02/20/1998, 11/02/2008   Health Maintenance Due  Topic Date Due   DTaP/Tdap/Td (3 - Tdap) 11/03/2018      Past Medical History:  Diagnosis Date   ABNORMAL ELECTROCARDIOGRAM 11/02/2008   ABSCESS, LUNG 10/23/2006   Anemia 05/26/2014   BACTERIAL PNEUMONIA 12/28/2009   BPH (benign prostatic hyperplasia) 11/22/2010   Cerebellar stroke ([place] 05/26/2014   CHEST PAIN 12/24/2009   Coronary artery calcification seen on CT scan 06/01/2015   Cramp of limb 07/19/2007   DIVERTICULOSIS, COLON 03/07/2007   [place] 01/2014   LLE   [place]    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/23/2006   FLANK PAIN, LEFT 02/25/2010   FREQUENCY, URINARY 12/24/2009   HEMORRHOIDS 10/23/2006   HYPERLIPIDEMIA  10/23/2006   HYPERTENSION 10/23/2006   PE (pulmonary embolism) 02/2014   PLANTAR FASCIITIS, LEFT 11/02/2008   Pneumonia 12/2013 X 2   PSA, INCREASED 11/20/2007   PULMONARY NODULE 05/14/2008   Unspecified Peripheral Vascular Disease 10/23/2006   Past Surgical History:  Procedure Laterality Date   BIOPSY  09/29/2019   Procedure: BIOPSY;  Surgeon: PJerene Bears MD;  [facility];  Service: Endoscopy;;   BIOPSY  12/25/2019   Procedure: BIOPSY;  Surgeon: JMilus Banister MD;  [facility];  Service: Endoscopy;;   COLONOSCOPY     ESOPHAGOGASTRODUODENOSCOPY (EGD) WITH PROPOFOL N/A 06/15/2017   Procedure: ESOPHAGOGASTRODUODENOSCOPY (EGD) WITH PROPOFOL;  Surgeon: PJerene Bears MD;  [facility];  Service: Gastroenterology;  Laterality: N/A;   ESOPHAGOGASTRODUODENOSCOPY (EGD) WITH PROPOFOL N/A 09/29/2019   Procedure: ESOPHAGOGASTRODUODENOSCOPY (EGD) WITH PROPOFOL;  Surgeon: PJerene Bears MD;  [facility];  Service: Endoscopy;  Laterality: N/A;   ESOPHAGOGASTRODUODENOSCOPY (EGD) WITH PROPOFOL N/A 12/25/2019   Procedure: ESOPHAGOGASTRODUODENOSCOPY (EGD) WITH PROPOFOL;  Surgeon: JMilus Banister MD;  [facility];  Service: Endoscopy;  Laterality: N/A;   EUS N/A 12/25/2019   Procedure: UPPER ENDOSCOPIC ULTRASOUND (EUS) RADIAL;  Surgeon: JMilus Banister MD;  [facility];  Service: Endoscopy;  Laterality: N/A;   HEMORRHOID SURGERY  ?1990's   INGUINAL HERNIA REPAIR Bilateral ?  2000's   IVC FILTER INSERTION N/A 10/03/2016   Procedure: IVC Filter Retrieveal;  Surgeon: Serafina Mitchell, MD;  Location: [facility];  Service: Cardiovascular;  Laterality: N/A;   MYRINGOTOMY WITH TUBE PLACEMENT Right 06/26/2018   SHOULDER ARTHROSCOPY W/ ROTATOR CUFF REPAIR Right 11/2013   STAPLING OF BLEBS Right 07/31/2016   Procedure: BLEBECTOMY;  Surgeon: Melrose Nakayama, MD;  Location: [place];  Service: Thoracic;  Laterality: Right;   VENA CAVA FILTER  PLACEMENT Right 08/04/2016   Procedure: INSERTION VENA-CAVA FILTER;  Surgeon: Melrose Nakayama, MD;  Location: [place];  Service: Thoracic;  Laterality: Right;   VIDEO ASSISTED THORACOSCOPY Right 07/31/2016   Procedure: VIDEO ASSISTED THORACOSCOPY;  Surgeon: Melrose Nakayama, MD;  Location: [place];  Service: Thoracic;  Laterality: Right;   VIDEO ASSISTED THORACOSCOPY Right 08/04/2016   Procedure: REDO VIDEO ASSISTED THORACOSCOPY- EVACUATION OF HEMOTHORAX;  Surgeon: Melrose Nakayama, MD;  Location: [place];  Service: Thoracic;  Laterality: Right;   VIDEO BRONCHOSCOPY WITH ENDOBRONCHIAL NAVIGATION N/A 03/11/2014   Procedure: VIDEO BRONCHOSCOPY WITH ENDOBRONCHIAL NAVIGATION;  Surgeon: Collene Gobble, MD;  Location: [place];  Service: Thoracic;  Laterality: N/A;    reports that he has quit smoking. His smoking use included cigarettes. He has a 15.00 pack-year smoking history. He has never used smokeless tobacco. He reports current alcohol use. He reports current drug use. Drug: Marijuana. family history includes Heart disease in his father and mother; Kidney disease in his sister. Allergies  Allergen Reactions   Pylera [Bis Subcit-Metronid-Tetracyc]     Itching    Current Outpatient Medications on File Prior to Visit  Medication Sig Dispense Refill   albuterol (VENTOLIN HFA) 108 (90 Base) MCG/ACT inhaler Inhale 2 puffs into the lungs every 6 (six) hours as needed for wheezing or shortness of breath. 8 g 5   chlorproMAZINE (THORAZINE) 10 MG tablet Take 1 tablet (10 mg total) by mouth 3 (three) times daily as needed for up to 10 doses. To help with hiccups 10 tablet 0   hydrOXYzine (ATARAX/VISTARIL) 10 MG tablet Take 1 tablet (10 mg total) by mouth 3 (three) times daily as needed for itching. 60 tablet 1   Multiple Vitamins-Minerals (MULTIVITAMIN WITH MINERALS) tablet Take 1 tablet by mouth daily. Men 50 +     ondansetron (ZOFRAN) 4 MG tablet Take 1 tablet (4 mg total) by mouth every 6 (six)  hours. 12 tablet 0   sucralfate (CARAFATE) 1 g tablet TAKE 1 TABLET BY MOUTH 4 TIMES DAILY. 120 tablet 0   tadalafil (CIALIS) 20 MG tablet Take 1 tablet (20 mg total) by mouth daily as needed for erectile dysfunction. 6 tablet 8   tiZANidine (ZANAFLEX) 2 MG tablet TAKE 1 TABLET BY MOUTH TWICE DAILY AS NEEDED FOR MUSCLE SPASM 60 tablet 2   tamsulosin (FLOMAX) 0.4 MG CAPS capsule Take 1 capsule (0.4 mg total) by mouth daily. (Patient not taking: Reported on 02/07/2022) 90 capsule 3   No current facility-administered medications on file prior to visit.        ROS:  All others reviewed and negative.  Objective        PE:  BP 112/62 (BP Location: Right Arm, Patient Position: Sitting, Cuff Size: Normal)   Pulse 78   Temp 97.9 F (36.6 C) (Oral)   Ht '6\' 4"'$  (1.93 m)   Wt 178 lb (80.7 kg)   SpO2 95%   BMI 21.67 kg/m  Constitutional: Pt appears in NAD               HENT: Head: NCAT.                Right Ear: External ear normal.                 Left Ear: External ear normal.                Eyes: . Pupils are equal, round, and reactive to light. Conjunctivae and EOM are normal               Nose: without d/c or deformity               Neck: Neck supple. Gross normal ROM               Cardiovascular: Normal rate and regular rhythm.                 Pulmonary/Chest: Effort normal and breath sounds without rales or wheezing.                Abd:  Soft, NT, ND, + BS, no organomegaly               Neurological: Pt is alert. At baseline orientation, motor grossly intact               Skin: Skin is warm. No rashes, no other new lesions, LE edema - none               Psychiatric: Pt behavior is normal without agitation   Micro: none  Cardiac tracings I have personally interpreted today:  none  Pertinent Radiological findings (summarize): none   Lab Results  Component Value Date   WBC 7.4 03/27/2022   HGB 12.4 (L) 03/27/2022   HCT 37.1 (L) 03/27/2022   PLT 157.0 03/27/2022    GLUCOSE 70 03/27/2022   CHOL 165 03/27/2022   TRIG 148.0 03/27/2022   HDL 61.00 03/27/2022   LDLDIRECT 103.0 07/07/2021   LDLCALC 74 03/27/2022   ALT 13 03/27/2022   AST 16 03/27/2022   NA 142 03/27/2022   K 3.9 03/27/2022   CL 109 03/27/2022   CREATININE 2.21 (H) 03/27/2022   BUN 31 (H) 03/27/2022   CO2 23 03/27/2022   TSH 3.45 03/27/2022   PSA 4.74 (H) 03/27/2022   INR 1.2 09/29/2019   HGBA1C 4.8 03/27/2022   MICROALBUR 1.5 08/25/2015   Assessment/Plan:  Charles [name] is a 72 y.o. Black or African American [2] male with  has a past medical history of ABNORMAL ELECTROCARDIOGRAM (11/02/2008), ABSCESS, LUNG (10/23/2006), Anemia (05/26/2014), BACTERIAL PNEUMONIA (12/28/2009), BPH (benign prostatic hyperplasia) (11/22/2010), Cerebellar stroke ([place]) (05/26/2014), CHEST PAIN (12/24/2009), Coronary artery calcification seen on CT scan (06/01/2015), Cramp of limb (07/19/2007), DIVERTICULOSIS, COLON (03/07/2007), DVT (deep venous thrombosis) ([place]) (01/2014), Emphysema of lung ([place]), ERECTILE DYSFUNCTION (10/23/2006), FLANK PAIN, LEFT (02/25/2010), FREQUENCY, URINARY (12/24/2009), HEMORRHOIDS (10/23/2006), HYPERLIPIDEMIA (10/23/2006), HYPERTENSION (10/23/2006), PE (pulmonary embolism) (02/2014), PLANTAR FASCIITIS, LEFT (11/02/2008), Pneumonia (12/2013 X 2), PSA, INCREASED (11/20/2007), PULMONARY NODULE (05/14/2008), and Unspecified Peripheral Vascular Disease (10/23/2006).  Vitamin D deficiency Last vitamin D Lab Results  Component Value Date   VD25OH 24.33 (L) 03/27/2022   Low, to start oral replacement   Hyperlipidemia Lab Results  Component Value Date   LDLCALC 74 03/27/2022   Mild uncontrolled, pt to continue current statin crestor 40 mg qd, and zetia 10 mg qd and lower chol diet, delcines  other change for now   Hyperglycemia Lab Results  Component Value Date   HGBA1C 4.8 03/27/2022   Stable, pt to continue current medical treatment  - diet, wt control   GERD Mild worsening, ok to restart protonix 40  qd  CKD (chronic kidney disease), stage III Lab Results  Component Value Date   CREATININE 2.21 (H) 03/27/2022   Stable overall, cont to avoid nephrotoxins  RUQ pain Etiology unclear, to restart PPI as above, for LFTs with labs today, and CXR in f/u pna  Followup: Return in about 6 months (around 09/25/2022).  Cathlean Cower, MD 03/29/2022 8:58 PM [facility]

## 2022-03-27 NOTE — Patient Instructions (Addendum)
Please consider having the Pneumovax soon here at the office  Ok to restart the protonix 40 mg per day  Please continue all other medications as before, and refills have been done if requested.  Please have the pharmacy call with any other refills you may need.  Please continue your efforts at being more active, low cholesterol diet, and weight control.  You are otherwise up to date with prevention measures today.  Please keep your appointments with your specialists as you may have planned  Please let us know if you would want to have the Chest xray repeated; we probably dont need the ultrasound repeat today   Please go to the LAB at the blood drawing area for the tests to be done  You will be contacted by phone if any changes need to be made immediately.  Otherwise, you will receive a letter about your results with an explanation, but please check with MyChart first.  Please remember to sign up for MyChart if you have not done so, as this will be important to you in the future with finding out test results, communicating by private email, and scheduling acute appointments online when needed.  Please make an Appointment to return in 6 months, or sooner if needed

## 2022-03-29 ENCOUNTER — Encounter: Payer: Self-pay | Admitting: Internal Medicine

## 2022-03-29 NOTE — Assessment & Plan Note (Signed)
Etiology unclear, to restart PPI as above, for LFTs with labs today, and CXR in f/u pna

## 2022-03-29 NOTE — Assessment & Plan Note (Signed)
Last vitamin D Lab Results  Component Value Date   VD25OH 24.33 (L) 03/27/2022   Low, to start oral replacement

## 2022-03-29 NOTE — Assessment & Plan Note (Signed)
Mild worsening, ok to restart protonix 40 qd

## 2022-03-29 NOTE — Assessment & Plan Note (Signed)
Lab Results  Component Value Date   HGBA1C 4.8 03/27/2022   Stable, pt to continue current medical treatment  - diet, wt control

## 2022-03-29 NOTE — Assessment & Plan Note (Signed)
Lab Results  Component Value Date   LDLCALC 74 03/27/2022   Mild uncontrolled, pt to continue current statin crestor 40 mg qd, and zetia 10 mg qd and lower chol diet, delcines other change for now

## 2022-03-29 NOTE — Assessment & Plan Note (Signed)
Lab Results  Component Value Date   CREATININE 2.21 (H) 03/27/2022   Stable overall, cont to avoid nephrotoxins

## 2022-07-14 ENCOUNTER — Other Ambulatory Visit: Payer: Self-pay | Admitting: Internal Medicine

## 2022-07-14 ENCOUNTER — Other Ambulatory Visit: Payer: Self-pay

## 2022-09-21 ENCOUNTER — Ambulatory Visit: Payer: Medicare Other | Admitting: Internal Medicine

## 2022-09-21 ENCOUNTER — Encounter: Payer: Self-pay | Admitting: Internal Medicine

## 2022-09-21 VITALS — BP 100/60 | HR 68 | Temp 97.9°F | Ht 76.0 in | Wt 182.0 lb

## 2022-09-21 DIAGNOSIS — H905 Unspecified sensorineural hearing loss: Secondary | ICD-10-CM | POA: Diagnosis not present

## 2022-09-21 DIAGNOSIS — H9201 Otalgia, right ear: Secondary | ICD-10-CM | POA: Diagnosis not present

## 2022-09-21 DIAGNOSIS — I639 Cerebral infarction, unspecified: Secondary | ICD-10-CM | POA: Diagnosis not present

## 2022-09-21 DIAGNOSIS — R270 Ataxia, unspecified: Secondary | ICD-10-CM

## 2022-09-21 DIAGNOSIS — R972 Elevated prostate specific antigen [PSA]: Secondary | ICD-10-CM

## 2022-09-21 DIAGNOSIS — J301 Allergic rhinitis due to pollen: Secondary | ICD-10-CM | POA: Diagnosis not present

## 2022-09-21 DIAGNOSIS — I10 Essential (primary) hypertension: Secondary | ICD-10-CM | POA: Diagnosis not present

## 2022-09-21 MED ORDER — FEXOFENADINE HCL 180 MG PO TABS: 180.0000 mg | ORAL_TABLET | Freq: Every day | ORAL | 2 refills | Status: AC

## 2022-09-21 MED ORDER — TRIAMCINOLONE ACETONIDE 55 MCG/ACT NA AERO: 2.0000 | INHALATION_SPRAY | Freq: Every day | NASAL | 12 refills | Status: AC

## 2022-09-21 MED ORDER — AMLODIPINE BESYLATE 5 MG PO TABS: 5.0000 mg | ORAL_TABLET | Freq: Every day | ORAL | 3 refills | Status: DC

## 2022-09-21 MED ORDER — GUAIFENESIN ER 600 MG PO TB12: 1200.0000 mg | ORAL_TABLET | Freq: Two times a day (BID) | ORAL | 1 refills | Status: AC | PRN

## 2022-09-21 MED ORDER — AZITHROMYCIN 250 MG PO TABS: ORAL_TABLET | ORAL | 1 refills | Status: AC

## 2022-09-21 NOTE — Patient Instructions (Addendum)
Please take all new medication as prescribed - the antibiotic, allegra, nasacort and mucinex for allergies and right ear  Ok to decrease the amlodipine to 5 mg per day  [facility] for the hearing aid on the right  Please continue all other medications as before, and refills have been done if requested.  Please have the pharmacy call with any other refills you may need.  Please continue your efforts at being more active, low cholesterol diet, and weight control.  You are otherwise up to date with prevention measures today.  Please keep your appointments with your specialists as you may have planned - urology for the elevated PSA  You will be contacted regarding the referral for: ENT and MRI for the brain  No further lab work needed today  Ok to CHANGE THE Sep 25 2022 appt to 6 months, or sooner if needed

## 2022-09-21 NOTE — Progress Notes (Signed)
Patient ID: Charles [name], male   DOB: 06/11/1950, 72 y.o.   MRN: [id number]        Chief Complaint: follow up weakness and ataxia, htn, right hearing loss and pain, allergies, elev psa       HPI:  Charles [name] is a 72 y.o. male here with c/o worsening ambulatory difficulty in the past 2 wks with staggery at times to left and right, no recent ETOH use or HA, No specific other pain or neuro complaints.  Pt denies chest pain, increased sob or doe, wheezing, orthopnea, PND, increased LE swelling, palpitations, dizziness or syncope. Pt denies polydipsia, polyuria, or new focal neuro s/s.   Denies urinary symptoms such as dysuria, frequency, urgency, flank pain, hematuria or n/v, fever, chills.  Has seen urology with improved psa, and asked to f/u at 1 yr there.  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  Does have several wks ongoing nasal allergy symptoms with clearish congestion, itch and sneezing, without fever, pain, ST, cough, swelling or wheezing, but does have right ear pain and hearing loss as well.  BP has overall been low normal , even low recently.         Wt Readings from Last 3 Encounters:  09/21/22 182 lb (82.6 kg)  03/27/22 178 lb (80.7 kg)  02/07/22 186 lb (84.4 kg)   BP Readings from Last 3 Encounters:  09/21/22 100/60  03/27/22 112/62  02/07/22 124/70         Past Medical History:  Diagnosis Date   ABNORMAL ELECTROCARDIOGRAM 11/02/2008   ABSCESS, LUNG 10/23/2006   Anemia 05/26/2014   BACTERIAL PNEUMONIA 12/28/2009   BPH (benign prostatic hyperplasia) 11/22/2010   Cerebellar stroke (HCC) 05/26/2014   CHEST PAIN 12/24/2009   Coronary artery calcification seen on CT scan 06/01/2015   Cramp of limb 07/19/2007   DIVERTICULOSIS, COLON 03/07/2007   DVT (deep venous thrombosis) (HCC) 01/2014   LLE   Emphysema of lung (HCC)    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/23/2006   FLANK PAIN, LEFT 02/25/2010   FREQUENCY, URINARY 12/24/2009    HEMORRHOIDS 10/23/2006   HYPERLIPIDEMIA 10/23/2006   HYPERTENSION 10/23/2006   PE (pulmonary embolism) 02/2014   PLANTAR FASCIITIS, LEFT 11/02/2008   Pneumonia 12/2013 X 2   PSA, INCREASED 11/20/2007   PULMONARY NODULE 05/14/2008   Unspecified Peripheral Vascular Disease 10/23/2006   Past Surgical History:  Procedure Laterality Date   BIOPSY  09/29/2019   Procedure: BIOPSY;  Surgeon: Beverley Fiedler, MD;  Location: [facility];  Service: Endoscopy;;   BIOPSY  12/25/2019   Procedure: BIOPSY;  Surgeon: Rachael Fee, MD;  Location: [facility];  Service: Endoscopy;;   COLONOSCOPY     ESOPHAGOGASTRODUODENOSCOPY (EGD) WITH PROPOFOL N/A 06/15/2017   Procedure: ESOPHAGOGASTRODUODENOSCOPY (EGD) WITH PROPOFOL;  Surgeon: Beverley Fiedler, MD;  Location: [facility];  Service: Gastroenterology;  Laterality: N/A;   ESOPHAGOGASTRODUODENOSCOPY (EGD) WITH PROPOFOL N/A 09/29/2019   Procedure: ESOPHAGOGASTRODUODENOSCOPY (EGD) WITH PROPOFOL;  Surgeon: Beverley Fiedler, MD;  Location: [facility];  Service: Endoscopy;  Laterality: N/A;   ESOPHAGOGASTRODUODENOSCOPY (EGD) WITH PROPOFOL N/A 12/25/2019   Procedure: ESOPHAGOGASTRODUODENOSCOPY (EGD) WITH PROPOFOL;  Surgeon: Rachael Fee, MD;  Location: [facility];  Service: Endoscopy;  Laterality: N/A;   EUS N/A 12/25/2019   Procedure: UPPER ENDOSCOPIC ULTRASOUND (EUS) RADIAL;  Surgeon: Rachael Fee, MD;  Location: [facility];  Service: Endoscopy;  Laterality: N/A;  HEMORRHOID SURGERY  ?1990's   INGUINAL HERNIA REPAIR Bilateral ?2000's   IVC FILTER INSERTION N/A 10/03/2016   Procedure: IVC Filter Retrieveal;  Surgeon: Nada Libman, MD;  Location: [facility];  Service: Cardiovascular;  Laterality: N/A;   MYRINGOTOMY WITH TUBE PLACEMENT Right 06/26/2018   SHOULDER ARTHROSCOPY W/ ROTATOR CUFF REPAIR Right 11/2013   STAPLING OF BLEBS Right 07/31/2016   Procedure: BLEBECTOMY;  Surgeon: Loreli Slot, MD;  Location: [facility] OR;  Service:  Thoracic;  Laterality: Right;   VENA CAVA FILTER PLACEMENT Right 08/04/2016   Procedure: INSERTION VENA-CAVA FILTER;  Surgeon: Loreli Slot, MD;  Location: [facility] OR;  Service: Thoracic;  Laterality: Right;   VIDEO ASSISTED THORACOSCOPY Right 07/31/2016   Procedure: VIDEO ASSISTED THORACOSCOPY;  Surgeon: Loreli Slot, MD;  Location: [facility] OR;  Service: Thoracic;  Laterality: Right;   VIDEO ASSISTED THORACOSCOPY Right 08/04/2016   Procedure: REDO VIDEO ASSISTED THORACOSCOPY- EVACUATION OF HEMOTHORAX;  Surgeon: Loreli Slot, MD;  Location: [facility] OR;  Service: Thoracic;  Laterality: Right;   VIDEO BRONCHOSCOPY WITH ENDOBRONCHIAL NAVIGATION N/A 03/11/2014   Procedure: VIDEO BRONCHOSCOPY WITH ENDOBRONCHIAL NAVIGATION;  Surgeon: Leslye Peer, MD;  Location: [facility] OR;  Service: Thoracic;  Laterality: N/A;    reports that he has quit smoking. His smoking use included cigarettes. He has a 15 pack-year smoking history. He has never used smokeless tobacco. He reports current alcohol use. He reports current drug use. Drug: Marijuana. family history includes Heart disease in his father and mother; Kidney disease in his sister. Allergies  Allergen Reactions   Pylera [Bis Subcit-Metronid-Tetracyc]     Itching    Current Outpatient Medications on File Prior to Visit  Medication Sig Dispense Refill   albuterol (VENTOLIN HFA) 108 (90 Base) MCG/ACT inhaler Inhale 2 puffs into the lungs every 6 (six) hours as needed for wheezing or shortness of breath. 8 g 5   apixaban (ELIQUIS) 2.5 MG TABS tablet Take 1 tablet (2.5 mg total) by mouth 2 (two) times daily. 180 tablet 3   chlorproMAZINE (THORAZINE) 10 MG tablet Take 1 tablet (10 mg total) by mouth 3 (three) times daily as needed for up to 10 doses. To help with hiccups 10 tablet 0   ezetimibe (ZETIA) 10 MG tablet Take 1 tablet (10 mg total) by mouth daily. 90 tablet 3   hydrOXYzine (ATARAX/VISTARIL) 10 MG tablet Take 1 tablet (10 mg total) by mouth  3 (three) times daily as needed for itching. 60 tablet 1   Multiple Vitamins-Minerals (MULTIVITAMIN WITH MINERALS) tablet Take 1 tablet by mouth daily. Men 50 +     ondansetron (ZOFRAN) 4 MG tablet Take 1 tablet (4 mg total) by mouth every 6 (six) hours. 12 tablet 0   pantoprazole (PROTONIX) 40 MG tablet Take 1 tablet (40 mg total) by mouth daily. 90 tablet 3   rosuvastatin (CRESTOR) 40 MG tablet Take 1 tablet (40 mg total) by mouth daily. 90 tablet 3   sucralfate (CARAFATE) 1 g tablet TAKE 1 TABLET BY MOUTH FOUR TIMES A DAY 120 tablet 0   tadalafil (CIALIS) 20 MG tablet TAKE 1 TABLET BY MOUTH EVERY DAY AS NEEDED FOR ERECTILE DYSFUNCTION 6 tablet 8   tamsulosin (FLOMAX) 0.4 MG CAPS capsule Take 1 capsule (0.4 mg total) by mouth daily. 90 capsule 3   temazepam (RESTORIL) 15 MG capsule TAKE 1 CAPSULE BY MOUTH AT BEDTIME AS NEEDED 60 capsule 2   tiZANidine (ZANAFLEX) 2 MG tablet TAKE 1 TABLET BY  MOUTH TWICE DAILY AS NEEDED FOR MUSCLE SPASM 60 tablet 2   No current facility-administered medications on file prior to visit.        ROS:  All others reviewed and negative.  Objective        PE:  BP 100/60 (BP Location: Left Arm, Patient Position: Sitting, Cuff Size: Normal)   Pulse 68   Temp 97.9 F (36.6 C) (Oral)   Ht 6\' 4"  (1.93 m)   Wt 182 lb (82.6 kg)   SpO2 94%   BMI 22.15 kg/m                 Constitutional: Pt appears in NAD, but fatigued, somewhat ataxic               HENT: Head: NCAT.                Right Ear: External ear normal.  Right TM mod erythema and bulging;  Max sinus areas non tender.  Pharynx with mild erythema, no exudate               Left Ear: External ear normal.                Eyes: . Pupils are equal, round, and reactive to light. Conjunctivae and EOM are normal               Nose: without d/c or deformity               Neck: Neck supple. Gross normal ROM               Cardiovascular: Normal rate and regular rhythm.                 Pulmonary/Chest: Effort normal  and breath sounds without rales or wheezing.                Abd:  Soft, NT, ND, + BS, no organomegaly               Neurological: Pt is alert. At baseline orientation, motor grossly intact               Skin: Skin is warm. No rashes, no other new lesions, LE edema - none               Psychiatric: Pt behavior is normal without agitation   Micro: none  Cardiac tracings I have personally interpreted today:  none  Pertinent Radiological findings (summarize): none   Lab Results  Component Value Date   WBC 7.4 03/27/2022   HGB 12.4 (L) 03/27/2022   HCT 37.1 (L) 03/27/2022   PLT 157.0 03/27/2022   GLUCOSE 70 03/27/2022   CHOL 165 03/27/2022   TRIG 148.0 03/27/2022   HDL 61.00 03/27/2022   LDLDIRECT 103.0 07/07/2021   LDLCALC 74 03/27/2022   ALT 13 03/27/2022   AST 16 03/27/2022   NA 142 03/27/2022   K 3.9 03/27/2022   CL 109 03/27/2022   CREATININE 2.21 (H) 03/27/2022   BUN 31 (H) 03/27/2022   CO2 23 03/27/2022   TSH 3.45 03/27/2022   PSA 4.74 (H) 03/27/2022   INR 1.2 09/29/2019   HGBA1C 4.8 03/27/2022   MICROALBUR 1.5 08/25/2015   Assessment/Plan:  Charles [name] is a 72 y.o. Black or African American [2] male with  has a past medical history of ABNORMAL ELECTROCARDIOGRAM (11/02/2008), ABSCESS, LUNG (10/23/2006), Anemia (05/26/2014), BACTERIAL PNEUMONIA (12/28/2009), BPH (benign prostatic hyperplasia) (11/22/2010), Cerebellar stroke (HCC) (  05/26/2014), CHEST PAIN (12/24/2009), Coronary artery calcification seen on CT scan (06/01/2015), Cramp of limb (07/19/2007), DIVERTICULOSIS, COLON (03/07/2007), DVT (deep venous thrombosis) (HCC) (01/2014), Emphysema of lung (HCC), ERECTILE DYSFUNCTION (10/23/2006), FLANK PAIN, LEFT (02/25/2010), FREQUENCY, URINARY (12/24/2009), HEMORRHOIDS (10/23/2006), HYPERLIPIDEMIA (10/23/2006), HYPERTENSION (10/23/2006), PE (pulmonary embolism) (02/2014), PLANTAR FASCIITIS, LEFT (11/02/2008), Pneumonia (12/2013 X 2), PSA, INCREASED (11/20/2007), PULMONARY NODULE (05/14/2008), and  Unspecified Peripheral Vascular Disease (10/23/2006).  Cerebellar stroke With hx of right cerebellar stroke, cont eliquis, zetia and for MRI r/o worsening  Hearing loss on right Also for referral ENT  Elevated PSA Lab Results  Component Value Date   PSA 4.74 (H) 03/27/2022   PSA 2.27 07/07/2021   PSA 3.02 09/08/2020   Now after recent f/u with urology improved,  to f/u any worsening symptoms or concerns  Allergic rhinitis Mild to mod, for allegra nasacort asd,  to f/u any worsening symptoms or concerns  Essential hypertension Possibly overcontrolled, ok for reduced amlodipine to 5 mg qd  Right ear pain Mild to mod, for antibx course zpack, mucinex bid,  to f/u any worsening symptoms or concerns  Followup: No follow-ups on file.  Oliver Barre, MD 09/24/2022 1:16 PM [facility]

## 2022-09-23 ENCOUNTER — Ambulatory Visit
Admission: RE | Admit: 2022-09-23 | Discharge: 2022-09-23 | Disposition: A | Discharge status: Home / Self Care | Payer: Medicare Other | Source: Ambulatory Visit | Attending: Internal Medicine | Admitting: Internal Medicine

## 2022-09-23 DIAGNOSIS — I639 Cerebral infarction, unspecified: Secondary | ICD-10-CM

## 2022-09-23 DIAGNOSIS — H905 Unspecified sensorineural hearing loss: Secondary | ICD-10-CM

## 2022-09-23 DIAGNOSIS — R270 Ataxia, unspecified: Secondary | ICD-10-CM

## 2022-09-24 DIAGNOSIS — R972 Elevated prostate specific antigen [PSA]: Secondary | ICD-10-CM | POA: Insufficient documentation

## 2022-09-24 DIAGNOSIS — H9201 Otalgia, right ear: Secondary | ICD-10-CM | POA: Insufficient documentation

## 2022-09-24 NOTE — Assessment & Plan Note (Signed)
Mild to mod, for antibx course zpack, mucinex bid,  to f/u any worsening symptoms or concerns

## 2022-09-24 NOTE — Assessment & Plan Note (Signed)
Lab Results  Component Value Date   PSA 4.74 (H) 03/27/2022   PSA 2.27 07/07/2021   PSA 3.02 09/08/2020   Now after recent f/u with urology improved,  to f/u any worsening symptoms or concerns

## 2022-09-24 NOTE — Assessment & Plan Note (Signed)
Mild to mod, for allegra/nasacort asd,  to f/u any worsening symptoms or concerns 

## 2022-09-24 NOTE — Assessment & Plan Note (Signed)
Possibly overcontrolled, ok for reduced amlodipine to 5 mg qd

## 2022-09-24 NOTE — Assessment & Plan Note (Signed)
Also for referral ENT

## 2022-09-24 NOTE — Assessment & Plan Note (Addendum)
With hx of right cerebellar stroke, cont eliquis, zetia and for MRI r/o worsening

## 2022-09-25 ENCOUNTER — Ambulatory Visit: Payer: Medicare Other | Admitting: Internal Medicine

## 2022-10-03 ENCOUNTER — Other Ambulatory Visit: Payer: Self-pay | Admitting: Internal Medicine

## 2022-10-03 ENCOUNTER — Telehealth: Payer: Self-pay | Admitting: Internal Medicine

## 2022-10-03 DIAGNOSIS — I639 Cerebral infarction, unspecified: Secondary | ICD-10-CM

## 2022-10-03 NOTE — Telephone Encounter (Signed)
Pt called wanting to know his results from imaging.

## 2022-10-04 ENCOUNTER — Other Ambulatory Visit: Payer: Self-pay | Admitting: Internal Medicine

## 2022-10-04 MED ORDER — ROSUVASTATIN CALCIUM 40 MG PO TABS: 40.0000 mg | ORAL_TABLET | Freq: Every day | ORAL | 3 refills | Status: DC

## 2022-10-04 MED ORDER — APIXABAN 2.5 MG PO TABS: 2.5000 mg | ORAL_TABLET | Freq: Two times a day (BID) | ORAL | 3 refills | Status: DC

## 2022-10-04 NOTE — Telephone Encounter (Signed)
Called and went over results with Pt.

## 2022-10-04 NOTE — Telephone Encounter (Signed)
Ok I sent Eliquis and Crestor  (Not plavix, sorry, I was mistaken - his current list has eliquis)

## 2022-10-05 ENCOUNTER — Encounter: Payer: Self-pay | Admitting: Neurology

## 2022-10-17 ENCOUNTER — Telehealth: Payer: Self-pay | Admitting: Internal Medicine

## 2022-10-17 NOTE — Telephone Encounter (Signed)
Pt dropped off a disability parking form that needs to be looked over and signed with Dr. Jonny Ruiz. Please let us know when this is ready for pt pick up. Form is in Dr [employer].  Charles [name]

## 2022-10-17 NOTE — Telephone Encounter (Signed)
Placed on providers desk

## 2022-10-18 NOTE — Telephone Encounter (Signed)
Called and let Pt know the form is up front for pickup.

## 2022-10-18 NOTE — Telephone Encounter (Signed)
Ok this is done 

## 2022-10-26 NOTE — Progress Notes (Signed)
Initial neurology clinic note  Charles [name] MRN: [id number] DOB: 16-Nov-1950  Referring provider: Corwin Levins, MD  Primary care provider: Corwin Levins, MD  Reason for consult:  worsening ambulation, hx of right cerebellar stroke  Subjective:  This is Charles [name], a 72 y.o. right-handed male with a medical history of cerebellar stroke, HTN, HLD, CAD, PVD, left plantar fasciitis, hearing loss, former smoker who presents to neurology clinic with worsening ambulation, hx of right cerebellar stroke. The patient is accompanied by wife.  Patient noticed balance problems about a month ago. When he gets up he feels off balance for a few seconds. When he walks, he feels his stance is wider and that he may fall to the left while walking. He has not fallen. He denies any pain. He will sit down for a few minutes and may have some improve. He resets himself. He does not have clear spinning or dizziness. He endorses cramps in his calves, but denies numbness and tingling. He denies bowel or bladder changes.  He also noticed blurry vision once while watching TV. He covered one eye and this resolved the blurry vision. This lasted about a minute, then went away. He has only had this issue one time though. He has chronic ptosis of the left eyelid, but this is not new or changing. He denies difficulty chewing or swallowing.   Patient feels weak in the arms and legs. He thinks the arms are due to chronic rotator cuff issues. He thinks his legs are weak due to inactivity. He has never had neck or back surgery and does not have spine issues as far as he knows. He denies neck or back pain.  Patient was seen by PCP and sent to an "ear doctor (ENT)" whose notes are outside of our system. He was seen last week and told he had a whole in his right ear. He is scheduled for balance testing in about a month.  Right cerebellar infarct was noted on MRI brain from 04/20/2014 as remote. MRI brain on 09/23/22 showed no  new infarct. Bilateral mastoid effusions were noted.  Patient saw a neurologist about 8 years ago for numbness, tingling, and cramps in legs. He was told he had neuropathy.  Patient does not take any supplements.  EtOH use: usually every couple of days will have 3-4 drinks (airplane bottles) Restricted diet: No Family history of neurologic disease: none  MEDICATIONS:  Outpatient Encounter Medications as of 11/03/2022  Medication Sig   albuterol (VENTOLIN HFA) 108 (90 Base) MCG/ACT inhaler Inhale 2 puffs into the lungs every 6 (six) hours as needed for wheezing or shortness of breath.   amLODipine (NORVASC) 5 MG tablet Take 1 tablet (5 mg total) by mouth daily.   apixaban (ELIQUIS) 2.5 MG TABS tablet Take 1 tablet (2.5 mg total) by mouth 2 (two) times daily.   chlorproMAZINE (THORAZINE) 10 MG tablet Take 1 tablet (10 mg total) by mouth 3 (three) times daily as needed for up to 10 doses. To help with hiccups   ezetimibe (ZETIA) 10 MG tablet Take 1 tablet (10 mg total) by mouth daily.   guaiFENesin (MUCINEX) 600 MG 12 hr tablet Take 2 tablets (1,200 mg total) by mouth 2 (two) times daily as needed.   hydrOXYzine (ATARAX/VISTARIL) 10 MG tablet Take 1 tablet (10 mg total) by mouth 3 (three) times daily as needed for itching.   ondansetron (ZOFRAN) 4 MG tablet Take 1 tablet (4 mg total) by mouth  every 6 (six) hours.   pantoprazole (PROTONIX) 40 MG tablet Take 1 tablet (40 mg total) by mouth daily.   rosuvastatin (CRESTOR) 40 MG tablet Take 1 tablet (40 mg total) by mouth daily.   sucralfate (CARAFATE) 1 g tablet TAKE 1 TABLET BY MOUTH FOUR TIMES A DAY   tadalafil (CIALIS) 20 MG tablet TAKE 1 TABLET BY MOUTH EVERY DAY AS NEEDED FOR ERECTILE DYSFUNCTION   temazepam (RESTORIL) 15 MG capsule TAKE 1 CAPSULE BY MOUTH AT BEDTIME AS NEEDED   tiZANidine (ZANAFLEX) 2 MG tablet TAKE 1 TABLET BY MOUTH TWICE DAILY AS NEEDED FOR MUSCLE SPASM   fexofenadine (ALLEGRA) 180 MG tablet Take 1 tablet (180 mg total)  by mouth daily. (Patient not taking: Reported on 11/03/2022)   Multiple Vitamins-Minerals (MULTIVITAMIN WITH MINERALS) tablet Take 1 tablet by mouth daily. Men 50 + (Patient not taking: Reported on 11/03/2022)   tamsulosin (FLOMAX) 0.4 MG CAPS capsule Take 1 capsule (0.4 mg total) by mouth daily. (Patient not taking: Reported on 11/03/2022)   triamcinolone (NASACORT) 55 MCG/ACT AERO nasal inhaler Place 2 sprays into the nose daily. (Patient not taking: Reported on 11/03/2022)   [DISCONTINUED] temazepam (RESTORIL) 15 MG capsule TAKE 1 CAPSULE BY MOUTH AT BEDTIME AS NEEDED   No facility-administered encounter medications on file as of 11/03/2022.    PAST MEDICAL HISTORY: Past Medical History:  Diagnosis Date   ABNORMAL ELECTROCARDIOGRAM 11/02/2008   ABSCESS, LUNG 10/23/2006   Anemia 05/26/2014   BACTERIAL PNEUMONIA 12/28/2009   BPH (benign prostatic hyperplasia) 11/22/2010   Cerebellar stroke (HCC) 05/26/2014   CHEST PAIN 12/24/2009   Coronary artery calcification seen on CT scan 06/01/2015   Cramp of limb 07/19/2007   DIVERTICULOSIS, COLON 03/07/2007   DVT (deep venous thrombosis) (HCC) 01/2014   LLE   Emphysema of lung (HCC)    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/23/2006   FLANK PAIN, LEFT 02/25/2010   FREQUENCY, URINARY 12/24/2009   HEMORRHOIDS 10/23/2006   HYPERLIPIDEMIA 10/23/2006   HYPERTENSION 10/23/2006   PE (pulmonary embolism) 02/2014   PLANTAR FASCIITIS, LEFT 11/02/2008   Pneumonia 12/2013 X 2   PSA, INCREASED 11/20/2007   PULMONARY NODULE 05/14/2008   Unspecified Peripheral Vascular Disease 10/23/2006    PAST SURGICAL HISTORY: Past Surgical History:  Procedure Laterality Date   BIOPSY  09/29/2019   Procedure: BIOPSY;  Surgeon: Beverley Fiedler, MD;  Location: [facility];  Service: Endoscopy;;   BIOPSY  12/25/2019   Procedure: BIOPSY;  Surgeon: Rachael Fee, MD;  Location: [facility];  Service: Endoscopy;;   COLONOSCOPY     ESOPHAGOGASTRODUODENOSCOPY (EGD) WITH PROPOFOL N/A 06/15/2017   Procedure:  ESOPHAGOGASTRODUODENOSCOPY (EGD) WITH PROPOFOL;  Surgeon: Beverley Fiedler, MD;  Location: [facility];  Service: Gastroenterology;  Laterality: N/A;   ESOPHAGOGASTRODUODENOSCOPY (EGD) WITH PROPOFOL N/A 09/29/2019   Procedure: ESOPHAGOGASTRODUODENOSCOPY (EGD) WITH PROPOFOL;  Surgeon: Beverley Fiedler, MD;  Location: [facility];  Service: Endoscopy;  Laterality: N/A;   ESOPHAGOGASTRODUODENOSCOPY (EGD) WITH PROPOFOL N/A 12/25/2019   Procedure: ESOPHAGOGASTRODUODENOSCOPY (EGD) WITH PROPOFOL;  Surgeon: Rachael Fee, MD;  Location: [facility];  Service: Endoscopy;  Laterality: N/A;   EUS N/A 12/25/2019   Procedure: UPPER ENDOSCOPIC ULTRASOUND (EUS) RADIAL;  Surgeon: Rachael Fee, MD;  Location: [facility];  Service: Endoscopy;  Laterality: N/A;   HEMORRHOID SURGERY  ?1990's   INGUINAL HERNIA REPAIR Bilateral ?2000's   IVC FILTER INSERTION N/A 10/03/2016   Procedure: IVC Filter Retrieveal;  Surgeon: Nada Libman, MD;  Location: [facility];  Service: Cardiovascular;  Laterality: N/A;   MYRINGOTOMY WITH TUBE PLACEMENT Right 06/26/2018   SHOULDER ARTHROSCOPY W/ ROTATOR CUFF REPAIR Right 11/2013   STAPLING OF BLEBS Right 07/31/2016   Procedure: BLEBECTOMY;  Surgeon: Loreli Slot, MD;  Location: [facility] OR;  Service: Thoracic;  Laterality: Right;   VENA CAVA FILTER PLACEMENT Right 08/04/2016   Procedure: INSERTION VENA-CAVA FILTER;  Surgeon: Loreli Slot, MD;  Location: [facility] OR;  Service: Thoracic;  Laterality: Right;   VIDEO ASSISTED THORACOSCOPY Right 07/31/2016   Procedure: VIDEO ASSISTED THORACOSCOPY;  Surgeon: Loreli Slot, MD;  Location: [facility] OR;  Service: Thoracic;  Laterality: Right;   VIDEO ASSISTED THORACOSCOPY Right 08/04/2016   Procedure: REDO VIDEO ASSISTED THORACOSCOPY- EVACUATION OF HEMOTHORAX;  Surgeon: Loreli Slot, MD;  Location: [facility] OR;  Service: Thoracic;  Laterality: Right;   VIDEO BRONCHOSCOPY WITH ENDOBRONCHIAL NAVIGATION N/A 03/11/2014    Procedure: VIDEO BRONCHOSCOPY WITH ENDOBRONCHIAL NAVIGATION;  Surgeon: Leslye Peer, MD;  Location: [facility];  Service: Thoracic;  Laterality: N/A;    ALLERGIES: Allergies  Allergen Reactions   Pylera [Bis Subcit-Metronid-Tetracyc]     Itching     FAMILY HISTORY: Family History  Problem Relation Age of Onset   Heart disease Mother    Heart disease Father    Kidney disease Sister        Renal transplant   Colon cancer Neg Hx    Rectal cancer Neg Hx    Esophageal cancer Neg Hx    Stomach cancer Neg Hx     SOCIAL HISTORY: Social History   Tobacco Use   Smoking status: Former    Current packs/day: 1.00    Average packs/day: 1 pack/day for 15.0 years (15.0 ttl pk-yrs)    Types: Cigarettes   Smokeless tobacco: Never   Tobacco comments:    "quit smoking cigarettes in the 1990's"  Vaping Use   Vaping status: Never Used  Substance Use Topics   Alcohol use: Yes    Comment: occassionaly  2 drinks a week   Drug use: Yes    Types: Marijuana    Comment: daily use /last used sunday   Social History   Social History Narrative   Are you right handed or left handed? Right   Are you currently employed ?    What is your current occupation? retired   Do you live at home alone?   Who lives with you? wife   What type of home do you live in: 1 story or 2 story? two    Caffeine 1 cup of coffee and 1 soda    Objective:  Vital Signs:  BP 115/65   Pulse 86   Ht 6\' 4"  (1.93 m)   Wt 179 lb (81.2 kg)   SpO2 100%   BMI 21.79 kg/m   General: General appearance: Awake and alert. No distress. Cooperative with exam.  Skin: No obvious rash or jaundice. HEENT: Atraumatic. Anicteric. Lungs: Non-labored breathing on room air  Heart: Regular. No carotid bruits Extremities: No edema. High arches and hammertoes Musculoskeletal: No obvious joint swelling. Psych: Affect appropriate.  Neurological: Mental Status: Alert. Speech fluent. No pseudobulbar affect Cranial Nerves: CNII:  No RAPD. Visual fields intact. CNIII, IV, VI: PERRL. No nystagmus. EOMI. CN V: Facial sensation intact bilaterally to fine touch. CN VII: Facial muscles symmetric and strong. Ptosis (L > R). CN VIII: Hears finger rub well on left, hearing aid on right. CN IX: No hypophonia. CN X: Palate elevates symmetrically. CN XI:  Full strength shoulder shrug bilaterally. CN XII: Tongue protrusion full and midline. No atrophy or fasciculations. No significant dysarthria Motor: Tone is mildly increased in LUE and LLE compared to right. No fasciculations in extremities. No atrophy. No grip or percussive myotonia.  Individual muscle group testing (MRC grade out of 5):  Movement     Neck flexion 5    Neck extension 5     Right Left   Shoulder abduction 5 5   Shoulder adduction 5 5   Shoulder ext rotation 4+ 5-   Shoulder int rotation 5 5   Elbow flexion 5 5   Elbow extension 5 5   Wrist extension 5 5   Wrist flexion 5 5   Finger abduction - FDI 5 5   Finger abduction - ADM 5 5   Finger extension 5 5   Finger distal flexion - 2/3 5 5    Finger distal flexion - 4/5 5 5    Thumb flexion - FPL 5 5   Thumb abduction - APB 5 5    Hip flexion 5 5   Hip extension 5 5   Hip adduction 5 5   Hip abduction 5 5   Knee extension 5 5   Knee flexion 5 5   Dorsiflexion 5 5   Plantarflexion 5 5   Inversion 5 5   Eversion 5 5   Great toe extension 5 5   Great toe flexion 5 5     Reflexes:  Right Left   Bicep 2+ 2+   Tricep 2+ 2+   BrRad 2+ 2+   Knee 2+ 2+   Ankle 0 0    Pathological Reflexes: Babinski: flexor response bilaterally Hoffman: absent bilaterally Troemner: absent bilaterally Sensation: Pinprick: Intact in all extremities Vibration: Absent in bilateral great toes, diminished in bilateral ankles, fully present in bilateral patella Proprioception: Intact in bilateral great toes. Coordination: Intact finger-to- nose-finger bilaterally. Romberg negative. Gait: Able to rise from  chair with arms crossed unassisted. Wide-based gait, unsteady   Labs and Imaging review: Internal labs: Lab Results  Component Value Date   HGBA1C 4.8 03/27/2022   Lab Results  Component Value Date   VITAMINB12 359 03/27/2022   Lab Results  Component Value Date   TSH 3.45 03/27/2022   Lipid panel: Component     Latest Ref Rng 03/27/2022  Cholesterol     0 - 200 mg/dL 578   Triglycerides     0.0 - 149.0 mg/dL 469.6   HDL Cholesterol     >39.00 mg/dL 29.52   VLDL     0.0 - 40.0 mg/dL 84.1   LDL (calc)     0 - 99 mg/dL 74   Total CHOL/HDL Ratio 3   NonHDL 103.70    Vit D (03/27/22): 24.33  Imaging: MRI brain w/wo contrast (04/20/2014): FINDINGS: Diffuse prominence of the CSF containing spaces is compatible with generalized cerebral atrophy. Scattered and patchy T2/FLAIR hyperintensity within the periventricular deep white matter both cerebral hemispheres most consistent with chronic small vessel ischemic disease, fairly mild. Remote infarct present within the right cerebellar hemisphere.   Previously identified hyperdense lesion within the anterior right frontal lobe demonstrates heterogeneous T2 and FLAIR signal intensity with blooming artifact on gradient echo sequence. There is a faint waist of enhancement within this lesion on postcontrast sequence, favored to reflect enhancement related to blood brain barrier breakdown. This lesion measures 11 x 12 x 13 mm. Finding is favored to reflect a small parenchymal contusion  related to recent trauma.   No other focal intracranial lesions identified. Mild diffuse pachymeningeal enhancement noted, nonspecific, and may be related to prior intervention. No other evidence for CNS hypotension.   No acute intracranial infarct. Gray-white matter differentiation maintained.   No midline shift or mass effect. Ventricles normal in size without evidence of hydrocephalus. No extra-axial fluid collection.   Left frontal scalp  hematoma again noted. No acute abnormality seen about the orbits.   Scattered mucoperiosteal thickening present within the ethmoidal air cells and left frontal sinus. Scattered fluid signal intensity present within the mastoid air cells bilaterally.   Craniocervical junction within normal limits. Scattered degenerative changes noted within the visualized upper cervical spine.   IMPRESSION: 1. 11 x 12 x 13 mm right frontal lobe lesion, which corresponds to previously seen hyperdensity on prior CT. This is favored to reflect a small parenchymal contusion. Short interval follow-up CT to ensure interval resolution recommended. 2. Left frontal scalp hematoma. 3. No other acute intracranial process. 4. Remote right cerebellar infarct. 5. Atrophy with mild chronic microvascular ischemic disease.  MRI brain wo contrast (09/23/22): FINDINGS: Brain: Diffusion imaging does not show any acute or subacute infarction or other cause of restricted diffusion. Mild chronic small-vessel ischemic change affects pons. There is an old cerebellar infarction in the midportion on the right. Cerebral hemispheres show age related volume loss with mild to moderate chronic small-vessel ischemic changes of the white matter. No large vessel territory stroke. No mass lesion, hydrocephalus or extra-axial collection. Small focus of hemosiderin deposition in the right frontal lobe consistent with some residual hemosiderin from a frontal lobe contusion sustained in 2016. No hydrocephalus or extra-axial collection.   Vascular: Flow   Skull and upper cervical spine: Negative   Sinuses/Orbits: Clear sinuses.  Orbits negative.   Other: Bilateral mastoid effusions which could be associated with hearing loss.   IMPRESSION: 1. No acute or reversible intracranial finding. Old cerebellar infarction on the right. Chronic small-vessel ischemic changes of the pons and cerebral hemispheric white matter. 2. Small focus  of hemosiderin deposition in the right frontal lobe consistent with some residual hemosiderin from a frontal lobe contusion sustained in 2016. 3. Bilateral mastoid effusions which could be associated with hearing loss.  Echocardiogram (06/14/17): Study Conclusions  - Left ventricle: The cavity size was normal. Wall thickness was    increased in a pattern of mild LVH. Systolic function was normal.    The estimated ejection fraction was in the range of 55% to 60%.    Wall motion was normal; there were no regional wall motion    abnormalities.  - Aortic valve: Trileaflet; mildly thickened, mildly calcified    leaflets.  - Left atrium: The atrium was mildly dilated.    Assessment/Plan:  Charles [name] is a 72 y.o. male who presents for evaluation of imbalance. He has a relevant medical history of cerebellar stroke, HTN, HLD, CAD, PVD, left plantar fasciitis, hearing loss, former smoker. His neurological examination is pertinent for external rotation weakness (likely secondary to known rotator cuff issues), diminished vibratory sensation in distal lower extremities, and wide based, unsteady gait. Available diagnostic data is significant for MRI brain showing old right cerebellar infarct (was chronic on MRI from 2016). HbA1c is 4.8, B12 is 359. Patient's symptoms of imbalance and impaired ambulation is likely multifactorial with contributions from prior right cerebellar stroke, right inner ear dysfunction, and likely peripheral neuropathy. His neuropathy is likely very chronic given his hammertoes and high arches. He has  no known risk factors for neuropathy though. What has caused recent worsening is also not clear. He is being worked up by ENT with vestibular testing. It that is unremarkable, patient is agreeable to EMG to see if neuropathy is present and the driving force of recent worsening.  PLAN: -Blood work: MMA, B1, IFE -EMG discussed, patient would rather complete balance testing with ENT  first, then reconsider EMG to look at severity and if PN is active currently -Vitamin D 1000 international units (IU) daily  -Referral to Vestibular/balance therapy -Get ENT notes and evaluation from Su Philomena Doheny, MD (ENT) [address] [phone]  -Return to clinic in 3 months  The impression above as well as the plan as outlined below were extensively discussed with the patient (in the company of wife) who voiced understanding. All questions were answered to their satisfaction.  The patient was counseled on pertinent fall precautions per the printed material provided today, and as noted under the "Patient Instructions" section below.  When available, results of the above investigations and possible further recommendations will be communicated to the patient via telephone/MyChart. Patient to call office if not contacted after expected testing turnaround time.   Total time spent reviewing records, interview, history/exam, documentation, and coordination of care on day of encounter:  65 min   Thank you for allowing me to participate in patient's care.  If I can answer any additional questions, I would be pleased to do so.  Jacquelyne Balint, MD   CC: Corwin Levins, MD [address] [place] [address]  CC: Referring provider: Corwin Levins, MD [address] [place],  [place] [address]

## 2022-10-27 ENCOUNTER — Other Ambulatory Visit: Payer: Self-pay

## 2022-10-27 MED ORDER — TEMAZEPAM 15 MG PO CAPS: ORAL_CAPSULE | ORAL | 2 refills | Status: DC

## 2022-11-03 ENCOUNTER — Encounter: Payer: Self-pay | Admitting: Neurology

## 2022-11-03 ENCOUNTER — Other Ambulatory Visit: Payer: Medicare Other

## 2022-11-03 ENCOUNTER — Ambulatory Visit (INDEPENDENT_AMBULATORY_CARE_PROVIDER_SITE_OTHER): Payer: Medicare Other | Admitting: Neurology

## 2022-11-03 VITALS — BP 115/65 | HR 86 | Ht 76.0 in | Wt 179.0 lb

## 2022-11-03 DIAGNOSIS — G629 Polyneuropathy, unspecified: Secondary | ICD-10-CM

## 2022-11-03 DIAGNOSIS — R2689 Other abnormalities of gait and mobility: Secondary | ICD-10-CM

## 2022-11-03 DIAGNOSIS — E559 Vitamin D deficiency, unspecified: Secondary | ICD-10-CM

## 2022-11-03 DIAGNOSIS — I639 Cerebral infarction, unspecified: Secondary | ICD-10-CM

## 2022-11-03 DIAGNOSIS — F1010 Alcohol abuse, uncomplicated: Secondary | ICD-10-CM | POA: Diagnosis not present

## 2022-11-03 NOTE — Patient Instructions (Addendum)
I saw you today for imbalance. 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 You have not had a new stroke, but sometimes the old stroke can contribute to symptoms. Neuropathy - poor sensation in your feet. I will send labs to look for other causes, but cutting back on alcohol may help prevent future worsening. Inner ear problem for which you see ENT. Continue to see them about this and get the balance  I will get labs today.  I will send you to therapy for your balance.  I will see you back in 3 months to check your progress. Please let me know if you have any questions or concerns in the meantime.  The physicians and staff at [facility] are committed to providing excellent care. You may receive a survey requesting feedback about your experience at our office. We strive to receive "very good" responses to the survey questions. If you feel that your experience would prevent you from giving the office a "very good " response, please contact our office to try to remedy the situation. We may be reached at [phone]. Thank you for taking the time out of your busy day to complete the survey.  Jacquelyne Balint, MD [facility]  Preventing Falls at [facility] are common, often dreaded events in the lives of older people. Aside from the obvious injuries and even death that may result, fall can cause wide-ranging consequences including loss of independence, mental decline, decreased activity and mobility. Younger people are also at risk of falling, especially those with chronic illnesses and fatigue.  Ways to reduce risk for falling Examine diet and medications. Warm foods and alcohol dilate blood vessels, which can lead to dizziness when standing. Sleep aids, antidepressants and pain medications can also increase the likelihood of a fall.  Get a vision exam. Poor vision, cataracts and glaucoma increase the chances of  falling.  Check foot gear. Shoes should fit snugly and have a sturdy, nonskid sole and a broad, low heel  Participate in a physician-approved exercise program to build and maintain muscle strength and improve balance and coordination. Programs that use ankle weights or stretch bands are excellent for muscle-strengthening. Water aerobics programs and low-impact Tai Chi programs have also been shown to improve balance and coordination.  Increase vitamin D intake. Vitamin D improves muscle strength and increases the amount of calcium the body is able to absorb and deposit in bones.  How to prevent falls from common hazards Floors - Remove all loose wires, cords, and throw rugs. Minimize clutter. Make sure rugs are anchored and smooth. Keep furniture in its usual place.  Chairs -- Use chairs with straight backs, armrests and firm seats. Add firm cushions to existing pieces to add height.  Bathroom - Install grab bars and non-skid tape in the tub or shower. Use a bathtub transfer bench or a shower chair with a back support Use an elevated toilet seat and/or safety rails to assist standing from a low surface. Do not use towel racks or bathroom tissue holders to help you stand.  Lighting - Make sure halls, stairways, and entrances are well-lit. Install a night light in your bathroom or hallway. Make sure there is a light switch at the top and bottom of the staircase. Turn lights on if you get up in the middle of the night. Make sure lamps or light switches are within reach of the bed if you have to  get up during the night.  Kitchen - Install non-skid rubber mats near the sink and stove. Clean spills immediately. Store frequently used utensils, pots, pans between waist and eye level. This helps prevent reaching and bending. Sit when getting things out of lower cupboards.  Living room/ Bedrooms - Place furniture with wide spaces in between, giving enough room to move around. Establish a route through the  living room that gives you something to hold onto as you walk.  Stairs - Make sure treads, rails, and rugs are secure. Install a rail on both sides of the stairs. If stairs are a threat, it might be helpful to arrange most of your activities on the lower level to reduce the number of times you must climb the stairs.  Entrances and doorways - Install metal handles on the walls adjacent to the doorknobs of all doors to make it more secure as you travel through the doorway.  Tips for maintaining balance Keep at least one hand free at all times. Try using a backpack or fanny pack to hold things rather than carrying them in your hands. Never carry objects in both hands when walking as this interferes with keeping your balance.  Attempt to swing both arms from front to back while walking. This might require a conscious effort if Parkinson's disease has diminished your movement. It will, however, help you to maintain balance and posture, and reduce fatigue.  Consciously lift your feet off of the ground when walking. Shuffling and dragging of the feet is a common culprit in losing your balance.  When trying to navigate turns, use a "U" technique of facing forward and making a wide turn, rather than pivoting sharply.  Try to stand with your feet shoulder-length apart. When your feet are close together for any length of time, you increase your risk of losing your balance and falling.  Do one thing at a time. Don't try to walk and accomplish another task, such as reading or looking around. The decrease in your automatic reflexes complicates motor function, so the less distraction, the better.  Do not wear rubber or gripping soled shoes, they might "catch" on the floor and cause tripping.  Move slowly when changing positions. Use deliberate, concentrated movements and, if needed, use a grab bar or walking aid. Count 15 seconds between each movement. For example, when rising from a seated position, wait 15  seconds after standing to begin walking.  If balance is a continuous problem, you might want to consider a walking aid such as a cane, walking stick, or walker. Once you've mastered walking with help, you might be ready to try it on your own again.

## 2022-11-03 NOTE — Addendum Note (Signed)
Addended by: Lenise Herald on: 11/03/2022 03:02 PM   Modules accepted: Orders

## 2022-11-07 ENCOUNTER — Telehealth: Payer: Self-pay | Admitting: Internal Medicine

## 2022-11-07 MED ORDER — TEMAZEPAM 15 MG PO CAPS: ORAL_CAPSULE | ORAL | 2 refills | Status: DC

## 2022-11-07 NOTE — Telephone Encounter (Signed)
Patient said [facility] has not received the prescription for temazepam (RESTORIL) 15 MG capsule. Patient was informed and he said he would contact the pharmacy, but he would like to know if it can be re-sent. Best callback is [phone].

## 2022-11-07 NOTE — Telephone Encounter (Signed)
Ok done erx

## 2022-11-11 LAB — METHYLMALONIC ACID, SERUM: Methylmalonic Acid, Quant: 224 nmol/L (ref 69–390)

## 2022-11-11 LAB — IMMUNOFIXATION ELECTROPHORESIS
IgG (Immunoglobin G), Serum: 1229 mg/dL (ref 600–1540)
IgM, Serum: 51 mg/dL (ref 50–300)
Immunoglobulin A: 543 mg/dL — ABNORMAL HIGH (ref 70–320)

## 2022-11-11 LAB — VITAMIN B1: Vitamin B1 (Thiamine): <6 nmol/L — ABNORMAL LOW (ref 8–30)

## 2022-11-14 ENCOUNTER — Ambulatory Visit: Payer: Medicare Other | Attending: Neurology | Admitting: Physical Therapy

## 2022-11-14 ENCOUNTER — Encounter: Payer: Self-pay | Admitting: Physical Therapy

## 2022-11-14 VITALS — BP 117/73 | HR 76

## 2022-11-14 DIAGNOSIS — I639 Cerebral infarction, unspecified: Secondary | ICD-10-CM | POA: Diagnosis not present

## 2022-11-14 DIAGNOSIS — E559 Vitamin D deficiency, unspecified: Secondary | ICD-10-CM | POA: Diagnosis not present

## 2022-11-14 DIAGNOSIS — G629 Polyneuropathy, unspecified: Secondary | ICD-10-CM | POA: Insufficient documentation

## 2022-11-14 DIAGNOSIS — F1010 Alcohol abuse, uncomplicated: Secondary | ICD-10-CM | POA: Diagnosis not present

## 2022-11-14 DIAGNOSIS — R2689 Other abnormalities of gait and mobility: Secondary | ICD-10-CM | POA: Diagnosis present

## 2022-11-14 DIAGNOSIS — R2681 Unsteadiness on feet: Secondary | ICD-10-CM | POA: Insufficient documentation

## 2022-11-14 NOTE — Therapy (Signed)
OUTPATIENT PHYSICAL THERAPY NEURO EVALUATION   Patient Name: Charles [name] MRN: [id number] DOB:1950-05-31, 72 y.o., male Today's Date: 11/14/2022   PCP: Corwin Levins, MD   REFERRING PROVIDER:  Antony Madura, MD  END OF SESSION:  PT End of Session - 11/14/22 0936     Visit Number 1    Number of Visits 9    Date for PT Re-Evaluation 01/13/23    Authorization Type Medicare    PT Start Time 0934    PT Stop Time 1012    PT Time Calculation (min) 38 min    Equipment Utilized During Treatment Gait belt    Activity Tolerance Patient tolerated treatment well    Behavior During Therapy WFL for tasks assessed/performed             Past Medical History:  Diagnosis Date   ABNORMAL ELECTROCARDIOGRAM 11/02/2008   ABSCESS, LUNG 10/23/2006   Anemia 05/26/2014   BACTERIAL PNEUMONIA 12/28/2009   BPH (benign prostatic hyperplasia) 11/22/2010   Cerebellar stroke (HCC) 05/26/2014   CHEST PAIN 12/24/2009   Coronary artery calcification seen on CT scan 06/01/2015   Cramp of limb 07/19/2007   DIVERTICULOSIS, COLON 03/07/2007   DVT (deep venous thrombosis) (HCC) 01/2014   LLE   Emphysema of lung (HCC)    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/23/2006   FLANK PAIN, LEFT 02/25/2010   FREQUENCY, URINARY 12/24/2009   HEMORRHOIDS 10/23/2006   HYPERLIPIDEMIA 10/23/2006   HYPERTENSION 10/23/2006   PE (pulmonary embolism) 02/2014   PLANTAR FASCIITIS, LEFT 11/02/2008   Pneumonia 12/2013 X 2   PSA, INCREASED 11/20/2007   PULMONARY NODULE 05/14/2008   Unspecified Peripheral Vascular Disease 10/23/2006   Past Surgical History:  Procedure Laterality Date   BIOPSY  09/29/2019   Procedure: BIOPSY;  Surgeon: Beverley Fiedler, MD;  Location: [facility];  Service: Endoscopy;;   BIOPSY  12/25/2019   Procedure: BIOPSY;  Surgeon: Rachael Fee, MD;  Location: [facility];  Service: Endoscopy;;   COLONOSCOPY     ESOPHAGOGASTRODUODENOSCOPY (EGD) WITH PROPOFOL N/A 06/15/2017   Procedure: ESOPHAGOGASTRODUODENOSCOPY (EGD) WITH PROPOFOL;   Surgeon: Beverley Fiedler, MD;  Location: [facility];  Service: Gastroenterology;  Laterality: N/A;   ESOPHAGOGASTRODUODENOSCOPY (EGD) WITH PROPOFOL N/A 09/29/2019   Procedure: ESOPHAGOGASTRODUODENOSCOPY (EGD) WITH PROPOFOL;  Surgeon: Beverley Fiedler, MD;  Location: [facility];  Service: Endoscopy;  Laterality: N/A;   ESOPHAGOGASTRODUODENOSCOPY (EGD) WITH PROPOFOL N/A 12/25/2019   Procedure: ESOPHAGOGASTRODUODENOSCOPY (EGD) WITH PROPOFOL;  Surgeon: Rachael Fee, MD;  Location: [facility];  Service: Endoscopy;  Laterality: N/A;   EUS N/A 12/25/2019   Procedure: UPPER ENDOSCOPIC ULTRASOUND (EUS) RADIAL;  Surgeon: Rachael Fee, MD;  Location: [facility];  Service: Endoscopy;  Laterality: N/A;   HEMORRHOID SURGERY  ?1990's   INGUINAL HERNIA REPAIR Bilateral ?2000's   IVC FILTER INSERTION N/A 10/03/2016   Procedure: IVC Filter Retrieveal;  Surgeon: Nada Libman, MD;  Location: [facility];  Service: Cardiovascular;  Laterality: N/A;   MYRINGOTOMY WITH TUBE PLACEMENT Right 06/26/2018   SHOULDER ARTHROSCOPY W/ ROTATOR CUFF REPAIR Right 11/2013   STAPLING OF BLEBS Right 07/31/2016   Procedure: BLEBECTOMY;  Surgeon: Loreli Slot, MD;  Location: [facility] OR;  Service: Thoracic;  Laterality: Right;   VENA CAVA FILTER PLACEMENT Right 08/04/2016   Procedure: INSERTION VENA-CAVA FILTER;  Surgeon: Loreli Slot, MD;  Location: [facility] OR;  Service: Thoracic;  Laterality: Right;   VIDEO ASSISTED THORACOSCOPY Right 07/31/2016   Procedure: VIDEO ASSISTED THORACOSCOPY;  Surgeon:  Loreli Slot, MD;  Location: [facility] OR;  Service: Thoracic;  Laterality: Right;   VIDEO ASSISTED THORACOSCOPY Right 08/04/2016   Procedure: REDO VIDEO ASSISTED THORACOSCOPY- EVACUATION OF HEMOTHORAX;  Surgeon: Loreli Slot, MD;  Location: [facility] OR;  Service: Thoracic;  Laterality: Right;   VIDEO BRONCHOSCOPY WITH ENDOBRONCHIAL NAVIGATION N/A 03/11/2014   Procedure: VIDEO BRONCHOSCOPY WITH  ENDOBRONCHIAL NAVIGATION;  Surgeon: Leslye Peer, MD;  Location: [facility] OR;  Service: Thoracic;  Laterality: N/A;   Patient Active Problem List   Diagnosis Date Noted   Elevated PSA 09/24/2022   Right ear pain 09/24/2022   RUQ pain 03/27/2022   BPH associated with nocturia 09/24/2021   Arm paresthesia, left 07/10/2021   Aortic atherosclerosis (HCC) 09/08/2020   Chronic anticoagulation 12/27/2019   Acute upper GI bleed 09/30/2019   Hiatal hernia    Gastritis and gastroduodenitis    Duodenal nodule    Coagulopathy (HCC) 09/28/2019   Hypokalemia 09/06/2019   Dysphagia 09/05/2019   Multiple contusions 12/11/2018   Vitamin D deficiency 12/11/2018   History of DVT (deep vein thrombosis) 11/25/2018   Primary osteoarthritis of left knee 02/01/2018   Insomnia 07/13/2017   ATN (acute tubular necrosis) (HCC)    Melena    Balance problems 06/13/2017   Acute hearing loss, right 06/13/2017   Dehydration 06/13/2017   Symptomatic anemia 06/13/2017   Hx of pulmonary embolus 06/13/2017   Thrombocytopenia (HCC) 06/13/2017   Alcohol abuse 06/13/2017   AKI (acute kidney injury) (HCC) 06/13/2017   Lung blebs (HCC) 07/31/2016   Acute DVT (deep venous thrombosis) (HCC) 07/25/2016   Pneumothorax on right 07/23/2016   CKD (chronic kidney disease), stage III (HCC) 07/23/2016   Gross hematuria 04/25/2016   Dysfunction of both eustachian tubes 10/06/2015   UTI (urinary tract infection) 08/26/2015   Bilateral hearing loss 08/26/2015   Coronary artery calcification seen on CT scan 06/01/2015   Low back pain 06/01/2015   Hyperglycemia 12/18/2014   Anemia 05/26/2014   Cerebellar stroke (HCC) 05/26/2014   Brain contusion (HCC) 04/23/2014   Lung nodule 03/09/2014   Acute pulmonary embolism (HCC) 02/18/2014   Multiple nodules of lung 02/18/2014   Cough 12/24/2013   Allergic rhinitis 12/24/2013   Other chest pain 12/24/2013   Increased prostate specific antigen (PSA) velocity 11/20/2013   Eustachian  tube dysfunction 11/20/2013   Hearing loss on right 11/20/2013   Serous otitis media 11/15/2013   Muscle tightness 10/02/2013   Rotator cuff injury 09/17/2013   Skin lesion 07/25/2013   Anal pain 12/18/2012   Habitual alcohol use 11/17/2012   Cellulitis and abscess of buttock 11/15/2012   Cluster headaches 06/07/2012   BPH (benign prostatic hyperplasia) 11/22/2010   Abnormal LFTs 11/14/2010   Right inguinal hernia 09/16/2010   Nocturia 08/29/2010   Preventative health care 08/28/2010   FREQUENCY, URINARY 12/24/2009   COPD (chronic obstructive pulmonary disease) (HCC) 05/12/2009   ABNORMAL ELECTROCARDIOGRAM 11/02/2008   Solitary pulmonary nodule 05/14/2008   Cramp of limb 07/19/2007   Diverticulosis of colon 03/07/2007   Hyperlipidemia 10/23/2006   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/23/2006   Essential hypertension 10/23/2006   Unspecified Peripheral Vascular Disease 10/23/2006   Hemorrhoids 10/23/2006   ABSCESS, LUNG 10/23/2006   GERD 10/23/2006    ONSET DATE: 11/03/2022  REFERRING DIAG: E55.9 (ICD-10-CM) - Vitamin D deficiency R26.89 (ICD-10-CM) - Imbalance F10.10 (ICD-10-CM) - Alcohol abuse I63.9 (ICD-10-CM) - Cerebellar stroke (HCC) G62.9 (ICD-10-CM) - Polyneuropathy  THERAPY DIAG:  Unsteadiness on feet  Other abnormalities of gait and  mobility  Rationale for Evaluation and Treatment: Rehabilitation  SUBJECTIVE:                                                                                                                                                                                             SUBJECTIVE STATEMENT: Reports saw Dr. Loleta Chance recently for changes in his balance. But feels less wobbly since he last saw Dr. Loleta Chance, has been doing some more walking. Has a fear of falling with bending over. No falls. Every once in a while will feel lightheaded. Has to take a second to stand up and get his balance. Sometimes uses a walker in the house, but does not use any AD in the house.  Saw Dr. Suszanne Conners about a month ago and reports having a hole in his eardrum. At the end of October, will go back to Dr. Suszanne Conners for a balance test. Feels like he doesn't pick up his feet enough. When walking, feels like he will veer to the L   Pt accompanied by: self  PERTINENT HISTORY: PMH: cerebellar stroke, HTN, HLD, CAD, PVD, left plantar fasciitis, hearing loss, former smoker, neuropathy who presents to neurology clinic with worsening ambulation, hx of right cerebellar stroke.    Per Dr. Loleta Chance: Patient noticed balance problems about a month ago. When he gets up he feels off balance for a few seconds. When he walks, he feels his stance is wider and that he may fall to the left while walking. He has not fallen. He denies any pain. He will sit down for a few minutes and may have some improve. He resets himself. He does not have clear spinning or dizziness. He endorses cramps in his calves, but denies numbness and tingling.   Patient's symptoms of imbalance and impaired ambulation is likely multifactorial with contributions from prior right cerebellar stroke, right inner ear dysfunction, and likely peripheral neuropathy.   PAIN:  Are you having pain? No  Vitals:   11/14/22 0947 11/14/22 0948  BP: 137/73 117/73  Pulse: 66 76    Seated, Standing   PRECAUTIONS: Fall  WEIGHT BEARING RESTRICTIONS: No  FALLS: Has patient fallen in last 6 months? No  LIVING ENVIRONMENT: Lives with: lives with their spouse Lives in: House/apartment Stairs: Yes: Internal: 13 steps; on right going up and External: 2 steps; none Has following equipment at home: Single point cane, Walker - 4 wheeled, and Grab bars  PLOF: Independent  PATIENT GOALS: Improve the balance   OBJECTIVE:   DIAGNOSTIC FINDINGS: Right cerebellar infarct was noted on MRI brain from 04/20/2014 as remote. MRI brain on 09/23/22 showed no new infarct.  Bilateral mastoid effusions were noted.   COGNITION: Overall cognitive status: Within  functional limits for tasks assessed   SENSATION: Light touch: WFL Pt able to detect light touch to ankles, but impaired sensation in toes due to neuropathy   COORDINATION: Heel to shin: WNL bilat    POSTURE: No Significant postural limitations  LOWER EXTREMITY MMT:    MMT Right Eval Left Eval  Hip flexion 5 5  Hip extension    Hip abduction 4+ 4+  Hip adduction 4+ 4+  Hip internal rotation    Hip external rotation    Knee flexion 4+ 5  Knee extension 5 5  Ankle dorsiflexion 5 5  Ankle plantarflexion    Ankle inversion    Ankle eversion    (Blank rows = not tested)  All tested in sitting    TRANSFERS: Assistive device utilized: None  Sit to stand: CGA/SBA Stand to sit: SBA SBA when performing with BUE support  CGA with no UE support to stand, performs with wide BOS  GAIT: Gait pattern: step through pattern, decreased trunk rotation, wide BOS, and poor foot clearance- Right Distance walked: Clinic distances  Assistive device utilized: None Level of assistance: SBA Comments: Pt with one instance of getting R toe caught, but able to keep his balance. Pt reports that this is what happens to him at home.   FUNCTIONAL TESTS:  5 times sit to stand: 16.7 seconds with BUE support  10 meter walk test: 10.75 seconds = 3.05 ft/sec    M-CTSIB  Condition 1: Firm Surface, EO 30 Sec, Normal Sway  Condition 2: Firm Surface, EC 30 Sec, Mild Sway  Condition 3: Foam Surface, EO 30 Sec, Mild Sway  Condition 4: Foam Surface, EC 9.5 Sec      [facility] PT Assessment - 11/14/22 1006       Functional Gait  Assessment   Gait assessed  Yes    Gait Level Surface Walks 20 ft in less than 7 sec but greater than 5.5 sec, uses assistive device, slower speed, mild gait deviations, or deviates 6-10 in outside of the 12 in walkway width.   6.1   Change in Gait Speed Able to smoothly change walking speed without loss of balance or gait deviation. Deviate no more than 6 in outside of the 12  in walkway width.    Gait with Horizontal Head Turns Performs head turns smoothly with slight change in gait velocity (eg, minor disruption to smooth gait path), deviates 6-10 in outside 12 in walkway width, or uses an assistive device.    Gait with Vertical Head Turns Performs task with slight change in gait velocity (eg, minor disruption to smooth gait path), deviates 6 - 10 in outside 12 in walkway width or uses assistive device    Gait and Pivot Turn Pivot turns safely within 3 sec and stops quickly with no loss of balance.    Step Over Obstacle Is able to step over 2 stacked shoe boxes taped together (9 in total height) without changing gait speed. No evidence of imbalance.    Gait with Narrow Base of Support Ambulates 7-9 steps.    Gait with Eyes Closed Walks 20 ft, uses assistive device, slower speed, mild gait deviations, deviates 6-10 in outside 12 in walkway width. Ambulates 20 ft in less than 9 sec but greater than 7 sec.   7.37   Ambulating Backwards Walks 20 ft, uses assistive device, slower speed, mild gait deviations, deviates 6-10 in outside 12  in walkway width.   12   Steps Alternating feet, must use rail.    Total Score 23    FGA comment: 23/30 = Moderate Fall Risk              TODAY'S TREATMENT:                                                                                                                              N/A during eval    PATIENT EDUCATION: Education details: Clinical findings, POC, discussed potential orthostatics as pt with a 20 mmHg drop in systolic value from sitting > standing. Discussed that this could be contributing to pt's unsteadiness/lightheadedness in standing. Discussed drinking plenty of water, taking time with transitions, and could perform seated ankle pumps/marching prior to standing. Will continue to monitor in future sessions  Person educated: Patient Education method: Explanation, Demonstration, and Verbal cues Education comprehension:  verbalized understanding  HOME EXERCISE PROGRAM: Will provide at future session   GOALS: Goals reviewed with patient? Yes  SHORT TERM GOALS: ALL STGS = LTGS  LONG TERM GOALS: Target date: 12/12/2022  Pt will be independent with final HEP in order to build upon functional gains made in therapy. Baseline:  Goal status: INITIAL  2.  Pt will improve FGA to at least a 26/30 in order to demo decr fall risk.  Baseline: 23/30 Goal status: INITIAL  3.  Pt will improve 5x sit<>stand to less than or equal to 14.5 sec to demonstrate improved functional strength and transfer efficiency.  Baseline: 16.7 seconds with BUE support Goal status: INITIAL  4.  Pt will improve condition 4 of mCTSIB to at least 20 seconds to demo improved vestibular input for balance.  Baseline: 9.5 seconds  Goal status: INITIAL    ASSESSMENT:  CLINICAL IMPRESSION: Patient is a 72 year old male referred to Neuro [facility] for imbalance, hx of CVA, polyneuropathy.   Pt's PMH is significant for: cerebellar stroke, HTN, HLD, CAD, PVD, left plantar fasciitis, hearing loss, former smoker, neuropathy . The following deficits were present during the exam: impaired balance, decr functional strength, gait abnormalities. Pt only able to hold condition 4 of mCTSIB for 9.5 seconds, indicating significantly impaired vestibular input for balance. Pt also with a drop in systolic BP, which could be contributing to lightheadedness in standing. Will further monitor in session. Based on FGA, pt is a moderate risk for falls. Pt would benefit from skilled PT to address these impairments and functional limitations to maximize functional mobility independence and decr fall risk.    OBJECTIVE IMPAIRMENTS: Abnormal gait, decreased activity tolerance, decreased balance, decreased mobility, decreased strength, dizziness, and impaired sensation.   ACTIVITY LIMITATIONS: carrying, bending, stairs, transfers, and locomotion level  PARTICIPATION  LIMITATIONS: community activity  PERSONAL FACTORS: Age, Past/current experiences, Time since onset of injury/illness/exacerbation, and 3+ comorbidities: cerebellar stroke, HTN, HLD, CAD, PVD, left plantar fasciitis, hearing loss, former smoker, neuropathy   are also affecting patient's  functional outcome.   REHAB POTENTIAL: Good  CLINICAL DECISION MAKING: Evolving/moderate complexity  EVALUATION COMPLEXITY: Moderate  PLAN:  PT FREQUENCY: 2x/week  PT DURATION: 8 weeks - due to delay in scheduling, just predict 4 weeks   PLANNED INTERVENTIONS: Therapeutic exercises, Therapeutic activity, Neuromuscular re-education, Balance training, Gait training, Patient/Family education, Self Care, Joint mobilization, Stair training, Vestibular training, DME instructions, and Re-evaluation  PLAN FOR NEXT SESSION: briefly assess for hypofunction? Initiate HEP for balance with head motions, narrow BOS, unlevel surfaces, and EC. Trial foot up brace     Drake Leach, PT 11/14/2022, 11:25 AM

## 2022-11-20 ENCOUNTER — Ambulatory Visit: Payer: Medicare Other | Admitting: Physical Therapy

## 2022-11-22 ENCOUNTER — Ambulatory Visit: Payer: Medicare Other | Attending: Neurology | Admitting: Physical Therapy

## 2022-11-22 VITALS — BP 130/76 | HR 74

## 2022-11-22 DIAGNOSIS — R2689 Other abnormalities of gait and mobility: Secondary | ICD-10-CM | POA: Diagnosis present

## 2022-11-22 DIAGNOSIS — R2681 Unsteadiness on feet: Secondary | ICD-10-CM | POA: Insufficient documentation

## 2022-11-22 NOTE — Therapy (Signed)
OUTPATIENT PHYSICAL THERAPY NEURO TREATMENT   Patient Name: Charles [name] MRN: [id number] DOB:08/06/1950, 72 y.o., male Today's Date: 11/22/2022   PCP: Corwin Levins, MD   REFERRING PROVIDER:  Antony Madura, MD  This entire session was performed under the direct supervision and direction of a licensed physical therapist. I have personally read, edited and approve of the note as written.  Sherlie Ban, PT, DPT 11/22/22 1:40 PM      END OF SESSION:  PT End of Session - 11/22/22 1234     Visit Number 2    Number of Visits 9    Date for PT Re-Evaluation 01/13/23    Authorization Type Medicare    PT Start Time 1233    PT Stop Time 1316    PT Time Calculation (min) 43 min    Equipment Utilized During Treatment Gait belt    Activity Tolerance Patient tolerated treatment well    Behavior During Therapy WFL for tasks assessed/performed             Past Medical History:  Diagnosis Date   ABNORMAL ELECTROCARDIOGRAM 11/02/2008   ABSCESS, LUNG 10/23/2006   Anemia 05/26/2014   BACTERIAL PNEUMONIA 12/28/2009   BPH (benign prostatic hyperplasia) 11/22/2010   Cerebellar stroke (HCC) 05/26/2014   CHEST PAIN 12/24/2009   Coronary artery calcification seen on CT scan 06/01/2015   Cramp of limb 07/19/2007   DIVERTICULOSIS, COLON 03/07/2007   DVT (deep venous thrombosis) (HCC) 01/2014   LLE   Emphysema of lung (HCC)    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/23/2006   FLANK PAIN, LEFT 02/25/2010   FREQUENCY, URINARY 12/24/2009   HEMORRHOIDS 10/23/2006   HYPERLIPIDEMIA 10/23/2006   HYPERTENSION 10/23/2006   PE (pulmonary embolism) 02/2014   PLANTAR FASCIITIS, LEFT 11/02/2008   Pneumonia 12/2013 X 2   PSA, INCREASED 11/20/2007   PULMONARY NODULE 05/14/2008   Unspecified Peripheral Vascular Disease 10/23/2006   Past Surgical History:  Procedure Laterality Date   BIOPSY  09/29/2019   Procedure: BIOPSY;  Surgeon: Beverley Fiedler, MD;  Location: [facility];  Service: Endoscopy;;   BIOPSY  12/25/2019    Procedure: BIOPSY;  Surgeon: Rachael Fee, MD;  Location: [facility];  Service: Endoscopy;;   COLONOSCOPY     ESOPHAGOGASTRODUODENOSCOPY (EGD) WITH PROPOFOL N/A 06/15/2017   Procedure: ESOPHAGOGASTRODUODENOSCOPY (EGD) WITH PROPOFOL;  Surgeon: Beverley Fiedler, MD;  Location: [facility];  Service: Gastroenterology;  Laterality: N/A;   ESOPHAGOGASTRODUODENOSCOPY (EGD) WITH PROPOFOL N/A 09/29/2019   Procedure: ESOPHAGOGASTRODUODENOSCOPY (EGD) WITH PROPOFOL;  Surgeon: Beverley Fiedler, MD;  Location: [facility];  Service: Endoscopy;  Laterality: N/A;   ESOPHAGOGASTRODUODENOSCOPY (EGD) WITH PROPOFOL N/A 12/25/2019   Procedure: ESOPHAGOGASTRODUODENOSCOPY (EGD) WITH PROPOFOL;  Surgeon: Rachael Fee, MD;  Location: [facility];  Service: Endoscopy;  Laterality: N/A;   EUS N/A 12/25/2019   Procedure: UPPER ENDOSCOPIC ULTRASOUND (EUS) RADIAL;  Surgeon: Rachael Fee, MD;  Location: [facility];  Service: Endoscopy;  Laterality: N/A;   HEMORRHOID SURGERY  ?1990's   INGUINAL HERNIA REPAIR Bilateral ?2000's   IVC FILTER INSERTION N/A 10/03/2016   Procedure: IVC Filter Retrieveal;  Surgeon: Nada Libman, MD;  Location: [facility];  Service: Cardiovascular;  Laterality: N/A;   MYRINGOTOMY WITH TUBE PLACEMENT Right 06/26/2018   SHOULDER ARTHROSCOPY W/ ROTATOR CUFF REPAIR Right 11/2013   STAPLING OF BLEBS Right 07/31/2016   Procedure: BLEBECTOMY;  Surgeon: Loreli Slot, MD;  Location: [facility] OR;  Service: Thoracic;  Laterality: Right;   VENA CAVA  FILTER PLACEMENT Right 08/04/2016   Procedure: INSERTION VENA-CAVA FILTER;  Surgeon: Loreli Slot, MD;  Location: [facility] OR;  Service: Thoracic;  Laterality: Right;   VIDEO ASSISTED THORACOSCOPY Right 07/31/2016   Procedure: VIDEO ASSISTED THORACOSCOPY;  Surgeon: Loreli Slot, MD;  Location: [facility] OR;  Service: Thoracic;  Laterality: Right;   VIDEO ASSISTED THORACOSCOPY Right 08/04/2016   Procedure: REDO VIDEO ASSISTED  THORACOSCOPY- EVACUATION OF HEMOTHORAX;  Surgeon: Loreli Slot, MD;  Location: [facility] OR;  Service: Thoracic;  Laterality: Right;   VIDEO BRONCHOSCOPY WITH ENDOBRONCHIAL NAVIGATION N/A 03/11/2014   Procedure: VIDEO BRONCHOSCOPY WITH ENDOBRONCHIAL NAVIGATION;  Surgeon: Leslye Peer, MD;  Location: [facility] OR;  Service: Thoracic;  Laterality: N/A;   Patient Active Problem List   Diagnosis Date Noted   Elevated PSA 09/24/2022   Right ear pain 09/24/2022   RUQ pain 03/27/2022   BPH associated with nocturia 09/24/2021   Arm paresthesia, left 07/10/2021   Aortic atherosclerosis (HCC) 09/08/2020   Chronic anticoagulation 12/27/2019   Acute upper GI bleed 09/30/2019   Hiatal hernia    Gastritis and gastroduodenitis    Duodenal nodule    Coagulopathy (HCC) 09/28/2019   Hypokalemia 09/06/2019   Dysphagia 09/05/2019   Multiple contusions 12/11/2018   Vitamin D deficiency 12/11/2018   History of DVT (deep vein thrombosis) 11/25/2018   Primary osteoarthritis of left knee 02/01/2018   Insomnia 07/13/2017   ATN (acute tubular necrosis) (HCC)    Melena    Balance problems 06/13/2017   Acute hearing loss, right 06/13/2017   Dehydration 06/13/2017   Symptomatic anemia 06/13/2017   Hx of pulmonary embolus 06/13/2017   Thrombocytopenia (HCC) 06/13/2017   Alcohol abuse 06/13/2017   AKI (acute kidney injury) (HCC) 06/13/2017   Lung blebs (HCC) 07/31/2016   Acute DVT (deep venous thrombosis) (HCC) 07/25/2016   Pneumothorax on right 07/23/2016   CKD (chronic kidney disease), stage III (HCC) 07/23/2016   Gross hematuria 04/25/2016   Dysfunction of both eustachian tubes 10/06/2015   UTI (urinary tract infection) 08/26/2015   Bilateral hearing loss 08/26/2015   Coronary artery calcification seen on CT scan 06/01/2015   Low back pain 06/01/2015   Hyperglycemia 12/18/2014   Anemia 05/26/2014   Cerebellar stroke (HCC) 05/26/2014   Brain contusion (HCC) 04/23/2014   Lung nodule 03/09/2014    Acute pulmonary embolism (HCC) 02/18/2014   Multiple nodules of lung 02/18/2014   Cough 12/24/2013   Allergic rhinitis 12/24/2013   Other chest pain 12/24/2013   Increased prostate specific antigen (PSA) velocity 11/20/2013   Eustachian tube dysfunction 11/20/2013   Hearing loss on right 11/20/2013   Serous otitis media 11/15/2013   Muscle tightness 10/02/2013   Rotator cuff injury 09/17/2013   Skin lesion 07/25/2013   Anal pain 12/18/2012   Habitual alcohol use 11/17/2012   Cellulitis and abscess of buttock 11/15/2012   Cluster headaches 06/07/2012   BPH (benign prostatic hyperplasia) 11/22/2010   Abnormal LFTs 11/14/2010   Right inguinal hernia 09/16/2010   Nocturia 08/29/2010   Preventative health care 08/28/2010   FREQUENCY, URINARY 12/24/2009   COPD (chronic obstructive pulmonary disease) (HCC) 05/12/2009   ABNORMAL ELECTROCARDIOGRAM 11/02/2008   Solitary pulmonary nodule 05/14/2008   Cramp of limb 07/19/2007   Diverticulosis of colon 03/07/2007   Hyperlipidemia 10/23/2006   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/23/2006   Essential hypertension 10/23/2006   Unspecified Peripheral Vascular Disease 10/23/2006   Hemorrhoids 10/23/2006   Abscess of lung (HCC) 10/23/2006   GERD 10/23/2006    ONSET  DATE: 11/03/2022  REFERRING DIAG: E55.9 (ICD-10-CM) - Vitamin D deficiency R26.89 (ICD-10-CM) - Imbalance F10.10 (ICD-10-CM) - Alcohol abuse I63.9 (ICD-10-CM) - Cerebellar stroke (HCC) G62.9 (ICD-10-CM) - Polyneuropathy  THERAPY DIAG:  Unsteadiness on feet  Other abnormalities of gait and mobility  Rationale for Evaluation and Treatment: Rehabilitation  SUBJECTIVE:                                                                                                                                                                                             SUBJECTIVE STATEMENT: Patient denies falls or near falls and any other acute changes. Reports feeling well.  Pt accompanied by:  self  PERTINENT HISTORY: PMH: cerebellar stroke, HTN, HLD, CAD, PVD, left plantar fasciitis, hearing loss, former smoker, neuropathy who presents to neurology clinic with worsening ambulation, hx of right cerebellar stroke.    Per Dr. Loleta Chance: Patient noticed balance problems about a month ago. When he gets up he feels off balance for a few seconds. When he walks, he feels his stance is wider and that he may fall to the left while walking. He has not fallen. He denies any pain. He will sit down for a few minutes and may have some improve. He resets himself. He does not have clear spinning or dizziness. He endorses cramps in his calves, but denies numbness and tingling.   Patient's symptoms of imbalance and impaired ambulation is likely multifactorial with contributions from prior right cerebellar stroke, right inner ear dysfunction, and likely peripheral neuropathy.   PAIN:  Are you having pain? No  PRECAUTIONS: Fall  WEIGHT BEARING RESTRICTIONS: No  FALLS: Has patient fallen in last 6 months? No  LIVING ENVIRONMENT: Lives with: lives with their spouse Lives in: House/apartment Stairs: Yes: Internal: 13 steps; on right going up and External: 2 steps; none Has following equipment at home: Single point cane, Walker - 4 wheeled, and Grab bars  PLOF: Independent  PATIENT GOALS: Improve the balance   OBJECTIVE:   DIAGNOSTIC FINDINGS: Right cerebellar infarct was noted on MRI brain from 04/20/2014 as remote. MRI brain on 09/23/22 showed no new infarct. Bilateral mastoid effusions were noted.   COGNITION: Overall cognitive status: Within functional limits for tasks assessed   SENSATION: Light touch: WFL Pt able to detect light touch to ankles, but impaired sensation in toes due to neuropathy   COORDINATION: Heel to shin: WNL bilat    POSTURE: No Significant postural limitations  LOWER EXTREMITY MMT:    MMT Right Eval Left Eval  Hip flexion 5 5  Hip extension    Hip abduction 4+  4+  Hip adduction 4+  4+  Hip internal rotation    Hip external rotation    Knee flexion 4+ 5  Knee extension 5 5  Ankle dorsiflexion 5 5  Ankle plantarflexion    Ankle inversion    Ankle eversion    (Blank rows = not tested)  All tested in sitting    TRANSFERS: Assistive device utilized: None  Sit to stand: CGA/SBA Stand to sit: SBA SBA when performing with BUE support  CGA with no UE support to stand, performs with wide BOS  GAIT: Gait pattern: step through pattern, decreased trunk rotation, wide BOS, and poor foot clearance- Right Distance walked: Clinic distances  Assistive device utilized: None Level of assistance: SBA Comments: Pt with one instance of getting R toe caught, but able to keep his balance. Pt reports that this is what happens to him at home.   FUNCTIONAL TESTS:  5 times sit to stand: 16.7 seconds with BUE support  10 meter walk test: 10.75 seconds = 3.05 ft/sec    M-CTSIB  Condition 1: Firm Surface, EO 30 Sec, Normal Sway  Condition 2: Firm Surface, EC 30 Sec, Mild Sway  Condition 3: Foam Surface, EO 30 Sec, Mild Sway  Condition 4: Foam Surface, EC 9.5 Sec         TODAY'S TREATMENT:                                                                                                                              TherAct Vitals:   11/22/22 1237  BP: 130/76  Pulse: 74  Seated, LUE   Muscle Length Dorsiflexion in standing 10/2 Left Right  Soleus 12* 10*  Gastroc 12* 8*   TherEx Standing Gastroc and Soleus Stretch at wall for balance x2 repetitions x30 second holds bilaterally  Sidelying Clamshells 2 sets x10 reps bilaterally  Patient reports more difficult on L side than R Educated to put a hand on lateral hip to keep hips rocked forwards and feel glute med contraction  NMR in Balance Corner, Gait belt donned for safety Romberg stance on foam EO x30 seconds Patient reports as easy  Tandem stance on foam EO x30 seconds bilaterally  Tandem  stance on solid surface EC up to x30 seconds in total (1 second to 15 second holds) bilaterally Patient moves far outside of base with medial/lateral sway, tapping wall on R for corrective tactile cue Patient reliant on BUEs touching chair in front for balance corrections  Staggered stance on solid surface EC x30 seconds bilaterally Patient reports as easy after finding footing   PATIENT EDUCATION: Education details: HEP (bolded below), potential to try foot-up next session for R toe catching Person educated: Patient Education method: Explanation, Demonstration, Tactile cues, Verbal cues, and Handouts Education comprehension: verbalized understanding, returned demonstration, verbal cues required, tactile cues required, and needs further education  HOME EXERCISE PROGRAM: Access Code: [email or web address] URL: [email or web address] Date: 11/22/2022 Prepared by: Sherlie Ban  Exercises - Gastroc Stretch on  Wall  - 2 x daily - 7 x weekly - 1 sets - 3 reps - 30-60 second  hold - Soleus Stretch on Wall  - 2 x daily - 7 x weekly - 1 sets - 3 reps - 30-60 second hold - Clamshell  - 1 x daily - 4 x weekly - 3 sets - 10 reps - Half Tandem Stance Balance with Eyes Closed  - 1 x daily - 7 x weekly - 1 sets - 3 reps - 30 second hold  GOALS: Goals reviewed with patient? Yes  SHORT TERM GOALS: ALL STGS = LTGS  LONG TERM GOALS: Target date: 12/12/2022  Pt will be independent with final HEP in order to build upon functional gains made in therapy. Baseline:  Goal status: INITIAL  2.  Pt will improve FGA to at least a 26/30 in order to demo decr fall risk.  Baseline: 23/30 Goal status: INITIAL  3.  Pt will improve 5x sit<>stand to less than or equal to 14.5 sec to demonstrate improved functional strength and transfer efficiency.  Baseline: 16.7 seconds with BUE support Goal status: INITIAL  4.  Pt will improve condition 4 of mCTSIB to at least 20 seconds to demo improved vestibular  input for balance.  Baseline: 9.5 seconds  Goal status: INITIAL    ASSESSMENT:  CLINICAL IMPRESSION: Today's skilled physical therapy session emphasis on initiating HEP for mobility, stretching, strengthening, and static balance with narrow stance and eyes closed. Patient with increased difficulty with tandem stance on solid surface with eyes closed, no LOB, does require tactile input to right self. Continue POC.   OBJECTIVE IMPAIRMENTS: Abnormal gait, decreased activity tolerance, decreased balance, decreased mobility, decreased strength, dizziness, and impaired sensation.   ACTIVITY LIMITATIONS: carrying, bending, stairs, transfers, and locomotion level  PARTICIPATION LIMITATIONS: community activity  PERSONAL FACTORS: Age, Past/current experiences, Time since onset of injury/illness/exacerbation, and 3+ comorbidities: cerebellar stroke, HTN, HLD, CAD, PVD, left plantar fasciitis, hearing loss, former smoker, neuropathy   are also affecting patient's functional outcome.   REHAB POTENTIAL: Good  CLINICAL DECISION MAKING: Evolving/moderate complexity  EVALUATION COMPLEXITY: Moderate  PLAN:  PT FREQUENCY: 2x/week  PT DURATION: 8 weeks - due to delay in scheduling, just predict 4 weeks   PLANNED INTERVENTIONS: Therapeutic exercises, Therapeutic activity, Neuromuscular re-education, Balance training, Gait training, Patient/Family education, Self Care, Joint mobilization, Stair training, Vestibular training, DME instructions, and Re-evaluation  PLAN FOR NEXT SESSION: briefly assess for hypofunction? Add to HEP as needed or balance with head motions, narrow BOS, unlevel surfaces, and EC. Trial foot up brace. Continue working on balance.   Beverely Low, SPT Drake Leach, PT, DPT 11/22/2022, 1:40 PM

## 2022-11-23 ENCOUNTER — Ambulatory Visit: Payer: Medicare Other | Admitting: Physical Therapy

## 2022-11-23 ENCOUNTER — Encounter: Payer: Self-pay | Admitting: Physical Therapy

## 2022-11-23 VITALS — BP 156/79 | HR 70

## 2022-11-23 DIAGNOSIS — R2681 Unsteadiness on feet: Secondary | ICD-10-CM

## 2022-11-23 DIAGNOSIS — R2689 Other abnormalities of gait and mobility: Secondary | ICD-10-CM

## 2022-11-23 NOTE — Therapy (Signed)
OUTPATIENT PHYSICAL THERAPY NEURO TREATMENT   Patient Name: Charles [name] MRN: [id number] DOB:[birth year], 72 y.o., male Today's Date: 11/23/2022   PCP: Corwin Levins, MD   REFERRING PROVIDER:  Antony Madura, MD  11/23/22 9:40 AM   END OF SESSION:  PT End of Session - 11/23/22 0851     Visit Number 3    Number of Visits 9    Date for PT Re-Evaluation 01/13/23    Authorization Type Medicare    PT Start Time 0849    PT Stop Time 0930    PT Time Calculation (min) 41 min    Equipment Utilized During Treatment Gait belt    Activity Tolerance Patient tolerated treatment well    Behavior During Therapy WFL for tasks assessed/performed             Past Medical History:  Diagnosis Date   ABNORMAL ELECTROCARDIOGRAM 11/02/2008   ABSCESS, LUNG 10/23/2006   Anemia 05/26/2014   BACTERIAL PNEUMONIA 12/28/2009   BPH (benign prostatic hyperplasia) 11/22/2010   Cerebellar stroke (HCC) 05/26/2014   CHEST PAIN 12/24/2009   Coronary artery calcification seen on CT scan 06/01/2015   Cramp of limb 07/19/2007   DIVERTICULOSIS, COLON 03/07/2007   DVT (deep venous thrombosis) (HCC) 01/2014   LLE   Emphysema of lung (HCC)    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/23/2006   FLANK PAIN, LEFT 02/25/2010   FREQUENCY, URINARY 12/24/2009   HEMORRHOIDS 10/23/2006   HYPERLIPIDEMIA 10/23/2006   HYPERTENSION 10/23/2006   PE (pulmonary embolism) 02/2014   PLANTAR FASCIITIS, LEFT 11/02/2008   Pneumonia 12/2013 X 2   PSA, INCREASED 11/20/2007   PULMONARY NODULE 05/14/2008   Unspecified Peripheral Vascular Disease 10/23/2006   Past Surgical History:  Procedure Laterality Date   BIOPSY  09/29/2019   Procedure: BIOPSY;  Surgeon: Beverley Fiedler, MD;  Location: [facility];  Service: Endoscopy;;   BIOPSY  12/25/2019   Procedure: BIOPSY;  Surgeon: Rachael Fee, MD;  Location: [facility];  Service: Endoscopy;;   COLONOSCOPY     ESOPHAGOGASTRODUODENOSCOPY (EGD) WITH PROPOFOL N/A 06/15/2017   Procedure: ESOPHAGOGASTRODUODENOSCOPY  (EGD) WITH PROPOFOL;  Surgeon: Beverley Fiedler, MD;  Location: [facility];  Service: Gastroenterology;  Laterality: N/A;   ESOPHAGOGASTRODUODENOSCOPY (EGD) WITH PROPOFOL N/A 09/29/2019   Procedure: ESOPHAGOGASTRODUODENOSCOPY (EGD) WITH PROPOFOL;  Surgeon: Beverley Fiedler, MD;  Location: [facility];  Service: Endoscopy;  Laterality: N/A;   ESOPHAGOGASTRODUODENOSCOPY (EGD) WITH PROPOFOL N/A 12/25/2019   Procedure: ESOPHAGOGASTRODUODENOSCOPY (EGD) WITH PROPOFOL;  Surgeon: Rachael Fee, MD;  Location: [facility];  Service: Endoscopy;  Laterality: N/A;   EUS N/A 12/25/2019   Procedure: UPPER ENDOSCOPIC ULTRASOUND (EUS) RADIAL;  Surgeon: Rachael Fee, MD;  Location: [facility];  Service: Endoscopy;  Laterality: N/A;   HEMORRHOID SURGERY  ?1990's   INGUINAL HERNIA REPAIR Bilateral ?2000's   IVC FILTER INSERTION N/A 10/03/2016   Procedure: IVC Filter Retrieveal;  Surgeon: Nada Libman, MD;  Location: [facility];  Service: Cardiovascular;  Laterality: N/A;   MYRINGOTOMY WITH TUBE PLACEMENT Right 06/26/2018   SHOULDER ARTHROSCOPY W/ ROTATOR CUFF REPAIR Right 11/2013   STAPLING OF BLEBS Right 07/31/2016   Procedure: BLEBECTOMY;  Surgeon: Loreli Slot, MD;  Location: [facility] OR;  Service: Thoracic;  Laterality: Right;   VENA CAVA FILTER PLACEMENT Right 08/04/2016   Procedure: INSERTION VENA-CAVA FILTER;  Surgeon: Loreli Slot, MD;  Location: [facility] OR;  Service: Thoracic;  Laterality: Right;   VIDEO ASSISTED THORACOSCOPY Right 07/31/2016   Procedure:  VIDEO ASSISTED THORACOSCOPY;  Surgeon: Loreli Slot, MD;  Location: [facility] OR;  Service: Thoracic;  Laterality: Right;   VIDEO ASSISTED THORACOSCOPY Right 08/04/2016   Procedure: REDO VIDEO ASSISTED THORACOSCOPY- EVACUATION OF HEMOTHORAX;  Surgeon: Loreli Slot, MD;  Location: [facility];  Service: Thoracic;  Laterality: Right;   VIDEO BRONCHOSCOPY WITH ENDOBRONCHIAL NAVIGATION N/A 03/11/2014   Procedure: VIDEO  BRONCHOSCOPY WITH ENDOBRONCHIAL NAVIGATION;  Surgeon: Leslye Peer, MD;  Location: [facility];  Service: Thoracic;  Laterality: N/A;   Patient Active Problem List   Diagnosis Date Noted   Elevated PSA 09/24/2022   Right ear pain 09/24/2022   RUQ pain 03/27/2022   BPH associated with nocturia 09/24/2021   Arm paresthesia, left 07/10/2021   Aortic atherosclerosis (HCC) 09/08/2020   Chronic anticoagulation 12/27/2019   Acute upper GI bleed 09/30/2019   Hiatal hernia    Gastritis and gastroduodenitis    Duodenal nodule    Coagulopathy (HCC) 09/28/2019   Hypokalemia 09/06/2019   Dysphagia 09/05/2019   Multiple contusions 12/11/2018   Vitamin D deficiency 12/11/2018   History of DVT (deep vein thrombosis) 11/25/2018   Primary osteoarthritis of left knee 02/01/2018   Insomnia 07/13/2017   ATN (acute tubular necrosis) (HCC)    Melena    Balance problems 06/13/2017   Acute hearing loss, right 06/13/2017   Dehydration 06/13/2017   Symptomatic anemia 06/13/2017   Hx of pulmonary embolus 06/13/2017   Thrombocytopenia (HCC) 06/13/2017   Alcohol abuse 06/13/2017   AKI (acute kidney injury) (HCC) 06/13/2017   Lung blebs (HCC) 07/31/2016   Acute DVT (deep venous thrombosis) (HCC) 07/25/2016   Pneumothorax on right 07/23/2016   CKD (chronic kidney disease), stage III (HCC) 07/23/2016   Gross hematuria 04/25/2016   Dysfunction of both eustachian tubes 10/06/2015   UTI (urinary tract infection) 08/26/2015   Bilateral hearing loss 08/26/2015   Coronary artery calcification seen on CT scan 06/01/2015   Low back pain 06/01/2015   Hyperglycemia 12/18/2014   Anemia 05/26/2014   Cerebellar stroke (HCC) 05/26/2014   Brain contusion (HCC) 04/23/2014   Lung nodule 03/09/2014   Acute pulmonary embolism (HCC) 02/18/2014   Multiple nodules of lung 02/18/2014   Cough 12/24/2013   Allergic rhinitis 12/24/2013   Other chest pain 12/24/2013   Increased prostate specific antigen (PSA) velocity  11/20/2013   Eustachian tube dysfunction 11/20/2013   Hearing loss on right 11/20/2013   Serous otitis media 11/15/2013   Muscle tightness 10/02/2013   Rotator cuff injury 09/17/2013   Skin lesion 07/25/2013   Anal pain 12/18/2012   Habitual alcohol use 11/17/2012   Cellulitis and abscess of buttock 11/15/2012   Cluster headaches 06/07/2012   BPH (benign prostatic hyperplasia) 11/22/2010   Abnormal LFTs 11/14/2010   Right inguinal hernia 09/16/2010   Nocturia 08/29/2010   Preventative health care 08/28/2010   FREQUENCY, URINARY 12/24/2009   COPD (chronic obstructive pulmonary disease) (HCC) 05/12/2009   ABNORMAL ELECTROCARDIOGRAM 11/02/2008   Solitary pulmonary nodule 05/14/2008   Cramp of limb 07/19/2007   Diverticulosis of colon 03/07/2007   Hyperlipidemia 10/23/2006   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/23/2006   Essential hypertension 10/23/2006   Unspecified Peripheral Vascular Disease 10/23/2006   Hemorrhoids 10/23/2006   Abscess of lung (HCC) 10/23/2006   GERD 10/23/2006    ONSET DATE: 11/03/2022  REFERRING DIAG: E55.9 (ICD-10-CM) - Vitamin D deficiency R26.89 (ICD-10-CM) - Imbalance F10.10 (ICD-10-CM) - Alcohol abuse I63.9 (ICD-10-CM) - Cerebellar stroke (HCC) G62.9 (ICD-10-CM) - Polyneuropathy  THERAPY DIAG:  Unsteadiness on  feet  Other abnormalities of gait and mobility  Rationale for Evaluation and Treatment: Rehabilitation  SUBJECTIVE:                                                                                                                                                                                             SUBJECTIVE STATEMENT: Patient reports exercises are going well. He denies any falls and near falls.   Pt accompanied by: self  PERTINENT HISTORY: PMH: cerebellar stroke, HTN, HLD, CAD, PVD, left plantar fasciitis, hearing loss, former smoker, neuropathy who presents to neurology clinic with worsening ambulation, hx of right cerebellar stroke.    Per  Dr. Loleta Chance: Patient noticed balance problems about a month ago. When he gets up he feels off balance for a few seconds. When he walks, he feels his stance is wider and that he may fall to the left while walking. He has not fallen. He denies any pain. He will sit down for a few minutes and may have some improve. He resets himself. He does not have clear spinning or dizziness. He endorses cramps in his calves, but denies numbness and tingling.   Patient's symptoms of imbalance and impaired ambulation is likely multifactorial with contributions from prior right cerebellar stroke, right inner ear dysfunction, and likely peripheral neuropathy.   PAIN:  Are you having pain? No  PRECAUTIONS: Fall  WEIGHT BEARING RESTRICTIONS: No  FALLS: Has patient fallen in last 6 months? No  LIVING ENVIRONMENT: Lives with: lives with their spouse Lives in: House/apartment Stairs: Yes: Internal: 13 steps; on right going up and External: 2 steps; none Has following equipment at home: Single point cane, Walker - 4 wheeled, and Grab bars  PLOF: Independent  PATIENT GOALS: Improve the balance   OBJECTIVE:   DIAGNOSTIC FINDINGS: Right cerebellar infarct was noted on MRI brain from 04/20/2014 as remote. MRI brain on 09/23/22 showed no new infarct. Bilateral mastoid effusions were noted.   COGNITION: Overall cognitive status: Within functional limits for tasks assessed   SENSATION: Light touch: WFL Pt able to detect light touch to ankles, but impaired sensation in toes due to neuropathy   COORDINATION: Heel to shin: WNL bilat    POSTURE: No Significant postural limitations  LOWER EXTREMITY MMT:    MMT Right Eval Left Eval  Hip flexion 5 5  Hip extension    Hip abduction 4+ 4+  Hip adduction 4+ 4+  Hip internal rotation    Hip external rotation    Knee flexion 4+ 5  Knee extension 5 5  Ankle dorsiflexion 5 5  Ankle plantarflexion    Ankle inversion  Ankle eversion    (Blank rows = not  tested)  All tested in sitting    TRANSFERS: Assistive device utilized: None  Sit to stand: CGA/SBA Stand to sit: SBA SBA when performing with BUE support  CGA with no UE support to stand, performs with wide BOS  GAIT: Gait pattern: step through pattern, decreased trunk rotation, wide BOS, and poor foot clearance- Right Distance walked: Clinic distances  Assistive device utilized: None Level of assistance: SBA Comments: Pt with one instance of getting R toe caught, but able to keep his balance. Pt reports that this is what happens to him at home.   FUNCTIONAL TESTS:  5 times sit to stand: 16.7 seconds with BUE support  10 meter walk test: 10.75 seconds = 3.05 ft/sec    M-CTSIB  Condition 1: Firm Surface, EO 30 Sec, Normal Sway  Condition 2: Firm Surface, EC 30 Sec, Mild Sway  Condition 3: Foam Surface, EO 30 Sec, Mild Sway  Condition 4: Foam Surface, EC 9.5 Sec         TODAY'S TREATMENT:                                                                                                                              TherAct Vitals:   11/23/22 0854  BP: (!) 156/79  Pulse: 70   Seated, RUE  VESTIBULAR ASSESSMENT:  NMR:  GENERAL OBSERVATION: wears glasses for distance reading  OCULOMOTOR EXAM:  Ocular Alignment:  ptosis on L eye  Ocular ROM: No Limitations  Spontaneous Nystagmus: absent  Gaze-Induced Nystagmus: absent  Smooth Pursuits: intact  Saccades: slow with vertical eye movements  Convergence/Divergence: < 5 cm   Test of Skew: WNL   Cervical ROM: WFL    VESTIBULAR - OCULAR REFLEX:   Slow VOR: Normal  VOR Cancellation: Normal - some cervical guarding which may have impacted results  Head-Impulse Test: HIT Right: negative HIT Left: mild correction but grossly WFL testing limited bilaterally due to cervical guarding  Dynamic Visual Acuity: Static: Line 9 Dynamic: Line 8 1 Line difference but limited by cervical guarding   Between // bars with mix of  SBA and minA with LOB as needed EC on foam narrowing BOS from WBOS to NBOS 4 x 10-30" (improved ease on final round NBOS after progression) Impacted by neuropathy  Gait:  GAIT: Gait pattern: step through pattern, decreased trunk rotation, wide BOS, and poor foot clearance- Right and left Distance walked: Clinic distances, 1 x 115 Assistive device utilized: None Level of assistance: SBA Comments: a few episodes of scuffing but able to correct appropriately throughout session   GAIT: Gait pattern: step through pattern, decreased trunk rotation, wide BOS, and poor foot clearance- Right Distance walked: Clinic distances  Assistive device utilized: Foot up brace on RLE (as area of greatest deficit noted last session) Level of assistance: SBA Comments: Patient continues to demonstrate scuffing bilaterally, minimal improvement with use of foot up brace;  patient reports minimal improvement and prefers gait without as well  PATIENT EDUCATION: Education details: Continue HEP + Foot up brace results Person educated: Patient Education method: Explanation, Demonstration, Tactile cues, Verbal cues, and Handouts Education comprehension: verbalized understanding, returned demonstration, verbal cues required, tactile cues required, and needs further education   HOME EXERCISE PROGRAM: Access Code: [email or web address] URL: [email or web address] Date: 11/22/2022 Prepared by: Sherlie Ban  Exercises - Gastroc Stretch on Wall  - 2 x daily - 7 x weekly - 1 sets - 3 reps - 30-60 second  hold - Soleus Stretch on Wall  - 2 x daily - 7 x weekly - 1 sets - 3 reps - 30-60 second hold - Clamshell  - 1 x daily - 4 x weekly - 3 sets - 10 reps - Half Tandem Stance Balance with Eyes Closed  - 1 x daily - 7 x weekly - 1 sets - 3 reps - 30 second hold  GOALS: Goals reviewed with patient? Yes  SHORT TERM GOALS: ALL STGS = LTGS  LONG TERM GOALS: Target date: 12/12/2022  Pt will be independent with  final HEP in order to build upon functional gains made in therapy. Baseline:  Goal status: INITIAL  2.  Pt will improve FGA to at least a 26/30 in order to demo decr fall risk.  Baseline: 23/30 Goal status: INITIAL  3.  Pt will improve 5x sit<>stand to less than or equal to 14.5 sec to demonstrate improved functional strength and transfer efficiency.  Baseline: 16.7 seconds with BUE support Goal status: INITIAL  4.  Pt will improve condition 4 of mCTSIB to at least 20 seconds to demo improved vestibular input for balance.  Baseline: 9.5 seconds  Goal status: INITIAL    ASSESSMENT:  CLINICAL IMPRESSION: Today's skilled physical therapy session emphasis further vestibular assessment, foot up brace trial, and balance reaction on compliant surface. Patient vestibular assessment grossly WFL. No hypofunction noted in today's assessment; however, testing limited by extent of cervical guarding with testing minimizing accuracy of HIT. Trialed foot up brace on RLE; however, demonstrated minimal improvement in gait and would not recommend at this time. Finished with balance on compliant surface; patient benefitted from starting with WBOS of support and narrowing BOS to improve practice with balance strategies. Continue POC.   OBJECTIVE IMPAIRMENTS: Abnormal gait, decreased activity tolerance, decreased balance, decreased mobility, decreased strength, dizziness, and impaired sensation.   ACTIVITY LIMITATIONS: carrying, bending, stairs, transfers, and locomotion level  PARTICIPATION LIMITATIONS: community activity  PERSONAL FACTORS: Age, Past/current experiences, Time since onset of injury/illness/exacerbation, and 3+ comorbidities: cerebellar stroke, HTN, HLD, CAD, PVD, left plantar fasciitis, hearing loss, former smoker, neuropathy   are also affecting patient's functional outcome.   REHAB POTENTIAL: Good  CLINICAL DECISION MAKING: Evolving/moderate complexity  EVALUATION COMPLEXITY:  Moderate  PLAN:  PT FREQUENCY: 2x/week  PT DURATION: 8 weeks - due to delay in scheduling, just predict 4 weeks   PLANNED INTERVENTIONS: Therapeutic exercises, Therapeutic activity, Neuromuscular re-education, Balance training, Gait training, Patient/Family education, Self Care, Joint mobilization, Stair training, Vestibular training, DME instructions, and Re-evaluation  PLAN FOR NEXT SESSION: Add to HEP as needed or balance with head motions, narrow BOS, unlevel surfaces, and EC. Work on stepping reactions with dynamic episodes of stability, resisted gait with perturbations  Carmelia Bake, PT, DPT 11/23/2022, 9:40 AM

## 2022-11-27 ENCOUNTER — Encounter: Payer: Self-pay | Admitting: Physical Therapy

## 2022-11-27 ENCOUNTER — Ambulatory Visit: Payer: Medicare Other | Admitting: Physical Therapy

## 2022-11-27 VITALS — BP 138/74 | HR 69

## 2022-11-27 DIAGNOSIS — R2689 Other abnormalities of gait and mobility: Secondary | ICD-10-CM

## 2022-11-27 DIAGNOSIS — R2681 Unsteadiness on feet: Secondary | ICD-10-CM | POA: Diagnosis not present

## 2022-11-27 NOTE — Therapy (Signed)
OUTPATIENT PHYSICAL THERAPY NEURO TREATMENT   Patient Name: Charles [name] MRN: [id number] DOB:01/01/1951, 72 y.o., male Today's Date: 11/27/2022   PCP: Corwin Levins, MD   REFERRING PROVIDER:  Antony Madura, MD  11/27/22 10:30 AM   END OF SESSION:  PT End of Session - 11/27/22 0849     Visit Number 4    Number of Visits 9    Date for PT Re-Evaluation 01/13/23    Authorization Type Medicare    PT Start Time 0848    PT Stop Time 0928    PT Time Calculation (min) 40 min    Equipment Utilized During Treatment Gait belt    Activity Tolerance Patient tolerated treatment well    Behavior During Therapy WFL for tasks assessed/performed             Past Medical History:  Diagnosis Date   ABNORMAL ELECTROCARDIOGRAM 11/02/2008   ABSCESS, LUNG 10/23/2006   Anemia 05/26/2014   BACTERIAL PNEUMONIA 12/28/2009   BPH (benign prostatic hyperplasia) 11/22/2010   Cerebellar stroke (HCC) 05/26/2014   CHEST PAIN 12/24/2009   Coronary artery calcification seen on CT scan 06/01/2015   Cramp of limb 07/19/2007   DIVERTICULOSIS, COLON 03/07/2007   DVT (deep venous thrombosis) (HCC) 01/2014   LLE   Emphysema of lung (HCC)    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/23/2006   FLANK PAIN, LEFT 02/25/2010   FREQUENCY, URINARY 12/24/2009   HEMORRHOIDS 10/23/2006   HYPERLIPIDEMIA 10/23/2006   HYPERTENSION 10/23/2006   PE (pulmonary embolism) 02/2014   PLANTAR FASCIITIS, LEFT 11/02/2008   Pneumonia 12/2013 X 2   PSA, INCREASED 11/20/2007   PULMONARY NODULE 05/14/2008   Unspecified Peripheral Vascular Disease 10/23/2006   Past Surgical History:  Procedure Laterality Date   BIOPSY  09/29/2019   Procedure: BIOPSY;  Surgeon: Beverley Fiedler, MD;  Location: [facility];  Service: Endoscopy;;   BIOPSY  12/25/2019   Procedure: BIOPSY;  Surgeon: Rachael Fee, MD;  Location: [facility];  Service: Endoscopy;;   COLONOSCOPY     ESOPHAGOGASTRODUODENOSCOPY (EGD) WITH PROPOFOL N/A 06/15/2017   Procedure: ESOPHAGOGASTRODUODENOSCOPY  (EGD) WITH PROPOFOL;  Surgeon: Beverley Fiedler, MD;  Location: [facility];  Service: Gastroenterology;  Laterality: N/A;   ESOPHAGOGASTRODUODENOSCOPY (EGD) WITH PROPOFOL N/A 09/29/2019   Procedure: ESOPHAGOGASTRODUODENOSCOPY (EGD) WITH PROPOFOL;  Surgeon: Beverley Fiedler, MD;  Location: [facility];  Service: Endoscopy;  Laterality: N/A;   ESOPHAGOGASTRODUODENOSCOPY (EGD) WITH PROPOFOL N/A 12/25/2019   Procedure: ESOPHAGOGASTRODUODENOSCOPY (EGD) WITH PROPOFOL;  Surgeon: Rachael Fee, MD;  Location: [facility];  Service: Endoscopy;  Laterality: N/A;   EUS N/A 12/25/2019   Procedure: UPPER ENDOSCOPIC ULTRASOUND (EUS) RADIAL;  Surgeon: Rachael Fee, MD;  Location: [facility];  Service: Endoscopy;  Laterality: N/A;   HEMORRHOID SURGERY  ?1990's   INGUINAL HERNIA REPAIR Bilateral ?2000's   IVC FILTER INSERTION N/A 10/03/2016   Procedure: IVC Filter Retrieveal;  Surgeon: Nada Libman, MD;  Location: [facility];  Service: Cardiovascular;  Laterality: N/A;   MYRINGOTOMY WITH TUBE PLACEMENT Right 06/26/2018   SHOULDER ARTHROSCOPY W/ ROTATOR CUFF REPAIR Right 11/2013   STAPLING OF BLEBS Right 07/31/2016   Procedure: BLEBECTOMY;  Surgeon: Loreli Slot, MD;  Location: [facility] OR;  Service: Thoracic;  Laterality: Right;   VENA CAVA FILTER PLACEMENT Right 08/04/2016   Procedure: INSERTION VENA-CAVA FILTER;  Surgeon: Loreli Slot, MD;  Location: [facility] OR;  Service: Thoracic;  Laterality: Right;   VIDEO ASSISTED THORACOSCOPY Right 07/31/2016   Procedure:  VIDEO ASSISTED THORACOSCOPY;  Surgeon: Loreli Slot, MD;  Location: [facility] OR;  Service: Thoracic;  Laterality: Right;   VIDEO ASSISTED THORACOSCOPY Right 08/04/2016   Procedure: REDO VIDEO ASSISTED THORACOSCOPY- EVACUATION OF HEMOTHORAX;  Surgeon: Loreli Slot, MD;  Location: [facility] OR;  Service: Thoracic;  Laterality: Right;   VIDEO BRONCHOSCOPY WITH ENDOBRONCHIAL NAVIGATION N/A 03/11/2014   Procedure: VIDEO  BRONCHOSCOPY WITH ENDOBRONCHIAL NAVIGATION;  Surgeon: Leslye Peer, MD;  Location: [facility] OR;  Service: Thoracic;  Laterality: N/A;   Patient Active Problem List   Diagnosis Date Noted   Elevated PSA 09/24/2022   Right ear pain 09/24/2022   RUQ pain 03/27/2022   BPH associated with nocturia 09/24/2021   Arm paresthesia, left 07/10/2021   Aortic atherosclerosis (HCC) 09/08/2020   Chronic anticoagulation 12/27/2019   Acute upper GI bleed 09/30/2019   Hiatal hernia    Gastritis and gastroduodenitis    Duodenal nodule    Coagulopathy (HCC) 09/28/2019   Hypokalemia 09/06/2019   Dysphagia 09/05/2019   Multiple contusions 12/11/2018   Vitamin D deficiency 12/11/2018   History of DVT (deep vein thrombosis) 11/25/2018   Primary osteoarthritis of left knee 02/01/2018   Insomnia 07/13/2017   ATN (acute tubular necrosis) (HCC)    Melena    Balance problems 06/13/2017   Acute hearing loss, right 06/13/2017   Dehydration 06/13/2017   Symptomatic anemia 06/13/2017   Hx of pulmonary embolus 06/13/2017   Thrombocytopenia (HCC) 06/13/2017   Alcohol abuse 06/13/2017   AKI (acute kidney injury) (HCC) 06/13/2017   Lung blebs (HCC) 07/31/2016   Acute DVT (deep venous thrombosis) (HCC) 07/25/2016   Pneumothorax on right 07/23/2016   CKD (chronic kidney disease), stage III (HCC) 07/23/2016   Gross hematuria 04/25/2016   Dysfunction of both eustachian tubes 10/06/2015   UTI (urinary tract infection) 08/26/2015   Bilateral hearing loss 08/26/2015   Coronary artery calcification seen on CT scan 06/01/2015   Low back pain 06/01/2015   Hyperglycemia 12/18/2014   Anemia 05/26/2014   Cerebellar stroke (HCC) 05/26/2014   Brain contusion (HCC) 04/23/2014   Lung nodule 03/09/2014   Acute pulmonary embolism (HCC) 02/18/2014   Multiple nodules of lung 02/18/2014   Cough 12/24/2013   Allergic rhinitis 12/24/2013   Other chest pain 12/24/2013   Increased prostate specific antigen (PSA) velocity  11/20/2013   Eustachian tube dysfunction 11/20/2013   Hearing loss on right 11/20/2013   Serous otitis media 11/15/2013   Muscle tightness 10/02/2013   Rotator cuff injury 09/17/2013   Skin lesion 07/25/2013   Anal pain 12/18/2012   Habitual alcohol use 11/17/2012   Cellulitis and abscess of buttock 11/15/2012   Cluster headaches 06/07/2012   BPH (benign prostatic hyperplasia) 11/22/2010   Abnormal LFTs 11/14/2010   Right inguinal hernia 09/16/2010   Nocturia 08/29/2010   Preventative health care 08/28/2010   FREQUENCY, URINARY 12/24/2009   COPD (chronic obstructive pulmonary disease) (HCC) 05/12/2009   ABNORMAL ELECTROCARDIOGRAM 11/02/2008   Solitary pulmonary nodule 05/14/2008   Cramp of limb 07/19/2007   Diverticulosis of colon 03/07/2007   Hyperlipidemia 10/23/2006   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/23/2006   Essential hypertension 10/23/2006   Unspecified Peripheral Vascular Disease 10/23/2006   Hemorrhoids 10/23/2006   Abscess of lung (HCC) 10/23/2006   GERD 10/23/2006    ONSET DATE: 11/03/2022  REFERRING DIAG: E55.9 (ICD-10-CM) - Vitamin D deficiency R26.89 (ICD-10-CM) - Imbalance F10.10 (ICD-10-CM) - Alcohol abuse I63.9 (ICD-10-CM) - Cerebellar stroke (HCC) G62.9 (ICD-10-CM) - Polyneuropathy  THERAPY DIAG:  Unsteadiness on  feet  Other abnormalities of gait and mobility  Rationale for Evaluation and Treatment: Rehabilitation  SUBJECTIVE:                                                                                                                                                                                             SUBJECTIVE STATEMENT: No changes, no falls. Exercises are going pretty good at home.   Pt accompanied by: self  PERTINENT HISTORY: PMH: cerebellar stroke, HTN, HLD, CAD, PVD, left plantar fasciitis, hearing loss, former smoker, neuropathy who presents to neurology clinic with worsening ambulation, hx of right cerebellar stroke.    Per Dr. Loleta Chance:  Patient noticed balance problems about a month ago. When he gets up he feels off balance for a few seconds. When he walks, he feels his stance is wider and that he may fall to the left while walking. He has not fallen. He denies any pain. He will sit down for a few minutes and may have some improve. He resets himself. He does not have clear spinning or dizziness. He endorses cramps in his calves, but denies numbness and tingling.   Patient's symptoms of imbalance and impaired ambulation is likely multifactorial with contributions from prior right cerebellar stroke, right inner ear dysfunction, and likely peripheral neuropathy.   PAIN:  Are you having pain? No  PRECAUTIONS: Fall  WEIGHT BEARING RESTRICTIONS: No  FALLS: Has patient fallen in last 6 months? No  LIVING ENVIRONMENT: Lives with: lives with their spouse Lives in: House/apartment Stairs: Yes: Internal: 13 steps; on right going up and External: 2 steps; none Has following equipment at home: Single point cane, Walker - 4 wheeled, and Grab bars  PLOF: Independent  PATIENT GOALS: Improve the balance   OBJECTIVE:   DIAGNOSTIC FINDINGS: Right cerebellar infarct was noted on MRI brain from 04/20/2014 as remote. MRI brain on 09/23/22 showed no new infarct. Bilateral mastoid effusions were noted.   COGNITION: Overall cognitive status: Within functional limits for tasks assessed   SENSATION: Light touch: WFL Pt able to detect light touch to ankles, but impaired sensation in toes due to neuropathy   COORDINATION: Heel to shin: WNL bilat    POSTURE: No Significant postural limitations  LOWER EXTREMITY MMT:    MMT Right Eval Left Eval  Hip flexion 5 5  Hip extension    Hip abduction 4+ 4+  Hip adduction 4+ 4+  Hip internal rotation    Hip external rotation    Knee flexion 4+ 5  Knee extension 5 5  Ankle dorsiflexion 5 5  Ankle plantarflexion    Ankle inversion  Ankle eversion    (Blank rows = not tested)  All  tested in sitting    TRANSFERS: Assistive device utilized: None  Sit to stand: CGA/SBA Stand to sit: SBA SBA when performing with BUE support  CGA with no UE support to stand, performs with wide BOS  GAIT: Gait pattern: step through pattern, decreased trunk rotation, wide BOS, and poor foot clearance- Right Distance walked: Clinic distances  Assistive device utilized: None Level of assistance: SBA Comments: Pt with one instance of getting R toe caught, but able to keep his balance. Pt reports that this is what happens to him at home.   FUNCTIONAL TESTS:  5 times sit to stand: 16.7 seconds with BUE support  10 meter walk test: 10.75 seconds = 3.05 ft/sec    M-CTSIB  Condition 1: Firm Surface, EO 30 Sec, Normal Sway  Condition 2: Firm Surface, EC 30 Sec, Mild Sway  Condition 3: Foam Surface, EO 30 Sec, Mild Sway  Condition 4: Foam Surface, EC 9.5 Sec        TODAY'S TREATMENT:                                                                                                                               Therapeutic Exercise: SciFit with BUE/BLE at Gear 5.0 for 8 minutes for strengthening, activity tolerance, aerobic warmup  10 reps sit <> stands from elevated mat table with no UE support   NMR: On air ex: EO feet together: 2 x 10 reps head turns, 2 x 10 reps head nods  EC feet apart: 10 reps head turns, 10 reps head nods  Pt more unsteady with head nods Alternating SLS taps to 2 cones, x10 reps each leg, then progressing to forward and cross body taps x10 reps each side, pt more challenged with SLS on LLE   With 6 blaze pods on first and 2nd steps on staircase on random hit setting for SLS, weight shifting, visual scanning, performing without UE support, CGA for balance   Performed 3 bouts of 1 minute each: 30 hits, 26 hits, 30 hits  Pt reporting RPE as 8/10 Standing rest break in between each rep    GAIT: Gait pattern: step through pattern, decreased trunk rotation,  wide BOS, and poor foot clearance- Right and left Distance walked: Clinic distances Assistive device utilized: None Level of assistance: SBA Comments: Pt with one episode of toe catching after balance tasks, but pt able to maintain balance   PATIENT EDUCATION: Education details: Continue HEP, added eyes closed with head motions  Person educated: Patient Education method: Explanation, Demonstration, Tactile cues, Verbal cues, and Handouts Education comprehension: verbalized understanding, returned demonstration, verbal cues required, tactile cues required, and needs further education   HOME EXERCISE PROGRAM: Access Code: [email or web address] URL: [email or web address] Date: 11/27/2022 Prepared by: Sherlie Ban  Exercises - Gastroc Stretch on Wall  - 2 x daily - 7 x weekly - 1 sets - 3  reps - 30-60 second  hold - Soleus Stretch on Wall  - 2 x daily - 7 x weekly - 1 sets - 3 reps - 30-60 second hold - Clamshell  - 1 x daily - 4 x weekly - 3 sets - 10 reps - Half Tandem Stance Balance with Eyes Closed  - 1 x daily - 7 x weekly - 1 sets - 3 reps - 30 second hold - Wide Stance with Eyes Closed on Foam Pad  - 1-2 x daily - 5 x weekly - 2 sets - 5 reps  GOALS: Goals reviewed with patient? Yes  SHORT TERM GOALS: ALL STGS = LTGS  LONG TERM GOALS: Target date: 12/12/2022  Pt will be independent with final HEP in order to build upon functional gains made in therapy. Baseline:  Goal status: INITIAL  2.  Pt will improve FGA to at least a 26/30 in order to demo decr fall risk.  Baseline: 23/30 Goal status: INITIAL  3.  Pt will improve 5x sit<>stand to less than or equal to 14.5 sec to demonstrate improved functional strength and transfer efficiency.  Baseline: 16.7 seconds with BUE support Goal status: INITIAL  4.  Pt will improve condition 4 of mCTSIB to at least 20 seconds to demo improved vestibular input for balance.  Baseline: 9.5 seconds  Goal status:  INITIAL    ASSESSMENT:  CLINICAL IMPRESSION:  Today's skilled session focused on BLE strengthening, balance on compliant surfaces and SLS tasks. Pt most challenged by balance with head nods and SLS tasks on LLE. Pt reporting RPE as 8/10 after blaze pod SLS activities. CGA as needed for balance. Will continue per POC.    OBJECTIVE IMPAIRMENTS: Abnormal gait, decreased activity tolerance, decreased balance, decreased mobility, decreased strength, dizziness, and impaired sensation.   ACTIVITY LIMITATIONS: carrying, bending, stairs, transfers, and locomotion level  PARTICIPATION LIMITATIONS: community activity  PERSONAL FACTORS: Age, Past/current experiences, Time since onset of injury/illness/exacerbation, and 3+ comorbidities: cerebellar stroke, HTN, HLD, CAD, PVD, left plantar fasciitis, hearing loss, former smoker, neuropathy   are also affecting patient's functional outcome.   REHAB POTENTIAL: Good  CLINICAL DECISION MAKING: Evolving/moderate complexity  EVALUATION COMPLEXITY: Moderate  PLAN:  PT FREQUENCY: 2x/week  PT DURATION: 8 weeks - due to delay in scheduling, just predict 4 weeks   PLANNED INTERVENTIONS: Therapeutic exercises, Therapeutic activity, Neuromuscular re-education, Balance training, Gait training, Patient/Family education, Self Care, Joint mobilization, Stair training, Vestibular training, DME instructions, and Re-evaluation  PLAN FOR NEXT SESSION: Add to HEP as needed or balance with head motions, narrow BOS, unlevel surfaces, and EC. Work on stepping reactions with dynamic episodes of stability, resisted gait with perturbations  Drake Leach, PT, DPT 11/27/2022, 10:30 AM

## 2022-11-30 ENCOUNTER — Ambulatory Visit: Payer: Medicare Other | Admitting: Physical Therapy

## 2022-11-30 ENCOUNTER — Encounter: Payer: Self-pay | Admitting: Physical Therapy

## 2022-11-30 DIAGNOSIS — R2689 Other abnormalities of gait and mobility: Secondary | ICD-10-CM

## 2022-11-30 DIAGNOSIS — R2681 Unsteadiness on feet: Secondary | ICD-10-CM

## 2022-11-30 NOTE — Therapy (Signed)
OUTPATIENT PHYSICAL THERAPY NEURO TREATMENT   Patient Name: Charles [name] MRN: [id number] DOB:1950/12/12, 72 y.o., male Today's Date: 11/30/2022   PCP: Corwin Levins, MD   REFERRING PROVIDER:  Antony Madura, MD  11/30/22 10:14 AM   END OF SESSION:  PT End of Session - 11/30/22 0944     Visit Number 5    Number of Visits 9    Date for PT Re-Evaluation 01/13/23    Authorization Type Medicare    PT Start Time [phone]   pt late to session   PT Stop Time 1014    PT Time Calculation (min) 31 min    Equipment Utilized During Treatment Gait belt    Activity Tolerance Patient tolerated treatment well    Behavior During Therapy WFL for tasks assessed/performed             Past Medical History:  Diagnosis Date   ABNORMAL ELECTROCARDIOGRAM 11/02/2008   ABSCESS, LUNG 10/23/2006   Anemia 05/26/2014   BACTERIAL PNEUMONIA 12/28/2009   BPH (benign prostatic hyperplasia) 11/22/2010   Cerebellar stroke (HCC) 05/26/2014   CHEST PAIN 12/24/2009   Coronary artery calcification seen on CT scan 06/01/2015   Cramp of limb 07/19/2007   DIVERTICULOSIS, COLON 03/07/2007   DVT (deep venous thrombosis) (HCC) 01/2014   LLE   Emphysema of lung (HCC)    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/23/2006   FLANK PAIN, LEFT 02/25/2010   FREQUENCY, URINARY 12/24/2009   HEMORRHOIDS 10/23/2006   HYPERLIPIDEMIA 10/23/2006   HYPERTENSION 10/23/2006   PE (pulmonary embolism) 02/2014   PLANTAR FASCIITIS, LEFT 11/02/2008   Pneumonia 12/2013 X 2   PSA, INCREASED 11/20/2007   PULMONARY NODULE 05/14/2008   Unspecified Peripheral Vascular Disease 10/23/2006   Past Surgical History:  Procedure Laterality Date   BIOPSY  09/29/2019   Procedure: BIOPSY;  Surgeon: Beverley Fiedler, MD;  Location: [facility];  Service: Endoscopy;;   BIOPSY  12/25/2019   Procedure: BIOPSY;  Surgeon: Rachael Fee, MD;  Location: [facility];  Service: Endoscopy;;   COLONOSCOPY     ESOPHAGOGASTRODUODENOSCOPY (EGD) WITH PROPOFOL N/A 06/15/2017   Procedure:  ESOPHAGOGASTRODUODENOSCOPY (EGD) WITH PROPOFOL;  Surgeon: Beverley Fiedler, MD;  Location: [facility];  Service: Gastroenterology;  Laterality: N/A;   ESOPHAGOGASTRODUODENOSCOPY (EGD) WITH PROPOFOL N/A 09/29/2019   Procedure: ESOPHAGOGASTRODUODENOSCOPY (EGD) WITH PROPOFOL;  Surgeon: Beverley Fiedler, MD;  Location: [facility];  Service: Endoscopy;  Laterality: N/A;   ESOPHAGOGASTRODUODENOSCOPY (EGD) WITH PROPOFOL N/A 12/25/2019   Procedure: ESOPHAGOGASTRODUODENOSCOPY (EGD) WITH PROPOFOL;  Surgeon: Rachael Fee, MD;  Location: [facility];  Service: Endoscopy;  Laterality: N/A;   EUS N/A 12/25/2019   Procedure: UPPER ENDOSCOPIC ULTRASOUND (EUS) RADIAL;  Surgeon: Rachael Fee, MD;  Location: [facility];  Service: Endoscopy;  Laterality: N/A;   HEMORRHOID SURGERY  ?1990's   INGUINAL HERNIA REPAIR Bilateral ?2000's   IVC FILTER INSERTION N/A 10/03/2016   Procedure: IVC Filter Retrieveal;  Surgeon: Nada Libman, MD;  Location: [facility];  Service: Cardiovascular;  Laterality: N/A;   MYRINGOTOMY WITH TUBE PLACEMENT Right 06/26/2018   SHOULDER ARTHROSCOPY W/ ROTATOR CUFF REPAIR Right 11/2013   STAPLING OF BLEBS Right 07/31/2016   Procedure: BLEBECTOMY;  Surgeon: Loreli Slot, MD;  Location: [facility];  Service: Thoracic;  Laterality: Right;   VENA CAVA FILTER PLACEMENT Right 08/04/2016   Procedure: INSERTION VENA-CAVA FILTER;  Surgeon: Loreli Slot, MD;  Location: [facility] OR;  Service: Thoracic;  Laterality: Right;   VIDEO ASSISTED THORACOSCOPY  Right 07/31/2016   Procedure: VIDEO ASSISTED THORACOSCOPY;  Surgeon: Loreli Slot, MD;  Location: [facility] OR;  Service: Thoracic;  Laterality: Right;   VIDEO ASSISTED THORACOSCOPY Right 08/04/2016   Procedure: REDO VIDEO ASSISTED THORACOSCOPY- EVACUATION OF HEMOTHORAX;  Surgeon: Loreli Slot, MD;  Location: [facility] OR;  Service: Thoracic;  Laterality: Right;   VIDEO BRONCHOSCOPY WITH ENDOBRONCHIAL NAVIGATION N/A 03/11/2014    Procedure: VIDEO BRONCHOSCOPY WITH ENDOBRONCHIAL NAVIGATION;  Surgeon: Leslye Peer, MD;  Location: [facility] OR;  Service: Thoracic;  Laterality: N/A;   Patient Active Problem List   Diagnosis Date Noted   Elevated PSA 09/24/2022   Right ear pain 09/24/2022   RUQ pain 03/27/2022   BPH associated with nocturia 09/24/2021   Arm paresthesia, left 07/10/2021   Aortic atherosclerosis (HCC) 09/08/2020   Chronic anticoagulation 12/27/2019   Acute upper GI bleed 09/30/2019   Hiatal hernia    Gastritis and gastroduodenitis    Duodenal nodule    Coagulopathy (HCC) 09/28/2019   Hypokalemia 09/06/2019   Dysphagia 09/05/2019   Multiple contusions 12/11/2018   Vitamin D deficiency 12/11/2018   History of DVT (deep vein thrombosis) 11/25/2018   Primary osteoarthritis of left knee 02/01/2018   Insomnia 07/13/2017   ATN (acute tubular necrosis) (HCC)    Melena    Balance problems 06/13/2017   Acute hearing loss, right 06/13/2017   Dehydration 06/13/2017   Symptomatic anemia 06/13/2017   Hx of pulmonary embolus 06/13/2017   Thrombocytopenia (HCC) 06/13/2017   Alcohol abuse 06/13/2017   AKI (acute kidney injury) (HCC) 06/13/2017   Lung blebs (HCC) 07/31/2016   Acute DVT (deep venous thrombosis) (HCC) 07/25/2016   Pneumothorax on right 07/23/2016   CKD (chronic kidney disease), stage III (HCC) 07/23/2016   Gross hematuria 04/25/2016   Dysfunction of both eustachian tubes 10/06/2015   UTI (urinary tract infection) 08/26/2015   Bilateral hearing loss 08/26/2015   Coronary artery calcification seen on CT scan 06/01/2015   Low back pain 06/01/2015   Hyperglycemia 12/18/2014   Anemia 05/26/2014   Cerebellar stroke (HCC) 05/26/2014   Brain contusion (HCC) 04/23/2014   Lung nodule 03/09/2014   Acute pulmonary embolism (HCC) 02/18/2014   Multiple nodules of lung 02/18/2014   Cough 12/24/2013   Allergic rhinitis 12/24/2013   Other chest pain 12/24/2013   Increased prostate specific antigen  (PSA) velocity 11/20/2013   Eustachian tube dysfunction 11/20/2013   Hearing loss on right 11/20/2013   Serous otitis media 11/15/2013   Muscle tightness 10/02/2013   Rotator cuff injury 09/17/2013   Skin lesion 07/25/2013   Anal pain 12/18/2012   Habitual alcohol use 11/17/2012   Cellulitis and abscess of buttock 11/15/2012   Cluster headaches 06/07/2012   BPH (benign prostatic hyperplasia) 11/22/2010   Abnormal LFTs 11/14/2010   Right inguinal hernia 09/16/2010   Nocturia 08/29/2010   Preventative health care 08/28/2010   FREQUENCY, URINARY 12/24/2009   COPD (chronic obstructive pulmonary disease) (HCC) 05/12/2009   ABNORMAL ELECTROCARDIOGRAM 11/02/2008   Solitary pulmonary nodule 05/14/2008   Cramp of limb 07/19/2007   Diverticulosis of colon 03/07/2007   Hyperlipidemia 10/23/2006   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/23/2006   Essential hypertension 10/23/2006   Unspecified Peripheral Vascular Disease 10/23/2006   Hemorrhoids 10/23/2006   Abscess of lung (HCC) 10/23/2006   GERD 10/23/2006    ONSET DATE: 11/03/2022  REFERRING DIAG: E55.9 (ICD-10-CM) - Vitamin D deficiency R26.89 (ICD-10-CM) - Imbalance F10.10 (ICD-10-CM) - Alcohol abuse I63.9 (ICD-10-CM) - Cerebellar stroke (HCC) G62.9 (ICD-10-CM) - Polyneuropathy  THERAPY DIAG:  Unsteadiness on feet  Other abnormalities of gait and mobility  Rationale for Evaluation and Treatment: Rehabilitation  SUBJECTIVE:                                                                                                                                                                                             SUBJECTIVE STATEMENT: No changes since he was last here. Got his appt times mixed up, thought it was at 9:45 instead of 9:30.   Pt accompanied by: self  PERTINENT HISTORY: PMH: cerebellar stroke, HTN, HLD, CAD, PVD, left plantar fasciitis, hearing loss, former smoker, neuropathy who presents to neurology clinic with worsening ambulation,  hx of right cerebellar stroke.    Per Dr. Loleta Chance: Patient noticed balance problems about a month ago. When he gets up he feels off balance for a few seconds. When he walks, he feels his stance is wider and that he may fall to the left while walking. He has not fallen. He denies any pain. He will sit down for a few minutes and may have some improve. He resets himself. He does not have clear spinning or dizziness. He endorses cramps in his calves, but denies numbness and tingling.   Patient's symptoms of imbalance and impaired ambulation is likely multifactorial with contributions from prior right cerebellar stroke, right inner ear dysfunction, and likely peripheral neuropathy.   PAIN:  Are you having pain? No  PRECAUTIONS: Fall  WEIGHT BEARING RESTRICTIONS: No  FALLS: Has patient fallen in last 6 months? No  LIVING ENVIRONMENT: Lives with: lives with their spouse Lives in: House/apartment Stairs: Yes: Internal: 13 steps; on right going up and External: 2 steps; none Has following equipment at home: Single point cane, Walker - 4 wheeled, and Grab bars  PLOF: Independent  PATIENT GOALS: Improve the balance   OBJECTIVE:   DIAGNOSTIC FINDINGS: Right cerebellar infarct was noted on MRI brain from 04/20/2014 as remote. MRI brain on 09/23/22 showed no new infarct. Bilateral mastoid effusions were noted.   COGNITION: Overall cognitive status: Within functional limits for tasks assessed   SENSATION: Light touch: WFL Pt able to detect light touch to ankles, but impaired sensation in toes due to neuropathy   COORDINATION: Heel to shin: WNL bilat    POSTURE: No Significant postural limitations  LOWER EXTREMITY MMT:    MMT Right Eval Left Eval  Hip flexion 5 5  Hip extension    Hip abduction 4+ 4+  Hip adduction 4+ 4+  Hip internal rotation    Hip external rotation    Knee flexion 4+ 5  Knee extension 5 5  Ankle dorsiflexion 5 5  Ankle plantarflexion    Ankle inversion    Ankle  eversion    (Blank rows = not tested)  All tested in sitting    TRANSFERS: Assistive device utilized: None  Sit to stand: CGA/SBA Stand to sit: SBA SBA when performing with BUE support  CGA with no UE support to stand, performs with wide BOS  GAIT: Gait pattern: step through pattern, decreased trunk rotation, wide BOS, and poor foot clearance- Right Distance walked: Clinic distances  Assistive device utilized: None Level of assistance: SBA Comments: Pt with one instance of getting R toe caught, but able to keep his balance. Pt reports that this is what happens to him at home.   FUNCTIONAL TESTS:  5 times sit to stand: 16.7 seconds with BUE support  10 meter walk test: 10.75 seconds = 3.05 ft/sec    M-CTSIB  Condition 1: Firm Surface, EO 30 Sec, Normal Sway  Condition 2: Firm Surface, EC 30 Sec, Mild Sway  Condition 3: Foam Surface, EO 30 Sec, Mild Sway  Condition 4: Foam Surface, EC 9.5 Sec        TODAY'S TREATMENT:                                                                                                                                NMR: On air ex: 2 x 10 reps sit <> stands with no UE support from elevated mat table, cues for incr forward lean to decr BLE bracing against mat  3/4 tandem with ball toss with 2nd PT, 2 x 10 reps bilat, performed in corner with intermittent taps to wall for balance   On blue foam balance beam: Alternating forward stepping for reactive stepping x6-8 reps each side, progressing to adding head turn to R/L when stepping an additional 10 reps, pt needing intermittent UE support for balance  Alternating lateral stepping over smaller orange obstacle, x6-8 reps each leg, progressing to adding a reach when stepping, an additional 10 reps, pt more challenged by balance when reaching   On rockerboard in A/P direction: 15 reps weight shifting, using hip/ankle strategy for balance    GAIT: Gait pattern: step through pattern, decreased  trunk rotation, wide BOS, and poor foot clearance- Right and left Distance walked: Clinic distances Assistive device utilized: None Level of assistance: SBA Comments: No episodes of toe catching during session   PATIENT EDUCATION: Education details: Continue HEP, Verbally added sit <> stands from a slightly higher surface Person educated: Patient Education method: Explanation, Demonstration, and Verbal cues Education comprehension: verbalized understanding, returned demonstration, and verbal cues required   HOME EXERCISE PROGRAM: Verbally added sit <> stands from a slightly higher surface Access Code: [email or web address] URL: [email or web address] Date: 11/27/2022 Prepared by: Sherlie Ban  Exercises - Gastroc Stretch on Wall  - 2 x daily - 7 x weekly - 1 sets - 3 reps - 30-60 second  hold - Soleus Stretch on  Wall  - 2 x daily - 7 x weekly - 1 sets - 3 reps - 30-60 second hold - Clamshell  - 1 x daily - 4 x weekly - 3 sets - 10 reps - Half Tandem Stance Balance with Eyes Closed  - 1 x daily - 7 x weekly - 1 sets - 3 reps - 30 second hold - Wide Stance with Eyes Closed on Foam Pad  - 1-2 x daily - 5 x weekly - 2 sets - 5 reps - 5 reps head turns and 5 reps nods   GOALS: Goals reviewed with patient? Yes  SHORT TERM GOALS: ALL STGS = LTGS  LONG TERM GOALS: Target date: 12/12/2022  Pt will be independent with final HEP in order to build upon functional gains made in therapy. Baseline:  Goal status: INITIAL  2.  Pt will improve FGA to at least a 26/30 in order to demo decr fall risk.  Baseline: 23/30 Goal status: INITIAL  3.  Pt will improve 5x sit<>stand to less than or equal to 14.5 sec to demonstrate improved functional strength and transfer efficiency.  Baseline: 16.7 seconds with BUE support Goal status: INITIAL  4.  Pt will improve condition 4 of mCTSIB to at least 20 seconds to demo improved vestibular input for balance.  Baseline: 9.5 seconds  Goal status:  INITIAL    ASSESSMENT:  CLINICAL IMPRESSION:  Session limited today due to pt arriving late, pt got his appt times mixed up. Pt reports sit <> stands are getting easier at home. Verbally added, trying to work on them from a higher surface and trying to not use UE support as needed. Today's skilled session continued to focus on balance tasks on compliant surfaces with stepping strategies and narrow BOS. Pt most challenged by stepping tasks on foam. Intermittent seated/standing rest breaks taken during session. Will continue per POC.    OBJECTIVE IMPAIRMENTS: Abnormal gait, decreased activity tolerance, decreased balance, decreased mobility, decreased strength, dizziness, and impaired sensation.   ACTIVITY LIMITATIONS: carrying, bending, stairs, transfers, and locomotion level  PARTICIPATION LIMITATIONS: community activity  PERSONAL FACTORS: Age, Past/current experiences, Time since onset of injury/illness/exacerbation, and 3+ comorbidities: cerebellar stroke, HTN, HLD, CAD, PVD, left plantar fasciitis, hearing loss, former smoker, neuropathy   are also affecting patient's functional outcome.   REHAB POTENTIAL: Good  CLINICAL DECISION MAKING: Evolving/moderate complexity  EVALUATION COMPLEXITY: Moderate  PLAN:  PT FREQUENCY: 2x/week  PT DURATION: 8 weeks - due to delay in scheduling, just predict 4 weeks   PLANNED INTERVENTIONS: Therapeutic exercises, Therapeutic activity, Neuromuscular re-education, Balance training, Gait training, Patient/Family education, Self Care, Joint mobilization, Stair training, Vestibular training, DME instructions, and Re-evaluation  PLAN FOR NEXT SESSION: Add to HEP as needed. Work on balance with head motions, narrow BOS, unlevel surfaces, and EC. Work on stepping reactions with dynamic episodes of stability, resisted gait with perturbations  Drake Leach, PT, DPT 11/30/2022, 10:14 AM

## 2022-12-04 ENCOUNTER — Encounter: Payer: Self-pay | Admitting: Physical Therapy

## 2022-12-04 ENCOUNTER — Ambulatory Visit: Payer: Medicare Other | Admitting: Physical Therapy

## 2022-12-04 VITALS — BP 131/81 | HR 79

## 2022-12-04 DIAGNOSIS — R2681 Unsteadiness on feet: Secondary | ICD-10-CM | POA: Diagnosis not present

## 2022-12-04 DIAGNOSIS — R2689 Other abnormalities of gait and mobility: Secondary | ICD-10-CM

## 2022-12-04 NOTE — Therapy (Signed)
OUTPATIENT PHYSICAL THERAPY NEURO TREATMENT   Patient Name: Charles [name] MRN: [id number] DOB:07-14-1950, 72 y.o., male Today's Date: 12/04/2022   PCP: Corwin Levins, MD   REFERRING PROVIDER:  Antony Madura, MD  12/04/22 8:49 AM   END OF SESSION:  PT End of Session - 12/04/22 0805     Visit Number 6    Number of Visits 9    Date for PT Re-Evaluation 01/13/23    Authorization Type Medicare    PT Start Time 0805    PT Stop Time 0845    PT Time Calculation (min) 40 min    Equipment Utilized During Treatment Gait belt    Activity Tolerance Patient tolerated treatment well    Behavior During Therapy WFL for tasks assessed/performed             Past Medical History:  Diagnosis Date   ABNORMAL ELECTROCARDIOGRAM 11/02/2008   ABSCESS, LUNG 10/23/2006   Anemia 05/26/2014   BACTERIAL PNEUMONIA 12/28/2009   BPH (benign prostatic hyperplasia) 11/22/2010   Cerebellar stroke (HCC) 05/26/2014   CHEST PAIN 12/24/2009   Coronary artery calcification seen on CT scan 06/01/2015   Cramp of limb 07/19/2007   DIVERTICULOSIS, COLON 03/07/2007   DVT (deep venous thrombosis) (HCC) 01/2014   LLE   Emphysema of lung (HCC)    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/23/2006   FLANK PAIN, LEFT 02/25/2010   FREQUENCY, URINARY 12/24/2009   HEMORRHOIDS 10/23/2006   HYPERLIPIDEMIA 10/23/2006   HYPERTENSION 10/23/2006   PE (pulmonary embolism) 02/2014   PLANTAR FASCIITIS, LEFT 11/02/2008   Pneumonia 12/2013 X 2   PSA, INCREASED 11/20/2007   PULMONARY NODULE 05/14/2008   Unspecified Peripheral Vascular Disease 10/23/2006   Past Surgical History:  Procedure Laterality Date   BIOPSY  09/29/2019   Procedure: BIOPSY;  Surgeon: Beverley Fiedler, MD;  Location: [facility];  Service: Endoscopy;;   BIOPSY  12/25/2019   Procedure: BIOPSY;  Surgeon: Rachael Fee, MD;  Location: [facility];  Service: Endoscopy;;   COLONOSCOPY     ESOPHAGOGASTRODUODENOSCOPY (EGD) WITH PROPOFOL N/A 06/15/2017   Procedure: ESOPHAGOGASTRODUODENOSCOPY  (EGD) WITH PROPOFOL;  Surgeon: Beverley Fiedler, MD;  Location: [facility];  Service: Gastroenterology;  Laterality: N/A;   ESOPHAGOGASTRODUODENOSCOPY (EGD) WITH PROPOFOL N/A 09/29/2019   Procedure: ESOPHAGOGASTRODUODENOSCOPY (EGD) WITH PROPOFOL;  Surgeon: Beverley Fiedler, MD;  Location: [facility];  Service: Endoscopy;  Laterality: N/A;   ESOPHAGOGASTRODUODENOSCOPY (EGD) WITH PROPOFOL N/A 12/25/2019   Procedure: ESOPHAGOGASTRODUODENOSCOPY (EGD) WITH PROPOFOL;  Surgeon: Rachael Fee, MD;  Location: [facility];  Service: Endoscopy;  Laterality: N/A;   EUS N/A 12/25/2019   Procedure: UPPER ENDOSCOPIC ULTRASOUND (EUS) RADIAL;  Surgeon: Rachael Fee, MD;  Location: [facility];  Service: Endoscopy;  Laterality: N/A;   HEMORRHOID SURGERY  ?1990's   INGUINAL HERNIA REPAIR Bilateral ?2000's   IVC FILTER INSERTION N/A 10/03/2016   Procedure: IVC Filter Retrieveal;  Surgeon: Nada Libman, MD;  Location: [facility];  Service: Cardiovascular;  Laterality: N/A;   MYRINGOTOMY WITH TUBE PLACEMENT Right 06/26/2018   SHOULDER ARTHROSCOPY W/ ROTATOR CUFF REPAIR Right 11/2013   STAPLING OF BLEBS Right 07/31/2016   Procedure: BLEBECTOMY;  Surgeon: Loreli Slot, MD;  Location: [facility] OR;  Service: Thoracic;  Laterality: Right;   VENA CAVA FILTER PLACEMENT Right 08/04/2016   Procedure: INSERTION VENA-CAVA FILTER;  Surgeon: Loreli Slot, MD;  Location: [facility] OR;  Service: Thoracic;  Laterality: Right;   VIDEO ASSISTED THORACOSCOPY Right 07/31/2016   Procedure:  VIDEO ASSISTED THORACOSCOPY;  Surgeon: Loreli Slot, MD;  [facility];  Service: Thoracic;  Laterality: Right;   VIDEO ASSISTED THORACOSCOPY Right 08/04/2016   Procedure: REDO VIDEO ASSISTED THORACOSCOPY- EVACUATION OF HEMOTHORAX;  Surgeon: Loreli Slot, MD;  Location: [facility] OR;  Service: Thoracic;  Laterality: Right;   VIDEO BRONCHOSCOPY WITH ENDOBRONCHIAL NAVIGATION N/A 03/11/2014   Procedure: VIDEO  BRONCHOSCOPY WITH ENDOBRONCHIAL NAVIGATION;  Surgeon: Leslye Peer, MD;  Location: [facility] OR;  Service: Thoracic;  Laterality: N/A;   Patient Active Problem List   Diagnosis Date Noted   Elevated PSA 09/24/2022   Right ear pain 09/24/2022   RUQ pain 03/27/2022   BPH associated with nocturia 09/24/2021   Arm paresthesia, left 07/10/2021   Aortic atherosclerosis (HCC) 09/08/2020   Chronic anticoagulation 12/27/2019   Acute upper GI bleed 09/30/2019   Hiatal hernia    Gastritis and gastroduodenitis    Duodenal nodule    Coagulopathy (HCC) 09/28/2019   Hypokalemia 09/06/2019   Dysphagia 09/05/2019   Multiple contusions 12/11/2018   Vitamin D deficiency 12/11/2018   History of DVT (deep vein thrombosis) 11/25/2018   Primary osteoarthritis of left knee 02/01/2018   Insomnia 07/13/2017   ATN (acute tubular necrosis) (HCC)    Melena    Balance problems 06/13/2017   Acute hearing loss, right 06/13/2017   Dehydration 06/13/2017   Symptomatic anemia 06/13/2017   Hx of pulmonary embolus 06/13/2017   Thrombocytopenia (HCC) 06/13/2017   Alcohol abuse 06/13/2017   AKI (acute kidney injury) (HCC) 06/13/2017   Lung blebs (HCC) 07/31/2016   Acute DVT (deep venous thrombosis) (HCC) 07/25/2016   Pneumothorax on right 07/23/2016   CKD (chronic kidney disease), stage III (HCC) 07/23/2016   Gross hematuria 04/25/2016   Dysfunction of both eustachian tubes 10/06/2015   UTI (urinary tract infection) 08/26/2015   Bilateral hearing loss 08/26/2015   Coronary artery calcification seen on CT scan 06/01/2015   Low back pain 06/01/2015   Hyperglycemia 12/18/2014   Anemia 05/26/2014   Cerebellar stroke (HCC) 05/26/2014   Brain contusion (HCC) 04/23/2014   Lung nodule 03/09/2014   Acute pulmonary embolism (HCC) 02/18/2014   Multiple nodules of lung 02/18/2014   Cough 12/24/2013   Allergic rhinitis 12/24/2013   Other chest pain 12/24/2013   Increased prostate specific antigen (PSA) velocity  11/20/2013   Eustachian tube dysfunction 11/20/2013   Hearing loss on right 11/20/2013   Serous otitis media 11/15/2013   Muscle tightness 10/02/2013   Rotator cuff injury 09/17/2013   Skin lesion 07/25/2013   Anal pain 12/18/2012   Habitual alcohol use 11/17/2012   Cellulitis and abscess of buttock 11/15/2012   Cluster headaches 06/07/2012   BPH (benign prostatic hyperplasia) 11/22/2010   Abnormal LFTs 11/14/2010   Right inguinal hernia 09/16/2010   Nocturia 08/29/2010   Preventative health care 08/28/2010   FREQUENCY, URINARY 12/24/2009   COPD (chronic obstructive pulmonary disease) (HCC) 05/12/2009   ABNORMAL ELECTROCARDIOGRAM 11/02/2008   Solitary pulmonary nodule 05/14/2008   Cramp of limb 07/19/2007   Diverticulosis of colon 03/07/2007   Hyperlipidemia 10/23/2006   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/23/2006   Essential hypertension 10/23/2006   Unspecified Peripheral Vascular Disease 10/23/2006   Hemorrhoids 10/23/2006   Abscess of lung (HCC) 10/23/2006   GERD 10/23/2006    ONSET DATE: 11/03/2022  REFERRING DIAG: E55.9 (ICD-10-CM) - Vitamin D deficiency R26.89 (ICD-10-CM) - Imbalance F10.10 (ICD-10-CM) - Alcohol abuse I63.9 (ICD-10-CM) - Cerebellar stroke (HCC) G62.9 (ICD-10-CM) - Polyneuropathy  THERAPY DIAG:  Unsteadiness on  feet  Other abnormalities of gait and mobility  Rationale for Evaluation and Treatment: Rehabilitation  SUBJECTIVE:                                                                                                                                                                                             SUBJECTIVE STATEMENT: Patient reports that exercises are going well but its helpful to have someone push me. Denies falls/near falls. Patient is open to working on whatever seems most helpful today.   Pt accompanied by: self  PERTINENT HISTORY: PMH: cerebellar stroke, HTN, HLD, CAD, PVD, left plantar fasciitis, hearing loss, former smoker, neuropathy  who presents to neurology clinic with worsening ambulation, hx of right cerebellar stroke.    Per Dr. Loleta Chance: Patient noticed balance problems about a month ago. When he gets up he feels off balance for a few seconds. When he walks, he feels his stance is wider and that he may fall to the left while walking. He has not fallen. He denies any pain. He will sit down for a few minutes and may have some improve. He resets himself. He does not have clear spinning or dizziness. He endorses cramps in his calves, but denies numbness and tingling.   Patient's symptoms of imbalance and impaired ambulation is likely multifactorial with contributions from prior right cerebellar stroke, right inner ear dysfunction, and likely peripheral neuropathy.   PAIN:  Are you having pain? No  PRECAUTIONS: Fall  WEIGHT BEARING RESTRICTIONS: No  FALLS: Has patient fallen in last 6 months? No  LIVING ENVIRONMENT: Lives with: lives with their spouse Lives in: House/apartment Stairs: Yes: Internal: 13 steps; on right going up and External: 2 steps; none Has following equipment at home: Single point cane, Walker - 4 wheeled, and Grab bars  PLOF: Independent  PATIENT GOALS: Improve the balance   OBJECTIVE:   DIAGNOSTIC FINDINGS: Right cerebellar infarct was noted on MRI brain from 04/20/2014 as remote. MRI brain on 09/23/22 showed no new infarct. Bilateral mastoid effusions were noted.   COGNITION: Overall cognitive status: Within functional limits for tasks assessed   SENSATION: Light touch: WFL Pt able to detect light touch to ankles, but impaired sensation in toes due to neuropathy   COORDINATION: Heel to shin: WNL bilat    POSTURE: No Significant postural limitations  LOWER EXTREMITY MMT:    MMT Right Eval Left Eval  Hip flexion 5 5  Hip extension    Hip abduction 4+ 4+  Hip adduction 4+ 4+  Hip internal rotation    Hip external rotation    Knee flexion 4+ 5  Knee extension  5 5  Ankle  dorsiflexion 5 5  Ankle plantarflexion    Ankle inversion    Ankle eversion    (Blank rows = not tested)  All tested in sitting    TRANSFERS: Assistive device utilized: None  Sit to stand: CGA/SBA Stand to sit: SBA SBA when performing with BUE support  CGA with no UE support to stand, performs with wide BOS  GAIT: Gait pattern: step through pattern, decreased trunk rotation, wide BOS, and poor foot clearance- Right Distance walked: Clinic distances  Assistive device utilized: None Level of assistance: SBA Comments: Pt with one instance of getting R toe caught, but able to keep his balance. Pt reports that this is what happens to him at home.   FUNCTIONAL TESTS:  5 times sit to stand: 16.7 seconds with BUE support  10 meter walk test: 10.75 seconds = 3.05 ft/sec    M-CTSIB  Condition 1: Firm Surface, EO 30 Sec, Normal Sway  Condition 2: Firm Surface, EC 30 Sec, Mild Sway  Condition 3: Foam Surface, EO 30 Sec, Mild Sway  Condition 4: Foam Surface, EC 9.5 Sec      TODAY'S TREATMENT:                                                                                                                               Vitals:   12/04/22 0809  BP: 131/81  Pulse: 79   NMR: Dynamic balance course (CGA-minA) Tandem balance beam forward stepping, step onto/off of rocker board, 12" hurdle stepovers x 3, 4 x 12 feet Tandem balance beam lateral stepping, step onto/off of rocker board, 12" hurdle stepovers x 3 with increased vestibular demand with horizontal scanning side to side between each step, 2 x 12 feet Tandem balance beam lateral stepping, step onto/off of rocker board, 12" hurdle stepovers x 3 with increased vestibular demand with vertical scanning side to side between each step, 2 x 12 feet Standing on foam with ladder progression steps to stairs without UE support with starting positioning in stride stance 2 x 10 (CGA) Sit to stand from low mat working on stability and narrowing  base of support 1 x 10, 1 x 5 from chair (SBA - able to perform without UE use)  PATIENT EDUCATION: Education details: Continue HEP, Verbally added sit <> stands from a slightly higher surface Person educated: Patient Education method: Explanation, Demonstration, and Verbal cues Education comprehension: verbalized understanding, returned demonstration, and verbal cues required   HOME EXERCISE PROGRAM: Verbally added sit <> stands from a slightly higher surface Access Code: [email or web address] URL: [email or web address] Date: 11/27/2022 Prepared by: Sherlie Ban  Exercises - Gastroc Stretch on Wall  - 2 x daily - 7 x weekly - 1 sets - 3 reps - 30-60 second  hold - Soleus Stretch on Wall  - 2 x daily - 7 x weekly - 1 sets - 3 reps - 30-60 second hold - Clamshell  - 1 x  daily - 4 x weekly - 3 sets - 10 reps - Half Tandem Stance Balance with Eyes Closed  - 1 x daily - 7 x weekly - 1 sets - 3 reps - 30 second hold - Wide Stance with Eyes Closed on Foam Pad  - 1-2 x daily - 5 x weekly - 2 sets - 5 reps - 5 reps head turns and 5 reps nods   GOALS: Goals reviewed with patient? Yes  SHORT TERM GOALS: ALL STGS = LTGS  LONG TERM GOALS: Target date: 12/12/2022  Pt will be independent with final HEP in order to build upon functional gains made in therapy. Baseline:  Goal status: INITIAL  2.  Pt will improve FGA to at least a 26/30 in order to demo decr fall risk.  Baseline: 23/30 Goal status: INITIAL  3.  Pt will improve 5x sit<>stand to less than or equal to 14.5 sec to demonstrate improved functional strength and transfer efficiency.  Baseline: 16.7 seconds with BUE support Goal status: INITIAL  4.  Pt will improve condition 4 of mCTSIB to at least 20 seconds to demo improved vestibular input for balance.  Baseline: 9.5 seconds  Goal status: INITIAL    ASSESSMENT:  CLINICAL IMPRESSION:  Session emphasized work on balance strategies with emphasis on balance on compliant  surface with increased demand with NBOS, vestibular challenges with head turns, and modified SLS stability. Patient able to perform sit to stand from chair level height without UE support in today's session demonstrating improved LE strength. Will continue per POC.    OBJECTIVE IMPAIRMENTS: Abnormal gait, decreased activity tolerance, decreased balance, decreased mobility, decreased strength, dizziness, and impaired sensation.   ACTIVITY LIMITATIONS: carrying, bending, stairs, transfers, and locomotion level  PARTICIPATION LIMITATIONS: community activity  PERSONAL FACTORS: Age, Past/current experiences, Time since onset of injury/illness/exacerbation, and 3+ comorbidities: cerebellar stroke, HTN, HLD, CAD, PVD, left plantar fasciitis, hearing loss, former smoker, neuropathy   are also affecting patient's functional outcome.   REHAB POTENTIAL: Good  CLINICAL DECISION MAKING: Evolving/moderate complexity  EVALUATION COMPLEXITY: Moderate  PLAN:  PT FREQUENCY: 2x/week  PT DURATION: 8 weeks - due to delay in scheduling, just predict 4 weeks   PLANNED INTERVENTIONS: Therapeutic exercises, Therapeutic activity, Neuromuscular re-education, Balance training, Gait training, Patient/Family education, Self Care, Joint mobilization, Stair training, Vestibular training, DME instructions, and Re-evaluation  PLAN FOR NEXT SESSION: Add to HEP as needed. Work on balance with head motions, narrow BOS, unlevel surfaces, and EC. Work on stepping reactions with dynamic episodes of stability, resisted gait with perturbations could try outdoors on unlevel softer surface for increased challenge  Carmelia Bake, PT, DPT 12/04/2022, 8:49 AM

## 2022-12-06 ENCOUNTER — Encounter: Payer: Self-pay | Admitting: Physical Therapy

## 2022-12-06 ENCOUNTER — Ambulatory Visit: Payer: Medicare Other | Admitting: Physical Therapy

## 2022-12-06 DIAGNOSIS — R2689 Other abnormalities of gait and mobility: Secondary | ICD-10-CM

## 2022-12-06 DIAGNOSIS — R2681 Unsteadiness on feet: Secondary | ICD-10-CM

## 2022-12-06 NOTE — Therapy (Signed)
OUTPATIENT PHYSICAL THERAPY NEURO TREATMENT   Patient Name: Charles [name] MRN: [id number] DOB:1950-02-22, 72 y.o., male Today's Date: 12/06/2022   PCP: Corwin Levins, MD   REFERRING PROVIDER:  Antony Madura, MD  12/06/22 9:30 AM   END OF SESSION:  PT End of Session - 12/06/22 0849     Visit Number 7    Number of Visits 9    Date for PT Re-Evaluation 01/13/23    Authorization Type Medicare    PT Start Time 0848    PT Stop Time 0927    PT Time Calculation (min) 39 min    Equipment Utilized During Treatment Gait belt    Activity Tolerance Patient tolerated treatment well    Behavior During Therapy WFL for tasks assessed/performed             Past Medical History:  Diagnosis Date   ABNORMAL ELECTROCARDIOGRAM 11/02/2008   ABSCESS, LUNG 10/23/2006   Anemia 05/26/2014   BACTERIAL PNEUMONIA 12/28/2009   BPH (benign prostatic hyperplasia) 11/22/2010   Cerebellar stroke (HCC) 05/26/2014   CHEST PAIN 12/24/2009   Coronary artery calcification seen on CT scan 06/01/2015   Cramp of limb 07/19/2007   DIVERTICULOSIS, COLON 03/07/2007   DVT (deep venous thrombosis) (HCC) 01/2014   LLE   Emphysema of lung (HCC)    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/23/2006   FLANK PAIN, LEFT 02/25/2010   FREQUENCY, URINARY 12/24/2009   HEMORRHOIDS 10/23/2006   HYPERLIPIDEMIA 10/23/2006   HYPERTENSION 10/23/2006   PE (pulmonary embolism) 02/2014   PLANTAR FASCIITIS, LEFT 11/02/2008   Pneumonia 12/2013 X 2   PSA, INCREASED 11/20/2007   PULMONARY NODULE 05/14/2008   Unspecified Peripheral Vascular Disease 10/23/2006   Past Surgical History:  Procedure Laterality Date   BIOPSY  09/29/2019   Procedure: BIOPSY;  Surgeon: Beverley Fiedler, MD;  Location: [facility];  Service: Endoscopy;;   BIOPSY  12/25/2019   Procedure: BIOPSY;  Surgeon: Rachael Fee, MD;  Location: [facility];  Service: Endoscopy;;   COLONOSCOPY     ESOPHAGOGASTRODUODENOSCOPY (EGD) WITH PROPOFOL N/A 06/15/2017   Procedure: ESOPHAGOGASTRODUODENOSCOPY  (EGD) WITH PROPOFOL;  Surgeon: Beverley Fiedler, MD;  Location: [facility];  Service: Gastroenterology;  Laterality: N/A;   ESOPHAGOGASTRODUODENOSCOPY (EGD) WITH PROPOFOL N/A 09/29/2019   Procedure: ESOPHAGOGASTRODUODENOSCOPY (EGD) WITH PROPOFOL;  Surgeon: Beverley Fiedler, MD;  Location: [facility];  Service: Endoscopy;  Laterality: N/A;   ESOPHAGOGASTRODUODENOSCOPY (EGD) WITH PROPOFOL N/A 12/25/2019   Procedure: ESOPHAGOGASTRODUODENOSCOPY (EGD) WITH PROPOFOL;  Surgeon: Rachael Fee, MD;  Location: [facility];  Service: Endoscopy;  Laterality: N/A;   EUS N/A 12/25/2019   Procedure: UPPER ENDOSCOPIC ULTRASOUND (EUS) RADIAL;  Surgeon: Rachael Fee, MD;  Location: [facility];  Service: Endoscopy;  Laterality: N/A;   HEMORRHOID SURGERY  ?1990's   INGUINAL HERNIA REPAIR Bilateral ?2000's   IVC FILTER INSERTION N/A 10/03/2016   Procedure: IVC Filter Retrieveal;  Surgeon: Nada Libman, MD;  Location: [facility];  Service: Cardiovascular;  Laterality: N/A;   MYRINGOTOMY WITH TUBE PLACEMENT Right 06/26/2018   SHOULDER ARTHROSCOPY W/ ROTATOR CUFF REPAIR Right 11/2013   STAPLING OF BLEBS Right 07/31/2016   Procedure: BLEBECTOMY;  Surgeon: Loreli Slot, MD;  Location: [facility] OR;  Service: Thoracic;  Laterality: Right;   VENA CAVA FILTER PLACEMENT Right 08/04/2016   Procedure: INSERTION VENA-CAVA FILTER;  Surgeon: Loreli Slot, MD;  Location: [facility] OR;  Service: Thoracic;  Laterality: Right;   VIDEO ASSISTED THORACOSCOPY Right 07/31/2016   Procedure:  VIDEO ASSISTED THORACOSCOPY;  Surgeon: Loreli Slot, MD;  Location: [facility] OR;  Service: Thoracic;  Laterality: Right;   VIDEO ASSISTED THORACOSCOPY Right 08/04/2016   Procedure: REDO VIDEO ASSISTED THORACOSCOPY- EVACUATION OF HEMOTHORAX;  Surgeon: Loreli Slot, MD;  Location: [facility] OR;  Service: Thoracic;  Laterality: Right;   VIDEO BRONCHOSCOPY WITH ENDOBRONCHIAL NAVIGATION N/A 03/11/2014   Procedure: VIDEO  BRONCHOSCOPY WITH ENDOBRONCHIAL NAVIGATION;  Surgeon: Leslye Peer, MD;  Location: [facility] OR;  Service: Thoracic;  Laterality: N/A;   Patient Active Problem List   Diagnosis Date Noted   Elevated PSA 09/24/2022   Right ear pain 09/24/2022   RUQ pain 03/27/2022   BPH associated with nocturia 09/24/2021   Arm paresthesia, left 07/10/2021   Aortic atherosclerosis (HCC) 09/08/2020   Chronic anticoagulation 12/27/2019   Acute upper GI bleed 09/30/2019   Hiatal hernia    Gastritis and gastroduodenitis    Duodenal nodule    Coagulopathy (HCC) 09/28/2019   Hypokalemia 09/06/2019   Dysphagia 09/05/2019   Multiple contusions 12/11/2018   Vitamin D deficiency 12/11/2018   History of DVT (deep vein thrombosis) 11/25/2018   Primary osteoarthritis of left knee 02/01/2018   Insomnia 07/13/2017   ATN (acute tubular necrosis) (HCC)    Melena    Balance problems 06/13/2017   Acute hearing loss, right 06/13/2017   Dehydration 06/13/2017   Symptomatic anemia 06/13/2017   Hx of pulmonary embolus 06/13/2017   Thrombocytopenia (HCC) 06/13/2017   Alcohol abuse 06/13/2017   AKI (acute kidney injury) (HCC) 06/13/2017   Lung blebs (HCC) 07/31/2016   Acute DVT (deep venous thrombosis) (HCC) 07/25/2016   Pneumothorax on right 07/23/2016   CKD (chronic kidney disease), stage III (HCC) 07/23/2016   Gross hematuria 04/25/2016   Dysfunction of both eustachian tubes 10/06/2015   UTI (urinary tract infection) 08/26/2015   Bilateral hearing loss 08/26/2015   Coronary artery calcification seen on CT scan 06/01/2015   Low back pain 06/01/2015   Hyperglycemia 12/18/2014   Anemia 05/26/2014   Cerebellar stroke (HCC) 05/26/2014   Brain contusion (HCC) 04/23/2014   Lung nodule 03/09/2014   Acute pulmonary embolism (HCC) 02/18/2014   Multiple nodules of lung 02/18/2014   Cough 12/24/2013   Allergic rhinitis 12/24/2013   Other chest pain 12/24/2013   Increased prostate specific antigen (PSA) velocity  11/20/2013   Eustachian tube dysfunction 11/20/2013   Hearing loss on right 11/20/2013   Serous otitis media 11/15/2013   Muscle tightness 10/02/2013   Rotator cuff injury 09/17/2013   Skin lesion 07/25/2013   Anal pain 12/18/2012   Habitual alcohol use 11/17/2012   Cellulitis and abscess of buttock 11/15/2012   Cluster headaches 06/07/2012   BPH (benign prostatic hyperplasia) 11/22/2010   Abnormal LFTs 11/14/2010   Right inguinal hernia 09/16/2010   Nocturia 08/29/2010   Preventative health care 08/28/2010   FREQUENCY, URINARY 12/24/2009   COPD (chronic obstructive pulmonary disease) (HCC) 05/12/2009   ABNORMAL ELECTROCARDIOGRAM 11/02/2008   Solitary pulmonary nodule 05/14/2008   Cramp of limb 07/19/2007   Diverticulosis of colon 03/07/2007   Hyperlipidemia 10/23/2006   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/23/2006   Essential hypertension 10/23/2006   Unspecified Peripheral Vascular Disease 10/23/2006   Hemorrhoids 10/23/2006   Abscess of lung (HCC) 10/23/2006   GERD 10/23/2006    ONSET DATE: 11/03/2022  REFERRING DIAG: E55.9 (ICD-10-CM) - Vitamin D deficiency R26.89 (ICD-10-CM) - Imbalance F10.10 (ICD-10-CM) - Alcohol abuse I63.9 (ICD-10-CM) - Cerebellar stroke (HCC) G62.9 (ICD-10-CM) - Polyneuropathy  THERAPY DIAG:  Unsteadiness on  feet  Other abnormalities of gait and mobility  Rationale for Evaluation and Treatment: Rehabilitation  SUBJECTIVE:                                                                                                                                                                                             SUBJECTIVE STATEMENT: Notes balance is getting better, feels like his legs are getting stronger. Still working on getting a balance pad.   Pt accompanied by: self  PERTINENT HISTORY: PMH: cerebellar stroke, HTN, HLD, CAD, PVD, left plantar fasciitis, hearing loss, former smoker, neuropathy who presents to neurology clinic with worsening ambulation, hx  of right cerebellar stroke.    Per Dr. Loleta Chance: Patient noticed balance problems about a month ago. When he gets up he feels off balance for a few seconds. When he walks, he feels his stance is wider and that he may fall to the left while walking. He has not fallen. He denies any pain. He will sit down for a few minutes and may have some improve. He resets himself. He does not have clear spinning or dizziness. He endorses cramps in his calves, but denies numbness and tingling.   Patient's symptoms of imbalance and impaired ambulation is likely multifactorial with contributions from prior right cerebellar stroke, right inner ear dysfunction, and likely peripheral neuropathy.   PAIN:  Are you having pain? No  PRECAUTIONS: Fall  WEIGHT BEARING RESTRICTIONS: No  FALLS: Has patient fallen in last 6 months? No  LIVING ENVIRONMENT: Lives with: lives with their spouse Lives in: House/apartment Stairs: Yes: Internal: 13 steps; on right going up and External: 2 steps; none Has following equipment at home: Single point cane, Walker - 4 wheeled, and Grab bars  PLOF: Independent  PATIENT GOALS: Improve the balance   OBJECTIVE:   DIAGNOSTIC FINDINGS: Right cerebellar infarct was noted on MRI brain from 04/20/2014 as remote. MRI brain on 09/23/22 showed no new infarct. Bilateral mastoid effusions were noted.   COGNITION: Overall cognitive status: Within functional limits for tasks assessed   SENSATION: Light touch: WFL Pt able to detect light touch to ankles, but impaired sensation in toes due to neuropathy   COORDINATION: Heel to shin: WNL bilat    POSTURE: No Significant postural limitations  LOWER EXTREMITY MMT:    MMT Right Eval Left Eval  Hip flexion 5 5  Hip extension    Hip abduction 4+ 4+  Hip adduction 4+ 4+  Hip internal rotation    Hip external rotation    Knee flexion 4+ 5  Knee extension 5 5  Ankle dorsiflexion 5 5  Ankle plantarflexion  Ankle inversion    Ankle  eversion    (Blank rows = not tested)  All tested in sitting    TRANSFERS: Assistive device utilized: None  Sit to stand: CGA/SBA Stand to sit: SBA SBA when performing with BUE support  CGA with no UE support to stand, performs with wide BOS  GAIT: Gait pattern: step through pattern, decreased trunk rotation, wide BOS, and poor foot clearance- Right Distance walked: Clinic distances  Assistive device utilized: None Level of assistance: SBA Comments: Pt with one instance of getting R toe caught, but able to keep his balance. Pt reports that this is what happens to him at home.   FUNCTIONAL TESTS:  5 times sit to stand: 16.7 seconds with BUE support  10 meter walk test: 10.75 seconds = 3.05 ft/sec    M-CTSIB  Condition 1: Firm Surface, EO 30 Sec, Normal Sway  Condition 2: Firm Surface, EC 30 Sec, Mild Sway  Condition 3: Foam Surface, EO 30 Sec, Mild Sway  Condition 4: Foam Surface, EC 9.5 Sec      TODAY'S TREATMENT:                                                                                                                               There were no vitals filed for this visit.  Therapeutic Exercise: SciFit Multi-peaks with BUE/BLE at Gear 5.5 > 6.5  for 8 minutes for strengthening, activity tolerance, aerobic warmup. Pt reporting RPE as 5-6/10  Side stepping with green t-band around thighs > ankles, down and back x4 reps in // bars, cues for technique and holding mini squat position  With green t-band around thighs, marching forwards/backwards in // bars, with focus on slowed and controlled, performed down and back x4 reps  Sit to stands from standard chair: 2 x 5 reps, pt able to perform without UE support, performs with wide BOS, intermittent bracing against chair  NMR: On thicker blue foam: 1/2 tandem stance bilaterally and ball toss with therapist, 2 x 10 reps each leg EC wide BOS > feet hip width 3 x 30 seconds, intermittent taps to balance during first rep,  improved with subsequent reps  PATIENT EDUCATION: Education details: Continue HEP - added side stepping with green t-band around ankles for hip ABD strengthening Person educated: Patient Education method: Explanation, Demonstration, Verbal cues, and Handouts Education comprehension: verbalized understanding, returned demonstration, and verbal cues required   HOME EXERCISE PROGRAM: Verbally added sit <> stands from a slightly higher surface  Access Code: [email or web address] URL: [email or web address] Date: 12/06/2022 Prepared by: Sherlie Ban  Exercises - Gastroc Stretch on Wall  - 2 x daily - 7 x weekly - 1 sets - 3 reps - 30-60 second  hold - Soleus Stretch on Wall  - 2 x daily - 7 x weekly - 1 sets - 3 reps - 30-60 second hold - Clamshell  - 1 x daily - 4 x weekly - 3 sets -  10 reps - Half Tandem Stance Balance with Eyes Closed  - 1 x daily - 7 x weekly - 1 sets - 3 reps - 30 second hold - Wide Stance with Eyes Closed on Foam Pad  - 1-2 x daily - 5 x weekly - 2 sets - 5 reps - Side Stepping with Resistance at Ankles and Counter Support  - 1-2 x daily - 5 x weekly - 3 sets  GOALS: Goals reviewed with patient? Yes  SHORT TERM GOALS: ALL STGS = LTGS  LONG TERM GOALS: Target date: 12/12/2022  Pt will be independent with final HEP in order to build upon functional gains made in therapy. Baseline:  Goal status: INITIAL  2.  Pt will improve FGA to at least a 26/30 in order to demo decr fall risk.  Baseline: 23/30 Goal status: INITIAL  3.  Pt will improve 5x sit<>stand to less than or equal to 14.5 sec to demonstrate improved functional strength and transfer efficiency.  Baseline: 16.7 seconds with BUE support Goal status: INITIAL  4.  Pt will improve condition 4 of mCTSIB to at least 20 seconds to demo improved vestibular input for balance.  Baseline: 9.5 seconds  Goal status: INITIAL    ASSESSMENT:  CLINICAL IMPRESSION:  Today's skilled session focused on BLE  strengthening, standing balance with narrow BOS and eyes closed. Added side stepping with t-band for hip ABD strengthening. Pt able to perform sit <> stands again today from a lower surface with no UE support, but does perform with more wide BOS. Will continue per POC.    OBJECTIVE IMPAIRMENTS: Abnormal gait, decreased activity tolerance, decreased balance, decreased mobility, decreased strength, dizziness, and impaired sensation.   ACTIVITY LIMITATIONS: carrying, bending, stairs, transfers, and locomotion level  PARTICIPATION LIMITATIONS: community activity  PERSONAL FACTORS: Age, Past/current experiences, Time since onset of injury/illness/exacerbation, and 3+ comorbidities: cerebellar stroke, HTN, HLD, CAD, PVD, left plantar fasciitis, hearing loss, former smoker, neuropathy   are also affecting patient's functional outcome.   REHAB POTENTIAL: Good  CLINICAL DECISION MAKING: Evolving/moderate complexity  EVALUATION COMPLEXITY: Moderate  PLAN:  PT FREQUENCY: 2x/week  PT DURATION: 8 weeks - due to delay in scheduling, just predict 4 weeks   PLANNED INTERVENTIONS: Therapeutic exercises, Therapeutic activity, Neuromuscular re-education, Balance training, Gait training, Patient/Family education, Self Care, Joint mobilization, Stair training, Vestibular training, DME instructions, and Re-evaluation  PLAN FOR NEXT SESSION: Add to HEP as needed. Work on balance with head motions, narrow BOS, unlevel surfaces, and EC. Work on stepping reactions with dynamic episodes of stability, resisted gait with perturbations could try outdoors on unlevel softer surface for increased challenge  Check goals next week and anticipate D/C  Drake Leach, PT, DPT 12/06/2022, 9:30 AM

## 2022-12-07 ENCOUNTER — Ambulatory Visit (INDEPENDENT_AMBULATORY_CARE_PROVIDER_SITE_OTHER): Payer: Medicare Other | Admitting: Audiology

## 2022-12-11 ENCOUNTER — Encounter: Payer: Self-pay | Admitting: Physical Therapy

## 2022-12-11 ENCOUNTER — Ambulatory Visit: Payer: Medicare Other | Admitting: Physical Therapy

## 2022-12-11 VITALS — BP 179/87 | HR 88

## 2022-12-11 DIAGNOSIS — R2681 Unsteadiness on feet: Secondary | ICD-10-CM

## 2022-12-11 DIAGNOSIS — R2689 Other abnormalities of gait and mobility: Secondary | ICD-10-CM

## 2022-12-11 NOTE — Therapy (Signed)
OUTPATIENT PHYSICAL THERAPY NEURO TREATMENT   Patient Name: Charles [name] MRN: [id number] DOB:[birth year], 72 y.o., male Today's Date: 12/11/2022   PCP: Corwin Levins, MD   REFERRING PROVIDER:  Antony Madura, MD  12/11/22 8:43 AM   END OF SESSION:  PT End of Session - 12/11/22 0805     Visit Number 8    Number of Visits 9    Date for PT Re-Evaluation 01/13/23    Authorization Type Medicare    PT Start Time 0803    PT Stop Time 0833    PT Time Calculation (min) 30 min    Equipment Utilized During Treatment Gait belt    Activity Tolerance Patient tolerated treatment well    Behavior During Therapy [facility] for tasks assessed/performed             Past Medical History:  Diagnosis Date   ABNORMAL ELECTROCARDIOGRAM 11/02/2008   ABSCESS, LUNG 10/23/2006   Anemia 05/26/2014   BACTERIAL PNEUMONIA 12/28/2009   BPH (benign prostatic hyperplasia) 11/22/2010   Cerebellar stroke (HCC) 05/26/2014   CHEST PAIN 12/24/2009   Coronary artery calcification seen on CT scan 06/01/2015   Cramp of limb 07/19/2007   DIVERTICULOSIS, COLON 03/07/2007   DVT (deep venous thrombosis) (HCC) 01/2014   LLE   Emphysema of lung (HCC)    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/23/2006   FLANK PAIN, LEFT 02/25/2010   FREQUENCY, URINARY 12/24/2009   HEMORRHOIDS 10/23/2006   HYPERLIPIDEMIA 10/23/2006   HYPERTENSION 10/23/2006   PE (pulmonary embolism) 02/2014   PLANTAR FASCIITIS, LEFT 11/02/2008   Pneumonia 12/2013 X 2   PSA, INCREASED 11/20/2007   PULMONARY NODULE 05/14/2008   Unspecified Peripheral Vascular Disease 10/23/2006   Past Surgical History:  Procedure Laterality Date   BIOPSY  09/29/2019   Procedure: BIOPSY;  Surgeon: Beverley Fiedler, MD;  Location: [facility];  Service: Endoscopy;;   BIOPSY  12/25/2019   Procedure: BIOPSY;  Surgeon: Rachael Fee, MD;  Location: [facility];  Service: Endoscopy;;   COLONOSCOPY     ESOPHAGOGASTRODUODENOSCOPY (EGD) WITH PROPOFOL N/A 06/15/2017   Procedure: ESOPHAGOGASTRODUODENOSCOPY  (EGD) WITH PROPOFOL;  Surgeon: Beverley Fiedler, MD;  Location: [facility];  Service: Gastroenterology;  Laterality: N/A;   ESOPHAGOGASTRODUODENOSCOPY (EGD) WITH PROPOFOL N/A 09/29/2019   Procedure: ESOPHAGOGASTRODUODENOSCOPY (EGD) WITH PROPOFOL;  Surgeon: Beverley Fiedler, MD;  Location: [facility];  Service: Endoscopy;  Laterality: N/A;   ESOPHAGOGASTRODUODENOSCOPY (EGD) WITH PROPOFOL N/A 12/25/2019   Procedure: ESOPHAGOGASTRODUODENOSCOPY (EGD) WITH PROPOFOL;  Surgeon: Rachael Fee, MD;  Location: [facility];  Service: Endoscopy;  Laterality: N/A;   EUS N/A 12/25/2019   Procedure: UPPER ENDOSCOPIC ULTRASOUND (EUS) RADIAL;  Surgeon: Rachael Fee, MD;  Location: [facility];  Service: Endoscopy;  Laterality: N/A;   HEMORRHOID SURGERY  ?1990's   INGUINAL HERNIA REPAIR Bilateral ?2000's   IVC FILTER INSERTION N/A 10/03/2016   Procedure: IVC Filter Retrieveal;  Surgeon: Nada Libman, MD;  Location: [facility];  Service: Cardiovascular;  Laterality: N/A;   MYRINGOTOMY WITH TUBE PLACEMENT Right 06/26/2018   SHOULDER ARTHROSCOPY W/ ROTATOR CUFF REPAIR Right 11/2013   STAPLING OF BLEBS Right 07/31/2016   Procedure: BLEBECTOMY;  Surgeon: Loreli Slot, MD;  Location: [facility] OR;  Service: Thoracic;  Laterality: Right;   VENA CAVA FILTER PLACEMENT Right 08/04/2016   Procedure: INSERTION VENA-CAVA FILTER;  Surgeon: Loreli Slot, MD;  Location: [facility] OR;  Service: Thoracic;  Laterality: Right;   VIDEO ASSISTED THORACOSCOPY Right 07/31/2016   Procedure:  VIDEO ASSISTED THORACOSCOPY;  Surgeon: Loreli Slot, MD;  Location: [facility] OR;  Service: Thoracic;  Laterality: Right;   VIDEO ASSISTED THORACOSCOPY Right 08/04/2016   Procedure: REDO VIDEO ASSISTED THORACOSCOPY- EVACUATION OF HEMOTHORAX;  Surgeon: Loreli Slot, MD;  Location: [facility] OR;  Service: Thoracic;  Laterality: Right;   VIDEO BRONCHOSCOPY WITH ENDOBRONCHIAL NAVIGATION N/A 03/11/2014   Procedure: VIDEO  BRONCHOSCOPY WITH ENDOBRONCHIAL NAVIGATION;  Surgeon: Leslye Peer, MD;  Location: [facility] OR;  Service: Thoracic;  Laterality: N/A;   Patient Active Problem List   Diagnosis Date Noted   Elevated PSA 09/24/2022   Right ear pain 09/24/2022   RUQ pain 03/27/2022   BPH associated with nocturia 09/24/2021   Arm paresthesia, left 07/10/2021   Aortic atherosclerosis (HCC) 09/08/2020   Chronic anticoagulation 12/27/2019   Acute upper GI bleed 09/30/2019   Hiatal hernia    Gastritis and gastroduodenitis    Duodenal nodule    Coagulopathy (HCC) 09/28/2019   Hypokalemia 09/06/2019   Dysphagia 09/05/2019   Multiple contusions 12/11/2018   Vitamin D deficiency 12/11/2018   History of DVT (deep vein thrombosis) 11/25/2018   Primary osteoarthritis of left knee 02/01/2018   Insomnia 07/13/2017   ATN (acute tubular necrosis) (HCC)    Melena    Balance problems 06/13/2017   Acute hearing loss, right 06/13/2017   Dehydration 06/13/2017   Symptomatic anemia 06/13/2017   Hx of pulmonary embolus 06/13/2017   Thrombocytopenia (HCC) 06/13/2017   Alcohol abuse 06/13/2017   AKI (acute kidney injury) (HCC) 06/13/2017   Lung blebs (HCC) 07/31/2016   Acute DVT (deep venous thrombosis) (HCC) 07/25/2016   Pneumothorax on right 07/23/2016   CKD (chronic kidney disease), stage III (HCC) 07/23/2016   Gross hematuria 04/25/2016   Dysfunction of both eustachian tubes 10/06/2015   UTI (urinary tract infection) 08/26/2015   Bilateral hearing loss 08/26/2015   Coronary artery calcification seen on CT scan 06/01/2015   Low back pain 06/01/2015   Hyperglycemia 12/18/2014   Anemia 05/26/2014   Cerebellar stroke (HCC) 05/26/2014   Brain contusion (HCC) 04/23/2014   Lung nodule 03/09/2014   Acute pulmonary embolism (HCC) 02/18/2014   Multiple nodules of lung 02/18/2014   Cough 12/24/2013   Allergic rhinitis 12/24/2013   Other chest pain 12/24/2013   Increased prostate specific antigen (PSA) velocity  11/20/2013   Eustachian tube dysfunction 11/20/2013   Hearing loss on right 11/20/2013   Serous otitis media 11/15/2013   Muscle tightness 10/02/2013   Rotator cuff injury 09/17/2013   Skin lesion 07/25/2013   Anal pain 12/18/2012   Habitual alcohol use 11/17/2012   Cellulitis and abscess of buttock 11/15/2012   Cluster headaches 06/07/2012   BPH (benign prostatic hyperplasia) 11/22/2010   Abnormal LFTs 11/14/2010   Right inguinal hernia 09/16/2010   Nocturia 08/29/2010   Preventative health care 08/28/2010   FREQUENCY, URINARY 12/24/2009   COPD (chronic obstructive pulmonary disease) (HCC) 05/12/2009   ABNORMAL ELECTROCARDIOGRAM 11/02/2008   Solitary pulmonary nodule 05/14/2008   Cramp of limb 07/19/2007   Diverticulosis of colon 03/07/2007   Hyperlipidemia 10/23/2006   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/23/2006   Essential hypertension 10/23/2006   Unspecified Peripheral Vascular Disease 10/23/2006   Hemorrhoids 10/23/2006   Abscess of lung (HCC) 10/23/2006   GERD 10/23/2006    ONSET DATE: 11/03/2022  REFERRING DIAG: E55.9 (ICD-10-CM) - Vitamin D deficiency R26.89 (ICD-10-CM) - Imbalance F10.10 (ICD-10-CM) - Alcohol abuse I63.9 (ICD-10-CM) - Cerebellar stroke (HCC) G62.9 (ICD-10-CM) - Polyneuropathy  THERAPY DIAG:  Unsteadiness on  feet  Other abnormalities of gait and mobility  Rationale for Evaluation and Treatment: Rehabilitation  SUBJECTIVE:                                                                                                                                                                                             SUBJECTIVE STATEMENT: Patient reports he was able to get a piece of foam this weekend. States he has to leave early today due to another appointment. Did take BP medication this morning around 7:00am. Denies falls/near falls.   Pt accompanied by: self  PERTINENT HISTORY: PMH: cerebellar stroke, HTN, HLD, CAD, PVD, left plantar fasciitis, hearing loss,  former smoker, neuropathy who presents to neurology clinic with worsening ambulation, hx of right cerebellar stroke.    Per Dr. Loleta Chance: Patient noticed balance problems about a month ago. When he gets up he feels off balance for a few seconds. When he walks, he feels his stance is wider and that he may fall to the left while walking. He has not fallen. He denies any pain. He will sit down for a few minutes and may have some improve. He resets himself. He does not have clear spinning or dizziness. He endorses cramps in his calves, but denies numbness and tingling.   Patient's symptoms of imbalance and impaired ambulation is likely multifactorial with contributions from prior right cerebellar stroke, right inner ear dysfunction, and likely peripheral neuropathy.   PAIN:  Are you having pain? No  PRECAUTIONS: Fall  WEIGHT BEARING RESTRICTIONS: No  FALLS: Has patient fallen in last 6 months? No  LIVING ENVIRONMENT: Lives with: lives with their spouse Lives in: House/apartment Stairs: Yes: Internal: 13 steps; on right going up and External: 2 steps; none Has following equipment at home: Single point cane, Walker - 4 wheeled, and Grab bars  PLOF: Independent  PATIENT GOALS: Improve the balance   OBJECTIVE:   DIAGNOSTIC FINDINGS: Right cerebellar infarct was noted on MRI brain from 04/20/2014 as remote. MRI brain on 09/23/22 showed no new infarct. Bilateral mastoid effusions were noted.   COGNITION: Overall cognitive status: Within functional limits for tasks assessed   SENSATION: Light touch: WFL Pt able to detect light touch to ankles, but impaired sensation in toes due to neuropathy   COORDINATION: Heel to shin: WNL bilat    POSTURE: No Significant postural limitations  LOWER EXTREMITY MMT:    MMT Right Eval Left Eval  Hip flexion 5 5  Hip extension    Hip abduction 4+ 4+  Hip adduction 4+ 4+  Hip internal rotation    Hip external rotation    Knee  flexion 4+ 5  Knee  extension 5 5  Ankle dorsiflexion 5 5  Ankle plantarflexion    Ankle inversion    Ankle eversion    (Blank rows = not tested)  All tested in sitting    TRANSFERS: Assistive device utilized: None  Sit to stand: CGA/SBA Stand to sit: SBA SBA when performing with BUE support  CGA with no UE support to stand, performs with wide BOS  GAIT: Gait pattern: step through pattern, decreased trunk rotation, wide BOS, and poor foot clearance- Right Distance walked: Clinic distances  Assistive device utilized: None Level of assistance: SBA Comments: Pt with one instance of getting R toe caught, but able to keep his balance. Pt reports that this is what happens to him at home.   FUNCTIONAL TESTS:  5 times sit to stand: 16.7 seconds with BUE support  10 meter walk test: 10.75 seconds = 3.05 ft/sec    M-CTSIB  Condition 1: Firm Surface, EO 30 Sec, Normal Sway  Condition 2: Firm Surface, EC 30 Sec, Mild Sway  Condition 3: Foam Surface, EO 30 Sec, Mild Sway  Condition 4: Foam Surface, EC 9.5 Sec      TODAY'S TREATMENT:                                                                                                                               Vitals:   12/11/22 0808 12/11/22 0831  BP: (!) 169/96 (!) 179/87  Pulse:  88   TherAct: Assessed vitals; seated on RUE. Recommended follow up for PCP management. Initial reading at start of session; final after light exercise. Discussed signs of stroke and when to go to ED if became symptomatic. Patient asymptomatic trough session.   NMR: Between // bars with CGA Standing on airex with cone taps 3 x 10 bil Standing on airex with modified SLS with cone tap hold 3 x 10" bilaterally Standing on airex tandem stance with tap forward to cone with back leg and return 2 x 10 Standing on firm, cone knock over and return to upright 2 x 10  Lateral stepping on airex with taps to black foam blocks forward and back 2 x 8 feet (CGA-minA)   PATIENT  EDUCATION: Education details: Continue HEP - added side stepping with green t-band around ankles for hip ABD strengthening Person educated: Patient Education method: Explanation, Demonstration, Verbal cues, and Handouts Education comprehension: verbalized understanding, returned demonstration, and verbal cues required   HOME EXERCISE PROGRAM: Verbally added sit <> stands from a slightly higher surface  Access Code: [email or web address] URL: [email or web address] Date: 12/06/2022 Prepared by: Sherlie Ban  Exercises - Gastroc Stretch on Wall  - 2 x daily - 7 x weekly - 1 sets - 3 reps - 30-60 second  hold - Soleus Stretch on Wall  - 2 x daily - 7 x weekly - 1 sets - 3 reps - 30-60 second hold - Clamshell  - 1 x daily -  4 x weekly - 3 sets - 10 reps - Half Tandem Stance Balance with Eyes Closed  - 1 x daily - 7 x weekly - 1 sets - 3 reps - 30 second hold - Wide Stance with Eyes Closed on Foam Pad  - 1-2 x daily - 5 x weekly - 2 sets - 5 reps - Side Stepping with Resistance at Ankles and Counter Support  - 1-2 x daily - 5 x weekly - 3 sets  GOALS: Goals reviewed with patient? Yes  SHORT TERM GOALS: ALL STGS = LTGS  LONG TERM GOALS: Target date: 12/12/2022  Pt will be independent with final HEP in order to build upon functional gains made in therapy. Baseline:  Goal status: INITIAL  2.  Pt will improve FGA to at least a 26/30 in order to demo decr fall risk.  Baseline: 23/30 Goal status: INITIAL  3.  Pt will improve 5x sit<>stand to less than or equal to 14.5 sec to demonstrate improved functional strength and transfer efficiency.  Baseline: 16.7 seconds with BUE support Goal status: INITIAL  4.  Pt will improve condition 4 of mCTSIB to at least 20 seconds to demo improved vestibular input for balance.  Baseline: 9.5 seconds  Goal status: INITIAL    ASSESSMENT:  CLINICAL IMPRESSION:  Today's skilled session focused on balance on unstable surface with strong  emphasis on modified SLS activities. Patient tolerated well; greatest challenge on narrow compliant surfaces but balance strategies notably improved from when patient first started therapy with reduced reliance on UE and improved integration of ankle/hip strategies. Will continue per POC.    OBJECTIVE IMPAIRMENTS: Abnormal gait, decreased activity tolerance, decreased balance, decreased mobility, decreased strength, dizziness, and impaired sensation.   ACTIVITY LIMITATIONS: carrying, bending, stairs, transfers, and locomotion level  PARTICIPATION LIMITATIONS: community activity  PERSONAL FACTORS: Age, Past/current experiences, Time since onset of injury/illness/exacerbation, and 3+ comorbidities: cerebellar stroke, HTN, HLD, CAD, PVD, left plantar fasciitis, hearing loss, former smoker, neuropathy   are also affecting patient's functional outcome.   REHAB POTENTIAL: Good  CLINICAL DECISION MAKING: Evolving/moderate complexity  EVALUATION COMPLEXITY: Moderate  PLAN:  PT FREQUENCY: 2x/week  PT DURATION: 8 weeks - due to delay in scheduling, just predict 4 weeks   PLANNED INTERVENTIONS: Therapeutic exercises, Therapeutic activity, Neuromuscular re-education, Balance training, Gait training, Patient/Family education, Self Care, Joint mobilization, Stair training, Vestibular training, DME instructions, and Re-evaluation  PLAN FOR NEXT SESSION: Assess goals and D/C  Carmelia Bake, PT, DPT 12/11/2022, 8:43 AM

## 2022-12-13 ENCOUNTER — Ambulatory Visit: Payer: Medicare Other | Admitting: Physical Therapy

## 2022-12-13 ENCOUNTER — Encounter: Payer: Self-pay | Admitting: Physical Therapy

## 2022-12-13 VITALS — BP 156/75 | HR 64

## 2022-12-13 DIAGNOSIS — R2681 Unsteadiness on feet: Secondary | ICD-10-CM | POA: Diagnosis not present

## 2022-12-13 DIAGNOSIS — R2689 Other abnormalities of gait and mobility: Secondary | ICD-10-CM

## 2022-12-13 NOTE — Therapy (Signed)
OUTPATIENT PHYSICAL THERAPY NEURO TREATMENT/DISCHARGE SUMMARY   Patient Name: Charles [name] MRN: [id number] DOB:1950-04-10, 72 y.o., male Today's Date: 12/13/2022   PCP: Corwin Levins, MD   REFERRING PROVIDER:  Antony Madura, MD  12/13/22 8:39 AM   END OF SESSION:  PT End of Session - 12/13/22 0805     Visit Number 9    Number of Visits 9    Date for PT Re-Evaluation 01/13/23    Authorization Type Medicare    PT Start Time 0803    PT Stop Time 0833   full time not used due to D/C session   PT Time Calculation (min) 30 min    Equipment Utilized During Treatment Gait belt    Activity Tolerance Patient tolerated treatment well    Behavior During Therapy WFL for tasks assessed/performed             Past Medical History:  Diagnosis Date   ABNORMAL ELECTROCARDIOGRAM 11/02/2008   ABSCESS, LUNG 10/23/2006   Anemia 05/26/2014   BACTERIAL PNEUMONIA 12/28/2009   BPH (benign prostatic hyperplasia) 11/22/2010   Cerebellar stroke (HCC) 05/26/2014   CHEST PAIN 12/24/2009   Coronary artery calcification seen on CT scan 06/01/2015   Cramp of limb 07/19/2007   DIVERTICULOSIS, COLON 03/07/2007   DVT (deep venous thrombosis) (HCC) 01/2014   LLE   Emphysema of lung (HCC)    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/23/2006   FLANK PAIN, LEFT 02/25/2010   FREQUENCY, URINARY 12/24/2009   HEMORRHOIDS 10/23/2006   HYPERLIPIDEMIA 10/23/2006   HYPERTENSION 10/23/2006   PE (pulmonary embolism) 02/2014   PLANTAR FASCIITIS, LEFT 11/02/2008   Pneumonia 12/2013 X 2   PSA, INCREASED 11/20/2007   PULMONARY NODULE 05/14/2008   Unspecified Peripheral Vascular Disease 10/23/2006   Past Surgical History:  Procedure Laterality Date   BIOPSY  09/29/2019   Procedure: BIOPSY;  Surgeon: Beverley Fiedler, MD;  Location: [facility];  Service: Endoscopy;;   BIOPSY  12/25/2019   Procedure: BIOPSY;  Surgeon: Rachael Fee, MD;  Location: [facility];  Service: Endoscopy;;   COLONOSCOPY     ESOPHAGOGASTRODUODENOSCOPY (EGD) WITH PROPOFOL  N/A 06/15/2017   Procedure: ESOPHAGOGASTRODUODENOSCOPY (EGD) WITH PROPOFOL;  Surgeon: Beverley Fiedler, MD;  Location: [facility];  Service: Gastroenterology;  Laterality: N/A;   ESOPHAGOGASTRODUODENOSCOPY (EGD) WITH PROPOFOL N/A 09/29/2019   Procedure: ESOPHAGOGASTRODUODENOSCOPY (EGD) WITH PROPOFOL;  Surgeon: Beverley Fiedler, MD;  Location: [facility];  Service: Endoscopy;  Laterality: N/A;   ESOPHAGOGASTRODUODENOSCOPY (EGD) WITH PROPOFOL N/A 12/25/2019   Procedure: ESOPHAGOGASTRODUODENOSCOPY (EGD) WITH PROPOFOL;  Surgeon: Rachael Fee, MD;  Location: [facility];  Service: Endoscopy;  Laterality: N/A;   EUS N/A 12/25/2019   Procedure: UPPER ENDOSCOPIC ULTRASOUND (EUS) RADIAL;  Surgeon: Rachael Fee, MD;  Location: [facility];  Service: Endoscopy;  Laterality: N/A;   HEMORRHOID SURGERY  ?1990's   INGUINAL HERNIA REPAIR Bilateral ?2000's   IVC FILTER INSERTION N/A 10/03/2016   Procedure: IVC Filter Retrieveal;  Surgeon: Nada Libman, MD;  Location: [facility];  Service: Cardiovascular;  Laterality: N/A;   MYRINGOTOMY WITH TUBE PLACEMENT Right 06/26/2018   SHOULDER ARTHROSCOPY W/ ROTATOR CUFF REPAIR Right 11/2013   STAPLING OF BLEBS Right 07/31/2016   Procedure: BLEBECTOMY;  Surgeon: Loreli Slot, MD;  Location: [facility] OR;  Service: Thoracic;  Laterality: Right;   VENA CAVA FILTER PLACEMENT Right 08/04/2016   Procedure: INSERTION VENA-CAVA FILTER;  Surgeon: Loreli Slot, MD;  Location: [facility] OR;  Service: Thoracic;  Laterality: Right;  VIDEO ASSISTED THORACOSCOPY Right 07/31/2016   Procedure: VIDEO ASSISTED THORACOSCOPY;  Surgeon: Loreli Slot, MD;  Location: [facility] OR;  Service: Thoracic;  Laterality: Right;   VIDEO ASSISTED THORACOSCOPY Right 08/04/2016   Procedure: REDO VIDEO ASSISTED THORACOSCOPY- EVACUATION OF HEMOTHORAX;  Surgeon: Loreli Slot, MD;  Location: [facility] OR;  Service: Thoracic;  Laterality: Right;   VIDEO BRONCHOSCOPY WITH  ENDOBRONCHIAL NAVIGATION N/A 03/11/2014   Procedure: VIDEO BRONCHOSCOPY WITH ENDOBRONCHIAL NAVIGATION;  Surgeon: Leslye Peer, MD;  Location: [facility] OR;  Service: Thoracic;  Laterality: N/A;   Patient Active Problem List   Diagnosis Date Noted   Elevated PSA 09/24/2022   Right ear pain 09/24/2022   RUQ pain 03/27/2022   BPH associated with nocturia 09/24/2021   Arm paresthesia, left 07/10/2021   Aortic atherosclerosis (HCC) 09/08/2020   Chronic anticoagulation 12/27/2019   Acute upper GI bleed 09/30/2019   Hiatal hernia    Gastritis and gastroduodenitis    Duodenal nodule    Coagulopathy (HCC) 09/28/2019   Hypokalemia 09/06/2019   Dysphagia 09/05/2019   Multiple contusions 12/11/2018   Vitamin D deficiency 12/11/2018   History of DVT (deep vein thrombosis) 11/25/2018   Primary osteoarthritis of left knee 02/01/2018   Insomnia 07/13/2017   ATN (acute tubular necrosis) (HCC)    Melena    Balance problems 06/13/2017   Acute hearing loss, right 06/13/2017   Dehydration 06/13/2017   Symptomatic anemia 06/13/2017   Hx of pulmonary embolus 06/13/2017   Thrombocytopenia (HCC) 06/13/2017   Alcohol abuse 06/13/2017   AKI (acute kidney injury) (HCC) 06/13/2017   Lung blebs (HCC) 07/31/2016   Acute DVT (deep venous thrombosis) (HCC) 07/25/2016   Pneumothorax on right 07/23/2016   CKD (chronic kidney disease), stage III (HCC) 07/23/2016   Gross hematuria 04/25/2016   Dysfunction of both eustachian tubes 10/06/2015   UTI (urinary tract infection) 08/26/2015   Bilateral hearing loss 08/26/2015   Coronary artery calcification seen on CT scan 06/01/2015   Low back pain 06/01/2015   Hyperglycemia 12/18/2014   Anemia 05/26/2014   Cerebellar stroke (HCC) 05/26/2014   Brain contusion (HCC) 04/23/2014   Lung nodule 03/09/2014   Acute pulmonary embolism (HCC) 02/18/2014   Multiple nodules of lung 02/18/2014   Cough 12/24/2013   Allergic rhinitis 12/24/2013   Other chest pain 12/24/2013    Increased prostate specific antigen (PSA) velocity 11/20/2013   Eustachian tube dysfunction 11/20/2013   Hearing loss on right 11/20/2013   Serous otitis media 11/15/2013   Muscle tightness 10/02/2013   Rotator cuff injury 09/17/2013   Skin lesion 07/25/2013   Anal pain 12/18/2012   Habitual alcohol use 11/17/2012   Cellulitis and abscess of buttock 11/15/2012   Cluster headaches 06/07/2012   BPH (benign prostatic hyperplasia) 11/22/2010   Abnormal LFTs 11/14/2010   Right inguinal hernia 09/16/2010   Nocturia 08/29/2010   Preventative health care 08/28/2010   FREQUENCY, URINARY 12/24/2009   COPD (chronic obstructive pulmonary disease) (HCC) 05/12/2009   ABNORMAL ELECTROCARDIOGRAM 11/02/2008   Solitary pulmonary nodule 05/14/2008   Cramp of limb 07/19/2007   Diverticulosis of colon 03/07/2007   Hyperlipidemia 10/23/2006   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/23/2006   Essential hypertension 10/23/2006   Unspecified Peripheral Vascular Disease 10/23/2006   Hemorrhoids 10/23/2006   Abscess of lung (HCC) 10/23/2006   GERD 10/23/2006    ONSET DATE: 11/03/2022  REFERRING DIAG: E55.9 (ICD-10-CM) - Vitamin D deficiency R26.89 (ICD-10-CM) - Imbalance F10.10 (ICD-10-CM) - Alcohol abuse I63.9 (ICD-10-CM) - Cerebellar stroke (HCC) G62.9 (ICD-10-CM) -  Polyneuropathy  THERAPY DIAG:  Unsteadiness on feet  Other abnormalities of gait and mobility  Rationale for Evaluation and Treatment: Rehabilitation  SUBJECTIVE:                                                                                                                                                                                             SUBJECTIVE STATEMENT: Reports he was doing some steps taps this morning and got a catch in his back.   Pt accompanied by: self  PERTINENT HISTORY: PMH: cerebellar stroke, HTN, HLD, CAD, PVD, left plantar fasciitis, hearing loss, former smoker, neuropathy who presents to neurology clinic with worsening  ambulation, hx of right cerebellar stroke.    Per Dr. Loleta Chance: Patient noticed balance problems about a month ago. When he gets up he feels off balance for a few seconds. When he walks, he feels his stance is wider and that he may fall to the left while walking. He has not fallen. He denies any pain. He will sit down for a few minutes and may have some improve. He resets himself. He does not have clear spinning or dizziness. He endorses cramps in his calves, but denies numbness and tingling.   Patient's symptoms of imbalance and impaired ambulation is likely multifactorial with contributions from prior right cerebellar stroke, right inner ear dysfunction, and likely peripheral neuropathy.   PAIN:  Are you having pain? No  Nothing painful, just has a catch in the L side of his back   PRECAUTIONS: Fall  WEIGHT BEARING RESTRICTIONS: No  FALLS: Has patient fallen in last 6 months? No  LIVING ENVIRONMENT: Lives with: lives with their spouse Lives in: House/apartment Stairs: Yes: Internal: 13 steps; on right going up and External: 2 steps; none Has following equipment at home: Single point cane, Walker - 4 wheeled, and Grab bars  PLOF: Independent  PATIENT GOALS: Improve the balance   OBJECTIVE:   DIAGNOSTIC FINDINGS: Right cerebellar infarct was noted on MRI brain from 04/20/2014 as remote. MRI brain on 09/23/22 showed no new infarct. Bilateral mastoid effusions were noted.   COGNITION: Overall cognitive status: Within functional limits for tasks assessed   SENSATION: Light touch: WFL Pt able to detect light touch to ankles, but impaired sensation in toes due to neuropathy   COORDINATION: Heel to shin: WNL bilat    POSTURE: No Significant postural limitations  LOWER EXTREMITY MMT:    MMT Right Eval Left Eval  Hip flexion 5 5  Hip extension    Hip abduction 4+ 4+  Hip adduction 4+ 4+  Hip internal rotation    Hip external rotation  Knee flexion 4+ 5  Knee extension 5 5   Ankle dorsiflexion 5 5  Ankle plantarflexion    Ankle inversion    Ankle eversion    (Blank rows = not tested)  All tested in sitting    TRANSFERS: Assistive device utilized: None  Sit to stand: CGA/SBA Stand to sit: SBA SBA when performing with BUE support  CGA with no UE support to stand, performs with wide BOS  GAIT: Gait pattern: step through pattern, decreased trunk rotation, wide BOS, and poor foot clearance- Right Distance walked: Clinic distances  Assistive device utilized: None Level of assistance: SBA Comments: Pt with one instance of getting R toe caught, but able to keep his balance. Pt reports that this is what happens to him at home.   FUNCTIONAL TESTS:  5 times sit to stand: 16.7 seconds with BUE support  10 meter walk test: 10.75 seconds = 3.05 ft/sec    M-CTSIB  Condition 1: Firm Surface, EO 30 Sec, Normal Sway  Condition 2: Firm Surface, EC 30 Sec, Mild Sway  Condition 3: Foam Surface, EO 30 Sec, Mild Sway  Condition 4: Foam Surface, EC 9.5 Sec      [facility] PT Assessment - 12/13/22 0812       Functional Gait  Assessment   Gait assessed  Yes    Gait Level Surface Walks 20 ft in less than 7 sec but greater than 5.5 sec, uses assistive device, slower speed, mild gait deviations, or deviates 6-10 in outside of the 12 in walkway width.   5.9   Change in Gait Speed Able to smoothly change walking speed without loss of balance or gait deviation. Deviate no more than 6 in outside of the 12 in walkway width.    Gait with Horizontal Head Turns Performs head turns smoothly with slight change in gait velocity (eg, minor disruption to smooth gait path), deviates 6-10 in outside 12 in walkway width, or uses an assistive device.    Gait with Vertical Head Turns Performs head turns with no change in gait. Deviates no more than 6 in outside 12 in walkway width.    Gait and Pivot Turn Pivot turns safely within 3 sec and stops quickly with no loss of balance.    Step Over  Obstacle Is able to step over 2 stacked shoe boxes taped together (9 in total height) without changing gait speed. No evidence of imbalance.    Gait with Narrow Base of Support Ambulates 7-9 steps.   7 steps   Gait with Eyes Closed Walks 20 ft, uses assistive device, slower speed, mild gait deviations, deviates 6-10 in outside 12 in walkway width. Ambulates 20 ft in less than 9 sec but greater than 7 sec.    Ambulating Backwards Walks 20 ft, no assistive devices, good speed, no evidence for imbalance, normal gait    Steps Alternating feet, must use rail.    Total Score 25    FGA comment: 25/30 = Low Fall Risk            TODAY'S TREATMENT:  Therapeutic Activity:  Vitals:   12/13/22 0808  BP: (!) 156/75  Pulse: 64    Goal Assessment: 5x sit <> stand: 18.2 seconds with no UE support and wide BOS, on last rep needing CGA/min A for balance as pt with posterior bracing/almost loss of balance   13.9 seconds with BUE support, more normal BOS    M-CTSIB  Condition 1: Firm Surface, EO 30 Sec, Normal Sway  Condition 2: Firm Surface, EC 30 Sec, Mild Sway  Condition 3: Foam Surface, EO 30 Sec, Mild Sway  Condition 4: Foam Surface, EC 13 Sec, Mod Sway      [facility] PT Assessment - 12/13/22 0812       Functional Gait  Assessment   Gait assessed  Yes    Gait Level Surface Walks 20 ft in less than 7 sec but greater than 5.5 sec, uses assistive device, slower speed, mild gait deviations, or deviates 6-10 in outside of the 12 in walkway width.   5.9   Change in Gait Speed Able to smoothly change walking speed without loss of balance or gait deviation. Deviate no more than 6 in outside of the 12 in walkway width.    Gait with Horizontal Head Turns Performs head turns smoothly with slight change in gait velocity (eg, minor disruption to smooth gait path), deviates 6-10 in  outside 12 in walkway width, or uses an assistive device.    Gait with Vertical Head Turns Performs head turns with no change in gait. Deviates no more than 6 in outside 12 in walkway width.    Gait and Pivot Turn Pivot turns safely within 3 sec and stops quickly with no loss of balance.    Step Over Obstacle Is able to step over 2 stacked shoe boxes taped together (9 in total height) without changing gait speed. No evidence of imbalance.    Gait with Narrow Base of Support Ambulates 7-9 steps.   7 steps   Gait with Eyes Closed Walks 20 ft, uses assistive device, slower speed, mild gait deviations, deviates 6-10 in outside 12 in walkway width. Ambulates 20 ft in less than 9 sec but greater than 7 sec.    Ambulating Backwards Walks 20 ft, no assistive devices, good speed, no evidence for imbalance, normal gait    Steps Alternating feet, must use rail.    Total Score 25    FGA comment: 25/30 = Low Fall Risk             Reviewed final HEP - see section below and provided updated handout   PATIENT EDUCATION: Education details: Results of goals, continue HEP, D/C from PT  Person educated: Patient Education method: Programmer, multimedia, Demonstration, Verbal cues, and Handouts Education comprehension: verbalized understanding, returned demonstration, and verbal cues required   PHYSICAL THERAPY DISCHARGE SUMMARY  Visits from Start of Care: 9  Current functional level related to goals / functional outcomes: See LTGs/Clinical Assessment Statement   Remaining deficits: Impaired high level balance, decr vestibular input for balance, decr functional strength   Education / Equipment: HEP   Patient agrees to discharge. Patient goals were partially met. Patient is being discharged due to being pleased with current functional level/meeting goals.   HOME EXERCISE PROGRAM:  Access Code: [email or web address] URL: [email or web address] Date: 12/13/2022 Prepared by: Sherlie Ban  Exercises - Gastroc Stretch on Wall  - 2 x daily - 7 x weekly - 1 sets - 3 reps - 30-60 second  hold - Soleus Stretch on Wall  -  2 x daily - 7 x weekly - 1 sets - 3 reps - 30-60 second hold - Half Tandem Stance Balance with Eyes Closed  - 1 x daily - 7 x weekly - 1 sets - 3 reps - 30 second hold - Wide Stance with Eyes Closed on Foam Pad  - 1-2 x daily - 5 x weekly - 2 sets - 5 reps - Side Stepping with Resistance at Ankles and Counter Support  - 1-2 x daily - 5 x weekly - 3 sets - Sit to Stand with Armchair  - 1 x daily - 5 x weekly - 1-2 sets - 10 reps - Standing Balance with Eyes Closed on Foam  - 1 x daily - 5 x weekly - 3 sets - 30 hold  GOALS: Goals reviewed with patient? Yes  SHORT TERM GOALS: ALL STGS = LTGS  LONG TERM GOALS: Target date: 12/12/2022  Pt will be independent with final HEP in order to build upon functional gains made in therapy. Baseline:  Goal status: MET  2.  Pt will improve FGA to at least a 26/30 in order to demo decr fall risk.  Baseline: 23/30  25/30 (10/23) Goal status: PARTIALLY MET  3.  Pt will improve 5x sit<>stand to less than or equal to 14.5 sec to demonstrate improved functional strength and transfer efficiency.  Baseline: 16.7 seconds with BUE support  13.9 seconds with BUE support  Goal status: MET  4.  Pt will improve condition 4 of mCTSIB to at least 20 seconds to demo improved vestibular input for balance.  Baseline: 9.5 seconds   13 seconds Goal status: NOT MET       ASSESSMENT:  CLINICAL IMPRESSION:  Today's skilled session focused on assessing LTGs for anticipated D/C. Pet met LTGs #1 and #3 in regards to performing HEP consistently and improvement in 5x sit <> stand. Pt now able to perform sit <> stands from a lower surface without UE support (with wide BOS and does have intermittent BLE bracing at times). Pt improved FGA to a 25/30, but no quite to goal level. Pt improved and is now at a lower fall risk. Pt did  not meet LTG #4 - improved condition 4 of mCTSIB to 13 seconds, but not quite to goal level, indicating continued decr vestibular input for balance. Reviewed pt's HEP to continue to work on strength and balance. Pt also subjectively reports that his balance feels steadier. Pt in agreement to D/C.    OBJECTIVE IMPAIRMENTS: Abnormal gait, decreased activity tolerance, decreased balance, decreased mobility, decreased strength, dizziness, and impaired sensation.   ACTIVITY LIMITATIONS: carrying, bending, stairs, transfers, and locomotion level  PARTICIPATION LIMITATIONS: community activity  PERSONAL FACTORS: Age, Past/current experiences, Time since onset of injury/illness/exacerbation, and 3+ comorbidities: cerebellar stroke, HTN, HLD, CAD, PVD, left plantar fasciitis, hearing loss, former smoker, neuropathy   are also affecting patient's functional outcome.   REHAB POTENTIAL: Good  CLINICAL DECISION MAKING: Evolving/moderate complexity  EVALUATION COMPLEXITY: Moderate  PLAN:  PT FREQUENCY: 2x/week  PT DURATION: 8 weeks - due to delay in scheduling, just predict 4 weeks   PLANNED INTERVENTIONS: Therapeutic exercises, Therapeutic activity, Neuromuscular re-education, Balance training, Gait training, Patient/Family education, Self Care, Joint mobilization, Stair training, Vestibular training, DME instructions, and Re-evaluation  PLAN FOR NEXT SESSION: D/C  Drake Leach, PT, DPT 12/13/2022, 8:39 AM

## 2022-12-15 ENCOUNTER — Ambulatory Visit (INDEPENDENT_AMBULATORY_CARE_PROVIDER_SITE_OTHER): Payer: Medicare Other

## 2023-02-19 NOTE — Progress Notes (Deleted)
 NEUROLOGY FOLLOW UP OFFICE NOTE  ARNE SCHLENDER [id number]  Subjective:  Charles [name] is a 72 y.o. year old right-handed male with a medical history of cerebellar stroke, HTN, HLD, CAD, PVD, left plantar fasciitis, hearing loss, former smoker who we last saw on 11/03/22 for worsening ambulation.  To briefly review: 11/03/22: Patient noticed balance problems about a month ago. When he gets up he feels off balance for a few seconds. When he walks, he feels his stance is wider and that he may fall to the left while walking. He has not fallen. He denies any pain. He will sit down for a few minutes and may have some improve. He resets himself. He does not have clear spinning or dizziness. He endorses cramps in his calves, but denies numbness and tingling. He denies bowel or bladder changes.   He also noticed blurry vision once while watching TV. He covered one eye and this resolved the blurry vision. This lasted about a minute, then went away. He has only had this issue one time though. He has chronic ptosis of the left eyelid, but this is not new or changing. He denies difficulty chewing or swallowing.    Patient feels weak in the arms and legs. He thinks the arms are due to chronic rotator cuff issues. He thinks his legs are weak due to inactivity. He has never had neck or back surgery and does not have spine issues as far as he knows. He denies neck or back pain.  Patient was seen by PCP and sent to an ear doctor (ENT) whose notes are outside of our system. He was seen last week and told he had a whole in his right ear. He is scheduled for balance testing in about a month.   Right cerebellar infarct was noted on MRI brain from 04/20/2014 as remote. MRI brain on 09/23/22 showed no new infarct. Bilateral mastoid effusions were noted.   Patient saw a neurologist about 8 years ago for numbness, tingling, and cramps in legs. He was told he had neuropathy.   Patient does not take any supplements.    EtOH use: usually every couple of days will have 3-4 drinks (airplane bottles) Restricted diet: No Family history of neurologic disease: none  Most recent Assessment and Plan (11/03/22): JOBIE POPP is a 72 y.o. male who presents for evaluation of imbalance. He has a relevant medical history of cerebellar stroke, HTN, HLD, CAD, PVD, left plantar fasciitis, hearing loss, former smoker. His neurological examination is pertinent for external rotation weakness (likely secondary to known rotator cuff issues), diminished vibratory sensation in distal lower extremities, and wide based, unsteady gait. Available diagnostic data is significant for MRI brain showing old right cerebellar infarct (was chronic on MRI from 2016). HbA1c is 4.8, B12 is 359. Patient's symptoms of imbalance and impaired ambulation is likely multifactorial with contributions from prior right cerebellar stroke, right inner ear dysfunction, and likely peripheral neuropathy. His neuropathy is likely very chronic given his hammertoes and high arches. He has no known risk factors for neuropathy though. What has caused recent worsening is also not clear. He is being worked up by ENT with vestibular testing. It that is unremarkable, patient is agreeable to EMG to see if neuropathy is present and the driving force of recent worsening.   PLAN: -Blood work: MMA, B1, IFE -EMG discussed, patient would rather complete balance testing with ENT first, then reconsider EMG to look at severity and if PN is active  currently -Vitamin D  1000 international units (IU) daily  -Referral to Vestibular/balance therapy -Get ENT notes and evaluation from [facility], MD (ENT) [id number]  Since their last visit: B1 was low (< 6). I recommended B1 100 mg daily. ***  I received ENT note (in media) from 10/31/22. Per that clinic note by Dr. Karis:      I do not have the results of his vestibular testing.***  PT***  MEDICATIONS:  Outpatient  Encounter Medications as of 03/01/2023  Medication Sig   albuterol  (VENTOLIN  HFA) 108 (90 Base) MCG/ACT inhaler Inhale 2 puffs into the lungs every 6 (six) hours as needed for wheezing or shortness of breath.   amLODipine  (NORVASC ) 5 MG tablet Take 1 tablet (5 mg total) by mouth daily.   apixaban  (ELIQUIS ) 2.5 MG TABS tablet Take 1 tablet (2.5 mg total) by mouth 2 (two) times daily.   chlorproMAZINE  (THORAZINE ) 10 MG tablet Take 1 tablet (10 mg total) by mouth 3 (three) times daily as needed for up to 10 doses. To help with hiccups   ezetimibe  (ZETIA ) 10 MG tablet Take 1 tablet (10 mg total) by mouth daily.   fexofenadine  (ALLEGRA ) 180 MG tablet Take 1 tablet (180 mg total) by mouth daily. (Patient not taking: Reported on 11/03/2022)   guaiFENesin  (MUCINEX ) 600 MG 12 hr tablet Take 2 tablets (1,200 mg total) by mouth 2 (two) times daily as needed.   hydrOXYzine  (ATARAX /VISTARIL ) 10 MG tablet Take 1 tablet (10 mg total) by mouth 3 (three) times daily as needed for itching.   Multiple Vitamins-Minerals (MULTIVITAMIN WITH MINERALS) tablet Take 1 tablet by mouth daily. Men 50 + (Patient not taking: Reported on 11/03/2022)   ondansetron  (ZOFRAN ) 4 MG tablet Take 1 tablet (4 mg total) by mouth every 6 (six) hours.   pantoprazole  (PROTONIX ) 40 MG tablet Take 1 tablet (40 mg total) by mouth daily.   rosuvastatin  (CRESTOR ) 40 MG tablet Take 1 tablet (40 mg total) by mouth daily.   sucralfate  (CARAFATE ) 1 g tablet TAKE 1 TABLET BY MOUTH FOUR TIMES A DAY   tadalafil  (CIALIS ) 20 MG tablet TAKE 1 TABLET BY MOUTH EVERY DAY AS NEEDED FOR ERECTILE DYSFUNCTION   tamsulosin  (FLOMAX ) 0.4 MG CAPS capsule Take 1 capsule (0.4 mg total) by mouth daily. (Patient not taking: Reported on 11/03/2022)   temazepam  (RESTORIL ) 15 MG capsule TAKE 1 CAPSULE BY MOUTH AT BEDTIME AS NEEDED   tiZANidine  (ZANAFLEX ) 2 MG tablet TAKE 1 TABLET BY MOUTH TWICE DAILY AS NEEDED FOR MUSCLE SPASM   triamcinolone  (NASACORT ) 55 MCG/ACT AERO nasal  inhaler Place 2 sprays into the nose daily. (Patient not taking: Reported on 11/03/2022)   No facility-administered encounter medications on file as of 03/01/2023.    PAST MEDICAL HISTORY: Past Medical History:  Diagnosis Date   ABNORMAL ELECTROCARDIOGRAM 11/02/2008   ABSCESS, LUNG 10/23/2006   Anemia 05/26/2014   BACTERIAL PNEUMONIA 12/28/2009   BPH (benign prostatic hyperplasia) 11/22/2010   Cerebellar stroke (HCC) 05/26/2014   CHEST PAIN 12/24/2009   Coronary artery calcification seen on CT scan 06/01/2015   Cramp of limb 07/19/2007   DIVERTICULOSIS, COLON 03/07/2007   DVT (deep venous thrombosis) (HCC) 01/2014   LLE   Emphysema of lung (HCC)    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/23/2006   FLANK PAIN, LEFT 02/25/2010   FREQUENCY, URINARY 12/24/2009   HEMORRHOIDS 10/23/2006   HYPERLIPIDEMIA 10/23/2006   HYPERTENSION 10/23/2006   PE (pulmonary embolism) 02/2014   PLANTAR FASCIITIS, LEFT 11/02/2008  Pneumonia 12/2013 X 2   PSA, INCREASED 11/20/2007   PULMONARY NODULE 05/14/2008   Unspecified Peripheral Vascular Disease 10/23/2006    PAST SURGICAL HISTORY: Past Surgical History:  Procedure Laterality Date   BIOPSY  09/29/2019   Procedure: BIOPSY;  Surgeon: Albertus Gordy HERO, MD;  Location: [facility];  Service: Endoscopy;;   BIOPSY  12/25/2019   Procedure: BIOPSY;  Surgeon: Teressa Toribio SQUIBB, MD;  Location: [facility];  Service: Endoscopy;;   COLONOSCOPY     ESOPHAGOGASTRODUODENOSCOPY (EGD) WITH PROPOFOL  N/A 06/15/2017   Procedure: ESOPHAGOGASTRODUODENOSCOPY (EGD) WITH PROPOFOL ;  Surgeon: Albertus Gordy HERO, MD;  Location: [facility];  Service: Gastroenterology;  Laterality: N/A;   ESOPHAGOGASTRODUODENOSCOPY (EGD) WITH PROPOFOL  N/A 09/29/2019   Procedure: ESOPHAGOGASTRODUODENOSCOPY (EGD) WITH PROPOFOL ;  Surgeon: Albertus Gordy HERO, MD;  Location: [facility];  Service: Endoscopy;  Laterality: N/A;   ESOPHAGOGASTRODUODENOSCOPY (EGD) WITH PROPOFOL  N/A 12/25/2019   Procedure: ESOPHAGOGASTRODUODENOSCOPY (EGD) WITH PROPOFOL ;   Surgeon: Teressa Toribio SQUIBB, MD;  Location: [facility];  Service: Endoscopy;  Laterality: N/A;   EUS N/A 12/25/2019   Procedure: UPPER ENDOSCOPIC ULTRASOUND (EUS) RADIAL;  Surgeon: Teressa Toribio SQUIBB, MD;  Location: [facility];  Service: Endoscopy;  Laterality: N/A;   HEMORRHOID SURGERY  ?1990's   INGUINAL HERNIA REPAIR Bilateral ?2000's   IVC FILTER INSERTION N/A 10/03/2016   Procedure: IVC Filter Retrieveal;  Surgeon: Serene Gaile ORN, MD;  Location: [facility];  Service: Cardiovascular;  Laterality: N/A;   MYRINGOTOMY WITH TUBE PLACEMENT Right 06/26/2018   SHOULDER ARTHROSCOPY W/ ROTATOR CUFF REPAIR Right 11/2013   STAPLING OF BLEBS Right 07/31/2016   Procedure: BLEBECTOMY;  Surgeon: Kerrin Elspeth BROCKS, MD;  Location: [facility] OR;  Service: Thoracic;  Laterality: Right;   VENA CAVA FILTER PLACEMENT Right 08/04/2016   Procedure: INSERTION VENA-CAVA FILTER;  Surgeon: Kerrin Elspeth BROCKS, MD;  Location: [facility] OR;  Service: Thoracic;  Laterality: Right;   VIDEO ASSISTED THORACOSCOPY Right 07/31/2016   Procedure: VIDEO ASSISTED THORACOSCOPY;  Surgeon: Kerrin Elspeth BROCKS, MD;  Location: [facility] OR;  Service: Thoracic;  Laterality: Right;   VIDEO ASSISTED THORACOSCOPY Right 08/04/2016   Procedure: REDO VIDEO ASSISTED THORACOSCOPY- EVACUATION OF HEMOTHORAX;  Surgeon: Kerrin Elspeth BROCKS, MD;  Location: [facility] OR;  Service: Thoracic;  Laterality: Right;   VIDEO BRONCHOSCOPY WITH ENDOBRONCHIAL NAVIGATION N/A 03/11/2014   Procedure: VIDEO BRONCHOSCOPY WITH ENDOBRONCHIAL NAVIGATION;  Surgeon: Lamar GORMAN Chris, MD;  Location: [facility] OR;  Service: Thoracic;  Laterality: N/A;    ALLERGIES: Allergies  Allergen Reactions   Pylera [Bis Subcit-Metronid-Tetracyc]     Itching     FAMILY HISTORY: Family History  Problem Relation Age of Onset   Heart disease Mother    Heart disease Father    Kidney disease Sister        Renal transplant   Colon cancer Neg Hx    Rectal cancer Neg Hx    Esophageal cancer  Neg Hx    Stomach cancer Neg Hx     SOCIAL HISTORY: Social History   Tobacco Use   Smoking status: Former    Current packs/day: 1.00    Average packs/day: 1 pack/day for 15.0 years (15.0 ttl pk-yrs)    Types: Cigarettes   Smokeless tobacco: Never   Tobacco comments:    quit smoking cigarettes in the 1990's  Vaping Use   Vaping status: Never Used  Substance Use Topics   Alcohol use: Yes    Comment: occassionaly  2 drinks a week   Drug use: Yes  Types: Marijuana    Comment: daily use /last used sunday   Social History   Social History Narrative   Are you right handed or left handed? Right   Are you currently employed ?    What is your current occupation? retired   Do you live at home alone?   Who lives with you? wife   What type of home do you live in: 1 story or 2 story? two    Caffeine 1 cup of coffee and 1 soda      Objective:  Vital Signs:  There were no vitals taken for this visit.  ***  Labs and Imaging review: New results: 11/03/22: IFE: no M protein B1: <6 MMA wnl  Previously reviewed results:      Lab Results  Component Value Date    HGBA1C 4.8 03/27/2022      Recent Labs       Lab Results  Component Value Date    VITAMINB12 359 03/27/2022      Recent Labs[] Expand by Default       Lab Results  Component Value Date    TSH 3.45 03/27/2022      Lipid panel: Component     Latest Ref Rng 03/27/2022  Cholesterol     0 - 200 mg/dL 834   Triglycerides     0.0 - 149.0 mg/dL 851.9   HDL Cholesterol     >39.00 mg/dL 38.99   VLDL     0.0 - 40.0 mg/dL 70.3   LDL (calc)     0 - 99 mg/dL 74   Total CHOL/HDL Ratio 3   NonHDL 103.70    Vit D (03/27/22): 24.33   Imaging: MRI brain w/wo contrast (04/20/2014): FINDINGS: Diffuse prominence of the CSF containing spaces is compatible with generalized cerebral atrophy. Scattered and patchy T2/FLAIR hyperintensity within the periventricular deep white matter both cerebral hemispheres most  consistent with chronic small vessel ischemic disease, fairly mild. Remote infarct present within the right cerebellar hemisphere.   Previously identified hyperdense lesion within the anterior right frontal lobe demonstrates heterogeneous T2 and FLAIR signal intensity with blooming artifact on gradient echo sequence. There is a faint waist of enhancement within this lesion on postcontrast sequence, favored to reflect enhancement related to blood brain barrier breakdown. This lesion measures 11 x 12 x 13 mm. Finding is favored to reflect a small parenchymal contusion related to recent trauma.   No other focal intracranial lesions identified. Mild diffuse pachymeningeal enhancement noted, nonspecific, and may be related to prior intervention. No other evidence for CNS hypotension.   No acute intracranial infarct. Gray-white matter differentiation maintained.   No midline shift or mass effect. Ventricles normal in size without evidence of hydrocephalus. No extra-axial fluid collection.   Left frontal scalp hematoma again noted. No acute abnormality seen about the orbits.   Scattered mucoperiosteal thickening present within the ethmoidal air cells and left frontal sinus. Scattered fluid signal intensity present within the mastoid air cells bilaterally.   Craniocervical junction within normal limits. Scattered degenerative changes noted within the visualized upper cervical spine.   IMPRESSION: 1. 11 x 12 x 13 mm right frontal lobe lesion, which corresponds to previously seen hyperdensity on prior CT. This is favored to reflect a small parenchymal contusion. Short interval follow-up CT to ensure interval resolution recommended. 2. Left frontal scalp hematoma. 3. No other acute intracranial process. 4. Remote right cerebellar infarct. 5. Atrophy with mild chronic microvascular ischemic disease.   MRI brain wo  contrast (09/23/22): FINDINGS: Brain: Diffusion imaging does not show  any acute or subacute infarction or other cause of restricted diffusion. Mild chronic small-vessel ischemic change affects pons. There is an old cerebellar infarction in the midportion on the right. Cerebral hemispheres show age related volume loss with mild to moderate chronic small-vessel ischemic changes of the white matter. No large vessel territory stroke. No mass lesion, hydrocephalus or extra-axial collection. Small focus of hemosiderin deposition in the right frontal lobe consistent with some residual hemosiderin from a frontal lobe contusion sustained in 2016. No hydrocephalus or extra-axial collection.   Vascular: Flow   Skull and upper cervical spine: Negative   Sinuses/Orbits: Clear sinuses.  Orbits negative.   Other: Bilateral mastoid effusions which could be associated with hearing loss.   IMPRESSION: 1. No acute or reversible intracranial finding. Old cerebellar infarction on the right. Chronic small-vessel ischemic changes of the pons and cerebral hemispheric white matter. 2. Small focus of hemosiderin deposition in the right frontal lobe consistent with some residual hemosiderin from a frontal lobe contusion sustained in 2016. 3. Bilateral mastoid effusions which could be associated with hearing loss.   Echocardiogram (06/14/17): Study Conclusions  - Left ventricle: The cavity size was normal. Wall thickness was    increased in a pattern of mild LVH. Systolic function was normal.    The estimated ejection fraction was in the range of 55% to 60%.    Wall motion was normal; there were no regional wall motion    abnormalities.  - Aortic valve: Trileaflet; mildly thickened, mildly calcified    leaflets.  - Left atrium: The atrium was mildly dilated.   Assessment/Plan:  This is Georganna JAYSON Sprang, a 72 y.o. male with: ***   Plan: ***  Return to clinic in ***  Total time spent reviewing records, interview, history/exam, documentation, and coordination of care  on day of encounter:  *** min  Venetia Potters, MD

## 2023-03-01 ENCOUNTER — Ambulatory Visit: Payer: Medicare Other | Admitting: Neurology

## 2023-03-26 ENCOUNTER — Ambulatory Visit (INDEPENDENT_AMBULATORY_CARE_PROVIDER_SITE_OTHER): Payer: Medicare Other | Admitting: Internal Medicine

## 2023-03-26 ENCOUNTER — Ambulatory Visit: Admitting: Internal Medicine

## 2023-03-26 ENCOUNTER — Encounter: Payer: Self-pay | Admitting: Internal Medicine

## 2023-03-26 VITALS — BP 132/70 | HR 87 | Temp 97.8°F | Ht 76.0 in | Wt 180.0 lb

## 2023-03-26 DIAGNOSIS — K2990 Gastroduodenitis, unspecified, without bleeding: Secondary | ICD-10-CM

## 2023-03-26 DIAGNOSIS — F1010 Alcohol abuse, uncomplicated: Secondary | ICD-10-CM

## 2023-03-26 DIAGNOSIS — R972 Elevated prostate specific antigen [PSA]: Secondary | ICD-10-CM | POA: Diagnosis not present

## 2023-03-26 DIAGNOSIS — R739 Hyperglycemia, unspecified: Secondary | ICD-10-CM

## 2023-03-26 DIAGNOSIS — H9201 Otalgia, right ear: Secondary | ICD-10-CM

## 2023-03-26 DIAGNOSIS — H905 Unspecified sensorineural hearing loss: Secondary | ICD-10-CM | POA: Diagnosis not present

## 2023-03-26 DIAGNOSIS — K219 Gastro-esophageal reflux disease without esophagitis: Secondary | ICD-10-CM | POA: Insufficient documentation

## 2023-03-26 DIAGNOSIS — H7391 Unspecified disorder of tympanic membrane, right ear: Secondary | ICD-10-CM

## 2023-03-26 DIAGNOSIS — R270 Ataxia, unspecified: Secondary | ICD-10-CM

## 2023-03-26 DIAGNOSIS — E538 Deficiency of other specified B group vitamins: Secondary | ICD-10-CM | POA: Diagnosis not present

## 2023-03-26 DIAGNOSIS — E611 Iron deficiency: Secondary | ICD-10-CM

## 2023-03-26 DIAGNOSIS — E7800 Pure hypercholesterolemia, unspecified: Secondary | ICD-10-CM | POA: Diagnosis not present

## 2023-03-26 DIAGNOSIS — I10 Essential (primary) hypertension: Secondary | ICD-10-CM

## 2023-03-26 DIAGNOSIS — K2900 Acute gastritis without bleeding: Secondary | ICD-10-CM | POA: Diagnosis not present

## 2023-03-26 DIAGNOSIS — E559 Vitamin D deficiency, unspecified: Secondary | ICD-10-CM | POA: Diagnosis not present

## 2023-03-26 DIAGNOSIS — K2970 Gastritis, unspecified, without bleeding: Secondary | ICD-10-CM

## 2023-03-26 DIAGNOSIS — Z7901 Long term (current) use of anticoagulants: Secondary | ICD-10-CM

## 2023-03-26 LAB — BASIC METABOLIC PANEL
BUN: 20 mg/dL (ref 6–23)
CO2: 19 meq/L (ref 19–32)
Calcium: 7.9 mg/dL — ABNORMAL LOW (ref 8.4–10.5)
Chloride: 116 meq/L — ABNORMAL HIGH (ref 96–112)
Creatinine, Ser: 2.51 mg/dL — ABNORMAL HIGH (ref 0.40–1.50)
GFR: 25.26 mL/min — ABNORMAL LOW (ref >60.00)
Glucose, Bld: 73 mg/dL (ref 70–99)
Potassium: 4.4 meq/L (ref 3.5–5.1)
Sodium: 143 meq/L (ref 135–145)

## 2023-03-26 LAB — LIPID PANEL
Cholesterol: 144 mg/dL (ref 0–200)
HDL: 39.9 mg/dL (ref >39.00)
LDL Cholesterol: 83 mg/dL (ref 0–99)
NonHDL: 104.04
Total CHOL/HDL Ratio: 4
Triglycerides: 103 mg/dL (ref 0.0–149.0)
VLDL: 20.6 mg/dL (ref 0.0–40.0)

## 2023-03-26 LAB — URINALYSIS, ROUTINE W REFLEX MICROSCOPIC
Bilirubin Urine: NEGATIVE
Hgb urine dipstick: NEGATIVE
Ketones, ur: NEGATIVE
Leukocytes,Ua: NEGATIVE
Nitrite: NEGATIVE
RBC / HPF: NONE SEEN (ref 0–?)
Specific Gravity, Urine: 1.01 (ref 1.000–1.030)
Total Protein, Urine: NEGATIVE
Urine Glucose: NEGATIVE
Urobilinogen, UA: 0.2 (ref 0.0–1.0)
WBC, UA: NONE SEEN (ref 0–?)
pH: 6.5 (ref 5.0–8.0)

## 2023-03-26 LAB — HEPATIC FUNCTION PANEL
ALT: 8 U/L (ref 0–53)
AST: 12 U/L (ref 0–37)
Albumin: 3.6 g/dL (ref 3.5–5.2)
Alkaline Phosphatase: 30 U/L — ABNORMAL LOW (ref 39–117)
Bilirubin, Direct: 0.1 mg/dL (ref 0.0–0.3)
Total Bilirubin: 0.5 mg/dL (ref 0.2–1.2)
Total Protein: 6.2 g/dL (ref 6.0–8.3)

## 2023-03-26 LAB — IBC PANEL
Iron: 32 ug/dL — ABNORMAL LOW (ref 42–165)
Saturation Ratios: 11.1 % — ABNORMAL LOW (ref 20.0–50.0)
TIBC: 287 ug/dL (ref 250.0–450.0)
Transferrin: 205 mg/dL — ABNORMAL LOW (ref 212.0–360.0)

## 2023-03-26 LAB — CBC WITH DIFFERENTIAL/PLATELET
Basophils Absolute: 0 10*3/uL (ref 0.0–0.1)
Basophils Relative: 0.7 % (ref 0.0–3.0)
Eosinophils Absolute: 0 10*3/uL (ref 0.0–0.7)
Eosinophils Relative: 0.7 % (ref 0.0–5.0)
HCT: 28.7 % — ABNORMAL LOW (ref 39.0–52.0)
Hemoglobin: 9.2 g/dL — ABNORMAL LOW (ref 13.0–17.0)
Lymphocytes Relative: 27.4 % (ref 12.0–46.0)
Lymphs Abs: 1.4 10*3/uL (ref 0.7–4.0)
MCHC: 32 g/dL (ref 30.0–36.0)
MCV: 97.9 fL (ref 78.0–100.0)
Monocytes Absolute: 0.7 10*3/uL (ref 0.1–1.0)
Monocytes Relative: 13.2 % — ABNORMAL HIGH (ref 3.0–12.0)
Neutro Abs: 2.9 10*3/uL (ref 1.4–7.7)
Neutrophils Relative %: 58 % (ref 43.0–77.0)
Platelets: 216 10*3/uL (ref 150.0–400.0)
RBC: 2.94 Mil/uL — ABNORMAL LOW (ref 4.22–5.81)
RDW: 16.6 % — ABNORMAL HIGH (ref 11.5–15.5)
WBC: 5 10*3/uL (ref 4.0–10.5)

## 2023-03-26 LAB — VITAMIN D 25 HYDROXY (VIT D DEFICIENCY, FRACTURES): VITD: 48.02 ng/mL (ref 30.00–100.00)

## 2023-03-26 LAB — VITAMIN B12: Vitamin B-12: 1130 pg/mL — ABNORMAL HIGH (ref 211–911)

## 2023-03-26 LAB — TSH: TSH: 2.27 u[IU]/mL (ref 0.35–5.50)

## 2023-03-26 LAB — FERRITIN: Ferritin: 24.3 ng/mL (ref 22.0–322.0)

## 2023-03-26 LAB — LIPASE: Lipase: 408 U/L — ABNORMAL HIGH (ref 11.0–59.0)

## 2023-03-26 LAB — PSA: PSA: 6.23 ng/mL — ABNORMAL HIGH (ref 0.10–4.00)

## 2023-03-26 LAB — HEMOGLOBIN A1C: Hgb A1c MFr Bld: 4 % — ABNORMAL LOW (ref 4.6–6.5)

## 2023-03-26 MED ORDER — EZETIMIBE 10 MG PO TABS: 10.0000 mg | ORAL_TABLET | Freq: Every day | ORAL | 3 refills | Status: DC

## 2023-03-26 MED ORDER — AMLODIPINE BESYLATE 5 MG PO TABS: 5.0000 mg | ORAL_TABLET | Freq: Every day | ORAL | 3 refills | Status: DC

## 2023-03-26 MED ORDER — TAMSULOSIN HCL 0.4 MG PO CAPS: 0.4000 mg | ORAL_CAPSULE | Freq: Every day | ORAL | 3 refills | Status: DC

## 2023-03-26 MED ORDER — APIXABAN 2.5 MG PO TABS: 2.5000 mg | ORAL_TABLET | Freq: Two times a day (BID) | ORAL | 3 refills | Status: DC

## 2023-03-26 MED ORDER — SUCRALFATE 1 G PO TABS: 1.0000 g | ORAL_TABLET | Freq: Four times a day (QID) | ORAL | 0 refills | Status: DC

## 2023-03-26 MED ORDER — ROSUVASTATIN CALCIUM 40 MG PO TABS: 40.0000 mg | ORAL_TABLET | Freq: Every day | ORAL | 3 refills | Status: DC

## 2023-03-26 MED ORDER — TEMAZEPAM 15 MG PO CAPS: ORAL_CAPSULE | ORAL | 2 refills | Status: DC

## 2023-03-26 MED ORDER — ALBUTEROL SULFATE HFA 108 (90 BASE) MCG/ACT IN AERS: 2.0000 | INHALATION_SPRAY | Freq: Four times a day (QID) | RESPIRATORY_TRACT | 5 refills | Status: AC | PRN

## 2023-03-26 MED ORDER — PANTOPRAZOLE SODIUM 40 MG PO TBEC: 40.0000 mg | DELAYED_RELEASE_TABLET | Freq: Every day | ORAL | 3 refills | Status: DC

## 2023-03-26 NOTE — Progress Notes (Unsigned)
Patient ID: Charles [name], male   DOB: 08/21/1950, 73 y.o.   MRN: [id number]        Chief Complaint: follow up HTN, HLD and hyperglycemia ***       HPI:  Charles [name] is a 73 y.o. male here with c/o        Wt Readings from Last 3 Encounters:  03/26/23 180 lb (81.6 kg)  11/03/22 179 lb (81.2 kg)  09/21/22 182 lb (82.6 kg)   BP Readings from Last 3 Encounters:  03/26/23 132/70  12/13/22 (!) 156/75  12/11/22 (!) 179/87         Past Medical History:  Diagnosis Date   ABNORMAL ELECTROCARDIOGRAM 11/02/2008   ABSCESS, LUNG 10/23/2006   Anemia 05/26/2014   BACTERIAL PNEUMONIA 12/28/2009   BPH (benign prostatic hyperplasia) 11/22/2010   Cerebellar stroke (HCC) 05/26/2014   CHEST PAIN 12/24/2009   Coronary artery calcification seen on CT scan 06/01/2015   Cramp of limb 07/19/2007   DIVERTICULOSIS, COLON 03/07/2007   DVT (deep venous thrombosis) (HCC) 01/2014   LLE   Emphysema of lung (HCC)    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/23/2006   FLANK PAIN, LEFT 02/25/2010   FREQUENCY, URINARY 12/24/2009   HEMORRHOIDS 10/23/2006   HYPERLIPIDEMIA 10/23/2006   HYPERTENSION 10/23/2006   PE (pulmonary embolism) 02/2014   PLANTAR FASCIITIS, LEFT 11/02/2008   Pneumonia 12/2013 X 2   PSA, INCREASED 11/20/2007   PULMONARY NODULE 05/14/2008   Unspecified Peripheral Vascular Disease 10/23/2006   Past Surgical History:  Procedure Laterality Date   BIOPSY  09/29/2019   Procedure: BIOPSY;  Surgeon: Beverley Fiedler, MD;  Location: [facility];  Service: Endoscopy;;   BIOPSY  12/25/2019   Procedure: BIOPSY;  Surgeon: Rachael Fee, MD;  Location: [facility];  Service: Endoscopy;;   COLONOSCOPY     ESOPHAGOGASTRODUODENOSCOPY (EGD) WITH PROPOFOL N/A 06/15/2017   Procedure: ESOPHAGOGASTRODUODENOSCOPY (EGD) WITH PROPOFOL;  Surgeon: Beverley Fiedler, MD;  Location: [facility];  Service: Gastroenterology;  Laterality: N/A;   ESOPHAGOGASTRODUODENOSCOPY (EGD) WITH PROPOFOL N/A 09/29/2019   Procedure: ESOPHAGOGASTRODUODENOSCOPY (EGD)  WITH PROPOFOL;  Surgeon: Beverley Fiedler, MD;  Location: [facility];  Service: Endoscopy;  Laterality: N/A;   ESOPHAGOGASTRODUODENOSCOPY (EGD) WITH PROPOFOL N/A 12/25/2019   Procedure: ESOPHAGOGASTRODUODENOSCOPY (EGD) WITH PROPOFOL;  Surgeon: Rachael Fee, MD;  Location: [facility];  Service: Endoscopy;  Laterality: N/A;   EUS N/A 12/25/2019   Procedure: UPPER ENDOSCOPIC ULTRASOUND (EUS) RADIAL;  Surgeon: Rachael Fee, MD;  Location: [facility];  Service: Endoscopy;  Laterality: N/A;   HEMORRHOID SURGERY  ?1990's   INGUINAL HERNIA REPAIR Bilateral ?2000's   IVC FILTER INSERTION N/A 10/03/2016   Procedure: IVC Filter Retrieveal;  Surgeon: Nada Libman, MD;  Location: [facility];  Service: Cardiovascular;  Laterality: N/A;   MYRINGOTOMY WITH TUBE PLACEMENT Right 06/26/2018   SHOULDER ARTHROSCOPY W/ ROTATOR CUFF REPAIR Right 11/2013   STAPLING OF BLEBS Right 07/31/2016   Procedure: BLEBECTOMY;  Surgeon: Loreli Slot, MD;  Location: [facility] OR;  Service: Thoracic;  Laterality: Right;   VENA CAVA FILTER PLACEMENT Right 08/04/2016   Procedure: INSERTION VENA-CAVA FILTER;  Surgeon: Loreli Slot, MD;  Location: [facility] OR;  Service: Thoracic;  Laterality: Right;   VIDEO ASSISTED THORACOSCOPY Right 07/31/2016   Procedure: VIDEO ASSISTED THORACOSCOPY;  Surgeon: Loreli Slot, MD;  Location: [facility] OR;  Service: Thoracic;  Laterality: Right;   VIDEO ASSISTED THORACOSCOPY Right 08/04/2016   Procedure: REDO VIDEO ASSISTED THORACOSCOPY- EVACUATION OF HEMOTHORAX;  Surgeon: Loreli Slot, MD;  Location: [facility] OR;  Service: Thoracic;  Laterality: Right;   VIDEO BRONCHOSCOPY WITH ENDOBRONCHIAL NAVIGATION N/A 03/11/2014   Procedure: VIDEO BRONCHOSCOPY WITH ENDOBRONCHIAL NAVIGATION;  Surgeon: Leslye Peer, MD;  Location: [facility] OR;  Service: Thoracic;  Laterality: N/A;    reports that he has quit smoking. His smoking use included cigarettes. He has a 15 pack-year smoking  history. He has never used smokeless tobacco. He reports current alcohol use. He reports current drug use. Drug: Marijuana. family history includes Heart disease in his father and mother; Kidney disease in his sister. Allergies  Allergen Reactions   Pylera [Bis Subcit-Metronid-Tetracyc]     Itching    Current Outpatient Medications on File Prior to Visit  Medication Sig Dispense Refill   albuterol (VENTOLIN HFA) 108 (90 Base) MCG/ACT inhaler Inhale 2 puffs into the lungs every 6 (six) hours as needed for wheezing or shortness of breath. 8 g 5   amLODipine (NORVASC) 5 MG tablet Take 1 tablet (5 mg total) by mouth daily. 90 tablet 3   apixaban (ELIQUIS) 2.5 MG TABS tablet Take 1 tablet (2.5 mg total) by mouth 2 (two) times daily. 180 tablet 3   chlorproMAZINE (THORAZINE) 10 MG tablet Take 1 tablet (10 mg total) by mouth 3 (three) times daily as needed for up to 10 doses. To help with hiccups 10 tablet 0   ezetimibe (ZETIA) 10 MG tablet Take 1 tablet (10 mg total) by mouth daily. 90 tablet 3   fexofenadine (ALLEGRA) 180 MG tablet Take 1 tablet (180 mg total) by mouth daily. 30 tablet 2   guaiFENesin (MUCINEX) 600 MG 12 hr tablet Take 2 tablets (1,200 mg total) by mouth 2 (two) times daily as needed. 60 tablet 1   hydrOXYzine (ATARAX/VISTARIL) 10 MG tablet Take 1 tablet (10 mg total) by mouth 3 (three) times daily as needed for itching. 60 tablet 1   Multiple Vitamins-Minerals (MULTIVITAMIN WITH MINERALS) tablet Take 1 tablet by mouth daily. Men 50 +     ondansetron (ZOFRAN) 4 MG tablet Take 1 tablet (4 mg total) by mouth every 6 (six) hours. 12 tablet 0   pantoprazole (PROTONIX) 40 MG tablet Take 1 tablet (40 mg total) by mouth daily. 90 tablet 3   rosuvastatin (CRESTOR) 40 MG tablet Take 1 tablet (40 mg total) by mouth daily. 90 tablet 3   sucralfate (CARAFATE) 1 g tablet TAKE 1 TABLET BY MOUTH FOUR TIMES A DAY 120 tablet 0   tadalafil (CIALIS) 20 MG tablet TAKE 1 TABLET BY MOUTH EVERY DAY AS  NEEDED FOR ERECTILE DYSFUNCTION 6 tablet 8   tamsulosin (FLOMAX) 0.4 MG CAPS capsule Take 1 capsule (0.4 mg total) by mouth daily. 90 capsule 3   temazepam (RESTORIL) 15 MG capsule TAKE 1 CAPSULE BY MOUTH AT BEDTIME AS NEEDED 60 capsule 2   tiZANidine (ZANAFLEX) 2 MG tablet TAKE 1 TABLET BY MOUTH TWICE DAILY AS NEEDED FOR MUSCLE SPASM 60 tablet 2   triamcinolone (NASACORT) 55 MCG/ACT AERO nasal inhaler Place 2 sprays into the nose daily. 1 each 12   No current facility-administered medications on file prior to visit.        ROS:  All others reviewed and negative.  Objective        PE:  BP 132/70 (BP Location: Left Arm, Patient Position: Sitting, Cuff Size: Large)   Pulse 87   Temp 97.8 F (36.6 C) (Temporal)   Ht 6\' 4"  (1.93 m)   Wt  180 lb (81.6 kg)   SpO2 99%   BMI 21.91 kg/m                 Constitutional: Pt appears in NAD               HENT: Head: NCAT.                Right Ear: External ear normal.                 Left Ear: External ear normal.                Eyes: . Pupils are equal, round, and reactive to light. Conjunctivae and EOM are normal               Nose: without d/c or deformity               Neck: Neck supple. Gross normal ROM               Cardiovascular: Normal rate and regular rhythm.                 Pulmonary/Chest: Effort normal and breath sounds without rales or wheezing.                Abd:  Soft, NT, ND, + BS, no organomegaly               Neurological: Pt is alert. At baseline orientation, motor grossly intact               Skin: Skin is warm. No rashes, no other new lesions, LE edema - ***               Psychiatric: Pt behavior is normal without agitation   Micro: none  Cardiac tracings I have personally interpreted today:  none  Pertinent Radiological findings (summarize): none   Lab Results  Component Value Date   WBC 7.4 03/27/2022   HGB 12.4 (L) 03/27/2022   HCT 37.1 (L) 03/27/2022   PLT 157.0 03/27/2022   GLUCOSE 70 03/27/2022   CHOL  165 03/27/2022   TRIG 148.0 03/27/2022   HDL 61.00 03/27/2022   LDLDIRECT 103.0 07/07/2021   LDLCALC 74 03/27/2022   ALT 13 03/27/2022   AST 16 03/27/2022   NA 142 03/27/2022   K 3.9 03/27/2022   CL 109 03/27/2022   CREATININE 2.21 (H) 03/27/2022   BUN 31 (H) 03/27/2022   CO2 23 03/27/2022   TSH 3.45 03/27/2022   PSA 4.74 (H) 03/27/2022   INR 1.2 09/29/2019   HGBA1C 4.8 03/27/2022   MICROALBUR 1.5 08/25/2015   Assessment/Plan:  Charles [name] is a 73 y.o. Black or African American [2] male with  has a past medical history of ABNORMAL ELECTROCARDIOGRAM (11/02/2008), ABSCESS, LUNG (10/23/2006), Anemia (05/26/2014), BACTERIAL PNEUMONIA (12/28/2009), BPH (benign prostatic hyperplasia) (11/22/2010), Cerebellar stroke (HCC) (05/26/2014), CHEST PAIN (12/24/2009), Coronary artery calcification seen on CT scan (06/01/2015), Cramp of limb (07/19/2007), DIVERTICULOSIS, COLON (03/07/2007), DVT (deep venous thrombosis) (HCC) (01/2014), Emphysema of lung (HCC), ERECTILE DYSFUNCTION (10/23/2006), FLANK PAIN, LEFT (02/25/2010), FREQUENCY, URINARY (12/24/2009), HEMORRHOIDS (10/23/2006), HYPERLIPIDEMIA (10/23/2006), HYPERTENSION (10/23/2006), PE (pulmonary embolism) (02/2014), PLANTAR FASCIITIS, LEFT (11/02/2008), Pneumonia (12/2013 X 2), PSA, INCREASED (11/20/2007), PULMONARY NODULE (05/14/2008), and Unspecified Peripheral Vascular Disease (10/23/2006).  No problem-specific Assessment & Plan notes found for this encounter.  Followup: No follow-ups on file.  Oliver Barre, MD 03/26/2023 8:12 AM [facility]  Internal Medicine

## 2023-03-26 NOTE — Patient Instructions (Addendum)
Please have your Shingrix (shingles) shots done at your local pharmacy., and the Tdap tetanus shot as well  Please take all new medication as prescribed - the carafate for the stomach  Please continue all other medications as before, and refills have been done as requested.  Please have the pharmacy call with any other refills you may need.  Please continue your efforts at being more active, low cholesterol diet, and weight control.  You are otherwise up to date with prevention measures today.  Please keep your appointments with your specialists as you may have planned - urology in June 2025  You will be contacted regarding the referral for: ENT  Please go to the LAB at the blood drawing area for the tests to be done  You will be contacted by phone if any changes need to be made immediately.  Otherwise, you will receive a letter about your results with an explanation, but please check with MyChart first.  Please make an Appointment to return in 6 months, or sooner if needed

## 2023-03-28 ENCOUNTER — Encounter: Payer: Self-pay | Admitting: Internal Medicine

## 2023-03-28 ENCOUNTER — Other Ambulatory Visit: Payer: Self-pay | Admitting: Internal Medicine

## 2023-03-28 DIAGNOSIS — R748 Abnormal levels of other serum enzymes: Secondary | ICD-10-CM

## 2023-03-28 DIAGNOSIS — N1831 Chronic kidney disease, stage 3a: Secondary | ICD-10-CM

## 2023-03-28 DIAGNOSIS — R109 Unspecified abdominal pain: Secondary | ICD-10-CM

## 2023-03-28 DIAGNOSIS — R1010 Upper abdominal pain, unspecified: Secondary | ICD-10-CM

## 2023-03-28 DIAGNOSIS — D649 Anemia, unspecified: Secondary | ICD-10-CM

## 2023-03-28 DIAGNOSIS — R972 Elevated prostate specific antigen [PSA]: Secondary | ICD-10-CM

## 2023-03-31 ENCOUNTER — Encounter: Payer: Self-pay | Admitting: Internal Medicine

## 2023-03-31 NOTE — Assessment & Plan Note (Addendum)
 With epigastric tender - ok for repeat carafate  qid x 1 mo, refer GI

## 2023-03-31 NOTE — Assessment & Plan Note (Signed)
 Uncontrolled for restart protonix  40 qd

## 2023-03-31 NOTE — Assessment & Plan Note (Signed)
 Last vitamin D  Lab Results  Component Value Date   VD25OH 48.02 03/26/2023   Stable, cont oral replacement

## 2023-03-31 NOTE — Assessment & Plan Note (Signed)
 Lab Results  Component Value Date   LDLCALC 83 03/26/2023   Uncontrolled, encouraged compliance with zetia  10 every day, crestor  40 qd

## 2023-03-31 NOTE — Assessment & Plan Note (Addendum)
 Pt states none x 2 wks,  to f/u any worsening symptoms or concerns, also for lipase with labs

## 2023-03-31 NOTE — Assessment & Plan Note (Signed)
 No overt bleeding, cont eliquis , for cbc with labs

## 2023-03-31 NOTE — Assessment & Plan Note (Signed)
 Lab Results  Component Value Date   PSA 6.23 (H) 03/26/2023   PSA 4.74 (H) 03/27/2022   PSA 2.27 07/07/2021   Worsening psa, can't r/o malignancy, for urology referral

## 2023-03-31 NOTE — Assessment & Plan Note (Signed)
 Lab Results  Component Value Date   HGBA1C 4.0 (L) 03/26/2023   Stable, pt to continue current medical treatment  - diet, wt control

## 2023-03-31 NOTE — Assessment & Plan Note (Signed)
 Subjective today, for ENT referral on request

## 2023-03-31 NOTE — Assessment & Plan Note (Signed)
 BP Readings from Last 3 Encounters:  03/26/23 132/70  12/13/22 (!) 156/75  12/11/22 (!) 179/87   Stable, pt to continue medical treatment norvasc  5 qd

## 2023-04-04 ENCOUNTER — Ambulatory Visit (HOSPITAL_COMMUNITY): Payer: Medicare Other

## 2023-04-06 ENCOUNTER — Ambulatory Visit

## 2023-04-23 ENCOUNTER — Other Ambulatory Visit: Payer: Self-pay | Admitting: Internal Medicine

## 2023-04-23 ENCOUNTER — Other Ambulatory Visit: Payer: Self-pay

## 2023-05-15 ENCOUNTER — Ambulatory Visit (INDEPENDENT_AMBULATORY_CARE_PROVIDER_SITE_OTHER)

## 2023-05-15 VITALS — Ht 76.0 in | Wt 180.0 lb

## 2023-05-15 DIAGNOSIS — Z0000 Encounter for general adult medical examination without abnormal findings: Secondary | ICD-10-CM | POA: Diagnosis not present

## 2023-05-15 NOTE — Progress Notes (Signed)
 Subjective:   Charles [name] is a 73 y.o. who presents for a Medicare Wellness preventive visit.  Visit Complete: Virtual I connected with  Charles [name] on 05/15/23 by a audio enabled telemedicine application and verified that I am speaking with the correct person using two identifiers.  Patient Location: Home  Provider Location: Office/Clinic  I discussed the limitations of evaluation and management by telemedicine. The patient expressed understanding and agreed to proceed.  Vital Signs: Because this visit was a virtual/telehealth visit, some criteria may be missing or patient reported. Any vitals not documented were not able to be obtained and vitals that have been documented are patient reported.  VideoDeclined- This patient declined Librarian, academic. Therefore the visit was completed with audio only.  Persons Participating in Visit: Patient.  AWV Questionnaire: No: Patient Medicare AWV questionnaire was not completed prior to this visit.  Cardiac Risk Factors include: advanced age (>42men, >43 women);hypertension;dyslipidemia;male gender     Objective:    Today's Vitals   05/15/23 1122  Weight: 180 lb (81.6 kg)  Height: 6\' 4"  (1.93 m)   Body mass index is 21.91 kg/m.     05/15/2023   11:20 AM 11/14/2022    9:35 AM 11/03/2022    9:32 AM 11/25/2021   10:26 AM 03/04/2021    6:14 AM 12/25/2019    7:38 AM 09/28/2019    4:46 AM  Advanced Directives  Does Patient Have a Medical Advance Directive? Yes Yes Yes Yes No Yes   Type of Estate agent of [place];Living will Living will;Healthcare Power of Attorney Living will;Healthcare Power of [employer] Power of [place];Living will     Copy of Healthcare Power of Attorney in Chart? No - copy requested   No - copy requested     Would patient like information on creating a medical advance directive?     No - Patient declined  No - Patient declined    Current  Medications (verified) Outpatient Encounter Medications as of 05/15/2023  Medication Sig   albuterol (VENTOLIN HFA) 108 (90 Base) MCG/ACT inhaler Inhale 2 puffs into the lungs every 6 (six) hours as needed for wheezing or shortness of breath.   amLODipine (NORVASC) 5 MG tablet Take 1 tablet (5 mg total) by mouth daily.   apixaban (ELIQUIS) 2.5 MG TABS tablet Take 1 tablet (2.5 mg total) by mouth 2 (two) times daily.   chlorproMAZINE (THORAZINE) 10 MG tablet Take 1 tablet (10 mg total) by mouth 3 (three) times daily as needed for up to 10 doses. To help with hiccups   ezetimibe (ZETIA) 10 MG tablet TAKE 1 TABLET DAILY   fexofenadine (ALLEGRA) 180 MG tablet Take 1 tablet (180 mg total) by mouth daily.   guaiFENesin (MUCINEX) 600 MG 12 hr tablet Take 2 tablets (1,200 mg total) by mouth 2 (two) times daily as needed.   hydrOXYzine (ATARAX/VISTARIL) 10 MG tablet Take 1 tablet (10 mg total) by mouth 3 (three) times daily as needed for itching.   Multiple Vitamins-Minerals (MULTIVITAMIN WITH MINERALS) tablet Take 1 tablet by mouth daily. Men 50 +   ondansetron (ZOFRAN) 4 MG tablet Take 1 tablet (4 mg total) by mouth every 6 (six) hours.   pantoprazole (PROTONIX) 40 MG tablet Take 1 tablet (40 mg total) by mouth daily.   rosuvastatin (CRESTOR) 40 MG tablet Take 1 tablet (40 mg total) by mouth daily.   sucralfate (CARAFATE) 1 g tablet Take 1 tablet (1 g total) by  mouth 4 (four) times daily.   tadalafil (CIALIS) 20 MG tablet TAKE 1 TABLET BY MOUTH EVERY DAY AS NEEDED FOR ERECTILE DYSFUNCTION   tamsulosin (FLOMAX) 0.4 MG CAPS capsule Take 1 capsule (0.4 mg total) by mouth daily.   temazepam (RESTORIL) 15 MG capsule TAKE 1 CAPSULE BY MOUTH AT BEDTIME AS NEEDED   tiZANidine (ZANAFLEX) 2 MG tablet TAKE 1 TABLET BY MOUTH TWICE DAILY AS NEEDED FOR MUSCLE SPASM   triamcinolone (NASACORT) 55 MCG/ACT AERO nasal inhaler Place 2 sprays into the nose daily.   No facility-administered encounter medications on file as  of 05/15/2023.    Allergies (verified) Pylera [bis subcit-metronid-tetracyc]   History: Past Medical History:  Diagnosis Date   ABNORMAL ELECTROCARDIOGRAM 11/02/2008   ABSCESS, LUNG 10/23/2006   Anemia 05/26/2014   BACTERIAL PNEUMONIA 12/28/2009   BPH (benign prostatic hyperplasia) 11/22/2010   Cerebellar stroke (HCC) 05/26/2014   CHEST PAIN 12/24/2009   Coronary artery calcification seen on CT scan 06/01/2015   Cramp of limb 07/19/2007   DIVERTICULOSIS, COLON 03/07/2007   DVT (deep venous thrombosis) (HCC) 01/2014   LLE   Emphysema of lung (HCC)    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/23/2006   FLANK PAIN, LEFT 02/25/2010   FREQUENCY, URINARY 12/24/2009   HEMORRHOIDS 10/23/2006   HYPERLIPIDEMIA 10/23/2006   HYPERTENSION 10/23/2006   PE (pulmonary embolism) 02/2014   PLANTAR FASCIITIS, LEFT 11/02/2008   Pneumonia 12/2013 X 2   PSA, INCREASED 11/20/2007   PULMONARY NODULE 05/14/2008   Unspecified Peripheral Vascular Disease 10/23/2006   Past Surgical History:  Procedure Laterality Date   BIOPSY  09/29/2019   Procedure: BIOPSY;  Surgeon: Beverley Fiedler, MD;  Location: [facility];  Service: Endoscopy;;   BIOPSY  12/25/2019   Procedure: BIOPSY;  Surgeon: Rachael Fee, MD;  Location: [facility];  Service: Endoscopy;;   COLONOSCOPY     ESOPHAGOGASTRODUODENOSCOPY (EGD) WITH PROPOFOL N/A 06/15/2017   Procedure: ESOPHAGOGASTRODUODENOSCOPY (EGD) WITH PROPOFOL;  Surgeon: Beverley Fiedler, MD;  Location: [facility];  Service: Gastroenterology;  Laterality: N/A;   ESOPHAGOGASTRODUODENOSCOPY (EGD) WITH PROPOFOL N/A 09/29/2019   Procedure: ESOPHAGOGASTRODUODENOSCOPY (EGD) WITH PROPOFOL;  Surgeon: Beverley Fiedler, MD;  Location: [facility];  Service: Endoscopy;  Laterality: N/A;   ESOPHAGOGASTRODUODENOSCOPY (EGD) WITH PROPOFOL N/A 12/25/2019   Procedure: ESOPHAGOGASTRODUODENOSCOPY (EGD) WITH PROPOFOL;  Surgeon: Rachael Fee, MD;  Location: [facility];  Service: Endoscopy;  Laterality: N/A;   EUS N/A 12/25/2019    Procedure: UPPER ENDOSCOPIC ULTRASOUND (EUS) RADIAL;  Surgeon: Rachael Fee, MD;  Location: [facility];  Service: Endoscopy;  Laterality: N/A;   HEMORRHOID SURGERY  ?1990's   INGUINAL HERNIA REPAIR Bilateral ?2000's   IVC FILTER INSERTION N/A 10/03/2016   Procedure: IVC Filter Retrieveal;  Surgeon: Nada Libman, MD;  Location: [facility];  Service: Cardiovascular;  Laterality: N/A;   MYRINGOTOMY WITH TUBE PLACEMENT Right 06/26/2018   SHOULDER ARTHROSCOPY W/ ROTATOR CUFF REPAIR Right 11/2013   STAPLING OF BLEBS Right 07/31/2016   Procedure: BLEBECTOMY;  Surgeon: Loreli Slot, MD;  Location: [facility] OR;  Service: Thoracic;  Laterality: Right;   VENA CAVA FILTER PLACEMENT Right 08/04/2016   Procedure: INSERTION VENA-CAVA FILTER;  Surgeon: Loreli Slot, MD;  [facility];  Service: Thoracic;  Laterality: Right;   VIDEO ASSISTED THORACOSCOPY Right 07/31/2016   Procedure: VIDEO ASSISTED THORACOSCOPY;  Surgeon: Loreli Slot, MD;  Location: [facility] OR;  Service: Thoracic;  Laterality: Right;   VIDEO ASSISTED THORACOSCOPY Right 08/04/2016   Procedure: REDO  VIDEO ASSISTED THORACOSCOPY- EVACUATION OF HEMOTHORAX;  Surgeon: Loreli Slot, MD;  Location: [facility] OR;  Service: Thoracic;  Laterality: Right;   VIDEO BRONCHOSCOPY WITH ENDOBRONCHIAL NAVIGATION N/A 03/11/2014   Procedure: VIDEO BRONCHOSCOPY WITH ENDOBRONCHIAL NAVIGATION;  Surgeon: Leslye Peer, MD;  Location: [facility] OR;  Service: Thoracic;  Laterality: N/A;   Family History  Problem Relation Age of Onset   Heart disease Mother    Heart disease Father    Kidney disease Sister        Renal transplant   Colon cancer Neg Hx    Rectal cancer Neg Hx    Esophageal cancer Neg Hx    Stomach cancer Neg Hx    Social History   Socioeconomic History   Marital status: Married    Spouse name: Not on file   Number of children: 2   Years of education: Not on file   Highest education level: Not on file   Occupational History   Occupation: retired  Tobacco Use   Smoking status: Former    Current packs/day: 1.00    Average packs/day: 1 pack/day for 15.0 years (15.0 ttl pk-yrs)    Types: Cigarettes    Passive exposure: Past   Smokeless tobacco: Never   Tobacco comments:    "quit smoking cigarettes in the 1990's"  Vaping Use   Vaping status: Never Used  Substance and Sexual Activity   Alcohol use: Not Currently    Comment: occassionaly  2 drinks a week   Drug use: Yes    Types: Marijuana    Comment: daily use /last used sunday   Sexual activity: Yes  Other Topics Concern   Not on file  Social History Narrative   Are you right handed or left handed? Right   Are you currently employed ?    What is your current occupation? retired   Do you live at home alone?   Who lives with you? wife   What type of home do you live in: 1 story or 2 story? two    Caffeine 1 cup of coffee and 1 soda   Social Drivers of Corporate investment banker Strain: Low Risk  (05/15/2023)   Overall Financial Resource Strain (CARDIA)    Difficulty of Paying Living Expenses: Not hard at all  Food Insecurity: No Food Insecurity (05/15/2023)   Hunger Vital Sign    Worried About Running Out of Food in the Last Year: Never true    Ran Out of Food in the Last Year: Never true  Transportation Needs: No Transportation Needs (05/15/2023)   PRAPARE - Administrator, Civil Service (Medical): No    Lack of Transportation (Non-Medical): No  Physical Activity: Insufficiently Active (05/15/2023)   Exercise Vital Sign    Days of Exercise per Week: 5 days    Minutes of Exercise per Session: 20 min  Stress: No Stress Concern Present (05/15/2023)   [employer] of Occupational Health - Occupational Stress Questionnaire    Feeling of Stress : Not at all  Social Connections: Moderately Integrated (05/15/2023)   Social Connection and Isolation Panel [NHANES]    Frequency of Communication with Friends and  Family: More than three times a week    Frequency of Social Gatherings with Friends and Family: More than three times a week    Attends Religious Services: 1 to 4 times per year    Active Member of [employer] or Organizations: No    Attends Banker Meetings:  Never    Marital Status: Married    Tobacco Counseling Counseling given: Yes Tobacco comments: "quit smoking cigarettes in the 1990's"    Clinical Intake:  Pre-visit preparation completed: Yes  Pain : No/denies pain     BMI - recorded: 21.91 Nutritional Status: BMI of 19-24  Normal Nutritional Risks: None Diabetes: No  Lab Results  Component Value Date   HGBA1C 4.0 (L) 03/26/2023   HGBA1C 4.8 03/27/2022   HGBA1C 5.0 09/08/2020     How often do you need to have someone help you when you read instructions, pamphlets, or other written materials from your doctor or pharmacy?: 1 - Never  Interpreter Needed?: No  Information entered by :: Hassell Halim, CMA   Activities of Daily Living     05/15/2023   11:24 AM  In your present state of health, do you have any difficulty performing the following activities:  Hearing? 0  Vision? 0  Difficulty concentrating or making decisions? 0  Walking or climbing stairs? 0  Dressing or bathing? 0  Doing errands, shopping? 0  Preparing Food and eating ? N  Using the Toilet? N  In the past six months, have you accidently leaked urine? N  Do you have problems with loss of bowel control? N  Managing your Medications? N  Managing your Finances? N  Housekeeping or managing your Housekeeping? N    Patient Care Team: Corwin Levins, MD as PCP - General  Indicate any recent Medical Services you may have received from other than [facility] providers in the past year (date may be approximate).     Assessment:   This is a routine wellness examination for Charles [name].  Hearing/Vision screen Hearing Screening - Comments:: Wears hearing aids Vision Screening - Comments:: Wears rx  glasses - up to date with routine eye exams with [facility]   Goals Addressed               This Visit's Progress     Patient Stated (pt-stated)        Patient stated he wants to stay active.       Depression Screen     05/15/2023   11:26 AM 03/26/2023    7:59 AM 09/21/2022    8:22 AM 03/27/2022    8:19 AM 03/27/2022    8:11 AM 11/25/2021   10:25 AM 09/21/2021    8:20 AM  PHQ 2/9 Scores  PHQ - 2 Score 0 0 0 0 0 0 0  PHQ- 9 Score 0    0      Fall Risk     05/15/2023   11:27 AM 03/26/2023    7:58 AM 11/03/2022    9:32 AM 09/21/2022    8:22 AM 03/27/2022    8:12 AM  Fall Risk   Falls in the past year? 0 0 0 0 0  Number falls in past yr: 0 0 0 0 0  Injury with Fall? 0 0 0 0 0  Risk for fall due to : No Fall Risks No Fall Risks   No Fall Risks  Follow up Falls prevention discussed;Falls evaluation completed Falls evaluation completed Falls evaluation completed Falls evaluation completed Falls evaluation completed    MEDICARE RISK AT HOME:  Medicare Risk at Home Any stairs in or around the home?: Yes If so, are there any without handrails?: No Home free of loose throw rugs in walkways, pet beds, electrical cords, etc?: Yes Adequate lighting in your home to reduce risk of  falls?: Yes Life alert?: No Use of a cane, walker or w/c?: No Grab bars in the bathroom?: Yes Shower chair or bench in shower?: Yes Elevated toilet seat or a handicapped toilet?: No  TIMED UP AND GO:  Was the test performed?  No  Cognitive Function: 6CIT completed        05/15/2023   11:28 AM 11/25/2021   10:26 AM  6CIT Screen  What Year? 0 points 0 points  What month? 0 points 0 points  What time? 0 points 0 points  Count back from 20 0 points 0 points  Months in reverse 0 points 0 points  Repeat phrase 0 points 0 points  Total Score 0 points 0 points    Immunizations Immunization History  Administered Date(s) Administered   Moderna Sars-Covid-2 Vaccination 04/14/2019, 05/13/2019, 02/09/2020    PNEUMOCOCCAL CONJUGATE-20 09/08/2020   Td 02/20/1998, 11/02/2008    Screening Tests Health Maintenance  Topic Date Due   Zoster Vaccines- Shingrix (1 of 2) Never done   DTaP/Tdap/Td (3 - Tdap) 11/03/2018   INFLUENZA VACCINE  05/21/2023 (Originally 09/21/2022)   Medicare Annual Wellness (AWV)  05/14/2024   Colonoscopy  02/08/2027   Pneumonia Vaccine 28+ Years old  Completed   Hepatitis C Screening  Completed   HPV VACCINES  Aged Out   COVID-19 Vaccine  Discontinued    Health Maintenance  Health Maintenance Due  Topic Date Due   Zoster Vaccines- Shingrix (1 of 2) Never done   DTaP/Tdap/Td (3 - Tdap) 11/03/2018   Health Maintenance Items Addressed: 05/15/2023   Additional Screening:  Vision Screening: Recommended annual ophthalmology exams for early detection of glaucoma and other disorders of the eye. Pt stated he has annual eye exams with [facility].  Dental Screening: Recommended annual dental exams for proper oral hygiene  Community Resource Referral / Chronic Care Management: CRR required this visit?  No   CCM required this visit?  No     Plan:     I have personally reviewed and noted the following in the patient's chart:   Medical and social history Use of alcohol, tobacco or illicit drugs  Current medications and supplements including opioid prescriptions. Patient is not currently taking opioid prescriptions. Functional ability and status Nutritional status Physical activity Advanced directives List of other physicians Hospitalizations, surgeries, and ER visits in previous 12 months Vitals Screenings to include cognitive, depression, and falls Referrals and appointments  In addition, I have reviewed and discussed with patient certain preventive protocols, quality metrics, and best practice recommendations. A written personalized care plan for preventive services as well as general preventive health recommendations were provided to patient.      Darreld Mclean, CMA   05/15/2023   After Visit Summary: (MyChart) Due to this being a telephonic visit, the after visit summary with patients personalized plan was offered to patient via MyChart   Notes: Nothing significant to report at this time.

## 2023-05-15 NOTE — Patient Instructions (Signed)
 Charles [name] , Thank you for taking time to come for your Medicare Wellness Visit. I appreciate your ongoing commitment to your health goals. Please review the following plan we discussed and let me know if I can assist you in the future.   Referrals/Orders/Follow-Ups/Clinician Recommendations: Aim for 30 minutes of exercise or brisk walking, 6-8 glasses of water, and 5 servings of fruits and vegetables each day. Educated and advised on getting the Tdap vaccine in 2025.  This is a list of the screening recommended for you and due dates:  Health Maintenance  Topic Date Due   Zoster (Shingles) Vaccine (1 of 2) Never done   DTaP/Tdap/Td vaccine (3 - Tdap) 11/03/2018   Flu Shot  05/21/2023*   Medicare Annual Wellness Visit  05/14/2024   Colon Cancer Screening  02/08/2027   Pneumonia Vaccine  Completed   Hepatitis C Screening  Completed   HPV Vaccine  Aged Out   COVID-19 Vaccine  Discontinued  *Topic was postponed. The date shown is not the original due date.    Advanced directives: (Copy Requested) Please bring a copy of your health care power of attorney and living will to the office to be added to your chart at your convenience. You can mail to [facility] [address] [place], [place] [address] or email to [email or web address]  Next Medicare Annual Wellness Visit scheduled for next year: Yes

## 2023-06-13 NOTE — Progress Notes (Deleted)
 I saw Charles [name] in neurology clinic on 06/27/23 in follow up for worsening ambulation.  HPI: Charles [name] is a 73 y.o. year old male with a history of cerebellar stroke, HTN, HLD, CAD, PVD, left plantar fasciitis, hearing loss, former smoker who we last saw on 11/03/22.  To briefly review: 11/03/22: Patient noticed balance problems about a month ago. When he gets up he feels off balance for a few seconds. When he walks, he feels his stance is wider and that he may fall to the left while walking. He has not fallen. He denies any pain. He will sit down for a few minutes and may have some improve. He resets himself. He does not have clear spinning or dizziness. He endorses cramps in his calves, but denies numbness and tingling. He denies bowel or bladder changes.   He also noticed blurry vision once while watching TV. He covered one eye and this resolved the blurry vision. This lasted about a minute, then went away. He has only had this issue one time though. He has chronic ptosis of the left eyelid, but this is not new or changing. He denies difficulty chewing or swallowing.    Patient feels weak in the arms and legs. He thinks the arms are due to chronic rotator cuff issues. He thinks his legs are weak due to inactivity. He has never had neck or back surgery and does not have spine issues as far as he knows. He denies neck or back pain.  Patient was seen by PCP and sent to an "ear doctor (ENT)" whose notes are outside of our system. He was seen last week and told he had a whole in his right ear. He is scheduled for balance testing in about a month.   Right cerebellar infarct was noted on MRI brain from 04/20/2014 as remote. MRI brain on 09/23/22 showed no new infarct. Bilateral mastoid effusions were noted.   Patient saw a neurologist about 8 years ago for numbness, tingling, and cramps in legs. He was told he had neuropathy.   Patient does not take any supplements.   EtOH use: usually  every couple of days will have 3-4 drinks (airplane bottles) Restricted diet: No Family history of neurologic disease: none  Most recent Assessment and Plan (11/03/22): Charles [name] is a 73 y.o. male who presents for evaluation of imbalance. He has a relevant medical history of cerebellar stroke, HTN, HLD, CAD, PVD, left plantar fasciitis, hearing loss, former smoker. His neurological examination is pertinent for external rotation weakness (likely secondary to known rotator cuff issues), diminished vibratory sensation in distal lower extremities, and wide based, unsteady gait. Available diagnostic data is significant for MRI brain showing old right cerebellar infarct (was chronic on MRI from 2016). HbA1c is 4.8, B12 is 359. Patient's symptoms of imbalance and impaired ambulation is likely multifactorial with contributions from prior right cerebellar stroke, right inner ear dysfunction, and likely peripheral neuropathy. His neuropathy is likely very chronic given his hammertoes and high arches. He has no known risk factors for neuropathy though. What has caused recent worsening is also not clear. He is being worked up by ENT with vestibular testing. It that is unremarkable, patient is agreeable to EMG to see if neuropathy is present and the driving force of recent worsening.   PLAN: -Blood work: MMA, B1, IFE -EMG discussed, patient would rather complete balance testing with ENT first, then reconsider EMG to look at severity and if PN is active  currently -Vitamin D  1000 international units (IU) daily  -Referral to Vestibular/balance therapy -Get ENT notes and evaluation from [facility] [place], [place] (ENT) [address]  Since their last visit: B1 was low (< 6). I recommended B1 100 mg daily. ***  I received ENT note (in media) from 10/31/22. Per that clinic note by Dr. Darlin Ehrlich:     I do not have the results of his vestibular testing.***   PT***    ROS: Pertinent positive and negative  systems reviewed in HPI. ***   MEDICATIONS:  Outpatient Encounter Medications as of 06/27/2023  Medication Sig   albuterol  (VENTOLIN  HFA) 108 (90 Base) MCG/ACT inhaler Inhale 2 puffs into the lungs every 6 (six) hours as needed for wheezing or shortness of breath.   amLODipine  (NORVASC ) 5 MG tablet Take 1 tablet (5 mg total) by mouth daily.   apixaban  (ELIQUIS ) 2.5 MG TABS tablet Take 1 tablet (2.5 mg total) by mouth 2 (two) times daily.   chlorproMAZINE  (THORAZINE ) 10 MG tablet Take 1 tablet (10 mg total) by mouth 3 (three) times daily as needed for up to 10 doses. To help with hiccups   ezetimibe  (ZETIA ) 10 MG tablet TAKE 1 TABLET DAILY   fexofenadine  (ALLEGRA ) 180 MG tablet Take 1 tablet (180 mg total) by mouth daily.   guaiFENesin  (MUCINEX ) 600 MG 12 hr tablet Take 2 tablets (1,200 mg total) by mouth 2 (two) times daily as needed.   hydrOXYzine  (ATARAX /VISTARIL ) 10 MG tablet Take 1 tablet (10 mg total) by mouth 3 (three) times daily as needed for itching.   Multiple Vitamins-Minerals (MULTIVITAMIN WITH MINERALS) tablet Take 1 tablet by mouth daily. Men 50 +   ondansetron  (ZOFRAN ) 4 MG tablet Take 1 tablet (4 mg total) by mouth every 6 (six) hours.   pantoprazole  (PROTONIX ) 40 MG tablet Take 1 tablet (40 mg total) by mouth daily.   rosuvastatin  (CRESTOR ) 40 MG tablet Take 1 tablet (40 mg total) by mouth daily.   sucralfate  (CARAFATE ) 1 g tablet Take 1 tablet (1 g total) by mouth 4 (four) times daily.   tadalafil  (CIALIS ) 20 MG tablet TAKE 1 TABLET BY MOUTH EVERY DAY AS NEEDED FOR ERECTILE DYSFUNCTION   tamsulosin  (FLOMAX ) 0.4 MG CAPS capsule Take 1 capsule (0.4 mg total) by mouth daily.   temazepam  (RESTORIL ) 15 MG capsule TAKE 1 CAPSULE BY MOUTH AT BEDTIME AS NEEDED   tiZANidine  (ZANAFLEX ) 2 MG tablet TAKE 1 TABLET BY MOUTH TWICE DAILY AS NEEDED FOR MUSCLE SPASM   triamcinolone  (NASACORT ) 55 MCG/ACT AERO nasal inhaler Place 2 sprays into the nose daily.   No facility-administered encounter  medications on file as of 06/27/2023.    PAST MEDICAL HISTORY: Past Medical History:  Diagnosis Date   ABNORMAL ELECTROCARDIOGRAM 11/02/2008   ABSCESS, LUNG 10/23/2006   Anemia 05/26/2014   BACTERIAL PNEUMONIA 12/28/2009   BPH (benign prostatic hyperplasia) 11/22/2010   Cerebellar stroke (HCC) 05/26/2014   CHEST PAIN 12/24/2009   Coronary artery calcification seen on CT scan 06/01/2015   Cramp of limb 07/19/2007   DIVERTICULOSIS, COLON 03/07/2007   DVT (deep venous thrombosis) (HCC) 01/2014   LLE   Emphysema of lung (HCC)    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/23/2006   FLANK PAIN, LEFT 02/25/2010   FREQUENCY, URINARY 12/24/2009   HEMORRHOIDS 10/23/2006   HYPERLIPIDEMIA 10/23/2006   HYPERTENSION 10/23/2006   PE (pulmonary embolism) 02/2014   PLANTAR FASCIITIS, LEFT 11/02/2008   Pneumonia 12/2013 X 2   PSA, INCREASED 11/20/2007  PULMONARY NODULE 05/14/2008   Unspecified Peripheral Vascular Disease 10/23/2006    PAST SURGICAL HISTORY: Past Surgical History:  Procedure Laterality Date   BIOPSY  09/29/2019   Procedure: BIOPSY;  Surgeon: Nannette Babe, MD;  Location: [facility];  Service: Endoscopy;;   BIOPSY  12/25/2019   Procedure: BIOPSY;  Surgeon: Janel Medford, MD;  Location: [facility];  Service: Endoscopy;;   COLONOSCOPY     ESOPHAGOGASTRODUODENOSCOPY (EGD) WITH PROPOFOL  N/A 06/15/2017   Procedure: ESOPHAGOGASTRODUODENOSCOPY (EGD) WITH PROPOFOL ;  Surgeon: Nannette Babe, MD;  Location: [facility];  Service: Gastroenterology;  Laterality: N/A;   ESOPHAGOGASTRODUODENOSCOPY (EGD) WITH PROPOFOL  N/A 09/29/2019   Procedure: ESOPHAGOGASTRODUODENOSCOPY (EGD) WITH PROPOFOL ;  Surgeon: Nannette Babe, MD;  Location: [facility];  Service: Endoscopy;  Laterality: N/A;   ESOPHAGOGASTRODUODENOSCOPY (EGD) WITH PROPOFOL  N/A 12/25/2019   Procedure: ESOPHAGOGASTRODUODENOSCOPY (EGD) WITH PROPOFOL ;  Surgeon: Janel Medford, MD;  Location: [facility];  Service: Endoscopy;  Laterality: N/A;   EUS N/A 12/25/2019    Procedure: UPPER ENDOSCOPIC ULTRASOUND (EUS) RADIAL;  Surgeon: Janel Medford, MD;  Location: [facility];  Service: Endoscopy;  Laterality: N/A;   HEMORRHOID SURGERY  ?1990's   INGUINAL HERNIA REPAIR Bilateral ?2000's   IVC FILTER INSERTION N/A 10/03/2016   Procedure: IVC Filter Retrieveal;  Surgeon: Margherita Shell, MD;  Location: [facility];  Service: Cardiovascular;  Laterality: N/A;   MYRINGOTOMY WITH TUBE PLACEMENT Right 06/26/2018   SHOULDER ARTHROSCOPY W/ ROTATOR CUFF REPAIR Right 11/2013   STAPLING OF BLEBS Right 07/31/2016   Procedure: BLEBECTOMY;  Surgeon: Zelphia Higashi, MD;  Location: [facility] OR;  Service: Thoracic;  Laterality: Right;   VENA CAVA FILTER PLACEMENT Right 08/04/2016   Procedure: INSERTION VENA-CAVA FILTER;  Surgeon: Zelphia Higashi, MD;  Location: [facility] OR;  Service: Thoracic;  Laterality: Right;   VIDEO ASSISTED THORACOSCOPY Right 07/31/2016   Procedure: VIDEO ASSISTED THORACOSCOPY;  Surgeon: Zelphia Higashi, MD;  Location: [facility] OR;  Service: Thoracic;  Laterality: Right;   VIDEO ASSISTED THORACOSCOPY Right 08/04/2016   Procedure: REDO VIDEO ASSISTED THORACOSCOPY- EVACUATION OF HEMOTHORAX;  Surgeon: Zelphia Higashi, MD;  Location: [facility] OR;  Service: Thoracic;  Laterality: Right;   VIDEO BRONCHOSCOPY WITH ENDOBRONCHIAL NAVIGATION N/A 03/11/2014   Procedure: VIDEO BRONCHOSCOPY WITH ENDOBRONCHIAL NAVIGATION;  Surgeon: Denson Flake, MD;  Location: [facility] OR;  Service: Thoracic;  Laterality: N/A;    ALLERGIES: Allergies  Allergen Reactions   Pylera [Bis Subcit-Metronid-Tetracyc]     Itching     FAMILY HISTORY: Family History  Problem Relation Age of Onset   Heart disease Mother    Heart disease Father    Kidney disease Sister        Renal transplant   Colon cancer Neg Hx    Rectal cancer Neg Hx    Esophageal cancer Neg Hx    Stomach cancer Neg Hx     SOCIAL HISTORY: Social History   Tobacco Use   Smoking status: Former     Current packs/day: 1.00    Average packs/day: 1 pack/day for 15.0 years (15.0 ttl pk-yrs)    Types: Cigarettes    Passive exposure: Past   Smokeless tobacco: Never   Tobacco comments:    "quit smoking cigarettes in the 1990's"  Vaping Use   Vaping status: Never Used  Substance Use Topics   Alcohol use: Not Currently    Comment: occassionaly  2 drinks a week   Drug use: Yes    Types: Marijuana  Comment: daily use /last used sunday   Social History   Social History Narrative   Are you right handed or left handed? Right   Are you currently employed ?    What is your current occupation? retired   Do you live at home alone?   Who lives with you? wife   What type of home do you live in: 1 story or 2 story? two    Caffeine 1 cup of coffee and 1 soda    Objective:  Vital Signs:  There were no vitals taken for this visit.  General:*** General appearance: Awake and alert. No distress. Cooperative with exam.  Skin: No obvious rash or jaundice. HEENT: Atraumatic. Anicteric. Lungs: Non-labored breathing on room air  Heart: Regular Abdomen: Soft, non tender. Extremities: No edema. No obvious deformity.  Musculoskeletal: No obvious joint swelling.  Neurological: Mental Status: Alert. Speech fluent. No pseudobulbar affect Cranial Nerves: CNII: No RAPD. Visual fields intact. CNIII, IV, VI: PERRL. No nystagmus. EOMI. CN V: Facial sensation intact bilaterally to fine touch. [place] clench strong. Jaw jerk***. CN VII: Facial muscles symmetric and strong. No ptosis at rest or after sustained upgaze***. CN VIII: Hears finger rub well bilaterally. CN IX: No hypophonia. CN X: Palate elevates symmetrically. CN XI: Full strength shoulder shrug bilaterally. CN XII: Tongue protrusion full and midline. No atrophy or fasciculations. No significant dysarthria*** Motor: Tone is ***. *** fasciculations in *** extremities. *** atrophy. No grip or percussive myotonia.  Individual muscle  group testing (MRC grade out of 5):  Movement     Neck flexion ***    Neck extension ***     Right Left   Shoulder abduction *** ***   Shoulder adduction *** ***   Shoulder ext rotation *** ***   Shoulder int rotation *** ***   Elbow flexion *** ***   Elbow extension *** ***   Wrist extension *** ***   Wrist flexion *** ***   Finger abduction - FDI *** ***   Finger abduction - ADM *** ***   Finger extension *** ***   Finger distal flexion - 2/3 *** ***   Finger distal flexion - 4/5 *** ***   Thumb flexion - FPL *** ***   Thumb abduction - APB *** ***    Hip flexion *** ***   Hip extension *** ***   Hip adduction *** ***   Hip abduction *** ***   Knee extension *** ***   Knee flexion *** ***   Dorsiflexion *** ***   Plantarflexion *** ***   Inversion *** ***   Eversion *** ***   Great toe extension *** ***   Great toe flexion *** ***     Reflexes:  Right Left  Bicep *** ***  Tricep *** ***  BrRad *** ***  Knee *** ***  Ankle *** ***   Pathological Reflexes: Babinski: *** response bilaterally*** Hoffman: *** Troemner: *** Pectoral: *** Palmomental: *** Facial: *** Midline tap: *** Sensation: Pinprick: *** Vibration: *** Temperature: *** Proprioception: *** Coordination: Intact finger-to- nose-finger and heel-to-shin bilaterally. Romberg negative.*** Gait: Able to rise from chair with arms crossed unassisted. Normal, narrow-based gait. Able to tandem walk. Able to walk on toes and heels.***   Labs and Imaging review: New results: 03/26/23: Lipid panel: tChol 144, LDL 83, TG 103 TSH wnl B12: 1130 Vit D wnl HbA1c: 4.0  11/03/22: IFE: no M protein B1: <6 MMA wnl   Previously reviewed results:  Lab Results  Component Value Date    HGBA1C 4.8 03/27/2022      Recent Labs           Lab Results  Component Value Date    VITAMINB12 359 03/27/2022      Recent Labs[] Expand by Default           Lab Results  Component Value Date    TSH  3.45 03/27/2022      Lipid panel: Component     Latest Ref Rng 03/27/2022  Cholesterol     0 - 200 mg/dL 161   Triglycerides     0.0 - 149.0 mg/dL 096.0   HDL Cholesterol     >39.00 mg/dL 45.40   VLDL     0.0 - 40.0 mg/dL 98.1   LDL (calc)     0 - 99 mg/dL 74   Total CHOL/HDL Ratio 3   NonHDL 103.70    Vit D (03/27/22): 24.33   Imaging: MRI brain w/wo contrast (04/20/2014): FINDINGS: Diffuse prominence of the CSF containing spaces is compatible with generalized cerebral atrophy. Scattered and patchy T2/FLAIR hyperintensity within the periventricular deep white matter both cerebral hemispheres most consistent with chronic small vessel ischemic disease, fairly mild. Remote infarct present within the right cerebellar hemisphere.   Previously identified hyperdense lesion within the anterior right frontal lobe demonstrates heterogeneous T2 and FLAIR signal intensity with blooming artifact on gradient echo sequence. There is a faint waist of enhancement within this lesion on postcontrast sequence, favored to reflect enhancement related to blood brain barrier breakdown. This lesion measures 11 x 12 x 13 mm. Finding is favored to reflect a small parenchymal contusion related to recent trauma.   No other focal intracranial lesions identified. Mild diffuse pachymeningeal enhancement noted, nonspecific, and may be related to prior intervention. No other evidence for CNS hypotension.   No acute intracranial infarct. Gray-white matter differentiation maintained.   No midline shift or mass effect. Ventricles normal in size without evidence of hydrocephalus. No extra-axial fluid collection.   Left frontal scalp hematoma again noted. No acute abnormality seen about the orbits.   Scattered mucoperiosteal thickening present within the ethmoidal air cells and left frontal sinus. Scattered fluid signal intensity present within the mastoid air cells bilaterally.   Craniocervical  junction within normal limits. Scattered degenerative changes noted within the visualized upper cervical spine.   IMPRESSION: 1. 11 x 12 x 13 mm right frontal lobe lesion, which corresponds to previously seen hyperdensity on prior CT. This is favored to reflect a small parenchymal contusion. Short interval follow-up CT to ensure interval resolution recommended. 2. Left frontal scalp hematoma. 3. No other acute intracranial process. 4. Remote right cerebellar infarct. 5. Atrophy with mild chronic microvascular ischemic disease.   MRI brain wo contrast (09/23/22): FINDINGS: Brain: Diffusion imaging does not show any acute or subacute infarction or other cause of restricted diffusion. Mild chronic small-vessel ischemic change affects pons. There is an old cerebellar infarction in the midportion on the right. Cerebral hemispheres show age related volume loss with mild to moderate chronic small-vessel ischemic changes of the white matter. No large vessel territory stroke. No mass lesion, hydrocephalus or extra-axial collection. Small focus of hemosiderin deposition in the right frontal lobe consistent with some residual hemosiderin from a frontal lobe contusion sustained in 2016. No hydrocephalus or extra-axial collection.   Vascular: Flow   Skull and upper cervical spine: Negative   Sinuses/Orbits: Clear sinuses.  Orbits negative.   Other: Bilateral mastoid effusions  which could be associated with hearing loss.   IMPRESSION: 1. No acute or reversible intracranial finding. Old cerebellar infarction on the right. Chronic small-vessel ischemic changes of the pons and cerebral hemispheric white matter. 2. Small focus of hemosiderin deposition in the right frontal lobe consistent with some residual hemosiderin from a frontal lobe contusion sustained in 2016. 3. Bilateral mastoid effusions which could be associated with hearing loss.   Echocardiogram (06/14/17): Study Conclusions   - Left ventricle: The cavity size was normal. Wall thickness was    increased in a pattern of mild LVH. Systolic function was normal.    The estimated ejection fraction was in the range of 55% to 60%.    Wall motion was normal; there were no regional wall motion    abnormalities.  - Aortic valve: Trileaflet; mildly thickened, mildly calcified    leaflets.  - Left atrium: The atrium was mildly dilated.   ASSESSMENT: This is Charles [name], a 73 y.o. male with:  ***  Plan: ***  Return to clinic in ***  Total time spent reviewing records, interview, history/exam, documentation, and coordination of care on day of encounter:  *** min  Rommie Coats, MD

## 2023-06-21 ENCOUNTER — Other Ambulatory Visit: Payer: Self-pay | Admitting: Nephrology

## 2023-06-21 DIAGNOSIS — N184 Chronic kidney disease, stage 4 (severe): Secondary | ICD-10-CM

## 2023-06-24 ENCOUNTER — Other Ambulatory Visit: Payer: Self-pay | Admitting: Internal Medicine

## 2023-06-25 ENCOUNTER — Ambulatory Visit
Admission: RE | Admit: 2023-06-25 | Discharge: 2023-06-25 | Disposition: A | Discharge status: Home / Self Care | Source: Ambulatory Visit | Attending: Nephrology | Admitting: Nephrology

## 2023-06-25 DIAGNOSIS — N184 Chronic kidney disease, stage 4 (severe): Secondary | ICD-10-CM

## 2023-06-27 ENCOUNTER — Ambulatory Visit: Payer: Medicare Other | Admitting: Neurology

## 2023-08-10 ENCOUNTER — Other Ambulatory Visit: Payer: Self-pay | Admitting: Internal Medicine

## 2023-08-10 MED ORDER — TEMAZEPAM 15 MG PO CAPS: ORAL_CAPSULE | ORAL | 2 refills | Status: DC

## 2023-08-14 ENCOUNTER — Ambulatory Visit (INDEPENDENT_AMBULATORY_CARE_PROVIDER_SITE_OTHER): Admitting: Internal Medicine

## 2023-08-14 ENCOUNTER — Encounter: Payer: Self-pay | Admitting: Internal Medicine

## 2023-08-14 VITALS — BP 130/76 | HR 74 | Temp 98.5°F | Ht 76.0 in | Wt 175.0 lb

## 2023-08-14 DIAGNOSIS — N184 Chronic kidney disease, stage 4 (severe): Secondary | ICD-10-CM | POA: Diagnosis not present

## 2023-08-14 DIAGNOSIS — K219 Gastro-esophageal reflux disease without esophagitis: Secondary | ICD-10-CM | POA: Diagnosis not present

## 2023-08-14 DIAGNOSIS — I10 Essential (primary) hypertension: Secondary | ICD-10-CM

## 2023-08-14 DIAGNOSIS — E559 Vitamin D deficiency, unspecified: Secondary | ICD-10-CM

## 2023-08-14 DIAGNOSIS — D509 Iron deficiency anemia, unspecified: Secondary | ICD-10-CM

## 2023-08-14 DIAGNOSIS — F1010 Alcohol abuse, uncomplicated: Secondary | ICD-10-CM

## 2023-08-14 DIAGNOSIS — R739 Hyperglycemia, unspecified: Secondary | ICD-10-CM

## 2023-08-14 MED ORDER — PANTOPRAZOLE SODIUM 40 MG PO TBEC: 40.0000 mg | DELAYED_RELEASE_TABLET | Freq: Every day | ORAL | 3 refills | Status: DC

## 2023-08-14 NOTE — Progress Notes (Unsigned)
 Patient ID: Charles [name], male   DOB: [birth year], 73 y.o.   MRN: [id number]        Chief Complaint: follow up HTN, HLD and hyperglycemia ***       HPI:  Charles [name] is a 73 y.o. male here with c/o         Saw Renal DR Marlee about 2 mo ago with ckd4.   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   CHEST PAIN 12/24/2009   Coronary artery calcification seen on CT scan 06/01/2015   Cramp of limb 07/19/2007   DIVERTICULOSIS, COLON 03/07/2007   DVT (deep venous thrombosis) (HCC) 01/2014   LLE   Emphysema of lung (HCC)    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/23/2006   FLANK PAIN, LEFT 02/25/2010   FREQUENCY, URINARY 12/24/2009   HEMORRHOIDS 10/23/2006   HYPERLIPIDEMIA 10/23/2006   HYPERTENSION 10/23/2006   PE (pulmonary embolism) 02/2014   PLANTAR FASCIITIS, LEFT 11/02/2008   Pneumonia 12/2013 X 2   PSA, INCREASED 11/20/2007   PULMONARY NODULE 05/14/2008   Unspecified Peripheral Vascular Disease 10/23/2006   Past Surgical History:  Procedure Laterality Date   BIOPSY  09/29/2019   Procedure: BIOPSY;  Surgeon: Albertus Gordy HERO, MD;  Location: [facility];  Service: Endoscopy;;   BIOPSY  12/25/2019   Procedure: BIOPSY;  Surgeon: Teressa Toribio SQUIBB, MD;  [facility]: [facility];  Service: Endoscopy;;   COLONOSCOPY     ESOPHAGOGASTRODUODENOSCOPY (EGD) WITH PROPOFOL  N/A 06/15/2017   Procedure: ESOPHAGOGASTRODUODENOSCOPY (EGD) WITH PROPOFOL ;  Surgeon: Albertus Gordy HERO, MD;  [facility];  Service: Gastroenterology;  Laterality: N/A;   ESOPHAGOGASTRODUODENOSCOPY (EGD) WITH PROPOFOL  N/A 09/29/2019    Procedure: ESOPHAGOGASTRODUODENOSCOPY (EGD) WITH PROPOFOL ;  Surgeon: Albertus Gordy HERO, MD;  [facility];  Service: Endoscopy;  Laterality: N/A;   ESOPHAGOGASTRODUODENOSCOPY (EGD) WITH PROPOFOL  N/A 12/25/2019   Procedure: ESOPHAGOGASTRODUODENOSCOPY (EGD) WITH PROPOFOL ;  Surgeon: Teressa Toribio SQUIBB, MD;  [facility];  Service: Endoscopy;  Laterality: N/A;   EUS N/A 12/25/2019   Procedure: UPPER ENDOSCOPIC ULTRASOUND (EUS) RADIAL;  Surgeon: Teressa Toribio SQUIBB, MD;  [facility];  Service: Endoscopy;  Laterality: N/A;   HEMORRHOID SURGERY  ?1990's   INGUINAL HERNIA REPAIR Bilateral ?2000's   IVC FILTER INSERTION N/A 10/03/2016   Procedure: IVC Filter Retrieveal;  Surgeon: Serene Gaile ORN, MD;  [facility];  Service: Cardiovascular;  Laterality: N/A;   MYRINGOTOMY WITH TUBE PLACEMENT Right 06/26/2018   SHOULDER ARTHROSCOPY W/ ROTATOR CUFF REPAIR Right 11/2013   STAPLING OF BLEBS Right 07/31/2016   Procedure: BLEBECTOMY;  Surgeon: Kerrin Elspeth JAYSON, MD;  Location: [facility] OR;  Service: Thoracic;  Laterality: Right;   VENA CAVA FILTER PLACEMENT Right 08/04/2016   Procedure: INSERTION VENA-CAVA FILTER;  Surgeon: Kerrin Elspeth JAYSON, MD;  Location: [facility] OR;  Service: Thoracic;  Laterality: Right;   VIDEO ASSISTED THORACOSCOPY Right 07/31/2016   Procedure: VIDEO ASSISTED THORACOSCOPY;  Surgeon: Kerrin Elspeth JAYSON, MD;  Location: [facility] OR;  Service: Thoracic;  Laterality: Right;   VIDEO ASSISTED THORACOSCOPY  Right 08/04/2016   Procedure: REDO VIDEO ASSISTED THORACOSCOPY- EVACUATION OF HEMOTHORAX;  Surgeon: Kerrin Elspeth BROCKS, MD;  Location: [facility] OR;  Service: Thoracic;  Laterality: Right;   VIDEO BRONCHOSCOPY WITH ENDOBRONCHIAL NAVIGATION N/A 03/11/2014   Procedure: VIDEO BRONCHOSCOPY WITH ENDOBRONCHIAL NAVIGATION;  Surgeon: Lamar GORMAN Chris, MD;  [facility];  Service: Thoracic;  Laterality: N/A;    reports that he has quit smoking. His smoking use included  cigarettes. He has a 15 pack-year smoking history. He has been exposed to tobacco smoke. He has never used smokeless tobacco. He reports that he does not currently use alcohol. He reports current drug use. Drug: Marijuana. family history includes Heart disease in his father and mother; Kidney disease in his sister. Allergies  Allergen Reactions   Pylera [Bis Subcit-Metronid-Tetracyc]     Itching    Current Outpatient Medications on File Prior to Visit  Medication Sig Dispense Refill   albuterol  (VENTOLIN  HFA) 108 (90 Base) MCG/ACT inhaler Inhale 2 puffs into the lungs every 6 (six) hours as needed for wheezing or shortness of breath. 8 g 5   amLODipine  (NORVASC ) 5 MG tablet Take 1 tablet (5 mg total) by mouth daily. 90 tablet 3   chlorproMAZINE  (THORAZINE ) 10 MG tablet Take 1 tablet (10 mg total) by mouth 3 (three) times daily as needed for up to 10 doses. To help with hiccups 10 tablet 0   ELIQUIS  2.5 MG TABS tablet TAKE 1 TABLET TWICE A DAY 180 tablet 3   ezetimibe  (ZETIA ) 10 MG tablet TAKE 1 TABLET DAILY 90 tablet 3   fexofenadine  (ALLEGRA ) 180 MG tablet Take 1 tablet (180 mg total) by mouth daily. 30 tablet 2   guaiFENesin  (MUCINEX ) 600 MG 12 hr tablet Take 2 tablets (1,200 mg total) by mouth 2 (two) times daily as needed. 60 tablet 1   hydrOXYzine  (ATARAX /VISTARIL ) 10 MG tablet Take 1 tablet (10 mg total) by mouth 3 (three) times daily as needed for itching. 60 tablet 1   Multiple Vitamins-Minerals (MULTIVITAMIN WITH MINERALS) tablet Take 1 tablet by mouth daily. Men 50 +     ondansetron  (ZOFRAN ) 4 MG tablet Take 1 tablet (4 mg total) by mouth every 6 (six) hours. 12 tablet 0   pantoprazole  (PROTONIX ) 40 MG tablet Take 1 tablet (40 mg total) by mouth daily. 90 tablet 3   rosuvastatin  (CRESTOR ) 40 MG tablet Take 1 tablet (40 mg total) by mouth daily. 90 tablet 3   sucralfate  (CARAFATE ) 1 g tablet Take 1 tablet (1 g total) by mouth 4 (four) times daily. 120 tablet 0   tadalafil  (CIALIS ) 20  MG tablet TAKE 1 TABLET BY MOUTH EVERY DAY AS NEEDED FOR ERECTILE DYSFUNCTION 6 tablet 8   tamsulosin  (FLOMAX ) 0.4 MG CAPS capsule Take 1 capsule (0.4 mg total) by mouth daily. 90 capsule 3   temazepam  (RESTORIL ) 15 MG capsule TAKE 1 CAPSULE BY MOUTH AT BEDTIME AS NEEDED 60 capsule 2   tiZANidine  (ZANAFLEX ) 2 MG tablet TAKE 1 TABLET BY MOUTH TWICE DAILY AS NEEDED FOR MUSCLE SPASM 60 tablet 2   triamcinolone  (NASACORT ) 55 MCG/ACT AERO nasal inhaler Place 2 sprays into the nose daily. 1 each 12   No current facility-administered medications on file prior to visit.        ROS:  All others reviewed and negative.  Objective        PE:  BP 130/76 (BP Location: Left Arm, Patient Position: Sitting, Cuff Size: Normal)   Pulse 74   Temp  98.5 F (36.9 C) (Oral)   Ht 6' 4 (1.93 m)   Wt 175 lb (79.4 kg)   SpO2 99%   BMI 21.30 kg/m                 Constitutional: Pt appears in NAD               HENT: Head: NCAT.                Right Ear: External ear normal.                 Left Ear: External ear normal.                Eyes: . Pupils are equal, round, and reactive to light. Conjunctivae and EOM are normal               Nose: without d/c or deformity               Neck: Neck supple. Gross normal ROM               Cardiovascular: Normal rate and regular rhythm.                 Pulmonary/Chest: Effort normal and breath sounds without rales or wheezing.                Abd:  Soft, NT, ND, + BS, no organomegaly               Neurological: Pt is alert. At baseline orientation, motor grossly intact               Skin: Skin is warm. No rashes, no other new lesions, LE edema - ***               Psychiatric: Pt behavior is normal without agitation   Micro: none  Cardiac tracings I have personally interpreted today:  none  Pertinent Radiological findings (summarize): none   Lab Results  Component Value Date   WBC 5.0 03/26/2023   HGB 9.2 (L) 03/26/2023   HCT 28.7 (L) 03/26/2023   PLT 216.0  03/26/2023   GLUCOSE 73 03/26/2023   CHOL 144 03/26/2023   TRIG 103.0 03/26/2023   HDL 39.90 03/26/2023   LDLDIRECT 103.0 07/07/2021   LDLCALC 83 03/26/2023   ALT 8 03/26/2023   AST 12 03/26/2023   NA 143 03/26/2023   K 4.4 03/26/2023   CL 116 (H) 03/26/2023   CREATININE 2.51 (H) 03/26/2023   BUN 20 03/26/2023   CO2 19 03/26/2023   TSH 2.27 03/26/2023   PSA 6.23 (H) 03/26/2023   INR 1.2 09/29/2019   HGBA1C 4.0 (L) 03/26/2023   MICROALBUR 1.5 08/25/2015   Assessment/Plan:  Charles [name] is a 73 y.o. Black or African American [2] male with  has a past medical history of ABNORMAL ELECTROCARDIOGRAM (11/02/2008), ABSCESS, LUNG (10/23/2006), Anemia (05/26/2014), BACTERIAL PNEUMONIA (12/28/2009), BPH (benign prostatic hyperplasia) (11/22/2010), Cerebellar stroke (HCC) (05/26/2014), CHEST PAIN (12/24/2009), Coronary artery calcification seen on CT scan (06/01/2015), Cramp of limb (07/19/2007), DIVERTICULOSIS, COLON (03/07/2007), DVT (deep venous thrombosis) (HCC) (01/2014), Emphysema of lung (HCC), ERECTILE DYSFUNCTION (10/23/2006), FLANK PAIN, LEFT (02/25/2010), FREQUENCY, URINARY (12/24/2009), HEMORRHOIDS (10/23/2006), HYPERLIPIDEMIA (10/23/2006), HYPERTENSION (10/23/2006), PE (pulmonary embolism) (02/2014), PLANTAR FASCIITIS, LEFT (11/02/2008), Pneumonia (12/2013 X 2), PSA, INCREASED (11/20/2007), PULMONARY NODULE (05/14/2008), and Unspecified Peripheral Vascular Disease (10/23/2006).  No problem-specific Assessment & Plan notes found for this encounter.  Followup: No follow-ups on file.  Lynwood Rush, MD 08/14/2023 11:26 AM [facility] [place] [facility] - [facility] Internal Medicine

## 2023-08-14 NOTE — Patient Instructions (Signed)
 Please continue all other medications as before, and refills have been done if requested.  Please have the pharmacy call with any other refills you may need.  Please continue your efforts at being more active, low cholesterol diet, and weight control.  Please keep your appointments with your specialists as you may have planned - Dr Marlee renal  Please make an Appointment to return in 6 months, or sooner if needed

## 2023-08-15 ENCOUNTER — Encounter: Payer: Self-pay | Admitting: Internal Medicine

## 2023-08-15 DIAGNOSIS — D509 Iron deficiency anemia, unspecified: Secondary | ICD-10-CM | POA: Insufficient documentation

## 2023-08-15 NOTE — Assessment & Plan Note (Signed)
 Last vitamin D  Lab Results  Component Value Date   VD25OH 48.02 03/26/2023   Stable, cont oral replacement

## 2023-08-15 NOTE — Assessment & Plan Note (Signed)
 Persistent, d/w pt - declines GI referral or other such as CT abd pelvis

## 2023-08-15 NOTE — Assessment & Plan Note (Signed)
 Stable, cont PPI

## 2023-08-15 NOTE — Assessment & Plan Note (Signed)
 Lab Results  Component Value Date   HGBA1C 4.0 (L) 03/26/2023   Stable, pt to continue current medical treatment  - diet, wt control

## 2023-08-15 NOTE — Assessment & Plan Note (Signed)
 BP Readings from Last 3 Encounters:  08/14/23 130/76  03/26/23 132/70  12/13/22 (!) 156/75   Stable, pt to continue medical treatment norvasc  5 every day,

## 2023-08-15 NOTE — Assessment & Plan Note (Signed)
 Pt encouraged to continue abstinence

## 2023-08-15 NOTE — Assessment & Plan Note (Signed)
 Lab Results  Component Value Date   CREATININE 2.51 (H) 03/26/2023   Stable overall, cont to avoid nephrotoxins, f/u renal as planned

## 2023-09-03 DIAGNOSIS — H9211 Otorrhea, right ear: Secondary | ICD-10-CM | POA: Insufficient documentation

## 2023-09-03 DIAGNOSIS — H7291 Unspecified perforation of tympanic membrane, right ear: Secondary | ICD-10-CM | POA: Insufficient documentation

## 2023-09-20 ENCOUNTER — Other Ambulatory Visit: Payer: Self-pay | Admitting: Internal Medicine

## 2023-09-24 ENCOUNTER — Ambulatory Visit: Payer: Medicare Other | Admitting: Internal Medicine

## 2023-10-02 ENCOUNTER — Ambulatory Visit: Admitting: Internal Medicine

## 2023-10-28 ENCOUNTER — Other Ambulatory Visit: Payer: Self-pay | Admitting: Internal Medicine

## 2023-12-05 ENCOUNTER — Other Ambulatory Visit: Payer: Self-pay | Admitting: Nephrology

## 2023-12-05 DIAGNOSIS — N1832 Chronic kidney disease, stage 3b: Secondary | ICD-10-CM

## 2023-12-07 ENCOUNTER — Ambulatory Visit
Admission: RE | Admit: 2023-12-07 | Discharge: 2023-12-07 | Disposition: A | Discharge status: Home / Self Care | Source: Ambulatory Visit | Attending: Nephrology | Admitting: Nephrology

## 2023-12-07 DIAGNOSIS — N1832 Chronic kidney disease, stage 3b: Secondary | ICD-10-CM

## 2023-12-27 ENCOUNTER — Other Ambulatory Visit: Payer: Self-pay | Admitting: Internal Medicine

## 2024-03-03 ENCOUNTER — Other Ambulatory Visit: Payer: Self-pay | Admitting: Internal Medicine

## 2024-03-03 MED ORDER — TEMAZEPAM 15 MG PO CAPS: ORAL_CAPSULE | ORAL | 2 refills | Status: AC

## 2024-03-03 NOTE — Telephone Encounter (Signed)
 Copied from CRM #[id number]. Topic: Clinical - Medication Refill >> Mar 03, 2024 12:31 PM Viola F wrote: Medication: temazepam  (RESTORIL ) 15 MG capsule [[id number]]  Has the patient contacted their pharmacy? Yes (Agent: If no, request that the patient contact the pharmacy for the refill. If patient does not wish to contact the pharmacy document the reason why and proceed with request.) (Agent: If yes, when and what did the pharmacy advise?)  This is the patient's preferred pharmacy:   [phone], [place] - [address] [place] [address] Phone: [phone] Fax: [phone]  Is this the correct pharmacy for this prescription? Yes If no, delete pharmacy and type the correct one.   Has the prescription been filled recently? Yes  Is the patient out of the medication? No  Has the patient been seen for an appointment in the last year OR does the patient have an upcoming appointment? Yes  Can we respond through MyChart? Yes  Agent: Please be advised that Rx refills may take up to 3 business days. We ask that you follow-up with your pharmacy.

## 2024-03-11 ENCOUNTER — Telehealth: Payer: Self-pay

## 2024-03-11 ENCOUNTER — Encounter: Payer: Self-pay | Admitting: Internal Medicine

## 2024-03-11 ENCOUNTER — Ambulatory Visit: Admitting: Internal Medicine

## 2024-03-11 VITALS — BP 124/78 | HR 75 | Temp 98.2°F | Ht 76.0 in | Wt 169.0 lb

## 2024-03-11 DIAGNOSIS — N320 Bladder-neck obstruction: Secondary | ICD-10-CM | POA: Diagnosis not present

## 2024-03-11 DIAGNOSIS — E7800 Pure hypercholesterolemia, unspecified: Secondary | ICD-10-CM

## 2024-03-11 DIAGNOSIS — E538 Deficiency of other specified B group vitamins: Secondary | ICD-10-CM

## 2024-03-11 DIAGNOSIS — F1010 Alcohol abuse, uncomplicated: Secondary | ICD-10-CM | POA: Diagnosis not present

## 2024-03-11 DIAGNOSIS — K219 Gastro-esophageal reflux disease without esophagitis: Secondary | ICD-10-CM

## 2024-03-11 DIAGNOSIS — Z0001 Encounter for general adult medical examination with abnormal findings: Secondary | ICD-10-CM | POA: Insufficient documentation

## 2024-03-11 DIAGNOSIS — R739 Hyperglycemia, unspecified: Secondary | ICD-10-CM | POA: Diagnosis not present

## 2024-03-11 DIAGNOSIS — D509 Iron deficiency anemia, unspecified: Secondary | ICD-10-CM

## 2024-03-11 DIAGNOSIS — K2970 Gastritis, unspecified, without bleeding: Secondary | ICD-10-CM

## 2024-03-11 DIAGNOSIS — E559 Vitamin D deficiency, unspecified: Secondary | ICD-10-CM | POA: Diagnosis not present

## 2024-03-11 DIAGNOSIS — N184 Chronic kidney disease, stage 4 (severe): Secondary | ICD-10-CM

## 2024-03-11 DIAGNOSIS — K922 Gastrointestinal hemorrhage, unspecified: Secondary | ICD-10-CM

## 2024-03-11 LAB — URINALYSIS, ROUTINE W REFLEX MICROSCOPIC
Bilirubin Urine: NEGATIVE
Hgb urine dipstick: NEGATIVE
Ketones, ur: NEGATIVE
Leukocytes,Ua: NEGATIVE
Nitrite: NEGATIVE
RBC / HPF: NONE SEEN
Specific Gravity, Urine: 1.015 (ref 1.000–1.030)
Urine Glucose: NEGATIVE
Urobilinogen, UA: 0.2 (ref 0.0–1.0)
WBC, UA: NONE SEEN
pH: 6 (ref 5.0–8.0)

## 2024-03-11 LAB — BASIC METABOLIC PANEL WITH GFR
BUN: 27 mg/dL — ABNORMAL HIGH (ref 6–23)
CO2: 21 meq/L (ref 19–32)
Calcium: 8.2 mg/dL — ABNORMAL LOW (ref 8.4–10.5)
Chloride: 114 meq/L — ABNORMAL HIGH (ref 96–112)
Creatinine, Ser: 2.49 mg/dL — ABNORMAL HIGH (ref 0.40–1.50)
GFR: 25.33 mL/min — ABNORMAL LOW
Glucose, Bld: 97 mg/dL (ref 70–99)
Potassium: 4.3 meq/L (ref 3.5–5.1)
Sodium: 142 meq/L (ref 135–145)

## 2024-03-11 LAB — CBC WITH DIFFERENTIAL/PLATELET
Basophils Absolute: 0.1 K/uL (ref 0.0–0.1)
Basophils Relative: 0.7 % (ref 0.0–3.0)
Eosinophils Absolute: 0.1 K/uL (ref 0.0–0.7)
Eosinophils Relative: 0.7 % (ref 0.0–5.0)
HCT: 30.1 % — ABNORMAL LOW (ref 39.0–52.0)
Hemoglobin: 10.1 g/dL — ABNORMAL LOW (ref 13.0–17.0)
Lymphocytes Relative: 23.1 % (ref 12.0–46.0)
Lymphs Abs: 1.9 K/uL (ref 0.7–4.0)
MCHC: 33.5 g/dL (ref 30.0–36.0)
MCV: 94.5 fl (ref 78.0–100.0)
Monocytes Absolute: 0.7 K/uL (ref 0.1–1.0)
Monocytes Relative: 8.6 % (ref 3.0–12.0)
Neutro Abs: 5.6 K/uL (ref 1.4–7.7)
Neutrophils Relative %: 66.9 % (ref 43.0–77.0)
Platelets: 222 K/uL (ref 150.0–400.0)
RBC: 3.19 Mil/uL — ABNORMAL LOW (ref 4.22–5.81)
RDW: 14.7 % (ref 11.5–15.5)
WBC: 8.4 K/uL (ref 4.0–10.5)

## 2024-03-11 LAB — LIPID PANEL
Cholesterol: 129 mg/dL (ref 28–200)
HDL: 37.7 mg/dL — ABNORMAL LOW
LDL Cholesterol: 67 mg/dL (ref 10–99)
NonHDL: 91.18
Total CHOL/HDL Ratio: 3
Triglycerides: 119 mg/dL (ref 10.0–149.0)
VLDL: 23.8 mg/dL (ref 0.0–40.0)

## 2024-03-11 LAB — HEPATIC FUNCTION PANEL
ALT: 9 U/L (ref 3–53)
AST: 12 U/L (ref 5–37)
Albumin: 3.6 g/dL (ref 3.5–5.2)
Alkaline Phosphatase: 28 U/L — ABNORMAL LOW (ref 39–117)
Bilirubin, Direct: 0.1 mg/dL (ref 0.1–0.3)
Total Bilirubin: 0.4 mg/dL (ref 0.2–1.2)
Total Protein: 6.4 g/dL (ref 6.0–8.3)

## 2024-03-11 LAB — IBC PANEL
Iron: 45 ug/dL (ref 42–165)
Saturation Ratios: 16.6 % — ABNORMAL LOW (ref 20.0–50.0)
TIBC: 271.6 ug/dL (ref 250.0–450.0)
Transferrin: 194 mg/dL — ABNORMAL LOW (ref 212.0–360.0)

## 2024-03-11 LAB — VITAMIN B12: Vitamin B-12: >1500 pg/mL — ABNORMAL HIGH (ref 211–911)

## 2024-03-11 LAB — HEMOGLOBIN A1C: Hgb A1c MFr Bld: 4.4 % — ABNORMAL LOW (ref 4.6–6.5)

## 2024-03-11 LAB — TSH: TSH: 2.27 u[IU]/mL (ref 0.35–5.50)

## 2024-03-11 LAB — PSA: PSA: 7.27 ng/mL — ABNORMAL HIGH (ref 0.10–4.00)

## 2024-03-11 LAB — FERRITIN: Ferritin: 59.8 ng/mL (ref 22.0–322.0)

## 2024-03-11 LAB — VITAMIN D 25 HYDROXY (VIT D DEFICIENCY, FRACTURES): VITD: 37.94 ng/mL (ref 30.00–100.00)

## 2024-03-11 MED ORDER — AMLODIPINE BESYLATE 5 MG PO TABS: 5.0000 mg | ORAL_TABLET | Freq: Every day | ORAL | 3 refills | Status: AC

## 2024-03-11 MED ORDER — EZETIMIBE 10 MG PO TABS: 10.0000 mg | ORAL_TABLET | Freq: Every day | ORAL | 3 refills | Status: AC

## 2024-03-11 MED ORDER — PANTOPRAZOLE SODIUM 40 MG PO TBEC: 40.0000 mg | DELAYED_RELEASE_TABLET | Freq: Every day | ORAL | 3 refills | Status: DC

## 2024-03-11 MED ORDER — SUCRALFATE 1 G PO TABS: 1.0000 g | ORAL_TABLET | Freq: Four times a day (QID) | ORAL | 0 refills | Status: DC

## 2024-03-11 MED ORDER — TAMSULOSIN HCL 0.4 MG PO CAPS: 0.4000 mg | ORAL_CAPSULE | Freq: Every day | ORAL | 3 refills | Status: AC

## 2024-03-11 MED ORDER — ROSUVASTATIN CALCIUM 40 MG PO TABS: 40.0000 mg | ORAL_TABLET | Freq: Every day | ORAL | 3 refills | Status: AC

## 2024-03-11 NOTE — Assessment & Plan Note (Signed)
Also for iron with labs,  to f/u any worsening symptoms or concerns 

## 2024-03-11 NOTE — Progress Notes (Signed)
 Patient ID: Charles [name], male   DOB: [birth year], 74 y.o.   MRN: [id number]         Chief Complaint:: wellness exam and gastritis gerd after alcohol bender with black stools for 2 days last wk.         HPI:  Charles [name] is a 74 y.o. male here for wellness exam; fpr tdap and shingrix at pharmacy, o/w up to date                        Also had an unfortunate episode of binge drinking 1 wk ago associated since then with epigastric pain, worsening reflux, and several black stools resolved after 2 days.  Had similar episode last yr and did well with PPI and carafate , pt is asking for restart.  No n/v and has been taking po ok. Pt denies chest pain, increased sob or doe, wheezing, orthopnea, PND, increased LE swelling, palpitations, dizziness or syncope.   Pt denies polydipsia, polyuria, or new focal neuro s/s.    Pt denies fever, wt loss, night sweats, loss of appetite, or other constitutional symptoms     Wt Readings from Last 3 Encounters:  03/11/24 169 lb (76.7 kg)  08/14/23 175 lb (79.4 kg)  05/15/23 180 lb (81.6 kg)   BP Readings from Last 3 Encounters:  03/11/24 124/78  08/14/23 130/76  03/26/23 132/70   Immunization History  Administered Date(s) Administered   Moderna Sars-Covid-2 Vaccination 04/14/2019, 05/13/2019, 02/09/2020   PNEUMOCOCCAL CONJUGATE-20 09/08/2020   Td 02/20/1998, 11/02/2008   Health Maintenance Due  Topic Date Due   Zoster Vaccines- Shingrix (1 of 2) Never done   DTaP/Tdap/Td (3 - Tdap) 11/03/2018      Past Medical History:  Diagnosis Date   ABNORMAL ELECTROCARDIOGRAM 11/02/2008   ABSCESS, LUNG 10/23/2006   Anemia 05/26/2014   BACTERIAL PNEUMONIA 12/28/2009   BPH (benign prostatic hyperplasia) 11/22/2010   Cerebellar stroke (HCC) 05/26/2014   CHEST PAIN 12/24/2009   Coronary artery calcification seen on CT scan 06/01/2015   Cramp of limb 07/19/2007   DIVERTICULOSIS, COLON 03/07/2007   DVT (deep venous thrombosis) (HCC) 01/2014   LLE   Emphysema of lung (HCC)     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/23/2006   FLANK PAIN, LEFT 02/25/2010   FREQUENCY, URINARY 12/24/2009   HEMORRHOIDS 10/23/2006   HYPERLIPIDEMIA 10/23/2006   HYPERTENSION 10/23/2006   PE (pulmonary embolism) 02/2014   PLANTAR FASCIITIS, LEFT 11/02/2008   Pneumonia 12/2013 X 2   PSA, INCREASED 11/20/2007   PULMONARY NODULE 05/14/2008   Unspecified Peripheral Vascular Disease 10/23/2006   Past Surgical History:  Procedure Laterality Date   BIOPSY  09/29/2019   Procedure: BIOPSY;  Surgeon: Albertus Gordy HERO, MD;  [facility];  Service: Endoscopy;;   BIOPSY  12/25/2019   Procedure: BIOPSY;  Surgeon: Teressa Toribio SQUIBB, MD;  [facility];  Service: Endoscopy;;   COLONOSCOPY     ESOPHAGOGASTRODUODENOSCOPY (EGD) WITH PROPOFOL  N/A 06/15/2017   Procedure: ESOPHAGOGASTRODUODENOSCOPY (EGD) WITH PROPOFOL ;  Surgeon: Albertus Gordy HERO, MD;  Location: [facility];  Service: Gastroenterology;  Laterality: N/A;   ESOPHAGOGASTRODUODENOSCOPY (EGD) WITH PROPOFOL  N/A 09/29/2019   Procedure: ESOPHAGOGASTRODUODENOSCOPY (EGD) WITH PROPOFOL ;  Surgeon: Albertus Gordy HERO, MD;  [facility];  Service: Endoscopy;  Laterality: N/A;   ESOPHAGOGASTRODUODENOSCOPY (EGD) WITH PROPOFOL  N/A 12/25/2019   Procedure: ESOPHAGOGASTRODUODENOSCOPY (EGD) WITH PROPOFOL ;  Surgeon: Teressa Toribio SQUIBB, MD;  [facility];  Service: Endoscopy;  Laterality: N/A;   EUS N/A  12/25/2019   Procedure: UPPER ENDOSCOPIC ULTRASOUND (EUS) RADIAL;  Surgeon: Teressa Toribio SQUIBB, MD;  [facility];  Service: Endoscopy;  Laterality: N/A;   HEMORRHOID SURGERY  ?1990's   INGUINAL HERNIA REPAIR Bilateral ?2000's   IVC FILTER INSERTION N/A 10/03/2016   Procedure: IVC Filter Retrieveal;  Surgeon: Serene Gaile ORN, MD;  [facility];  Service: Cardiovascular;  Laterality: N/A;   MYRINGOTOMY WITH TUBE PLACEMENT Right 06/26/2018   SHOULDER ARTHROSCOPY W/ ROTATOR CUFF REPAIR Right 11/2013   STAPLING OF BLEBS Right 07/31/2016    Procedure: BLEBECTOMY;  Surgeon: Kerrin Elspeth BROCKS, MD;  Location: [facility] OR;  Service: Thoracic;  Laterality: Right;   VENA CAVA FILTER PLACEMENT Right 08/04/2016   Procedure: INSERTION VENA-CAVA FILTER;  Surgeon: Kerrin Elspeth BROCKS, MD;  Location: [facility] OR;  Service: Thoracic;  Laterality: Right;   VIDEO ASSISTED THORACOSCOPY Right 07/31/2016   Procedure: VIDEO ASSISTED THORACOSCOPY;  Surgeon: Kerrin Elspeth BROCKS, MD;  Location: [facility] OR;  Service: Thoracic;  Laterality: Right;   VIDEO ASSISTED THORACOSCOPY Right 08/04/2016   Procedure: REDO VIDEO ASSISTED THORACOSCOPY- EVACUATION OF HEMOTHORAX;  Surgeon: Kerrin Elspeth BROCKS, MD;  Location[facility];  Service: Thoracic;  Laterality: Right;   VIDEO BRONCHOSCOPY WITH ENDOBRONCHIAL NAVIGATION N/A 03/11/2014   Procedure: VIDEO BRONCHOSCOPY WITH ENDOBRONCHIAL NAVIGATION;  Surgeon: Lamar GORMAN Chris, MD;  Location: [facility];  Service: Thoracic;  Laterality: N/A;    reports that he has quit smoking. His smoking use included cigarettes. He has a 15 pack-year smoking history. He has been exposed to tobacco smoke. He has never used smokeless tobacco. He reports that he does not currently use alcohol. He reports current drug use. Drug: Marijuana. family history includes Heart disease in his father and mother; Kidney disease in his sister. Allergies[1] Medications Ordered Prior to Encounter[2]      ROS:  All others reviewed and negative.  Objective        PE:  BP 124/78 (BP Location: Left Arm, Patient Position: Sitting, Cuff Size: Normal)   Pulse 75   Temp 98.2 F (36.8 C) (Oral)   Ht 6' 4 (1.93 m)   Wt 169 lb (76.7 kg)   SpO2 100%   BMI 20.57 kg/m                 Constitutional: Pt appears in NAD               HENT: Head: NCAT.                Right Ear: External ear normal.                 Left Ear: External ear normal.                Eyes: . Pupils are equal, round, and reactive to light. Conjunctivae and EOM are normal               Nose: without  d/c or deformity               Neck: Neck supple. Gross normal ROM               Cardiovascular: Normal rate and regular rhythm.                 Pulmonary/Chest: Effort normal and breath sounds without rales or wheezing.                Abd:  Soft, mild epigastric tender, ND, + BS,  no organomegaly               Neurological: Pt is alert. At baseline orientation, motor grossly intact               Skin: Skin is warm. No rashes, no other new lesions, LE edema - none               Psychiatric: Pt behavior is normal without agitation   Micro: none  Cardiac tracings I have personally interpreted today:  none  Pertinent Radiological findings (summarize): none   Lab Results  Component Value Date   WBC 8.4 03/11/2024   HGB 10.1 (L) 03/11/2024   HCT 30.1 (L) 03/11/2024   PLT 222.0 03/11/2024   GLUCOSE 97 03/11/2024   CHOL 129 03/11/2024   TRIG 119.0 03/11/2024   HDL 37.70 (L) 03/11/2024   LDLDIRECT 103.0 07/07/2021   LDLCALC 67 03/11/2024   ALT 9 03/11/2024   AST 12 03/11/2024   NA 142 03/11/2024   K 4.3 03/11/2024   CL 114 (H) 03/11/2024   CREATININE 2.49 (H) 03/11/2024   BUN 27 (H) 03/11/2024   CO2 21 03/11/2024   TSH 2.27 03/11/2024   PSA 7.27 (H) 03/11/2024   INR 1.2 09/29/2019   HGBA1C 4.4 (L) 03/11/2024   Assessment/Plan:  Charles [name] is a 74 y.o. Black or African American [2] male with  has a past medical history of ABNORMAL ELECTROCARDIOGRAM (11/02/2008), ABSCESS, LUNG (10/23/2006), Anemia (05/26/2014), BACTERIAL PNEUMONIA (12/28/2009), BPH (benign prostatic hyperplasia) (11/22/2010), Cerebellar stroke (HCC) (05/26/2014), CHEST PAIN (12/24/2009), Coronary artery calcification seen on CT scan (06/01/2015), Cramp of limb (07/19/2007), DIVERTICULOSIS, COLON (03/07/2007), DVT (deep venous thrombosis) (HCC) (01/2014), Emphysema of lung (HCC), ERECTILE DYSFUNCTION (10/23/2006), FLANK PAIN, LEFT (02/25/2010), FREQUENCY, URINARY (12/24/2009), HEMORRHOIDS (10/23/2006), HYPERLIPIDEMIA (10/23/2006),  HYPERTENSION (10/23/2006), PE (pulmonary embolism) (02/2014), PLANTAR FASCIITIS, LEFT (11/02/2008), Pneumonia (12/2013 X 2), PSA, INCREASED (11/20/2007), PULMONARY NODULE (05/14/2008), and Unspecified Peripheral Vascular Disease (10/23/2006).  Encounter for well adult exam with abnormal findings Age and sex appropriate education and counseling updated with regular exercise and diet Referrals for preventative services - none needed Immunizations addressed - for tdap and shignrix at pharmacy Smoking counseling  - none needed Evidence for depression or other mood disorder - none significant Most recent labs reviewed. I have personally reviewed and have noted: 1) the patient's medical and social history 2) The patient's current medications and supplements 3) The patient's height, weight, and BMI have been recorded in the chart   Alcohol abuse Pt encouraged to abstain  Vitamin D  deficiency Last vitamin D  Lab Results  Component Value Date   VD25OH 37.94 03/11/2024   Low, to start oral replacement   Hyperlipidemia Lab Results  Component Value Date   LDLCALC 67 03/11/2024   Stable, pt to continue current statin crestor  40 mg qd   Hyperglycemia Lab Results  Component Value Date   HGBA1C 4.4 (L) 03/11/2024   Stable, pt to continue current medical treatment  - diet, wt control   GERD Mild to mod, for restart protonix  40 mg every day,  to f/u any worsening symptoms or concerns   Gastritis and gastroduodenitis Presumed, for carafate  1 gm qid, declines GI referral for now  CKD (chronic kidney disease) stage 4, GFR 15-29 ml/min (HCC) Lab Results  Component Value Date   CREATININE 2.49 (H) 03/11/2024   Stable overall, cont to avoid nephrotoxins   Acute upper GI bleed Recent,likely recent ETOH related gastritis, for tx as above, declines GI  referral, also cbc with labs  Iron deficiency anemia Also for iron with labs,  to f/u any worsening symptoms or concerns  Followup: Return  in about 6 months (around 09/08/2024).  Lynwood Rush, MD 03/11/2024 8:32 PM [facility] [place] [facility] - [facility] Internal Medicine     [1]  Allergies Allergen Reactions   Pylera [Bis Subcit-Metronid-Tetracyc]     Itching   [2]  Current Outpatient Medications on File Prior to Visit  Medication Sig Dispense Refill   losartan (COZAAR) 25 MG tablet Take 25 mg by mouth at bedtime.     albuterol  (VENTOLIN  HFA) 108 (90 Base) MCG/ACT inhaler Inhale 2 puffs into the lungs every 6 (six) hours as needed for wheezing or shortness of breath. 8 g 5   chlorproMAZINE  (THORAZINE ) 10 MG tablet Take 1 tablet (10 mg total) by mouth 3 (three) times daily as needed for up to 10 doses. To help with hiccups 10 tablet 0   ELIQUIS  2.5 MG TABS tablet TAKE 1 TABLET TWICE A DAY 180 tablet 3   fexofenadine  (ALLEGRA ) 180 MG tablet Take 1 tablet (180 mg total) by mouth daily. 30 tablet 2   guaiFENesin  (MUCINEX ) 600 MG 12 hr tablet Take 2 tablets (1,200 mg total) by mouth 2 (two) times daily as needed. 60 tablet 1   hydrOXYzine  (ATARAX /VISTARIL ) 10 MG tablet Take 1 tablet (10 mg total) by mouth 3 (three) times daily as needed for itching. 60 tablet 1   Multiple Vitamins-Minerals (MULTIVITAMIN WITH MINERALS) tablet Take 1 tablet by mouth daily. Men 50 +     ondansetron  (ZOFRAN ) 4 MG tablet Take 1 tablet (4 mg total) by mouth every 6 (six) hours. 12 tablet 0   tadalafil  (CIALIS ) 20 MG tablet TAKE 1 TABLET BY MOUTH EVERY DAY AS NEEDED FOR ERECTILE DYSFUNCTION 6 tablet 8   temazepam  (RESTORIL ) 15 MG capsule TAKE 1 CAPSULE BY MOUTH AT BEDTIME AS NEEDED 60 capsule 2   triamcinolone  (NASACORT ) 55 MCG/ACT AERO nasal inhaler Place 2 sprays into the nose daily. 1 each 12   No current facility-administered medications on file prior to visit.

## 2024-03-11 NOTE — Assessment & Plan Note (Signed)
 Age and sex appropriate education and counseling updated with regular exercise and diet Referrals for preventative services - none needed Immunizations addressed - for tdap and shignrix at pharmacy Smoking counseling  - none needed Evidence for depression or other mood disorder - none significant Most recent labs reviewed. I have personally reviewed and have noted: 1) the patient's medical and social history 2) The patient's current medications and supplements 3) The patient's height, weight, and BMI have been recorded in the chart

## 2024-03-11 NOTE — Assessment & Plan Note (Signed)
 Mild to mod, for restart protonix  40 mg every day,  to f/u any worsening symptoms or concerns

## 2024-03-11 NOTE — Assessment & Plan Note (Signed)
 Last vitamin D  Lab Results  Component Value Date   VD25OH 37.94 03/11/2024   Low, to start oral replacement

## 2024-03-11 NOTE — Patient Instructions (Signed)
 Please take all new medication as prescribed  - the carafate  and protonix  for the stomach  Please continue all other medications as before, and refills have been done if requested.  Please have the pharmacy call with any other refills you may need.  Please continue your efforts at being more active, low cholesterol diet, and weight control.  You are otherwise up to date with prevention measures today.  Please keep your appointments with your specialists as you may have planned  Please go to the LAB at the blood drawing area for the tests to be done  You will be contacted by phone if any changes need to be made immediately.  Otherwise, you will receive a letter about your results with an explanation, but please check with MyChart first.  Please make an Appointment to return in 6 months, or sooner if needed

## 2024-03-11 NOTE — Assessment & Plan Note (Signed)
 Lab Results  Component Value Date   CREATININE 2.49 (H) 03/11/2024   Stable overall, cont to avoid nephrotoxins

## 2024-03-11 NOTE — Assessment & Plan Note (Signed)
 Recent,likely recent ETOH related gastritis, for tx as above, declines GI referral, also cbc with labs

## 2024-03-11 NOTE — Assessment & Plan Note (Signed)
 Lab Results  Component Value Date   HGBA1C 4.4 (L) 03/11/2024   Stable, pt to continue current medical treatment  - diet, wt control

## 2024-03-11 NOTE — Assessment & Plan Note (Signed)
 Lab Results  Component Value Date   LDLCALC 67 03/11/2024   Stable, pt to continue current statin crestor  40 mg qd

## 2024-03-11 NOTE — Assessment & Plan Note (Signed)
 Presumed, for carafate  1 gm qid, declines GI referral for now

## 2024-03-11 NOTE — Telephone Encounter (Signed)
 Copied from CRM #[id number]. Topic: Clinical - Prescription Issue >> Mar 11, 2024  3:47 PM Alexandria E wrote: Reason for CRM: Patient is needing his sucralfate  (CARAFATE ) 1 g tablet and pantoprazole  (PROTONIX ) 40 MG tablet called into [facility] [address] instead of through [employer].

## 2024-03-11 NOTE — Assessment & Plan Note (Signed)
Pt encouraged to abstain

## 2024-03-12 ENCOUNTER — Ambulatory Visit: Payer: Self-pay | Admitting: Internal Medicine

## 2024-03-12 ENCOUNTER — Other Ambulatory Visit: Payer: Self-pay | Admitting: Internal Medicine

## 2024-03-12 ENCOUNTER — Telehealth: Payer: Self-pay

## 2024-03-12 DIAGNOSIS — R972 Elevated prostate specific antigen [PSA]: Secondary | ICD-10-CM

## 2024-03-12 NOTE — Telephone Encounter (Signed)
 Copied from CRM #[id number]. Topic: Clinical - Prescription Issue >> Mar 12, 2024 11:47 AM Alfonso HERO wrote: Reason for CRM: patient calling for status of [facility]. Asking for a call back.

## 2024-03-13 ENCOUNTER — Other Ambulatory Visit: Payer: Self-pay

## 2024-03-13 ENCOUNTER — Telehealth: Payer: Self-pay

## 2024-03-13 DIAGNOSIS — K219 Gastro-esophageal reflux disease without esophagitis: Secondary | ICD-10-CM

## 2024-03-13 MED ORDER — SUCRALFATE 1 G PO TABS: 1.0000 g | ORAL_TABLET | Freq: Four times a day (QID) | ORAL | 0 refills | Status: DC

## 2024-03-13 MED ORDER — PANTOPRAZOLE SODIUM 40 MG PO TBEC: 40.0000 mg | DELAYED_RELEASE_TABLET | Freq: Every day | ORAL | 0 refills | Status: DC

## 2024-03-13 NOTE — Telephone Encounter (Signed)
 Copied from CRM [id number]. Topic: Clinical - Medication Refill >> Mar 13, 2024  9:46 AM Avram MATSU wrote: Medication: pantoprazole  (PROTONIX ) 40 MG tablet [[id number]]  Has the patient contacted their pharmacy? Yes (Agent: If no, request that the patient contact the pharmacy for the refill. If patient does not wish to contact the pharmacy document the reason why and proceed with request.) (Agent: If yes, when and what did the pharmacy advise?)  This is the patient's preferred pharmacy:  [phone] [address] [place] - [address] [place] [address] Phone: [phone] Fax: [phone]   Is this the correct pharmacy for this prescription? Yes If no, delete pharmacy and type the correct one.   Has the prescription been filled recently? no  Is the patient out of the medication? Yes  Has the patient been seen for an appointment in the last year OR does the patient have an upcoming appointment? Yes  Can we respond through MyChart? No call  Agent: Please be advised that Rx refills may take up to 3 business days. We ask that you follow-up with your pharmacy.

## 2024-03-14 ENCOUNTER — Telehealth: Payer: Self-pay | Admitting: Internal Medicine

## 2024-03-14 NOTE — Telephone Encounter (Signed)
 The refills were sent in yesterday.

## 2024-03-14 NOTE — Telephone Encounter (Signed)
 Patient requests carafate  and protonix  from [facility] [address]

## 2024-03-14 NOTE — Telephone Encounter (Signed)
 Patient requests to speak with Clinical Supervisor

## 2024-03-14 NOTE — Telephone Encounter (Signed)
Refill has been sent yesterday.

## 2024-03-17 ENCOUNTER — Other Ambulatory Visit: Payer: Self-pay

## 2024-03-17 DIAGNOSIS — K219 Gastro-esophageal reflux disease without esophagitis: Secondary | ICD-10-CM

## 2024-03-17 MED ORDER — SUCRALFATE 1 G PO TABS: 1.0000 g | ORAL_TABLET | Freq: Four times a day (QID) | ORAL | 0 refills | Status: AC

## 2024-03-17 MED ORDER — PANTOPRAZOLE SODIUM 40 MG PO TBEC: 40.0000 mg | DELAYED_RELEASE_TABLET | Freq: Every day | ORAL | 0 refills | Status: AC

## 2024-03-17 NOTE — Telephone Encounter (Signed)
 Medication has been sent in to pts pharmacy as requested.

## 2024-05-21 ENCOUNTER — Ambulatory Visit

## 2024-07-25 ENCOUNTER — Encounter: Admitting: Nurse Practitioner
